# Patient Record
Sex: Female | Born: 1940 | Race: White | Hispanic: No | Marital: Married | Smoking: Former smoker
Health system: Southern US, Community
[De-identification: ages and names within clinical notes are randomized; demographics above are authoritative.]

## PROBLEM LIST (undated history)

## (undated) DIAGNOSIS — R600 Localized edema: Secondary | ICD-10-CM

## (undated) DIAGNOSIS — N3281 Overactive bladder: Secondary | ICD-10-CM

## (undated) DIAGNOSIS — Z859 Personal history of malignant neoplasm, unspecified: Secondary | ICD-10-CM

## (undated) DIAGNOSIS — R0681 Apnea, not elsewhere classified: Secondary | ICD-10-CM

## (undated) DIAGNOSIS — G2581 Restless legs syndrome: Secondary | ICD-10-CM

## (undated) DIAGNOSIS — N393 Stress incontinence (female) (male): Secondary | ICD-10-CM

## (undated) DIAGNOSIS — R011 Cardiac murmur, unspecified: Secondary | ICD-10-CM

## (undated) DIAGNOSIS — I739 Peripheral vascular disease, unspecified: Secondary | ICD-10-CM

## (undated) DIAGNOSIS — I872 Venous insufficiency (chronic) (peripheral): Secondary | ICD-10-CM

## (undated) DIAGNOSIS — N840 Polyp of corpus uteri: Secondary | ICD-10-CM

## (undated) DIAGNOSIS — M549 Dorsalgia, unspecified: Secondary | ICD-10-CM

## (undated) DIAGNOSIS — J309 Allergic rhinitis, unspecified: Secondary | ICD-10-CM

## (undated) DIAGNOSIS — I452 Bifascicular block: Secondary | ICD-10-CM

## (undated) DIAGNOSIS — T4145XA Adverse effect of unspecified anesthetic, initial encounter: Secondary | ICD-10-CM

## (undated) DIAGNOSIS — Z9889 Other specified postprocedural states: Secondary | ICD-10-CM

## (undated) DIAGNOSIS — I10 Essential (primary) hypertension: Secondary | ICD-10-CM

## (undated) DIAGNOSIS — M1990 Unspecified osteoarthritis, unspecified site: Secondary | ICD-10-CM

## (undated) DIAGNOSIS — Z860101 Personal history of adenomatous and serrated colon polyps: Secondary | ICD-10-CM

## (undated) DIAGNOSIS — Z86010 Personal history of colonic polyps: Secondary | ICD-10-CM

## (undated) DIAGNOSIS — M79606 Pain in leg, unspecified: Secondary | ICD-10-CM

## (undated) DIAGNOSIS — I779 Disorder of arteries and arterioles, unspecified: Secondary | ICD-10-CM

## (undated) DIAGNOSIS — E785 Hyperlipidemia, unspecified: Secondary | ICD-10-CM

## (undated) DIAGNOSIS — E559 Vitamin D deficiency, unspecified: Secondary | ICD-10-CM

## (undated) DIAGNOSIS — M069 Rheumatoid arthritis, unspecified: Secondary | ICD-10-CM

## (undated) DIAGNOSIS — R0602 Shortness of breath: Secondary | ICD-10-CM

## (undated) DIAGNOSIS — T8859XA Other complications of anesthesia, initial encounter: Secondary | ICD-10-CM

## (undated) DIAGNOSIS — K219 Gastro-esophageal reflux disease without esophagitis: Secondary | ICD-10-CM

## (undated) HISTORY — PX: POSTERIOR LUMBAR FUSION: SHX6036

## (undated) HISTORY — PX: APPENDECTOMY: SHX54

## (undated) HISTORY — DX: Venous insufficiency (chronic) (peripheral): I87.2

## (undated) HISTORY — DX: Pain in leg, unspecified: M79.606

## (undated) HISTORY — DX: Rheumatoid arthritis, unspecified: M06.9

## (undated) HISTORY — DX: Disorder of arteries and arterioles, unspecified: I77.9

## (undated) HISTORY — DX: Vitamin D deficiency, unspecified: E55.9

## (undated) HISTORY — DX: Restless legs syndrome: G25.81

## (undated) HISTORY — PX: OTHER SURGICAL HISTORY: SHX169

## (undated) HISTORY — DX: Dorsalgia, unspecified: M54.9

## (undated) HISTORY — DX: Essential (primary) hypertension: I10

## (undated) HISTORY — PX: ULNAR NERVE TRANSPOSITION: SHX2595

## (undated) HISTORY — PX: TONSILLECTOMY: SUR1361

## (undated) HISTORY — DX: Cardiac murmur, unspecified: R01.1

## (undated) HISTORY — DX: Shortness of breath: R06.02

## (undated) HISTORY — DX: Hyperlipidemia, unspecified: E78.5

## (undated) HISTORY — PX: SKIN CANCER EXCISION: SHX779

## (undated) HISTORY — DX: Apnea, not elsewhere classified: R06.81

## (undated) HISTORY — PX: COLONOSCOPY: SHX174

## (undated) HISTORY — PX: CARDIAC CATHETERIZATION: SHX172

## (undated) HISTORY — DX: Peripheral vascular disease, unspecified: I73.9

---

## 1998-04-22 ENCOUNTER — Other Ambulatory Visit: Admission: RE | Admit: 1998-04-22 | Discharge: 1998-04-22 | Payer: Self-pay | Admitting: *Deleted

## 1998-06-07 ENCOUNTER — Ambulatory Visit (HOSPITAL_COMMUNITY): Admission: RE | Admit: 1998-06-07 | Discharge: 1998-06-07 | Payer: Self-pay | Admitting: Obstetrics and Gynecology

## 1999-06-01 ENCOUNTER — Encounter: Payer: Self-pay | Admitting: Emergency Medicine

## 1999-06-01 ENCOUNTER — Emergency Department (HOSPITAL_COMMUNITY): Admission: EM | Admit: 1999-06-01 | Discharge: 1999-06-01 | Payer: Self-pay | Admitting: Emergency Medicine

## 1999-06-03 ENCOUNTER — Encounter: Admission: RE | Admit: 1999-06-03 | Discharge: 1999-06-03 | Payer: Self-pay | Admitting: Internal Medicine

## 1999-06-03 ENCOUNTER — Encounter: Payer: Self-pay | Admitting: Internal Medicine

## 1999-07-28 ENCOUNTER — Other Ambulatory Visit: Admission: RE | Admit: 1999-07-28 | Discharge: 1999-07-28 | Payer: Self-pay | Admitting: Obstetrics and Gynecology

## 2002-01-31 ENCOUNTER — Encounter: Payer: Self-pay | Admitting: General Surgery

## 2002-01-31 ENCOUNTER — Encounter: Admission: RE | Admit: 2002-01-31 | Discharge: 2002-01-31 | Payer: Self-pay | Admitting: General Surgery

## 2002-02-02 ENCOUNTER — Encounter (INDEPENDENT_AMBULATORY_CARE_PROVIDER_SITE_OTHER): Payer: Self-pay | Admitting: *Deleted

## 2002-02-02 ENCOUNTER — Ambulatory Visit (HOSPITAL_BASED_OUTPATIENT_CLINIC_OR_DEPARTMENT_OTHER): Admission: RE | Admit: 2002-02-02 | Discharge: 2002-02-02 | Payer: Self-pay | Admitting: General Surgery

## 2004-11-24 ENCOUNTER — Ambulatory Visit: Payer: Self-pay | Admitting: Internal Medicine

## 2004-11-28 ENCOUNTER — Ambulatory Visit: Payer: Self-pay | Admitting: Internal Medicine

## 2005-01-22 ENCOUNTER — Ambulatory Visit: Payer: Self-pay | Admitting: Internal Medicine

## 2005-01-23 ENCOUNTER — Ambulatory Visit: Payer: Self-pay | Admitting: Internal Medicine

## 2005-07-02 ENCOUNTER — Encounter (INDEPENDENT_AMBULATORY_CARE_PROVIDER_SITE_OTHER): Payer: Self-pay | Admitting: Specialist

## 2005-07-03 ENCOUNTER — Inpatient Hospital Stay (HOSPITAL_COMMUNITY): Admission: EM | Admit: 2005-07-03 | Discharge: 2005-07-04 | Payer: Self-pay | Admitting: Emergency Medicine

## 2005-10-28 ENCOUNTER — Ambulatory Visit: Payer: Self-pay | Admitting: Internal Medicine

## 2005-12-23 ENCOUNTER — Ambulatory Visit: Payer: Self-pay | Admitting: Internal Medicine

## 2006-05-19 ENCOUNTER — Ambulatory Visit: Payer: Self-pay | Admitting: Internal Medicine

## 2006-05-19 LAB — CONVERTED CEMR LAB
ALT: 34 units/L (ref 0–40)
AST: 24 units/L (ref 0–37)
Creatinine, Ser: 0.6 mg/dL (ref 0.4–1.2)
Potassium: 4 meq/L (ref 3.5–5.1)

## 2006-06-03 ENCOUNTER — Ambulatory Visit: Payer: Self-pay | Admitting: Internal Medicine

## 2006-08-02 ENCOUNTER — Other Ambulatory Visit: Admission: RE | Admit: 2006-08-02 | Discharge: 2006-08-02 | Payer: Self-pay | Admitting: Obstetrics and Gynecology

## 2006-11-22 ENCOUNTER — Ambulatory Visit: Payer: Self-pay | Admitting: Internal Medicine

## 2006-11-22 DIAGNOSIS — R0989 Other specified symptoms and signs involving the circulatory and respiratory systems: Secondary | ICD-10-CM

## 2006-11-22 DIAGNOSIS — I872 Venous insufficiency (chronic) (peripheral): Secondary | ICD-10-CM | POA: Insufficient documentation

## 2006-11-22 DIAGNOSIS — R0609 Other forms of dyspnea: Secondary | ICD-10-CM | POA: Insufficient documentation

## 2006-11-22 DIAGNOSIS — K219 Gastro-esophageal reflux disease without esophagitis: Secondary | ICD-10-CM | POA: Insufficient documentation

## 2006-12-13 ENCOUNTER — Ambulatory Visit: Payer: Self-pay

## 2006-12-31 ENCOUNTER — Telehealth (INDEPENDENT_AMBULATORY_CARE_PROVIDER_SITE_OTHER): Payer: Self-pay | Admitting: *Deleted

## 2007-01-01 ENCOUNTER — Ambulatory Visit: Payer: Self-pay | Admitting: Internal Medicine

## 2007-01-01 ENCOUNTER — Encounter: Payer: Self-pay | Admitting: Internal Medicine

## 2007-01-27 ENCOUNTER — Ambulatory Visit: Payer: Self-pay | Admitting: Internal Medicine

## 2007-01-31 ENCOUNTER — Encounter (INDEPENDENT_AMBULATORY_CARE_PROVIDER_SITE_OTHER): Payer: Self-pay | Admitting: *Deleted

## 2007-01-31 LAB — CONVERTED CEMR LAB
ALT: 28 units/L (ref 0–35)
AST: 25 units/L (ref 0–37)
Total CHOL/HDL Ratio: 3.7

## 2007-05-09 ENCOUNTER — Telehealth (INDEPENDENT_AMBULATORY_CARE_PROVIDER_SITE_OTHER): Payer: Self-pay | Admitting: *Deleted

## 2007-05-10 ENCOUNTER — Ambulatory Visit: Payer: Self-pay | Admitting: Internal Medicine

## 2007-05-13 ENCOUNTER — Encounter (INDEPENDENT_AMBULATORY_CARE_PROVIDER_SITE_OTHER): Payer: Self-pay | Admitting: *Deleted

## 2007-05-13 LAB — CONVERTED CEMR LAB: Hemoglobin: 13.7 g/dL (ref 12.0–15.0)

## 2007-06-14 ENCOUNTER — Encounter: Payer: Self-pay | Admitting: Internal Medicine

## 2007-06-21 ENCOUNTER — Telehealth: Payer: Self-pay | Admitting: Internal Medicine

## 2007-06-22 ENCOUNTER — Telehealth (INDEPENDENT_AMBULATORY_CARE_PROVIDER_SITE_OTHER): Payer: Self-pay | Admitting: *Deleted

## 2007-07-20 ENCOUNTER — Telehealth (INDEPENDENT_AMBULATORY_CARE_PROVIDER_SITE_OTHER): Payer: Self-pay | Admitting: *Deleted

## 2007-07-20 ENCOUNTER — Emergency Department (HOSPITAL_COMMUNITY): Admission: EM | Admit: 2007-07-20 | Discharge: 2007-07-20 | Payer: Self-pay | Admitting: Family Medicine

## 2007-08-03 ENCOUNTER — Encounter: Payer: Self-pay | Admitting: Internal Medicine

## 2007-09-05 ENCOUNTER — Encounter: Payer: Self-pay | Admitting: Internal Medicine

## 2007-11-21 ENCOUNTER — Telehealth: Payer: Self-pay | Admitting: Gastroenterology

## 2007-11-21 ENCOUNTER — Ambulatory Visit: Payer: Self-pay | Admitting: Internal Medicine

## 2007-11-21 ENCOUNTER — Encounter (INDEPENDENT_AMBULATORY_CARE_PROVIDER_SITE_OTHER): Payer: Self-pay | Admitting: *Deleted

## 2007-11-21 DIAGNOSIS — J45909 Unspecified asthma, uncomplicated: Secondary | ICD-10-CM | POA: Insufficient documentation

## 2007-11-22 ENCOUNTER — Encounter (INDEPENDENT_AMBULATORY_CARE_PROVIDER_SITE_OTHER): Payer: Self-pay | Admitting: *Deleted

## 2007-11-24 ENCOUNTER — Encounter: Payer: Self-pay | Admitting: Internal Medicine

## 2007-12-02 ENCOUNTER — Encounter: Payer: Self-pay | Admitting: Internal Medicine

## 2007-12-20 ENCOUNTER — Telehealth: Payer: Self-pay | Admitting: Internal Medicine

## 2008-01-11 ENCOUNTER — Telehealth (INDEPENDENT_AMBULATORY_CARE_PROVIDER_SITE_OTHER): Payer: Self-pay | Admitting: *Deleted

## 2008-03-13 ENCOUNTER — Telehealth (INDEPENDENT_AMBULATORY_CARE_PROVIDER_SITE_OTHER): Payer: Self-pay | Admitting: *Deleted

## 2008-05-10 ENCOUNTER — Encounter: Payer: Self-pay | Admitting: Internal Medicine

## 2008-05-17 ENCOUNTER — Encounter: Payer: Self-pay | Admitting: Internal Medicine

## 2008-05-22 ENCOUNTER — Telehealth (INDEPENDENT_AMBULATORY_CARE_PROVIDER_SITE_OTHER): Payer: Self-pay | Admitting: *Deleted

## 2008-06-14 ENCOUNTER — Encounter: Payer: Self-pay | Admitting: Internal Medicine

## 2008-06-29 ENCOUNTER — Telehealth (INDEPENDENT_AMBULATORY_CARE_PROVIDER_SITE_OTHER): Payer: Self-pay | Admitting: *Deleted

## 2008-07-04 ENCOUNTER — Telehealth (INDEPENDENT_AMBULATORY_CARE_PROVIDER_SITE_OTHER): Payer: Self-pay | Admitting: *Deleted

## 2008-07-05 ENCOUNTER — Ambulatory Visit: Payer: Self-pay | Admitting: Internal Medicine

## 2008-07-06 ENCOUNTER — Encounter (INDEPENDENT_AMBULATORY_CARE_PROVIDER_SITE_OTHER): Payer: Self-pay | Admitting: *Deleted

## 2008-07-31 ENCOUNTER — Encounter: Admission: RE | Admit: 2008-07-31 | Discharge: 2008-07-31 | Payer: Self-pay | Admitting: Obstetrics and Gynecology

## 2008-08-28 ENCOUNTER — Telehealth (INDEPENDENT_AMBULATORY_CARE_PROVIDER_SITE_OTHER): Payer: Self-pay | Admitting: *Deleted

## 2008-09-28 ENCOUNTER — Telehealth (INDEPENDENT_AMBULATORY_CARE_PROVIDER_SITE_OTHER): Payer: Self-pay | Admitting: *Deleted

## 2008-10-25 ENCOUNTER — Encounter: Payer: Self-pay | Admitting: Internal Medicine

## 2008-10-30 ENCOUNTER — Ambulatory Visit: Payer: Self-pay | Admitting: Internal Medicine

## 2008-10-30 DIAGNOSIS — E1169 Type 2 diabetes mellitus with other specified complication: Secondary | ICD-10-CM | POA: Insufficient documentation

## 2008-10-30 DIAGNOSIS — C4490 Unspecified malignant neoplasm of skin, unspecified: Secondary | ICD-10-CM | POA: Insufficient documentation

## 2008-10-30 DIAGNOSIS — E785 Hyperlipidemia, unspecified: Secondary | ICD-10-CM | POA: Insufficient documentation

## 2008-10-30 DIAGNOSIS — R32 Unspecified urinary incontinence: Secondary | ICD-10-CM | POA: Insufficient documentation

## 2008-10-30 DIAGNOSIS — Z85828 Personal history of other malignant neoplasm of skin: Secondary | ICD-10-CM | POA: Insufficient documentation

## 2008-11-01 ENCOUNTER — Encounter: Payer: Self-pay | Admitting: Internal Medicine

## 2008-11-02 ENCOUNTER — Ambulatory Visit: Payer: Self-pay | Admitting: Internal Medicine

## 2008-11-12 LAB — CONVERTED CEMR LAB
AST: 25 units/L (ref 0–37)
Alkaline Phosphatase: 73 units/L (ref 39–117)
BUN: 13 mg/dL (ref 6–23)
Basophils Relative: 0.2 % (ref 0.0–3.0)
Bilirubin, Direct: 0 mg/dL (ref 0.0–0.3)
CO2: 28 meq/L (ref 19–32)
Calcium: 8.8 mg/dL (ref 8.4–10.5)
Cholesterol: 201 mg/dL — ABNORMAL HIGH (ref 0–200)
Creatinine, Ser: 0.6 mg/dL (ref 0.4–1.2)
Direct LDL: 130.5 mg/dL
Eosinophils Relative: 4.5 % (ref 0.0–5.0)
Glucose, Bld: 103 mg/dL — ABNORMAL HIGH (ref 70–99)
HCT: 38.4 % (ref 36.0–46.0)
Lymphs Abs: 1.3 10*3/uL (ref 0.7–4.0)
Monocytes Relative: 6.3 % (ref 3.0–12.0)
Neutro Abs: 4.2 10*3/uL (ref 1.4–7.7)
Platelets: 223 10*3/uL (ref 150.0–400.0)
Total Bilirubin: 0.9 mg/dL (ref 0.3–1.2)
Total CHOL/HDL Ratio: 3

## 2008-11-16 ENCOUNTER — Encounter (INDEPENDENT_AMBULATORY_CARE_PROVIDER_SITE_OTHER): Payer: Self-pay | Admitting: *Deleted

## 2008-11-27 ENCOUNTER — Ambulatory Visit: Payer: Self-pay | Admitting: Vascular Surgery

## 2008-12-17 ENCOUNTER — Encounter: Payer: Self-pay | Admitting: Internal Medicine

## 2008-12-20 ENCOUNTER — Ambulatory Visit (HOSPITAL_BASED_OUTPATIENT_CLINIC_OR_DEPARTMENT_OTHER): Admission: RE | Admit: 2008-12-20 | Discharge: 2008-12-20 | Payer: Self-pay | Admitting: Orthopedic Surgery

## 2008-12-27 ENCOUNTER — Telehealth: Payer: Self-pay | Admitting: Internal Medicine

## 2008-12-28 ENCOUNTER — Encounter: Payer: Self-pay | Admitting: Internal Medicine

## 2009-01-04 ENCOUNTER — Encounter: Payer: Self-pay | Admitting: Internal Medicine

## 2009-01-21 ENCOUNTER — Encounter: Payer: Self-pay | Admitting: Cardiovascular Disease

## 2009-01-21 ENCOUNTER — Telehealth (INDEPENDENT_AMBULATORY_CARE_PROVIDER_SITE_OTHER): Payer: Self-pay | Admitting: *Deleted

## 2009-01-21 ENCOUNTER — Ambulatory Visit: Payer: Self-pay | Admitting: Cardiovascular Disease

## 2009-01-21 ENCOUNTER — Observation Stay (HOSPITAL_COMMUNITY): Admission: EM | Admit: 2009-01-21 | Discharge: 2009-01-23 | Payer: Self-pay | Admitting: Emergency Medicine

## 2009-01-21 HISTORY — PX: TRANSTHORACIC ECHOCARDIOGRAM: SHX275

## 2009-02-11 DIAGNOSIS — R609 Edema, unspecified: Secondary | ICD-10-CM | POA: Insufficient documentation

## 2009-02-11 DIAGNOSIS — G2581 Restless legs syndrome: Secondary | ICD-10-CM | POA: Insufficient documentation

## 2009-02-19 ENCOUNTER — Ambulatory Visit: Payer: Self-pay | Admitting: Cardiovascular Disease

## 2009-02-19 DIAGNOSIS — R072 Precordial pain: Secondary | ICD-10-CM | POA: Insufficient documentation

## 2009-03-06 ENCOUNTER — Encounter: Payer: Self-pay | Admitting: Internal Medicine

## 2009-06-07 ENCOUNTER — Ambulatory Visit: Payer: Self-pay | Admitting: Internal Medicine

## 2009-06-10 LAB — CONVERTED CEMR LAB
Cholesterol: 183 mg/dL (ref 0–200)
HDL: 64.2 mg/dL (ref >39.00)
LDL Cholesterol: 95 mg/dL (ref 0–99)
Total CHOL/HDL Ratio: 3
Triglycerides: 119 mg/dL (ref 0.0–149.0)
VLDL: 23.8 mg/dL (ref 0.0–40.0)

## 2009-10-10 ENCOUNTER — Encounter: Payer: Self-pay | Admitting: Internal Medicine

## 2009-10-21 ENCOUNTER — Telehealth: Payer: Self-pay | Admitting: Cardiovascular Disease

## 2009-10-25 ENCOUNTER — Telehealth (INDEPENDENT_AMBULATORY_CARE_PROVIDER_SITE_OTHER): Payer: Self-pay | Admitting: *Deleted

## 2009-10-30 ENCOUNTER — Ambulatory Visit: Payer: Self-pay | Admitting: Internal Medicine

## 2009-10-30 DIAGNOSIS — R5381 Other malaise: Secondary | ICD-10-CM | POA: Insufficient documentation

## 2009-10-30 DIAGNOSIS — R5383 Other fatigue: Secondary | ICD-10-CM | POA: Insufficient documentation

## 2009-11-05 ENCOUNTER — Telehealth (INDEPENDENT_AMBULATORY_CARE_PROVIDER_SITE_OTHER): Payer: Self-pay | Admitting: *Deleted

## 2009-11-05 LAB — CONVERTED CEMR LAB
AST: 68 units/L — ABNORMAL HIGH (ref 0–37)
Alkaline Phosphatase: 69 units/L (ref 39–117)
Basophils Absolute: 0 10*3/uL (ref 0.0–0.1)
Calcium: 8.6 mg/dL (ref 8.4–10.5)
GFR calc non Af Amer: 84.57 mL/min (ref >60)
Glucose, Bld: 132 mg/dL — ABNORMAL HIGH (ref 70–99)
Hemoglobin: 12.3 g/dL (ref 12.0–15.0)
Lymphocytes Relative: 62.8 % — ABNORMAL HIGH (ref 12.0–46.0)
Monocytes Relative: 11.3 % (ref 3.0–12.0)
Neutro Abs: 0.7 10*3/uL — ABNORMAL LOW (ref 1.4–7.7)
Platelets: 202 10*3/uL (ref 150.0–400.0)
RDW: 12.6 % (ref 11.5–14.6)
Sodium: 141 meq/L (ref 135–145)
Total Bilirubin: 0.9 mg/dL (ref 0.3–1.2)
WBC: 3.6 10*3/uL — ABNORMAL LOW (ref 4.5–10.5)

## 2009-11-06 ENCOUNTER — Telehealth (INDEPENDENT_AMBULATORY_CARE_PROVIDER_SITE_OTHER): Payer: Self-pay | Admitting: *Deleted

## 2009-11-06 ENCOUNTER — Ambulatory Visit: Payer: Self-pay | Admitting: Internal Medicine

## 2009-11-07 LAB — CONVERTED CEMR LAB
ALT: 27 units/L (ref 0–35)
AST: 14 units/L (ref 0–37)
Alkaline Phosphatase: 67 units/L (ref 39–117)
Basophils Absolute: 0.1 10*3/uL (ref 0.0–0.1)
Bilirubin, Direct: 0.1 mg/dL (ref 0.0–0.3)
Eosinophils Relative: 2.7 % (ref 0.0–5.0)
HCT: 36.6 % (ref 36.0–46.0)
Iron: 35 ug/dL — ABNORMAL LOW (ref 42–145)
Lymphs Abs: 2.2 10*3/uL (ref 0.7–4.0)
MCV: 94.1 fL (ref 78.0–100.0)
Monocytes Absolute: 0.9 10*3/uL (ref 0.1–1.0)
Platelets: 271 10*3/uL (ref 150.0–400.0)
RDW: 13 % (ref 11.5–14.6)
Total Protein: 6.5 g/dL (ref 6.0–8.3)

## 2009-11-08 LAB — CONVERTED CEMR LAB: Hep B C IgM: NEGATIVE

## 2009-11-18 ENCOUNTER — Ambulatory Visit: Payer: Self-pay | Admitting: Internal Medicine

## 2009-11-18 DIAGNOSIS — R251 Tremor, unspecified: Secondary | ICD-10-CM | POA: Insufficient documentation

## 2009-11-18 DIAGNOSIS — G252 Other specified forms of tremor: Secondary | ICD-10-CM

## 2009-11-18 DIAGNOSIS — G250 Essential tremor: Secondary | ICD-10-CM | POA: Insufficient documentation

## 2010-01-15 ENCOUNTER — Ambulatory Visit: Payer: Self-pay | Admitting: Internal Medicine

## 2010-01-15 DIAGNOSIS — R030 Elevated blood-pressure reading, without diagnosis of hypertension: Secondary | ICD-10-CM | POA: Insufficient documentation

## 2010-02-25 ENCOUNTER — Ambulatory Visit: Payer: Self-pay | Admitting: Internal Medicine

## 2010-03-03 LAB — CONVERTED CEMR LAB
AST: 21 units/L (ref 0–37)
Albumin: 3.6 g/dL (ref 3.5–5.2)
Cholesterol: 329 mg/dL — ABNORMAL HIGH (ref 0–200)
Direct LDL: 240.9 mg/dL
Total CHOL/HDL Ratio: 6
Total Protein: 6.2 g/dL (ref 6.0–8.3)
Triglycerides: 180 mg/dL — ABNORMAL HIGH (ref 0.0–149.0)

## 2010-03-10 ENCOUNTER — Telehealth: Payer: Self-pay | Admitting: Internal Medicine

## 2010-03-13 ENCOUNTER — Ambulatory Visit: Payer: Self-pay | Admitting: Internal Medicine

## 2010-04-11 ENCOUNTER — Telehealth: Payer: Self-pay | Admitting: Internal Medicine

## 2010-06-03 ENCOUNTER — Encounter: Payer: Self-pay | Admitting: Internal Medicine

## 2010-06-08 LAB — CONVERTED CEMR LAB
Basophils Absolute: 0.1 10*3/uL (ref 0.0–0.1)
CK-MB: 3.5 ng/mL (ref 0.3–4.0)
Eosinophils Absolute: 0.3 10*3/uL (ref 0.0–0.7)
HCT: 41.3 % (ref 36.0–46.0)
Monocytes Absolute: 0.6 10*3/uL (ref 0.1–1.0)
Monocytes Relative: 5.9 % (ref 3.0–12.0)
Platelets: 248 10*3/uL (ref 150–400)
RDW: 12.2 % (ref 11.5–14.6)
Total CK: 43 units/L (ref 7–177)
Troponin I: 0.02 ng/mL (ref <0.06)

## 2010-06-12 ENCOUNTER — Telehealth: Payer: Self-pay | Admitting: Internal Medicine

## 2010-06-12 NOTE — Progress Notes (Signed)
Summary: Questions on Medication  Phone Note Call from Patient Message from:  Pharmacy on April 11, 2010 11:31 AM  Caller: Patient Summary of Call: Patient called and LM on triage VM stating that yall had made some changes to her cholesterol medication recently and she noticed that she is no longer on a diuretic. She was previously on Lasix. She would like to know what she needs to be doing and and if she needs to back on the duretic. Her pharmacy is [facility] on [address]. Please advise.  Initial call taken by: Harold Barban,  April 11, 2010 11:35 AM  Follow-up for Phone Call        Spironolactone is a diuretic Follow-up by: Marga Melnick MD,  April 11, 2010 1:22 PM  Additional Follow-up for Phone Call Additional follow up Details #1::        Patient notified. Additional Follow-up by: Lucious Groves CMA,  April 11, 2010 1:42 PM

## 2010-06-12 NOTE — Assessment & Plan Note (Signed)
Summary: BP SUDDENLY RUNNING HIGH/RH......   Vital Signs:  Patient profile:   70 year old female Weight:      187.4 pounds Temp:     98.7 degrees F oral Pulse rate:   84 / minute Resp:     15 per minute BP sitting:   138 / 88  (left arm) Cuff size:   large  Vitals Entered By: Shonna Chock CMA (January 15, 2010 1:37 PM) CC: B/P concerns (elevated)   Primary Care Provider:  Marga Melnick MD  CC:  B/P concerns (elevated).  History of Present Illness:      This is a 70 year old woman who presents for possible  Hypertension . The patient denies lightheadedness, urinary frequency, headaches, edema, and fatigue.  The patient denies the following associated symptoms: chest pain, chest pressure, exercise intolerance, dyspnea, palpitations, and syncope.  The patient reports that dietary compliance has been fair.  The patient reports exercising occasionally.  Adjunctive measures currently used by the patient do not  include salt restriction.  Systolic BP was 156-158 @ beach.   Current Medications (verified): 1)  Vesicare 10 Mg  Tabs (Solifenacin Succinate) .[name].. 1 Once Daily 2)  Symbicort 160-4.5 Mcg/act  Aero (Budesonide-Formoterol Fumarate) 3)  Furosemide 40 Mg  Tabs (Furosemide) .[name].. 1 By Mouth Bid 4)  Allergy Shots 5)  Requip 2 Mg  Tabs (Ropinirole Hcl) .... 4 Daily (1-Am, 1,lunch,2-Evening) 6)  Nexium 40 Mg  Cpdr (Esomeprazole Magnesium) .[name].. 1 Two Times A Day 7)  Aspirin Ec 325 Mg Tbec (Aspirin) .... Take One Tablet By Mouth Daily 8)  Magnesium 250 Mg Tabs (Magnesium) .[name].. 1 By Mouth Once Daily 9)  Calcium Plus Vit D .... 1 By Mouth Once Daily  Allergies (verified): No Known Drug Allergies  Physical Exam  General:  well-nourished;alert,appropriate and cooperative throughout examination Lungs:  Normal respiratory effort, chest expands symmetrically. Lungs are clear to auscultation, no crackles or wheezes. Heart:  Normal rate and regular rhythm. S1 and S2 normal without gallop,  murmur, click, rub.S4 Pulses:  R and L carotid,radial,dorsalis pedis and posterior tibial pulses are full and equal bilaterally Extremities:  1/2 + left pedal edema and 1/2 + right pedal edema.     Impression & Recommendations:  Problem # 1:  ELEVATED BLOOD PRESSURE WITHOUT DIAGNOSIS OF HYPERTENSION (ICD-796.2)  The following medications were removed from the medication list:    Furosemide 40 Mg Tabs (Furosemide) .[name]... 1 by mouth bid Her updated medication list for this problem includes:    Spironolactone 25 Mg Tabs (Spironolactone) .[name]... 1 two times a day  Problem # 2:  LEG EDEMA, BILATERAL (ICD-782.3)  The following medications were removed from the medication list:    Furosemide 40 Mg Tabs (Furosemide) .[name]... 1 by mouth bid Her updated medication list for this problem includes:    Spironolactone 25 Mg Tabs (Spironolactone) .[name]... 1 two times a day  Complete Medication List: 1)  Vesicare 10 Mg Tabs (Solifenacin succinate) .[name].. 1 once daily 2)  Symbicort 160-4.5 Mcg/act Aero (Budesonide-formoterol fumarate) 3)  Allergy Shots  4)  Requip 2 Mg Tabs (Ropinirole hcl) .... 4 daily (1-am, 1,lunch,2-evening) 5)  Nexium 40 Mg Cpdr (Esomeprazole magnesium) .[name].. 1 two times a day 6)  Aspirin Ec 325 Mg Tbec (Aspirin) .... Take one tablet by mouth daily 7)  Magnesium 250 Mg Tabs (Magnesium) .[name].. 1 by mouth once daily 8)  Calcium Plus Vit D  .... 1 by mouth once daily 9)  Spironolactone 25  Mg Tabs (Spironolactone) .[name].. 1 two times a day  Patient Instructions: 1)  Limit your Sodium (Salt) to less than 4 grams a day (slightly less than 1 teaspoon) to prevent fluid retention, swelling, or worsening or symptoms. 2)  Check your Blood Pressure regularly.Your goal= AVERAGE < 135/85. Prescriptions: SPIRONOLACTONE 25 MG TABS (SPIRONOLACTONE) 1 two times a day  #60 x 5   Entered and Authorized by:   Marga Melnick MD   Signed by:   Marga Melnick MD on 01/15/2010   Method used:   Print then Give to  Patient   RxID:   [id number]

## 2010-06-12 NOTE — Progress Notes (Signed)
Summary: REFILL REQUEST  Phone Note Refill Request Call back at [phone] Message from:  Pharmacy on October 25, 2009 1:42 PM  Refills Requested: Medication #1:  VESICARE 10 MG  TABS 1 once daily   Dosage confirmed as above?Dosage Confirmed   Supply Requested: 1 month [address]  Next Appointment Scheduled: NONE Initial call taken by: Lavell Islam,  October 25, 2009 1:43 PM    New/Updated Medications: VESICARE 10 MG  TABS (SOLIFENACIN SUCCINATE) 1 once daily **APPOINTMENT DUE FOR ADDITIONAL REFILLS** Prescriptions: VESICARE 10 MG  TABS (SOLIFENACIN SUCCINATE) 1 once daily **APPOINTMENT DUE FOR ADDITIONAL REFILLS**  #30 x 0   Entered by:   Shonna Chock   Authorized by:   Marga Melnick MD   Signed by:   Shonna Chock on 10/25/2009   Method used:   Electronically to        [facility]* (retail)       [address]       [place], [place]  [address]       Ph: [phone]       Fax: [phone]   RxID:   [id number]

## 2010-06-12 NOTE — Progress Notes (Signed)
Summary: pt has a letter from [employer] Note Call from Patient Call back at Home Phone [phone]   Caller: Patient Reason for Call: Talk to Nurse, Talk to Doctor Summary of Call: pt got a letter from [facility] and they are saying the need Dr. Clifton James  about nexeum and the number he needs to call is [phone] Initial call taken by: Omer Jack,  October 21, 2009 4:34 PM  Follow-up for Phone Call        Prior authorization form completed and faxed to [facility]. Pt made aware. Follow-up by: Dossie Arbour, RN, BSN,  October 21, 2009 4:57 PM

## 2010-06-12 NOTE — Assessment & Plan Note (Signed)
Summary: FEELING TIRED//KN   Vital Signs:  Patient profile:   70 year old female Weight:      186 pounds Pulse rate:   72 / minute Pulse rhythm:   regular Resp:     17 per minute BP sitting:   118 / 80  (left arm) Cuff size:   large  Vitals Entered By: Army Fossa CMA (October 30, 2009 1:32 PM) CC: Pt here, exhausted all the time. Having nauseas spells thinks its coming from Requip., Fatigue   Primary Care Provider:  Marga Melnick MD  CC:  Pt here, exhausted all the time. Having nauseas spells thinks its coming from Requip., and Fatigue.  History of Present Illness:  Fatigue      This is a 70 year old woman who presents with Fatigue for 6 weeks.  The patient reports persistent fatigue, fatigue without physical actvity & weight loss  of 3-5 #, dyspnea( DOE with  asthma exacerbation with heat), and  NP cough.  The patient denies fever, exertional chest pain, hemoptysis, and new medications.  Other symptoms include leg swelling which is chronic , some snoring, and daytime sleepiness.  The patient denies the following symptoms: orthopnea, PND, melena, adenopathy, and skin changes.  Requip drug for RLS has caused nausea &  altered appetite .She describes  poor sleep due to RLS & husband's  post op ( surgery for quadriceps tear 6 weeks ago )orthopedic  issues.  The patient denies feeling depressed.    Allergies (verified): No Known Drug Allergies  Past History:  Past Medical History: VENOUS INSUFFICIENCY, LEGS (ICD-459.81) LEG EDEMA, BILATERAL (ICD-782.3), venous insufficiency RESTLESS LEG SYNDROME (ICD-333.94) HYPERLIPIDEMIA (ICD-272.4) REFLUX, ESOPHAGEAL (ICD-530.81) URINARY INCONTINENCE (ICD-788.30) SKIN CANCER, HX OF (ICD-V10.83)hx of,squamous cell buttocks area, Bernerd Pho MD,Gen Surgery; Amy Swaziland MD, Derm ASTHMA (ICD-493.90) SNORING [phone])  Past Surgical History: G2 P2 Appendectomy Tonsillectomy Colonoscopy negative  2009 Skin cancer removal Ulnar nerve  surgery.         Review of Systems Eyes:  Denies blurring, discharge, double vision, eye pain, and vision loss-both eyes. ENT:  Denies difficulty swallowing and hoarseness. CV:  Occasional palpitations. Resp:  Complains of morning headaches; No apnea described by husband. GI:  Complains of constipation and diarrhea; denies abdominal pain and bloody stools; Inetermittent alternating stool changes . No dysphagia. MS:  Denies joint pain, joint redness, joint swelling, low back pain, mid back pain, and thoracic pain. Derm:  Denies changes in nail beds and hair loss. Neuro:  Denies numbness and tingling. Endo:  Complains of heat intolerance; denies cold intolerance. Allergy:  Denies itching eyes and sneezing; Dr Corinda Gubler Rxs allergy shots with benefit.  Physical Exam  General:  well-nourished,in no acute distress; alert,appropriate and cooperative throughout examination Eyes:  Minor OS scleral  inflammation noted. No icterus. Perrla.No lid lag  Mouth:  Oral mucosa and oropharynx without lesions or exudates.  Teeth in good repair. No pharyngeal erythema.   Neck:  No deformities, masses, or tenderness noted. Lungs:  Normal respiratory effort, chest expands symmetrically. Lungs are clear to auscultation, no crackles or wheezes. Dry cough when supine Heart:  normal rate, regular rhythm, no gallop, no rub, no JVD, no HJR, and grade 1 /6 systolic murmur R base.   Abdomen:  Bowel sounds positive,abdomen soft and non-tender without masses, organomegaly or hernias noted. Pulses:  R and L carotid,radial,dorsalis pedis and posterior tibial pulses are full and equal bilaterally Extremities:  No clubbing, cyanosis. trace left pedal edema and trace right pedal edema.  Neurologic:  alert & oriented X3, strength normal in all extremities, gait normal, and DTRs symmetrical and normal.   Skin:  Intact without suspicious lesions or rashes. No jaundice Cervical Nodes:  No lymphadenopathy noted Axillary Nodes:   No palpable lymphadenopathy Psych:  memory intact for recent and remote, normally interactive, good eye contact, not anxious appearing, and not depressed appearing.     Impression & Recommendations:  Problem # 1:  FATIGUE (ICD-780.79)  Orders: Venipuncture (44010) TLB-BMP (Basic Metabolic Panel-BMET) (80048-METABOL) TLB-CBC Platelet - w/Differential (85025-CBCD) TLB-Hepatic/Liver Function Pnl (80076-HEPATIC) TLB-TSH (Thyroid Stimulating Hormone) (84443-TSH)  Problem # 2:  SNORING (ICD-786.09) R/O Sleep Apnea Her updated medication list for this problem includes:    Singulair 10 Mg Tabs (Montelukast sodium)    Symbicort 160-4.5 Mcg/act Aero (Budesonide-formoterol fumarate)    Furosemide 40 Mg Tabs (Furosemide) .[name]... 1 by mouth bid  Problem # 3:  RESTLESS LEG SYNDROME (ICD-333.94)  Problem # 4:  ASTHMA (ICD-493.90)  Her updated medication list for this problem includes:    Singulair 10 Mg Tabs (Montelukast sodium)    Symbicort 160-4.5 Mcg/act Aero (Budesonide-formoterol fumarate)  Problem # 5:  REFLUX, ESOPHAGEAL (ICD-530.81)  Her updated medication list for this problem includes:    Nexium 40 Mg Cpdr (Esomeprazole magnesium) .[name]... 1 two times a day    Dicyclomine Hcl 20 Mg Tabs (Dicyclomine hcl) .[name]... 1 q 6 hrs as needed for pain  Orders: TLB-CBC Platelet - w/Differential (85025-CBCD)  Complete Medication List: 1)  Simvastatin 20 Mg Tabs (Simvastatin) .[name].. 1 tab at bedtime 2)  Vesicare 10 Mg Tabs (Solifenacin succinate) .[name].. 1 once daily **appointment due for additional refills** 3)  Singulair 10 Mg Tabs (Montelukast sodium) 4)  Symbicort 160-4.5 Mcg/act Aero (Budesonide-formoterol fumarate) 5)  Furosemide 40 Mg Tabs (Furosemide) .[name].. 1 by mouth bid 6)  Allergy Shots  7)  Requip 2 Mg Tabs (Ropinirole hcl) .... 4 daily (1-am, 1,lunch,2-evening) 8)  Nexium 40 Mg Cpdr (Esomeprazole magnesium) .[name].. 1 two times a day 9)  Dicyclomine Hcl 20 Mg Tabs (Dicyclomine hcl) .[name].. 1 q  6 hrs as needed for pain 10)  Aspirin Ec 325 Mg Tbec (Aspirin) .... Take one tablet by mouth daily  Patient Instructions: 1)  Complete stool cards.Avoid foods high in acid (tomatoes, citrus juices, spicy foods). Avoid eating within two hours of lying down or before exercising. Do not over eat; try smaller more frequent meals. Elevate head of bed twelve inches when sleeping.Sleep Apnea evaluation if labs are negative.

## 2010-06-12 NOTE — Assessment & Plan Note (Signed)
Summary: F/U on Labs/scm   Vital Signs:  Patient profile:   70 year old female Weight:      183.2 pounds BMI:     29.23 Pulse rate:   80 / minute Resp:     14 per minute BP sitting:   122 / 80  (left arm) Cuff size:   large  Vitals Entered By: Shonna Chock (November 18, 2009 3:09 PM) CC: Follow-up visit: Discuss Labs, refill Vesicare, Discuss chlosterol med  Comments REVIEWED MED LIST, PATIENT AGREED DOSE AND INSTRUCTION CORRECT    Primary Care Provider:  Marga Melnick MD  CC:  Follow-up visit: Discuss Labs, refill Vesicare, and Discuss chlosterol med .  History of Present Illness: She has not restarted Simvastatin yet; elevation of LFTs have resolved. Labs reviewed & risks discussed. Tremor holding items tightly; her mother & MGGF had tremors. No FH Parkinson's.  Allergies (verified): No Known Drug Allergies  Review of Systems General:  Denies chills and fever; Fatigue improved. Neuro:  Complains of tremors; denies disturbances in coordination, numbness, poor balance, and tingling.  Physical Exam  General:  well-nourished; alert,appropriate and cooperative throughout examination Eyes:  No lid lag Neck:  No deformities, masses, or tenderness noted. Lungs:  Normal respiratory effort, chest expands symmetrically. Lungs are clear to auscultation, no crackles or wheezes. Heart:  Normal rate and regular rhythm. S1 and S2 normal without gallop, murmur, click, rub .S4 Neurologic:  alert & oriented X3 and DTRs symmetrical and normal.  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 1 tab at bedtime  Complete Medication List: 1)  Simvastatin 20 Mg Tabs (Simvastatin) .[name].. 1 tab at bedtime 2)  Vesicare  10 Mg Tabs (Solifenacin succinate) .[name].. 1 once daily **appointment due for additional refills** 3)  Symbicort 160-4.5 Mcg/act Aero (Budesonide-formoterol fumarate) 4)  Furosemide 40 Mg Tabs (Furosemide) .[name].. 1 by mouth bid 5)  Allergy Shots  6)  Requip 2 Mg Tabs (Ropinirole hcl) .... 4 daily (1-am, 1,lunch,2-evening) 7)  Nexium 40 Mg Cpdr (Esomeprazole magnesium) .[name].. 1 two times a day 8)  Aspirin Ec 325 Mg Tbec (Aspirin) .... Take one tablet by mouth daily 9)  Magnesium 250 Mg Tabs (Magnesium) .[name].. 1 by mouth once daily 10)  Calcium Plus Vit D  .... 1 by mouth once daily  Patient Instructions: 1)  Hold Statin; consume < 30 grams of High Fructose Corn Syrup sugar/ day.Avoid stimulants to excess due to tremor. 2)  Please schedule a follow-up  fasting Lab appointment in 3 months. 3)  Hepatic Panel prior to visit, ICD-9:790.4 4)  Lipid Panel prior to visit, ICD-9:272.4.

## 2010-06-12 NOTE — Progress Notes (Signed)
Summary: cholesterol med  Phone Note Call from Patient Call back at Home Phone [phone]   Summary of Call: Patient received lab results in the mail and notes that she was taken off all of her cholesterol meds. She want to know if you would like to put her on something for cholesterol again due to the high numbers. Please advise. Initial call taken by: Lucious Groves CMA,  March 10, 2010 3:52 PM  Follow-up for Phone Call        Pravastatin 40 mg at bedtime #90. Consume < 30 grams of High Fructose Corn Syrup sugar / day. Recheck fasting labs in 10 weeks & see me 2-3 days later(lipids, hepatic panel; 272.4, 995.20) Follow-up by: Marga Melnick MD,  March 11, 2010 6:35 AM  Additional Follow-up for Phone Call Additional follow up Details #1::        Patient notified and will call back for appt. Additional Follow-up by: Lucious Groves CMA,  March 11, 2010 8:40 AM    New/Updated Medications: PRAVACHOL 40 MG TABS (PRAVASTATIN SODIUM) 1 by mouth qhs Prescriptions: PRAVACHOL 40 MG TABS (PRAVASTATIN SODIUM) 1 by mouth qhs  #90 x 0   Entered by:   Lucious Groves CMA   Authorized by:   Marga Melnick MD   Signed by:   Lucious Groves CMA on 03/11/2010   Method used:   Electronically to        [facility] (retail)       [address]       [place], [place]  [address]       Ph: [phone]       Fax: [phone]   RxID:   [id number]

## 2010-06-12 NOTE — Progress Notes (Signed)
Summary: Lab results  Phone Note Outgoing Call   Summary of Call: Spoke with patient about labs below:  Glucose significantly elevated ; Diabetes screening test will be added.Low White Count & increased Lymphocyte count suggest recent viral infection . Mild anemia present.Two liver enzymes elevated. Please complete stool cards & have repeat fasting labs week of 06/27 & see me the week of 07/04.Stop cholesterol med & avoid excess Tylenol, vitamin A & alcohol until seen. Hopp (Fasting hepatic panel, B12/folate , CBC & dif , iron panel, acute Hepatitis Panel; 790.4,780.79, 285.9)  **Patient will be out of town the week of the 4th, scheduled f/u the week of the 11th. Patient will have additional labs drawn tomorrow, copy of labs placed at the front for pick-up** Erin Roach  November 05, 2009 8:30 AM

## 2010-06-12 NOTE — Progress Notes (Signed)
Summary: Triage: Cold SX  Phone Note Call from Patient Call back at Home Phone [phone]   Caller: Patient Summary of Call: Patient with slight fever, chest tightness due to cough and eye crusty. Patient would like to know if a rx can be called in for her since her labs showed a recent viral infection. Patient informed she needs appointment and stated if I could run this by the Doctor first cause she was just here  Please advise    [facility]  November 06, 2009 8:45 AM   Follow-up for Phone Call        would need appt to be seen b/c i wouldn't know what abx to call in w/out seeing her to know what needs to be treated.  she did not have these sxs 1 week ago and if she truly had/has a viral illness an abx will not be effective anyway Follow-up by: Neena Rhymes MD,  November 06, 2009 8:57 AM  Additional Follow-up for Phone Call Additional follow up Details #1::        Called patient on home number-line was busy, called patient on work number-patient no longer working at that number, will try again later Additional Follow-up by: Shonna Chock,  November 06, 2009 11:35 AM    Additional Follow-up for Phone Call Additional follow up Details #2::    I spoke with patient and she ok'd information and said she already has an appointment with someone else Follow-up by: Shonna Chock,  November 06, 2009 1:26 PM

## 2010-06-12 NOTE — Assessment & Plan Note (Signed)
Summary: FLU SHOT/CBS   Nurse Visit  CC: Flu shot./kb   Allergies: No Known Drug Allergies  Orders Added: 1)  Flu Vaccine 49yrs + MEDICARE PATIENTS [Q2039] 2)  Administration Flu vaccine - MCR [G0008]         Flu Vaccine Consent Questions     Do you have a history of severe allergic reactions to this vaccine? no    Any prior history of allergic reactions to egg and/or gelatin? no    Do you have a sensitivity to the preservative Thimersol? no    Do you have a past history of Guillan-Barre Syndrome? no    Do you currently have an acute febrile illness? no    Have you ever had a severe reaction to latex? no    Vaccine information given and explained to patient? yes    Are you currently pregnant? no    Lot Number:[id number]   Exp Date:11/08/2010   Site Given  Left Deltoid IMlu

## 2010-06-26 NOTE — Progress Notes (Signed)
Summary: Triage: Returning a Call  Phone Note Call from Patient   Caller: Patient Summary of Call: Message left on Triage voicemail: Patient returning a call to someone   Shonna Chock CMA  June 12, 2010 5:18 PM   Follow-up for Phone Call        It appears that this patient needs labs and office visit with MD to follow.    Copied from phone note in October/2011 Recheck fasting labs in 10 weeks & see me 2-3 days later(lipids, hepatic panel; 272.4, 995.20) Follow-up by: Lucious Groves CMA,  June 13, 2010 9:21 AM  Additional Follow-up for Phone Call Additional follow up Details #1::        Patient notified, and scheduled appt. Refills sent b/c the patient will run out prior to appt. Additional Follow-up by: Lucious Groves CMA,  June 16, 2010 4:28 PM    Prescriptions: PRAVACHOL 40 MG TABS (PRAVASTATIN SODIUM) 1 by mouth at bedtime**LABS DUE**  #30 x 2   Entered by:   Lucious Groves CMA   Authorized by:   Nolon Rod. Paz MD   Signed by:   Lucious Groves CMA on 06/16/2010   Method used:   Electronically to        [facility] (retail)       [address]       [place], [place]  [address]       Ph: [phone]       Fax: [phone]   RxID:   [id number]

## 2010-06-26 NOTE — Letter (Signed)
Summary: [facility]  [facility]   Imported By: Sherian Rein 06/17/2010 10:15:57  _____________________________________________________________________  External Attachment:    Type:   Image     Comment:   External Document

## 2010-07-14 ENCOUNTER — Other Ambulatory Visit: Payer: Self-pay | Admitting: Internal Medicine

## 2010-07-14 ENCOUNTER — Encounter (INDEPENDENT_AMBULATORY_CARE_PROVIDER_SITE_OTHER): Payer: Self-pay | Admitting: *Deleted

## 2010-07-14 ENCOUNTER — Other Ambulatory Visit (INDEPENDENT_AMBULATORY_CARE_PROVIDER_SITE_OTHER): Payer: Medicare Other

## 2010-07-14 DIAGNOSIS — T887XXA Unspecified adverse effect of drug or medicament, initial encounter: Secondary | ICD-10-CM

## 2010-07-14 DIAGNOSIS — E785 Hyperlipidemia, unspecified: Secondary | ICD-10-CM

## 2010-07-14 LAB — LIPID PANEL: HDL: 52.7 mg/dL (ref >39.00)

## 2010-07-14 LAB — HEPATIC FUNCTION PANEL
ALT: 30 U/L (ref 0–35)
AST: 23 U/L (ref 0–37)
Alkaline Phosphatase: 63 U/L (ref 39–117)
Bilirubin, Direct: 0.1 mg/dL (ref 0.0–0.3)
Total Protein: 6.3 g/dL (ref 6.0–8.3)

## 2010-07-17 ENCOUNTER — Ambulatory Visit (INDEPENDENT_AMBULATORY_CARE_PROVIDER_SITE_OTHER): Payer: Medicare Other | Admitting: Internal Medicine

## 2010-07-17 ENCOUNTER — Encounter: Payer: Self-pay | Admitting: Internal Medicine

## 2010-07-17 ENCOUNTER — Ambulatory Visit: Payer: Self-pay | Admitting: Internal Medicine

## 2010-07-17 DIAGNOSIS — M79609 Pain in unspecified limb: Secondary | ICD-10-CM

## 2010-07-17 DIAGNOSIS — G2581 Restless legs syndrome: Secondary | ICD-10-CM

## 2010-07-17 DIAGNOSIS — E785 Hyperlipidemia, unspecified: Secondary | ICD-10-CM

## 2010-07-17 DIAGNOSIS — R609 Edema, unspecified: Secondary | ICD-10-CM

## 2010-07-17 LAB — CONVERTED CEMR LAB: Cholesterol, target level: 200 mg/dL

## 2010-07-22 NOTE — Assessment & Plan Note (Signed)
Summary: Erin Roach f/u and discuss labs/kb   Vital Signs:  Patient profile:   70 year old female Weight:      186.4 pounds BMI:     29.74 Pulse rate:   80 / minute Resp:     14 per minute BP sitting:   118 / 82  (left arm) Cuff size:   large  Vitals Entered By: Shonna Chock CMA (July 17, 2010 9:25 AM) CC: Follow-up visit: discuss labs (copy given) and discuss ASA dose, Lipid Management, Lower Extremity Joint pain   Primary Care Provider:  Marga Melnick MD  CC:  Follow-up visit: discuss labs (copy given) and discuss ASA dose, Lipid Management, and Lower Extremity Joint pain.  History of Present Illness:       Labs reviewed & risks discussed; LDL long term risk has dropped 90%. She  denies muscle aches, GI upset, abdominal pain, flushing, itching, constipation, diarrhea, and fatigue.  Other symptoms include pedal edema ( improved on Spironolactone).  The patient denies the following symptoms: chest pain/pressure, exercise intolerance, dypsnea, and palpitations.  Compliance with medications (by patient report) has been near 100%.  Dietary compliance has been fair.  The patient reports exercising 3 X per week.  Adjunctive measures currently used by the patient include fiber, ASA, and fish oil supplements.        The patient also presents with  bilateral foot pain for > 12 months, worse @ night.  The patient denies redness, giving away, locking, and decreased ROM.  The pain is located in the sole  , especially  medial arch  of R &  left foot. The heels are spared. The pain began gradually.  The pain is described as burning.  Associated symptoms include intermittent , mild  diarrhea.  The patient denies the following symptoms: fever, rash, photosensitivity, eye symptoms, and dysuria.  PMH of RLS  Lipid Management History:      Positive NCEP/ATP III risk factors include female age 44 years old or older.  Negative NCEP/ATP III risk factors include non-diabetic, no family history for ischemic heart  disease, non-tobacco-user status, non-hypertensive, no ASHD (atherosclerotic heart disease), no prior stroke/TIA, no peripheral vascular disease, and no history of aortic aneurysm.     Current Medications (verified): 1)  Vesicare 10 Mg  Tabs (Solifenacin Succinate) .[name].. 1 Once Daily 2)  Symbicort 160-4.5 Mcg/act  Aero (Budesonide-Formoterol Fumarate) 3)  Allergy Shots 4)  Requip 2 Mg  Tabs (Ropinirole Hcl) .... 4 Daily (1-Am, 1,lunch,2-Evening) 5)  Nexium 40 Mg  Cpdr (Esomeprazole Magnesium) .[name].. 1 Two Times A Day 6)  Aspirin 81 Mg Tabs (Aspirin) .[name].. 1 By Mouth Once Daily 7)  Magnesium 250 Mg Tabs (Magnesium) .[name].. 1 By Mouth Once Daily 8)  Calcium Plus Vit D .... 1 By Mouth Once Daily 9)  Spironolactone 25 Mg Tabs (Spironolactone) .[name].. 1 Two Times A Day 10)  Pravachol 40 Mg Tabs (Pravastatin Sodium) .[name].. 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 LDL goal = < 130. [place] Study LDL goal = < 160. 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 GU:  Denies discharge, dysuria, and hematuria.  Physical Exam  General:  well-nourished,in no acute distress; alert,appropriate and cooperative throughout examination Lungs:  Normal respiratory effort, chest expands symmetrically. Lungs are clear to auscultation, no crackles or wheezes. Heart:  Normal rate and regular rhythm. S1 and S2 normal without gallop,  click, rub .G 1/2 murmur Pulses:  R and L  carotid,radial,dorsalis pedis and posterior tibial pulses are full and equal bilaterally Extremities:  trace left pedal edema and trace right pedal edema.  Low  arches. Mild venous insufficiency Neurologic:  alert & oriented X3, strength normal in all extremities, and DTRs symmetrical and normal.   Skin:  Intact without suspicious lesions or rashes; skin changes associated with V V over legs, feet Psych:  memory intact for recent and remote, normally interactive, and good eye contact.     Impression & Recommendations:  Problem # 1:  HYPERLIPIDEMIA (ICD-272.4)  The following medications were removed from the medication list:    Pravachol 40 Mg Tabs (Pravastatin sodium) .[name]... 1 by mouth at bedtime Her updated medication list for this problem includes:    Lipitor 20 Mg Tabs (Atorvastatin calcium) .[name]... 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 1 once daily 2)  Symbicort 160-4.5 Mcg/act Aero (Budesonide-formoterol fumarate) 3)  Allergy Shots  4)  Requip 2 Mg Tabs (Ropinirole hcl) .... 4 daily (1-am, 1,lunch,2-evening) 5)  Nexium 40 Mg Cpdr (Esomeprazole magnesium) .[name].. 1 two times a day 6)  Aspirin 81 Mg Tabs (Aspirin) .[name].. 1 by mouth once daily 7)  Magnesium 250 Mg Tabs (Magnesium) .[name].. 1 by mouth once daily 8)  Calcium Plus Vit D  .... 1 by mouth once daily 9)  Spironolactone 25 Mg Tabs (Spironolactone) .[name].. 1 two times a day 10)  Lipitor 20 Mg Tabs (Atorvastatin calcium) .[name].. 1 pill m, w , f & sun  Lipid Assessment/Plan:      Based on NCEP/ATP III, the patient's risk factor category is "2 or more risk factors and a calculated 10 year CAD risk of < 20%".  The patient's lipid goals are as follows: Total cholesterol goal is 200;  LDL cholesterol goal is 160; HDL cholesterol goal is 40; Triglyceride goal is 150.    Patient Instructions: 1)  Please schedule a follow-up appointment in 4 months. 2)  Hepatic Panel prior to visit, ICD-9:995.20 3)   [facility] Lipid Panel (1304X) prior to visit, ICD-9:272.4 Prescriptions: LIPITOR 20 MG TABS (ATORVASTATIN CALCIUM) 1 pill M, W , F & Sun  #30 x 2   Entered and Authorized by:   Marga Melnick MD   Signed by:   Marga Melnick MD on 07/17/2010   Method used:   Print then Give to Patient   RxID:   [id number]    Orders Added: 1)  Erin. Patient Level IV [14782] 2)  Sports Medicine [Sports Med]

## 2010-08-07 ENCOUNTER — Encounter: Payer: Self-pay | Admitting: Sports Medicine

## 2010-08-07 ENCOUNTER — Ambulatory Visit (INDEPENDENT_AMBULATORY_CARE_PROVIDER_SITE_OTHER): Payer: Medicare Other | Admitting: Sports Medicine

## 2010-08-07 VITALS — BP 120/75 | HR 92 | Ht 66.0 in | Wt 180.0 lb

## 2010-08-07 DIAGNOSIS — M79609 Pain in unspecified limb: Secondary | ICD-10-CM

## 2010-08-07 NOTE — Progress Notes (Signed)
  Subjective:    Patient ID: Erin Roach, female    DOB: 03-18-1941, 70 y.o.   MRN: [id number]  HPI 70 year old female referred to [name] by Dr. Alwyn Ren for evaluation of bilateral foot pain. Pain has been ongoing for several years, denies any injury or trauma. Pain is described as a burning sensation all along the bottom of her feet, not necessarily worse at the heels. She's noted some increased swelling of her toes. She has tried taking Aleve with only slight improvement. She has not worn any foot inserts in her shoes. She does have history of plantar fasciitis, but current pain feels different than that. As she has been evaluated for diabetes in the past and labs have been normal. Patient also complaining of right great toe pain, thinks she has arthritis in this joint. Of note has fractured her right fifth toe twice. Pain is typically worse at the end of the day. Does not wake her up at night, although she does suffer from restless leg syndrome.   Review of Systems Per history of present illness, otherwise negative BP 120/75  Pulse 92  Ht 5\' 6"  (1.676 m)  Wt 180 lb (81.647 kg)  BMI 29.05 kg/m2     Objective:   Physical Exam GENERAL: Alert and oriented x3, no acute distress, pleasant SKIN: No rashes or lesions EXTREMITIES: Multiple varicosities of lower extremities bilaterally, trace edema noted MSK:   - Feet: Collapse of the longitudinal arches bilaterally, left greater than right. Stands with increased pronation and external rotation of both feet, left greater than right. Collapse of the transverse arch and splaying of the toes bilaterally, with some associated toe swelling. Starting to develop hammering of toes on the right foot, most prominent over right second toe. Decreased great toe motion on the right consistent with hallux rigidus, mildly tender palpation at the MTP joint. No associated erythema or swelling. She has mild tenderness palpation along the length of the plantar fascia, mild  tenderness at insertion of plantar fascia bilaterally. - Gait: Walks with feet externally rotated with increased pronation bilaterally left greater than right. No limp. No leg length difference.  MSK ultrasound: Right foot with plantar fascia measuring 0.48 cm with hypoechoic area within the plantar fascia consistent with some tearing, no increased Doppler flow is noted. Noted to have hyperechoic line both on dorsal and plantar surfaces of the plantar fascia consistent with fibrosis, this extends entire length of the plantar fascia on the right. Left plantar fascia measuring 0.46 cm, no visible gaps appreciated. Has a slight fibrosis along the long arch, not as prominent as on the right.  Right great toe shows hypoechoic fluid at the MTP joint with spurring findings consistent with osteoarthritis. Images saved.    Assessment & Plan:

## 2010-08-07 NOTE — Assessment & Plan Note (Addendum)
-   Bilateral foot pain with significant transverse and longitudinal arch collapse. Suspect pain mechanical in nature, although plantar fascial fibrosis noted on ultrasound may indicate possible underlying etiologies such as diabetes or even thyroid disorder. - Fitted with Sports Insoles in office today with small metatarsal and scaphoid pads, small medial heel lift was placed on the left insole. Additional scaphoid pads and metatarsal pads placed on insoles of her current shoes. These were comfortable in the office and gait was more neutral. - Discussed restless leg syndrome, may consider trial of clonazepam if Requip is not helping. She may note some improvement in symptoms with orthotics as may lessen a leg fatigue. - Followup with [name] in 4-6 weeks for reevaluation, may be candidate for custom orthotics in the future.

## 2010-08-15 LAB — CBC
HCT: 36.8 % (ref 36.0–46.0)
HCT: 37.2 % (ref 36.0–46.0)
Hemoglobin: 12.8 g/dL (ref 12.0–15.0)
Hemoglobin: 13 g/dL (ref 12.0–15.0)
MCHC: 34.3 g/dL (ref 30.0–36.0)
MCV: 88.2 fL (ref 78.0–100.0)
MCV: 89 fL (ref 78.0–100.0)
RBC: 4.14 MIL/uL (ref 3.87–5.11)
RBC: 4.22 MIL/uL (ref 3.87–5.11)
RBC: 4.32 MIL/uL (ref 3.87–5.11)
RDW: 13.6 % (ref 11.5–15.5)
WBC: 7.1 10*3/uL (ref 4.0–10.5)

## 2010-08-15 LAB — D-DIMER, QUANTITATIVE: D-Dimer, Quant: 0.72 ug/mL-FEU — ABNORMAL HIGH (ref 0.00–0.48)

## 2010-08-15 LAB — COMPREHENSIVE METABOLIC PANEL
ALT: 34 U/L (ref 0–35)
BUN: 10 mg/dL (ref 6–23)
CO2: 25 mEq/L (ref 19–32)
Calcium: 8.4 mg/dL (ref 8.4–10.5)
Creatinine, Ser: 0.62 mg/dL (ref 0.4–1.2)
GFR calc non Af Amer: >60 mL/min (ref >60)
Glucose, Bld: 121 mg/dL — ABNORMAL HIGH (ref 70–99)
Sodium: 139 mEq/L (ref 135–145)
Total Protein: 5.9 g/dL — ABNORMAL LOW (ref 6.0–8.3)

## 2010-08-15 LAB — DIFFERENTIAL
Basophils Absolute: 0 10*3/uL (ref 0.0–0.1)
Basophils Relative: 0 % (ref 0–1)
Eosinophils Absolute: 0.5 10*3/uL (ref 0.0–0.7)
Eosinophils Relative: 6 % — ABNORMAL HIGH (ref 0–5)
Lymphs Abs: 1.9 10*3/uL (ref 0.7–4.0)

## 2010-08-15 LAB — CK TOTAL AND CKMB (NOT AT ARMC)
CK, MB: 1.3 ng/mL (ref 0.3–4.0)
Total CK: 25 U/L (ref 7–177)

## 2010-08-15 LAB — CARDIAC PANEL(CRET KIN+CKTOT+MB+TROPI)
CK, MB: 0.9 ng/mL (ref 0.3–4.0)
Total CK: 23 U/L (ref 7–177)
Troponin I: 0.01 ng/mL (ref 0.00–0.06)

## 2010-08-15 LAB — LIPID PANEL
Cholesterol: 213 mg/dL — ABNORMAL HIGH (ref 0–200)
HDL: 60 mg/dL (ref >39)
Total CHOL/HDL Ratio: 3.6 RATIO

## 2010-08-15 LAB — POCT CARDIAC MARKERS: CKMB, poc: <1 ng/mL — ABNORMAL LOW (ref 1.0–8.0)

## 2010-08-15 LAB — HEMOGLOBIN A1C: Hgb A1c MFr Bld: 6 % (ref 4.6–6.1)

## 2010-08-15 LAB — PROTIME-INR: Prothrombin Time: 12.9 seconds (ref 11.6–15.2)

## 2010-08-16 LAB — BASIC METABOLIC PANEL
CO2: 26 mEq/L (ref 19–32)
Chloride: 103 mEq/L (ref 96–112)
GFR calc Af Amer: >60 mL/min (ref >60)
Sodium: 138 mEq/L (ref 135–145)

## 2010-08-22 ENCOUNTER — Encounter: Payer: Self-pay | Admitting: Internal Medicine

## 2010-08-22 ENCOUNTER — Ambulatory Visit (INDEPENDENT_AMBULATORY_CARE_PROVIDER_SITE_OTHER): Payer: Medicare Other | Admitting: Internal Medicine

## 2010-08-22 DIAGNOSIS — E162 Hypoglycemia, unspecified: Secondary | ICD-10-CM

## 2010-08-22 DIAGNOSIS — I872 Venous insufficiency (chronic) (peripheral): Secondary | ICD-10-CM

## 2010-08-22 DIAGNOSIS — R609 Edema, unspecified: Secondary | ICD-10-CM

## 2010-08-22 DIAGNOSIS — E161 Other hypoglycemia: Secondary | ICD-10-CM

## 2010-08-22 DIAGNOSIS — G2581 Restless legs syndrome: Secondary | ICD-10-CM

## 2010-08-22 DIAGNOSIS — J45991 Cough variant asthma: Secondary | ICD-10-CM

## 2010-08-22 LAB — HEMOGLOBIN A1C: Hgb A1c MFr Bld: 5.5 % (ref 4.6–6.5)

## 2010-08-22 MED ORDER — SOLIFENACIN SUCCINATE 10 MG PO TABS: 10.0000 mg | ORAL_TABLET | Freq: Every day | ORAL | Status: DC

## 2010-08-22 NOTE — Progress Notes (Signed)
Addended by: Stephan Minister on: 08/22/2010 12:03 PM   Modules accepted: Orders

## 2010-08-22 NOTE — Progress Notes (Signed)
Subjective:    Patient ID: Erin Roach, female    DOB: 12-11-1940, 70 y.o.   MRN: [id number]  HPI See multiple concerns. It was felt that the Nexium, PPI, was not controlling reflux and this was causing cough.  She saw the allergist who recommended changing from Nexium to Dexilant to optimize GERD control . Expense precluded her filling this.the cough has improved; she questions whether it was simply related to seasonal allergies.  She is concerned she has diabetes based on multiple symptoms. Specifically she has polyuria, polyphasia, polydipsia. She has nocturnal weakness which has responded to glucose tablets she bought over-the-counter. She's had borderline hyperglycemia in the past. Dr.Fields question diabetic changes in her feet. There is a strong family history diabetes in both her maternal grandmother and great-grandmother.  She feels she's getting a suboptimal response of her restless leg syndrome to  Requip. Dr. Darrick Penna had assessed this. He questions that inserts might make a difference in her symptoms. She does not believe that has occurred to date .     Review of Systems she denies symptoms of claudication. She's had no skin lesions or ulcers which are nonhealing.  The numbness and burning in her lower  extremities has improved with interventions by  Dr. Darrick Penna.  She also has been having some insomnia; she is requesting a sleeping pill. No apnea reported ; no stimulants taken @ night. She reads & watches tv occasionally in bed.     Objective:   Physical Exam on exam sheexhibits no distress. Skin is warm and dry. She does have varicosities of the lower extremities. No lesions or ulcers were otherwise present.  Fundal exam reveals no significant vascular lesions  Chest is clear to auscultation without rales, rhonchi, or wheezing.  She has a very faint grade 1/2 systolic murmur of the left base with suggestion of left carotid radiation.  She has no organomegaly or masses of  the abdomen. ,  Pulses are intact. Trace edema  Is noted , L >R foot.        Assessment & Plan:

## 2010-08-22 NOTE — Patient Instructions (Addendum)
The most common cause of elevated triglycerides &  pre Diabetes is the ingestion of sugar from high fructose corn syrup sources. You should consume less than 40 grams  of sugar per day from foods and drinks with high fructose corn syrup as number 2, 3, or #4 on the label.  Please check with your insurance company as to alternatives for the Requip. Informed him that the medicine is causing nausea.  The book  The New Sugar Busters provides the best information on hypoglycemic carbs which would induce reactive hypoglycemia.  Take zolpidem 5 mg every third night as needed. If this not does not provide adequate rest then I would recommend referral to a sleep center. Sleep hygiene as discussed.

## 2010-09-08 ENCOUNTER — Encounter: Payer: Self-pay | Admitting: Sports Medicine

## 2010-09-08 ENCOUNTER — Ambulatory Visit (INDEPENDENT_AMBULATORY_CARE_PROVIDER_SITE_OTHER): Payer: Medicare Other | Admitting: Sports Medicine

## 2010-09-08 VITALS — BP 136/77

## 2010-09-08 DIAGNOSIS — M79609 Pain in unspecified limb: Secondary | ICD-10-CM

## 2010-09-08 DIAGNOSIS — G2581 Restless legs syndrome: Secondary | ICD-10-CM

## 2010-09-08 MED ORDER — CLONAZEPAM 0.5 MG PO TABS: 0.5000 mg | ORAL_TABLET | Freq: Two times a day (BID) | ORAL | Status: DC

## 2010-09-08 NOTE — Progress Notes (Signed)
  Subjective:    Patient ID: Erin Roach, female    DOB: 04-Jun-1940, 70 y.o.   MRN: [id number]  HPI  Pt presents to clinic for f/u of bilat PF which she reports is about 70% improved.  Using green sports insoles with metatarsal pads, scaphoid pad and heel wedge on lt- states these are very helpful.  Walking 20 min 3 times per week.  Having significant pain 3rd great toe- toenail starting to ingrow.  Has had ingrown toenails removed in the past.   Has pain at 2nd MTP on lt when barefoot.  No more night time pain.  She is doing the standard exercises and stretches. She also feels best when walking shoes that have a sports insoles in place.  She still having a fair amount of symptoms with restless leg. Requip helped somewhat but has not illuminated most of her symptoms. She would like to try another type of medication.  Review of Systems     Objective:   Physical Exam     Corn at lt 2nd MTP Redness and tenderness of rt 3rd toe distally Decreased motion of rt great toe  Hammering of the toes noted 34 and 5 on the right  Flattening of the transverse arch bilaterally  No tenderness on palpation of the plantar fascia today.   Assessment & Plan:

## 2010-09-08 NOTE — Assessment & Plan Note (Signed)
Since she has had an incomplete response to Requip I suggested that we put her on a trial of Klonopin. This may be useful as well if she is having spasm in the plantar fascia at night although that seems to have lessened considerably.  She were removed and the Requip over 2 weeks' time while adding in Klonopin. If this works well she will try this for the next 6 weeks and then check with me at that time.  I will send a note to Dr. Derek Jack so that he knows that we are making this change.

## 2010-09-08 NOTE — Patient Instructions (Addendum)
Use eucerin cream on toenails to soften 1% hydrocortisone ointment to help with irritation Slide wax dental floss under painful toenail to relieve pressure Do easy movements of your feet and toes when they are in warm water

## 2010-09-08 NOTE — Assessment & Plan Note (Signed)
This is much improved. I don't think she had typical plantar fasciitis but rather had more generalized foot breakdown. With this in mind I would like to keep her in sports insoles that include metatarsal pads. We also added scaphoid pads and on the left insert added a heel wedge to block her calcaneal valgus. We measure a second pair of these today. If these are wearing out she can come by for replacement without a scheduled appointment. The supply cost of this should be low. However if her pain recurs would still consider her a candidate for custom orthotics.

## 2010-09-09 ENCOUNTER — Ambulatory Visit: Payer: Medicare Other | Admitting: Sports Medicine

## 2010-09-23 NOTE — Procedures (Signed)
LOWER EXTREMITY VENOUS REFLUX EXAM   INDICATION:  Bilateral leg heaviness, swelling and pain.   EXAM:  Using color-flow imaging and pulse Doppler spectral analysis, the  right and left common femoral, superficial femoral, popliteal, posterior  tibial, greater and lesser saphenous veins are evaluated.  There is no  evidence suggesting deep venous insufficiency in the right and left  lower extremity.   The right saphenofemoral junction appears to be incompetent.  The right  and left GSV appears to be ablated.   The left proximal short saphenous vein demonstrates incompetency.   GSV Diameter (used if found to be incompetent only)                                            Right    Left  Proximal Greater Saphenous Vein           cm       cm  Proximal-to-mid-thigh                     cm       cm  Mid thigh                                 cm       cm  Mid-distal thigh                          cm       cm  Distal thigh                              cm       cm  Knee                                      cm       cm   IMPRESSION:  1. The right and left greater saphenous vein appears to be ablated      with minimal flow at the right saphenofemoral junction to proximal      greater saphenous vein.  2. The right and left greater saphenous vein is not aneurysmal.  3. The right and left greater saphenous vein is not tortuous.  4. The deep venous system is competent.  5. The left lesser saphenous vein is not competent.  6. No evidence of DVT noted in bilateral legs.       ___________________________________________  Quita Skye Hart [place], M.D.   MG/MEDQ  D:  11/27/2008  T:  11/28/2008  Job:  [id number]

## 2010-09-23 NOTE — Assessment & Plan Note (Signed)
[facility]                                 ON-CALL NOTE   Erin Roach, Erin Roach                        MRN:          [id number]  DATE:01/01/2007                            DOB:          1941-04-03    PRIMARY CARE PHYSICIAN:  Titus Dubin. Alwyn Ren, MD   PHONE NUMBER:  [phone]   TIME OF CALL:  11:20 a.m. on August 23   She feels she has a urinary tract infection so she was given an  appointment at the clinic this morning.     Karie Schwalbe, MD  Electronically Signed    RIL/MedQ  DD: 01/01/2007  DT: 01/02/2007  Job #: [id number]   cc:   Titus Dubin. Alwyn Ren, MD,FACP,FCCP

## 2010-09-23 NOTE — Consult Note (Signed)
NEW PATIENT CONSULTATION   KHRISTI, SCHILLER  DOB:  09-05-1940                                       11/27/2008  ZOXWR#:[id number]   The patient is a healthy 70 year old female referred by Dr. Alwyn Ren for  bilateral lower extremity edema and discomfort in the left leg.  She  states she has a diagnosis of restless leg syndrome which causes her  discomfort at night when her legs burn and sting, particularly in the  lateral thigh and calf area.  She also has edema in both lower  extremities during the day which progress the more she stands.  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  She has no history of deep venous thrombosis, thrombophlebitis, stasis  ulcers or bleeding.  She did have some type of treatment at [facility] many years ago but she does not think it was a laser  closure she is uncertain exactly what treatment was performed.  She  elevates her legs.  The swelling is improved but it does not help the  pain which generally occurs at night.  She does occasionally wear calf  compression stockings.   PAST MEDICAL HISTORY:  1. Hyperlipidemia.  2. Restless leg syndrome.  3. Gastroesophageal reflux disease.  4. Asthma.  5. History of squamous cell skin cancer.  6. Bladder incontinence.   PAST SURGICAL HISTORY:  1. Appendectomy.  2. Tonsillectomy.  3. 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   FAMILY HISTORY:  Is negative for coronary artery disease, diabetes or  stroke.  Father had esophageal cancer.   SOCIAL HISTORY:  The patient is married and has two children and is a  retired Runner, broadcasting/film/video.  Does not smoke since 1973.  Drinks occasional alcohol.   REVIEW OF SYSTEMS:  Essentially unremarkable.   ALLERGIES:  None.   MEDICATIONS:  Please see health history form.   PHYSICAL EXAMINATION:  Blood pressure is 140/70, heart rate 70,  respirations 14.   Generally she is a healthy-appearing middle-aged  female in no apparent distress, alert and oriented x3.  Neck is supple.  3+ carotid pulses palpable.  No bruits are audible.  Neurologic exam is  normal.  No palpable adenopathy in the neck.  Chest clear to  auscultation.  Cardiovascular exam is regular rhythm.  No murmurs.  Abdomen is soft, nontender with no masses.  She has 3+ femoral,  popliteal and dorsalis pedis pulses bilaterally.  There is no  hyperpigmentation or ulceration or severe venous insufficiency noted.  She does have 1+ edema bilaterally.  No bulging varicosities are noted.  She has some spider veins in the thighs medially and laterally in the  lateral calf areas.   Venous duplex exam revealed deep system to be widely patent bilaterally  with no evidence of deep venous obstruction.  Right great saphenous vein  appears to have been ablated up to the proximal third where it is patent  and has reflux up to the junction.  Left leg has some reflux in the  small saphenous vein.  The left great saphenous vein appears to be  ablated.   She does not appear to have arterial or venous anatomy to account for  her symptoms.  If  she should develop increasing pain and swelling in the  right leg we could consider ablation of the remaining right great  saphenous vein at the saphenofemoral junction but with her symptoms  being bilateral it does not appear to me that any procedure is indicated  at this time unless her symptoms progress.   Quita Skye Hart [place], M.D.  Electronically Signed   JDL/MEDQ  D:  11/27/2008  T:  11/28/2008  Job:  [id number]   cc:   Titus Dubin. Alwyn Ren, MD,FACP,FCCP

## 2010-09-23 NOTE — Op Note (Signed)
NAME:  Erin Roach, Erin Roach               ACCOUNT NO.:  [id number]   MEDICAL RECORD NO.:  [id number]          PATIENT TYPE:  AMB   LOCATION:  DSC                          FACILITY:  [facility]   PHYSICIAN:  Katy Fitch. Sypher, M.D. DATE OF BIRTH:  1940-11-06   DATE OF PROCEDURE:  12/20/2008  DATE OF DISCHARGE:                               OPERATIVE REPORT   PREOPERATIVE DIAGNOSES:  McGowan grade 3 ulnar neuropathy, left upper  extremity with positive Froment sign, intrinsic atrophy, weakness of  pinch and grasp as well as a positive Wartenberg sign.   POSTOPERATIVE DIAGNOSES:  McGowan grade 3 ulnar neuropathy, left upper  extremity with positive Froment sign, intrinsic atrophy, weakness of  pinch and grasp as well as a positive Wartenberg sign.   OPERATION:  In situ decompression, left ulnar nerve at cubital tunnel  with partial resection of medial triceps fascia.   OPERATING SURGEON:  Katy Fitch. Sypher, MD   ASSISTANT:  Marveen Reeks Dasnoit, PA   ANESTHESIA:  General by LMA.   SUPERVISING ANESTHESIOLOGIST:  Janetta Hora. Gelene Mink, MD   INDICATIONS:  Erin Roach is a 70 year old woman self-referred for  evaluation and management of a numb and weak left hand.  Her primary  care physician is Dr. Alwyn Ren.   She had developed numbness in her ring and small fingers of the left  hand followed by muscle atrophy of the intrinsic muscles, weakness of  pinch and grasp.  She was seen for evaluation in the office where she  was noted to have signs of McGowan 3 ulnar neuropathy at the cubital  tunnel.   Dr. Johna Roles completed electrodiagnostic studies confirming significant  swelling of the ulnar nerve across the left elbow.   We advised either in situ decompression or if an unstable nerve was  identified, anterior transposition of the nerve.   She is brought to the operating room at this time anticipating  exploration of the ulnar nerve and appropriate intervention.   Preoperatively, she was  cautioned to not expect immediate recovery.  She  will need to wait anywhere between 12 and 15 months to see the full  benefit of decompression and/or transposition.   Preoperative questions were invited and answered in detail.   PROCEDURE:  [name] is brought to the operating room and placed  in supine position upon the operating table.   Following the induction of general anesthesia by LMA technique under Dr.  Thornton Dales strict supervision, the left arm was prepped with Betadine  soap solution, sterilely draped.  A pneumatic tourniquet was applied on  the proximal left brachium.   Following exsanguination of the left arm with Esmarch bandage, arterial  tourniquet was inflated to 250 mmHg due to mild systolic hypertension at  anesthesia induction.   The procedure commenced with resection of an area of skin abrasion  directly over the anticipated wound.  Subcutaneous tissues were  carefully divided taking care to gently dissect, identify, and protect  the medial antebrachial cutaneous nerve branches.   The ulnar nerve was identified by palpation posterior to the epicondyle.  The fascia at the head  of the flexor carpi ulnaris was excised including  arcuate ligament and multiple fascial bands overlying the nerve.  The  flexor carpi ulnaris muscle was split over distance of 4 cm with its  fascia being released, muscle fibers teased apart, and release of all  offending bands of fascia.  Proximally, there was significant entrapment  the nerve at the level of the arcade of Struthers.  This was  electrocauterized and released with scissors.  The fascia overlying the  nerve was gently dissected off the nerve.   The medial head of the triceps was covering the nerve and caused some  compression with elbow flexion beyond 90 degrees.  This was resected  over distance of approximately 2 cm with a width of fascial resection  between 5 and 4 mm.   At the conclusion of the  decompression, the nerve was stable through a  range of 0-130 degrees elbow flexion.  There were no masses or  predicaments noted.   Bleeding points were electrocauterized with bipolar forceps.  The  tourniquet was released and the wound irrigated with saline.   The wound was repaired with subcutaneous suture of 4-0 Vicryl followed  by intradermal 2-0 Prolene, Steri-Strip, and sterile gauze dressing.   The wound margins were anesthetized with 2% lidocaine for postoperative  analgesia.   There were no apparent complications.   Ms. Barnhart was placed in a sling, transferred to recovery room with  stable vital signs.  Preoperatively, she had been counseled to begin  immediate range of motion exercises to the elbow, hand, wrist, and  shoulder in the postoperative period.   She will return for followup in 1 week.      Katy Fitch Sypher, M.D.  Electronically Signed     RVS/MEDQ  D:  12/20/2008  T:  12/20/2008  Job:  [id number]   cc:   Titus Dubin. Alwyn Ren, MD,FACP,FCCP

## 2010-09-26 ENCOUNTER — Telehealth: Payer: Self-pay | Admitting: *Deleted

## 2010-09-26 NOTE — Op Note (Signed)
NAME:  Erin Roach, Erin Roach                         ACCOUNT NO.:  [id number]   MEDICAL RECORD NO.:  [id number]                   PATIENT TYPE:  AMB   LOCATION:  DSC                                  FACILITY:  [facility]   PHYSICIAN:  Anselm Pancoast. Zachery Dakins, M.D.          DATE OF BIRTH:  [birth year]   DATE OF PROCEDURE:  02/02/2002  DATE OF DISCHARGE:                                 OPERATIVE REPORT   PREOPERATIVE DIAGNOSIS:  Recurrent Bowen's disease, pilonidal area.   PROCEDURE:  Wide excision with primary closure, recurrent squamous carcinoma  in situ.   ANESTHESIA:  General anesthesia, prone position.   SURGEON:  Anselm Pancoast. Zachery Dakins, M.D.   HISTORY:  The patient is a 70 year old Caucasian female who was referred to  me by Dr. Pilar Grammes of dermatology at [facility], who was  seeing one of Dr. Homero Fellers [place]'s patients, who had had a previous excision  of a lesion of Bowen's disease in the pilonidal area.  This appeared to have  been completely excised but then approximately a year and a half later,  after careful observation during the interim, seemed to promptly recur with  kind of a blistered, raised lesion that was tangentially biopsied and was  read as full-thickness atypia with probable carcinoma in situ.  She was  advised to see a general surgeon or a Engineer, petroleum and saw me shortly  afterwards.  On exam the little area was a little area about 1 cm by  slightly over a centimeter in the pilonidal area that was not an obvious  infection but kind of erythematous area, and I recommended that this be  completely excised with clean margins.  The patient is in agreement and was  scheduled for today.   DESCRIPTION OF PROCEDURE:  Preoperatively she was given 1 g of Kefzol, taken  to the operative suite, induction of general anesthesia, endotracheal tube,  and then turned into a prone position and the buttocks taped apart.  The  area was scrubbed with Betadine surgical scrub  and solution and then draped  in a sterile manner.  I was able to ellipse out the area of about 3/8 to 1/2  inch margin circumferentially, and I was using low-power magnification to  make sure that I was in normal-appearing skin, and then the margins  superiorly were marked with a long suture, the right side was marked with a  short suture for orientation, and then the skin and the underlying fatty  tissue was extended down deep sort of like a pilonidal incision, and then  this sent over for examination by Dr. Clelia Croft.  She did a frozen on it and  thinks the margins are clear and will wait on giving a final diagnosis of  the area.  I then basically while the pathology specimen was being examined  went ahead and first anesthetized the area with Marcaine and then closed it  in two  layers with 4-0 Vicryl in the deeper sutures, about four sutures to  bring it together edges, the lesion is about an inch and a half or a little  longer in length, and then closed the skin alternating simple 4-0 nylon  sutures with mattress 4-0 sutures.  The patient tolerated the procedure well  and after we had gotten confirmation that this had been completely excised,  we then rotated her, extubated her, and she went to the recovery room in a  stable postoperative condition.  The patient will be released after a short  stay in the recovery room with  instructions to be very inactive this weekend but hope to return to work on  Monday, and I will see her in the office for a wound check on Monday.  I am  going to keep her on Keflex 500 mg q.i.d. for about three days, and she will  have pain medications.                                                 Anselm Pancoast. Zachery Dakins, M.D.    WJW/MEDQ  D:  02/02/2002  T:  02/02/2002  Job:  [id number]   cc:   Pilar Grammes, M.D.

## 2010-09-26 NOTE — Telephone Encounter (Signed)
Pt called stating that her legs are hurting more now since starting the klonopin.  States she is tapering requip- and is currently on 1 requip every other day and 1 klonopin on these days, and 2 klonopin every other day.  States she has been unable to get to sleep until 4 am due to the pain, and has been having pain in the mornings which she did not have before.    Per Dr. Darrick Penna pt should resume previous dose of requip as switching to klonopin is not a good option for her.  He advised she may add a klonopin with original dose of requip to see if this is helpful, if it is not- she should stop the klonopin completely.  Pt expressed understanding.

## 2010-09-26 NOTE — Op Note (Signed)
NAME:  Erin Roach, Erin Roach               ACCOUNT NO.:  [id number]   MEDICAL RECORD NO.:  [id number]          PATIENT TYPE:  EMS   LOCATION:  ED                           FACILITY:  [facility]   PHYSICIAN:  Anselm Pancoast. Weatherly, M.D.DATE OF BIRTH:  11-Aug-1940   DATE OF PROCEDURE:  07/02/2005  DATE OF DISCHARGE:                                 OPERATIVE REPORT   PREOPERATIVE DIAGNOSIS:  Acute appendicitis, retrocecal.   POSTOPERATIVE DIAGNOSIS:  Acute retrocecal appendicitis.   PROCEDURE:  Open appendectomy.   ANESTHESIA:  General anesthesia.   SURGEON:  Anselm Pancoast. Zachery Dakins, M.D.   ASSISTANT:  Nurse.   HISTORY:  Erin Roach is a 70 year old female who presented to the  emergency room today with about a 24-hour history of progressive pain in the  right lower quadrant. She was seen by Dr. Read Drivers when she came in and he  questioned whether this was appendicitis. I thought that since she is 4 a  CT would be beneficial and this did show acutely inflamed appendix in a very  retrocecal location. The patient was given 3 grams of Unasyn and I discussed  with her the options of laparoscopic versus open and I would favor an open  appendectomy because of the appendix very retrocecal position.   The patient was taken over to the operative suite, induction of general  anesthesia. The abdomen was prepped with Betadine solution and draped in a  sterile manner. A small right lower quadrant incision was made, sharp  dissection down through the adipose tissue, Scarpa's fascia and then the  external oblique and internal oblique. The peritoneum and transversalis was  carefully picked up and opened into the peritoneal cavity. The cecum and  small bowel was immediately under the incision and this was kind of packed  off with a small lap and then you could feel the markedly inflamed appendix  seeing just the base of the appendix as it goes retrocecal. I opened up the  peritoneum so that I could try to  manipulate the appendix up into the wound.  First I could free the appendix up but did not actually take the mesentery  and later I went back and actually divided the appendiceal mesentery between  [facility]. I freed the appendix right on up to its junction with the cecum and  because of its location down deep and kind of a broad-based thought that a  TA-30 across the base of the appendix was better than trying to do a  pursestring and ligation. I did invaginate the suture line with interrupted  Lembert sutures of 3-0 silk. It was after this that I could free up the  appendiceal mesentery that was also inflamed. These were carefully divided  between hemostats and ligated with 2-0 Vicryl. We thoroughly irrigated and  aspirated. It appeared that hemostasis is good and then had a correct sponge  count and then closed the incision with a running 2-0 Vicryl on the  peritoneum and transversalis. The little internal oblique was kind of closed  with interrupted sutures of 2-0 Vicryl  and then the external oblique was  closed with interrupted sutures of 2-0 Vicryl also. Scarpa's fascia was  closed with 4-0 Vicryl and a couple of 4-0 Vicryl sutures and Benzoin and  Steri-Strips on the skin. The patient tolerated the procedure nicely and was  sent to the recovery room breathing spontaneously. I do want her to have  about 2 or 3 doses of IV antibiotics. It is now approximately  1:00 a.m. and we will probably keep her about 24 hours tomorrow and see how  she is feeling, whether she is feeling well enough to go home Friday  afternoon or possibly Saturday morning. Sponge and needle counts were  correct. Estimated blood loss was minimal.           ______________________________  Anselm Pancoast. Zachery Dakins, M.D.     WJW/MEDQ  D:  07/03/2005  T:  07/03/2005  Job:  [id number]

## 2010-09-26 NOTE — H&P (Signed)
NAME:  Erin Roach, Erin Roach               ACCOUNT NO.:  [id number]   MEDICAL RECORD NO.:  [id number]          PATIENT TYPE:  INP   LOCATION:  0098                         FACILITY:  [facility]   PHYSICIAN:  Anselm Pancoast. Zachery Dakins, M.D.DATE OF BIRTH:  1940-10-26   DATE OF ADMISSION:  07/02/2005  DATE OF DISCHARGE:                                HISTORY & PHYSICAL   CHIEF COMPLAINT:  Abdominal pain.   HISTORY:  Erin Roach is a 70 year old female who presented to the  emergency room today with about a 24-hour history of abdominal pain,  definitely more localized in the right lower quadrant today. She stated  yesterday she felt sort of queasy but this morning started definitely having  more pain and presented to the ER probably about 3 p.m. or 4 p.m. and the ER  physician on examination found that she was definitely tender in the right  lower quadrant and asked me if I thought we needed to do a CT or would we go  on clinical findings. I think since she is 70 that a CT would be quite  helpful, and this was performed and showed a retrocecal acutely-inflamed  appendix that did not appear to be actually ruptured. She was given 3 g of  Unasyn and permission obtained for open appendectomy since it is in a very  marked retrocecal location. The patient is on Vicodin. She fell and had a  humerus fracture, left, probably about 6 weeks ago and has Vicodin for that.  When she started having the pain yesterday she took some of the Vicodin.   FAMILY AND SOCIAL HISTORY:  The patient teaches school. No history of  chronic pulmonary problems. I do not think she smokes.   PAST MEDICAL HISTORY:  She said I have removed a small, I think, skin cancer  years earlier.   PHYSICAL EXAMINATION:  GENERAL:  She is a slight overweight Caucasian female  who appears her stated age and complaining of some tenderness in the right  lower quadrant. She has received Dilaudid intravenously prior to me seeing  her.  VITAL SIGNS:   Her temperature is 100.9, pulse was 101 when she first  presented, her blood pressure was 138/69. She appears adequately hydrated.  LUNGS:  Clear.  BREASTS:  I did not examine.  HEART:  Some mild sinus tachycardia.  ABDOMEN:  She is mildly tender real low in the right lower quadrant. Did not  find any tenderness on the upper or left abdomen side.  SKIN:  Unremarkable.  EXTREMITIES:  There is no pedal edema.   CT was viewed and it showed an acutely-inflamed retrocecal appendix with a  little bit of fluid around the appendix.   The patient was given 3 g of Unasyn and permission obtained for an open  appendectomy.           ______________________________  Anselm Pancoast. Zachery Dakins, M.D.     WJW/MEDQ  D:  07/03/2005  T:  07/03/2005  Job:  [id number]

## 2010-11-06 ENCOUNTER — Other Ambulatory Visit: Payer: Medicare Other

## 2010-11-14 ENCOUNTER — Other Ambulatory Visit: Payer: Self-pay | Admitting: Family Medicine

## 2010-11-14 DIAGNOSIS — T887XXA Unspecified adverse effect of drug or medicament, initial encounter: Secondary | ICD-10-CM

## 2010-11-14 DIAGNOSIS — E785 Hyperlipidemia, unspecified: Secondary | ICD-10-CM

## 2010-11-17 ENCOUNTER — Other Ambulatory Visit: Payer: Self-pay | Admitting: Internal Medicine

## 2010-11-17 ENCOUNTER — Other Ambulatory Visit (INDEPENDENT_AMBULATORY_CARE_PROVIDER_SITE_OTHER): Payer: Medicare Other

## 2010-11-17 DIAGNOSIS — T887XXA Unspecified adverse effect of drug or medicament, initial encounter: Secondary | ICD-10-CM

## 2010-11-17 DIAGNOSIS — E785 Hyperlipidemia, unspecified: Secondary | ICD-10-CM

## 2010-11-17 LAB — HEPATIC FUNCTION PANEL
AST: 17 U/L (ref 0–37)
Alkaline Phosphatase: 59 U/L (ref 39–117)
Total Bilirubin: 0.3 mg/dL (ref 0.3–1.2)

## 2010-11-17 LAB — LIPID PANEL
Cholesterol: 282 mg/dL — ABNORMAL HIGH (ref 0–200)
Total CHOL/HDL Ratio: 5
VLDL: 28.2 mg/dL (ref 0.0–40.0)

## 2010-11-17 NOTE — Progress Notes (Signed)
Labs only

## 2010-12-02 ENCOUNTER — Encounter: Payer: Self-pay | Admitting: Internal Medicine

## 2010-12-02 ENCOUNTER — Ambulatory Visit: Payer: Medicare Other | Admitting: Internal Medicine

## 2010-12-02 DIAGNOSIS — Z0289 Encounter for other administrative examinations: Secondary | ICD-10-CM

## 2010-12-11 ENCOUNTER — Encounter: Payer: Self-pay | Admitting: Internal Medicine

## 2010-12-11 ENCOUNTER — Ambulatory Visit (INDEPENDENT_AMBULATORY_CARE_PROVIDER_SITE_OTHER): Payer: Medicare Other | Admitting: Internal Medicine

## 2010-12-11 VITALS — BP 138/80 | HR 89 | Wt 186.4 lb

## 2010-12-11 DIAGNOSIS — E785 Hyperlipidemia, unspecified: Secondary | ICD-10-CM

## 2010-12-11 MED ORDER — NIACIN-SIMVASTATIN ER 500-20 MG PO TB24: 1.0000 | ORAL_TABLET | Freq: Every day | ORAL | Status: DC

## 2010-12-11 NOTE — Patient Instructions (Signed)
Eat a low-fat diet with lots of fruits and vegetables, up to 7-9 servings per day. Avoid obesity; your goal is waist measurement < 35 inches.Consume less than 30 grams of sugar per day from foods & drinks with High Fructose Corn Sugar as #1,2,3 or # 4 on label. Follow the low carb nutrition program in [employer] as closely as possible to prevent Diabetes progression & complications. White carbohydrates (potatoes, rice, bread, and pasta) have a high spike of sugar and a high load of sugar. For example a  baked potato has a cup of sugar and a  french fry  2 teaspoons of sugar. Yams, wild  rice, whole grained bread &  wheat pasta have been much lower spike and load of  sugar. Portions should be the size of a deck of cards or your palm.  Please  schedule fasting Labs : CK,Lipids, hepatic panel(272.4, 995.20)

## 2010-12-11 NOTE — Progress Notes (Signed)
  Subjective:    Patient ID: Erin Roach, female    DOB: [birth year], 70 y.o.   MRN: [id number]  HPI Dyslipidemia assessment: Prior Advanced Lipid Testing: NMR 2006: LDL goal = < 120.   Family history of premature CAD/ MI: no .  Nutrition: no plan .  Exercise: 2X- 3x/ week but variable Diabetes : A1c 5.8% . HTN: not checked . Smoking history  : quit @ 31 .   Weight :  Stable. Medication : not on statin now; unsure why D/Ced  Lab results reviewed :BHL : LDL  206, Lp(a) 96 (<50),CRP 5.6 but LpPLA2 is norma, insulin 14, T/T genotype. Not hyperabsorber    Review of Systems ROS: fatigue: no ; chest pain : no ;claudication: no; palpitations: no; abd pain/bowel changes: intermittent loose stool ; myalgias:some nocturnal leg cramps ( CK normal);  syncope : no ; memory loss: no;skin changes: no.     Objective:   Physical Exam Gen.: Healthy and well-nourished in appearance. Alert, appropriate and cooperative throughout exam. Eyes: No corneal or conjunctival inflammation noted. Extraocular motion intact. No lid lag Neck: No deformities, masses, or tenderness noted.  Thyroid normal. Lungs: Normal respiratory effort; chest expands symmetrically. Lungs are clear to auscultation without rales, wheezes, or increased work of breathing. Heart: Normal rate and rhythm. Normal S1 and S2. No gallop, click, or rub. No  murmur.                                                Musculoskeletal/extremities:  No clubbing, cyanosis,  or deformity noted.  Nail health  Good. Trace- 1/2 + edema of ankles Vascular: Carotid, radial artery, dorsalis pedis and  posterior tibial pulses are full and equal. ? Faint L carotid bruit present. Neurologic: Alert and oriented x3. Deep tendon reflexes symmetrical and normal.          Skin: Intact without suspicious lesions or rashes. Psych: Mood and affect are normal. Normally interactive                                                                                         Assessment & Plan:  #1 dyslipidemia; risk factors as listed above. No family history of premature coronary disease  Plan: Combination statin plus niacin with repeat labs in 10 weeks. Nutrition and exercise interventions.

## 2010-12-16 ENCOUNTER — Encounter: Payer: Self-pay | Admitting: Internal Medicine

## 2010-12-25 ENCOUNTER — Other Ambulatory Visit: Payer: Self-pay | Admitting: *Deleted

## 2010-12-25 MED ORDER — CLONAZEPAM 0.5 MG PO TABS: 0.5000 mg | ORAL_TABLET | Freq: Two times a day (BID) | ORAL | Status: DC

## 2011-01-07 ENCOUNTER — Ambulatory Visit (INDEPENDENT_AMBULATORY_CARE_PROVIDER_SITE_OTHER): Payer: Medicare Other | Admitting: Sports Medicine

## 2011-01-07 ENCOUNTER — Encounter: Payer: Self-pay | Admitting: Sports Medicine

## 2011-01-07 VITALS — BP 111/73 | HR 90

## 2011-01-07 DIAGNOSIS — M79609 Pain in unspecified limb: Secondary | ICD-10-CM

## 2011-01-07 DIAGNOSIS — G2581 Restless legs syndrome: Secondary | ICD-10-CM

## 2011-01-07 DIAGNOSIS — R609 Edema, unspecified: Secondary | ICD-10-CM

## 2011-01-07 DIAGNOSIS — G5700 Lesion of sciatic nerve, unspecified lower limb: Secondary | ICD-10-CM | POA: Insufficient documentation

## 2011-01-07 DIAGNOSIS — M79673 Pain in unspecified foot: Secondary | ICD-10-CM

## 2011-01-07 MED ORDER — JOBST 20-30MMHG COMPRESSION SM MISC: 1.0000 "application " | Freq: Every day | Status: DC

## 2011-01-07 NOTE — Progress Notes (Signed)
  Subjective:    Patient ID: Erin Roach, female    DOB: February 02, 1941, 70 y.o.   MRN: [id number]  HPI   Pt presents to clinic for f/uof bilateral foot pain.  Past and patient was here she was put in sports orthotics for scaphoid pad and metatarsal pad.  Patient states that this is relieved 95% of her pain patient is able to do all activities of daily living without much trouble. Patient would like a couple more pairs of orthotics at this visit. She is doing the standard exercises and stretches. She also feels best when walking shoes that have a sports insoles in place.  Patient was seen for restless leg syndrome as well as visit. She was put on Klonopin and noticed that she had improvement of her symptoms allowing her to sleep patient is very happy with where she is the management of this disease. Patient is also taking Requip and has been on it for a long time.  Patient is also complaining of intermittent left buttock pain. She states this coming does and does not notice any association with anything except for possibly longer duration of walking patient says she's had this problem off and on and she has friends who have the same problem and her seems minute compared to others. The patient would like to be checked out any. Patient states the pain usually stays in the buttocks with maybe a little bit of radiation down to the mid thigh posteriorly. Denies any numbness or weakness of the leg denies any back pain denies any type of bowel or bladder problems the   Review of Systems  All negative except as noted in the history of present illness     Objective:   Physical Exam  Gen: NAD    Foot exam. Callus at lt 2nd MTP no interval change Decreased motion of rt great toe  Hammering of the toes noted 3, 4 and 5 on the right Flattening of the transverse arch bilaterally No tenderness on palpation of the plantar fascia today.  Hip: ROM IR/ER: 80 Deg, IR/ER: 80 Deg, Flexion: 120 Deg,  Extension: 100 Deg, Abduction: 45 Deg, Adduction: 45 Deg Strength IR: 5/5, ER: 5/5, Flexion: 5/5, Extension: 5/5, Abduction: 5/5, Adduction: 5/5 Pelvic alignment unremarkable to inspection and palpation. Standing hip rotation and gait without trendelenburg / unsteadiness. Greater trochanter without tenderness to palpation. tenderness over piriformis and pain with cross over stretch  SI joint tenderness right side with PSIS deep on right. SLT and FABER negative.   Assessment & Plan:

## 2011-01-07 NOTE — Assessment & Plan Note (Signed)
Foot pain improved still consistent with breakdown a longitudinal arch. No signs of plantar fasciitis. Patient was given 2 new sets of sports insoles at this time but to correct changes made with scaphoid and metatarsal pad. Patient can return for this problem as needed.

## 2011-01-07 NOTE — Assessment & Plan Note (Signed)
When reviewing the chart note this patient is iron deficient. Iron deficiency does have a strong association with restless leg syndrome. At this time patient start iron 325 mg twice a day patient can continue her Klonopin and record as well

## 2011-01-07 NOTE — Patient Instructions (Signed)
I want you to try taking iron 325mg  twice a day to help with your restless leg syndrome.  If it makes you constipated go down to 1 time a day.  I am giving you some exercises for your piriformis syndrome to try to keep loose. You can also sit on a tennis ball in the car if it helps to massage it.  If the manipulation help you can always get a hold of me by calling here or [phone]. Come back in 4- 6 weeks.

## 2011-01-07 NOTE — Assessment & Plan Note (Addendum)
Patient's exam is consistent with piriformis syndrome. Patient has very good strength of her abductor muscles Patient after verbal consent did have some HVLA on side Patient given stretches to do at home. Told she can use a tennis ball as well for massage. Patient to return in 4 weeks if not better.

## 2011-05-21 ENCOUNTER — Other Ambulatory Visit: Payer: Self-pay | Admitting: Internal Medicine

## 2011-05-21 DIAGNOSIS — E785 Hyperlipidemia, unspecified: Secondary | ICD-10-CM

## 2011-05-21 MED ORDER — NIACIN-SIMVASTATIN ER 500-20 MG PO TB24: 1.0000 | ORAL_TABLET | Freq: Every day | ORAL | Status: DC

## 2011-05-21 NOTE — Telephone Encounter (Signed)
Patient will need to schedule: CK,Lipids, hepatic panel(272.4, 995.20)-Copied from 12/19/10 patient instructions

## 2011-05-27 ENCOUNTER — Other Ambulatory Visit: Payer: Self-pay

## 2011-05-27 DIAGNOSIS — E785 Hyperlipidemia, unspecified: Secondary | ICD-10-CM

## 2011-05-27 MED ORDER — NIACIN-SIMVASTATIN ER 500-20 MG PO TB24: 1.0000 | ORAL_TABLET | Freq: Every day | ORAL | Status: DC

## 2011-05-27 NOTE — Telephone Encounter (Signed)
CK,Lipids, hepatic panel(272.4, 995.20 Patient aware she is over-due for labs, patient states she is off med x 6 days because she ran out of med, ? If Dr.Hopper would like for her to have labs now although she was off med x 6 days.  Dr.Hopper please advise

## 2011-05-27 NOTE — Telephone Encounter (Signed)
Refill 90 pills; schedule for fasting labs in 10 weeks;Please  schedule fasting Labs : Lipids, hepatic panel, CK. PLEASE BRING THESE INSTRUCTIONS TO FOLLOW UP  LAB APPOINTMENT.This will guarantee correct labs are drawn, eliminating need for repeat blood sampling ( needle sticks ! ). Diagnoses /Codes: 161.0,960.45

## 2011-05-28 NOTE — Telephone Encounter (Signed)
LM for patient to call and schedule labs.

## 2011-06-11 ENCOUNTER — Other Ambulatory Visit: Payer: Medicare Other

## 2011-06-11 ENCOUNTER — Other Ambulatory Visit: Payer: Self-pay | Admitting: Internal Medicine

## 2011-06-11 DIAGNOSIS — T887XXA Unspecified adverse effect of drug or medicament, initial encounter: Secondary | ICD-10-CM

## 2011-06-11 DIAGNOSIS — E785 Hyperlipidemia, unspecified: Secondary | ICD-10-CM

## 2011-06-12 ENCOUNTER — Other Ambulatory Visit (INDEPENDENT_AMBULATORY_CARE_PROVIDER_SITE_OTHER): Payer: Medicare Other

## 2011-06-12 DIAGNOSIS — T887XXA Unspecified adverse effect of drug or medicament, initial encounter: Secondary | ICD-10-CM

## 2011-06-12 DIAGNOSIS — E785 Hyperlipidemia, unspecified: Secondary | ICD-10-CM

## 2011-06-12 LAB — HEPATIC FUNCTION PANEL
ALT: 18 U/L (ref 0–35)
Total Bilirubin: 0.5 mg/dL (ref 0.3–1.2)

## 2011-06-12 LAB — LIPID PANEL
HDL: 52.7 mg/dL (ref >39.00)
LDL Cholesterol: 118 mg/dL — ABNORMAL HIGH (ref 0–99)
Total CHOL/HDL Ratio: 3
VLDL: 13 mg/dL (ref 0.0–40.0)

## 2011-06-12 LAB — CK: Total CK: 53 U/L (ref 7–177)

## 2011-06-30 ENCOUNTER — Ambulatory Visit (INDEPENDENT_AMBULATORY_CARE_PROVIDER_SITE_OTHER): Payer: Medicare Other | Admitting: Sports Medicine

## 2011-06-30 ENCOUNTER — Other Ambulatory Visit: Payer: Self-pay | Admitting: Nurse Practitioner

## 2011-06-30 ENCOUNTER — Other Ambulatory Visit (HOSPITAL_COMMUNITY)
Admission: RE | Admit: 2011-06-30 | Discharge: 2011-06-30 | Disposition: A | Discharge status: Home / Self Care | Payer: Medicare Other | Source: Ambulatory Visit | Attending: Obstetrics and Gynecology | Admitting: Obstetrics and Gynecology

## 2011-06-30 VITALS — BP 128/70

## 2011-06-30 DIAGNOSIS — M79672 Pain in left foot: Secondary | ICD-10-CM

## 2011-06-30 DIAGNOSIS — M79671 Pain in right foot: Secondary | ICD-10-CM

## 2011-06-30 DIAGNOSIS — Z124 Encounter for screening for malignant neoplasm of cervix: Secondary | ICD-10-CM | POA: Insufficient documentation

## 2011-06-30 DIAGNOSIS — M79609 Pain in unspecified limb: Secondary | ICD-10-CM

## 2011-06-30 DIAGNOSIS — Z1159 Encounter for screening for other viral diseases: Secondary | ICD-10-CM | POA: Insufficient documentation

## 2011-06-30 MED ORDER — CLONAZEPAM 0.5 MG PO TABS: 0.5000 mg | ORAL_TABLET | Freq: Two times a day (BID) | ORAL | Status: DC

## 2011-06-30 NOTE — Progress Notes (Signed)
  Subjective:    Patient ID: Erin Roach, female    DOB: 1940/05/18, 71 y.o.   MRN: [id number]  HPI  Pt presents to clinic for new insoles. States she has been having no foot pain since using sports insoles with correction. Would like replacement pairs.    Review of Systems     Objective:   Physical Exam  Midfoot rotation and collapse Splaying 3-4 on rt, 2-3 on lt Hammering of 4-5 bilat Mild calcaneal valgus bilat      Assessment & Plan:

## 2011-07-02 ENCOUNTER — Other Ambulatory Visit: Payer: Self-pay | Admitting: Internal Medicine

## 2011-07-16 ENCOUNTER — Other Ambulatory Visit: Payer: Self-pay | Admitting: Cardiovascular Disease

## 2011-07-21 ENCOUNTER — Other Ambulatory Visit: Payer: Self-pay | Admitting: Cardiovascular Disease

## 2011-07-21 NOTE — Telephone Encounter (Signed)
FU Call: Pt calling regarding pt nexium refill being denied.  Please return pt call to discuss further.

## 2011-07-23 ENCOUNTER — Telehealth: Payer: Self-pay | Admitting: Cardiovascular Disease

## 2011-07-23 NOTE — Telephone Encounter (Signed)
90

## 2011-07-23 NOTE — Telephone Encounter (Signed)
New msg Pt said she called  over a week ago to get refill of nexium and it was denied and she wants to know what to do since Dr Clifton James prescribed this med. Please call

## 2011-07-23 NOTE — Telephone Encounter (Signed)
Spoke with pt and told her she only sees Dr. Clifton James as needed and GERD issues are addressed by Dr. Alwyn Ren and he should refill this med.  She is agreeable with this plan but is concerned because she is out of med and needs refills and is not sure how to proceed.  I called [facility] on [address] and asked them to contact Dr. Alwyn Ren for refill. Will route this note to Dr.Hopper's nurse to make her aware. Pt aware of this information.

## 2011-07-23 NOTE — Telephone Encounter (Signed)
Dr.Hopper please advise if you will agree to refill med (Nexium 40 mg bid) for in the past the most I have seen you fill nexium for is 40 mg once daily

## 2011-07-23 NOTE — Telephone Encounter (Signed)
Left message to call office. Per Dr hopper if med is needed bid Pt will need to f/u with GI regularly due to increase risk of osteopenia on once a day dosing which is increase with bid dosing. Pt can have some samples of med until she is able to get in with GI.

## 2011-07-24 ENCOUNTER — Telehealth: Payer: Self-pay | Admitting: *Deleted

## 2011-07-24 MED ORDER — ESOMEPRAZOLE MAGNESIUM 40 MG PO CPDR: 40.0000 mg | DELAYED_RELEASE_CAPSULE | Freq: Every day | ORAL | Status: DC

## 2011-07-24 NOTE — Telephone Encounter (Signed)
[facility] Triage Call Report Triage Record Num: [id number] Operator: Chevis Pretty Patient Name: Erin Roach Call Date & Time: 07/24/2011 8:57:14AM Patient Phone: [phone] PCP: Patient Gender: Female PCP Fax : Patient DOB: 04-Apr-1941 Practice Name: [facility] Reason for Call: Caller: [name]; PCP: Marga Melnick; CB#: [phone]; ; ; Call regarding Returning Call To Dr. Alwyn Ren s Nurse; Per Epic, info transferred between cardiology, PCP, and need for GI appt. Caller connected to office staff per office request. Protocol(s) Used: Office Note Recommended Outcome per Protocol: Information Noted and Sent to Office Reason for Outcome: Caller information to office Care Advice:

## 2011-07-24 NOTE — Telephone Encounter (Signed)
Spoke with patient, patient states she has always took med once daily although it stated BID. Patient is ok with med being rx'ed for once daily.

## 2011-07-24 NOTE — Telephone Encounter (Signed)
Use of proton pump inhibitor such as Nexium are associated with some possible risk of osteopenia or thinning of bones. Nexium twice a day  is sometimes used short term for significant reflux.If it is needed twice a long-term, GI followup  is necessary to rule out  significant processes such as esophageal stricture or severe gastritis.

## 2011-07-24 NOTE — Telephone Encounter (Signed)
This concern was addressed.

## 2011-08-05 ENCOUNTER — Other Ambulatory Visit: Payer: Self-pay | Admitting: Internal Medicine

## 2011-08-05 ENCOUNTER — Other Ambulatory Visit: Payer: Self-pay | Admitting: Sports Medicine

## 2011-08-21 ENCOUNTER — Ambulatory Visit
Admission: RE | Admit: 2011-08-21 | Discharge: 2011-08-21 | Disposition: A | Discharge status: Home / Self Care | Payer: Medicare Other | Source: Ambulatory Visit | Attending: Allergy and Immunology | Admitting: Allergy and Immunology

## 2011-08-21 ENCOUNTER — Other Ambulatory Visit: Payer: Self-pay | Admitting: Allergy and Immunology

## 2011-08-21 ENCOUNTER — Other Ambulatory Visit: Payer: Self-pay | Admitting: Internal Medicine

## 2011-08-21 DIAGNOSIS — R059 Cough, unspecified: Secondary | ICD-10-CM

## 2011-08-21 DIAGNOSIS — R05 Cough: Secondary | ICD-10-CM

## 2011-12-20 ENCOUNTER — Other Ambulatory Visit: Payer: Self-pay | Admitting: Internal Medicine

## 2011-12-21 NOTE — Telephone Encounter (Signed)
Patient needs to schedule a CPX  

## 2011-12-29 ENCOUNTER — Other Ambulatory Visit: Payer: Self-pay | Admitting: Sports Medicine

## 2012-02-06 ENCOUNTER — Encounter (HOSPITAL_COMMUNITY): Payer: Self-pay | Admitting: Adult Health

## 2012-02-06 ENCOUNTER — Observation Stay (HOSPITAL_COMMUNITY)
Admission: EM | Admit: 2012-02-06 | Discharge: 2012-02-08 | Disposition: A | Discharge status: Home / Self Care | Payer: Medicare Other | Attending: Internal Medicine | Admitting: Internal Medicine

## 2012-02-06 DIAGNOSIS — R079 Chest pain, unspecified: Principal | ICD-10-CM | POA: Diagnosis present

## 2012-02-06 DIAGNOSIS — G5700 Lesion of sciatic nerve, unspecified lower limb: Secondary | ICD-10-CM

## 2012-02-06 DIAGNOSIS — R0609 Other forms of dyspnea: Secondary | ICD-10-CM | POA: Insufficient documentation

## 2012-02-06 DIAGNOSIS — R32 Unspecified urinary incontinence: Secondary | ICD-10-CM

## 2012-02-06 DIAGNOSIS — G2581 Restless legs syndrome: Secondary | ICD-10-CM

## 2012-02-06 DIAGNOSIS — E039 Hypothyroidism, unspecified: Secondary | ICD-10-CM | POA: Insufficient documentation

## 2012-02-06 DIAGNOSIS — R0989 Other specified symptoms and signs involving the circulatory and respiratory systems: Secondary | ICD-10-CM | POA: Insufficient documentation

## 2012-02-06 DIAGNOSIS — R609 Edema, unspecified: Secondary | ICD-10-CM

## 2012-02-06 DIAGNOSIS — R5381 Other malaise: Secondary | ICD-10-CM | POA: Insufficient documentation

## 2012-02-06 DIAGNOSIS — E785 Hyperlipidemia, unspecified: Secondary | ICD-10-CM | POA: Insufficient documentation

## 2012-02-06 DIAGNOSIS — I872 Venous insufficiency (chronic) (peripheral): Secondary | ICD-10-CM

## 2012-02-06 DIAGNOSIS — R030 Elevated blood-pressure reading, without diagnosis of hypertension: Secondary | ICD-10-CM

## 2012-02-06 DIAGNOSIS — R5383 Other fatigue: Secondary | ICD-10-CM | POA: Diagnosis present

## 2012-02-06 DIAGNOSIS — Z85828 Personal history of other malignant neoplasm of skin: Secondary | ICD-10-CM

## 2012-02-06 DIAGNOSIS — K219 Gastro-esophageal reflux disease without esophagitis: Secondary | ICD-10-CM | POA: Diagnosis present

## 2012-02-06 DIAGNOSIS — G250 Essential tremor: Secondary | ICD-10-CM

## 2012-02-06 DIAGNOSIS — E1169 Type 2 diabetes mellitus with other specified complication: Secondary | ICD-10-CM | POA: Diagnosis present

## 2012-02-06 DIAGNOSIS — R072 Precordial pain: Secondary | ICD-10-CM

## 2012-02-06 DIAGNOSIS — J45909 Unspecified asthma, uncomplicated: Secondary | ICD-10-CM | POA: Diagnosis present

## 2012-02-06 DIAGNOSIS — M79609 Pain in unspecified limb: Secondary | ICD-10-CM

## 2012-02-06 LAB — CBC
Hemoglobin: 14 g/dL (ref 12.0–15.0)
MCV: 85.7 fL (ref 78.0–100.0)
Platelets: 265 10*3/uL (ref 150–400)
RBC: 4.61 MIL/uL (ref 3.87–5.11)
WBC: 8.3 10*3/uL (ref 4.0–10.5)

## 2012-02-06 LAB — BASIC METABOLIC PANEL
CO2: 24 mEq/L (ref 19–32)
Chloride: 103 mEq/L (ref 96–112)
Glucose, Bld: 110 mg/dL — ABNORMAL HIGH (ref 70–99)
Sodium: 139 mEq/L (ref 135–145)

## 2012-02-06 LAB — POCT I-STAT TROPONIN I: Troponin i, poc: 0 ng/mL (ref 0.00–0.08)

## 2012-02-06 MED ORDER — NITROGLYCERIN 0.4 MG SL SUBL
0.4000 mg | SUBLINGUAL_TABLET | Freq: Once | SUBLINGUAL | Status: AC
  Administered 2012-02-06: 0.4 mg via SUBLINGUAL
  Filled 2012-02-06: qty 25

## 2012-02-06 MED ORDER — ASPIRIN 325 MG PO TABS
325.0000 mg | ORAL_TABLET | ORAL | Status: AC
  Administered 2012-02-06: 325 mg via ORAL
  Filled 2012-02-06: qty 1

## 2012-02-06 NOTE — ED Provider Notes (Signed)
History     CSN: [id number]  Arrival date & time 02/06/12  2231   First MD Initiated Contact with Patient 02/06/12 2301      Chief Complaint  Patient presents with  . Chest Pain    (Consider location/radiation/quality/duration/timing/severity/associated sxs/prior treatment) Patient is a 71 y.o. female presenting with chest pain. The history is provided by the patient.  Chest Pain Primary symptoms include fatigue and shortness of breath. Pertinent negatives for primary symptoms include no abdominal pain, no nausea and no vomiting.  Pertinent negatives for associated symptoms include no numbness and no weakness.     patient presents with right-sided chest pain today. It is dull and constant. He went away after a nap, but otherwise has been there all day. She states she's rather severe. She's also had some pain to left side of her chest. She's had episodes of pain for last few months also. She's had more fatigue has not been able to do the same thing that she has been otherwise. Husband states she's had some diaphoresis. No cough. She cannot make the pain come on, however she's not been able to do the same physical activity she was previously.  Past Medical History  Diagnosis Date  . Venous insufficiency     legs  . Leg edema     bilat  . RLS (restless legs syndrome)   . Esophageal reflux   . Urinary incontinence   . Skin cancer     hx of squamous cell buttocks area, Bernerd Pho MD,Gen Surgery: Amy Swaziland MD, derm  . Asthma   . Snoring     Past Surgical History  Procedure Date  . Appendectomy   . Tonsillectomy   . Skin cancer removal   . Ulnar nerve surgery     Family History  Problem Relation Age of Onset  . Esophageal cancer Father   . Hyperlipidemia Mother   . Stroke Maternal Grandmother   . Heart attack Maternal Grandfather 93  . Heart attack Paternal Grandmother     58s    History  Substance Use Topics  . Smoking status: Former Games developer  . Smokeless tobacco:  Never Used   Comment: Quit at age 25  . Alcohol Use: Yes     socially    OB History    Grav Para Term Preterm Abortions TAB SAB Ect Mult Living                  Review of Systems  Constitutional: Positive for fatigue. Negative for activity change, appetite change and unexpected weight change.  HENT: Negative for neck stiffness.   Eyes: Negative for pain.  Respiratory: Positive for shortness of breath. Negative for chest tightness.   Cardiovascular: Positive for chest pain. Negative for leg swelling.  Gastrointestinal: Negative for nausea, vomiting, abdominal pain and diarrhea.  Genitourinary: Negative for flank pain.  Musculoskeletal: Negative for back pain.  Skin: Negative for rash.  Neurological: Negative for weakness, numbness and headaches.  Psychiatric/Behavioral: Negative for behavioral problems.    Allergies  Review of patient's allergies indicates no known allergies.  Home Medications   No current outpatient prescriptions on file.  BP 131/68  Pulse 80  Temp 97.9 F (36.6 C) (Oral)  Resp 20  Ht 5\' 6"  (1.676 m)  Wt 186 lb 8.2 oz (84.6 kg)  BMI 30.10 kg/m2  SpO2 93%  Physical Exam  Nursing note and vitals reviewed. Constitutional: She is oriented to person, place, and time. She appears well-developed and well-nourished.  HENT:  Head: Normocephalic and atraumatic.  Eyes: EOM are normal. Pupils are equal, round, and reactive to light.  Neck: Normal range of motion. Neck supple.  Cardiovascular: Normal rate, regular rhythm and normal heart sounds.   No murmur heard. Pulmonary/Chest: Effort normal and breath sounds normal. No respiratory distress. She has no wheezes. She has no rales. She exhibits tenderness.       Mild tenderness to left anterior chest. No rash.  Abdominal: Soft. Bowel sounds are normal. She exhibits no distension. There is no tenderness. There is no rebound and no guarding.  Musculoskeletal: Normal range of motion.  Neurological: She is  alert and oriented to person, place, and time. No cranial nerve deficit.  Skin: Skin is warm and dry.  Psychiatric: She has a normal mood and affect. Her speech is normal.    ED Course  Procedures (including critical care time)  Labs Reviewed  BASIC METABOLIC PANEL - Abnormal; Notable for the following:    Glucose, Bld 110 (*)     GFR calc non Af Amer 83 (*)     All other components within normal limits  D-DIMER, QUANTITATIVE - Abnormal; Notable for the following:    D-Dimer, Quant 0.82 (*)     All other components within normal limits  CBC  POCT I-STAT TROPONIN I  TROPONIN I  TROPONIN I  TROPONIN I  TSH   Dg Chest 2 View  02/07/2012  *RADIOLOGY REPORT*  Clinical Data: Right-sided chest pain  CHEST - 2 VIEW  Comparison: Chest radiograph 08/21/2011  Findings: Normal mediastinum and cardiac silhouette.  Normal pulmonary  vasculature.  No evidence of effusion, infiltrate, or pneumothorax.  No acute bony abnormality.  IMPRESSION: No acute cardiopulmonary process.   Original Report Authenticated By: Genevive Bi, M.D.    Ct Angio Chest W/cm &/or Wo Cm  02/07/2012  *RADIOLOGY REPORT*  Clinical Data: Chest pain and positive D-dimer.  CT ANGIOGRAPHY CHEST  Technique:  Multidetector CT imaging of the chest using the standard protocol during bolus administration of intravenous contrast. Multiplanar reconstructed images including MIPs were obtained and reviewed to evaluate the vascular anatomy.  Contrast: 80mL OMNIPAQUE IOHEXOL 350 MG/ML SOLN  Comparison: 01/21/2009  Findings: No evidence for pulmonary embolism.  Stable 1.3 cm low density structure in the lateral left hepatic lobe is likely an incidental finding.  There is a small hiatal hernia. Calcifications in the subcarinal region suggest old granulomatous disease.  There is no significant chest lymphadenopathy.  The trachea and mainstem bronchi are patent.  Mild scarring at the lung apices.  There is focal irregular linear density in the  anterior left upper lobe on series #5, image 16.  This has minimally changed since 2010 and probably represents an area of scarring.  There are a few linear densities in the lower lobes bilaterally that are suggestive for atelectasis or scarring. Multilevel degenerative changes in the thoracic spine.  No acute bony abnormality.  IMPRESSION: Negative for pulmonary embolism.  Stable density in the left upper lung has minimally changed since 2010 and suspect this represents a scar.  No acute chest findings.   Original Report Authenticated By: Richarda Overlie, M.D.      1. Chest pain      Date: 02/06/2012    Rate: 97  Rhythm: normal sinus rhythm  QRS Axis: normal  Intervals: normal  ST/T Wave abnormalities: normal  Conduction Disutrbances:none  Narrative Interpretation: Small Q waves inferiorly. Unchanged.  Old EKG Reviewed: unchanged    MDM  Patient with chest pain. Episode beginning today and was more severe but has been having for a while. She's also had fatigue and cannot do the same exercises she used to. Her pain is more in the right side but does go to the left side. EKG is reassuring. Lab work is reassuring except for a mildly elevated d-dimer. CT angio done was negative. Patient be admitted to medicine for chest pain rule out.        Juliet Rude. Rubin Payor, MD 02/07/12 0730

## 2012-02-06 NOTE — ED Notes (Signed)
Pt of Erin Roach.

## 2012-02-06 NOTE — ED Notes (Addendum)
Reports right sided chest pain that began a while ago,today noticed more fatigue and constant pressure and pain in right chest that radiates to left chest and to right jaw and  right arm associated with SOB and fatigue.   Nothing makes pain better, nothing makes pain worse.

## 2012-02-07 ENCOUNTER — Emergency Department (HOSPITAL_COMMUNITY): Payer: Medicare Other

## 2012-02-07 DIAGNOSIS — R5381 Other malaise: Secondary | ICD-10-CM

## 2012-02-07 DIAGNOSIS — R030 Elevated blood-pressure reading, without diagnosis of hypertension: Secondary | ICD-10-CM

## 2012-02-07 DIAGNOSIS — K219 Gastro-esophageal reflux disease without esophagitis: Secondary | ICD-10-CM

## 2012-02-07 DIAGNOSIS — R609 Edema, unspecified: Secondary | ICD-10-CM

## 2012-02-07 DIAGNOSIS — E785 Hyperlipidemia, unspecified: Secondary | ICD-10-CM

## 2012-02-07 DIAGNOSIS — R079 Chest pain, unspecified: Secondary | ICD-10-CM

## 2012-02-07 DIAGNOSIS — R5383 Other fatigue: Secondary | ICD-10-CM | POA: Diagnosis present

## 2012-02-07 DIAGNOSIS — R072 Precordial pain: Secondary | ICD-10-CM

## 2012-02-07 LAB — TROPONIN I
Troponin I: <0.3 ng/mL (ref <0.30)
Troponin I: <0.3 ng/mL (ref <0.30)

## 2012-02-07 LAB — TSH: TSH: 6.704 u[IU]/mL — ABNORMAL HIGH (ref 0.350–4.500)

## 2012-02-07 MED ORDER — PANTOPRAZOLE SODIUM 40 MG PO TBEC
80.0000 mg | DELAYED_RELEASE_TABLET | Freq: Every day | ORAL | Status: DC
  Administered 2012-02-07 – 2012-02-08 (×2): 80 mg via ORAL
  Filled 2012-02-07 (×2): qty 1

## 2012-02-07 MED ORDER — DARIFENACIN HYDROBROMIDE ER 15 MG PO TB24
15.0000 mg | ORAL_TABLET | Freq: Every day | ORAL | Status: DC
  Administered 2012-02-07 – 2012-02-08 (×2): 15 mg via ORAL
  Filled 2012-02-07 (×2): qty 1

## 2012-02-07 MED ORDER — SODIUM CHLORIDE 0.9 % IV SOLN
INTRAVENOUS | Status: DC
  Administered 2012-02-07: 75 mL/h via INTRAVENOUS
  Administered 2012-02-07: 500 mL via INTRAVENOUS
  Administered 2012-02-07: 15:00:00 via INTRAVENOUS
  Administered 2012-02-08: 1000 mL via INTRAVENOUS

## 2012-02-07 MED ORDER — ROPINIROLE HCL 1 MG PO TABS
1.0000 mg | ORAL_TABLET | Freq: Every day | ORAL | Status: DC
  Administered 2012-02-07: 1 mg via ORAL
  Filled 2012-02-07: qty 1

## 2012-02-07 MED ORDER — ZOLPIDEM TARTRATE 5 MG PO TABS: 5.0000 mg | ORAL_TABLET | Freq: Every evening | ORAL | Status: DC | PRN

## 2012-02-07 MED ORDER — MAGNESIUM OXIDE 400 (241.3 MG) MG PO TABS: 400.0000 mg | ORAL_TABLET | Freq: Every day | ORAL | Status: DC

## 2012-02-07 MED ORDER — CLONAZEPAM 0.5 MG PO TABS
0.5000 mg | ORAL_TABLET | Freq: Every day | ORAL | Status: DC
  Administered 2012-02-07: 0.5 mg via ORAL

## 2012-02-07 MED ORDER — ENOXAPARIN SODIUM 40 MG/0.4ML ~~LOC~~ SOLN
40.0000 mg | SUBCUTANEOUS | Status: DC
  Administered 2012-02-07 – 2012-02-08 (×2): 40 mg via SUBCUTANEOUS
  Filled 2012-02-07 (×3): qty 0.4

## 2012-02-07 MED ORDER — ASPIRIN 81 MG PO TABS: 81.0000 mg | ORAL_TABLET | Freq: Every day | ORAL | Status: DC

## 2012-02-07 MED ORDER — ROPINIROLE HCL 1 MG PO TABS: 2.0000 mg | ORAL_TABLET | ORAL | Status: DC

## 2012-02-07 MED ORDER — NIACIN-SIMVASTATIN ER 500-20 MG PO TB24: 1.0000 | ORAL_TABLET | Freq: Every day | ORAL | Status: DC

## 2012-02-07 MED ORDER — ASPIRIN EC 81 MG PO TBEC
81.0000 mg | DELAYED_RELEASE_TABLET | Freq: Every day | ORAL | Status: DC
  Administered 2012-02-07 – 2012-02-08 (×2): 81 mg via ORAL
  Filled 2012-02-07 (×2): qty 1

## 2012-02-07 MED ORDER — CLONAZEPAM 0.5 MG PO TABS
0.5000 mg | ORAL_TABLET | Freq: Every day | ORAL | Status: DC
  Filled 2012-02-07: qty 1

## 2012-02-07 MED ORDER — ROPINIROLE HCL 1 MG PO TABS
4.0000 mg | ORAL_TABLET | Freq: Every day | ORAL | Status: DC
  Administered 2012-02-07: 4 mg via ORAL
  Filled 2012-02-07 (×2): qty 4

## 2012-02-07 MED ORDER — MAGNESIUM 250 MG PO TABS: 1.0000 | ORAL_TABLET | Freq: Every day | ORAL | Status: DC

## 2012-02-07 MED ORDER — OXYCODONE HCL 5 MG PO TABS
5.0000 mg | ORAL_TABLET | ORAL | Status: DC | PRN
  Administered 2012-02-07 – 2012-02-08 (×3): 5 mg via ORAL
  Filled 2012-02-07 (×2): qty 1

## 2012-02-07 MED ORDER — IOHEXOL 350 MG/ML SOLN
80.0000 mL | Freq: Once | INTRAVENOUS | Status: AC | PRN
  Administered 2012-02-07: 80 mL via INTRAVENOUS

## 2012-02-07 MED ORDER — SODIUM CHLORIDE 0.9 % IJ SOLN
3.0000 mL | Freq: Two times a day (BID) | INTRAMUSCULAR | Status: DC
  Administered 2012-02-08: 3 mL via INTRAVENOUS

## 2012-02-07 MED ORDER — BUDESONIDE-FORMOTEROL FUMARATE 160-4.5 MCG/ACT IN AERO
2.0000 | INHALATION_SPRAY | Freq: Two times a day (BID) | RESPIRATORY_TRACT | Status: DC
  Administered 2012-02-07 (×2): 2 via RESPIRATORY_TRACT
  Filled 2012-02-07: qty 6

## 2012-02-07 MED ORDER — NITROGLYCERIN 2 % TD OINT
0.5000 [in_us] | TOPICAL_OINTMENT | Freq: Four times a day (QID) | TRANSDERMAL | Status: DC
  Administered 2012-02-07 – 2012-02-08 (×5): 0.5 [in_us] via TOPICAL
  Filled 2012-02-07: qty 1

## 2012-02-07 MED ORDER — MAGNESIUM OXIDE 400 (241.3 MG) MG PO TABS
400.0000 mg | ORAL_TABLET | Freq: Every day | ORAL | Status: DC
  Filled 2012-02-07: qty 1

## 2012-02-07 MED ORDER — NIACIN ER 500 MG PO CPCR
500.0000 mg | ORAL_CAPSULE | Freq: Every day | ORAL | Status: DC
  Administered 2012-02-07: 500 mg via ORAL
  Filled 2012-02-07 (×2): qty 1

## 2012-02-07 MED ORDER — ONDANSETRON HCL 4 MG PO TABS: 4.0000 mg | ORAL_TABLET | Freq: Four times a day (QID) | ORAL | Status: DC | PRN

## 2012-02-07 MED ORDER — CLONAZEPAM 0.5 MG PO TABS: 0.5000 mg | ORAL_TABLET | Freq: Every day | ORAL | Status: DC

## 2012-02-07 MED ORDER — ACETAMINOPHEN 650 MG RE SUPP: 650.0000 mg | Freq: Four times a day (QID) | RECTAL | Status: DC | PRN

## 2012-02-07 MED ORDER — ONDANSETRON HCL 4 MG/2ML IJ SOLN
4.0000 mg | Freq: Four times a day (QID) | INTRAMUSCULAR | Status: DC | PRN
  Administered 2012-02-07: 4 mg via INTRAVENOUS
  Filled 2012-02-07: qty 2

## 2012-02-07 MED ORDER — ASPIRIN 325 MG PO TABS
325.0000 mg | ORAL_TABLET | Freq: Every day | ORAL | Status: DC
  Administered 2012-02-07: 325 mg via ORAL
  Filled 2012-02-07: qty 1

## 2012-02-07 MED ORDER — SIMVASTATIN 20 MG PO TABS
20.0000 mg | ORAL_TABLET | Freq: Every day | ORAL | Status: DC
  Administered 2012-02-07: 20 mg via ORAL
  Filled 2012-02-07 (×2): qty 1

## 2012-02-07 MED ORDER — ROPINIROLE HCL 1 MG PO TABS
2.0000 mg | ORAL_TABLET | Freq: Every day | ORAL | Status: DC
  Filled 2012-02-07: qty 2

## 2012-02-07 MED ORDER — MAGNESIUM OXIDE 400 (241.3 MG) MG PO TABS
400.0000 mg | ORAL_TABLET | Freq: Every day | ORAL | Status: DC
  Administered 2012-02-07: 400 mg via ORAL
  Filled 2012-02-07 (×2): qty 1

## 2012-02-07 MED ORDER — ROPINIROLE HCL 1 MG PO TABS
2.0000 mg | ORAL_TABLET | Freq: Every day | ORAL | Status: DC | PRN
  Administered 2012-02-08: 2 mg via ORAL
  Filled 2012-02-07: qty 2

## 2012-02-07 MED ORDER — ROPINIROLE HCL 1 MG PO TABS
1.0000 mg | ORAL_TABLET | Freq: Every day | ORAL | Status: DC
  Filled 2012-02-07: qty 1

## 2012-02-07 MED ORDER — ACETAMINOPHEN 325 MG PO TABS
650.0000 mg | ORAL_TABLET | Freq: Four times a day (QID) | ORAL | Status: DC | PRN
  Administered 2012-02-07: 650 mg via ORAL
  Filled 2012-02-07: qty 2

## 2012-02-07 MED ORDER — SUCRALFATE 1 GM/10ML PO SUSP
1.0000 g | Freq: Three times a day (TID) | ORAL | Status: DC
  Administered 2012-02-07 – 2012-02-08 (×5): 1 g via ORAL
  Filled 2012-02-07 (×11): qty 10

## 2012-02-07 MED ORDER — ALUM & MAG HYDROXIDE-SIMETH 200-200-20 MG/5ML PO SUSP: 30.0000 mL | Freq: Four times a day (QID) | ORAL | Status: DC | PRN

## 2012-02-07 MED ORDER — HYDROMORPHONE HCL PF 1 MG/ML IJ SOLN
0.5000 mg | INTRAMUSCULAR | Status: DC | PRN
  Administered 2012-02-07: 1 mg via INTRAVENOUS
  Filled 2012-02-07: qty 1

## 2012-02-07 MED ORDER — SPIRONOLACTONE 25 MG PO TABS
25.0000 mg | ORAL_TABLET | Freq: Two times a day (BID) | ORAL | Status: DC
  Administered 2012-02-07 – 2012-02-08 (×3): 25 mg via ORAL
  Filled 2012-02-07 (×4): qty 1

## 2012-02-07 NOTE — Progress Notes (Signed)
Patient wants to know if there is any way that her stress test could be performed earlier, because if the test is scheduled later in the day of the 30th she is concerned that her blood glucose could drop, as a result of not having anything to eat. Harmon Pier

## 2012-02-07 NOTE — H&P (Addendum)
[facility] History and Physical  Erin Roach JYN:[id number] DOB: 09-Mar-1941 DOA: 02/06/2012  Referring physician: EDP PCP: Marga Melnick, MD   Chief Complaint: Chest Pain  HPI: Erin Roach is a 71 y.o. female who presents with complaints of right sided Chest pain worse today.  She has been having chest pain intermittently for a few weeks.  The pain is sharp and fleeting, but today the pain lasted longer and felt icy and burning.  The pain was in the right side of the chest but radiated into her jaw.  She denied having SOB or nausea or vomiting or diaphoresis associated with the pain.  She has also been having DOE, and Fatigue.    Review of Systems: The patient denies anorexia, fever, weight loss,, vision loss, decreased hearing, hoarseness, chest pain, syncope, dyspnea on exertion, peripheral edema, balance deficits, hemoptysis, abdominal pain, melena, hematochezia, severe indigestion/heartburn, hematuria, incontinence, genital sores, muscle weakness, suspicious skin lesions, transient blindness, difficulty walking, depression, unusual weight change, abnormal bleeding, enlarged lymph nodes, angioedema, and breast masses.    Past Medical History  Diagnosis Date  . Venous insufficiency     legs  . Leg edema     bilat  . RLS (restless legs syndrome)   . Esophageal reflux   . Urinary incontinence   . Skin cancer     hx of squamous cell buttocks area, Bernerd Pho MD,Gen Surgery: Amy Swaziland MD, derm  . Asthma   . Snoring    Past Surgical History  Procedure Date  . Appendectomy   . Tonsillectomy   . Skin cancer removal   . Ulnar nerve surgery     Medications:  HOME MEDS: Prior to Admission medications   Medication Sig Start Date End Date Taking? Authorizing Provider  aspirin 81 MG tablet Take 81 mg by mouth daily.    Yes Historical Provider, MD  budesonide-formoterol (SYMBICORT) 160-4.5 MCG/ACT inhaler Inhale 2 puffs into the lungs 2 (two) times daily.    Yes  Historical Provider, MD  Calcium Carbonate-Vitamin D (CALCIUM PLUS VITAMIN D PO) Take 1 tablet by mouth daily.    Yes Historical Provider, MD  clonazePAM (KLONOPIN) 0.5 MG tablet Take 0.5 mg by mouth daily.   Yes Historical Provider, MD  esomeprazole (NEXIUM) 40 MG capsule Take 40 mg by mouth daily. 07/24/11  Yes Pecola Lawless, MD  Magnesium 250 MG TABS Take 1 tablet by mouth daily.    Yes Historical Provider, MD  niacin-simvastatin (SIMCOR) 500-20 MG 24 hr tablet Take 1 tablet by mouth at bedtime.   Yes Historical Provider, MD  rOPINIRole (REQUIP) 2 MG tablet Take 2 mg by mouth See admin instructions. Takes 1 tablet with lunch and 1-2 tablets at bedtime for restless legs   Yes Historical Provider, MD  solifenacin (VESICARE) 10 MG tablet Take 10 mg by mouth daily. 08/22/10  Yes Pecola Lawless, MD  spironolactone (ALDACTONE) 25 MG tablet Take 25 mg by mouth 2 (two) times daily.   Yes Historical Provider, MD    No Known Allergies    Social History:  reports that she has quit smoking. She has never used smokeless tobacco. She reports that she drinks alcohol. She reports that she does not use illicit drugs.    Family History  Problem Relation Age of Onset  . Esophageal cancer Father   . Hyperlipidemia Mother   . Stroke Maternal Grandmother   . Heart attack Maternal Grandfather 93  . Heart attack Paternal Grandmother  60s     Physical Exam: Filed Vitals:   02/06/12 2246  BP: 132/68  Pulse: 90  Temp: 98.5 F (36.9 C)  TempSrc: Oral  Resp: 18  SpO2: 97%    Physical Exam:  GEN:  Pleasant  72 year old Obese well developed Caucasian Female examined  and in no acute distress; cooperative with exam Filed Vitals:   02/06/12 2246  BP: 132/68  Pulse: 90  Temp: 98.5 F (36.9 C)  TempSrc: Oral  Resp: 18  SpO2: 97%   Blood pressure 132/68, pulse 90, temperature 98.5 F (36.9 C), temperature source Oral, resp. rate 18, SpO2 97.00%. PSYCH: She is alert and oriented x4; does  not appear anxious does not appear depressed; affect is normal HEENT: Normocephalic and Atraumatic, Mucous membranes pink; PERRLA; EOM intact; Fundi:  Benign;  No scleral icterus, Nares: Patent, Oropharynx: Clear, Fair Dentition, Neck:  FROM, no cervical lymphadenopathy nor thyromegaly or carotid bruit; no JVD; Breasts:: Not examined CHEST WALL: No tenderness CHEST: Normal respiration, clear to auscultation bilaterally HEART: Regular rate and rhythm; no murmurs rubs or gallops BACK: No kyphosis or scoliosis; no CVA tenderness ABDOMEN: Positive Bowel Sounds, Obese, soft non-tender; no masses, no organomegaly, no pannus; no intertriginous candida. Rectal Exam: Not done EXTREMITIES: No bone or joint deformity; age-appropriate arthropathy of the hands and knees; no cyanosis, clubbing or edema; no ulcerations. Genitalia: not examined PULSES: 2+ and symmetric SKIN: Normal hydration no rash or ulceration CNS: Cranial nerves 2-12 grossly intact no focal neurologic deficit    Labs on Admission:  Basic Metabolic Panel:  Lab 02/06/12 4098  NA 139  K 4.0  CL 103  CO2 24  GLUCOSE 110*  BUN 16  CREATININE 0.79  CALCIUM 9.5  MG --  PHOS --   Liver Function Tests: No results found for this basename: AST:5,ALT:5,ALKPHOS:5,BILITOT:5,PROT:5,ALBUMIN:5 in the last 168 hours No results found for this basename: LIPASE:5,AMYLASE:5 in the last 168 hours No results found for this basename: AMMONIA:5 in the last 168 hours CBC:  Lab 02/06/12 2250  WBC 8.3  NEUTROABS --  HGB 14.0  HCT 39.5  MCV 85.7  PLT 265   Cardiac Enzymes: No results found for this basename: CKTOTAL:5,CKMB:5,CKMBINDEX:5,TROPONINI:5 in the last 168 hours  BNP (last 3 results) No results found for this basename: PROBNP:3 in the last 8760 hours CBG: No results found for this basename: GLUCAP:5 in the last 168 hours  Radiological Exams on Admission: Dg Chest 2 View  02/07/2012  *RADIOLOGY REPORT*  Clinical Data: Right-sided  chest pain  CHEST - 2 VIEW  Comparison: Chest radiograph 08/21/2011  Findings: Normal mediastinum and cardiac silhouette.  Normal pulmonary  vasculature.  No evidence of effusion, infiltrate, or pneumothorax.  No acute bony abnormality.  IMPRESSION: No acute cardiopulmonary process.   Original Report Authenticated By: Genevive Bi, M.D.    Ct Angio Chest W/cm &/or Wo Cm  02/07/2012  *RADIOLOGY REPORT*  Clinical Data: Chest pain and positive D-dimer.  CT ANGIOGRAPHY CHEST  Technique:  Multidetector CT imaging of the chest using the standard protocol during bolus administration of intravenous contrast. Multiplanar reconstructed images including MIPs were obtained and reviewed to evaluate the vascular anatomy.  Contrast: 80mL OMNIPAQUE IOHEXOL 350 MG/ML SOLN  Comparison: 01/21/2009  Findings: No evidence for pulmonary embolism.  Stable 1.3 cm low density structure in the lateral left hepatic lobe is likely an incidental finding.  There is a small hiatal hernia. Calcifications in the subcarinal region suggest old granulomatous disease.  There is  no significant chest lymphadenopathy.  The trachea and mainstem bronchi are patent.  Mild scarring at the lung apices.  There is focal irregular linear density in the anterior left upper lobe on series #5, image 16.  This has minimally changed since 2010 and probably represents an area of scarring.  There are a few linear densities in the lower lobes bilaterally that are suggestive for atelectasis or scarring. Multilevel degenerative changes in the thoracic spine.  No acute bony abnormality.  IMPRESSION: Negative for pulmonary embolism.  Stable density in the left upper lung has minimally changed since 2010 and suspect this represents a scar.  No acute chest findings.   Original Report Authenticated By: Richarda Overlie, M.D.     EKG: Independently reviewed. Normal sinus Rhythm, No Acute ST changes.      Assessment: Present on Admission:  .Chest  pain .Fatigue .HYPERLIPIDEMIA .ASTHMA .GERD (gastroesophageal reflux disease)    Plan:    Admit to Telemetry Observation Bed Check Troponins Nitropaste, O2, and ASA Reconcile Home Medications Liquid Carafate for GERD Check TSH level DVT prophylaxis    Code Status:  FULL CODE Family Communication:  Husband at Bedside Disposition Plan: Return to Home  Time spent: 60 minutes  Ron Parker [facility] Pager [phone]   If 7PM-7AM, please contact night-coverage [email or web address] Password [email or web address] 02/07/2012, 5:59 AM

## 2012-02-07 NOTE — Progress Notes (Signed)
Patient ID: Erin Roach  female  WUJ:[id number]    DOB: [birth year]    DOA: 02/06/2012  PCP: Marga Melnick, MD  Subjective: Patient seen and examined, chest pain improved, no nausea, vomiting, shortness of breath  Objective: Weight change:  No intake or output data in the 24 hours ending 02/07/12 1316 Blood pressure 131/68, pulse 80, temperature 97.9 F (36.6 C), temperature source Oral, resp. rate 20, height 5\' 6"  (1.676 m), weight 84.6 kg (186 lb 8.2 oz), SpO2 97.00%.  Physical Exam: General: Alert and awake, oriented x3, not in any acute distress. HEENT: anicteric sclera, pupils reactive to light and accommodation, EOMI CVS: S1-S2 clear, no murmur rubs or gallops Chest: clear to auscultation bilaterally, no wheezing, rales or rhonchi Abdomen: soft nontender, nondistended, normal bowel sounds, no organomegaly Extremities: no cyanosis, clubbing or edema noted bilaterally Neuro: Cranial nerves II-XII intact, no focal neurological deficits  Lab Results: Basic Metabolic Panel:  Lab 02/06/12 9562  NA 139  K 4.0  CL 103  CO2 24  GLUCOSE 110*  BUN 16  CREATININE 0.79  CALCIUM 9.5  MG --  PHOS --   Liver Function Tests: No results found for this basename: AST:2,ALT:2,ALKPHOS:2,BILITOT:2,PROT:2,ALBUMIN:2 in the last 168 hours No results found for this basename: LIPASE:2,AMYLASE:2 in the last 168 hours No results found for this basename: AMMONIA:2 in the last 168 hours CBC:  Lab 02/06/12 2250  WBC 8.3  NEUTROABS --  HGB 14.0  HCT 39.5  MCV 85.7  PLT 265   Cardiac Enzymes:  Lab 02/07/12 1140 02/07/12 0621  CKTOTAL -- --  CKMB -- --  CKMBINDEX -- --  TROPONINI <0.30 <0.30   BNP: No components found with this basename: POCBNP:2 CBG: No results found for this basename: GLUCAP:5 in the last 168 hours   Micro Results: No results found for this or any previous visit (from the past 240 hour(s)).  Studies/Results: Dg Chest 2 View  02/07/2012  *RADIOLOGY REPORT*   Clinical Data: Right-sided chest pain  CHEST - 2 VIEW  Comparison: Chest radiograph 08/21/2011  Findings: Normal mediastinum and cardiac silhouette.  Normal pulmonary  vasculature.  No evidence of effusion, infiltrate, or pneumothorax.  No acute bony abnormality.  IMPRESSION: No acute cardiopulmonary process.   Original Report Authenticated By: Genevive Bi, M.D.    Ct Angio Chest W/cm &/or Wo Cm  02/07/2012  *RADIOLOGY REPORT*  Clinical Data: Chest pain and positive D-dimer.  CT ANGIOGRAPHY CHEST  Technique:  Multidetector CT imaging of the chest using the standard protocol during bolus administration of intravenous contrast. Multiplanar reconstructed images including MIPs were obtained and reviewed to evaluate the vascular anatomy.  Contrast: 80mL OMNIPAQUE IOHEXOL 350 MG/ML SOLN  Comparison: 01/21/2009  Findings: No evidence for pulmonary embolism.  Stable 1.3 cm low density structure in the lateral left hepatic lobe is likely an incidental finding.  There is a small hiatal hernia. Calcifications in the subcarinal region suggest old granulomatous disease.  There is no significant chest lymphadenopathy.  The trachea and mainstem bronchi are patent.  Mild scarring at the lung apices.  There is focal irregular linear density in the anterior left upper lobe on series #5, image 16.  This has minimally changed since 2010 and probably represents an area of scarring.  There are a few linear densities in the lower lobes bilaterally that are suggestive for atelectasis or scarring. Multilevel degenerative changes in the thoracic spine.  No acute bony abnormality.  IMPRESSION: Negative for pulmonary embolism.  Stable density in  the left upper lung has minimally changed since 2010 and suspect this represents a scar.  No acute chest findings.   Original Report Authenticated By: Richarda Overlie, M.D.     Medications: Scheduled Meds:   . aspirin EC  81 mg Oral Daily  . aspirin  325 mg Oral STAT  . budesonide-formoterol   2 puff Inhalation BID  . clonazePAM  0.5 mg Oral Q supper  . darifenacin  15 mg Oral Daily  . enoxaparin (LOVENOX) injection  40 mg Subcutaneous Q24H  . magnesium oxide  400 mg Oral Q supper  . niacin  500 mg Oral QHS   And  . simvastatin  20 mg Oral QHS  . nitroGLYCERIN  0.5 inch Topical Q6H  . nitroGLYCERIN  0.4 mg Sublingual Once  . pantoprazole  80 mg Oral Q1200  . rOPINIRole  1 mg Oral Q lunch  . rOPINIRole  1 mg Oral QHS   Or  . rOPINIRole  2 mg Oral QHS  . sodium chloride  3 mL Intravenous Q12H  . spironolactone  25 mg Oral BID  . sucralfate  1 g Oral TID WC & HS  . DISCONTD: aspirin  325 mg Oral Daily  . DISCONTD: aspirin  81 mg Oral Daily  . DISCONTD: clonazePAM  0.5 mg Oral Daily  . DISCONTD: magnesium oxide  400 mg Oral Daily  . DISCONTD: Magnesium  1 tablet Oral Daily  . DISCONTD: niacin-simvastatin  1 tablet Oral QHS  . DISCONTD: rOPINIRole  2 mg Oral See admin instructions   Continuous Infusions:   . sodium chloride 75 mL/hr (02/07/12 4782)     Assessment/Plan: Principal Problem:  *Chest pain: midsternal - CT angiogram of the chest negative for PE - Cardiac enzymes negative so far for acute ACS - Nuclear medicine stress test tomorrow, discussed with [facility], N.p.o. After midnight  Active Problems:  HYPERLIPIDEMIA: obtain lipid panel  ASTHMA: stable  Fatigue  GERD (gastroesophageal reflux disease): continue PPI  DVT Prophylaxis:Lovenox  Code Status:full code status  Disposition:hopefully tomorrow if stress test negative   LOS: 1 day   RAI,RIPUDEEP M.D. [facility] 02/07/2012, 1:16 PM Pager: [phone]  If 7PM-7AM, please contact night-coverage [email or web address] Password [email or web address]

## 2012-02-08 ENCOUNTER — Observation Stay (HOSPITAL_COMMUNITY): Payer: Medicare Other

## 2012-02-08 DIAGNOSIS — R079 Chest pain, unspecified: Secondary | ICD-10-CM

## 2012-02-08 DIAGNOSIS — J45909 Unspecified asthma, uncomplicated: Secondary | ICD-10-CM

## 2012-02-08 LAB — BASIC METABOLIC PANEL
BUN: 17 mg/dL (ref 6–23)
CO2: 27 mEq/L (ref 19–32)
Chloride: 107 mEq/L (ref 96–112)
GFR calc Af Amer: >90 mL/min (ref >90)
Potassium: 4.6 mEq/L (ref 3.5–5.1)

## 2012-02-08 LAB — CBC
MCH: 29.5 pg (ref 26.0–34.0)
MCHC: 33.7 g/dL (ref 30.0–36.0)
Platelets: 203 10*3/uL (ref 150–400)
RBC: 4.37 MIL/uL (ref 3.87–5.11)

## 2012-02-08 MED ORDER — LEVOTHYROXINE SODIUM 25 MCG PO TABS: 25.0000 ug | ORAL_TABLET | Freq: Every day | ORAL | Status: DC

## 2012-02-08 MED ORDER — TECHNETIUM TC 99M SESTAMIBI - CARDIOLITE
30.0000 | Freq: Once | INTRAVENOUS | Status: AC | PRN
  Administered 2012-02-08: 30 via INTRAVENOUS

## 2012-02-08 MED ORDER — TECHNETIUM TC 99M SESTAMIBI GENERIC - CARDIOLITE
10.0000 | Freq: Once | INTRAVENOUS | Status: AC | PRN
  Administered 2012-02-08: 10 via INTRAVENOUS

## 2012-02-08 MED ORDER — LEVOTHYROXINE SODIUM 25 MCG PO TABS
25.0000 ug | ORAL_TABLET | Freq: Every day | ORAL | Status: DC
  Filled 2012-02-08 (×2): qty 1

## 2012-02-08 MED ORDER — OXYCODONE HCL 5 MG PO TABS
ORAL_TABLET | ORAL | Status: AC
  Filled 2012-02-08: qty 1

## 2012-02-08 MED ORDER — SUCRALFATE 1 GM/10ML PO SUSP: 1.0000 g | Freq: Three times a day (TID) | ORAL | Status: DC

## 2012-02-08 MED ORDER — NITROGLYCERIN 0.4 MG SL SUBL: 0.4000 mg | SUBLINGUAL_TABLET | SUBLINGUAL | Status: DC | PRN

## 2012-02-08 MED ORDER — PANTOPRAZOLE SODIUM 40 MG PO TBEC: 80.0000 mg | DELAYED_RELEASE_TABLET | Freq: Every day | ORAL | Status: DC

## 2012-02-08 NOTE — Progress Notes (Signed)
Stress myoview ordered by IM team. This AM pt feels well without complaints. EKG NSR nonspecific changes. CE's neg x 3. Exercised on treadmill and reached target HR with slight chest pressure; BP limited our ability to keep pushing pt. Pt reports hx of murmur but brief physical exam does not reveal any significant murmurs or AS. BP was elevated at baseline this AM. With her history of heart murmur and elevated BP pt is interested in 2D echo as originally ordered. This may be something to consider (can be done as OP if felt appropriate) with her concern. Note normal heart size on CXR. Results of nuc should be available by this afternoon. Chevelle Durr PA-C

## 2012-02-08 NOTE — Discharge Summary (Addendum)
Physician Discharge Summary  Patient ID: Erin Roach MRN: [id number] DOB/AGE: 1940/09/05 71 y.o.  Admit date: 02/06/2012 Discharge date: 02/08/2012  Primary Care Physician:  Marga Melnick, MD  Discharge Diagnoses:    .Chest pain resolved, stress test negative for any reversible ischemia .Fatigue .HYPERLIPIDEMIA .ASTHMA .GERD (gastroesophageal reflux disease) - chronic diarrhea resolved in patient Mild hypothyroidism  Consults:  [facility] cardiology for stress test  Discharge Medications:   Medication List     As of 02/08/2012  3:37 PM    STOP taking these medications         esomeprazole 40 MG capsule   Commonly known as: NEXIUM   Replaced by: pantoprazole 40 MG tablet      TAKE these medications         aspirin 81 MG tablet   Take 81 mg by mouth daily.      budesonide-formoterol 160-4.5 MCG/ACT inhaler   Commonly known as: SYMBICORT   Inhale 2 puffs into the lungs 2 (two) times daily.      CALCIUM PLUS VITAMIN D PO   Take 1 tablet by mouth daily.      clonazePAM 0.5 MG tablet   Commonly known as: KLONOPIN   Take 0.5 mg by mouth daily.      levothyroxine 25 MCG tablet   Commonly known as: SYNTHROID, LEVOTHROID   Take 1 tablet (25 mcg total) by mouth daily before breakfast.      Magnesium 250 MG Tabs   Take 1 tablet by mouth daily.      niacin-simvastatin 500-20 MG 24 hr tablet   Commonly known as: SIMCOR   Take 1 tablet by mouth at bedtime.      nitroGLYCERIN 0.4 MG SL tablet   Commonly known as: NITROSTAT   Place 1 tablet (0.4 mg total) under the tongue every 5 (five) minutes as needed for chest pain.      pantoprazole 40 MG tablet   Commonly known as: PROTONIX   Take 2 tablets (80 mg total) by mouth daily.      rOPINIRole 2 MG tablet   Commonly known as: REQUIP   Take 2-4 mg by mouth See admin instructions. Takes 4mg  daily with dinner. Takes 2mg  with breakfast as needed for restless for restless legs      solifenacin 10 MG tablet   Commonly  known as: VESICARE   Take 10 mg by mouth daily.      spironolactone 25 MG tablet   Commonly known as: ALDACTONE   Take 25 mg by mouth 2 (two) times daily.      sucralfate 1 GM/10ML suspension   Commonly known as: CARAFATE   Take 10 mLs (1 g total) by mouth 4 (four) times daily -  with meals and at bedtime.         Brief H and P: For complete details please refer to admission H and P, but in brief patient is a 71 year old female who presented with complaints of right-sided chest pain worse on the day of admission. Patient had been having chest pains intermittently for a few weeks, felt icy and burning but radiating to the jaw. She was admitted for further evaluation   Hospital Course:    Chest pain: midsternal, resolved. Patient was admitted to telemetry floor, serial cardiac enzymes remained negative for acute ACS. CT angiogram of the chest was done which was negative for acute PE. Nuclear medicine stress test was done and showed no segmental wall motion abnormalities, EF of 73%.  Patient was also placed on PPI and I recommended her to follow up with her gastroenterologist, Dr. Loreta Ave if she still continues to have intermittent chest pains to undergo endoscopy.   Fatigue: due to patient's complaint of always feeling exhausted and fatigued I ordered TSH which was mildly elevated at 6.7.  I ordered to have T4 and T3 labs to be followed by PCP, and requested to be started on low-dose Synthroid and she will follow up with repeat TSH and labs in 4 weeks.  GERD (gastroesophageal reflux disease): continue PPI   Chronic diarrhea: during admission patient reported chronic diarrhea about 3 times in a day which was completely resolved while inpatient, I suspect likely food allergy, lactose intolerance or a side effect to some medication. I sent Celiac panel and results are pending at DC. Per her request I placed her on Protonix instead of Nexium.   Day of Discharge BP 132/83  Pulse 89  Temp 98.8  F (37.1 C) (Oral)  Resp 20  Ht 5\' 6"  (1.676 m)  Wt 84.6 kg (186 lb 8.2 oz)  BMI 30.10 kg/m2  SpO2 94%  Physical Exam: General: Alert and awake oriented x3 not in any acute distress. HEENT: anicteric sclera, pupils reactive to light and accommodation CVS: S1-S2 clear no murmur rubs or gallops Chest: clear to auscultation bilaterally, no wheezing rales or rhonchi Abdomen: soft nontender, nondistended, normal bowel sounds, no organomegaly Extremities: no cyanosis, clubbing or edema noted bilaterally Neuro: Cranial nerves II-XII intact, no focal neurological deficits   The results of significant diagnostics from this hospitalization (including imaging, microbiology, ancillary and laboratory) are listed below for reference.    LAB RESULTS: Basic Metabolic Panel:  Lab 02/08/12 1610 02/06/12 2250  NA 139 139  K 4.6 4.0  CL 107 103  CO2 27 24  GLUCOSE 100* 110*  BUN 17 16  CREATININE 0.74 0.79  CALCIUM 8.9 9.5  MG -- --  PHOS -- --   CBC:  Lab 02/08/12 0450 02/06/12 2250  WBC 6.2 8.3  NEUTROABS -- --  HGB 12.9 14.0  HCT 38.3 39.5  MCV 87.6 --  PLT 203 265   Cardiac Enzymes:  Lab 02/07/12 1804 02/07/12 1140  CKTOTAL -- --  CKMB -- --  CKMBINDEX -- --  TROPONINI <0.30 <0.30    Significant Diagnostic Studies:  Dg Chest 2 View  02/07/2012  *RADIOLOGY REPORT*  Clinical Data: Right-sided chest pain  CHEST - 2 VIEW  Comparison: Chest radiograph 08/21/2011  Findings: Normal mediastinum and cardiac silhouette.  Normal pulmonary  vasculature.  No evidence of effusion, infiltrate, or pneumothorax.  No acute bony abnormality.  IMPRESSION: No acute cardiopulmonary process.   Original Report Authenticated By: Genevive Bi, M.D.    Ct Angio Chest W/cm &/or Wo Cm  02/07/2012  *RADIOLOGY REPORT*  Clinical Data: Chest pain and positive D-dimer.  CT ANGIOGRAPHY CHEST  Technique:  Multidetector CT imaging of the chest using the standard protocol during bolus administration of  intravenous contrast. Multiplanar reconstructed images including MIPs were obtained and reviewed to evaluate the vascular anatomy.  Contrast: 80mL OMNIPAQUE IOHEXOL 350 MG/ML SOLN  Comparison: 01/21/2009  Findings: No evidence for pulmonary embolism.  Stable 1.3 cm low density structure in the lateral left hepatic lobe is likely an incidental finding.  There is a small hiatal hernia. Calcifications in the subcarinal region suggest old granulomatous disease.  There is no significant chest lymphadenopathy.  The trachea and mainstem bronchi are patent.  Mild scarring at the lung  apices.  There is focal irregular linear density in the anterior left upper lobe on series #5, image 16.  This has minimally changed since 2010 and probably represents an area of scarring.  There are a few linear densities in the lower lobes bilaterally that are suggestive for atelectasis or scarring. Multilevel degenerative changes in the thoracic spine.  No acute bony abnormality.  IMPRESSION: Negative for pulmonary embolism.  Stable density in the left upper lung has minimally changed since 2010 and suspect this represents a scar.  No acute chest findings.   Original Report Authenticated By: Richarda Overlie, M.D.      Disposition and Follow-up:     Discharge Orders    Future Orders Please Complete By Expires   Diet - low sodium heart healthy      Increase activity slowly      Discharge instructions      Comments:   Please f/u with Dr Charna Elizabeth if you have any intermittent chest pains for possible endoscopy. Get your thyroid labs rechecked in 4 weeks.       DISPOSITION: Home DIET:heart healthy  ACTIVITY: as tolerated TESTS THAT NEED FOLLOW-UP Thyroid panel in 4 weeks  DISCHARGE FOLLOW-UP Follow-up Information    Follow up with Marga Melnick, MD. Schedule an appointment as soon as possible for a visit in 2 weeks. (for hospital follow-up)    Contact information:   [address] [facility] [address] [place]  [place] [address] [phone]       Follow up with Charna Elizabeth, MD. Schedule an appointment as soon as possible for a visit in 1 month. (if still having chest pains on protonix )    Contact information:   [address], [place] [address] [phone]          Time spent on Discharge: 35 mins  Signed:   RAI,RIPUDEEP M.D. [facility] 02/08/2012, 3:37 PM Pager: [phone]

## 2012-02-08 NOTE — Care Management Note (Unsigned)
    Page 1 of 1   02/08/2012     9:13:08 AM   CARE MANAGEMENT NOTE 02/08/2012  Patient:  Erin Roach, Erin Roach   Account Number:  [id number]  Date Initiated:  02/08/2012  Documentation initiated by:  SIMMONS,Zali Kamaka  Subjective/Objective Assessment:   ADMITTED WITH CP; LIVES AT HOME WITH HER HUSBAND- TED; WAS IPTA, NO DME.     Action/Plan:   DISCHARGE PLANNING DISCUSSED AT BEDSIDE.   Anticipated DC Date:  02/08/2012   Anticipated DC Plan:  HOME/SELF CARE      DC Planning Services  CM consult      Choice offered to / List presented to:             Status of service:  In process, will continue to follow Medicare Important Message given?   (If response is "NO", the following Medicare IM given date fields will be blank) Date Medicare IM given:   Date Additional Medicare IM given:    Discharge Disposition:    Per UR Regulation:  Reviewed for med. necessity/level of care/duration of stay  If discussed at Long Length of Stay Meetings, dates discussed:    Comments:  02/08/12  0856  Jayd Cadieux SIMMONS RN, BSN [phone] NCM WILL FOLLOW.

## 2012-02-09 LAB — T4, FREE: Free T4: 0.86 ng/dL (ref 0.80–1.80)

## 2012-02-09 LAB — T3: T3, Total: 94.2 ng/dl (ref 80.0–204.0)

## 2012-02-09 LAB — RETICULIN ANTIBODIES, IGA W TITER: Reticulin Ab, IgA: NEGATIVE

## 2012-02-16 ENCOUNTER — Other Ambulatory Visit: Payer: Self-pay | Admitting: Internal Medicine

## 2012-02-17 NOTE — Telephone Encounter (Signed)
Patient needs to schedule a CPX  

## 2012-03-11 ENCOUNTER — Other Ambulatory Visit: Payer: Self-pay | Admitting: *Deleted

## 2012-03-11 MED ORDER — CLONAZEPAM 0.5 MG PO TABS: 0.5000 mg | ORAL_TABLET | Freq: Two times a day (BID) | ORAL | Status: DC

## 2012-03-11 NOTE — Progress Notes (Signed)
Called and scheduled pt for f/u 04/05/12.

## 2012-03-13 ENCOUNTER — Other Ambulatory Visit: Payer: Self-pay | Admitting: Internal Medicine

## 2012-03-25 ENCOUNTER — Telehealth: Payer: Self-pay | Admitting: Internal Medicine

## 2012-03-25 ENCOUNTER — Other Ambulatory Visit: Payer: Self-pay | Admitting: Internal Medicine

## 2012-03-25 DIAGNOSIS — R0683 Snoring: Secondary | ICD-10-CM

## 2012-03-25 DIAGNOSIS — G2581 Restless legs syndrome: Secondary | ICD-10-CM

## 2012-03-25 NOTE — Telephone Encounter (Signed)
Hopp please advise  

## 2012-03-25 NOTE — Telephone Encounter (Signed)
Pt called his morning and would like a referral for a sleep study. Pt stated she has spoken to [place] about this  CB# [phone]

## 2012-04-04 ENCOUNTER — Other Ambulatory Visit: Payer: Self-pay | Admitting: Internal Medicine

## 2012-04-05 ENCOUNTER — Other Ambulatory Visit: Payer: Self-pay | Admitting: *Deleted

## 2012-04-05 ENCOUNTER — Ambulatory Visit: Payer: Medicare Other | Admitting: Sports Medicine

## 2012-04-05 MED ORDER — CLONAZEPAM 0.5 MG PO TABS: 0.5000 mg | ORAL_TABLET | Freq: Two times a day (BID) | ORAL | Status: DC

## 2012-04-05 NOTE — Telephone Encounter (Signed)
Patient needs to schedule a CPX  

## 2012-04-12 ENCOUNTER — Other Ambulatory Visit (INDEPENDENT_AMBULATORY_CARE_PROVIDER_SITE_OTHER): Payer: Medicare Other

## 2012-04-12 ENCOUNTER — Ambulatory Visit (INDEPENDENT_AMBULATORY_CARE_PROVIDER_SITE_OTHER): Payer: Medicare Other | Admitting: Pulmonary Disease

## 2012-04-12 ENCOUNTER — Encounter: Payer: Self-pay | Admitting: Pulmonary Disease

## 2012-04-12 VITALS — BP 136/78 | HR 108 | Temp 99.0°F | Ht 66.0 in | Wt 186.8 lb

## 2012-04-12 DIAGNOSIS — G2581 Restless legs syndrome: Secondary | ICD-10-CM

## 2012-04-12 DIAGNOSIS — R0989 Other specified symptoms and signs involving the circulatory and respiratory systems: Secondary | ICD-10-CM

## 2012-04-12 DIAGNOSIS — R0609 Other forms of dyspnea: Secondary | ICD-10-CM

## 2012-04-12 DIAGNOSIS — D509 Iron deficiency anemia, unspecified: Secondary | ICD-10-CM

## 2012-04-12 DIAGNOSIS — E611 Iron deficiency: Secondary | ICD-10-CM | POA: Insufficient documentation

## 2012-04-12 DIAGNOSIS — Z23 Encounter for immunization: Secondary | ICD-10-CM

## 2012-04-12 MED ORDER — PRAMIPEXOLE DIHYDROCHLORIDE 0.25 MG PO TABS: ORAL_TABLET | ORAL | Status: DC

## 2012-04-12 NOTE — Progress Notes (Signed)
Subjective:    Patient ID: Erin Roach, female    DOB: [birth year], 71 y.o.   MRN: [id number]  HPI The pt is a 71 y/o female who I have been asked to see for management of RLS and possible OSA.  The pt has been diagnosed with RLS since age 63 after a pregnancy, but also has a history of iron deficiency in the past.  She typically has issues in late afternoon, and also during the middle of the night.  She describes a burning sensation behind her legs, and movement clearly makes it better.  She has also been told that she kicks a lot during the night. Surprisingly, exercise does not make her overall symptoms better.  She has been on requip for years, and although it has helped, she feels her symptoms are worsening.  She has also been taking low dose klonopine at bedtime, but isn't sure it is doing anything for her.  She will on rare occasions take 1/2 of a "pain pill" if the pain is severe, and this helps quite a bit.  She may have been on neurontin in the past.  She also has been on iron supplements in the past for iron deficiency??.  Complicating this, the pt has been told that she has loud snoring, but no one has mentioned an abnormal breathing pattern during sleep.  She does have frequent awakenings at night, and is not rested in the am's upon arising.  She does have signficant sleep pressure during the day, and can fall asleep with inactivity.  Her weight is up 10 pounds over the last one year, and her epworth score today is 12.  Sleep Questionnaire: What time do you typically go to bed?( Between what hours) 10pm How long does it take you to fall asleep? 5 minutes How many times during the night do you wake up? 6 What time do you get out of bed to start your day? 0500 Do you drive or operate heavy machinery in your occupation? No How much has your weight changed (up or down) over the past two years? (In pounds) 10 lb (4.536 kg) Have you ever had a sleep study before? No Do you currently use CPAP? No Do  you wear oxygen at any time? No    Review of Systems  Constitutional: Negative for fever and unexpected weight change.  HENT: Negative for ear pain, nosebleeds, congestion, sore throat, rhinorrhea, sneezing, trouble swallowing, dental problem, postnasal drip and sinus pressure.   Eyes: Negative for redness and itching.  Respiratory: Positive for cough ( non productive). Negative for chest tightness, shortness of breath and wheezing.   Cardiovascular: Positive for leg swelling ( feet swelling/hand). Negative for palpitations.       Heart murmur  Gastrointestinal: Negative for nausea and vomiting.  Genitourinary: Negative for dysuria.  Musculoskeletal: Negative for joint swelling.  Skin: Negative for rash.  Neurological: Negative for headaches.  Hematological: Does not bruise/bleed easily.  Psychiatric/Behavioral: Negative for dysphoric mood. The patient is not nervous/anxious.        Objective:   Physical Exam Constitutional:  Overweight female, no acute distress  HENT:  Nares patent without discharge  Oropharynx without exudate, palate and uvula are normal  Eyes:  Perrla, eomi, no scleral icterus  Neck:  No JVD, no TMG  Cardiovascular:  Normal rate, regular rhythm, no rubs or gallops.  No murmurs        Intact distal pulses  Pulmonary :  Normal breath sounds, no stridor  or respiratory distress   No rales, rhonchi, or wheezing  Abdominal:  Soft, nondistended, bowel sounds present.  No tenderness noted.   Musculoskeletal:  mild lower extremity edema noted.  Lymph Nodes:  No cervical lymphadenopathy noted  Skin:  No cyanosis noted  Neurologic:  Alert, appropriate, moves all 4 extremities without obvious deficit.         Assessment & Plan:

## 2012-04-12 NOTE — Assessment & Plan Note (Signed)
The patient has a long history of RLS that in the past has responded adequately to Requip.  She is now having significant breakthrough symptoms but is greatly affecting her sleep and quality of life.  She is on a fairly high dose of Requip as well as taking Klonopin at bedtime, and continues to have breakthrough.  I suspect this is not a rebound phenomenon, and would like to try her on a different dopamine agonist to see if this will help.  She also has a history of iron deficiency, and has not been tested in quite some time.  This is a very common cause of restless legs, and can also worsen her symptoms.  Finally, if she continues to have issues despite trying different medications, I would give some thought as to whether this may represent some other process than restless legs.  This can include neuropathy and possibly neuropathic pain associated with spine disease.

## 2012-04-12 NOTE — Patient Instructions (Addendum)
Will stop requip and start in its place mirapex.  Take 0.25mg  one in afternoon, and one sometime after dinner.  Let me know if symptoms worsen or if you have side effects.  Ok to take pain med if you are having severe symptoms. Stay on klonopine for now, but will likely try to get you off this. Will give you the pneumovax today. Will check iron panel, and call you with results.  Give me some feedback in 3 weeks by telephone if you think this is helpful.

## 2012-04-12 NOTE — Assessment & Plan Note (Signed)
The patient has been noted to have loud snoring, but no one has commented on an abnormal breathing pattern during sleep.  She has frequent awakenings at night, and is on rested in the mornings, however she clearly has significant issues with probable restless leg syndrome.  I would like to try and improve her RLS symptoms, and didn't see if her overall sleep is improved.  She may ultimately need a sleep study for evaluation.

## 2012-04-13 ENCOUNTER — Telehealth: Payer: Self-pay | Admitting: *Deleted

## 2012-04-13 NOTE — Telephone Encounter (Signed)
It is possible.  What she is describing sounds viral.  Would drink plenty fluids and give this 24hrs.  Can take tylenol for fever.  Let us know if significant reaction at injection site.

## 2012-04-13 NOTE — Telephone Encounter (Signed)
Spoke with pt's spouse and notified of recs per [facility] He verbalized understanding and states nothing further needed

## 2012-04-13 NOTE — Telephone Encounter (Signed)
Called the pt to give her iron panel results and she stated that around 7 pm last night she began having diarrhea and chills. Fever has been as high as 103.7 today. She says she felt fine when here for OV and would like to know if this could have been caused from the Pneumovax she received while here. KC, pls advise of any recs.

## 2012-04-21 ENCOUNTER — Ambulatory Visit: Payer: Medicare Other | Admitting: Sports Medicine

## 2012-04-21 ENCOUNTER — Telehealth: Payer: Self-pay | Admitting: Pulmonary Disease

## 2012-04-21 NOTE — Telephone Encounter (Signed)
(  continued)  Pt stated that previously she was taking the generic of requip and has some of this med left.  Should pt take this while waiting on a refill for the pramipexole?  Pt knows she isn't due for a refill for a few more weeks.  Pt states she may not be available this afternoon & asked if we could please leave a message if she does not answer.  Erin Roach

## 2012-04-21 NOTE — Telephone Encounter (Signed)
LMTCB

## 2012-04-22 NOTE — Telephone Encounter (Signed)
lmomtcb x 2  

## 2012-04-25 NOTE — Telephone Encounter (Signed)
See if she thinks mirapex works better than requip, or about the same or less? If she thinks is better, would like her to try one in afternoon and 2 after dinner to see if this will get her thru the night. Can call in script for 3 a day if ok with her (30 days, no fills so she can see if this works better.  Then we can call in prolonged script if doing well).   Let me know what she thinks.

## 2012-04-25 NOTE — Telephone Encounter (Signed)
Spoke with pt. She was seen by [facility] last on 04/12/12 and below are recs from that visit   Patient Instructions     Will stop requip and start in its place mirapex. Take 0.25mg  one in afternoon, and one sometime after dinner. Let me know if symptoms worsen or if you have side effects. Ok to take pain med if you are having severe symptoms.  Stay on klonopine for now, but will likely try to get you off this.  Will give you the pneumovax today.  Will check iron panel, and call you with results.  Give me some feedback in 3 weeks by telephone if you think this is helpful.        Pt states that she was doing well on the mirapex, although she did tend to wake up around 3 am with leg pain and had to take a "pain pill" to get back to sleep. She states that she lost her bottle of mirapex at a hotel and so she is out of med. She still has plenty of the requip 2 mg and wants to know if she should just take this until it is time to get her mirapex refilled again. Please advise, thanks

## 2012-04-25 NOTE — Telephone Encounter (Signed)
LMTCB

## 2012-04-25 NOTE — Telephone Encounter (Signed)
LMOMTCB x 1 

## 2012-04-25 NOTE — Telephone Encounter (Signed)
Returning call.Erin Roach ° °

## 2012-04-26 MED ORDER — PRAMIPEXOLE DIHYDROCHLORIDE 0.25 MG PO TABS: ORAL_TABLET | ORAL | Status: DC

## 2012-04-26 NOTE — Telephone Encounter (Signed)
I would try to back off on this and see if she can get by on mirapex alone.  She could take every other night for about 10 days, then stop.

## 2012-04-26 NOTE — Telephone Encounter (Signed)
Pt aware of KC recs and verbalized understanding.

## 2012-04-26 NOTE — Telephone Encounter (Signed)
Pt says she thinks the Mirapex works better and would like to continue on this at 3 pills per day. She will call in 1 month with an update on how this is working for her or call before then if having problems. New RX sent to [facility] on [address]. Pt would like to know if she should still continue taking the Clonazepam at night since Mirapex is going to be 2 at night. KC, pls advise.

## 2012-05-27 ENCOUNTER — Other Ambulatory Visit: Payer: Self-pay | Admitting: Internal Medicine

## 2012-05-31 ENCOUNTER — Telehealth: Payer: Self-pay | Admitting: Pulmonary Disease

## 2012-05-31 NOTE — Telephone Encounter (Signed)
Ok to fill #90 with 6 fills.  Let her know that if she is not satisfied with her control, can consider some other alternatives.

## 2012-05-31 NOTE — Telephone Encounter (Signed)
Spoke with patient, patient states she feels the Mirapex 0.25mg  seems to be working "most of the time" and would like a rx sent to [facility].  Medication last filled 04/26/13 #90 with 0 refills-- 1 tablet in afternoon 2 tablets at dinner Dr. Shelle Iron is this Rx ok to fill? Thank you  Last OV:12/313

## 2012-06-01 MED ORDER — PRAMIPEXOLE DIHYDROCHLORIDE 0.25 MG PO TABS: ORAL_TABLET | ORAL | Status: DC

## 2012-06-01 NOTE — Telephone Encounter (Signed)
Pt aware rx has been sent. Nothing further was needed 

## 2012-06-09 ENCOUNTER — Encounter: Payer: Self-pay | Admitting: Pulmonary Disease

## 2012-06-09 ENCOUNTER — Ambulatory Visit (INDEPENDENT_AMBULATORY_CARE_PROVIDER_SITE_OTHER): Payer: Medicare Other | Admitting: Pulmonary Disease

## 2012-06-09 VITALS — BP 120/84 | HR 82 | Temp 98.4°F | Ht 66.5 in | Wt 192.0 lb

## 2012-06-09 DIAGNOSIS — G2581 Restless legs syndrome: Secondary | ICD-10-CM

## 2012-06-09 MED ORDER — HYDROCODONE-ACETAMINOPHEN 5-325 MG PO TABS: 1.0000 | ORAL_TABLET | Freq: Every evening | ORAL | Status: DC | PRN

## 2012-06-09 NOTE — Addendum Note (Signed)
Addended by: Nita Sells on: 06/09/2012 11:55 AM   Modules accepted: Orders

## 2012-06-09 NOTE — Patient Instructions (Addendum)
Try increasing mirapex 0.25mg  to 2 in the afternoon and 2 after dinner.  If you continue to have breakthru symptoms you could try adding back 0.5mg  of klonopine, but the other alternative would be to discontinue afternoon dose of mirapex and start horizant 600mg  at 5pm each day.  Stay on evening dose of mirapex for now. Will give you a prescription for vicodan 5mg  to take with you on your trip.  Use at night if you are having a difficult time with your legs. Do not take klonopine if you take vicodin at night Please call me if any questions or problems while on vacation.  Leave a cell number where I can reach you. Call and give me some feedback with how things are going when you get back in town.

## 2012-06-09 NOTE — Assessment & Plan Note (Signed)
The patient is having breakthrough symptoms despite being changed to Mirapex.  I would like to try and increase her dose a bit to see if this helps, but if it does not, we will give her a trial of extended release Neurontin.  She is getting ready to go on vacation, and I will give her a prescription for hydrocodone which helped significantly when she has breakthrough.  She is to only use this if she is having great difficulty while out of town.  He will call and kidneys the back when she gets back to [place].

## 2012-06-09 NOTE — Progress Notes (Signed)
  Subjective:    Patient ID: Erin Roach, female    DOB: June 14, 1940, 72 y.o.   MRN: [id number]  HPI Patient comes in today for an acute sick visit.  She has severe restless leg syndrome, and at the last visit she was changed from Requip to Mirapex.  She initially had a good response to this, but the last week or so she has noticed worsening symptoms, especially at night.  She has gotten to the point now that she has poor control of her symptoms and it is greatly disrupting her sleep.   Review of Systems  Constitutional: Negative for fever and unexpected weight change.  HENT: Positive for sneezing. Negative for ear pain, nosebleeds, congestion, sore throat, rhinorrhea, trouble swallowing, dental problem, postnasal drip and sinus pressure.   Eyes: Negative for redness and itching.  Respiratory: Positive for cough. Negative for chest tightness, shortness of breath and wheezing.   Cardiovascular: Positive for leg swelling. Negative for palpitations.  Gastrointestinal: Positive for nausea ( d/t Mirapex). Negative for vomiting.  Genitourinary: Negative for dysuria.  Musculoskeletal: Negative for joint swelling.  Skin: Negative for rash.  Neurological: Negative for headaches.  Hematological: Does not bruise/bleed easily.  Psychiatric/Behavioral: Positive for sleep disturbance ( decrease in sleep d/t RLS). Negative for dysphoric mood. The patient is not nervous/anxious.        Objective:   Physical Exam Well-developed female in no acute distress Nose without purulence or discharge noted Neck without lymphadenopathy or thyromegaly Lower extremities without edema, no cyanosis Alert and oriented, moves all 4 extremities.       Assessment & Plan:

## 2012-06-27 ENCOUNTER — Telehealth: Payer: Self-pay | Admitting: Internal Medicine

## 2012-06-27 NOTE — Telephone Encounter (Signed)
Last OV 12-11-10

## 2012-06-27 NOTE — Telephone Encounter (Signed)
OK X1 but additional refills require office visit to update medical history.Last OV 8/12

## 2012-06-27 NOTE — Telephone Encounter (Signed)
refill  Spironolactone (Tab) 25 MG Take 25 mg by mouth 2 (two) times daily #180, last fill 11.4.13

## 2012-06-28 MED ORDER — SPIRONOLACTONE 25 MG PO TABS: ORAL_TABLET | ORAL | Status: DC

## 2012-07-05 ENCOUNTER — Other Ambulatory Visit: Payer: Self-pay | Admitting: Internal Medicine

## 2012-07-05 NOTE — Telephone Encounter (Signed)
Patient needs to schedule medication management appointment

## 2012-08-24 ENCOUNTER — Telehealth: Payer: Self-pay | Admitting: Pulmonary Disease

## 2012-08-24 NOTE — Telephone Encounter (Signed)
I spoke with pt. She has been taking mirapex 0.25 mg 2 in the afternoon and 2 after dinner. For the past 2 night she has been having problems with her restless legs. She is wanting to know if she needs to increase her dose. Please advise KC thanks

## 2012-08-25 MED ORDER — ROTIGOTINE 1 MG/24HR TD PT24: 1.0000 | MEDICATED_PATCH | Freq: Every day | TRANSDERMAL | Status: DC

## 2012-08-25 NOTE — Telephone Encounter (Signed)
Pt returned call Advised of KC's recs as stated below Pt verbalized her understanding and denied any questions KC had dropped off 2 samples in triage Samples documented per protocol Nothing further needed; will sign off

## 2012-08-25 NOTE — Telephone Encounter (Signed)
Would like her to start as a trial neupro patches, one locally each day.  Start now, and change same time each day. Pt information sheet will be provided. If she has breakthru, would like her to use either her klonopine or vicodin until her level builds up.  If it helps but not completely we can increase dose after one week ( we have samples for this as well).   Want her to stop mirapex FOR NOW until we can see how she does.  Ok to take tonights dose with patch to let it start working.

## 2012-08-25 NOTE — Telephone Encounter (Signed)
Called and spoke with pts husband and he will have the pt call us back.

## 2012-08-30 ENCOUNTER — Telehealth: Payer: Self-pay | Admitting: Pulmonary Disease

## 2012-08-30 NOTE — Telephone Encounter (Signed)
Patient aware that samples for Horizant 600mg  are up front to pick up. Instructions are to be given to patient upon arrival.

## 2012-08-30 NOTE — Telephone Encounter (Signed)
Pt states that she was recently switched to Neupro patch and has experienced hives with the patch. Pt wanted to let [facility] know that she has been unable to sleep the last two d/t her RLS. Pt has d/c the patch--still taking Mirapex 1mg  daily (.50mg  at lunch and .50mg  at dinner) > pt is requesting that this be increased if able.   Please advise Dr Shelle Iron. Thanks.

## 2012-08-30 NOTE — Telephone Encounter (Signed)
Have her stop patch Start horizant 600mg  one at 5pm each day Decrease her mirapex by 0.25mg  (one pill) every 3 days, as long as her symptoms are doing ok. May have to use a vicodan at bedtime for severe symptoms.  Give me a call in 2 weeks with response.

## 2012-08-31 ENCOUNTER — Telehealth: Payer: Self-pay | Admitting: Pulmonary Disease

## 2012-08-31 NOTE — Telephone Encounter (Signed)
Erin Roach, please let pt know that I read her letter, and I am ok with being patient with this.  I agree with letting severity of symptoms drive the level of aggressiveness of the treatment, but I also do not want her to have decreased quality of life that is significant.  I would like her to keep a diary for the next few weeks of when her legs bother her during the day and night, and record the time and level of pain (0 to 10, with 0 being no discomfort and 10 being the worst it has ever been).  If the symptoms are bothering her a lot during the day, sometimes a delayed release formulation may work better for her.  Have her drop the diary off once done for a few weeks.

## 2012-09-01 NOTE — Telephone Encounter (Signed)
LMOM x 1 

## 2012-09-06 ENCOUNTER — Ambulatory Visit (INDEPENDENT_AMBULATORY_CARE_PROVIDER_SITE_OTHER): Payer: Medicare Other | Admitting: Internal Medicine

## 2012-09-06 ENCOUNTER — Encounter: Payer: Self-pay | Admitting: Internal Medicine

## 2012-09-06 VITALS — BP 134/86 | HR 97 | Resp 12 | Wt 183.0 lb

## 2012-09-06 DIAGNOSIS — E785 Hyperlipidemia, unspecified: Secondary | ICD-10-CM

## 2012-09-06 DIAGNOSIS — R609 Edema, unspecified: Secondary | ICD-10-CM

## 2012-09-06 DIAGNOSIS — R030 Elevated blood-pressure reading, without diagnosis of hypertension: Secondary | ICD-10-CM

## 2012-09-06 DIAGNOSIS — R32 Unspecified urinary incontinence: Secondary | ICD-10-CM

## 2012-09-06 DIAGNOSIS — I872 Venous insufficiency (chronic) (peripheral): Secondary | ICD-10-CM

## 2012-09-06 LAB — LIPID PANEL
Cholesterol: 211 mg/dL — ABNORMAL HIGH (ref 0–200)
Total CHOL/HDL Ratio: 3
VLDL: 17.6 mg/dL (ref 0.0–40.0)

## 2012-09-06 LAB — BASIC METABOLIC PANEL
Chloride: 103 mEq/L (ref 96–112)
Creatinine, Ser: 0.7 mg/dL (ref 0.4–1.2)
Potassium: 4 mEq/L (ref 3.5–5.1)
Sodium: 137 mEq/L (ref 135–145)

## 2012-09-06 LAB — TSH: TSH: 2.88 u[IU]/mL (ref 0.35–5.50)

## 2012-09-06 LAB — HEPATIC FUNCTION PANEL
AST: 22 U/L (ref 0–37)
Bilirubin, Direct: 0.1 mg/dL (ref 0.0–0.3)
Total Bilirubin: 0.8 mg/dL (ref 0.3–1.2)

## 2012-09-06 LAB — LDL CHOLESTEROL, DIRECT: Direct LDL: 141.3 mg/dL

## 2012-09-06 MED ORDER — SPIRONOLACTONE 25 MG PO TABS: ORAL_TABLET | ORAL | Status: DC

## 2012-09-06 MED ORDER — MIRABEGRON ER 25 MG PO TB24: 25.0000 mg | ORAL_TABLET | Freq: Every day | ORAL | Status: DC

## 2012-09-06 NOTE — Assessment & Plan Note (Signed)
Trial of Myrbetriq 25 mg in place of Vesicare

## 2012-09-06 NOTE — Assessment & Plan Note (Signed)
Blood pressure is high normal; blood pressure goals delineated.

## 2012-09-06 NOTE — Patient Instructions (Addendum)
Minimal Blood Pressure Goal= AVERAGE < 140/90;  Ideal is an AVERAGE < 135/85. This AVERAGE should be calculated from @ least 5-7 BP readings taken @ different times of day on different days of week. You should not respond to isolated BP readings , but rather the AVERAGE for that week .Please bring your  blood pressure cuff to office visits to verify that it is reliable.It  can also be checked against the blood pressure device at the pharmacy. Finger or wrist cuffs are not dependable; an arm cuff is.  If you activate the  My Chart system; lab & Xray results will be released directly  to you as soon as I review & address these through the computer. If you choose not to sign up for My Chart within 36 hours of labs being drawn; results will be reviewed & interpretation added before being copied & mailed, causing a delay in getting the results to you.If you do not receive that report within 7-10 days ,please call. Additionally you can use this system to gain direct  access to your records  if  out of town or @ an office of a  physician who is not in  the My Chart network.  This improves continuity of care & places you in control of your medical record.  Please review the record and make any corrections; share this with all medical staff seen. If you note change or progression in symptoms please contact us through My Chart ASAP. This will allow [name] to respond as quickly as possible and schedule appropriate studies (blood test or imaging).

## 2012-09-06 NOTE — Assessment & Plan Note (Addendum)
  Consider changing Simcor

## 2012-09-06 NOTE — Progress Notes (Signed)
  Subjective:    Patient ID: Erin Roach, female    DOB: January 11, 1941, 72 y.o.   MRN: [id number]  HPI The patient is here for followup of  hyperlipidemia , elevated BP, edema, & incontinence reasssessment.  Diet is heart healthy, the "[employer]". Exercise >5 X/ week as walking for 30  minutes.  The most recent lipids  Were in 06/2011  ; NMR revealed LDL goal = < 110. There is medical compliance with the statin.Rare itching & some flushing.  Blood pressure not monitored  . There is medical  with Spironolactone        Review of Systems Constitutional: Significant weight change as 10# loss;  some fatigue Eyes: No  blurred vision;  double vision ; loss of vision Cardiovascular: no chest pain ;palpitations; racing; irregular rhythm ; claudication ; some edema  Respiratory: No exertional dyspnea;  paroxysmal nocturnal dyspnea Genitourinary: No dysuria; hematuria ; pyuria; frequency during day. Incontinence persistent;  Nocturia X4 Musculoskeletal: No myalgias or muscle cramping  Dermatologic: No non healing  Lesions; change in color or temperature of skin Neurologic: No  limb weakness;  numbness or tingling;  burning Endocrine: No change in hair, skin, nails. No excessive thirst; excessive hunger;  excessive urination      Objective:   Physical Exam Gen.: Healthy and well-nourished in appearance. Alert, appropriate and cooperative throughout exam.Appears younger than stated age  Eyes: No corneal or conjunctival inflammation noted.  Extraocular motion intact. Vision grossly normal with lenses Nose: External nasal exam reveals no deformity or inflammation. Nasal mucosa are pink and moist. No lesions or exudates noted.  Mouth: Oral mucosa and oropharynx reveal no lesions or exudates. Teeth in good repair. Neck: No deformities, masses, or tenderness noted. Range of motion & Thyroid normal. Lungs: Normal respiratory effort; chest expands symmetrically. Lungs are clear to auscultation  without rales, wheezes, or increased work of breathing. Heart: Normal rate and rhythm. Normal S1 and S2. No gallop, click, or rub. Grade 1/2 over 6 systolic murmur . Abdomen: Bowel sounds normal; abdomen soft and nontender. No masses, organomegaly or hernias noted. Genitalia: As per Gyn                                  Musculoskeletal/extremities: Slight accentuated curvature of upper thoracic  Spine. No clubbing, cyanosis, edema, or significant extremity  deformity noted. Range of motion normal .Tone & strength  Normal. Joints normal. Nail health good. Able to lie down & sit up w/o help. Negative SLR bilaterally Vascular: Carotid, radial artery, dorsalis pedis and  posterior tibial pulses are full and equal. Very faint L carotid present. Neurologic: Alert and oriented x3. Deep tendon reflexes symmetrical & 1/2+.        Skin: Intact without suspicious lesions or rashes. Lymph: No cervical, axillary lymphadenopathy present. Psych: Mood and affect are normal. Normally interactive                                                                                        Assessment & Plan:

## 2012-09-06 NOTE — Assessment & Plan Note (Signed)
Support nose does control the edema; spironolactone is ancillary.

## 2012-09-07 ENCOUNTER — Encounter: Payer: Self-pay | Admitting: Internal Medicine

## 2012-09-08 NOTE — Telephone Encounter (Signed)
Patient aware of recs per [facility].  Pt to work on diary and call if any questions.  Pt to drop off diary in next 2-3 weeks. Nothing further needed.

## 2012-09-10 ENCOUNTER — Encounter: Payer: Self-pay | Admitting: Internal Medicine

## 2012-09-10 ENCOUNTER — Other Ambulatory Visit: Payer: Self-pay | Admitting: Internal Medicine

## 2012-09-10 DIAGNOSIS — R7309 Other abnormal glucose: Secondary | ICD-10-CM

## 2012-09-10 DIAGNOSIS — E785 Hyperlipidemia, unspecified: Secondary | ICD-10-CM

## 2012-09-10 DIAGNOSIS — R0989 Other specified symptoms and signs involving the circulatory and respiratory systems: Secondary | ICD-10-CM

## 2012-09-12 ENCOUNTER — Telehealth: Payer: Self-pay | Admitting: General Practice

## 2012-09-12 ENCOUNTER — Other Ambulatory Visit: Payer: Self-pay | Admitting: Internal Medicine

## 2012-09-12 DIAGNOSIS — E785 Hyperlipidemia, unspecified: Secondary | ICD-10-CM

## 2012-09-12 DIAGNOSIS — R7309 Other abnormal glucose: Secondary | ICD-10-CM

## 2012-09-12 DIAGNOSIS — R0989 Other specified symptoms and signs involving the circulatory and respiratory systems: Secondary | ICD-10-CM

## 2012-09-12 DIAGNOSIS — T887XXA Unspecified adverse effect of drug or medicament, initial encounter: Secondary | ICD-10-CM

## 2012-09-12 MED ORDER — ATORVASTATIN CALCIUM 20 MG PO TABS: 20.0000 mg | ORAL_TABLET | Freq: Every day | ORAL | Status: DC

## 2012-09-12 NOTE — Telephone Encounter (Signed)
   I recommend discontinuing Simcor and substituting atorvastatin 20 mg daily. Dispense 90. After 10 weeks please have fasting labs done: Lipids, hepatic panel, CK,. Codes: 272.4, 995.20.

## 2012-09-12 NOTE — Addendum Note (Signed)
Addended by: Jackson Latino on: 09/12/2012 02:19 PM   Modules accepted: Orders

## 2012-09-12 NOTE — Telephone Encounter (Signed)
Orders placed.

## 2012-09-12 NOTE — Telephone Encounter (Signed)
Called pt, no answer. Orders for future labs placed and med updated in pt chart.

## 2012-09-12 NOTE — Telephone Encounter (Signed)
Pt states that she is due for a refill of her Simcor. At OV on 09/06/12 you had advised pt to consider changing this medication. Pt willing to try something else. Please advise.

## 2012-09-13 NOTE — Telephone Encounter (Signed)
Pt notified of the medication switch and the need to contact the office to schedule labs after 10 weeks on new med.

## 2012-09-14 ENCOUNTER — Encounter (INDEPENDENT_AMBULATORY_CARE_PROVIDER_SITE_OTHER): Payer: Medicare Other

## 2012-09-14 DIAGNOSIS — R7309 Other abnormal glucose: Secondary | ICD-10-CM

## 2012-09-14 DIAGNOSIS — E785 Hyperlipidemia, unspecified: Secondary | ICD-10-CM

## 2012-09-14 DIAGNOSIS — I6529 Occlusion and stenosis of unspecified carotid artery: Secondary | ICD-10-CM

## 2012-09-14 DIAGNOSIS — R0989 Other specified symptoms and signs involving the circulatory and respiratory systems: Secondary | ICD-10-CM

## 2012-09-15 ENCOUNTER — Other Ambulatory Visit: Payer: Self-pay | Admitting: Internal Medicine

## 2012-09-15 NOTE — Telephone Encounter (Signed)
Med filled.  

## 2012-09-27 ENCOUNTER — Ambulatory Visit (INDEPENDENT_AMBULATORY_CARE_PROVIDER_SITE_OTHER): Payer: Medicare Other | Admitting: Internal Medicine

## 2012-09-27 ENCOUNTER — Encounter: Payer: Self-pay | Admitting: Internal Medicine

## 2012-09-27 VITALS — BP 124/80 | HR 88 | Temp 97.9°F | Wt 187.0 lb

## 2012-09-27 DIAGNOSIS — N390 Urinary tract infection, site not specified: Secondary | ICD-10-CM

## 2012-09-27 DIAGNOSIS — R350 Frequency of micturition: Secondary | ICD-10-CM

## 2012-09-27 DIAGNOSIS — R300 Dysuria: Secondary | ICD-10-CM

## 2012-09-27 LAB — POCT URINALYSIS DIPSTICK
Ketones, UA: NEGATIVE
Protein, UA: NEGATIVE
Spec Grav, UA: 1.01
Urobilinogen, UA: 0.2
pH, UA: 6

## 2012-09-27 MED ORDER — NITROFURANTOIN MONOHYD MACRO 100 MG PO CAPS: 100.0000 mg | ORAL_CAPSULE | Freq: Two times a day (BID) | ORAL | Status: DC

## 2012-09-27 NOTE — Progress Notes (Signed)
  Subjective:    Patient ID: Erin Roach, female    DOB: May 29, 1940, 72 y.o.   MRN: [id number]  HPI  Symptoms began 09/24/12 his frequency followed shortly by dysuria. She had a significant improvement with Azo Gantrisin symptoms  She does have a past history of urinary tract infections. Several of the urinary tract infections have been treated while she is out of town. She has no genitourinary predisposition to such. 10 years ago she did have a cystoscopy which was apparently negative. She stated that the urologist treated her apparently resistant urinary tract infections with Levaquin.     Review of Systems  She denies fever, chills, or sweats. She has not had flank pain, hematuria, or pyuria.     Objective:   Physical Exam General appearance is one of good health and nourishment w/o distress.  Eyes: No conjunctival inflammation or scleral icterus is present.  Oral exam: Tongue moist   Heart:  Normal rate and regular rhythm. S1 and S2 normal without gallop, murmur, click, rub or other extra sounds     Lungs:Chest clear to auscultation; no wheezes, rhonchi,rales ,or rubs present.No increased work of breathing.   Abdomen: bowel sounds normal, soft and non-tender without masses, organomegaly or hernias noted.  No guarding or rebound   Skin:Warm & dry.  Intact without suspicious lesions or rashes  Lymphatic: No lymphadenopathy is noted about the head, neck, axilla   No flank tenderness to percussion  Negative straight leg raising            Assessment & Plan:  #1 frequency and dysuria, probable urinary tract infection  Plan: See orders  & recommendations

## 2012-09-27 NOTE — Patient Instructions (Addendum)
Drink as much nondairy fluids as possible. Avoid spicy foods or alcohol as  these may aggravate the bladder. Do not take decongestants. Avoid narcotics if possible. 

## 2012-09-28 ENCOUNTER — Encounter: Payer: Self-pay | Admitting: Pulmonary Disease

## 2012-09-28 ENCOUNTER — Ambulatory Visit (INDEPENDENT_AMBULATORY_CARE_PROVIDER_SITE_OTHER): Payer: Medicare Other | Admitting: Pulmonary Disease

## 2012-09-28 VITALS — BP 118/64 | HR 81 | Temp 98.3°F | Ht 65.25 in | Wt 186.8 lb

## 2012-09-28 DIAGNOSIS — G2581 Restless legs syndrome: Secondary | ICD-10-CM

## 2012-09-28 MED ORDER — HYDROCODONE-ACETAMINOPHEN 5-325 MG PO TABS: 1.0000 | ORAL_TABLET | Freq: Every day | ORAL | Status: DC

## 2012-09-28 NOTE — Progress Notes (Signed)
  Subjective:    Patient ID: Erin Roach, female    DOB: [birth year], 72 y.o.   MRN: [id number]  HPI The patient comes in today for followup of her restless leg syndrome.  She has been having a lot of breakthrough events, and the question has been raised about augmentation.  She has been tried on neupro patch, as well as gabapentin.  She does not think these work as well as Mirapex or Requip.  She currently is doing very poorly on gabapentin alone, and would like to look at other therapies.   Review of Systems  Constitutional: Positive for fatigue. Negative for fever and unexpected weight change.  HENT: Negative for ear pain, nosebleeds, congestion, sore throat, rhinorrhea, sneezing, trouble swallowing, dental problem, postnasal drip and sinus pressure.   Eyes: Negative for redness and itching.  Respiratory: Negative for cough, chest tightness, shortness of breath and wheezing.   Cardiovascular: Negative for palpitations and leg swelling.  Gastrointestinal: Negative for nausea and vomiting.  Genitourinary: Negative for dysuria.  Musculoskeletal: Negative for joint swelling.  Skin: Negative for rash.  Neurological: Negative for headaches.  Hematological: Does not bruise/bleed easily.  Psychiatric/Behavioral: Positive for sleep disturbance ( RLS--worsening). Negative for dysphoric mood. The patient is not nervous/anxious.        Objective:   Physical Exam Overweight female in no acute distress Nose without purulence or discharge noted Neck without lymphadenopathy or thyromegaly Lower extremities without edema, cyanosis Awake, but does appear somewhat sleepy, moves all 4 extremities.       Assessment & Plan:

## 2012-09-28 NOTE — Patient Instructions (Addendum)
Stop horizant Take mirapex 0.25mg , 2 either late afternoon or after dinner each day. Take hydrocodone 5mg  each night at bedtime. Please keep diary about discomfort, and give me an update in 2-3 weeks.

## 2012-09-28 NOTE — Assessment & Plan Note (Signed)
The patient has continued to have significant restless leg symptoms that has not responded to various medications that we have tried.  Most of her symptoms do sound like classic RLS, but some of her symptoms are a little atypical.  We have excluded other causes of RLS, and I have explained to her that we typically consider combination therapy for refractory cases.  Taking everything into account, it appears that she did the best with Mirapex prior to escalation of her symptoms.  I will therefore put her back on this medication and at a narcotic just at bedtime.  The patient is concerned about the medication being habit forming, but I explained that she is on a low dose and only taking the medication at night.  If she continues to have issues despite this, I will consider sending her to neurology to make sure that we are not missing an atypical presentation of another process.

## 2012-09-29 LAB — URINE CULTURE

## 2012-10-17 ENCOUNTER — Other Ambulatory Visit: Payer: Self-pay | Admitting: *Deleted

## 2012-10-17 NOTE — Telephone Encounter (Signed)
Refill request from Erin Roach for Simcor, done for #30 with 6 refills.

## 2012-10-24 ENCOUNTER — Encounter: Payer: Self-pay | Admitting: Internal Medicine

## 2012-10-25 ENCOUNTER — Encounter: Payer: Self-pay | Admitting: Internal Medicine

## 2012-10-25 MED ORDER — MIRABEGRON ER 25 MG PO TB24: 50.0000 mg | ORAL_TABLET | Freq: Every day | ORAL | Status: DC

## 2012-10-25 NOTE — Telephone Encounter (Signed)
New RX sent

## 2012-10-26 ENCOUNTER — Telehealth: Payer: Self-pay | Admitting: Pulmonary Disease

## 2012-10-26 ENCOUNTER — Other Ambulatory Visit: Payer: Self-pay

## 2012-10-26 MED ORDER — HYDROCODONE-ACETAMINOPHEN 5-325 MG PO TABS: 1.0000 | ORAL_TABLET | Freq: Every day | ORAL | Status: DC

## 2012-10-26 MED ORDER — NIACIN-SIMVASTATIN ER 500-20 MG PO TB24: 1.0000 | ORAL_TABLET | Freq: Every day | ORAL | Status: DC

## 2012-10-26 NOTE — Telephone Encounter (Signed)
Spoke with patient, request received for Simcor 500-20 1 by mouth daily, rx is not currently on medication list. RX is noted as being rx'ed in the past. Patient states she is taking Sim-cor and has for years, medication should be on list. RX to be added and sent back to [facility] @ [place]

## 2012-10-26 NOTE — Telephone Encounter (Signed)
Pt aware that rx being phoned-in to pharm--[name]  Vicodin 5-325  Take 1 tablet by mouth at bedtime   #30  x zero refills  Pt states that regimen KC has her on at night works very well!

## 2012-10-26 NOTE — Telephone Encounter (Signed)
Ok x one only  

## 2012-10-26 NOTE — Telephone Encounter (Signed)
I spoke with pt. She is requesting refill on vicodin 5-325. Last refilled 09/28/12 #30 x 0 refills. Pt stated she uses this at night for the discomfort for her RL. Since [facility] is off she asked that I ask the doc on call bc she can't go without. Please advise Dr. Sherene Sires  thanks  Last OV 09/28/12 No pending appt

## 2012-11-23 ENCOUNTER — Encounter: Payer: Self-pay | Admitting: Pulmonary Disease

## 2012-11-23 ENCOUNTER — Encounter: Payer: Self-pay | Admitting: Internal Medicine

## 2012-11-23 ENCOUNTER — Other Ambulatory Visit: Payer: Self-pay | Admitting: Internal Medicine

## 2012-11-23 ENCOUNTER — Telehealth: Payer: Self-pay | Admitting: Pulmonary Disease

## 2012-11-23 MED ORDER — HYDROCODONE-ACETAMINOPHEN 5-325 MG PO TABS: 1.0000 | ORAL_TABLET | Freq: Every day | ORAL | Status: DC

## 2012-11-23 NOTE — Telephone Encounter (Signed)
Pt aware RX called in. Only allowed to call in 5 refills d/t controlled substance. Nothing further was needed

## 2012-11-23 NOTE — Telephone Encounter (Signed)
Last OV 09/28/12 with instruction to take hydrocodone 5mg  at bedtime. Pt last filled on 10/26/12 for #30. Please advise if ok to refill. Carron Curie, CMA

## 2012-11-23 NOTE — Telephone Encounter (Signed)
Ok to give one month with 6 fills.  Let us know if she is not satisfied with her leg symptoms.

## 2012-11-23 NOTE — Telephone Encounter (Signed)
Norco was last refilled 10/26/12 #30 x 0 refills. Please advise KC if okay to refill thanks

## 2012-12-04 ENCOUNTER — Encounter: Payer: Self-pay | Admitting: Internal Medicine

## 2012-12-04 DIAGNOSIS — M8580 Other specified disorders of bone density and structure, unspecified site: Secondary | ICD-10-CM | POA: Insufficient documentation

## 2012-12-15 ENCOUNTER — Other Ambulatory Visit: Payer: Self-pay | Admitting: Gastroenterology

## 2012-12-15 DIAGNOSIS — R1013 Epigastric pain: Secondary | ICD-10-CM

## 2012-12-16 ENCOUNTER — Encounter: Payer: Self-pay | Admitting: Internal Medicine

## 2012-12-16 ENCOUNTER — Other Ambulatory Visit: Payer: Self-pay | Admitting: Internal Medicine

## 2012-12-16 ENCOUNTER — Other Ambulatory Visit: Payer: Medicare Other

## 2012-12-16 DIAGNOSIS — N390 Urinary tract infection, site not specified: Secondary | ICD-10-CM

## 2012-12-16 MED ORDER — NITROFURANTOIN MONOHYD MACRO 100 MG PO CAPS: 100.0000 mg | ORAL_CAPSULE | Freq: Two times a day (BID) | ORAL | Status: DC

## 2012-12-18 LAB — URINE CULTURE

## 2012-12-21 ENCOUNTER — Encounter: Payer: Self-pay | Admitting: Internal Medicine

## 2012-12-26 ENCOUNTER — Other Ambulatory Visit (HOSPITAL_COMMUNITY): Payer: Medicare Other

## 2012-12-28 ENCOUNTER — Ambulatory Visit (HOSPITAL_COMMUNITY): Payer: Medicare Other

## 2012-12-28 ENCOUNTER — Encounter (HOSPITAL_COMMUNITY): Payer: Medicare Other

## 2013-01-23 ENCOUNTER — Encounter: Payer: Self-pay | Admitting: Internal Medicine

## 2013-01-23 ENCOUNTER — Telehealth: Payer: Self-pay | Admitting: *Deleted

## 2013-01-23 MED ORDER — MIRABEGRON ER 25 MG PO TB24: 25.0000 mg | ORAL_TABLET | Freq: Every day | ORAL | Status: DC

## 2013-01-23 NOTE — Telephone Encounter (Signed)
Rx has been refill per Dr. Alfonse Flavors.    Ag cma

## 2013-01-23 NOTE — Telephone Encounter (Signed)
Rx has been requested for myrbetriq 25 mg.  As per office protocol medication can be refilled however in patients there is a note stating that medication needs authorization before refill can be done.    Please Advise.  AG cma

## 2013-01-23 NOTE — Telephone Encounter (Signed)
Which resulted in dramatic improvement in her urinary incontinence. 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 He

## 2013-01-24 ENCOUNTER — Telehealth: Payer: Self-pay | Admitting: Pulmonary Disease

## 2013-01-24 NOTE — Telephone Encounter (Signed)
TP wont be able to help her with this problem.  Will send to [facility] to see if we can work her in somewhere.  ?430 slot somewhere?

## 2013-01-24 NOTE — Telephone Encounter (Signed)
KC - you do not have any appointments until 02/13/13. Would you be okay with scheduling her with TP? Please advise.

## 2013-01-26 NOTE — Telephone Encounter (Signed)
Pt scheduled for TUES 9/23 430

## 2013-01-31 ENCOUNTER — Encounter: Payer: Self-pay | Admitting: Pulmonary Disease

## 2013-01-31 ENCOUNTER — Ambulatory Visit (INDEPENDENT_AMBULATORY_CARE_PROVIDER_SITE_OTHER): Payer: Medicare Other | Admitting: Pulmonary Disease

## 2013-01-31 VITALS — BP 138/82 | HR 94 | Temp 97.9°F | Ht 67.0 in | Wt 189.9 lb

## 2013-01-31 DIAGNOSIS — G2581 Restless legs syndrome: Secondary | ICD-10-CM

## 2013-01-31 NOTE — Progress Notes (Signed)
  Subjective:    Patient ID: Erin Roach, female    DOB: February 21, 1941, 72 y.o.   MRN: [id number]  HPI Patient comes in today for followup of her restless leg syndrome.  She has been very difficult to manage, even on combination therapy, but currently seems to be doing fairly well on Mirapex and Vicodin at bedtime.  She still has some breakthrough symptoms at times, but it is not often.  She has a lot of other issues which contribute to her sleep disruption as well.  She is concerned about upcoming travel, in various ways that she can keep her leg movements controlled while sitting for long periods of time.   Review of Systems  Constitutional: Negative for fever and unexpected weight change.  HENT: Negative for ear pain, nosebleeds, congestion, sore throat, rhinorrhea, sneezing, trouble swallowing, dental problem, postnasal drip and sinus pressure.   Eyes: Negative for redness and itching.  Respiratory: Negative for cough, chest tightness, shortness of breath and wheezing.   Cardiovascular: Negative for palpitations and leg swelling.  Gastrointestinal: Negative for nausea and vomiting.  Genitourinary: Negative for dysuria.  Musculoskeletal: Negative for joint swelling.  Skin: Negative for rash.  Neurological: Negative for headaches.  Hematological: Does not bruise/bleed easily.  Psychiatric/Behavioral: Negative for dysphoric mood. The patient is not nervous/anxious.        Objective:   Physical Exam Well-developed female in no acute distress Nose without purulence or discharge noted Neck without lymphadenopathy or thyromegaly Lower extremities without edema, no cyanosis Alert and oriented, moves all 4 extremities.       Assessment & Plan:

## 2013-01-31 NOTE — Assessment & Plan Note (Signed)
The patient has reasonable control of her restless leg symptoms on her current regimen.  However, she does have breakthrough at times.  This is unlikely secondary to augmentation, and her iron panel in the past has been normal.  I do think that it may be worthwhile for her to see neurology for a second opinion, and will refer her to Dr. Arbutus Leas.

## 2013-01-31 NOTE — Patient Instructions (Addendum)
No change in medications Will refer you to neurology for a second opinion for your leg movements.

## 2013-03-01 ENCOUNTER — Telehealth: Payer: Self-pay | Admitting: Pulmonary Disease

## 2013-03-01 MED ORDER — HYDROCODONE-ACETAMINOPHEN 5-325 MG PO TABS: 1.0000 | ORAL_TABLET | Freq: Every day | ORAL | Status: DC

## 2013-03-01 NOTE — Telephone Encounter (Signed)
done

## 2013-03-01 NOTE — Telephone Encounter (Signed)
Per KC ok to print Rx. Pt picked up Rx.

## 2013-03-01 NOTE — Telephone Encounter (Signed)
Last OV 01/31/13 No pending OV Last fill 11/23/12 #30   KC  - please advise on refill. Thanks.

## 2013-03-10 ENCOUNTER — Ambulatory Visit: Payer: Medicare Other | Admitting: Neurology

## 2013-03-10 ENCOUNTER — Ambulatory Visit: Payer: Self-pay | Admitting: Podiatrist

## 2013-03-16 ENCOUNTER — Other Ambulatory Visit: Payer: Self-pay

## 2013-03-25 ENCOUNTER — Other Ambulatory Visit: Payer: Self-pay | Admitting: Internal Medicine

## 2013-03-27 NOTE — Telephone Encounter (Signed)
mirabegron ER and Niacin-simvastatin refilled per protocol

## 2013-03-28 ENCOUNTER — Ambulatory Visit (INDEPENDENT_AMBULATORY_CARE_PROVIDER_SITE_OTHER): Payer: Medicare Other

## 2013-03-28 ENCOUNTER — Other Ambulatory Visit: Payer: Self-pay | Admitting: Internal Medicine

## 2013-03-28 ENCOUNTER — Telehealth: Payer: Self-pay | Admitting: Pulmonary Disease

## 2013-03-28 VITALS — BP 131/70 | HR 85 | Resp 12 | Ht 66.0 in | Wt 180.0 lb

## 2013-03-28 DIAGNOSIS — L600 Ingrowing nail: Secondary | ICD-10-CM

## 2013-03-28 DIAGNOSIS — M79609 Pain in unspecified limb: Secondary | ICD-10-CM

## 2013-03-28 DIAGNOSIS — B351 Tinea unguium: Secondary | ICD-10-CM

## 2013-03-28 MED ORDER — TAVABOROLE 5 % EX SOLN: 1.0000 [drp] | Freq: Every day | CUTANEOUS | Status: DC

## 2013-03-28 NOTE — Telephone Encounter (Signed)
Pt called back.She reports she will be out of norco on 03/30/13. KC please advise if okay to give pt RX? thanks

## 2013-03-28 NOTE — Telephone Encounter (Signed)
Du;licate message. See earlier phone note

## 2013-03-28 NOTE — Progress Notes (Signed)
  Subjective:    Patient ID: Erin Roach, female    DOB: Jan 06, 1941, 72 y.o.   MRN: [id number]  HPI Comments: '' RT FOOT BIG TOENAIL IS A LITTLE SORE''. ALSO IF POSSIBLE TO TRIM MY NAILS''. THE BIG TOENAIL IS HURTING FOR 2 MONTHS. TRIED LAMISIL BUT DID NOT WORK.''     Review of Systems  All other systems reviewed and are negative.       Objective:   Physical Exam Neurovascular status is intact with pedal pulses palpable epicritic and proprioceptive sensations intact and symmetric bilateral. Nails 2 through 5 are thickened and brittle and incurvated with dark and discoloration consistent with onychomycosis. They get painful tender symptomatically may also be getting on the end of summer shoes. Did make recommendations for proper shoe size and fitting in the future. At this time thick brittle dystrophic mycotic nails will be debrided and discussed topical antifungal therapies.       Assessment & Plan:  Assessment this time onychomycosis with keratoses and incurvation of nails 2 through 5 bilateral. Painful tender and symptomatic with ambulation and attempted debridement. Plan at this time the nails are debrided also dispensed a prescription for Kerydin topical antifungal with a coupon for discount. Patient will apply topical agent daily to the affected nails for 812 months duration as instructed suggested followup in 3-4 months for continued palliative care in the future as needed mycotic nails debrided x6   Alvan Dame DPM

## 2013-03-28 NOTE — Patient Instructions (Signed)
Onychomycosis/Fungal Toenails  WHAT IS IT? An infection that lies within the keratin of your nail plate that is caused by a fungus.  WHY ME? Fungal infections affect all ages, sexes, races, and creeds.  There may be many factors that predispose you to a fungal infection such as age, coexisting medical conditions such as diabetes, or an autoimmune disease; stress, medications, fatigue, genetics, etc.  Bottom line: fungus thrives in a warm, moist environment and your shoes offer such a location.  IS IT CONTAGIOUS? Theoretically, yes.  You do not want to share shoes, nail clippers or files with someone who has fungal toenails.  Walking around barefoot in the same room or sleeping in the same bed is unlikely to transfer the organism.  It is important to realize, however, that fungus can spread easily from one nail to the next on the same foot.  HOW DO WE TREAT THIS?  There are several ways to treat this condition.  Treatment may depend on many factors such as age, medications, pregnancy, liver and kidney conditions, etc.  It is best to ask your doctor which options are available to you.  1. No treatment.   Unlike many other medical concerns, you can live with this condition.  However for many people this can be a painful condition and may lead to ingrown toenails or a bacterial infection.  It is recommended that you keep the nails cut short to help reduce the amount of fungal nail. 2. Topical treatment.  These range from herbal remedies to prescription strength nail lacquers.  About 40-50% effective, topicals require twice daily application for approximately 9 to 12 months or until an entirely new nail has grown out.  The most effective topicals are medical grade medications available through physicians offices. 3. Oral antifungal medications.  With an 80-90% cure rate, the most common oral medication requires 3 to 4 months of therapy and stays in your system for a year as the new nail grows out.  Oral  antifungal medications do require blood work to make sure it is a safe drug for you.  A liver function panel will be performed prior to starting the medication and after the first month of treatment.  It is important to have the blood work performed to avoid any harmful side effects.  In general, this medication safe but blood work is required. 4. Laser Therapy.  This treatment is performed by applying a specialized laser to the affected nail plate.  This therapy is noninvasive, fast, and non-painful.  It is not covered by insurance and is therefore, out of pocket.  The results have been very good with a 80-95% cure rate.  [facility] is the only practice in the area to offer this therapy. 5. Permanent Nail Avulsion.  Removing the entire nail so that a new nail will not grow back. 6. Jodi Geralds; apply topical kerydin as instructed daily to the affected nails for a 12 months duration. Once cleared continue to apply at least once a month as a preventative.

## 2013-03-28 NOTE — Telephone Encounter (Signed)
Ok to send in 30d supply of norco, with 6 fills.

## 2013-03-28 NOTE — Telephone Encounter (Signed)
lmtcb x1 w/ spouse. Pt was last giving RX 03/01/13. Pt is not due until 04/01/13 and the pharmacy does not allow early refills.

## 2013-03-29 MED ORDER — HYDROCODONE-ACETAMINOPHEN 5-325 MG PO TABS: 1.0000 | ORAL_TABLET | Freq: Every day | ORAL | Status: DC

## 2013-03-29 NOTE — Telephone Encounter (Signed)
Rx is ready for pick up. Pt is aware  

## 2013-03-30 NOTE — Telephone Encounter (Signed)
Simcor refilled per protocol

## 2013-04-20 ENCOUNTER — Encounter: Payer: Self-pay | Admitting: Neurology

## 2013-04-20 ENCOUNTER — Ambulatory Visit (INDEPENDENT_AMBULATORY_CARE_PROVIDER_SITE_OTHER): Payer: Medicare Other | Admitting: Neurology

## 2013-04-20 VITALS — BP 120/70 | HR 68 | Temp 98.2°F | Ht 66.0 in | Wt 192.0 lb

## 2013-04-20 DIAGNOSIS — G609 Hereditary and idiopathic neuropathy, unspecified: Secondary | ICD-10-CM

## 2013-04-20 DIAGNOSIS — G2581 Restless legs syndrome: Secondary | ICD-10-CM

## 2013-04-20 LAB — COMPREHENSIVE METABOLIC PANEL
AST: 21 U/L (ref 0–37)
Albumin: 4.3 g/dL (ref 3.5–5.2)
Alkaline Phosphatase: 65 U/L (ref 39–117)
BUN: 12 mg/dL (ref 6–23)
Calcium: 9.2 mg/dL (ref 8.4–10.5)
Chloride: 104 mEq/L (ref 96–112)
Creatinine, Ser: 0.7 mg/dL (ref 0.4–1.2)
Potassium: 4.2 mEq/L (ref 3.5–5.1)
Total Bilirubin: 0.5 mg/dL (ref 0.3–1.2)

## 2013-04-20 LAB — FOLATE: Folate: >24.8 ng/mL (ref >5.9)

## 2013-04-20 LAB — VITAMIN B12: Vitamin B-12: 420 pg/mL (ref 211–911)

## 2013-04-20 MED ORDER — PRAMIPEXOLE DIHYDROCHLORIDE 0.25 MG PO TABS: ORAL_TABLET | ORAL | Status: DC

## 2013-04-20 NOTE — Progress Notes (Signed)
Erin Roach was seen today in neurologic consultation at the request of Marga Melnick, MD.  The consultation is for the evaluation of RLS.  This is a second opinion.  The patient has seen Dr. Shelle Iron and I reviewed his records.  The patient has had restless leg syndrome about 40 years ago, when she was pregnant. Over the years, it came and went but it has been "bad" for the last 20 + years.  She has been on multiple medications.  She has been on Requip, clonazepam, Neupro (site reaction) and Horizant.  She is now on pramipexole, 0.25 mg, 2 tablets at bedtime.   She tried quinine a long time ago and it worked great but it is no longer available for RLS.  She also uses Vicodin for restless leg syndrome.  She takes one at night.  It helps.  Her grandfather and father had similar.    She reports that she has most of the symptoms at night but sometimes during the day.  She has a burning pain in the legs that can get severe.  She always has to move them.  She gets up and walks during the middle of the night.  Her medications seem to be inconsistent.     PREVIOUS MEDICATIONS: n/a  ALLERGIES:  No Known Allergies  CURRENT MEDICATIONS:  Current Outpatient Prescriptions on File Prior to Visit  Medication Sig Dispense Refill  . aspirin 81 MG tablet Take 81 mg by mouth daily.       . budesonide-formoterol (SYMBICORT) 160-4.5 MCG/ACT inhaler Inhale 2 puffs into the lungs daily.       [name] HYDROcodone-acetaminophen (NORCO/VICODIN) 5-325 MG per tablet Take 1 tablet by mouth at bedtime.  30 tablet  0  . pramipexole (MIRAPEX) 0.25 MG tablet Take 2 tablet in the afternoon and 2 tablets after dinner.      [name] SIMCOR 500-20 MG 24 hr tablet TAKE 1 TABLET BY MOUTH AT BEDTIME.  30 tablet  4  . SIMCOR 500-20 MG 24 hr tablet TAKE 1 TABLET BY MOUTH AT BEDTIME.  30 tablet  4  . spironolactone (ALDACTONE) 25 MG tablet TAKE 1 TABLET BY MOUTH TWICE DAILY  180 tablet  3  . Tavaborole (KERYDIN) 5 % SOLN Apply 1 drop topically  daily.  10 mL  10  . [DISCONTINUED] VESICARE 10 MG tablet TAKE 1 TABLET EVERY DAY  30 tablet  0   No current facility-administered medications on file prior to visit.    PAST MEDICAL HISTORY:   Past Medical History  Diagnosis Date  . Venous insufficiency     legs  . Leg edema     bilateral  . RLS (restless legs syndrome)   . Esophageal reflux   . Urinary incontinence   . Skin cancer     hx of squamous cell buttocks area, Bernerd Pho MD,Gen Surgery: Amy Swaziland MD, derm  . Asthma   . Snoring     no sleep study  . Heart murmur     PAST SURGICAL HISTORY:   Past Surgical History  Procedure Laterality Date  . Appendectomy    . Tonsillectomy    . Skin cancer removal      squamous cell interglureal space  . Ulnar nerve surgery    . Colonoscopy      neg; Dr Loreta Ave  . Cystoscopy  2004    ? negative, Dr Annabell Howells    SOCIAL HISTORY:   History   Social History  . Marital  Status: Married    Spouse Name: N/A    Number of Children: N/A  . Years of Education: N/A   Occupational History  . retired Runner, broadcasting/film/video    Social History Main Topics  . Smoking status: Former Smoker -- 0.50 packs/day for 15 years    Types: Cigarettes  . Smokeless tobacco: Never Used     Comment: smoked age [age], up to 1 ppd  . Alcohol Use: 1.8 oz/week    3 Glasses of wine per week     Comment: socially  . Drug Use: No  . Sexual Activity: Not on file   Other Topics Concern  . Not on file   Social History Narrative   2 children ages 86,35   Regular exercise no    FAMILY HISTORY:   Family Status  Relation Status Death Age  . Father Deceased   . Mother Alive     ROS:  A complete 10 system review of systems was obtained and was unremarkable apart from what is mentioned above.  PHYSICAL EXAMINATION:    VITALS:   Filed Vitals:   04/20/13 1038  BP: 120/70  Pulse: 68  Temp: 98.2 F (36.8 C)  TempSrc: Oral  Height: 5\' 6"  (1.676 m)  Weight: 192 lb (87.091 kg)    GEN:  Normal appears female  in no acute distress.  Appears younger than stated age HEENT:  Normocephalic, atraumatic. The mucous membranes are moist. The superficial temporal arteries are without ropiness or tenderness. Cardiovascular: Regular rate and rhythm. Lungs: Clear to auscultation bilaterally. Neck/Heme: There are no carotid bruits noted bilaterally.  NEUROLOGICAL: Orientation:  The patient is alert and oriented x 3.  Fund of knowledge is appropriate.  Recent and remote memory intact.  Attention span and concentration normal.  Repeats and names without difficulty. Cranial nerves: There is good facial symmetry. The pupils are equal round and reactive to light bilaterally. Fundoscopic exam reveals clear disc margins bilaterally. Extraocular muscles are intact and visual fields are full to confrontational testing. Speech is fluent and clear. Soft palate rises symmetrically and there is no tongue deviation. Hearing is intact to conversational tone. Tone: Tone is good throughout. Sensation: Sensation is intact to light touch and pinprick throughout (facial, trunk, extremities). Vibration is decreased distally. There is no extinction with double simultaneous stimulation. There is no sensory dermatomal level identified. Coordination:  The patient has no difficulty with RAM's or FNF bilaterally. Motor: Strength is 5/5 in the bilateral upper and lower extremities.  Shoulder shrug is equal and symmetric. There is no pronator drift.  There are no fasciculations noted. DTR's: Deep tendon reflexes are 2/4 at the bilateral biceps, triceps, brachioradialis, patella and absent at the bilateral achilles.  Plantar responses are downgoing bilaterally. Gait and Station: The patient is able to ambulate without difficulty. She does have trouble with heel/toe walking.  She is able to stand in the Romberg position.  Lab Results  Component Value Date   VITAMINB12 395 11/06/2009   Lab Results  Component Value Date   HGBA1C 5.5 08/22/2010      Chemistry      Component Value Date/Time   NA 137 09/06/2012 0907   K 4.0 09/06/2012 0907   CL 103 09/06/2012 0907   CO2 26 09/06/2012 0907   BUN 18 09/06/2012 0907   CREATININE 0.7 09/06/2012 0907      Component Value Date/Time   CALCIUM 9.2 09/06/2012 0907   ALKPHOS 61 09/06/2012 0907   AST 22 09/06/2012 0907  ALT 29 09/06/2012 0907   BILITOT 0.8 09/06/2012 0907     Lab Results  Component Value Date   WBC 6.2 02/08/2012   HGB 12.9 02/08/2012   HCT 38.3 02/08/2012   MCV 87.6 02/08/2012   PLT 203 02/08/2012   Lab Results  Component Value Date   TSH 2.88 09/06/2012   Lab Results  Component Value Date   IRON 70 04/12/2012   FERRITIN 37.9 04/12/2012      IMPRESSION/PLAN  1. RLS  -The pt has a long hx and a strong fam hx of RLS  -She will remain on the mirapex but will have her go back up to the dose that Dr. Shelle Iron previously had her on of 0.25 mg - 2 after dinner and 2 at bedtime  -In the future, we may add klonopin back  -In the future, levodopa may be an option.  Topamax may be an option as well.  -I think that she may have a component of peripheral neuropathy confusing the picture.  I will check labs and do an EMG.  -She will continue with the one vicodin she takes at night.

## 2013-04-20 NOTE — Patient Instructions (Addendum)
1.  We will get EMG testing scheduled with Dr. Allena Katz 2.  Increase your mirapex back to 0.25 mg - 2 tablets after dinner and 2 at night 3. Continue your vicodin at bedtime 4.  We will get labs today Follow up in early February

## 2013-04-21 LAB — RPR

## 2013-04-24 ENCOUNTER — Ambulatory Visit: Payer: Medicare Other | Admitting: Neurology

## 2013-04-24 LAB — UIFE/LIGHT CHAINS/TP QN, 24-HR UR
Albumin, U: DETECTED
Alpha 1, Urine: DETECTED — AB
Alpha 2, Urine: DETECTED — AB
Free Kappa Lt Chains,Ur: 1.12 mg/dL (ref 0.14–2.42)
Free Kappa/Lambda Ratio: 6.22 ratio (ref 2.04–10.37)
Free Lambda Lt Chains,Ur: 0.18 mg/dL (ref 0.02–0.67)
Gamma Globulin, Urine: DETECTED — AB
Total Protein, Urine: 1.8 mg/dL

## 2013-04-24 LAB — PROTEIN ELECTROPHORESIS, SERUM
Albumin ELP: 60.7 % (ref 55.8–66.1)
Alpha-1-Globulin: 5.1 % — ABNORMAL HIGH (ref 2.9–4.9)
Alpha-2-Globulin: 11.7 % (ref 7.1–11.8)
Gamma Globulin: 10.4 % — ABNORMAL LOW (ref 11.1–18.8)
Total Protein, Serum Electrophoresis: 7.1 g/dL (ref 6.0–8.3)

## 2013-04-28 ENCOUNTER — Telehealth: Payer: Self-pay | Admitting: Pulmonary Disease

## 2013-04-28 MED ORDER — HYDROCODONE-ACETAMINOPHEN 5-325 MG PO TABS: 1.0000 | ORAL_TABLET | Freq: Every day | ORAL | Status: DC

## 2013-04-28 NOTE — Telephone Encounter (Signed)
Patient calling back about hydrocodone rx.

## 2013-04-28 NOTE — Telephone Encounter (Signed)
I have placed Rx at front for pick up; pt aware.

## 2013-04-28 NOTE — Telephone Encounter (Signed)
Rx was given to patient 03-28-13 with 6 refills; due to new laws we can not give refills so patient needs Rx for this month. I have printed Rx for Victorino Dike to have [facility] sign and place up front for pick up.

## 2013-05-22 ENCOUNTER — Encounter: Payer: Self-pay | Admitting: Neurology

## 2013-05-22 ENCOUNTER — Ambulatory Visit (INDEPENDENT_AMBULATORY_CARE_PROVIDER_SITE_OTHER): Payer: Medicare Other | Admitting: Neurology

## 2013-05-22 DIAGNOSIS — R209 Unspecified disturbances of skin sensation: Secondary | ICD-10-CM

## 2013-05-22 NOTE — Procedures (Signed)
[facility]  [address] [place]  [place], [place] [address] Tel: [phone] Fax:  [phone] Test Date:  05/22/2013  Patient: Erin Roach DOB: 12-20-1940 Physician: Narda Amber, DO  Sex: Female Height: 5\' 6"  Ref Phys: Alonza Bogus  ID#: [id number] Temp: 32.6C Technician:    Patient Complaints: This is a 73 year-old female presenting for evaluation of sensory changes of the legs and balance problems.  NCV & EMG Findings: Extensive evaluation of the left lower extremity and additional studies of the right shows: 1. Normal sural and superficial sensory responses bilaterally. 2. Normal peroneal and tibial motor responses. 3. Bilateral H-reflexes are very mildly prolonged. 4. Needle electrode examination does not show any abnormalities.  There is no evidence of active or chronic motor axon loss in the tested muscles.  Impression: This is a normal study. In particular, there is no evidence of a generalized large fiber peripheral neuropathy affecting the lower extremities or a lumbosacral radiculopathy affecting the left leg.   ___________________________ Narda Amber, DO    Nerve Conduction Studies Anti Sensory Summary Table   Site NR Peak (ms) Norm Peak (ms) P-T Amp (V) Norm P-T Amp  Left Sup Peroneal Anti Sensory (Ant Lat Mall)  12 cm    2.5 <4.6 7.1 >3  Right Sup Peroneal Anti Sensory (Ant Lat Mall)  12 cm    3.1 <4.6 6.9 >3  Left Sural Anti Sensory (Lat Mall)  Calf    3.8 <4.6 5.9 >3  Right Sural Anti Sensory (Lat Mall)  Calf    2.9 <4.6 3.0 >3   Motor Summary Table   Site NR Onset (ms) Norm Onset (ms) O-P Amp (mV) Norm O-P Amp Site1 Site2 Delta-0 (ms) Dist (cm) Vel (m/s) Norm Vel (m/s)  Left Peroneal Motor (Ext Dig Brev)  Ankle    4.5 <6.0 3.9 >2.5 B Fib Ankle 7.2 37.0 51 >40  B Fib    11.7  4.0  Poplt B Fib 1.3 8.0 62 >40  Poplt    13.0  3.8         Right Peroneal Motor (Ext Dig Brev)  Ankle    3.6 <6.0 3.5 >2.5 B Fib Ankle 8.0 36.0 45 >40    B Fib    11.6  3.1  Poplt B Fib 1.4 8.0 57 >40  Poplt    13.0  2.9         Left Tibial Motor (Abd Hall Brev)  Ankle    3.4 <6.0 9.2 >4 Knee Ankle 9.8 40.0 41 >40  Knee    13.2  4.6         Right Tibial Motor (Abd Hall Brev)  Ankle    3.9 <6.0 4.9 >4 Knee Ankle 9.9 40.0 40 >40  Knee    13.8  3.0          H Reflex Studies   NR H-Lat (ms) Lat Norm (ms) L-R H-Lat (ms)  Left Tibial (Gastroc)     36.00 <35 0.89  Right Tibial (Gastroc)     36.89 <35 0.89   EMG   Side Muscle Ins Act Fibs Psw Fasc Number Recrt Dur Dur. Amp Amp. Poly Poly. Comment  Left AntTibialis Nml Nml Nml Nml Nml Nml Nml Nml Nml Nml Nml Nml N/A  Left Gastroc Nml Nml Nml Nml Nml Nml Nml Nml Nml Nml Nml Nml N/A  Left Flex Dig Long Nml Nml Nml Nml Nml Nml Nml Nml Nml Nml Nml Nml N/A  Left  RectFemoris Nml Nml Nml Nml Nml Nml Nml Nml Nml Nml Nml Nml N/A  Left BicepsFemS Nml Nml Nml Nml Nml Nml Nml Nml Nml Nml Nml Nml N/A  Left GluteusMed Nml Nml Nml Nml Nml Nml Nml Nml Nml Nml Nml Nml N/A      Waveforms:

## 2013-05-22 NOTE — Progress Notes (Signed)
See procedure note for EMG results.  Donika K. Patel, DO  

## 2013-05-26 ENCOUNTER — Telehealth: Payer: Self-pay | Admitting: Pulmonary Disease

## 2013-05-26 ENCOUNTER — Other Ambulatory Visit: Payer: Self-pay | Admitting: Internal Medicine

## 2013-05-26 MED ORDER — HYDROCODONE-ACETAMINOPHEN 5-325 MG PO TABS: 1.0000 | ORAL_TABLET | Freq: Every day | ORAL | Status: DC

## 2013-05-26 NOTE — Telephone Encounter (Signed)
According to phone note in November [facility] okay'd for pt to have this RX x 6 refills. Due to new law we can no longer put refills on RX. I have printed this off for [place] to sign. Pt will come by later t pick this up. Nothing further needed

## 2013-05-29 NOTE — Telephone Encounter (Signed)
Myrbetriq refilled per protocol. JG//CMA

## 2013-06-14 ENCOUNTER — Encounter: Payer: Self-pay | Admitting: Neurology

## 2013-06-14 MED ORDER — PREGABALIN 75 MG PO CAPS: 75.0000 mg | ORAL_CAPSULE | Freq: Two times a day (BID) | ORAL | Status: DC

## 2013-06-14 NOTE — Telephone Encounter (Signed)
Patient would like to come by in about an hour to pick up Lyrica 75mg  samples. Please get samples ready for pick up and call patient cell# [phone] / Sherri S.

## 2013-06-14 NOTE — Telephone Encounter (Signed)
Lyrica 75 mg samples documented under meds and orders and at front for patient pick up.

## 2013-06-21 ENCOUNTER — Encounter: Payer: Self-pay | Admitting: Neurology

## 2013-06-21 ENCOUNTER — Ambulatory Visit (INDEPENDENT_AMBULATORY_CARE_PROVIDER_SITE_OTHER): Payer: Medicare Other | Admitting: Neurology

## 2013-06-21 VITALS — BP 130/70 | HR 74 | Ht 64.0 in | Wt 194.0 lb

## 2013-06-21 DIAGNOSIS — G2581 Restless legs syndrome: Secondary | ICD-10-CM

## 2013-06-21 MED ORDER — PREGABALIN 75 MG PO CAPS: 75.0000 mg | ORAL_CAPSULE | Freq: Two times a day (BID) | ORAL | Status: DC

## 2013-06-21 NOTE — Patient Instructions (Signed)
1.  Call me if you need me.  Otherwise I will see you in 6 months!

## 2013-06-21 NOTE — Progress Notes (Signed)
Erin Roach was seen today in neurologic consultation at the request of Unice Cobble, MD.  The consultation is for the evaluation of RLS.  This is a second opinion.  The patient has seen Dr. Gwenette Greet and I reviewed his records.  The patient has had restless leg syndrome about 40 years ago, when she was pregnant. Over the years, it came and went but it has been "bad" for the last 20 + years.  She has been on multiple medications.  She has been on Requip, clonazepam, Neupro (site reaction) and Horizant.  She is now on pramipexole, 0.25 mg, 2 tablets at bedtime.   She tried quinine a long time ago and it worked great but it is no longer available for RLS.  She also uses Vicodin for restless leg syndrome.  She takes one at night.  It helps.  Her grandfather and father had similar.    She reports that she has most of the symptoms at night but sometimes during the day.  She has a burning pain in the legs that can get severe.  She always has to move them.  She gets up and walks during the middle of the night.  Her medications seem to be inconsistent.    06/21/13 update:  The patient presents today for restless leg followup.  She did have an EMG since last visit that did not demonstrate any evidence of a large fiber peripheral neuropathy.  She is on pramipexole 0.25 mg, 2 tablets after dinner and 2 tablets at bedtime.  She called me a few days ago because the symptoms were increasing, and I gave her samples of Lyrica.  She tried Lyrica 75 mg twice a day and got just a little bit groggy, so decided to just take it at night, and thus far it is working well.  She does remain on the Vicodin, one tablet at night.  PREVIOUS MEDICATIONS: n/a  ALLERGIES:  No Known Allergies  CURRENT MEDICATIONS:  Current Outpatient Prescriptions on File Prior to Visit  Medication Sig Dispense Refill  . aspirin 81 MG tablet Take 81 mg by mouth daily.       . budesonide-formoterol (SYMBICORT) 160-4.5 MCG/ACT inhaler Inhale 2  puffs into the lungs daily.       [name] HYDROcodone-acetaminophen (NORCO/VICODIN) 5-325 MG per tablet Take 1 tablet by mouth at bedtime.  30 tablet  0  . MYRBETRIQ 25 MG TB24 tablet TAKE 2 TABLETS (50MG  TOTAL) BY MOUTH DAILY  60 tablet  0  . pramipexole (MIRAPEX) 0.25 MG tablet Take 2 tablet in the afternoon and 2 tablets after dinner.  360 tablet  3  . pregabalin (LYRICA) 75 MG capsule Take 1 capsule (75 mg total) by mouth 2 (two) times daily.  70 capsule  0  . SIMCOR 500-20 MG 24 hr tablet TAKE 1 TABLET BY MOUTH AT BEDTIME.  30 tablet  4  . SIMCOR 500-20 MG 24 hr tablet TAKE 1 TABLET BY MOUTH AT BEDTIME.  30 tablet  4  . spironolactone (ALDACTONE) 25 MG tablet TAKE 1 TABLET BY MOUTH TWICE DAILY  180 tablet  3  . Tavaborole (KERYDIN) 5 % SOLN Apply 1 drop topically daily.  10 mL  10  . [DISCONTINUED] VESICARE 10 MG tablet TAKE 1 TABLET EVERY DAY  30 tablet  0   No current facility-administered medications on file prior to visit.    PAST MEDICAL HISTORY:   Past Medical History  Diagnosis Date  . Venous insufficiency  legs  . Leg edema     bilateral  . RLS (restless legs syndrome)   . Esophageal reflux   . Urinary incontinence   . Skin cancer     hx of squamous cell buttocks area, Yevette Edwards MD,Gen Surgery: Amy Martinique MD, derm  . Asthma   . Snoring     no sleep study  . Heart murmur     PAST SURGICAL HISTORY:   Past Surgical History  Procedure Laterality Date  . Appendectomy    . Tonsillectomy    . Skin cancer removal      squamous cell interglureal space  . Ulnar nerve surgery      04/20/13:  pt denies  . Colonoscopy      neg; Dr Collene Mares  . Cystoscopy  2004    ? negative, Dr Jeffie Pollock    SOCIAL HISTORY:   History   Social History  . Marital Status: Married    Spouse Name: N/A    Number of Children: N/A  . Years of Education: N/A   Occupational History  . retired Pharmacist, hospital    Social History Main Topics  . Smoking status: Former Smoker -- 0.50 packs/day for 15 years      Types: Cigarettes  . Smokeless tobacco: Never Used     Comment: smoked age [age], up to 1 ppd  . Alcohol Use: 1.8 oz/week    3 Glasses of wine per week     Comment: socially  . Drug Use: No  . Sexual Activity: Not on file   Other Topics Concern  . Not on file   Social History Narrative   2 children ages 46,35   Regular exercise no    FAMILY HISTORY:   Family Status  Relation Status Death Age  . Father Deceased 16    rls, esophageal CA  . Mother Alive     healthy  . Brother Alive     healthy  . Sister Alive     healthy  . Child Alive     2, alive and well    ROS:  A complete 10 system review of systems was obtained and was unremarkable apart from what is mentioned above.  PHYSICAL EXAMINATION:    VITALS:   Filed Vitals:   06/21/13 1014  BP: 130/70  Pulse: 74  Height: 5\' 4"  (1.626 m)  Weight: 194 lb (87.998 kg)    GEN:  Normal appears female in no acute distress.  Appears younger than stated age 73:  Normocephalic, atraumatic. The mucous membranes are moist. The superficial temporal arteries are without ropiness or tenderness. Cardiovascular: Regular rate and rhythm. Lungs: Clear to auscultation bilaterally. Neck/Heme: There are no carotid bruits noted bilaterally.  NEUROLOGICAL: Orientation:  The patient is alert and oriented x 3.  Fund of knowledge is appropriate.  Recent and remote memory intact.  Attention span and concentration normal.  Repeats and names without difficulty. Cranial nerves: There is good facial symmetry. The pupils are equal round and reactive to light bilaterally. Fundoscopic exam reveals clear disc margins bilaterally. Extraocular muscles are intact and visual fields are full to confrontational testing. Speech is fluent and clear. Soft palate rises symmetrically and there is no tongue deviation. Hearing is intact to conversational tone. Tone: Tone is good throughout. Sensation: Sensation is intact to light touch and pinprick  throughout (facial, trunk, extremities). Vibration is decreased distally. There is no extinction with double simultaneous stimulation. There is no sensory dermatomal level identified. Coordination:  The patient  has no difficulty with RAM's or FNF bilaterally. Motor: Strength is 5/5 in the bilateral upper and lower extremities.  Shoulder shrug is equal and symmetric. There is no pronator drift.  There are no fasciculations noted.   Lab Results  Component Value Date   PXTGGYIR48 546 04/20/2013   Lab Results  Component Value Date   HGBA1C 5.5 08/22/2010     Chemistry      Component Value Date/Time   NA 138 04/20/2013 1142   K 4.2 04/20/2013 1142   CL 104 04/20/2013 1142   CO2 26 04/20/2013 1142   BUN 12 04/20/2013 1142   CREATININE 0.7 04/20/2013 1142      Component Value Date/Time   CALCIUM 9.2 04/20/2013 1142   ALKPHOS 65 04/20/2013 1142   AST 21 04/20/2013 1142   ALT 26 04/20/2013 1142   BILITOT 0.5 04/20/2013 1142     Lab Results  Component Value Date   WBC 6.2 02/08/2012   HGB 12.9 02/08/2012   HCT 38.3 02/08/2012   MCV 87.6 02/08/2012   PLT 203 02/08/2012   Lab Results  Component Value Date   TSH 2.88 09/06/2012   Lab Results  Component Value Date   IRON 70 04/12/2012   FERRITIN 33.5 04/20/2013      IMPRESSION/PLAN  1. RLS  -The pt has a long hx and a strong fam hx of RLS  -She will remain on the mirapex 0.25 mg - 2 after dinner and 2 at bedtime.  We did talk about the phenomenon of augmentation, specifically associated with these dopamine agonists, but decided to stay on the Mirapex for now.  We may need to try and decrease in the future.  -She seems to be doing well with the Lyrica 75 mg, one or 2 tablets per night and can continue on that.  A prescription was written for that.  -In the future, if we continue to struggle, then we will consider adding Klonopin back again, trying Topamax or even levodopa.  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  She and I discussed this today.  -Followup with me in 6 months, sooner should new neurologic issues arise.

## 2013-06-23 ENCOUNTER — Encounter: Payer: Self-pay | Admitting: Podiatrist

## 2013-06-23 ENCOUNTER — Ambulatory Visit (INDEPENDENT_AMBULATORY_CARE_PROVIDER_SITE_OTHER): Payer: Medicare Other | Admitting: Podiatrist

## 2013-06-23 VITALS — BP 114/74 | HR 89 | Resp 12

## 2013-06-23 DIAGNOSIS — B351 Tinea unguium: Secondary | ICD-10-CM

## 2013-06-23 DIAGNOSIS — M79609 Pain in unspecified limb: Secondary | ICD-10-CM

## 2013-06-23 DIAGNOSIS — L600 Ingrowing nail: Secondary | ICD-10-CM

## 2013-06-23 NOTE — Progress Notes (Signed)
''   RT FOOT TOES ARE STILL HURTING AND THICK. THE ANTIFUNGAL IS NOT WORKING.''   Subjective:  Erin Roach presents for follow up on fungal, incurvated toenails 2 and 4 of the right foot.  She has been usin the [place] as prescribed by Dr. Blenda Mounts for 2 months and has noticed no improvement.  She took Lamisil long ago but states no improvement in the appearance of the toenails.  The nails are painful and she would like to see improvement.  Objective:  Neurovascular status intact and unchanged.  Toenails 2,4 right are incurvated, thickened, mycotic and painful.  Remainder of the toenails are asymptomatic.  Assessment:  Mycotic toenails x 2  Plan:  Recommended continued use of Kerydin as 2 months is not long enough to decide how well it will work for her nails.  Discussed we could consider Onmel if the [place] is not beneficial as she has failed Lamisil in the past.  Would require blood work of hepatic panel and cbc with diff prior to onmel rx.  We debrided the thickened toenails today.

## 2013-06-23 NOTE — Patient Instructions (Signed)
Call if you are interesed in trying a different pill therapy-- we would need to get blood work done prior to starting the medication as well

## 2013-06-28 ENCOUNTER — Telehealth: Payer: Self-pay | Admitting: Pulmonary Disease

## 2013-06-28 MED ORDER — HYDROCODONE-ACETAMINOPHEN 5-325 MG PO TABS: 1.0000 | ORAL_TABLET | Freq: Every day | ORAL | Status: DC

## 2013-06-28 NOTE — Telephone Encounter (Signed)
Signed and placed up front for pick up Pt aware.

## 2013-06-28 NOTE — Telephone Encounter (Signed)
RX printed and giving to asthyn for kc TO SIGN. Will forward to Ashtyn to call once done

## 2013-07-31 ENCOUNTER — Telehealth: Payer: Self-pay | Admitting: Pulmonary Disease

## 2013-07-31 MED ORDER — HYDROCODONE-ACETAMINOPHEN 5-325 MG PO TABS: 1.0000 | ORAL_TABLET | Freq: Every day | ORAL | Status: DC

## 2013-07-31 NOTE — Telephone Encounter (Signed)
Pt has called to check on RX.  She states she HAS to have it today.

## 2013-07-31 NOTE — Telephone Encounter (Signed)
This has been given to [facility] to sign.  [place] aware that patient picking up today.

## 2013-07-31 NOTE — Telephone Encounter (Signed)
Spoke with pt. She is aware RX is awaiting signature.

## 2013-07-31 NOTE — Telephone Encounter (Signed)
RX has been printed for [facility] to sign. Please advise once ready for pick up. thanks

## 2013-07-31 NOTE — Telephone Encounter (Signed)
Spoke with patient in regards to picking up Rx Patient aware that office closes at 530. Pt states she needs this today and will pick up today.  Rx placed up front.

## 2013-08-03 ENCOUNTER — Other Ambulatory Visit: Payer: Self-pay | Admitting: *Deleted

## 2013-08-03 DIAGNOSIS — R609 Edema, unspecified: Secondary | ICD-10-CM

## 2013-08-03 DIAGNOSIS — R030 Elevated blood-pressure reading, without diagnosis of hypertension: Secondary | ICD-10-CM

## 2013-08-03 MED ORDER — SPIRONOLACTONE 25 MG PO TABS: ORAL_TABLET | ORAL | Status: DC

## 2013-08-03 NOTE — Telephone Encounter (Signed)
Rx sent to the pharmacy by e-script.//AB/CMA 

## 2013-08-31 ENCOUNTER — Telehealth: Payer: Self-pay | Admitting: Pulmonary Disease

## 2013-08-31 MED ORDER — HYDROCODONE-ACETAMINOPHEN 5-325 MG PO TABS: 1.0000 | ORAL_TABLET | Freq: Every day | ORAL | Status: DC

## 2013-08-31 NOTE — Telephone Encounter (Signed)
Per phone note 03/18/14: Kathee Delton, MD at 03/28/2013 5:40 PM     Status: Signed        Ok to send in 30d supply of norco, with 6 fills.  --  Pt had refills on norco 04/28/13,05/26/13,06/25/13,07/31/13. I have printed off RX for [place] to sign since he okay'd for pt to have 6 refills in November. Will call pt once ready for pick up. Please advise Ashtyn thanks

## 2013-08-31 NOTE — Telephone Encounter (Signed)
Pt aware that Rx ready for pick up. Placed in folder up front. Nothing further needed.

## 2013-09-18 ENCOUNTER — Encounter: Payer: Self-pay | Admitting: Neurology

## 2013-09-19 ENCOUNTER — Other Ambulatory Visit: Payer: Self-pay | Admitting: Internal Medicine

## 2013-09-28 ENCOUNTER — Other Ambulatory Visit: Payer: Self-pay | Admitting: Pulmonary Disease

## 2013-09-29 ENCOUNTER — Telehealth: Payer: Self-pay | Admitting: Pulmonary Disease

## 2013-09-29 NOTE — Telephone Encounter (Signed)
Brand Males, MD at 09/29/2013 1:44 PM     Status: Signed        Ok to refill only once for 30 days based on my chart review.  Dr. Brand Males, M.D., [facility].C.P  Pulmonary and Critical Care Medicine  Staff Physician  [place] [facility]  Pager: [phone], If no answer or between 15:00h - 7:00h: call [phone]  09/29/2013  1:44 PM   Pt's husband is aware that they can come by the office now to pick up Rx. MR signed the Rx and I have placed at front with Erin Roach as the patient is on her way now. Nothing more needed at this time.

## 2013-09-29 NOTE — Telephone Encounter (Signed)
Pt is requesting a refill on hydrocodone 5-325 1 at bedtime. Last refill on 08-31-13. Since Dr. [facility] is out of office I will ask Doc- of the day if he is ok to sing rx for refill. Please advise.Jupiter Bing, CMA No Known Allergies

## 2013-09-29 NOTE — Telephone Encounter (Signed)
Ok to refill only once for 30 days based on my chart review.   Dr. Brand Males, M.D., [facility].C.P Pulmonary and Critical Care Medicine Staff Physician [facility] Pager: [phone], If no answer or between  15:00h - 7:00h: call [phone]  09/29/2013 1:44 PM

## 2013-10-03 ENCOUNTER — Encounter: Payer: Self-pay | Admitting: Neurology

## 2013-10-04 ENCOUNTER — Telehealth: Payer: Self-pay | Admitting: *Deleted

## 2013-10-04 MED ORDER — PREGABALIN 75 MG PO CAPS: 75.0000 mg | ORAL_CAPSULE | Freq: Three times a day (TID) | ORAL | Status: DC

## 2013-10-04 NOTE — Telephone Encounter (Signed)
Spoke with Nira Conn at pharmacy to let her know that the Pramipexole was not replacing the Lyrica .

## 2013-10-04 NOTE — Telephone Encounter (Signed)
Lyrica refill called to patient's pharmacy.

## 2013-10-29 ENCOUNTER — Other Ambulatory Visit: Payer: Self-pay | Admitting: Internal Medicine

## 2013-10-30 ENCOUNTER — Telehealth: Payer: Self-pay | Admitting: Pulmonary Disease

## 2013-10-30 MED ORDER — HYDROCODONE-ACETAMINOPHEN 5-325 MG PO TABS: ORAL_TABLET | ORAL | Status: DC

## 2013-10-30 NOTE — Telephone Encounter (Signed)
Last refill was by MR 09/29/13 #30 x 0 refills in place of [place] absence.  Pt last OV 01/31/13 Pt has no pending appt. Please advise [place] thanks

## 2013-10-30 NOTE — Telephone Encounter (Signed)
Spoke with the pt and notified rx is ready for pick up  She verbalized understanding and nothing further needed

## 2013-10-30 NOTE — Telephone Encounter (Signed)
RX printed, signed, placed for pick up upfront LMTCB x1 for pt

## 2013-10-30 NOTE — Telephone Encounter (Signed)
Ok to fill with refills if able.

## 2013-10-31 ENCOUNTER — Other Ambulatory Visit: Payer: Self-pay | Admitting: Internal Medicine

## 2013-11-29 ENCOUNTER — Telehealth: Payer: Self-pay | Admitting: Pulmonary Disease

## 2013-11-29 MED ORDER — HYDROCODONE-ACETAMINOPHEN 5-325 MG PO TABS
ORAL_TABLET | ORAL | Status: DC
Start: 2013-11-29 — End: 2013-12-28

## 2013-11-29 NOTE — Telephone Encounter (Signed)
Pt requesting refill of Hydrocodone 5-325 tabs #30 x 0RF Pt takes this 1 tablet nightly at bedtime for RLS Patient Instructions 09/28/12     Stop horizant  Take mirapex 0.25mg , 2 either late afternoon or after dinner each day.  Take hydrocodone 5mg  each night at bedtime.  Please keep diary about discomfort, and give me an update in 2-3 weeks.   Pt last seen 01/31/13 and was continued on this medication at this OV Pt was since referred to Neurology--seen by Dr Tat 06/21/2013 No upcoming OV with [facility] listed.  Last filled 10/30/13 # 30 x 0RF  Please advise Dr Joya Gaskins if okay to refill as Dr Gwenette Greet is not in office. Thanks.

## 2013-11-29 NOTE — Telephone Encounter (Signed)
Ok to refill x 1 then needs OV with Dr Gwenette Greet later

## 2013-11-29 NOTE — Telephone Encounter (Signed)
Rx printed and given to Dr Joya Gaskins to sign Placed in envelope and put up front to be picked up Pt aware. ---------  Dr Gwenette Greet,  Pt states that she has seen Dr Tat as referred  06/21/13 Upcoming visit - 74month f/u with Dr Tat is 12/19/2013 Pt states that her medication regimen has been changed-- new addition Pt states that she is now taking Lyrica, Mirapex and Hydrocodone at night. The only problem is that Dr Tat cannot prescribe the Hyrdocodone (per the patient) Pt is requesting that until Dr Tat takes her off the Hydrocodone, can Dr Gwenette Greet continue to refill this for her?  Please advise Dr Gwenette Greet. Thanks. Pt aware that [place] not back in office until 12/11/13

## 2013-12-01 ENCOUNTER — Other Ambulatory Visit: Payer: Self-pay | Admitting: Internal Medicine

## 2013-12-01 NOTE — Telephone Encounter (Signed)
Pt aware. Erin Roach, CMA  

## 2013-12-01 NOTE — Telephone Encounter (Signed)
I am more than happy to keep her vicodin filled.

## 2013-12-04 ENCOUNTER — Other Ambulatory Visit (INDEPENDENT_AMBULATORY_CARE_PROVIDER_SITE_OTHER): Payer: Medicare Other

## 2013-12-04 ENCOUNTER — Encounter: Payer: Self-pay | Admitting: Internal Medicine

## 2013-12-04 ENCOUNTER — Ambulatory Visit (INDEPENDENT_AMBULATORY_CARE_PROVIDER_SITE_OTHER): Payer: Medicare Other | Admitting: Internal Medicine

## 2013-12-04 VITALS — BP 110/70 | HR 89 | Temp 98.4°F | Wt 202.2 lb

## 2013-12-04 DIAGNOSIS — R609 Edema, unspecified: Secondary | ICD-10-CM

## 2013-12-04 DIAGNOSIS — I10 Essential (primary) hypertension: Secondary | ICD-10-CM

## 2013-12-04 NOTE — Progress Notes (Signed)
   Subjective:    Patient ID: Erin Roach, female    DOB: 1940-09-03, 73 y.o.   MRN: [id number]  HPI  She was flushed over the weekend which prompted her check her blood pressure. She's been monitoring it and it has ranged from 135/84-166/94. She has continued her spironolactone twice a day.  She also began to restrict sodium. She had been ingesting salt liberally prior to this  She has profound edema bilaterally. She has had traumatic injury to the right knee and the swelling was attributed to this by her orthopedist. She describes "skin hurting" with the edema.   Review of Systems  Significant headaches, epistaxis, chest pain, palpitations, exertional dyspnea, claudication, or paroxysmal nocturnal dyspnea absent.     Objective:   Physical Exam   Positive  or significant findings: As per CDC guidelines; EMR documents obesity. She has an S4 with a grade 1/2 systolic murmur She has 1+ pitting edema to mid shin bilaterally Pedal pulses are decreased by the edema.  Appears healthy and well-nourished & in no acute distress No carotid bruits are present.No neck pain distention present at 10 - 15 degrees. Thyroid normal to palpation Heart rhythm and rate are normal with no gallop Chest is clear with no increased work of breathing There is no evidence of aortic aneurysm or renal artery bruits Abdomen soft with no organomegaly or masses. No HJR No clubbing or cyanosis  present. No ischemic skin changes are present . Fingernails/ toenails healthy  Alert and oriented. Strength, tone, DTRs reflexes normal            Assessment & Plan:  #1 hypertension, essential. Today's blood pressure is excellent. She's been asked to bring her blood pressure cuff to all visits.  #2 Edema, dramatic  Plan: See orders recommendations

## 2013-12-04 NOTE — Patient Instructions (Addendum)
Your ideal BP goal = AVERAGE < 135/85.Minimal goal is average < 140/90. This AVERAGE should be calculated from @ least 5-7 BP readings taken @ different times of day on different days of week. You should not respond to isolated BP readings , but rather the AVERAGE for that week .Please bring your  blood pressure cuff to office visits to verify that it is reliable.It  can also be checked against the blood pressure device at the pharmacy. Finger or wrist cuffs are not dependable; an arm cuff is. Avoid ingestion of  excess salt/sodium.Cook with pepper & other spices . Use the salt substitute "No Salt" OR the Mrs Deliah Boston products to season food @ the table. Avoid foods which taste salty or "vinegary" as their sodium content will be high.

## 2013-12-04 NOTE — Progress Notes (Signed)
Pre visit review using our clinic review tool, if applicable. No additional management support is needed unless otherwise documented below in the visit note. 

## 2013-12-05 LAB — TSH: TSH: 5.16 u[IU]/mL — ABNORMAL HIGH (ref 0.35–4.50)

## 2013-12-05 LAB — BASIC METABOLIC PANEL
BUN: 15 mg/dL (ref 6–23)
CHLORIDE: 108 meq/L (ref 96–112)
CO2: 25 mEq/L (ref 19–32)
Calcium: 9.4 mg/dL (ref 8.4–10.5)
Creatinine, Ser: 0.8 mg/dL (ref 0.4–1.2)
GFR: 80.98 mL/min (ref >60.00)
Glucose, Bld: 106 mg/dL — ABNORMAL HIGH (ref 70–99)
Potassium: 4.3 mEq/L (ref 3.5–5.1)
SODIUM: 139 meq/L (ref 135–145)

## 2013-12-05 LAB — HEPATIC FUNCTION PANEL
ALBUMIN: 4.3 g/dL (ref 3.5–5.2)
ALK PHOS: 66 U/L (ref 39–117)
ALT: 35 U/L (ref 0–35)
AST: 33 U/L (ref 0–37)
BILIRUBIN DIRECT: 0.1 mg/dL (ref 0.0–0.3)
BILIRUBIN TOTAL: 0.4 mg/dL (ref 0.2–1.2)
Total Protein: 7.2 g/dL (ref 6.0–8.3)

## 2013-12-07 ENCOUNTER — Telehealth: Payer: Self-pay

## 2013-12-07 ENCOUNTER — Other Ambulatory Visit: Payer: Self-pay | Admitting: Internal Medicine

## 2013-12-07 DIAGNOSIS — R946 Abnormal results of thyroid function studies: Secondary | ICD-10-CM

## 2013-12-07 DIAGNOSIS — R7309 Other abnormal glucose: Secondary | ICD-10-CM

## 2013-12-07 MED ORDER — LEVOTHYROXINE SODIUM 50 MCG PO TABS
ORAL_TABLET | ORAL | Status: DC
Start: 2013-12-07 — End: 2014-03-02

## 2013-12-07 NOTE — Telephone Encounter (Signed)
Message copied by Shelly Coss on Thu Dec 07, 2013  8:36 AM ------      Message from: Hendricks Limes      Created: Thu Dec 07, 2013  7:22 AM       Please send new  Rx for  L thyroxine 50 mcg #90 to pharmacy.      1/2 pill X 10 days then 1 qd      F/U labs 10 weeks ------

## 2013-12-14 ENCOUNTER — Other Ambulatory Visit: Payer: Self-pay

## 2013-12-14 MED ORDER — NIACIN-SIMVASTATIN ER 500-20 MG PO TB24: ORAL_TABLET | ORAL | Status: DC

## 2013-12-19 ENCOUNTER — Ambulatory Visit (INDEPENDENT_AMBULATORY_CARE_PROVIDER_SITE_OTHER): Payer: Medicare Other | Admitting: Neurology

## 2013-12-19 ENCOUNTER — Encounter: Payer: Self-pay | Admitting: Neurology

## 2013-12-19 VITALS — BP 142/78 | HR 92 | Resp 16 | Ht 66.5 in | Wt 204.0 lb

## 2013-12-19 DIAGNOSIS — G2581 Restless legs syndrome: Secondary | ICD-10-CM

## 2013-12-19 DIAGNOSIS — G4700 Insomnia, unspecified: Secondary | ICD-10-CM

## 2013-12-19 DIAGNOSIS — R609 Edema, unspecified: Secondary | ICD-10-CM

## 2013-12-19 NOTE — Progress Notes (Signed)
Erin Roach was seen today in neurologic consultation at the request of Unice Cobble, MD.  The consultation is for the evaluation of RLS.  This is a second opinion.  The patient has seen Dr. Gwenette Greet and I reviewed his records.  The patient has had restless leg syndrome about 40 years ago, when she was pregnant. Over the years, it came and went but it has been "bad" for the last 20 + years.  She has been on multiple medications.  She has been on Requip, clonazepam, Neupro (site reaction) and Horizant.  She is now on pramipexole, 0.25 mg, 2 tablets at bedtime.   She tried quinine a long time ago and it worked great but it is no longer available for RLS.  She also uses Vicodin for restless leg syndrome.  She takes one at night.  It helps.  Her grandfather and father had similar.    She reports that she has most of the symptoms at night but sometimes during the day.  She has a burning pain in the legs that can get severe.  She always has to move them.  She gets up and walks during the middle of the night.  Her medications seem to be inconsistent.    06/21/13 update:  The patient presents today for restless leg followup.  She did have an EMG since last visit that did not demonstrate any evidence of a large fiber peripheral neuropathy.  She is on pramipexole 0.25 mg, 2 tablets after dinner and 2 tablets at bedtime.  She called me a few days ago because the symptoms were increasing, and I gave her samples of Lyrica.  She tried Lyrica 75 mg twice a day and got just a little bit groggy, so decided to just take it at night, and thus far it is working well.  She does remain on the Vicodin, one tablet at night.  12/19/13 update:  The patient is seen today in followup.  She has a history of restless leg syndrome.  She is on a number of medications for this.  She is on Mirapex 0.25 mg and takes 2 after dinner and 2 at night.  She is also on Lyrica 75 mg 1 after dinner and 2 tablets at night in addition to hydrocodone  at bedtime.  She knows if she does not take the Lyrica after dinner, then she has significant symptoms.  She has never tried not taking the hydrocodone.  She does state that she is having some difficulty sleeping at night, but it is not necessarily related to the restless leg.  She did have a fall and fractured her patella and has been having some difficulty getting back since then.  She had to cancel a cruise because of it.  She is just now able to walk.  She had swelling in the legs.  She states that she has always had some degree of swelling, but not like currently.  She asks me if I would sign a form so that she can do the bicycling program at the [facility].  This is a program that has traditionally been for Parkinson's patients but she talked to the organizer and she was told that she could join if she had a medical release.  PREVIOUS MEDICATIONS: n/a  ALLERGIES:  No Known Allergies  CURRENT MEDICATIONS:  Current Outpatient Prescriptions on File Prior to Visit  Medication Sig Dispense Refill  . aspirin 81 MG tablet Take 81 mg by mouth daily.       [name]  HYDROcodone-acetaminophen (NORCO/VICODIN) 5-325 MG per tablet TAKE 1 TABLET BY MOUTH AT BEDTIME  30 tablet  0  . levothyroxine (SYNTHROID, LEVOTHROID) 50 MCG tablet Take 1/2 pill x 10 days then 1 pill daily FOLLOW UP LABS IN 10 WEEKS  90 tablet  0  . MYRBETRIQ 25 MG TB24 tablet TAKE 2 TABLETS BY MOUTH DAILY  60 tablet  1  . niacin-simvastatin (SIMCOR) 500-20 MG 24 hr tablet TAKE 1 TABLET BY MOUTH AT BEDTIME.  30 tablet  5  . pramipexole (MIRAPEX) 0.25 MG tablet Take 2 tablet in the afternoon and 2 tablets after dinner.  360 tablet  3  . spironolactone (ALDACTONE) 25 MG tablet TAKE 1 TABLET BY MOUTH TWICE DAILY , *PATIENT NEEDS COMPLETE PHYSICAL*  60 tablet  0  . [DISCONTINUED] VESICARE 10 MG tablet TAKE 1 TABLET EVERY DAY  30 tablet  0   No current facility-administered medications on file prior to visit.    PAST MEDICAL HISTORY:   Past Medical  History  Diagnosis Date  . Venous insufficiency     legs  . Leg edema     bilateral  . RLS (restless legs syndrome)   . Esophageal reflux   . Urinary incontinence   . Skin cancer     hx of squamous cell buttocks area, Yevette Edwards MD,Gen Surgery: Amy Martinique MD, derm  . Asthma   . Snoring     no sleep study  . Heart murmur     PAST SURGICAL HISTORY:   Past Surgical History  Procedure Laterality Date  . Appendectomy    . Tonsillectomy    . Skin cancer removal      squamous cell interglureal space  . Ulnar nerve surgery      04/20/13:  pt denies  . Colonoscopy      neg; Dr Collene Mares  . Cystoscopy  2004    ? negative, Dr Jeffie Pollock    SOCIAL HISTORY:   History   Social History  . Marital Status: Married    Spouse Name: N/A    Number of Children: N/A  . Years of Education: N/A   Occupational History  . retired Pharmacist, hospital    Social History Main Topics  . Smoking status: Former Smoker -- 0.50 packs/day for 15 years    Types: Cigarettes  . Smokeless tobacco: Never Used     Comment: smoked age [age], up to 1 ppd  . Alcohol Use: 1.8 oz/week    3 Glasses of wine per week     Comment: socially  . Drug Use: No  . Sexual Activity: Not on file   Other Topics Concern  . Not on file   Social History Narrative   2 children ages 51,35   Regular exercise no    FAMILY HISTORY:   Family Status  Relation Status Death Age  . Father Deceased 89    rls, esophageal CA  . Mother Alive     healthy  . Brother Alive     healthy  . Sister Alive     healthy  . Child Alive     2, alive and well    ROS:  A complete 10 system review of systems was obtained and was unremarkable apart from what is mentioned above.  PHYSICAL EXAMINATION:    VITALS:   Filed Vitals:   12/19/13 1443  BP: 142/78  Pulse: 92  Resp: 16  Height: 5' 6.5" (1.689 m)  Weight: 204 lb (92.534 kg)   Wt Readings  from Last 3 Encounters:  12/19/13 204 lb (92.534 kg)  12/04/13 202 lb 3.2 oz (91.717 kg)    06/21/13 194 lb (87.998 kg)     GEN:  Normal appears female in no acute distress.  Appears younger than stated age 62:  Normocephalic, atraumatic. The mucous membranes are moist. The superficial temporal arteries are without ropiness or tenderness. Cardiovascular: Regular rate and rhythm. Lungs: Clear to auscultation bilaterally. Neck/Heme: There are no carotid bruits noted bilaterally. MS:  Is signif LE pitting edema bilaterally  NEUROLOGICAL: Orientation:  The patient is alert and oriented x 3.  Fund of knowledge is appropriate.  Recent and remote memory intact.  Attention span and concentration normal.  Repeats and names without difficulty. Cranial nerves: There is good facial symmetry. The pupils are equal round and reactive to light bilaterally. Fundoscopic exam reveals clear disc margins bilaterally. Extraocular muscles are intact and visual fields are full to confrontational testing. Speech is fluent and clear. Soft palate rises symmetrically and there is no tongue deviation. Hearing is intact to conversational tone. Tone: Tone is good throughout. Sensation: Sensation is intact to light touch and pinprick throughout (facial, trunk, extremities). Vibration is decreased distally. There is no extinction with double simultaneous stimulation. There is no sensory dermatomal level identified. Coordination:  The patient has no difficulty with RAM's or FNF bilaterally. Motor: Strength is 5/5 in the bilateral upper and lower extremities.  Shoulder shrug is equal and symmetric. There is no pronator drift.  There are no fasciculations noted.   Lab Results  Component Value Date   EXHBZJIR67 893 04/20/2013   Lab Results  Component Value Date   HGBA1C 5.5 08/22/2010     Chemistry      Component Value Date/Time   NA 139 12/04/2013 1724   K 4.3 12/04/2013 1724   CL 108 12/04/2013 1724   CO2 25 12/04/2013 1724   BUN 15 12/04/2013 1724   CREATININE 0.8 12/04/2013 1724      Component Value  Date/Time   CALCIUM 9.4 12/04/2013 1724   ALKPHOS 66 12/04/2013 1724   AST 33 12/04/2013 1724   ALT 35 12/04/2013 1724   BILITOT 0.4 12/04/2013 1724     Lab Results  Component Value Date   WBC 6.2 02/08/2012   HGB 12.9 02/08/2012   HCT 38.3 02/08/2012   MCV 87.6 02/08/2012   PLT 203 02/08/2012   Lab Results  Component Value Date   TSH 5.16* 12/04/2013   Lab Results  Component Value Date   IRON 70 04/12/2012   FERRITIN 33.5 04/20/2013      IMPRESSION/PLAN  1. RLS  -The pt has a long hx and a strong fam hx of RLS  -She will remain on the mirapex 0.25 mg - 2 after dinner and 2 at bedtime.  We did talk about the phenomenon of augmentation, specifically associated with these dopamine agonists, but decided to stay on the Mirapex for now.    -She seems to be doing well with the Lyrica 75 mg, one after dinner and 2 tablets per night and can continue on that.  A prescription was written for that.  -I asked her to try and cut out the hydrocodone and see if sx's change.  If not, she can stay off of it.  If so, she can just go back on it.  -In the future, if we continue to struggle, then we will consider adding Klonopin back again, trying Topamax or even levodopa.  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  She and I discussed this today. 2.  Insomnia  -Is independent of RLS  -Talked about the importance of exercise.  She did ask me to sign a form so that she can join the cycling program at the [facility].  I did this, but I did specify that she did not have Parkinson's, as one of the criteria to join the program was that the patient have Parkinson's disease.  She stated that she had very talked to the organizer about this, and they did not feel that this was a big issue.  -I. did tell her that she could try melatonin, 2 mg or 3 mg to see if that would be helpful.  I really would like to avoid adding more medication. 3.   lower extremity edema.  -I. did tell her that the Mirapex and/or the Lyrica could be contributing.  She told me that she understood, but did not want to get off of these medications as they have been dramatically helpful. 4.  followup in 6 months.

## 2013-12-28 ENCOUNTER — Telehealth: Payer: Self-pay | Admitting: Pulmonary Disease

## 2013-12-28 MED ORDER — HYDROCODONE-ACETAMINOPHEN 5-325 MG PO TABS: ORAL_TABLET | ORAL | Status: DC

## 2013-12-28 NOTE — Telephone Encounter (Signed)
Absolutely ok to fill

## 2013-12-28 NOTE — Telephone Encounter (Signed)
Dr Gwenette Greet  - Please advise if ok to refill Hydrocodone.  Pt seen Dr Tat 2 days ago and she is unable to fill this.  Please advise

## 2013-12-28 NOTE — Telephone Encounter (Signed)
[place] has left for the day. CY please advise if you are ok signing this order.

## 2013-12-28 NOTE — Telephone Encounter (Signed)
Per CY- ok to sign off on refill. Rx signed by CY and placed up front for pick up. Pt aware. Nothing further needed.

## 2013-12-29 ENCOUNTER — Ambulatory Visit: Payer: Medicare Other | Admitting: Podiatrist

## 2014-01-23 ENCOUNTER — Encounter: Payer: Medicare Other | Admitting: Internal Medicine

## 2014-01-24 ENCOUNTER — Ambulatory Visit: Payer: Medicare Other | Admitting: Internal Medicine

## 2014-01-24 ENCOUNTER — Other Ambulatory Visit: Payer: Self-pay | Admitting: Internal Medicine

## 2014-01-30 ENCOUNTER — Telehealth: Payer: Self-pay | Admitting: Pulmonary Disease

## 2014-01-30 ENCOUNTER — Telehealth: Payer: Self-pay | Admitting: Neurology

## 2014-01-30 MED ORDER — HYDROCODONE-ACETAMINOPHEN 5-325 MG PO TABS: ORAL_TABLET | ORAL | Status: DC

## 2014-01-30 NOTE — Telephone Encounter (Signed)
Pt last had refill on norco 12/28/13 1 tablet at bedtime #30. Pt is given this for her RLS. [place] is on vacation Last OV 01/31/13 No pending appt. Please advise SN thanks

## 2014-01-30 NOTE — Telephone Encounter (Signed)
Needs to talk to someone about medication [phone]

## 2014-01-30 NOTE — Telephone Encounter (Signed)
Spouse called back asking for rx to be signed ASAP I advised that Dr Lenna Gilford is seeing the pt's at this time  I again advised that we will call the pt once the rx is signed  He verbalized understanding

## 2014-01-30 NOTE — Telephone Encounter (Signed)
Per SN she has not seen [place] in almost 1 year. She needs OV. RX has been signed by 2 different doc the past 2 times she has needed this refilled. Dr. Hall Busing can't fill RX's.  Called pt. She reports her and [place] had an understanding that she would not have to f/u with him since she was seeing Dr. Hall Busing but he would keep writing for her norco. She reports she is out of the norco and needs this tonight! RX has been printed and placed on SN cart for signature. Pt will be called once ready.  Please advise [place] on appt for pt? thanks

## 2014-01-30 NOTE — Telephone Encounter (Signed)
Spoke with patient's husband. He wanted Dr Tat to call Dr Janifer Adie office to have them sign a prescription for Vicodin. I made the husband aware that we do not prescribe that medication and we can not make another doctor write the prescription for her either. He will contact Dr Janifer Adie office to discuss with them.

## 2014-01-30 NOTE — Telephone Encounter (Signed)
Pt aware RX placed for pick up.  Please advise [place] thanks

## 2014-02-08 NOTE — Telephone Encounter (Signed)
Spoke with pt and advised that ov is needed.  Pt is driving now and will call back in a few hours to schedule f/u appt.

## 2014-02-08 NOTE — Telephone Encounter (Signed)
Please advise [place] when pt will need OV? thanks

## 2014-02-08 NOTE — Telephone Encounter (Signed)
Probably need to see her in next 2-3 mos. Sometime this year.

## 2014-02-08 NOTE — Telephone Encounter (Signed)
Please advise [place] if this message can be closed. Anything further needed?

## 2014-03-02 ENCOUNTER — Telehealth: Payer: Self-pay | Admitting: Pulmonary Disease

## 2014-03-02 ENCOUNTER — Other Ambulatory Visit: Payer: Self-pay | Admitting: Internal Medicine

## 2014-03-02 ENCOUNTER — Other Ambulatory Visit: Payer: Self-pay | Admitting: *Deleted

## 2014-03-02 MED ORDER — HYDROCODONE-ACETAMINOPHEN 5-325 MG PO TABS: ORAL_TABLET | ORAL | Status: DC

## 2014-03-02 NOTE — Telephone Encounter (Signed)
rx printed and placed at front. Pt is aware. [place] Bing, CMA

## 2014-03-02 NOTE — Telephone Encounter (Signed)
Pt calling again a/b prescript and was going to have husband to stop by and pick up since he is already in town, please advise.Erin Roach

## 2014-03-02 NOTE — Telephone Encounter (Signed)
KC, pt has ov pending for 12/2 Please advise if okay to refill hydrocodone  She wants to pick up rx today  Please advise, thanks!

## 2014-03-02 NOTE — Telephone Encounter (Signed)
Dr. Gwenette Greet please advise if it is ok to refill hydrocodone as stated below. Message closed by mistake. [place] Bing, CMA

## 2014-03-02 NOTE — Telephone Encounter (Signed)
Ok with me, and ok to fill monthly

## 2014-03-02 NOTE — Addendum Note (Signed)
Addended by: Lilli Few on: 03/02/2014 03:46 PM   Modules accepted: Orders

## 2014-03-17 ENCOUNTER — Encounter: Payer: Self-pay | Admitting: Neurology

## 2014-03-19 ENCOUNTER — Encounter: Payer: Self-pay | Admitting: Neurology

## 2014-03-24 ENCOUNTER — Telehealth: Payer: Self-pay

## 2014-03-24 NOTE — Telephone Encounter (Signed)
Called patient//LMWO advising pt to call back. Need to know if Dr. Linna Darner is still her PCP and need to get scheduled for her annual medicare physical for 2015.

## 2014-03-26 ENCOUNTER — Other Ambulatory Visit: Payer: Self-pay | Admitting: Neurology

## 2014-03-26 MED ORDER — PREGABALIN 75 MG PO CAPS: ORAL_CAPSULE | ORAL | Status: DC

## 2014-03-26 NOTE — Telephone Encounter (Signed)
Lyrica refill requested. Per last office note- patient to remain on medication. Refill approved and called to patient's pharmacy.

## 2014-03-29 ENCOUNTER — Telehealth: Payer: Self-pay | Admitting: Pulmonary Disease

## 2014-03-29 NOTE — Telephone Encounter (Signed)
Pt is calling in for refill of her hydrocodone.  This was last filled by [facility] on 03/02/14.  Last ov with [facility]  01/31/14 Next ov with [facility]  04/11/14  [place] please advise. thanks

## 2014-03-30 MED ORDER — HYDROCODONE-ACETAMINOPHEN 5-325 MG PO TABS: ORAL_TABLET | ORAL | Status: DC

## 2014-03-30 NOTE — Telephone Encounter (Signed)
Called and spoke to pt. Informed pt of the refill. Pt verbalized understanding and stated she would pick up the script today. Rx printed and placed on KC's desk to be signed.   Will forward to [place].

## 2014-03-30 NOTE — Telephone Encounter (Signed)
Ok to fill.  Is there a way to have a list or mark the chart of those that I see regularly and prescribe controlled substances monthly so we don't have to keep sending messages back and forth?

## 2014-03-30 NOTE — Telephone Encounter (Signed)
RX has been placed upfront for pick up. Called pt and LMTCB x1

## 2014-04-11 ENCOUNTER — Ambulatory Visit (INDEPENDENT_AMBULATORY_CARE_PROVIDER_SITE_OTHER): Payer: Medicare Other | Admitting: Pulmonary Disease

## 2014-04-11 ENCOUNTER — Encounter: Payer: Self-pay | Admitting: Pulmonary Disease

## 2014-04-11 VITALS — BP 122/64 | HR 86 | Temp 98.9°F | Ht 66.5 in | Wt 205.0 lb

## 2014-04-11 DIAGNOSIS — G2581 Restless legs syndrome: Secondary | ICD-10-CM

## 2014-04-11 NOTE — Assessment & Plan Note (Signed)
The patient overall is doing fairly well on her current regimen of Mirapex, Lyrica, and hydrocodone at bedtime. She is really unsure if the Lyrica has made a difference or not, and I have asked her to experiment with the dosing. I have also stressed to her the importance of daily exercise if possible, and pelvis typically helps the restless leg syndrome.

## 2014-04-11 NOTE — Progress Notes (Signed)
   Subjective:    Patient ID: Erin Roach, female    DOB: 02-05-1941, 73 y.o.   MRN: [id number]  HPI The patient comes in today for follow-up of her restless leg syndrome which is quite severe. She has been also seeing neurology, and I greatly appreciate their input. She is having to take Mirapex, Lyrica, and hydrocodone at bedtime in order to control her restless leg symptoms. She is trying to increase her exercise at this point, and I stressed to her the importance of doing so.   Review of Systems  Constitutional: Negative for fever and unexpected weight change.  HENT: Negative for congestion, dental problem, ear pain, nosebleeds, postnasal drip, rhinorrhea, sinus pressure, sneezing, sore throat and trouble swallowing.   Eyes: Negative for redness and itching.  Respiratory: Negative for cough, chest tightness, shortness of breath and wheezing.   Cardiovascular: Negative for palpitations and leg swelling.  Gastrointestinal: Negative for nausea and vomiting.  Genitourinary: Negative for dysuria.  Musculoskeletal: Negative for joint swelling.  Skin: Negative for rash.  Neurological: Negative for headaches.  Hematological: Does not bruise/bleed easily.  Psychiatric/Behavioral: Negative for dysphoric mood. The patient is not nervous/anxious.        Objective:   Physical Exam Overweight female in no acute distress Nose without purulence or discharge noted Neck without lymphadenopathy or thyromegaly Lower extremities with no significant edema, no cyanosis Alert and oriented, moves all 4 extremities.       Assessment & Plan:

## 2014-04-11 NOTE — Patient Instructions (Signed)
Continue on your medications for restless legs, but can experiment with the dosing of lyrica to see if it really helps. Try to exercise everyday if possible.   followup with me again in one year, and we will continue to fill your hydrocodone.

## 2014-04-17 ENCOUNTER — Other Ambulatory Visit: Payer: Self-pay | Admitting: Neurology

## 2014-04-17 MED ORDER — PRAMIPEXOLE DIHYDROCHLORIDE 0.25 MG PO TABS: ORAL_TABLET | ORAL | Status: DC

## 2014-04-17 NOTE — Telephone Encounter (Signed)
Mirapex refill requested. Per last office note- patient to remain on medication. Refill approved and sent to patient's pharmacy.   

## 2014-04-20 ENCOUNTER — Ambulatory Visit (INDEPENDENT_AMBULATORY_CARE_PROVIDER_SITE_OTHER): Payer: Medicare Other | Admitting: Podiatry

## 2014-04-20 ENCOUNTER — Encounter: Payer: Self-pay | Admitting: Podiatry

## 2014-04-20 VITALS — BP 132/86 | HR 72 | Resp 12

## 2014-04-20 DIAGNOSIS — M79676 Pain in unspecified toe(s): Secondary | ICD-10-CM

## 2014-04-20 DIAGNOSIS — B351 Tinea unguium: Secondary | ICD-10-CM

## 2014-04-24 NOTE — Progress Notes (Signed)
Patient ID: MALANA EBERWEIN, female   DOB: 1940-09-29, 73 y.o.   MRN: [id number]  Subjective: 73 year old female returns office today for elongated, thick toenails. She presents and put on Kerydin for onychomycosis however she has not completed the course of she states that she did not feel that is working. She denies any redness or drainage from around the nail sites and the nails are painful particularly with shoe gear when they are elongated. No other complaints at this time.  Objective: AAO 3, NAD DP/PT pulses palpable bilaterally, CRT less than 3 seconds Protective sensation intact with Simms Weinstein monofilament, vibratory sensation intact, Achilles tendon reflex intact. Nails hypertrophic, dystrophic, elongated, discolored 10. No surrounding erythema or drainage from around the nail sites. No open lesions or pre-ulcerative lesions. No areas of pinpoint bony tenderness or pain with vibratory sensation. MMT 5/5, ROM WNL No pain with calf compression, swelling, warmth, erythema  Assessment: 73 year old female with symptomatic onychomycosis  Plan: -Treatment options were discussed including alternatives, risks, complications. -Recently patient has been on Lamisil without any resolution of symptoms and she is also tried [place]. I recommended nail biopsy and discuss further treatments for onychomycosis. However, at this time the patient elects not to proceed with further treatment. -Nail sharply debrided 10 without complications. -Follow-up as needed. In the meantime, call the office with any questions, concerns, change in symptoms.

## 2014-04-30 ENCOUNTER — Telehealth: Payer: Self-pay | Admitting: Pulmonary Disease

## 2014-04-30 MED ORDER — HYDROCODONE-ACETAMINOPHEN 5-325 MG PO TABS: ORAL_TABLET | ORAL | Status: DC

## 2014-04-30 NOTE — Telephone Encounter (Signed)
Spoke with pt, is requesting hydrocodone refill.  Last filled 03/30/14 #30.    Patient Instructions     Continue on your medications for restless legs, but can experiment with the dosing of lyrica to see if it really helps. Try to exercise everyday if possible.  followup with me again in one year, and we will continue to fill your hydrocodone.    Pt aware that this will be up front for pick up.  Nothing further needed.

## 2014-05-05 ENCOUNTER — Other Ambulatory Visit: Payer: Self-pay | Admitting: Internal Medicine

## 2014-05-12 ENCOUNTER — Other Ambulatory Visit: Payer: Self-pay | Admitting: Internal Medicine

## 2014-05-31 ENCOUNTER — Other Ambulatory Visit (HOSPITAL_COMMUNITY)
Admission: RE | Admit: 2014-05-31 | Discharge: 2014-05-31 | Disposition: A | Discharge status: Home / Self Care | Payer: Medicare Other | Source: Ambulatory Visit | Attending: Obstetrics and Gynecology | Admitting: Obstetrics and Gynecology

## 2014-05-31 ENCOUNTER — Other Ambulatory Visit: Payer: Self-pay | Admitting: Nurse Practitioner

## 2014-05-31 DIAGNOSIS — Z124 Encounter for screening for malignant neoplasm of cervix: Secondary | ICD-10-CM | POA: Insufficient documentation

## 2014-06-01 ENCOUNTER — Telehealth: Payer: Self-pay | Admitting: Internal Medicine

## 2014-06-01 NOTE — Telephone Encounter (Signed)
Erin Roach 06/01/2014 - called . She is out of hydrocodone for her RLS. Suppsosed to have come to office to pick up Rx script but snowed in and office closed. She is asking for 3d worth hydrocodone. Advised her to to go to an urgent care or ER and have the service there callPCCM pager if needed through [place] and the PCCM MD and can review chart exercise independent judgement and agree on 3d hydrocodone  She verbalized understanding  Dr. Brand Males, M.D., [facility].C.P Pulmonary and Critical Care Medicine Staff Physician [facility] Pager: [phone], If no answer or between  15:00h - 7:00h: call [phone]  06/01/2014 11:38 AM

## 2014-06-04 MED ORDER — HYDROCODONE-ACETAMINOPHEN 5-325 MG PO TABS: ORAL_TABLET | ORAL | Status: DC

## 2014-06-04 NOTE — Telephone Encounter (Signed)
Yes, I am ok with this 

## 2014-06-04 NOTE — Telephone Encounter (Signed)
Hydrocodone rx printed but could not be backdated for 05/31/14.  Therefore, it was voided and placed in shred bin.  Written [place] script rx completed and signed by Dr. Gwenette Greet.  Original placed at front for pick up and copy placed in scan folder.  Pt aware, was very appreciative, and voiced no further questions or concerns at this time.

## 2014-06-04 NOTE — Telephone Encounter (Signed)
Pt is needing a refill on her hydracodone needs this will pick it up  She borrowed 3 pills from someone else and needs rx written for the 01/21 so she can give them back   [phone] is the pt number

## 2014-06-04 NOTE — Telephone Encounter (Signed)
Spoke with pt, states that her hydrocodone rx was due to be picked up on Friday, but she was unable to get it as the office was closed.  Pt is requesting her hydrocodone refill, but wants it backdated to 1/21 when it was supposed to be picked up so she can stay on track.  Pt also states that she had to borrow some hydrocodone tablets from a friend to get through the weekend and needs her whole backdated rx to return the borrowed pills.  MR are you ok with backdating the hydrocodone rx to 1/21 to keep pt on track?  Thanks!

## 2014-06-05 LAB — CYTOLOGY - PAP

## 2014-06-14 ENCOUNTER — Telehealth: Payer: Self-pay | Admitting: *Deleted

## 2014-06-14 NOTE — Telephone Encounter (Signed)
Patient canceled follow up appointment will call to reschedule at a later time

## 2014-06-21 ENCOUNTER — Ambulatory Visit: Payer: Medicare Other | Admitting: Neurology

## 2014-06-29 ENCOUNTER — Telehealth: Payer: Self-pay | Admitting: Pulmonary Disease

## 2014-06-29 MED ORDER — HYDROCODONE-ACETAMINOPHEN 5-325 MG PO TABS: ORAL_TABLET | ORAL | Status: DC

## 2014-06-29 NOTE — Telephone Encounter (Signed)
Called made pt aware RX placed for pick up

## 2014-06-29 NOTE — Telephone Encounter (Signed)
Pt calling back [phone]

## 2014-06-29 NOTE — Telephone Encounter (Signed)
lmomtcb x1 

## 2014-06-29 NOTE — Telephone Encounter (Signed)
RX has been printed and will have [place] sign.

## 2014-06-29 NOTE — Telephone Encounter (Signed)
Called and spoke with pt and she stated that she will be out of the hydrocodone tomorrow and wanted to come by and pick up rx for this today. Last filled on 05/31/2014.   [place] please advise. Thanks  No Known Allergies  Current Outpatient Prescriptions on File Prior to Visit  Medication Sig Dispense Refill  . aspirin 81 MG tablet Take 81 mg by mouth daily.     [name] HYDROcodone-acetaminophen (NORCO/VICODIN) 5-325 MG per tablet TAKE 1 TABLET BY MOUTH AT BEDTIME. OK per [place] to refill every 30 days on schedule- if pt is compliant with OV. 30 tablet 0  . levothyroxine (SYNTHROID, LEVOTHROID) 50 MCG tablet TAKE 1/2 TABLET BY MOUTH DAILY FOR 10 DAYS, THEN 1 TABLET DAILY *FOLLOW-UP LABS IN 10 WEEKS* 90 tablet 0  . MYRBETRIQ 25 MG TB24 tablet TAKE 2 TABLETS BY MOUTH DAILY 60 tablet 5  . pramipexole (MIRAPEX) 0.25 MG tablet Take 2 tablet in the afternoon and 2 tablets after dinner. 360 tablet 1  . pregabalin (LYRICA) 75 MG capsule 1 afternoon, 2 evening 270 capsule 1  . SIMCOR 500-20 MG 24 hr tablet TAKE 1 TABLET BY MOUTH AT BEDTIME. 30 tablet 4  . spironolactone (ALDACTONE) 25 MG tablet TAKE 1 TABLET BY MOUTH TWICE DAILY , *PATIENT NEEDS COMPLETE PHYSICAL* 60 tablet 5  . [DISCONTINUED] VESICARE 10 MG tablet TAKE 1 TABLET EVERY DAY 30 tablet 0   No current facility-administered medications on file prior to visit.

## 2014-08-01 ENCOUNTER — Telehealth: Payer: Self-pay | Admitting: Pulmonary Disease

## 2014-08-01 MED ORDER — HYDROCODONE-ACETAMINOPHEN 5-325 MG PO TABS: ORAL_TABLET | ORAL | Status: DC

## 2014-08-01 NOTE — Telephone Encounter (Signed)
Message not needed/spm °

## 2014-08-01 NOTE — Telephone Encounter (Signed)
Rx has been printed and signed by [facility]. Pt is aware that it's ready for her to pick up. Nothing further was needed.

## 2014-08-31 ENCOUNTER — Telehealth: Payer: Self-pay | Admitting: Pulmonary Disease

## 2014-08-31 MED ORDER — HYDROCODONE-ACETAMINOPHEN 5-325 MG PO TABS: ORAL_TABLET | ORAL | Status: DC

## 2014-08-31 NOTE — Telephone Encounter (Signed)
Pt cb, [phone]

## 2014-08-31 NOTE — Telephone Encounter (Signed)
Pt calling requesting refill of Hydrocodone 5/325 Per [place] okay to refill monthly if patient is up to date on OV and not having acute symptoms.  Last filled 08/01/14 Last OV 04/2014, advised to follow up x 1 year.  Rx printed with notes to not fill until 09/01/14 Nothing further needed.

## 2014-08-31 NOTE — Telephone Encounter (Signed)
lmtcb for pt.  

## 2014-09-14 ENCOUNTER — Other Ambulatory Visit: Payer: Self-pay

## 2014-09-14 MED ORDER — MIRABEGRON ER 25 MG PO TB24: 50.0000 mg | ORAL_TABLET | Freq: Every day | ORAL | Status: DC

## 2014-09-15 ENCOUNTER — Other Ambulatory Visit: Payer: Self-pay | Admitting: Internal Medicine

## 2014-09-20 ENCOUNTER — Other Ambulatory Visit: Payer: Self-pay | Admitting: Neurology

## 2014-09-20 NOTE — Telephone Encounter (Signed)
Refill for Lyrica denied. Patient needs follow up appt. Note faxed to [place].

## 2014-09-26 ENCOUNTER — Ambulatory Visit: Payer: Medicare Other | Admitting: Neurology

## 2014-09-28 ENCOUNTER — Ambulatory Visit (INDEPENDENT_AMBULATORY_CARE_PROVIDER_SITE_OTHER): Payer: Medicare Other | Admitting: Neurology

## 2014-09-28 ENCOUNTER — Encounter: Payer: Self-pay | Admitting: Neurology

## 2014-09-28 VITALS — BP 138/84 | HR 136 | Ht 67.0 in | Wt 203.0 lb

## 2014-09-28 DIAGNOSIS — E039 Hypothyroidism, unspecified: Secondary | ICD-10-CM | POA: Diagnosis not present

## 2014-09-28 DIAGNOSIS — Z5181 Encounter for therapeutic drug level monitoring: Secondary | ICD-10-CM

## 2014-09-28 DIAGNOSIS — G2581 Restless legs syndrome: Secondary | ICD-10-CM

## 2014-09-28 MED ORDER — PRAMIPEXOLE DIHYDROCHLORIDE 0.25 MG PO TABS: ORAL_TABLET | ORAL | Status: DC

## 2014-09-28 MED ORDER — PREGABALIN 75 MG PO CAPS: ORAL_CAPSULE | ORAL | Status: DC

## 2014-09-28 NOTE — Progress Notes (Signed)
Erin Roach was seen today in neurologic consultation at the request of Unice Cobble, MD.  The consultation is for the evaluation of RLS.  This is a second opinion.  The patient has seen Dr. Gwenette Greet and I reviewed his records.  The patient has had restless leg syndrome about 40 years ago, when she was pregnant. Over the years, it came and went but it has been "bad" for the last 20 + years.  She has been on multiple medications.  She has been on Requip, clonazepam, Neupro (site reaction) and Horizant.  She is now on pramipexole, 0.25 mg, 2 tablets at bedtime.   She tried quinine a long time ago and it worked great but it is no longer available for RLS.  She also uses Vicodin for restless leg syndrome.  She takes one at night.  It helps.  Her grandfather and father had similar.    She reports that she has most of the symptoms at night but sometimes during the day.  She has a burning pain in the legs that can get severe.  She always has to move them.  She gets up and walks during the middle of the night.  Her medications seem to be inconsistent.    06/21/13 update:  The patient presents today for restless leg followup.  She did have an EMG since last visit that did not demonstrate any evidence of a large fiber peripheral neuropathy.  She is on pramipexole 0.25 mg, 2 tablets after dinner and 2 tablets at bedtime.  She called me a few days ago because the symptoms were increasing, and I gave her samples of Lyrica.  She tried Lyrica 75 mg twice a day and got just a little bit groggy, so decided to just take it at night, and thus far it is working well.  She does remain on the Vicodin, one tablet at night.  12/19/13 update:  The patient is seen today in followup.  She has a history of restless leg syndrome.  She is on a number of medications for this.  She is on Mirapex 0.25 mg and takes 2 after dinner and 2 at night.  She is also on Lyrica 75 mg 1 after dinner and 2 tablets at night in addition to hydrocodone  at bedtime.  She knows if she does not take the Lyrica after dinner, then she has significant symptoms.  She has never tried not taking the hydrocodone.  She does state that she is having some difficulty sleeping at night, but it is not necessarily related to the restless leg.  She did have a fall and fractured her patella and has been having some difficulty getting back since then.  She had to cancel a cruise because of it.  She is just now able to walk.  She had swelling in the legs.  She states that she has always had some degree of swelling, but not like currently.  She asks me if I would sign a form so that she can do the bicycling program at the [facility].  This is a program that has traditionally been for Parkinson's patients but she talked to the organizer and she was told that she could join if she had a medical release.  09/28/14 update:  The patient returns today for follow-up.  I have not seen her since August of last year.  She is on pramipexole 0.25 mg, 2 tablets after dinner and 2 at bedtime.  She is on Lyrica 20  mg, one tablet after dinner and 2 tablets at bedtime.  She generally takes one tablet of hydrocodone at night.  States that her RLS is under great control and is pleased.  Is having significant issues with her allergies.  Took albuterol right before coming in and now having palpitations.  Has appt with the allergist right after this appt.  She is SOB but does not feel faint.    PREVIOUS MEDICATIONS: n/a  ALLERGIES:  No Known Allergies  CURRENT MEDICATIONS:  Current Outpatient Prescriptions on File Prior to Visit  Medication Sig Dispense Refill  . aspirin 81 MG tablet Take 81 mg by mouth daily.     [name] HYDROcodone-acetaminophen (NORCO/VICODIN) 5-325 MG per tablet TAKE 1 TABLET BY MOUTH AT BEDTIME. OK per [place] to refill every 30 days on schedule- if pt is compliant with OV. 30 tablet 0  . levothyroxine (SYNTHROID, LEVOTHROID) 50 MCG tablet TAKE 1/2 TABLET BY MOUTH DAILY FOR 10 DAYS, THEN 1  TABLET DAILY *FOLLOW-UP LABS IN 10 WEEKS* 90 tablet 0  . mirabegron ER (MYRBETRIQ) 25 MG TB24 tablet Take 2 tablets (50 mg total) by mouth daily. 60 tablet 5  . pramipexole (MIRAPEX) 0.25 MG tablet Take 2 tablet in the afternoon and 2 tablets after dinner. 360 tablet 1  . pregabalin (LYRICA) 75 MG capsule 1 afternoon, 2 evening 270 capsule 1  . SIMCOR 500-20 MG 24 hr tablet TAKE 1 TABLET BY MOUTH AT BEDTIME. 30 tablet 4  . spironolactone (ALDACTONE) 25 MG tablet TAKE 1 TABLET BY MOUTH TWICE DAILY 180 tablet 1  . [DISCONTINUED] VESICARE 10 MG tablet TAKE 1 TABLET EVERY DAY 30 tablet 0   No current facility-administered medications on file prior to visit.    PAST MEDICAL HISTORY:   Past Medical History  Diagnosis Date  . Venous insufficiency     legs  . Leg edema     bilateral  . RLS (restless legs syndrome)   . Esophageal reflux   . Urinary incontinence   . Skin cancer     hx of squamous cell buttocks area, Yevette Edwards MD,Gen Surgery: Amy Martinique MD, derm  . Asthma   . Snoring     no sleep study  . Heart murmur     PAST SURGICAL HISTORY:   Past Surgical History  Procedure Laterality Date  . Appendectomy    . Tonsillectomy    . Skin cancer removal      squamous cell interglureal space  . Ulnar nerve surgery      04/20/13:  pt denies  . Colonoscopy      neg; Dr Collene Mares  . Cystoscopy  2004    ? negative, Dr Jeffie Pollock    SOCIAL HISTORY:   History   Social History  . Marital Status: Married    Spouse Name: N/A  . Number of Children: N/A  . Years of Education: N/A   Occupational History  . retired Pharmacist, hospital    Social History Main Topics  . Smoking status: Former Smoker -- 0.50 packs/day for 15 years    Types: Cigarettes  . Smokeless tobacco: Never Used     Comment: smoked age [age], up to 1 ppd  . Alcohol Use: 1.8 oz/week    3 Glasses of wine per week     Comment: socially  . Drug Use: No  . Sexual Activity: Not on file   Other Topics Concern  . Not on file    Social History Narrative   2 children ages  36,35   Regular exercise no    FAMILY HISTORY:   Family Status  Relation Status Death Age  . Father Deceased 4    rls, esophageal CA  . Mother Alive     healthy  . Brother Alive     healthy  . Sister Alive     healthy  . Child Alive     2, alive and well    ROS:  A complete 10 system review of systems was obtained and was unremarkable apart from what is mentioned above.  PHYSICAL EXAMINATION:    VITALS:   There were no vitals filed for this visit. Wt Readings from Last 3 Encounters:  04/11/14 205 lb (92.987 kg)  12/19/13 204 lb (92.534 kg)  12/04/13 202 lb 3.2 oz (91.717 kg)     GEN:  Normal appears female in no acute distress.  Appears younger than stated age 74:  Normocephalic, atraumatic. The mucous membranes are moist. The superficial temporal arteries are without ropiness or tenderness. Cardiovascular: Tachycardic.  Regular rhythm.  3/6 SEM.  No conversational dyspnea Lungs: Clear to auscultation bilaterally. Neck/Heme: There are no carotid bruits noted bilaterally.   NEUROLOGICAL: Orientation:  The patient is alert and oriented x 3.  Fund of knowledge is appropriate.  Recent and remote memory intact.  Attention span and concentration normal.  Repeats and names without difficulty. Cranial nerves: There is good facial symmetry.  There is mild L ptosis vs pseudoptosis.  The pupils are equal round and reactive to light bilaterally. Fundoscopic exam reveals clear disc margins bilaterally. Extraocular muscles are intact and visual fields are full to confrontational testing. Speech is fluent and clear. Soft palate rises symmetrically and there is no tongue deviation. Hearing is intact to conversational tone. Tone: Tone is good throughout. Sensation: Sensation is intact to light touch throughout Coordination:  The patient has no difficulty with RAM's or FNF bilaterally. Motor: Strength is 5/5 in the bilateral upper and  lower extremities.  Shoulder shrug is equal and symmetric. There is no pronator drift.  There are no fasciculations noted.   Lab Results  Component Value Date   [id number] 009 04/20/2013   Lab Results  Component Value Date   HGBA1C 5.5 08/22/2010     Chemistry      Component Value Date/Time   NA 139 12/04/2013 1724   K 4.3 12/04/2013 1724   CL 108 12/04/2013 1724   CO2 25 12/04/2013 1724   BUN 15 12/04/2013 1724   CREATININE 0.8 12/04/2013 1724      Component Value Date/Time   CALCIUM 9.4 12/04/2013 1724   ALKPHOS 66 12/04/2013 1724   AST 33 12/04/2013 1724   ALT 35 12/04/2013 1724   BILITOT 0.4 12/04/2013 1724     Lab Results  Component Value Date   WBC 6.2 02/08/2012   HGB 12.9 02/08/2012   HCT 38.3 02/08/2012   MCV 87.6 02/08/2012   PLT 203 02/08/2012   Lab Results  Component Value Date   TSH 5.16* 12/04/2013   Lab Results  Component Value Date   IRON 70 04/12/2012   FERRITIN 33.5 04/20/2013      IMPRESSION/PLAN  1. RLS  -The pt has a long hx and a strong fam hx of RLS  -She will remain on the mirapex 0.25 mg - 2 after dinner and 2 at bedtime.  We did talk about the phenomenon of augmentation, specifically associated with these dopamine agonists, but decided to stay on the Mirapex for now.  RX refilled.  -She seems to be doing well with the Lyrica 75 mg, one after dinner and 2 tablets per night and can continue on that.  A prescription was written for that.  -In the future, if we continue to struggle, then we will consider adding Klonopin back again, trying Topamax or even levodopa.  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   2. followup in 8 months.

## 2014-10-01 ENCOUNTER — Telehealth: Payer: Self-pay | Admitting: Pulmonary Disease

## 2014-10-01 MED ORDER — HYDROCODONE-ACETAMINOPHEN 5-325 MG PO TABS: ORAL_TABLET | ORAL | Status: DC

## 2014-10-01 NOTE — Telephone Encounter (Signed)
Called spoke with pt. Aware RX ready for pick up. Per kc can set her yearly appt with VS Pt aware but reports she thinks she wants to see RA. She is aware she needs yearly f/u. Nothing further needed

## 2014-10-16 ENCOUNTER — Telehealth: Payer: Self-pay | Admitting: Neurology

## 2014-10-16 NOTE — Telephone Encounter (Signed)
LMOM for patient to call back to reminder her she needs to have labs drawn. Will have to let her know we are now utilizing [facility] and she will need to come to our office to schedule the lab appt prior to going to the lab. Awaiting call back.

## 2014-10-16 NOTE — Addendum Note (Signed)
Addended byAnnamaria Helling on: 10/16/2014 03:30 PM   Modules accepted: Orders

## 2014-10-16 NOTE — Telephone Encounter (Signed)
Pt returning call to [place] / Sherri S.

## 2014-10-16 NOTE — Telephone Encounter (Signed)
-----   Message from [place], DO sent at 10/16/2014 12:56 PM EDT ----- Please remind pt to get labs done (do we need to put in new order now that [facility] no longer here)  ----- Message -----    From: SYSTEM    Sent: 10/03/2014  12:04 AM      To: Eustace Quail Tat, DO

## 2014-10-16 NOTE — Telephone Encounter (Signed)
Spoke with patient and reminder her about labs. She states she will go tomorrow. Labs re-ordered for [facility]. She knows to come by our office to make an appt first.

## 2014-10-18 ENCOUNTER — Other Ambulatory Visit (INDEPENDENT_AMBULATORY_CARE_PROVIDER_SITE_OTHER): Payer: Medicare Other

## 2014-10-18 DIAGNOSIS — G2581 Restless legs syndrome: Secondary | ICD-10-CM | POA: Diagnosis not present

## 2014-10-18 DIAGNOSIS — Z5181 Encounter for therapeutic drug level monitoring: Secondary | ICD-10-CM

## 2014-10-18 DIAGNOSIS — E039 Hypothyroidism, unspecified: Secondary | ICD-10-CM

## 2014-10-18 LAB — COMPREHENSIVE METABOLIC PANEL
ALT: 20 U/L (ref 0–35)
AST: 15 U/L (ref 0–37)
Albumin: 4 g/dL (ref 3.5–5.2)
Alkaline Phosphatase: 67 U/L (ref 39–117)
BUN: 10 mg/dL (ref 6–23)
CO2: 26 mEq/L (ref 19–32)
CREATININE: 0.68 mg/dL (ref 0.40–1.20)
Calcium: 9 mg/dL (ref 8.4–10.5)
Chloride: 106 mEq/L (ref 96–112)
GFR: 90.46 mL/min (ref >60.00)
Glucose, Bld: 103 mg/dL — ABNORMAL HIGH (ref 70–99)
Potassium: 4.5 mEq/L (ref 3.5–5.1)
Sodium: 138 mEq/L (ref 135–145)
Total Bilirubin: 0.4 mg/dL (ref 0.2–1.2)
Total Protein: 6.8 g/dL (ref 6.0–8.3)

## 2014-10-18 LAB — CBC WITH DIFFERENTIAL/PLATELET
BASOS ABS: 0 10*3/uL (ref 0.0–0.1)
BASOS PCT: 0.6 % (ref 0.0–3.0)
EOS ABS: 0.2 10*3/uL (ref 0.0–0.7)
Eosinophils Relative: 3.1 % (ref 0.0–5.0)
HCT: 41.8 % (ref 36.0–46.0)
HEMOGLOBIN: 14 g/dL (ref 12.0–15.0)
Lymphocytes Relative: 26.5 % (ref 12.0–46.0)
Lymphs Abs: 2.1 10*3/uL (ref 0.7–4.0)
MCHC: 33.6 g/dL (ref 30.0–36.0)
MCV: 88.7 fl (ref 78.0–100.0)
MONOS PCT: 8.2 % (ref 3.0–12.0)
Monocytes Absolute: 0.6 10*3/uL (ref 0.1–1.0)
NEUTROS ABS: 4.8 10*3/uL (ref 1.4–7.7)
NEUTROS PCT: 61.6 % (ref 43.0–77.0)
PLATELETS: 250 10*3/uL (ref 150.0–400.0)
RBC: 4.71 Mil/uL (ref 3.87–5.11)
RDW: 13.9 % (ref 11.5–15.5)
WBC: 7.8 10*3/uL (ref 4.0–10.5)

## 2014-10-18 LAB — TSH: TSH: 3.23 u[IU]/mL (ref 0.35–4.50)

## 2014-10-31 ENCOUNTER — Telehealth: Payer: Self-pay | Admitting: Pulmonary Disease

## 2014-10-31 MED ORDER — HYDROCODONE-ACETAMINOPHEN 5-325 MG PO TABS: ORAL_TABLET | ORAL | Status: DC

## 2014-10-31 NOTE — Telephone Encounter (Signed)
Rx has been printed and signed by PW. It has been placed up front for pick up. Nothing further was needed.

## 2014-10-31 NOTE — Telephone Encounter (Signed)
Spoke with pt, requesting refill on norco 5/325.  Per [place] she is to take 1 qhs to help with Restless Leg Syndrome.   Last ov: 04/11/1014-told to follow up in 1 year.   Last refill 10/01/14 #30 with 0 refills.   Pt requests to pick this up in office.    Sending to doc of day in [facility] absence.  Dr. Joya Gaskins please advise if you're ok with signing this for pt.  Thanks!

## 2014-10-31 NOTE — Telephone Encounter (Signed)
Ok to refill 

## 2014-11-28 ENCOUNTER — Telehealth: Payer: Self-pay | Admitting: Internal Medicine

## 2014-11-28 MED ORDER — HYDROCODONE-ACETAMINOPHEN 5-325 MG PO TABS
ORAL_TABLET | ORAL | Status: DC
Start: 2014-11-28 — End: 2015-01-09

## 2014-11-28 NOTE — Telephone Encounter (Signed)
Ok to refill as Dr Gwenette Greet had done

## 2014-11-28 NOTE — Telephone Encounter (Signed)
Pt aware of refill.  Will pick this up today from the office.  Nothing further needed.

## 2014-11-28 NOTE — Telephone Encounter (Signed)
Former [place] pt last seen 04/11/14 Has pending appt with Dr. Annamaria Boots on 04/16/15 Pt received norco 5-325mg  for her RLS Last refilled 10/31/14 #30 x 0 refills  TAKE 1 TABLET BY MOUTH AT BEDTIME. OK per [place] to refill every 30 days on schedule- if pt is compliant with OV  Please advise Dr. Annamaria Boots on refill? thanks

## 2014-12-14 ENCOUNTER — Other Ambulatory Visit: Payer: Self-pay | Admitting: Internal Medicine

## 2014-12-31 ENCOUNTER — Telehealth: Payer: Self-pay | Admitting: Internal Medicine

## 2014-12-31 ENCOUNTER — Other Ambulatory Visit: Payer: Self-pay | Admitting: Internal Medicine

## 2014-12-31 NOTE — Telephone Encounter (Signed)
Pt is the wife of Carlis Burnsworth, a pt of yours.  He states he spoke w/ you about taking his wife as a pt.(as you did him from Dr Linna Darner) But you all agreed to wait until Dr Linna Darner was completely retiring.  He hopes you can recall the conversation and see this pt. (his wife)

## 2015-01-01 NOTE — Telephone Encounter (Signed)
I would love to see her but with my HUGE panel (and transitions in nursing for me in next two months) I feel it would be best to try to get her in to see one of the providers still building their panels.  I do my best to take other family members but with a panel of almost 3,000 literally every day someone asks me to take on another patient.

## 2015-01-04 ENCOUNTER — Telehealth: Payer: Self-pay | Admitting: Emergency Medicine

## 2015-01-04 NOTE — Telephone Encounter (Signed)
Request from pharm. Simcor 500/20 mg has been discontinued, can they fill niacin ER 500mg  and Simvastatin 20mg  or is there an alternative. Please advise?

## 2015-01-04 NOTE — Telephone Encounter (Signed)
Needs OV with her formulary

## 2015-01-06 ENCOUNTER — Other Ambulatory Visit: Payer: Self-pay | Admitting: Internal Medicine

## 2015-01-07 NOTE — Telephone Encounter (Signed)
Spoke with Pt's husband. He stated that she would not be home until this afternoon. He will let her know to call back in to schedule an office visit for Medication change.

## 2015-01-08 ENCOUNTER — Ambulatory Visit (INDEPENDENT_AMBULATORY_CARE_PROVIDER_SITE_OTHER): Payer: Medicare Other | Admitting: Internal Medicine

## 2015-01-08 ENCOUNTER — Encounter: Payer: Self-pay | Admitting: Internal Medicine

## 2015-01-08 ENCOUNTER — Other Ambulatory Visit (INDEPENDENT_AMBULATORY_CARE_PROVIDER_SITE_OTHER): Payer: Medicare Other

## 2015-01-08 VITALS — BP 138/84 | HR 94 | Temp 98.9°F | Resp 18 | Ht 66.5 in | Wt 194.0 lb

## 2015-01-08 DIAGNOSIS — R739 Hyperglycemia, unspecified: Secondary | ICD-10-CM

## 2015-01-08 DIAGNOSIS — G2581 Restless legs syndrome: Secondary | ICD-10-CM | POA: Diagnosis not present

## 2015-01-08 DIAGNOSIS — E785 Hyperlipidemia, unspecified: Secondary | ICD-10-CM

## 2015-01-08 LAB — HEMOGLOBIN A1C: Hgb A1c MFr Bld: 5.2 % (ref 4.6–6.5)

## 2015-01-08 NOTE — Progress Notes (Signed)
Pre visit review using our clinic review tool, if applicable. No additional management support is needed unless otherwise documented below in the visit note. 

## 2015-01-08 NOTE — Patient Instructions (Signed)
Please follow a Mediaterranean type diet  (many good cook books readily available) or review Dr Nunzio Cory book Eat, [place] for best  dietary cholesterol information & options.   Cardiovascular exercise, this can be as simple a program as walking, is recommended 30-45 minutes 3-4 times per week. If you're not exercising you should take 6-8 weeks to build up to this level.   Fasting  cholesterol panel after 10 weeks of new medication,exercise changes,and nutrition interventions.

## 2015-01-08 NOTE — Progress Notes (Signed)
   Subjective:    Patient ID: Erin Roach, female    DOB: Sep 27, 1940, 74 y.o.   MRN: [id number]  HPI The patient is here to assess status of active health conditions.  PMH, FH, & Social History reviewed & updated.No change in [place] as recorded.  She is on a heart healthy diet. She walks 2 times per week for 20-30 minutes without cardio pulmonary symptoms.  She's been compliant with her medicines without adverse effects. Her insurance no longer pays for [employer]. BP @ home 125/?.  She is on a narcotic pain medicine for restless leg syndromes. She typically goes to sleep @ 10-11 PM and awakens 2-3 AM. She will get a total 5-5.5 hours per night.  She has chronic edema of the lower legs. She has a nonproductive cough.  Colonoscopy was completed in January 2015 by Dr Collene Mares and was negative. She has no active GI symptoms. Her fasting blood glucose was 103 on 10/18/14.       Review of Systems  Chest pain, palpitations, tachycardia, exertional dyspnea, paroxysmal nocturnal dyspnea, claudication or edema are absent. No unexplained weight loss; she is losing weight purposefully.Weight down 12#.  No abdominal pain, significant dyspepsia, dysphagia, melena, rectal bleeding, or persistently small caliber stools. Dysuria, pyuria, hematuria, frequency, nocturia or polyuria are denied. Change in hair, skin, nails denied. No bowel changes of constipation or diarrhea. No intolerance to heat or cold.      Objective:   Physical Exam Pertinent or positive findings include: She has slight ptosis bilaterally. There is a faint grade 1/2 systolic murmur at the base. There is also a faint left carotid bruit. Degenerative reflexes are 0+ at the knees. She has 1+ edema of the lower extremity as. Varicosities and spiders are noted over the lower extremities.   General appearance :adequately nourished; in no distress.  Eyes: No conjunctival inflammation or scleral icterus is present.  Oral exam:  Lips and  gums are healthy appearing.There is no oropharyngeal erythema or exudate noted. Dental hygiene is good.  Heart:  Normal rate and regular rhythm. S1 and S2 normal without gallop,  click, rub or other extra sounds    Lungs:Chest clear to auscultation; no wheezes, rhonchi,rales ,or rubs present.No increased work of breathing.   Abdomen: bowel sounds normal, soft and non-tender without masses, organomegaly or hernias noted.  No guarding or rebound. No flank tenderness to percussion.  Vascular : all pulses equal .  Skin:Warm & dry.  Intact without suspicious lesions or rashes ; no tenting or jaundice   Lymphatic: No lymphadenopathy is noted about the head, neck, axilla.   Neuro: Strength, tone  normal.        Assessment & Plan:  See Current Assessment & Plan in Problem List under specific Diagnosis

## 2015-01-09 ENCOUNTER — Telehealth: Payer: Self-pay | Admitting: Internal Medicine

## 2015-01-09 DIAGNOSIS — R739 Hyperglycemia, unspecified: Secondary | ICD-10-CM | POA: Insufficient documentation

## 2015-01-09 MED ORDER — HYDROCODONE-ACETAMINOPHEN 5-325 MG PO TABS: ORAL_TABLET | ORAL | Status: DC

## 2015-01-09 MED ORDER — SIMVASTATIN 20 MG PO TABS: 20.0000 mg | ORAL_TABLET | Freq: Every day | ORAL | Status: DC

## 2015-01-09 NOTE — Telephone Encounter (Signed)
Called pt and informed that rx had been signed and ready for pickup Pt stated that she would come by today to pick up  Nothing further is needed

## 2015-01-09 NOTE — Assessment & Plan Note (Signed)
LFTs WNL in 6/16 D/C Simcor; start Simvastatin 20 mg Fasting lipids, CK after 10 weeks

## 2015-01-09 NOTE — Assessment & Plan Note (Signed)
A1c

## 2015-01-09 NOTE — Telephone Encounter (Signed)
RX placed on CY cart for signature Will route message to Joellen [place] to follow up on Patient does want call back once rx is signed

## 2015-01-09 NOTE — Telephone Encounter (Signed)
Ok to refill as requested 

## 2015-01-09 NOTE — Assessment & Plan Note (Signed)
Check CK in 10 weeks

## 2015-01-09 NOTE — Telephone Encounter (Signed)
Pt called and informed of refill and that she would need to come by the office to pick up rx Informed pt that CY was not in the office right now to sign RX but as soon as he did pt would be contacted Will leave message opened to make sure follow up on rx is done per pt request

## 2015-01-09 NOTE — Telephone Encounter (Signed)
Pt last seen by [facility] on 04/11/14 and given the following instructions:  Patient Instructions     Continue on your medications for restless legs, but can experiment with the dosing of lyrica to see if it really helps. Try to exercise everyday if possible.  followup with me again in one year, and we will continue to fill your hydrocodone.    She has pending ov to est care with [facility] on 04/16/15   Last hydrocodone rx given 11/28/14 5-325 mg # 30 1 qhs with no refills  Please advise if okay to refill, thanks!

## 2015-01-12 ENCOUNTER — Other Ambulatory Visit: Payer: Self-pay | Admitting: Internal Medicine

## 2015-01-15 ENCOUNTER — Other Ambulatory Visit: Payer: Self-pay | Admitting: Emergency Medicine

## 2015-01-16 ENCOUNTER — Other Ambulatory Visit: Payer: Self-pay | Admitting: Emergency Medicine

## 2015-01-16 MED ORDER — SPIRONOLACTONE 25 MG PO TABS: 25.0000 mg | ORAL_TABLET | Freq: Two times a day (BID) | ORAL | Status: DC

## 2015-02-01 ENCOUNTER — Telehealth: Payer: Self-pay | Admitting: *Deleted

## 2015-02-01 MED ORDER — ZOLPIDEM TARTRATE ER 6.25 MG PO TBCR: 6.2500 mg | EXTENDED_RELEASE_TABLET | Freq: Every evening | ORAL | Status: DC | PRN

## 2015-02-01 NOTE — Telephone Encounter (Signed)
Left msg on triage stating md rx zolpidem 6.25 mg back in 2013. She is wanting to know if md can give another prescription she is having trouble sleeping at night. Pls advise...Johny Chess

## 2015-02-01 NOTE — Telephone Encounter (Signed)
Notified pt md ok zolpidem verified pharmacy faxing script to [facility]...Erin Roach

## 2015-02-01 NOTE — Telephone Encounter (Signed)
#  10 1 every 3rd night prn only To prevent sleep dysfunction follow these instructions for sleep hygiene. Do not read, watch TV, or eat in bed. Do not get into bed until you are ready to turn off the light &  to go to sleep. Do not ingest stimulants ( decongestants, diet pills, nicotine, caffeine) after the evening meal.Do not take daytime naps.Cardiovascular exercise, this can be as simple a program as walking, is recommended 30-45 minutes 3-4 times per week. If you're not exercising you should take 6-8 weeks to build up to this level. If sleep issues progress; Sleep Specialty evaluation recommended to define exact cause

## 2015-02-06 ENCOUNTER — Other Ambulatory Visit: Payer: Self-pay | Admitting: Internal Medicine

## 2015-02-07 ENCOUNTER — Other Ambulatory Visit: Payer: Self-pay | Admitting: Emergency Medicine

## 2015-02-07 ENCOUNTER — Telehealth: Payer: Self-pay | Admitting: Pulmonary Disease

## 2015-02-07 MED ORDER — MIRABEGRON ER 25 MG PO TB24: 50.0000 mg | ORAL_TABLET | Freq: Every day | ORAL | Status: DC

## 2015-02-07 MED ORDER — HYDROCODONE-ACETAMINOPHEN 5-325 MG PO TABS: ORAL_TABLET | ORAL | Status: DC

## 2015-02-07 NOTE — Telephone Encounter (Signed)
Script printed and signed by Dr Solon August[place] pt to inform of rx and needing appt for additional refills Left message for pt to call back   Script placed up front with pt's name for pick up   Will keep message in triage to ensure that pt makes appt with sleep dr

## 2015-02-07 NOTE — Telephone Encounter (Signed)
Pt returned call and let her know that rx was ready and that she would need appoint for any further refills.Erin Roach

## 2015-02-07 NOTE — Telephone Encounter (Signed)
Pt is requesting refill on norco 5-325 mg tab given to her by [facility] for RLS. Last refilled by Dr. Annamaria Boots on 01/09/15 #30 x 0 refills TAKE 1 TABLET BY MOUTH AT BEDTIME  Pt last seen 04/11/14 by [facility] No pending appt.  Please advise thanks

## 2015-02-07 NOTE — Telephone Encounter (Signed)
Ok to refill. She needs to make a definite appointment with any one of our sleep docs to continue getting this drug from [name], or she needs to get it from her PCP.

## 2015-02-07 NOTE — Telephone Encounter (Signed)
Spoke with the pt and scheduled f/u with TP for 04/29/15 at 2 pm Nothing further needed

## 2015-02-27 ENCOUNTER — Ambulatory Visit (INDEPENDENT_AMBULATORY_CARE_PROVIDER_SITE_OTHER): Payer: Medicare Other | Admitting: Pulmonary Disease

## 2015-02-27 ENCOUNTER — Encounter: Payer: Self-pay | Admitting: Pulmonary Disease

## 2015-02-27 VITALS — BP 142/84 | HR 89 | Ht 66.0 in | Wt 201.6 lb

## 2015-02-27 DIAGNOSIS — G2581 Restless legs syndrome: Secondary | ICD-10-CM

## 2015-02-27 DIAGNOSIS — R030 Elevated blood-pressure reading, without diagnosis of hypertension: Secondary | ICD-10-CM | POA: Diagnosis not present

## 2015-02-27 MED ORDER — CLONAZEPAM 0.5 MG PO TABS: 0.5000 mg | ORAL_TABLET | Freq: Every day | ORAL | Status: DC

## 2015-02-27 MED ORDER — HYDROCODONE-ACETAMINOPHEN 5-325 MG PO TABS: 1.0000 | ORAL_TABLET | Freq: Four times a day (QID) | ORAL | Status: DC | PRN

## 2015-02-27 NOTE — Assessment & Plan Note (Addendum)
Trial of klonopin 0.5 mg at bedtime thrice/week #30 -use instead of ambien. We discussed long-term dependency issues. Refill on vicodin ok x 2 Would check iron saturation levels with her next blood draw  There does not appear to be any evidence for augmentation. These prescriptions can be written by her PCP or by neurology or she can continue seeing [name] twice a year for these prescriptions to be renewed.

## 2015-02-27 NOTE — Patient Instructions (Signed)
Trial of klonopin 0.5 mg at bedtime thrice/week #30 Refill on vicodin ok x 2

## 2015-02-27 NOTE — Progress Notes (Signed)
   Subjective:    Patient ID: Erin Roach, female    DOB: 1940-08-07, 74 y.o.   MRN: [id number]  HPI   Chief Complaint  Patient presents with  . Follow-up    Former [place] pt; seen for RLS; taking lyrica, hydrocodone, Mirapex.  Still having a lot of problems with a burning sensation in back of knees.     Former [place] pt, for annual  follow-up of severe restless leg syndrome. Onset and are 30s after pregnancy She also sees neurology. She takes Mirapex, Lyrica, and hydrocodone at bedtime in order to control her symptoms  She has been on Requip, clonazepam, Neupro (site reaction) and Horizant in the past  EMG neg  C/o burning behind knee Warm bath, magnesium etc provide limited help Started on ambien by PCP -used 4/last month-seems to sleep deeper with this  Past Medical History  Diagnosis Date  . Venous insufficiency     legs  . Leg edema     bilateral  . RLS (restless legs syndrome)   . Esophageal reflux   . Urinary incontinence   . Skin cancer     hx of squamous cell buttocks area, Yevette Edwards MD,Gen Surgery: Amy Martinique MD, derm  . Asthma   . Snoring     no sleep study  . Heart murmur      Review of Systems neg for any significant sore throat, dysphagia, itching, sneezing, nasal congestion or excess/ purulent secretions, fever, chills, sweats, unintended wt loss, pleuritic or exertional cp, hempoptysis, orthopnea pnd or change in chronic leg swelling. Also denies presyncope, palpitations, heartburn, abdominal pain, nausea, vomiting, diarrhea or change in bowel or urinary habits, dysuria,hematuria, rash, arthralgias, visual complaints, headache, numbness weakness or ataxia.     Objective:   Physical Exam  Gen. Pleasant, well-nourished, in no distress, normal affect ENT - no lesions, no post nasal drip Neck: No JVD, no thyromegaly, no carotid bruits Lungs: no use of accessory muscles, no dullness to percussion, clear without rales or rhonchi  Cardiovascular: Rhythm  regular, heart sounds  normal, no murmurs or gallops, no peripheral edema Abdomen: soft and non-tender, no hepatosplenomegaly, BS normal. Musculoskeletal: No deformities, no cyanosis or clubbing Neuro:  alert, non focal       Assessment & Plan:

## 2015-02-27 NOTE — Assessment & Plan Note (Signed)
Systolic slightly high today She will follow up with PCP

## 2015-02-27 NOTE — Addendum Note (Signed)
Addended by: Mathis Dad on: 02/27/2015 04:54 PM   Modules accepted: Orders

## 2015-03-19 ENCOUNTER — Other Ambulatory Visit (INDEPENDENT_AMBULATORY_CARE_PROVIDER_SITE_OTHER): Payer: Medicare Other

## 2015-03-19 DIAGNOSIS — G2581 Restless legs syndrome: Secondary | ICD-10-CM

## 2015-03-19 DIAGNOSIS — R030 Elevated blood-pressure reading, without diagnosis of hypertension: Secondary | ICD-10-CM

## 2015-03-19 DIAGNOSIS — E785 Hyperlipidemia, unspecified: Secondary | ICD-10-CM

## 2015-03-19 LAB — LIPID PANEL
CHOLESTEROL: 212 mg/dL — AB (ref 0–200)
HDL: 58.8 mg/dL (ref >39.00)
LDL Cholesterol: 126 mg/dL — ABNORMAL HIGH (ref 0–99)
NonHDL: 153.38
TRIGLYCERIDES: 136 mg/dL (ref 0.0–149.0)
Total CHOL/HDL Ratio: 4
VLDL: 27.2 mg/dL (ref 0.0–40.0)

## 2015-03-19 LAB — CK: Total CK: 26 U/L (ref 7–177)

## 2015-03-19 LAB — IRON: Iron: 46 ug/dL (ref 42–145)

## 2015-03-19 LAB — FERRITIN: FERRITIN: 47.1 ng/mL (ref 10.0–291.0)

## 2015-03-20 LAB — IRON AND TIBC
%SAT: 15 % (ref 11–50)
IRON: 49 ug/dL (ref 45–160)
TIBC: 333 ug/dL (ref 250–450)
UIBC: 284 ug/dL (ref 125–400)

## 2015-03-21 ENCOUNTER — Encounter: Payer: Self-pay | Admitting: Internal Medicine

## 2015-03-21 ENCOUNTER — Telehealth: Payer: Self-pay | Admitting: Emergency Medicine

## 2015-03-21 ENCOUNTER — Ambulatory Visit (INDEPENDENT_AMBULATORY_CARE_PROVIDER_SITE_OTHER): Payer: Medicare Other | Admitting: Internal Medicine

## 2015-03-21 VITALS — BP 128/78 | HR 83 | Temp 98.7°F | Resp 20 | Ht 66.0 in | Wt 201.0 lb

## 2015-03-21 DIAGNOSIS — M25562 Pain in left knee: Secondary | ICD-10-CM

## 2015-03-21 DIAGNOSIS — E785 Hyperlipidemia, unspecified: Secondary | ICD-10-CM | POA: Diagnosis not present

## 2015-03-21 DIAGNOSIS — Z23 Encounter for immunization: Secondary | ICD-10-CM | POA: Diagnosis not present

## 2015-03-21 DIAGNOSIS — R739 Hyperglycemia, unspecified: Secondary | ICD-10-CM | POA: Diagnosis not present

## 2015-03-21 DIAGNOSIS — J4520 Mild intermittent asthma, uncomplicated: Secondary | ICD-10-CM

## 2015-03-21 NOTE — Progress Notes (Signed)
   Subjective:    Patient ID: Erin Roach, female    DOB: [birth year], 74 y.o.   MRN: [id number]  HPI The patient is here to assess status of active health conditions in the context of preoperative clearance evaluation for total knee-medial and lateral with/without patella resurfacing to be done 04/09/15 by Dr. Alvan Dame.  Prior to an acute episode 2 weeks ago she had only mild  knee issues. The knee gave way acutely associated with significant pain. She saw Dr. Alvan Dame who gave her an intra-articular steroid shot with good response.  Prior to that episode she had been excising 3 times a week for 30 minutes as walking or biking without significant musculo skeletal issues and no cardiovascular symptoms. At that time pain would be up to 2-3.  Acutely after the episode she was walking with a cane which she's continued to do.  Dr. Alvan Dame gave her a choice of arthroplasty to fix the meniscus vs total knee replacement. She's opted for the latter.  She had been on narcotic pain medication and had been drinking increased amounts of fluids to prevent constipation. She was drinking up to 8-10 glasses. She denied associated polyuria or polyphagia. In June of this year glucose is 103. A1c was 5.2% on 12/22/14.  PMH, FH, & Social History reviewed & updated.No change in [place] as recorded. There is no family history of premature coronary disease or heart attack. Her labs dating back to June were reviewed.  HDL was 58.8 and LDL 126. LDL goal is less than 115, ideally less than 85.  She has no past history of anesthetic or perioperative complications. There is no history of clotting disorders. Her reactive airways disease is stable.  Review of Systems  Chest pain, palpitations, tachycardia, exertional dyspnea, paroxysmal nocturnal dyspnea, claudication or edema are absent. No unexplained weight loss, abdominal pain, significant dyspepsia, dysphagia, melena, rectal bleeding, or persistently small caliber stools. Dysuria,  pyuria, hematuria, frequency, nocturia or polyuria are denied. Change in hair, skin, nails denied. No bowel changes of constipation or diarrhea. No intolerance to heat or cold.     Objective:   Physical Exam  Pertinent or positive findings include: She has bilateral ptosis. Pedal pulses are slightly decreased. 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 There is plethora of the feet with dependency. She has 1+ pitting edema. There is fusiform change of the left knee with crepitus with range of motion.  General appearance :adequately nourished; in no distress.  Eyes: No conjunctival inflammation or scleral icterus is present.  Oral exam:  Lips and gums are healthy appearing.There is no oropharyngeal erythema or exudate noted. Dental hygiene is good.  Heart:  Normal rate and regular rhythm. S1 and S2 normal without gallop, murmur, click, rub or other extra sounds    Lungs:Chest clear to auscultation; no wheezes, rhonchi,rales ,or rubs present.No increased work of breathing.   Abdomen: bowel sounds normal, soft and non-tender without masses, organomegaly or hernias noted.  No guarding or rebound. No flank tenderness to percussion.  Vascular : all pulses equal ; no bruits present.  Skin:Warm & dry.  Intact without suspicious lesions or rashes ; no tenting or jaundice   Lymphatic: No lymphadenopathy is noted about the head, neck, axilla.   Neuro: Strength, tone  normal.      Assessment & Plan:  #1 acute left knee pain; no contraindication to orthopedic surgery. #2 see extended diagnoses in the problem list  See orders

## 2015-03-21 NOTE — Telephone Encounter (Signed)
Pre op paper work faxed to [employer].

## 2015-03-21 NOTE — Progress Notes (Signed)
Pre visit review using our clinic review tool, if applicable. No additional management support is needed unless otherwise documented below in the visit note. 

## 2015-03-26 NOTE — Patient Instructions (Addendum)
Erin Roach  03/26/2015   Your procedure is scheduled on: 04/09/2015    Report to [facility] Main  Entrance take [facility]  elevators to 3rd floor to  [place] at    210pm  Call this number if you have problems the morning of surgery [phone]   Remember: ONLY 1 PERSON MAY GO WITH YOU TO SHORT STAY TO GET  READY MORNING OF YOUR SURGERY.  Do not eat food after midnite.  May have clear liquids from 12 midnite until 1000am morning of surgery then nothing by mouth.      Take these medicines the morning of surgery with A SIP OF WATER:  , Advair Inhaler if needed and bring, Oxycodone or hydrocodone  if needed, Lyrica                                 You may not have any metal on your body including hair pins and              piercings  Do not wear jewelry, make-up, lotions, powders or perfumes, deodorant             Do not wear nail polish.  Do not shave  48 hours prior to surgery.                Do not bring valuables to the hospital. [place].  Contacts, dentures or bridgework may not be worn into surgery.  Leave suitcase in the car. After surgery it may be brought to your room.       Special Instructions: coughing and deep breathing exercises, leg exercises    CLEAR LIQUID DIET   Foods Allowed                                                                     Foods Excluded  Coffee and tea, regular and decaf                             liquids that you cannot  Plain Jell-O in any flavor                                             see through such as: Fruit ices (not with fruit pulp)                                     milk, soups, orange juice  Iced Popsicles                                    All solid food Carbonated beverages, regular and diet  Cranberry, grape and apple juices Sports drinks like Gatorade Lightly seasoned clear broth or consume(fat  free) Sugar, honey syrup  Sample Menu Breakfast                                Lunch                                     Supper Cranberry juice                    Beef broth                            Chicken broth Jell-O                                     Grape juice                           Apple juice Coffee or tea                        Jell-O                                      Popsicle                                                Coffee or tea                        Coffee or tea  _____________________________________________________________________                Please read over the following fact sheets you were given: _____________________________________________________________________             [facility] - Preparing for Surgery Before surgery, you can play an important role.  Because skin is not sterile, your skin needs to be as free of germs as possible.  You can reduce the number of germs on your skin by washing with CHG (chlorahexidine gluconate) soap before surgery.  CHG is an antiseptic cleaner which kills germs and bonds with the skin to continue killing germs even after washing. Please DO NOT use if you have an allergy to CHG or antibacterial soaps.  If your skin becomes reddened/irritated stop using the CHG and inform your nurse when you arrive at Short Stay. Do not shave (including legs and underarms) for at least 48 hours prior to the first CHG shower.  You may shave your face/neck. Please follow these instructions carefully:  1.  Shower with CHG Soap the night before surgery and the  morning of Surgery.  2.  If you choose to wash your hair, wash your hair first as usual with your  normal  shampoo.  3.  After you shampoo, rinse your hair and body thoroughly to remove the  shampoo.  4.  Use CHG as you would any other liquid soap.  You can apply chg directly  to the skin and wash                       Gently with a scrungie or clean  washcloth.  5.  Apply the CHG Soap to your body ONLY FROM THE NECK DOWN.   Do not use on face/ open                           Wound or open sores. Avoid contact with eyes, ears mouth and genitals (private parts).                       Wash face,  Genitals (private parts) with your normal soap.             6.  Wash thoroughly, paying special attention to the area where your surgery  will be performed.  7.  Thoroughly rinse your body with warm water from the neck down.  8.  DO NOT shower/wash with your normal soap after using and rinsing off  the CHG Soap.                9.  Pat yourself dry with a clean towel.            10.  Wear clean pajamas.            11.  Place clean sheets on your bed the night of your first shower and do not  sleep with pets. Day of Surgery : Do not apply any lotions/deodorants the morning of surgery.  Please wear clean clothes to the hospital/surgery center.  FAILURE TO FOLLOW THESE INSTRUCTIONS MAY RESULT IN THE CANCELLATION OF YOUR SURGERY PATIENT SIGNATURE_________________________________  NURSE SIGNATURE__________________________________  ________________________________________________________________________  WHAT IS A BLOOD TRANSFUSION? Blood Transfusion Information  A transfusion is the replacement of blood or some of its parts. Blood is made up of multiple cells which provide different functions.  Red blood cells carry oxygen and are used for blood loss replacement.  White blood cells fight against infection.  Platelets control bleeding.  Plasma helps clot blood.  Other blood products are available for specialized needs, such as hemophilia or other clotting disorders. BEFORE THE TRANSFUSION  Who gives blood for transfusions?   Healthy volunteers who are fully evaluated to make sure their blood is safe. This is blood bank blood. Transfusion therapy is the safest it has ever been in the practice of medicine. Before blood is taken from a donor, a  complete history is taken to make sure that person has no history of diseases nor engages in risky social behavior (examples are intravenous drug use or sexual activity with multiple partners). The donor's travel history is screened to minimize risk of transmitting infections, such as malaria. The donated blood is tested for signs of infectious diseases, such as HIV and hepatitis. The blood is then tested to be sure it is compatible with you in order to minimize the chance of a transfusion reaction. If you or a relative donates blood, this is often done in anticipation of surgery and is not appropriate for emergency situations. It takes many days to process the donated blood. RISKS AND COMPLICATIONS Although transfusion therapy is very safe and saves many lives, the main dangers of transfusion include:  1. Getting an infectious disease. 2. Developing a transfusion reaction.  This is an allergic reaction to something in the blood you were given. Every precaution is taken to prevent this. The decision to have a blood transfusion has been considered carefully by your caregiver before blood is given. Blood is not given unless the benefits outweigh the risks. AFTER THE TRANSFUSION  Right after receiving a blood transfusion, you will usually feel much better and more energetic. This is especially true if your red blood cells have gotten low (anemic). The transfusion raises the level of the red blood cells which carry oxygen, and this usually causes an energy increase.  The nurse administering the transfusion will monitor you carefully for complications. HOME CARE INSTRUCTIONS  No special instructions are needed after a transfusion. You may find your energy is better. Speak with your caregiver about any limitations on activity for underlying diseases you may have. SEEK MEDICAL CARE IF:   Your condition is not improving after your transfusion.  You develop redness or irritation at the intravenous (IV)  site. SEEK IMMEDIATE MEDICAL CARE IF:  Any of the following symptoms occur over the next 12 hours:  Shaking chills.  You have a temperature by mouth above 102 F (38.9 C), not controlled by medicine.  Chest, back, or muscle pain.  People around you feel you are not acting correctly or are confused.  Shortness of breath or difficulty breathing.  Dizziness and fainting.  You get a rash or develop hives.  You have a decrease in urine output.  Your urine turns a dark color or changes to pink, red, or brown. Any of the following symptoms occur over the next 10 days:  You have a temperature by mouth above 102 F (38.9 C), not controlled by medicine.  Shortness of breath.  Weakness after normal activity.  The white part of the eye turns yellow (jaundice).  You have a decrease in the amount of urine or are urinating less often.  Your urine turns a dark color or changes to pink, red, or brown. Document Released: 04/24/2000 Document Revised: 07/20/2011 Document Reviewed: 12/12/2007 ExitCare Patient Information 2014 [place].  _______________________________________________________________________  Incentive Spirometer  An incentive spirometer is a tool that can help keep your lungs clear and active. This tool measures how well you are filling your lungs with each breath. Taking long deep breaths may help reverse or decrease the chance of developing breathing (pulmonary) problems (especially infection) following:  A long period of time when you are unable to move or be active. BEFORE THE PROCEDURE   If the spirometer includes an indicator to show your best effort, your nurse or respiratory therapist will set it to a desired goal.  If possible, sit up straight or lean slightly forward. Try not to slouch.  Hold the incentive spirometer in an upright position. INSTRUCTIONS FOR USE  3. Sit on the edge of your bed if possible, or sit up as far as you can in bed or on a  chair. 4. Hold the incentive spirometer in an upright position. 5. Breathe out normally. 6. Place the mouthpiece in your mouth and seal your lips tightly around it. 7. Breathe in slowly and as deeply as possible, raising the piston or the ball toward the top of the column. 8. Hold your breath for 3-5 seconds or for as long as possible. Allow the piston or ball to fall to the bottom of the column. 9. Remove the mouthpiece from your mouth and breathe out normally. 10. Rest for a few seconds and repeat Steps  1 through 7 at least 10 times every 1-2 hours when you are awake. Take your time and take a few normal breaths between deep breaths. 11. The spirometer may include an indicator to show your best effort. Use the indicator as a goal to work toward during each repetition. 12. After each set of 10 deep breaths, practice coughing to be sure your lungs are clear. If you have an incision (the cut made at the time of surgery), support your incision when coughing by placing a pillow or rolled up towels firmly against it. Once you are able to get out of bed, walk around indoors and cough well. You may stop using the incentive spirometer when instructed by your caregiver.  RISKS AND COMPLICATIONS  Take your time so you do not get dizzy or light-headed.  If you are in pain, you may need to take or ask for pain medication before doing incentive spirometry. It is harder to take a deep breath if you are having pain. AFTER USE  Rest and breathe slowly and easily.  It can be helpful to keep track of a log of your progress. Your caregiver can provide you with a simple table to help with this. If you are using the spirometer at home, follow these instructions: [place] IF:   You are having difficultly using the spirometer.  You have trouble using the spirometer as often as instructed.  Your pain medication is not giving enough relief while using the spirometer.  You develop fever of 100.5 F  (38.1 C) or higher. SEEK IMMEDIATE MEDICAL CARE IF:   You cough up bloody sputum that had not been present before.  You develop fever of 102 F (38.9 C) or greater.  You develop worsening pain at or near the incision site. MAKE SURE YOU:   Understand these instructions.  Will watch your condition.  Will get help right away if you are not doing well or get worse. Document Released: 09/07/2006 Document Revised: 07/20/2011 Document Reviewed: 11/08/2006 [facility] Patient Information 2014 [place], [place].   ________________________________________________________________________

## 2015-03-27 ENCOUNTER — Encounter (HOSPITAL_COMMUNITY)
Admission: RE | Admit: 2015-03-27 | Discharge: 2015-03-27 | Disposition: A | Discharge status: Home / Self Care | Payer: Medicare Other | Source: Ambulatory Visit | Attending: Orthopedic Surgery | Admitting: Orthopedic Surgery

## 2015-03-27 ENCOUNTER — Encounter (HOSPITAL_COMMUNITY): Payer: Self-pay

## 2015-03-27 DIAGNOSIS — Z01810 Encounter for preprocedural cardiovascular examination: Secondary | ICD-10-CM | POA: Diagnosis present

## 2015-03-27 DIAGNOSIS — Z01812 Encounter for preprocedural laboratory examination: Secondary | ICD-10-CM | POA: Insufficient documentation

## 2015-03-27 HISTORY — DX: Unspecified osteoarthritis, unspecified site: M19.90

## 2015-03-27 LAB — PROTIME-INR
INR: 1.03 (ref 0.00–1.49)
Prothrombin Time: 13.7 seconds (ref 11.6–15.2)

## 2015-03-27 LAB — TYPE AND SCREEN
ABO/RH(D): A NEG
Antibody Screen: NEGATIVE

## 2015-03-27 LAB — APTT: aPTT: 29 seconds (ref 24–37)

## 2015-03-27 LAB — CBC
HEMATOCRIT: 40.8 % (ref 36.0–46.0)
Hemoglobin: 13.9 g/dL (ref 12.0–15.0)
MCH: 29.9 pg (ref 26.0–34.0)
MCHC: 34.1 g/dL (ref 30.0–36.0)
MCV: 87.7 fL (ref 78.0–100.0)
Platelets: 243 10*3/uL (ref 150–400)
RBC: 4.65 MIL/uL (ref 3.87–5.11)
RDW: 12.5 % (ref 11.5–15.5)
WBC: 8.1 10*3/uL (ref 4.0–10.5)

## 2015-03-27 LAB — URINE MICROSCOPIC-ADD ON: RBC / HPF: NONE SEEN RBC/hpf (ref 0–5)

## 2015-03-27 LAB — URINALYSIS, ROUTINE W REFLEX MICROSCOPIC
Bilirubin Urine: NEGATIVE
Glucose, UA: NEGATIVE mg/dL
HGB URINE DIPSTICK: NEGATIVE
Ketones, ur: NEGATIVE mg/dL
Nitrite: NEGATIVE
PROTEIN: NEGATIVE mg/dL
Specific Gravity, Urine: 1.014 (ref 1.005–1.030)
pH: 6 (ref 5.0–8.0)

## 2015-03-27 LAB — BASIC METABOLIC PANEL
Anion gap: 9 (ref 5–15)
BUN: 19 mg/dL (ref 6–20)
CHLORIDE: 105 mmol/L (ref 101–111)
CO2: 28 mmol/L (ref 22–32)
Calcium: 9.6 mg/dL (ref 8.9–10.3)
Creatinine, Ser: 0.68 mg/dL (ref 0.44–1.00)
GFR calc Af Amer: >60 mL/min (ref >60)
GFR calc non Af Amer: >60 mL/min (ref >60)
GLUCOSE: 120 mg/dL — AB (ref 65–99)
POTASSIUM: 4.4 mmol/L (ref 3.5–5.1)
Sodium: 142 mmol/L (ref 135–145)

## 2015-03-27 LAB — SURGICAL PCR SCREEN
MRSA, PCR: NEGATIVE
Staphylococcus aureus: POSITIVE — AB

## 2015-03-27 LAB — ABO/RH: ABO/RH(D): A NEG

## 2015-03-27 NOTE — Progress Notes (Signed)
U/A and micro results done 03/27/15 faxed via EP[facility] to Dr Alvan Dame.

## 2015-03-28 NOTE — Progress Notes (Signed)
Clearance- DR hopper on chart 03/21/2015  02/27/15 LOV with Pulmonary - EPIC

## 2015-03-28 NOTE — Progress Notes (Signed)
Final EKG done 03/27/15 in EP[facility] .

## 2015-04-01 ENCOUNTER — Other Ambulatory Visit: Payer: Self-pay | Admitting: Internal Medicine

## 2015-04-02 ENCOUNTER — Telehealth: Payer: Self-pay | Admitting: Pulmonary Disease

## 2015-04-02 ENCOUNTER — Other Ambulatory Visit: Payer: Self-pay | Admitting: Emergency Medicine

## 2015-04-02 DIAGNOSIS — E785 Hyperlipidemia, unspecified: Secondary | ICD-10-CM

## 2015-04-02 MED ORDER — SIMVASTATIN 20 MG PO TABS: 20.0000 mg | ORAL_TABLET | Freq: Every day | ORAL | Status: DC

## 2015-04-02 NOTE — Telephone Encounter (Signed)
Pt is requesting a refill on Hydrocodone. Last OV was on 02/27/15 with RA. Has a pending OV with TP in 08/2015. Last refill on was on 02/27/15.  RA - please advise on refill. Thanks.

## 2015-04-03 NOTE — Telephone Encounter (Signed)
OK to refill x1 Iron levels ok Pl let her know there are mechanical devices for restless legs available if she would like to try - RELAXIS or foot wrap called restiffic - she can research this online & call us back May help her reduce dependency on some of these meds

## 2015-04-03 NOTE — Telephone Encounter (Signed)
lmtcb x1 

## 2015-04-05 NOTE — Telephone Encounter (Signed)
lmtcb

## 2015-04-07 NOTE — H&P (Signed)
TOTAL KNEE ADMISSION H&P  Patient is being admitted for left total knee arthroplasty.  Subjective:  Chief Complaint:   Left knee primary OA / pain  HPI: Erin Roach, 74 y.o. female, has a history of pain and functional disability in the left knee due to arthritis and has failed non-surgical conservative treatments for greater than 12 weeks to include NSAID's and/or analgesics, corticosteriod injections, use of assistive devices and activity modification.  Onset of symptoms was gradual, starting 2+ months ago  with rapidlly worsening course since that time. The patient noted no past surgery on the left knee(s).  Patient currently rates pain in the left knee(s) at 9 out of 10 with activity. Patient has night pain, worsening of pain with activity and weight bearing, pain that interferes with activities of daily living, pain with passive range of motion, crepitus and joint swelling.  Patient has evidence of periarticular osteophytes and joint space narrowing by imaging studies.   There is no active infection.  Risks, benefits and expectations were discussed with the patient.  Risks including but not limited to the risk of anesthesia, blood clots, nerve damage, blood vessel damage, failure of the prosthesis, infection and up to and including death.  Patient understand the risks, benefits and expectations and wishes to proceed with surgery.   PCP: Unice Cobble, MD  D/C Plans:      Home with HHPT  Post-op Meds:       No Rx given  Tranexamic Acid:      To be given - IV  Decadron:      Is to be given  FYI:     ASA  Norco    Patient Active Problem List   Diagnosis Date Noted  . Hyperglycemia 01/09/2015  . Restless leg 12/19/2013  . Osteopenia 12/04/2012  . GERD (gastroesophageal reflux disease) 02/07/2012  . Piriformis syndrome 01/07/2011  . FOOT PAIN, BILATERAL 07/17/2010  . ELEVATED BLOOD PRESSURE WITHOUT DIAGNOSIS OF HYPERTENSION 01/15/2010  . 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 RESTLESS  LEG SYNDROME 02/11/2009  . LEG EDEMA, BILATERAL 02/11/2009  . Hyperlipidemia 10/30/2008  . URINARY INCONTINENCE 10/30/2008  . SKIN CANCER, HX OF 10/30/2008  . Asthma 11/21/2007  . VENOUS INSUFFICIENCY, LEGS 11/22/2006  . SNORING 11/22/2006   Past Medical History  Diagnosis Date  . Venous insufficiency     legs  . Leg edema     bilateral  . RLS (restless legs syndrome)   . Esophageal reflux   . Urinary incontinence   . Skin cancer     hx of squamous cell buttocks area, Yevette Edwards MD,Gen Surgery: Amy Martinique MD, derm  . Asthma   . Snoring     no sleep study  . Heart murmur   . Hypertension   . Arthritis     Past Surgical History  Procedure Laterality Date  . Appendectomy    . Tonsillectomy    . Skin cancer removal      squamous cell interglureal space  . Ulnar nerve surgery      04/20/13:  pt denies  . Colonoscopy      neg; Dr Collene Mares  . Cystoscopy  2004    ? negative, Dr Jeffie Pollock    No prescriptions prior to admission   No Known Allergies   Social History  Substance Use Topics  . Smoking status: Former Smoker -- 0.50 packs/day for 15 years    Types: Cigarettes  . Smokeless tobacco: Never Used     Comment: smoked age  [age], up to 1 ppd  . Alcohol Use: 1.8 oz/week    3 Glasses of wine per week     Comment: socially    Family History  Problem Relation Age of Onset  . Esophageal cancer Father   . Hyperlipidemia Mother   . Stroke Maternal Grandmother 66  . Diabetes Maternal Grandmother   . Heart attack Maternal Grandfather 93  . COPD Maternal Grandfather   . Heart attack Paternal Grandmother     23s  . Diabetes      MGGM     Review of Systems  Constitutional: Negative.   HENT: Negative.   Eyes: Negative.   Respiratory: Negative.   Cardiovascular: Negative.   Gastrointestinal: Positive for heartburn.  Genitourinary: Negative.   Musculoskeletal: Positive for joint pain.  Skin: Negative.   Neurological: Negative.   Endo/Heme/Allergies: Negative.    Psychiatric/Behavioral: Negative.     Objective:  Physical Exam  Constitutional: She is oriented to person, place, and time. She appears well-developed and well-nourished.  HENT:  Head: Normocephalic and atraumatic.  Eyes: Pupils are equal, round, and reactive to light.  Neck: Neck supple. No JVD present. No tracheal deviation present. No thyromegaly present.  Cardiovascular: Normal rate, regular rhythm and intact distal pulses.   Murmur heard. Respiratory: Effort normal and breath sounds normal. No stridor. No respiratory distress. She has no wheezes.  GI: Soft. There is no tenderness. There is no guarding.  Musculoskeletal:       Left knee: She exhibits decreased range of motion, swelling and bony tenderness. She exhibits no ecchymosis, no deformity, no laceration and no erythema. Tenderness found.  Lymphadenopathy:    She has no cervical adenopathy.  Neurological: She is alert and oriented to person, place, and time.  Skin: Skin is warm and dry.  Psychiatric: She has a normal mood and affect.      Labs:  Estimated body mass index is 32.55 kg/(m^2) as calculated from the following:   Height as of 02/27/15: 5\' 6"  (1.676 m).   Weight as of 02/27/15: 91.445 kg (201 lb 9.6 oz).   Imaging Review Plain radiographs demonstrate severe degenerative joint disease of the left knee(s).  The bone quality appears to be good for age and reported activity level.  Assessment/Plan:  End stage arthritis, left knee   The patient history, physical examination, clinical judgment of the provider and imaging studies are consistent with end stage degenerative joint disease of the left knee(s) and total knee arthroplasty is deemed medically necessary. The treatment options including medical management, injection therapy arthroscopy and arthroplasty were discussed at length. The risks and benefits of total knee arthroplasty were presented and reviewed. The risks due to aseptic loosening, infection,  stiffness, patella tracking problems, thromboembolic complications and other imponderables were discussed. The patient acknowledged the explanation, agreed to proceed with the plan and consent was signed. Patient is being admitted for inpatient treatment for surgery, pain control, PT, OT, prophylactic antibiotics, VTE prophylaxis, progressive ambulation and ADL's and discharge planning. The patient is planning to be discharged home with home health services.     [place] Pugh Candace Ramus   PA-C  04/07/2015, 10:09 PM

## 2015-04-08 MED ORDER — HYDROCODONE-ACETAMINOPHEN 5-325 MG PO TABS: 1.0000 | ORAL_TABLET | Freq: Four times a day (QID) | ORAL | Status: DC | PRN

## 2015-04-08 NOTE — Telephone Encounter (Signed)
Rx printed and signed, left at front to be picked up. Patient notified of Dr. Bari Mantis recommendations and that Rx is at front. Patient states she will send husband to pick up rx today. She says that she will research the restless legs information and will let us know if she wants to try it. Nothing further needed. Closing encounter

## 2015-04-09 ENCOUNTER — Inpatient Hospital Stay (HOSPITAL_COMMUNITY): Payer: Medicare Other | Admitting: Certified Registered Nurse Anesthetist

## 2015-04-09 ENCOUNTER — Inpatient Hospital Stay (HOSPITAL_COMMUNITY)
Admission: RE | Admit: 2015-04-09 | Discharge: 2015-04-10 | DRG: 470 | Disposition: A | Discharge status: Home Health | Payer: Medicare Other | Source: Ambulatory Visit | Attending: Orthopedic Surgery | Admitting: Orthopedic Surgery

## 2015-04-09 ENCOUNTER — Encounter (HOSPITAL_COMMUNITY)
Admission: RE | Disposition: A | Discharge status: Home Health | Payer: Self-pay | Source: Ambulatory Visit | Attending: Orthopedic Surgery

## 2015-04-09 ENCOUNTER — Encounter (HOSPITAL_COMMUNITY): Payer: Self-pay | Admitting: *Deleted

## 2015-04-09 DIAGNOSIS — I10 Essential (primary) hypertension: Secondary | ICD-10-CM | POA: Diagnosis present

## 2015-04-09 DIAGNOSIS — I739 Peripheral vascular disease, unspecified: Secondary | ICD-10-CM | POA: Diagnosis present

## 2015-04-09 DIAGNOSIS — E785 Hyperlipidemia, unspecified: Secondary | ICD-10-CM | POA: Diagnosis present

## 2015-04-09 DIAGNOSIS — Z87891 Personal history of nicotine dependence: Secondary | ICD-10-CM

## 2015-04-09 DIAGNOSIS — M659 Synovitis and tenosynovitis, unspecified: Secondary | ICD-10-CM | POA: Diagnosis present

## 2015-04-09 DIAGNOSIS — Z96659 Presence of unspecified artificial knee joint: Secondary | ICD-10-CM

## 2015-04-09 DIAGNOSIS — G250 Essential tremor: Secondary | ICD-10-CM | POA: Diagnosis present

## 2015-04-09 DIAGNOSIS — J45909 Unspecified asthma, uncomplicated: Secondary | ICD-10-CM | POA: Diagnosis present

## 2015-04-09 DIAGNOSIS — E669 Obesity, unspecified: Secondary | ICD-10-CM | POA: Diagnosis present

## 2015-04-09 DIAGNOSIS — K219 Gastro-esophageal reflux disease without esophagitis: Secondary | ICD-10-CM | POA: Diagnosis present

## 2015-04-09 DIAGNOSIS — G2581 Restless legs syndrome: Secondary | ICD-10-CM | POA: Diagnosis present

## 2015-04-09 DIAGNOSIS — Z85828 Personal history of other malignant neoplasm of skin: Secondary | ICD-10-CM | POA: Diagnosis not present

## 2015-04-09 DIAGNOSIS — M1712 Unilateral primary osteoarthritis, left knee: Principal | ICD-10-CM | POA: Diagnosis present

## 2015-04-09 DIAGNOSIS — Z6832 Body mass index (BMI) 32.0-32.9, adult: Secondary | ICD-10-CM | POA: Diagnosis not present

## 2015-04-09 DIAGNOSIS — E66811 Obesity, class 1: Secondary | ICD-10-CM | POA: Diagnosis present

## 2015-04-09 HISTORY — PX: TOTAL KNEE ARTHROPLASTY: SHX125

## 2015-04-09 SURGERY — ARTHROPLASTY, KNEE, TOTAL
Anesthesia: Spinal | Site: Knee | Laterality: Left

## 2015-04-09 MED ORDER — PROPOFOL 10 MG/ML IV BOLUS
INTRAVENOUS | Status: AC
  Filled 2015-04-09: qty 20

## 2015-04-09 MED ORDER — FENTANYL CITRATE (PF) 100 MCG/2ML IJ SOLN
INTRAMUSCULAR | Status: AC
  Filled 2015-04-09: qty 2

## 2015-04-09 MED ORDER — MIRABEGRON ER 50 MG PO TB24
50.0000 mg | ORAL_TABLET | Freq: Every day | ORAL | Status: DC
  Administered 2015-04-09: 50 mg via ORAL
  Filled 2015-04-09 (×2): qty 1

## 2015-04-09 MED ORDER — MENTHOL 3 MG MT LOZG: 1.0000 | LOZENGE | OROMUCOSAL | Status: DC | PRN

## 2015-04-09 MED ORDER — HYDROMORPHONE HCL 1 MG/ML IJ SOLN: 0.2500 mg | INTRAMUSCULAR | Status: DC | PRN

## 2015-04-09 MED ORDER — CEFAZOLIN SODIUM-DEXTROSE 2-3 GM-% IV SOLR
2.0000 g | INTRAVENOUS | Status: AC
  Administered 2015-04-09: 2 g via INTRAVENOUS

## 2015-04-09 MED ORDER — CEFAZOLIN SODIUM-DEXTROSE 2-3 GM-% IV SOLR
2.0000 g | Freq: Four times a day (QID) | INTRAVENOUS | Status: AC
  Administered 2015-04-09 (×2): 2 g via INTRAVENOUS
  Filled 2015-04-09 (×2): qty 50

## 2015-04-09 MED ORDER — PREGABALIN 75 MG PO CAPS
75.0000 mg | ORAL_CAPSULE | Freq: Every day | ORAL | Status: DC
  Administered 2015-04-09: 75 mg via ORAL
  Filled 2015-04-09: qty 1

## 2015-04-09 MED ORDER — SPIRONOLACTONE 25 MG PO TABS
25.0000 mg | ORAL_TABLET | Freq: Two times a day (BID) | ORAL | Status: DC
  Administered 2015-04-09 – 2015-04-10 (×3): 25 mg via ORAL
  Filled 2015-04-09 (×4): qty 1

## 2015-04-09 MED ORDER — DEXAMETHASONE SODIUM PHOSPHATE 10 MG/ML IJ SOLN
INTRAMUSCULAR | Status: DC | PRN
  Administered 2015-04-09: 10 mg via INTRAVENOUS

## 2015-04-09 MED ORDER — FERROUS SULFATE 325 (65 FE) MG PO TABS
325.0000 mg | ORAL_TABLET | Freq: Three times a day (TID) | ORAL | Status: DC
  Administered 2015-04-09 – 2015-04-10 (×4): 325 mg via ORAL
  Filled 2015-04-09 (×6): qty 1

## 2015-04-09 MED ORDER — PREGABALIN 75 MG PO CAPS
150.0000 mg | ORAL_CAPSULE | Freq: Every day | ORAL | Status: DC
  Administered 2015-04-09: 150 mg via ORAL
  Filled 2015-04-09: qty 2

## 2015-04-09 MED ORDER — PREGABALIN 75 MG PO CAPS: 75.0000 mg | ORAL_CAPSULE | Freq: Three times a day (TID) | ORAL | Status: DC

## 2015-04-09 MED ORDER — CLONAZEPAM 0.5 MG PO TABS: 0.5000 mg | ORAL_TABLET | ORAL | Status: DC

## 2015-04-09 MED ORDER — BUPIVACAINE-EPINEPHRINE (PF) 0.25% -1:200000 IJ SOLN
INTRAMUSCULAR | Status: DC | PRN
  Administered 2015-04-09: 30 mL

## 2015-04-09 MED ORDER — ONDANSETRON HCL 4 MG/2ML IJ SOLN: 4.0000 mg | Freq: Four times a day (QID) | INTRAMUSCULAR | Status: DC | PRN

## 2015-04-09 MED ORDER — KETOROLAC TROMETHAMINE 30 MG/ML IJ SOLN
INTRAMUSCULAR | Status: AC
  Filled 2015-04-09: qty 1

## 2015-04-09 MED ORDER — MIDAZOLAM HCL 5 MG/5ML IJ SOLN
INTRAMUSCULAR | Status: DC | PRN
  Administered 2015-04-09: 2 mg via INTRAVENOUS

## 2015-04-09 MED ORDER — METHOCARBAMOL 500 MG PO TABS
500.0000 mg | ORAL_TABLET | Freq: Four times a day (QID) | ORAL | Status: DC | PRN
  Administered 2015-04-10 (×2): 500 mg via ORAL
  Filled 2015-04-09 (×2): qty 1

## 2015-04-09 MED ORDER — SODIUM CHLORIDE 0.9 % IV SOLN
INTRAVENOUS | Status: DC
  Administered 2015-04-09 – 2015-04-10 (×2): via INTRAVENOUS
  Filled 2015-04-09 (×5): qty 1000

## 2015-04-09 MED ORDER — BUPIVACAINE-EPINEPHRINE (PF) 0.25% -1:200000 IJ SOLN
INTRAMUSCULAR | Status: AC
  Filled 2015-04-09: qty 30

## 2015-04-09 MED ORDER — DOCUSATE SODIUM 100 MG PO CAPS
100.0000 mg | ORAL_CAPSULE | Freq: Two times a day (BID) | ORAL | Status: DC
Start: 2015-04-09 — End: 2015-04-10
  Administered 2015-04-09 – 2015-04-10 (×3): 100 mg via ORAL

## 2015-04-09 MED ORDER — SODIUM CHLORIDE 0.9 % IR SOLN
Status: DC | PRN
  Administered 2015-04-09: 1000 mL

## 2015-04-09 MED ORDER — BISACODYL 10 MG RE SUPP: 10.0000 mg | Freq: Every day | RECTAL | Status: DC | PRN

## 2015-04-09 MED ORDER — ONDANSETRON HCL 4 MG/2ML IJ SOLN
INTRAMUSCULAR | Status: AC
  Filled 2015-04-09: qty 4

## 2015-04-09 MED ORDER — METOCLOPRAMIDE HCL 5 MG/ML IJ SOLN: 5.0000 mg | Freq: Three times a day (TID) | INTRAMUSCULAR | Status: DC | PRN

## 2015-04-09 MED ORDER — CEFAZOLIN SODIUM-DEXTROSE 2-3 GM-% IV SOLR
INTRAVENOUS | Status: AC
  Filled 2015-04-09: qty 50

## 2015-04-09 MED ORDER — PREGABALIN 75 MG PO CAPS: 75.0000 mg | ORAL_CAPSULE | Freq: Every day | ORAL | Status: DC

## 2015-04-09 MED ORDER — SODIUM CHLORIDE 0.9 % IJ SOLN
INTRAMUSCULAR | Status: AC
  Filled 2015-04-09: qty 50

## 2015-04-09 MED ORDER — POLYETHYLENE GLYCOL 3350 17 G PO PACK
17.0000 g | PACK | Freq: Two times a day (BID) | ORAL | Status: DC
  Administered 2015-04-09 – 2015-04-10 (×3): 17 g via ORAL

## 2015-04-09 MED ORDER — DEXAMETHASONE SODIUM PHOSPHATE 10 MG/ML IJ SOLN: 10.0000 mg | Freq: Once | INTRAMUSCULAR | Status: DC

## 2015-04-09 MED ORDER — ONDANSETRON HCL 4 MG/2ML IJ SOLN
INTRAMUSCULAR | Status: DC | PRN
  Administered 2015-04-09: 4 mg via INTRAVENOUS

## 2015-04-09 MED ORDER — HYDROCODONE-ACETAMINOPHEN 7.5-325 MG PO TABS
1.0000 | ORAL_TABLET | ORAL | Status: DC
  Administered 2015-04-09 – 2015-04-10 (×7): 2 via ORAL
  Filled 2015-04-09 (×7): qty 2

## 2015-04-09 MED ORDER — ONDANSETRON HCL 4 MG PO TABS: 4.0000 mg | ORAL_TABLET | Freq: Four times a day (QID) | ORAL | Status: DC | PRN

## 2015-04-09 MED ORDER — METOCLOPRAMIDE HCL 10 MG PO TABS: 5.0000 mg | ORAL_TABLET | Freq: Three times a day (TID) | ORAL | Status: DC | PRN

## 2015-04-09 MED ORDER — CHLORHEXIDINE GLUCONATE 4 % EX LIQD: 60.0000 mL | Freq: Once | CUTANEOUS | Status: DC

## 2015-04-09 MED ORDER — PHENOL 1.4 % MT LIQD
1.0000 | OROMUCOSAL | Status: DC | PRN
  Filled 2015-04-09: qty 177

## 2015-04-09 MED ORDER — PROPOFOL 10 MG/ML IV BOLUS
INTRAVENOUS | Status: AC
  Filled 2015-04-09: qty 40

## 2015-04-09 MED ORDER — DIPHENHYDRAMINE HCL 25 MG PO CAPS
25.0000 mg | ORAL_CAPSULE | Freq: Four times a day (QID) | ORAL | Status: DC | PRN
  Administered 2015-04-10: 25 mg via ORAL
  Filled 2015-04-09: qty 1

## 2015-04-09 MED ORDER — DEXAMETHASONE SODIUM PHOSPHATE 10 MG/ML IJ SOLN
INTRAMUSCULAR | Status: AC
  Filled 2015-04-09: qty 1

## 2015-04-09 MED ORDER — MAGNESIUM CITRATE PO SOLN: 1.0000 | Freq: Once | ORAL | Status: DC | PRN

## 2015-04-09 MED ORDER — MIDAZOLAM HCL 2 MG/2ML IJ SOLN
INTRAMUSCULAR | Status: AC
  Filled 2015-04-09: qty 2

## 2015-04-09 MED ORDER — PRAMIPEXOLE DIHYDROCHLORIDE 0.25 MG PO TABS
0.5000 mg | ORAL_TABLET | Freq: Two times a day (BID) | ORAL | Status: DC
  Administered 2015-04-09 (×2): 0.5 mg via ORAL
  Filled 2015-04-09 (×4): qty 2

## 2015-04-09 MED ORDER — SIMVASTATIN 20 MG PO TABS
20.0000 mg | ORAL_TABLET | Freq: Every day | ORAL | Status: DC
  Administered 2015-04-09: 20 mg via ORAL
  Filled 2015-04-09 (×2): qty 1

## 2015-04-09 MED ORDER — LIDOCAINE HCL (CARDIAC) 20 MG/ML IV SOLN
INTRAVENOUS | Status: AC
  Filled 2015-04-09: qty 15

## 2015-04-09 MED ORDER — CELECOXIB 200 MG PO CAPS
200.0000 mg | ORAL_CAPSULE | Freq: Two times a day (BID) | ORAL | Status: DC
  Administered 2015-04-09 – 2015-04-10 (×3): 200 mg via ORAL
  Filled 2015-04-09 (×4): qty 1

## 2015-04-09 MED ORDER — PROPOFOL 500 MG/50ML IV EMUL
INTRAVENOUS | Status: DC | PRN
  Administered 2015-04-09: 50 ug/kg/min via INTRAVENOUS

## 2015-04-09 MED ORDER — 0.9 % SODIUM CHLORIDE (POUR BTL) OPTIME
TOPICAL | Status: DC | PRN
  Administered 2015-04-09: 1000 mL

## 2015-04-09 MED ORDER — PROMETHAZINE HCL 25 MG/ML IJ SOLN: 6.2500 mg | INTRAMUSCULAR | Status: DC | PRN

## 2015-04-09 MED ORDER — LIDOCAINE HCL (CARDIAC) 20 MG/ML IV SOLN
INTRAVENOUS | Status: DC | PRN
  Administered 2015-04-09: 50 mg via INTRAVENOUS

## 2015-04-09 MED ORDER — BUPIVACAINE IN DEXTROSE 0.75-8.25 % IT SOLN
INTRATHECAL | Status: DC | PRN
  Administered 2015-04-09: 1.6 mL via INTRATHECAL

## 2015-04-09 MED ORDER — DEXAMETHASONE SODIUM PHOSPHATE 10 MG/ML IJ SOLN
INTRAMUSCULAR | Status: AC
  Filled 2015-04-09: qty 3

## 2015-04-09 MED ORDER — MOMETASONE FURO-FORMOTEROL FUM 100-5 MCG/ACT IN AERO
2.0000 | INHALATION_SPRAY | Freq: Two times a day (BID) | RESPIRATORY_TRACT | Status: DC
  Filled 2015-04-09: qty 8.8

## 2015-04-09 MED ORDER — KETOROLAC TROMETHAMINE 30 MG/ML IJ SOLN
INTRAMUSCULAR | Status: DC | PRN
  Administered 2015-04-09: 30 mg via INTRAVENOUS

## 2015-04-09 MED ORDER — TRANEXAMIC ACID 1000 MG/10ML IV SOLN
1000.0000 mg | Freq: Once | INTRAVENOUS | Status: AC
  Administered 2015-04-09: 1000 mg via INTRAVENOUS
  Filled 2015-04-09: qty 10

## 2015-04-09 MED ORDER — HYDROMORPHONE HCL 1 MG/ML IJ SOLN
0.5000 mg | INTRAMUSCULAR | Status: DC | PRN
  Administered 2015-04-09 (×2): 0.5 mg via INTRAVENOUS
  Filled 2015-04-09 (×2): qty 1

## 2015-04-09 MED ORDER — DEXTROSE 5 % IV SOLN
500.0000 mg | Freq: Four times a day (QID) | INTRAVENOUS | Status: DC | PRN
  Administered 2015-04-09: 500 mg via INTRAVENOUS
  Filled 2015-04-09 (×2): qty 5

## 2015-04-09 MED ORDER — PHENYLEPHRINE 40 MCG/ML (10ML) SYRINGE FOR IV PUSH (FOR BLOOD PRESSURE SUPPORT)
PREFILLED_SYRINGE | INTRAVENOUS | Status: AC
  Filled 2015-04-09: qty 10

## 2015-04-09 MED ORDER — ASPIRIN EC 325 MG PO TBEC
325.0000 mg | DELAYED_RELEASE_TABLET | Freq: Two times a day (BID) | ORAL | Status: DC
  Administered 2015-04-10: 325 mg via ORAL
  Filled 2015-04-09 (×3): qty 1

## 2015-04-09 MED ORDER — ALUM & MAG HYDROXIDE-SIMETH 200-200-20 MG/5ML PO SUSP: 30.0000 mL | ORAL | Status: DC | PRN

## 2015-04-09 MED ORDER — LACTATED RINGERS IV SOLN
INTRAVENOUS | Status: DC
  Administered 2015-04-09: 1000 mL via INTRAVENOUS
  Administered 2015-04-09: 10:00:00 via INTRAVENOUS

## 2015-04-09 MED ORDER — SODIUM CHLORIDE 0.9 % IJ SOLN
INTRAMUSCULAR | Status: DC | PRN
  Administered 2015-04-09: 30 mL

## 2015-04-09 SURGICAL SUPPLY — 44 items
BAG DECANTER FOR FLEXI CONT (MISCELLANEOUS) IMPLANT
BAG SPEC THK2 15X12 ZIP CLS (MISCELLANEOUS)
BAG ZIPLOCK 12X15 (MISCELLANEOUS) IMPLANT
BANDAGE ELASTIC 6 VELCRO ST LF (GAUZE/BANDAGES/DRESSINGS) ×2 IMPLANT
BLADE SAW SGTL 13.0X1.19X90.0M (BLADE) ×2 IMPLANT
BOWL SMART MIX CTS (DISPOSABLE) ×2 IMPLANT
CAPT KNEE TOTAL 3 ATTUNE ×1 IMPLANT
CEMENT HV SMART SET (Cement) ×2 IMPLANT
CLOTH BEACON ORANGE TIMEOUT ST (SAFETY) ×2 IMPLANT
CUFF TOURN SGL QUICK 34 (TOURNIQUET CUFF) ×2
CUFF TRNQT CYL 34X4X40X1 (TOURNIQUET CUFF) ×1 IMPLANT
DECANTER SPIKE VIAL GLASS SM (MISCELLANEOUS) ×2 IMPLANT
DRAPE U-SHAPE 47X51 STRL (DRAPES) ×2 IMPLANT
DRSG AQUACEL AG ADV 3.5X10 (GAUZE/BANDAGES/DRESSINGS) ×2 IMPLANT
DURAPREP 26ML APPLICATOR (WOUND CARE) ×4 IMPLANT
ELECT REM PT RETURN 9FT ADLT (ELECTROSURGICAL) ×2
ELECTRODE REM PT RTRN 9FT ADLT (ELECTROSURGICAL) ×1 IMPLANT
GLOVE BIOGEL M 7.0 STRL (GLOVE) IMPLANT
GLOVE BIOGEL PI IND STRL 7.5 (GLOVE) ×1 IMPLANT
GLOVE BIOGEL PI IND STRL 8.5 (GLOVE) ×1 IMPLANT
GLOVE BIOGEL PI INDICATOR 7.5 (GLOVE) ×1
GLOVE BIOGEL PI INDICATOR 8.5 (GLOVE) ×1
GLOVE ECLIPSE 8.0 STRL XLNG CF (GLOVE) ×2 IMPLANT
GLOVE ORTHO TXT STRL SZ7.5 (GLOVE) ×4 IMPLANT
GOWN STRL REUS W/TWL LRG LVL3 (GOWN DISPOSABLE) ×2 IMPLANT
GOWN STRL REUS W/TWL XL LVL3 (GOWN DISPOSABLE) ×2 IMPLANT
HANDPIECE INTERPULSE COAX TIP (DISPOSABLE) ×2
LIQUID BAND (GAUZE/BANDAGES/DRESSINGS) ×2 IMPLANT
MANIFOLD NEPTUNE II (INSTRUMENTS) ×2 IMPLANT
PACK TOTAL KNEE CUSTOM (KITS) ×2 IMPLANT
POSITIONER SURGICAL ARM (MISCELLANEOUS) ×2 IMPLANT
SET HNDPC FAN SPRY TIP SCT (DISPOSABLE) ×1 IMPLANT
SET PAD KNEE POSITIONER (MISCELLANEOUS) ×2 IMPLANT
SUCTION FRAZIER 12FR DISP (SUCTIONS) ×2 IMPLANT
SUT MNCRL AB 4-0 PS2 18 (SUTURE) ×2 IMPLANT
SUT VIC AB 1 CT1 36 (SUTURE) ×2 IMPLANT
SUT VIC AB 2-0 CT1 27 (SUTURE) ×6
SUT VIC AB 2-0 CT1 TAPERPNT 27 (SUTURE) ×3 IMPLANT
SUT VLOC 180 0 24IN GS25 (SUTURE) ×2 IMPLANT
SYR 50ML LL SCALE MARK (SYRINGE) ×2 IMPLANT
TRAY FOLEY W/METER SILVER 14FR (SET/KITS/TRAYS/PACK) ×2 IMPLANT
WATER STERILE IRR 1500ML POUR (IV SOLUTION) ×2 IMPLANT
WRAP KNEE MAXI GEL POST OP (GAUZE/BANDAGES/DRESSINGS) ×1 IMPLANT
YANKAUER SUCT BULB TIP 10FT TU (MISCELLANEOUS) ×2 IMPLANT

## 2015-04-09 NOTE — Interval H&P Note (Signed)
History and Physical Interval Note:  04/09/2015 7:08 AM  Erin Roach  has presented today for surgery, with the diagnosis of LEFT KNEE OA  The various methods of treatment have been discussed with the patient and family. After consideration of risks, benefits and other options for treatment, the patient has consented to  Procedure(s): LEFT TOTAL KNEE ARTHROPLASTY (Left) as a surgical intervention .  The patient's history has been reviewed, patient examined, no change in status, stable for surgery.  I have reviewed the patient's chart and labs.  Questions were answered to the patient's satisfaction.     Mauri Pole

## 2015-04-09 NOTE — Anesthesia Postprocedure Evaluation (Signed)
Anesthesia Post Note  Patient: Erin Roach  Procedure(s) Performed: Procedure(s) (LRB): LEFT TOTAL KNEE ARTHROPLASTY (Left)  Patient location during evaluation: PACU Anesthesia Type: Spinal Level of consciousness: oriented and awake and alert Pain management: pain level controlled Vital Signs Assessment: post-procedure vital signs reviewed and stable Respiratory status: spontaneous breathing, respiratory function stable and patient connected to nasal cannula oxygen Cardiovascular status: blood pressure returned to baseline and stable Postop Assessment: no headache and no backache Anesthetic complications: no    Last Vitals:  Filed Vitals:   04/09/15 1145 04/09/15 1203  BP: 147/82 145/77  Pulse: 82 87  Temp:  36.6 C  Resp: 15 14    Last Pain: There were no vitals filed for this visit.  LLE Motor Response: No movement due to regional block LLE Sensation: No sensation (absent) RLE Motor Response: Purposeful movement RLE Sensation: No sensation (absent) L Sensory Level: S1-Sole of foot, small toes R Sensory Level: L4-Anterior knee, lower leg  Sigismund Cross J

## 2015-04-09 NOTE — Op Note (Signed)
NAME:  [place] RECORD NO.:  [id number]                             FACILITY:  [facility]      PHYSICIAN:  Pietro Cassis. Alvan Dame, M.D.  DATE OF BIRTH:  June 17, 1940      DATE OF PROCEDURE:  04/09/2015                                     OPERATIVE REPORT         PREOPERATIVE DIAGNOSIS:  Left knee osteoarthritis.      POSTOPERATIVE DIAGNOSIS:  Left knee osteoarthritis.      FINDINGS:  The patient was noted to have complete loss of cartilage and   bone-on-bone arthritis with associated osteophytes in the medial and patellofemoral compartments of   the knee with a significant synovitis and associated effusion.      PROCEDURE:  Left total knee replacement.      COMPONENTS USED:  DePuy Attune rotating platform posterior stabilized knee   system, a size 5N femur, 4 tibia, size 6 mm PS AOX insert, and 35 anatomic patellar   button.      SURGEON:  Pietro Cassis. Alvan Dame, M.D.      ASSISTANT:  Nehemiah Massed, PA-C.      ANESTHESIA:  Spinal.      SPECIMENS:  None.      COMPLICATION:  None.      DRAINS:  None.  EBL: <100cc      TOURNIQUET TIME:   Total Tourniquet Time Documented: Thigh (Left) - 30 minutes Total: Thigh (Left) - 30 minutes  .      The patient was stable to the recovery room.      INDICATION FOR PROCEDURE:  Erin Roach is a 74 y.o. female patient of   mine.  The patient had been seen, evaluated, and treated conservatively in the   office with medication, activity modification, and injections.  The patient had   radiographic changes of bone-on-bone arthritis with endplate sclerosis and osteophytes noted.      The patient failed conservative measures including medication, injections, and activity modification, and at this point was ready for more definitive measures.   Based on the radiographic changes and failed conservative measures, the patient   decided to proceed with total knee replacement.  Risks of infection,   DVT, component failure,  need for revision surgery, postop course, and   expectations were all   discussed and reviewed.  Consent was obtained for benefit of pain   relief.      PROCEDURE IN DETAIL:  The patient was brought to the operative theater.   Once adequate anesthesia, preoperative antibiotics, 2 gm of Ancef, 1 gm of Tranexamic Acid, and 10 mg of Decadron administered, the patient was positioned supine with the left thigh tourniquet placed.  The left lower extremity was prepped and draped in sterile fashion.  A time-   out was performed identifying the patient, planned procedure, and   extremity.      The left lower extremity was placed in the [facility] leg holder.  The leg was   exsanguinated, tourniquet elevated to 250 mmHg.  A midline incision was   made followed  by median parapatellar arthrotomy.  Following initial   exposure, attention was first directed to the patella.  Precut   measurement was noted to be 22 mm.  I resected down to 14 mm and used a   35 patellar button to restore patellar height as well as cover the cut   surface.      The lug holes were drilled and a metal shim was placed to protect the   patella from retractors and saw blades.      At this point, attention was now directed to the femur.  The femoral   canal was opened with a drill, irrigated to try to prevent fat emboli.  An   intramedullary rod was passed at 3 degrees valgus, 9 mm of bone was   resected off the distal femur.  Following this resection, the tibia was   subluxated anteriorly.  Using the extramedullary guide, 2 mm of bone was resected off   the proximal medial tibia.  We confirmed the gap would be   stable medially and laterally with a size 5 mm insert as well as confirmed   the cut was perpendicular in the coronal plane, checking with an alignment rod.      Once this was done, I sized the femur to be a size 5 in the anterior-   posterior dimension, chose a narrow component based on medial and   lateral dimension.   The size 5 rotation block was then pinned in   position anterior referenced using the C-clamp to set rotation.  The   anterior, posterior, and  chamfer cuts were made without difficulty nor   notching making certain that I was along the anterior cortex to help   with flexion gap stability.      The final box cut was made off the lateral aspect of distal femur.      At this point, the tibia was sized to be a size 4, the size 4 tray was   then pinned in position through the medial third of the tubercle,   drilled, and keel punched.  Trial reduction was now carried with a 5 femur,  4 tibia, a size 5 then 6 mm insert, and the 35 anatomic patella botton.  The knee was brought to   extension, full extension with good flexion stability with the patella   tracking through the trochlea without application of pressure.  The femoral lug holes were drilled.  Given   all these findings, the trial components removed.  Final components were   opened and cement was mixed.  The knee was irrigated with normal saline   solution and pulse lavage.  The synovial lining was   then injected with 30 cc of 0.25% Marcaine with epinephrine and 1 cc of Toradol plus 30 cc of NS for a   total of 61 cc.      The knee was irrigated.  Final implants were then cemented onto clean and   dried cut surfaces of bone with the knee brought to extension with a size 6 mm trial insert.      Once the cement had fully cured, the excess cement was removed   throughout the knee.  I confirmed I was satisfied with the range of   motion and stability, and the final size 6 mm PS AOX insert was chosen.  It was   placed into the knee.      The tourniquet had been let down at 30 minutes.  No significant   hemostasis required.  The   extensor mechanism was then reapproximated using #1 Vicryl  And #0 V-lock sutures with the knee   in flexion.  The   remaining wound was closed with 2-0 Vicryl and running 4-0 Monocryl.   The knee was  cleaned, dried, dressed sterilely using Dermabond and   Aquacel dressing.  The patient was then   brought to recovery room in stable condition, tolerating the procedure   well.   Please note that Physician Assistant, Nehemiah Massed, PA-C, was present for the entirety of the case, and was utilized for pre-operative positioning, peri-operative retractor management, general facilitation of the procedure.  He was also utilized for primary wound closure at the end of the case.              Pietro Cassis Alvan Dame, M.D.    04/09/2015 10:30 AM

## 2015-04-09 NOTE — Transfer of Care (Signed)
Immediate Anesthesia Transfer of Care Note  Patient: Erin Roach  Procedure(s) Performed: Procedure(s): LEFT TOTAL KNEE ARTHROPLASTY (Left)  Patient Location: PACU  Anesthesia Type:Regional  Level of Consciousness: awake, alert  and oriented  Airway & Oxygen Therapy: Patient Spontanous Breathing and Patient connected to face mask oxygen  Post-op Assessment: Report given to RN and Post -op Vital signs reviewed and stable  Post vital signs: Reviewed and stable  Last Vitals:  Filed Vitals:   04/09/15 0613  BP: 137/66  Pulse: 95  Temp: 36.9 C  Resp: 18    Complications: No apparent anesthesia complications

## 2015-04-09 NOTE — Discharge Instructions (Signed)

## 2015-04-09 NOTE — Evaluation (Signed)
Physical Therapy Evaluation Patient Details Name: ONDRA KALE MRN: [id number] DOB: Sep 20, 1940 Today's Date: 04/09/2015   History of Present Illness  74 yo female s/p L TKA 04/09/15.   Clinical Impression  On eval, pt required Min assist for mobility-walked ~45 feet with RW. Pain rated 7/10. Will follow and progress as tolerated.    Follow Up Recommendations Home health PT;Supervision/Assistance - 24 hour    Equipment Recommendations  None recommended by PT    Recommendations for Other Services       Precautions / Restrictions Precautions Precautions: Fall Restrictions Weight Bearing Restrictions: No LLE Weight Bearing: Weight bearing as tolerated      Mobility  Bed Mobility Overal bed mobility: Needs Assistance Bed Mobility: Supine to Sit     Supine to sit: Min assist     General bed mobility comments: assist for L LE. Pt used trapeze.  Transfers Overall transfer level: Needs assistance Equipment used: Rolling walker (2 wheeled) Transfers: Sit to/from Stand Sit to Stand: Min assist         General transfer comment: Assist to rise, stabilize, control descent. VCs safety, technqiue, hand/ L LE placement  Ambulation/Gait Ambulation/Gait assistance: Min guard Ambulation Distance (Feet): 45 Feet Assistive device: Rolling walker (2 wheeled) Gait Pattern/deviations: Step-to pattern;Antalgic     General Gait Details: close guard for safety. VCs safety, sequence. slow gait speed.  Stairs            Wheelchair Mobility    Modified Rankin (Stroke Patients Only)       Balance                                             Pertinent Vitals/Pain Pain Assessment: 0-10 Pain Score: 7  Pain Location: L knee Pain Descriptors / Indicators: Sore Pain Intervention(s): Monitored during session;Ice applied;Repositioned    Home Living Family/patient expects to be discharged to:: Private residence Living Arrangements: Spouse/significant  other   Type of Home: House Home Access: Stairs to enter Entrance Stairs-Rails: None Entrance Stairs-Number of Steps: 1+1 Home Layout: Two level;Able to live on main level with bedroom/bathroom Home Equipment: Walker - 2 wheels      Prior Function Level of Independence: Independent               Hand Dominance        Extremity/Trunk Assessment   Upper Extremity Assessment: Defer to OT evaluation           Lower Extremity Assessment: LLE deficits/detail   LLE Deficits / Details: at least: hip flex 2+/5, hip abd/add 2/5, moves ankle well  Cervical / Trunk Assessment: Normal  Communication   Communication: No difficulties  Cognition Arousal/Alertness: Awake/alert Behavior During Therapy: WFL for tasks assessed/performed Overall Cognitive Status: Within Functional Limits for tasks assessed                      General Comments      Exercises        Assessment/Plan    PT Assessment Patient needs continued PT services  PT Diagnosis Difficulty walking;Acute pain   PT Problem List Decreased strength;Decreased range of motion;Decreased activity tolerance;Decreased balance;Decreased mobility;Decreased knowledge of use of DME;Pain  PT Treatment Interventions DME instruction;Gait training;Stair training;Functional mobility training;Therapeutic activities;Patient/family education;Balance training;Therapeutic exercise   PT Goals (Current goals can be found in the Care Plan section) Acute Rehab  PT Goals Patient Stated Goal: to be as independent as possible by d/c PT Goal Formulation: With patient/family Time For Goal Achievement: 04/16/15 Potential to Achieve Goals: Good    Frequency 7X/week   Barriers to discharge        Co-evaluation               End of Session Equipment Utilized During Treatment: Gait belt Activity Tolerance: Patient tolerated treatment well Patient left: in chair;with call bell/phone within reach;with chair alarm  set;with family/visitor present           Time: [id number] PT Time Calculation (min) (ACUTE ONLY): 15 min   Charges:   PT Evaluation $Initial PT Evaluation Tier I: 1 Procedure     PT G Codes:        Weston Anna, MPT Pager: [phone]

## 2015-04-09 NOTE — Anesthesia Preprocedure Evaluation (Addendum)
Anesthesia Evaluation  Patient identified by MRN, date of birth, ID band Patient awake    Reviewed: Allergy & Precautions, NPO status , Patient's Chart, lab work & pertinent test results  Airway Mallampati: II  TM Distance: >3 FB Neck ROM: Full    Dental no notable dental hx.    Pulmonary shortness of breath, asthma , former smoker,    Pulmonary exam normal breath sounds clear to auscultation       Cardiovascular hypertension, + Peripheral Vascular Disease  Normal cardiovascular exam Rhythm:Regular Rate:Normal     Neuro/Psych  Neuromuscular disease negative psych ROS   GI/Hepatic Neg liver ROS, GERD  ,  Endo/Other  negative endocrine ROS  Renal/GU negative Renal ROS  negative genitourinary   Musculoskeletal  (+) Arthritis ,   Abdominal   Peds negative pediatric ROS (+)  Hematology negative hematology ROS (+)   Anesthesia Other Findings   Reproductive/Obstetrics negative OB ROS                             Anesthesia Physical Anesthesia Plan  ASA: II  Anesthesia Plan: Spinal   Post-op Pain Management:    Induction: Intravenous  Airway Management Planned: Natural Airway  Additional Equipment:   Intra-op Plan:   Post-operative Plan:   Informed Consent: I have reviewed the patients History and Physical, chart, labs and discussed the procedure including the risks, benefits and alternatives for the proposed anesthesia with the patient or authorized representative who has indicated his/her understanding and acceptance.   Dental advisory given  Plan Discussed with: CRNA  Anesthesia Plan Comments: (Discussed risks and benefits of and differences between spinal and general. Discussed risks of spinal including headache, backache, failure, bleeding and hematoma, infection, and nerve damage. Patient consents to spinal. Questions answered. Coagulation studies and platelet count  acceptable.)       Anesthesia Quick Evaluation

## 2015-04-09 NOTE — Anesthesia Procedure Notes (Signed)
Spinal  Start time: 04/09/2015 8:50 AM End time: 04/09/2015 8:52 AM Staffing Anesthesiologist: Franne Grip Resident/CRNA: Lupita Raider F Performed by: resident/CRNA  Preanesthetic Checklist Completed: patient identified, site marked, surgical consent, pre-op evaluation, timeout performed, IV checked, risks and benefits discussed and monitors and equipment checked Spinal Block Patient position: sitting Prep: Betadine Patient monitoring: heart rate, cardiac monitor, continuous pulse ox and blood pressure Approach: midline Location: L3-4 Injection technique: single-shot Needle Needle type: Quincke  Needle gauge: 22 G Needle length: 5 cm Assessment Sensory level: T6 Additional Notes Patient positioned sitting. Sterile prep and drape. SAB placed x1 attempt without complications.Patient tolerated well.  Bupivicaine 0.75% with dextrose 1.75ml injected.  Patient positioned supine. Surgical level obtained.

## 2015-04-09 NOTE — Progress Notes (Signed)
Patient finished taking her antibiotic (Cipro) for UTI

## 2015-04-10 ENCOUNTER — Encounter (HOSPITAL_COMMUNITY): Payer: Self-pay | Admitting: Orthopedic Surgery

## 2015-04-10 DIAGNOSIS — E669 Obesity, unspecified: Secondary | ICD-10-CM | POA: Diagnosis present

## 2015-04-10 DIAGNOSIS — E66811 Obesity, class 1: Secondary | ICD-10-CM | POA: Diagnosis present

## 2015-04-10 LAB — CBC
HCT: 33.2 % — ABNORMAL LOW (ref 36.0–46.0)
HEMOGLOBIN: 11.4 g/dL — AB (ref 12.0–15.0)
MCH: 29.6 pg (ref 26.0–34.0)
MCHC: 34.3 g/dL (ref 30.0–36.0)
MCV: 86.2 fL (ref 78.0–100.0)
Platelets: 223 10*3/uL (ref 150–400)
RBC: 3.85 MIL/uL — ABNORMAL LOW (ref 3.87–5.11)
RDW: 12.8 % (ref 11.5–15.5)
WBC: 13.3 10*3/uL — ABNORMAL HIGH (ref 4.0–10.5)

## 2015-04-10 LAB — BASIC METABOLIC PANEL
Anion gap: 8 (ref 5–15)
BUN: 12 mg/dL (ref 6–20)
CHLORIDE: 107 mmol/L (ref 101–111)
CO2: 23 mmol/L (ref 22–32)
CREATININE: 0.69 mg/dL (ref 0.44–1.00)
Calcium: 8.3 mg/dL — ABNORMAL LOW (ref 8.9–10.3)
GFR calc non Af Amer: >60 mL/min (ref >60)
Glucose, Bld: 169 mg/dL — ABNORMAL HIGH (ref 65–99)
POTASSIUM: 4.6 mmol/L (ref 3.5–5.1)
Sodium: 138 mmol/L (ref 135–145)

## 2015-04-10 MED ORDER — POLYETHYLENE GLYCOL 3350 17 G PO PACK: 17.0000 g | PACK | Freq: Two times a day (BID) | ORAL | Status: DC

## 2015-04-10 MED ORDER — ASPIRIN 325 MG PO TBEC: 325.0000 mg | DELAYED_RELEASE_TABLET | Freq: Two times a day (BID) | ORAL | Status: AC

## 2015-04-10 MED ORDER — FERROUS SULFATE 325 (65 FE) MG PO TABS: 325.0000 mg | ORAL_TABLET | Freq: Three times a day (TID) | ORAL | Status: DC

## 2015-04-10 MED ORDER — HYDROCODONE-ACETAMINOPHEN 7.5-325 MG PO TABS: 1.0000 | ORAL_TABLET | ORAL | Status: DC | PRN

## 2015-04-10 MED ORDER — TIZANIDINE HCL 4 MG PO TABS: 4.0000 mg | ORAL_TABLET | Freq: Four times a day (QID) | ORAL | Status: DC | PRN

## 2015-04-10 MED ORDER — DOCUSATE SODIUM 100 MG PO CAPS: 100.0000 mg | ORAL_CAPSULE | Freq: Two times a day (BID) | ORAL | Status: DC

## 2015-04-10 NOTE — Progress Notes (Signed)
Physical Therapy Treatment Patient Details Name: Erin Roach MRN: [id number] DOB: 1940/06/07 Today's Date: 04/10/2015    History of Present Illness 74 yo female s/p L TKA 04/09/15.     PT Comments    Progressing with mobility. Pain ~5/10 during session. Will have 2nd session prior to d/c today.   Follow Up Recommendations  Home health PT;Supervision - Intermittent     Equipment Recommendations  None recommended by PT    Recommendations for Other Services       Precautions / Restrictions Precautions Precautions: Fall Restrictions Weight Bearing Restrictions: No LLE Weight Bearing: Weight bearing as tolerated    Mobility  Bed Mobility   Bed Mobility: Supine to Sit;Sit to Supine     Supine to sit: Supervision Sit to supine: Supervision      Transfers Overall transfer level: Needs assistance Equipment used: Rolling walker (2 wheeled) Transfers: Sit to/from Stand Sit to Stand: Min guard         General transfer comment: close guard for safety. VCs hand placement.   Ambulation/Gait Ambulation/Gait assistance: Min guard Ambulation Distance (Feet): 100 Feet Assistive device: Rolling walker (2 wheeled) Gait Pattern/deviations: Step-to pattern;Antalgic     General Gait Details: close guard for safety. VCs safety, sequence-pt has some minor difficulty with remembering sequence   Stairs Stairs: Yes   Stair Management: Forwards;With walker;Step to pattern Number of Stairs: 1 General stair comments: close guard for safety. VCS safety, sequence.   Wheelchair Mobility    Modified Rankin (Stroke Patients Only)       Balance                                    Cognition Arousal/Alertness: Awake/alert Behavior During Therapy: WFL for tasks assessed/performed Overall Cognitive Status: Within Functional Limits for tasks assessed                      Exercises Total Joint Exercises Ankle Circles/Pumps: AROM;Both;10  reps;Supine Quad Sets: AROM;Both;10 reps;Supine Hip ABduction/ADduction: AROM;Left;10 reps;Supine Straight Leg Raises: AROM;Left;10 reps;Supine Knee Flexion: AAROM;Left;10 reps;Seated Goniometric ROM: ~10-80 degrees    General Comments        Pertinent Vitals/Pain Pain Assessment: 0-10 Pain Score: 5  Pain Location: L knee with activity. No pain at rest. Pain Descriptors / Indicators: Sore Pain Intervention(s): Monitored during session;Ice applied;Repositioned    Home Living                      Prior Function            PT Goals (current goals can now be found in the care plan section) Progress towards PT goals: Progressing toward goals    Frequency  7X/week    PT Plan Current plan remains appropriate    Co-evaluation             End of Session Equipment Utilized During Treatment: Gait belt Activity Tolerance: Patient tolerated treatment well Patient left: in bed;with call bell/phone within reach     Time: [id number] PT Time Calculation (min) (ACUTE ONLY): 31 min  Charges:  $Gait Training: 8-22 mins $Therapeutic Exercise: 8-22 mins                    G Codes:      Weston Anna, MPT Pager: [phone]

## 2015-04-10 NOTE — Progress Notes (Signed)
Physical Therapy Treatment Patient Details Name: Erin Roach MRN: [id number] DOB: 10-28-1940 Today's Date: 04/10/2015    History of Present Illness 74 yo female s/p L TKA 04/09/15.     PT Comments    2nd session for gait training. Reviewed exercises and stair negotiation. Instructed pt in proper fit of RW-she is possibly going to borrow one from a friend. All education completed.   Follow Up Recommendations  Home health PT;Supervision - Intermittent     Equipment Recommendations  None recommended by PT    Recommendations for Other Services       Precautions / Restrictions Precautions Precautions: Fall Restrictions Weight Bearing Restrictions: No LLE Weight Bearing: Weight bearing as tolerated    Mobility  Bed Mobility Overal bed mobility: Needs Assistance Bed Mobility: Supine to Sit;Sit to Supine     Supine to sit: Supervision Sit to supine: Supervision   General bed mobility comments: for safety  Transfers Overall transfer level: Needs assistance Equipment used: Rolling walker (2 wheeled) Transfers: Sit to/from Stand Sit to Stand: Supervision         General transfer comment: for safety  Ambulation/Gait Ambulation/Gait assistance: Min guard Ambulation Distance (Feet): 115 Feet Assistive device: Rolling walker (2 wheeled) Gait Pattern/deviations: Step-to pattern;Antalgic     General Gait Details: close guard for safety.VCs sequence   Stairs Stairs:  (Verbally reviewed sequence)   Stair Management: Forwards;With walker;Step to pattern Number of Stairs: 1 General stair comments: close guard for safety. VCS safety, sequence.   Wheelchair Mobility    Modified Rankin (Stroke Patients Only)       Balance                                    Cognition Arousal/Alertness: Awake/alert Behavior During Therapy: WFL for tasks assessed/performed Overall Cognitive Status: Within Functional Limits for tasks assessed                       Exercises Total Joint Exercises Ankle Circles/Pumps: AROM;Both;10 reps;Supine Quad Sets: AROM;Both;10 reps;Supine Hip ABduction/ADduction: AROM;Left;10 reps;Supine Straight Leg Raises: AROM;Left;10 reps;Supine Knee Flexion: AAROM;Left;10 reps;Seated Goniometric ROM: ~10-80 degrees    General Comments        Pertinent Vitals/Pain Pain Assessment: 0-10 Pain Score: 7  Pain Location: L knee Pain Descriptors / Indicators: Sore Pain Intervention(s): Premedicated before session;Repositioned    Home Living Family/patient expects to be discharged to:: Private residence Living Arrangements: Spouse/significant other             Additional Comments: will borrow 3:1    Prior Function Level of Independence: Independent          PT Goals (current goals can now be found in the care plan section) Acute Rehab PT Goals Patient Stated Goal: to be as independent as possible by d/c Progress towards PT goals: Progressing toward goals    Frequency  7X/week    PT Plan Current plan remains appropriate    Co-evaluation             End of Session Equipment Utilized During Treatment: Gait belt Activity Tolerance: Patient tolerated treatment well Patient left: in bed;with call bell/phone within reach     Time: 1419-1434 PT Time Calculation (min) (ACUTE ONLY): 15 min  Charges:  $Gait Training: 8-22 mins $Therapeutic Exercise: 8-22 mins  G Codes:      Weston Anna, MPT Pager: [phone]

## 2015-04-10 NOTE — Evaluation (Signed)
Occupational Therapy Evaluation Patient Details Name: Erin Roach MRN: [id number] DOB: 1940/10/26 Today's Date: 04/10/2015    History of Present Illness 74 yo female s/p L TKA 04/09/15.    Clinical Impression   Pt was admitted for the above surgery. Will return one more time to further educate on bathroom transfers.  Pt completed ADL with min A.  Goal for toilet transfer is supervision and shower transfer is min guard A.    Follow Up Recommendations  No OT follow up;Supervision/Assistance - 24 hour    Equipment Recommendations  None recommended by OT    Recommendations for Other Services       Precautions / Restrictions Precautions Precautions: Fall Restrictions Weight Bearing Restrictions: No LLE Weight Bearing: Weight bearing as tolerated      Mobility Bed Mobility   Bed Mobility: Supine to Sit;Sit to Supine     Supine to sit: Supervision Sit to supine: Supervision      Transfers Overall transfer level: Needs assistance Equipment used: Rolling walker (2 wheeled) Transfers: Sit to/from Stand Sit to Stand: Min guard         General transfer comment: close guard for safety. VCs hand placement.     Balance                                            ADL Overall ADL's : Needs assistance/impaired     Grooming: Set up;Wash/dry hands;Wash/dry face;Sitting   Upper Body Bathing: Set up;Sitting   Lower Body Bathing: Minimal assistance;Sit to/from stand   Upper Body Dressing : Set up;Sitting   Lower Body Dressing: Minimal assistance;Sit to/from stand                 General ADL Comments: completed ADL from EOB, sit to stand. Will return to practice shower transfer when pain decreases.  Pt may benefit from long netted sponge.  Husband will assist as needed     Vision     Perception     Praxis      Pertinent Vitals/Pain Pain Assessment: 0-10 Pain Score: 5  Pain Location: L knee Pain Descriptors / Indicators:  Sore Pain Intervention(s): Monitored during session;Limited activity within patient's tolerance;RN gave pain meds during session;Repositioned     Hand Dominance     Extremity/Trunk Assessment Upper Extremity Assessment Upper Extremity Assessment: Overall WFL for tasks assessed           Communication Communication Communication: No difficulties   Cognition Arousal/Alertness: Awake/alert Behavior During Therapy: WFL for tasks assessed/performed Overall Cognitive Status: Within Functional Limits for tasks assessed                     General Comments       Exercises       Shoulder Instructions      Home Living Family/patient expects to be discharged to:: Private residence Living Arrangements: Spouse/significant other                 Bathroom Shower/Tub: Occupational psychologist: Standard         Additional Comments: will borrow 3:1      Prior Functioning/Environment Level of Independence: Independent             OT Diagnosis: Acute pain   OT Problem List: Decreased strength;Decreased activity tolerance;Decreased knowledge of use of DME or AE;Pain  OT Treatment/Interventions: Self-care/ADL training;DME and/or AE instruction;Patient/family education    OT Goals(Current goals can be found in the care plan section) Acute Rehab OT Goals Patient Stated Goal: to be as independent as possible by d/c OT Goal Formulation: With patient Time For Goal Achievement: 04/17/15 Potential to Achieve Goals: Good ADL Goals Pt Will Transfer to Toilet: with supervision;ambulating;bedside commode Pt Will Perform Tub/Shower Transfer: Shower transfer;3 in 1;with min guard assist;ambulating  OT Frequency: Min 2X/week   Barriers to D/C:            Co-evaluation              End of Session    Activity Tolerance: Patient tolerated treatment well Patient left: with call bell/phone within reach;in bed   Time: 1030-1052 OT Time Calculation  (min): 22 min Charges:  OT General Charges $OT Visit: 1 Procedure OT Evaluation $Initial OT Evaluation Tier I: 1 Procedure G-Codes:    Mike Hamre 2015-04-19, 11:59 AM  Lesle Chris, OTR/L [phone] April 19, 2015

## 2015-04-10 NOTE — Progress Notes (Signed)
   04/10/15 1400  OT Visit Information  Last OT Received On 04/10/15  Assistance Needed +1  History of Present Illness 74 yo female s/p L TKA 04/09/15.   OT Time Calculation  OT Start Time (ACUTE ONLY) 1340  OT Stop Time (ACUTE ONLY) 1353  OT Time Calculation (min) 13 min  Precautions  Precautions Fall  Pain Assessment  Pain Score 3  Pain Location L knee  Pain Descriptors / Indicators Sore  Pain Intervention(s) Limited activity within patient's tolerance;Monitored during session  Cognition  Arousal/Alertness Awake/alert  Behavior During Therapy WFL for tasks assessed/performed  Overall Cognitive Status Within Functional Limits for tasks assessed  ADL  Lower Body Dressing Supervision/safety;Sit to/from stand (pants and underwear)  Toilet Transfer Supervision/safety;Ambulation;BSC  Tub/ Engineer, maintenance;Ambulation;Walk-in shower  General ADL Comments cues for UE placement for safety  Restrictions  LLE Weight Bearing WBAT  Transfers  Transfers Sit to/from Stand  Sit to Stand Supervision  General transfer comment cues for UE placement  OT - End of Session  Activity Tolerance Patient tolerated treatment well  Patient left with call bell/phone within reach;in bed  OT Assessment/Plan  Follow Up Recommendations No OT follow up;Supervision/Assistance - 24 hour  OT Equipment None recommended by OT  OT Goal Progression  Progress towards OT goals Goals met/education completed, patient discharged from [place] Charges  $OT Visit 1 Procedure  OT Treatments  $Self Care/Home Management  8-22 mins  Lesle Chris, OTR/L [phone] 04/10/2015

## 2015-04-10 NOTE — Care Management Note (Signed)
Case Management Note  Patient Details  Name: Erin Roach MRN: [id number] Date of Birth: 1940/05/21  Subjective/Objective:     S/p left total knee arthroplasty               Action/Plan: Discharge planning, has chosen [place] for [facility] services, no DME needs. Contacted [facility] Go for referral.  Expected Discharge Date:                  Expected Discharge Plan:  [place]  In-House Referral:  NA  Discharge planning Services  CM Consult  Post Acute Care Choice:  Home Health Choice offered to:  Patient  DME Arranged:  N/A DME Agency:  NA  HH Arranged:  PT HH Agency:  [place]  Status of Service:  Completed, signed off  Medicare Important Message Given:    Date Medicare IM Given:    Medicare IM give by:    Date Additional Medicare IM Given:    Additional Medicare Important Message give by:     If discussed at [place] of Stay Meetings, dates discussed:    Additional Comments:  Guadalupe Maple, RN 04/10/2015, 10:28 AM

## 2015-04-10 NOTE — Progress Notes (Signed)
     Subjective: 1 Day Post-Op Procedure(s) (LRB): LEFT TOTAL KNEE ARTHROPLASTY (Left)   Patient reports pain as mild, pain controlled. No events throughout the night. Ready to be discharged home if she does well with PT and pain stays controlled.   Objective:   VITALS:   Filed Vitals:   04/10/15 0110 04/10/15 0503  BP: 133/57 130/74  Pulse: 89 91  Temp: 97.9 F (36.6 C) 97.8 F (36.6 C)  Resp: 16 16    Dorsiflexion/Plantar flexion intact Incision: dressing C/D/I No cellulitis present Compartment soft  LABS  Recent Labs  04/10/15 0450  HGB 11.4*  HCT 33.2*  WBC 13.3*  PLT 223     Recent Labs  04/10/15 0450  NA 138  K 4.6  BUN 12  CREATININE 0.69  GLUCOSE 169*     Assessment/Plan: 1 Day Post-Op Procedure(s) (LRB): LEFT TOTAL KNEE ARTHROPLASTY (Left) Foley cath d/c'ed Advance diet Up with therapy D/C IV fluids Discharge home with home health  Follow up in 2 weeks at [facility]. Follow up with OLIN,Melvenia Favela D in 2 weeks.  Contact information:  [facility] [address] [place] [id number]    Obese (BMI 30-39.9) Estimated body mass index is 32.28 kg/(m^2) as calculated from the following:   Height as of this encounter: 5' 6.5" (1.689 m).   Weight as of this encounter: 92.08 kg (203 lb). Patient also counseled that weight may inhibit the healing process Patient counseled that losing weight will help with future health issues         West Pugh. Javonn Gauger   PAC  04/10/2015, 8:43 AM

## 2015-04-10 NOTE — Progress Notes (Signed)
Discharge instructions given to patient.

## 2015-04-12 ENCOUNTER — Telehealth: Payer: Self-pay | Admitting: *Deleted

## 2015-04-12 NOTE — Discharge Summary (Signed)
Physician Discharge Summary  Patient ID: Erin Roach MRN: [id number] DOB/AGE: 72-May-1942 72 y.o.  Admit date: 04/09/2015 Discharge date: 04/10/2015   Procedures:  Procedure(s) (LRB): LEFT TOTAL KNEE ARTHROPLASTY (Left)  Attending Physician:  Dr. Paralee Cancel   Admission Diagnoses:   Left knee primary OA / pain  Discharge Diagnoses:  Active Problems:   S/P left TKA   Obese  Past Medical History  Diagnosis Date  . Venous insufficiency     legs  . Leg edema     bilateral  . RLS (restless legs syndrome)   . Esophageal reflux   . Urinary incontinence   . Skin cancer     hx of squamous cell buttocks area, Yevette Edwards MD,Gen Surgery: Amy Martinique MD, derm  . Asthma   . Snoring     no sleep study  . Heart murmur   . Hypertension   . Arthritis     HPI:    Erin Roach, 74 y.o. female, has a history of pain and functional disability in the left knee due to arthritis and has failed non-surgical conservative treatments for greater than 12 weeks to include NSAID's and/or analgesics, corticosteriod injections, use of assistive devices and activity modification. Onset of symptoms was gradual, starting 2+ months ago with rapidlly worsening course since that time. The patient noted no past surgery on the left knee(s). Patient currently rates pain in the left knee(s) at 9 out of 10 with activity. Patient has night pain, worsening of pain with activity and weight bearing, pain that interferes with activities of daily living, pain with passive range of motion, crepitus and joint swelling. Patient has evidence of periarticular osteophytes and joint space narrowing by imaging studies. There is no active infection. Risks, benefits and expectations were discussed with the patient. Risks including but not limited to the risk of anesthesia, blood clots, nerve damage, blood vessel damage, failure of the prosthesis, infection and up to and including death. Patient understand the risks,  benefits and expectations and wishes to proceed with surgery.   PCP: Unice Cobble, MD   Discharged Condition: good  Hospital Course:  Patient underwent the above stated procedure on 04/09/2015. Patient tolerated the procedure well and brought to the recovery room in good condition and subsequently to the floor.  POD #1 BP: 130/74 ; Pulse: 91 ; Temp: 97.8 F (36.6 C) ; Resp: 16 Patient reports pain as mild, pain controlled. No events throughout the night. Ready to be discharged home. Dorsiflexion/plantar flexion intact, incision: dressing C/D/I, no cellulitis present and compartment soft.   LABS  Basename    HGB     11.4  HCT     33.2    Discharge Exam: General appearance: alert, cooperative and no distress Extremities: Homans sign is negative, no sign of DVT, no edema, redness or tenderness in the calves or thighs and no ulcers, gangrene or trophic changes  Disposition: Home with follow up in 2 weeks   Follow-up Information    Follow up with Mauri Pole, MD. Schedule an appointment as soon as possible for a visit in 2 weeks.   Specialty:  Orthopedic Surgery   Contact information:   [address] [place] [address] [phone]       Follow up with [facility].   Why:  physical therapy   Contact information:   [place]  [address] [phone]       Discharge Instructions    Call MD /  Call 911    Complete by:  As directed   If you experience chest pain or shortness of breath, CALL 911 and be transported to the hospital emergency room.  If you develope a fever above 101 F, pus (white drainage) or increased drainage or redness at the wound, or calf pain, call your surgeon's office.     Change dressing    Complete by:  As directed   Maintain surgical dressing until follow up in the clinic. If the edges start to pull up, may reinforce with tape. If the dressing is no longer working, may remove and cover with  gauze and tape, but must keep the area dry and clean.  Call with any questions or concerns.     Constipation Prevention    Complete by:  As directed   Drink plenty of fluids.  Prune juice may be helpful.  You may use a stool softener, such as Colace (over the counter) 100 mg twice a day.  Use MiraLax (over the counter) for constipation as needed.     Diet - low sodium heart healthy    Complete by:  As directed      Discharge instructions    Complete by:  As directed   Maintain surgical dressing until follow up in the clinic. If the edges start to pull up, may reinforce with tape. If the dressing is no longer working, may remove and cover with gauze and tape, but must keep the area dry and clean.  Follow up in 2 weeks at [facility]. Call with any questions or concerns.     Increase activity slowly as tolerated    Complete by:  As directed   Weight bearing as tolerated with assist device (walker, cane, etc) as directed, use it as long as suggested by your surgeon or therapist, typically at least 4-6 weeks.     TED hose    Complete by:  As directed   Use stockings (TED hose) for 2 weeks on both leg(s).  You may remove them at night for sleeping.             Medication List    STOP taking these medications        HYDROcodone-acetaminophen 5-325 MG tablet  Commonly known as:  NORCO/VICODIN  Replaced by:  HYDROcodone-acetaminophen 7.5-325 MG tablet     oxyCODONE-acetaminophen 5-325 MG tablet  Commonly known as:  PERCOCET/ROXICET      TAKE these medications        aspirin 325 MG EC tablet  Take 1 tablet (325 mg total) by mouth 2 (two) times daily.     clonazePAM 0.5 MG tablet  Commonly known as:  KLONOPIN  Take 1 tablet (0.5 mg total) by mouth at bedtime. Three times a week.     docusate sodium 100 MG capsule  Commonly known as:  COLACE  Take 1 capsule (100 mg total) by mouth 2 (two) times daily.     ferrous sulfate 325 (65 FE) MG tablet  Take 1 tablet (325 mg  total) by mouth 3 (three) times daily after meals.     fluticasone-salmeterol 115-21 MCG/ACT inhaler  Commonly known as:  ADVAIR HFA  Inhale 2 puffs into the lungs 2 (two) times daily as needed (for asthma symptoms).     HYDROcodone-acetaminophen 7.5-325 MG tablet  Commonly known as:  NORCO  Take 1-2 tablets by mouth every 4 (four) hours as needed for moderate pain.     mirabegron ER 25 MG Tb24  tablet  Commonly known as:  MYRBETRIQ  Take 2 tablets (50 mg total) by mouth daily.     multivitamin with minerals Tabs tablet  Take 1 tablet by mouth daily.     polyethylene glycol packet  Commonly known as:  MIRALAX / GLYCOLAX  Take 17 g by mouth 2 (two) times daily.     pramipexole 0.25 MG tablet  Commonly known as:  MIRAPEX  Take 2 tablet in the afternoon and 2 tablets after dinner.     pregabalin 75 MG capsule  Commonly known as:  LYRICA  1 afternoon, 2 evening     simvastatin 20 MG tablet  Commonly known as:  ZOCOR  Take 1 tablet (20 mg total) by mouth at bedtime.     spironolactone 25 MG tablet  Commonly known as:  ALDACTONE  Take 1 tablet (25 mg total) by mouth 2 (two) times daily.     tiZANidine 4 MG tablet  Commonly known as:  ZANAFLEX  Take 1 tablet (4 mg total) by mouth every 6 (six) hours as needed for muscle spasms.         Signed: West Pugh. Milanie Rosenfield   PA-C  04/12/2015, 7:57 AM

## 2015-04-12 NOTE — Telephone Encounter (Signed)
Pt was on TCM lis had (L) knee replacement. Pt will be f/u with surgeon Dr. Honor Loh in 2 weeks...Johny Chess

## 2015-04-16 ENCOUNTER — Ambulatory Visit: Payer: Medicare Other | Admitting: Internal Medicine

## 2015-04-18 ENCOUNTER — Ambulatory Visit: Payer: Medicare Other | Admitting: Pulmonary Disease

## 2015-04-19 ENCOUNTER — Telehealth: Payer: Self-pay | Admitting: Internal Medicine

## 2015-04-19 NOTE — Telephone Encounter (Signed)
Called pt and gave MD response below.  Pt stated understanding.

## 2015-04-19 NOTE — Telephone Encounter (Signed)
Minimal Blood Pressure Goal= AVERAGE < 140/90; your BP is just lower than this. Ideal is an AVERAGE < 135/85. This AVERAGE should be calculated from @ least 5-7 BP readings taken @ different times of day on different days of week. You should not respond to isolated BP readings , but rather the AVERAGE for that week .  Fill the  prescription for Metoprolol 25 mg bid #60 if BP NOT @ goal based on  7 to 14 day average.

## 2015-04-19 NOTE — Telephone Encounter (Signed)
Patient states that she has recently had knee surgery and she has had a PT come to home.  States that her blood pressure has been high every time they come in to check during PT visit.  Last readings are as follow:  12/9 - 138/87, 12/7 - 135/88, 12/5- 138/89, 12/2- 138/89.  Patient would like to know what she needs to do in regards?  States she is having a hard time getting around because of her knee surgery so it would be hard to come into the office.

## 2015-04-24 ENCOUNTER — Telehealth: Payer: Self-pay | Admitting: Pulmonary Disease

## 2015-04-24 NOTE — Telephone Encounter (Signed)
That's fine. No refills. Warn her again not to use with Ambien or any other sedating medications.

## 2015-04-24 NOTE — Telephone Encounter (Signed)
Spoke with pt. She needs a refill on Clonazepam. RA gave this for RLS. States that she would just like a 30 day supply. This has last filled by RA on 02/27/15 #30.  JN - would you be willing to refill this in RA's absence. Thanks.

## 2015-04-25 MED ORDER — CLONAZEPAM 0.5 MG PO TABS: ORAL_TABLET | ORAL | Status: DC

## 2015-04-25 NOTE — Telephone Encounter (Signed)
Pt is aware of JN's recommendation. Rx has been called in. Nothing further was needed.

## 2015-04-29 ENCOUNTER — Ambulatory Visit: Payer: Medicare Other | Admitting: Adult Health

## 2015-05-08 ENCOUNTER — Telehealth: Payer: Self-pay | Admitting: Pulmonary Disease

## 2015-05-08 MED ORDER — HYDROCODONE-ACETAMINOPHEN 5-325 MG PO TABS: 1.0000 | ORAL_TABLET | Freq: Every day | ORAL | Status: DC

## 2015-05-08 NOTE — Telephone Encounter (Signed)
Pt requesting refill on hydrocodone.  Last refill 7.5/325mg  #100 with 0 refills-take 1-2 tabs q4h prn pain.  Filled on 04/10/15 with 0 refills.  Pt last seen 02/27/2015 by RA-former [facility] pt.  Pt will pick up this rx in office.   Sending refill request to DOD as RA is unavailable to authorize or sign for refill.  MW please advise if you're ok with this refill.  Thanks!

## 2015-05-08 NOTE — Telephone Encounter (Signed)
Ok x one but send this to Dr Elsworth Soho next to determine who is responsible for refills going forward

## 2015-05-08 NOTE — Telephone Encounter (Signed)
Erin Roach was prescribing norco 5-325mg  1 po qhs for RLS. Called spoke with pt. Confirmed this is the dosage needed. RX printed off for MW to sign.  Pt will pick up later this afternoon.

## 2015-05-23 ENCOUNTER — Encounter: Payer: Self-pay | Admitting: Internal Medicine

## 2015-05-23 ENCOUNTER — Ambulatory Visit (INDEPENDENT_AMBULATORY_CARE_PROVIDER_SITE_OTHER): Payer: Medicare Other | Admitting: Internal Medicine

## 2015-05-23 VITALS — BP 132/82 | HR 93 | Temp 98.7°F | Resp 16 | Wt 199.0 lb

## 2015-05-23 DIAGNOSIS — R600 Localized edema: Secondary | ICD-10-CM

## 2015-05-23 DIAGNOSIS — E785 Hyperlipidemia, unspecified: Secondary | ICD-10-CM | POA: Diagnosis not present

## 2015-05-23 DIAGNOSIS — G2581 Restless legs syndrome: Secondary | ICD-10-CM | POA: Diagnosis not present

## 2015-05-23 MED ORDER — FUROSEMIDE 20 MG PO TABS: 20.0000 mg | ORAL_TABLET | Freq: Every day | ORAL | Status: DC

## 2015-05-23 NOTE — Progress Notes (Signed)
Subjective:    Patient ID: Erin Roach, female    DOB: 30-Oct-1940, 75 y.o.   MRN: [id number]  HPI   She is here to establish with a new pcp and for follow up.  She has no concerns.    She had a left total knee replacement 6 weeks ago. She was given tizanidine take as needed, but has not been taking it regularly. She is currently doing physical therapy feels she is recovering well.   Severe restless leg syndrome: She sees Dr Tat and pulmonary.  She takes mirapex, lyrica, low dose hydrocodone and clonazepam as needed. Feels this cocktail has worked fairly well.      Hyperlipidemia: She is taking her medication daily. She is compliant with a low fat/cholesterol diet. She is not exercising regularly since she is recovering from her knee replacement. She denies myalgias.   Asthma: She has a prescription for Advair, but does not use it regularly. She has been receiving allergy injections for a long time, but recently stopped because of her knee surgery. She is currently asymptomatic. She does follow with pulmonary.  Venous insufficiency, bilateral leg edema: For years she has been experiencing bilateral leg edema secondary to severe venous insufficiency. She has had these removed in the past and has seen vascular. She is currently taking spironolactone daily and has been on medication for years. She does not feel that it works well, but it does help. She denies being on any other medication in the past. The degree of swelling is related to her activity level. It is worse when she is on her feet for long periods of time. She is compliant with a low sodium diet. She has not been able to exercise regularly for a while because of her knee.   Medications and allergies reviewed with patient and updated if appropriate.  Patient Active Problem List   Diagnosis Date Noted  . Obese 04/10/2015  . S/P left TKA 04/09/2015  . Hyperglycemia 01/09/2015  . Restless leg 12/19/2013  . Osteopenia 12/04/2012   . GERD (gastroesophageal reflux disease) 02/07/2012  . Piriformis syndrome 01/07/2011  . FOOT PAIN, BILATERAL 07/17/2010  . ELEVATED BLOOD PRESSURE WITHOUT DIAGNOSIS OF HYPERTENSION 01/15/2010  . 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 RESTLESS LEG SYNDROME 02/11/2009  . LEG EDEMA, BILATERAL 02/11/2009  . Hyperlipidemia 10/30/2008  . URINARY INCONTINENCE 10/30/2008  . SKIN CANCER, HX OF 10/30/2008  . Asthma 11/21/2007  . VENOUS INSUFFICIENCY, LEGS 11/22/2006  . SNORING 11/22/2006    Current Outpatient Prescriptions on File Prior to Visit  Medication Sig Dispense Refill  . clonazePAM (KLONOPIN) 0.5 MG tablet Three times a week as needed for restless leg 30 tablet 0  . docusate sodium (COLACE) 100 MG capsule Take 1 capsule (100 mg total) by mouth 2 (two) times daily. 10 capsule 0  . ferrous sulfate 325 (65 FE) MG tablet Take 1 tablet (325 mg total) by mouth 3 (three) times daily after meals.  3  . fluticasone-salmeterol (ADVAIR HFA) 115-21 MCG/ACT inhaler Inhale 2 puffs into the lungs 2 (two) times daily as needed (for asthma symptoms).    [name] HYDROcodone-acetaminophen (NORCO) 5-325 MG tablet Take 1 tablet by mouth at bedtime. 30 tablet 0  . mirabegron ER (MYRBETRIQ) 25 MG TB24 tablet Take 2 tablets (50 mg total) by mouth daily. (Patient taking differently: Take 50 mg by mouth at bedtime. ) 60 tablet 5  . Multiple Vitamin (MULTIVITAMIN WITH MINERALS) TABS tablet Take 1 tablet by mouth daily.    [name]  polyethylene glycol (MIRALAX / GLYCOLAX) packet Take 17 g by mouth 2 (two) times daily. 14 each 0  . pramipexole (MIRAPEX) 0.25 MG tablet Take 2 tablet in the afternoon and 2 tablets after dinner. (Patient taking differently: Take 0.5 mg by mouth 2 (two) times daily. Take 2 tablet with dinner and 2 tablets at bedtime) 360 tablet 3  . pregabalin (LYRICA) 75 MG capsule 1 afternoon, 2 evening (Patient taking differently: Take 75-150 mg by mouth 2 (two) times daily. 75 mg with dinner and 150 mg at bedtime) 270  capsule 3  . simvastatin (ZOCOR) 20 MG tablet Take 1 tablet (20 mg total) by mouth at bedtime. 90 tablet 1  . spironolactone (ALDACTONE) 25 MG tablet Take 1 tablet (25 mg total) by mouth 2 (two) times daily. (Patient taking differently: Take 25 mg by mouth daily. ) 180 tablet 3  . tiZANidine (ZANAFLEX) 4 MG tablet Take 1 tablet (4 mg total) by mouth every 6 (six) hours as needed for muscle spasms. 40 tablet 0  . [DISCONTINUED] VESICARE 10 MG tablet TAKE 1 TABLET EVERY DAY 30 tablet 0   No current facility-administered medications on file prior to visit.    Past Medical History  Diagnosis Date  . Venous insufficiency     legs  . Leg edema     bilateral  . RLS (restless legs syndrome)   . Esophageal reflux   . Urinary incontinence   . Skin cancer     hx of squamous cell buttocks area, Yevette Edwards MD,Gen Surgery: Amy Martinique MD, derm  . Asthma   . Snoring     no sleep study  . Heart murmur   . Hypertension   . Arthritis     Past Surgical History  Procedure Laterality Date  . Appendectomy    . Tonsillectomy    . Skin cancer removal      squamous cell interglureal space  . Ulnar nerve surgery      04/20/13:  pt denies  . Colonoscopy      neg; Dr Collene Mares  . Cystoscopy  2004    ? negative, Dr Jeffie Pollock  . Total knee arthroplasty Left 04/09/2015    Procedure: LEFT TOTAL KNEE ARTHROPLASTY;  Surgeon: Paralee Cancel, MD;  Location: [facility];  Service: Orthopedics;  Laterality: Left;    Social History   Social History  . Marital Status: Married    Spouse Name: N/A  . Number of Children: N/A  . Years of Education: N/A   Occupational History  . retired Pharmacist, hospital    Social History Main Topics  . Smoking status: Former Smoker -- 0.50 packs/day for 15 years    Types: Cigarettes  . Smokeless tobacco: Never Used     Comment: smoked age [age], up to 1 ppd  . Alcohol Use: 1.8 oz/week    3 Glasses of wine per week     Comment: socially  . Drug Use: No  . Sexual Activity: Not Asked    Other Topics Concern  . None   Social History Narrative   2 children ages 45,35   Regular exercise no    Family History  Problem Relation Age of Onset  . Esophageal cancer Father   . Hyperlipidemia Mother   . Stroke Maternal Grandmother 66  . Diabetes Maternal Grandmother   . Heart attack Maternal Grandfather 93  . COPD Maternal Grandfather   . Heart attack Paternal Grandmother     34s  . Diabetes  MGGM    Review of Systems  Constitutional: Negative for fever and chills.  Respiratory: Negative for shortness of breath.   Cardiovascular: Positive for leg swelling. Negative for chest pain and palpitations.  Neurological: Positive for headaches (Mild, occasional). Negative for dizziness and light-headedness.       Objective:   Filed Vitals:   05/23/15 1104  BP: 132/82  Pulse: 93  Temp: 98.7 F (37.1 C)  Resp: 16   Filed Weights   05/23/15 1104  Weight: 199 lb (90.266 kg)   Body mass index is 31.64 kg/(m^2).   Physical Exam Constitutional: Appears well-developed and well-nourished. No distress.  Neck: Neck supple. No tracheal deviation present. No thyromegaly present.  No cervical adenopathy.   Cardiovascular: Normal rate, regular rhythm and normal heart sounds.   No murmur heard.  1+ non-pitting edema (mild for her) Pulmonary/Chest: Effort normal and breath sounds normal. No respiratory distress. No wheezes.       Assessment & Plan:   See Problem List for Assessment and Plan of chronic medical problems.   F/u annually, sooner if needed

## 2015-05-23 NOTE — Assessment & Plan Note (Signed)
Fairly controlled Following with pulmonary and Dr.Tat Currently taking low-dose hydrocodone, Mirapex, clonazepam and Lyrica

## 2015-05-23 NOTE — Progress Notes (Signed)
Pre visit review using our clinic review tool, if applicable. No additional management support is needed unless otherwise documented below in the visit note. 

## 2015-05-23 NOTE — Assessment & Plan Note (Signed)
Taking simvastatin daily Lipid panel up-to-date Follow-up annually

## 2015-05-23 NOTE — Patient Instructions (Signed)
Medications reviewed and updated. Changes include discontinuing the spironolactone and trying lasix for your leg edema.  Your prescription(s) have been submitted to your pharmacy. Please take as directed and contact our office if you believe you are having problem(s) with the medication(s)  Test(s) ordered today. Your results will be released to [place] (or called to you) after review, usually within 72hours after test completion. If any changes need to be made, you will be notified at that same time.

## 2015-05-23 NOTE — Assessment & Plan Note (Signed)
Wears compression stockings daily Compliant with low-sodium diet Will start regular exercise after recovering from knee replacement Has been on spironolactone for years and it helps, but she would ideally like something more effective We will try Lasix 20 mg daily-check CMP 1 and 2 weeks after starting medication

## 2015-05-30 ENCOUNTER — Ambulatory Visit: Payer: Medicare Other | Admitting: Neurology

## 2015-05-31 ENCOUNTER — Ambulatory Visit: Payer: Medicare Other | Admitting: Neurology

## 2015-06-05 ENCOUNTER — Other Ambulatory Visit: Payer: Self-pay | Admitting: Neurology

## 2015-06-05 MED ORDER — PRAMIPEXOLE DIHYDROCHLORIDE 0.25 MG PO TABS: ORAL_TABLET | ORAL | Status: DC

## 2015-06-05 NOTE — Telephone Encounter (Signed)
Mirapex refill requested. Per last office note- patient to remain on medication. Refill approved and sent to patient's pharmacy.   

## 2015-06-10 ENCOUNTER — Telehealth: Payer: Self-pay | Admitting: Pulmonary Disease

## 2015-06-10 MED ORDER — HYDROCODONE-ACETAMINOPHEN 5-325 MG PO TABS: 1.0000 | ORAL_TABLET | Freq: Every day | ORAL | Status: DC

## 2015-06-10 NOTE — Telephone Encounter (Signed)
Pt requesting a refill of her Hydrocodone(Norco) 5-325mg  - 1po qhs for RLS, #30 x 0 refills. Last filled 05/08/15 by Dr Melvyn Novas in Dr Bari Mantis absence.  Please advise Dr Melvyn Novas if you are okay with refilling this as Dr Elsworth Soho is not in the office to sign. Thanks.

## 2015-06-10 NOTE — Telephone Encounter (Signed)
She needs a fu OV withi the next month to discuss

## 2015-06-10 NOTE — Telephone Encounter (Signed)
Will send to Dr Elsworth Soho to advise if he will continue to prescribe Hydrocodone for the patients RLS. This is just an FYI for upcoming refills as medication was already refilled for this month. No need to contact the patient back.

## 2015-06-10 NOTE — Telephone Encounter (Addendum)
Pt aware that Rx ready to be picked up-placed up front. Aware that ALL future refills must come from Primary Pulmonary Physician (Dr Elsworth Soho). Expressed understanding. Nothing further needed.

## 2015-06-10 NOTE — Telephone Encounter (Signed)
I requested Dr Elsworth Soho approve the next one as I'm not familiar with this indication.  Give her one more but be sure Dr Elsworth Soho approves going forward

## 2015-06-17 ENCOUNTER — Other Ambulatory Visit: Payer: Self-pay | Admitting: Neurology

## 2015-06-17 MED ORDER — PREGABALIN 75 MG PO CAPS: ORAL_CAPSULE | ORAL | Status: DC

## 2015-06-17 NOTE — Telephone Encounter (Signed)
Lyrica refill requested. Per last office note- patient to remain on medication. Refill approved and sent to patient's pharmacy.

## 2015-06-19 LAB — HM MAMMOGRAPHY: HM Mammogram: NEGATIVE

## 2015-06-21 ENCOUNTER — Other Ambulatory Visit (INDEPENDENT_AMBULATORY_CARE_PROVIDER_SITE_OTHER): Payer: Medicare Other

## 2015-06-21 DIAGNOSIS — R600 Localized edema: Secondary | ICD-10-CM

## 2015-06-21 LAB — COMPREHENSIVE METABOLIC PANEL
ALBUMIN: 4.2 g/dL (ref 3.5–5.2)
ALT: 25 U/L (ref 0–35)
AST: 21 U/L (ref 0–37)
Alkaline Phosphatase: 75 U/L (ref 39–117)
BUN: 15 mg/dL (ref 6–23)
CHLORIDE: 105 meq/L (ref 96–112)
CO2: 28 meq/L (ref 19–32)
CREATININE: 0.62 mg/dL (ref 0.40–1.20)
Calcium: 9.4 mg/dL (ref 8.4–10.5)
GFR: 100.44 mL/min (ref >60.00)
GLUCOSE: 86 mg/dL (ref 70–99)
Potassium: 3.6 mEq/L (ref 3.5–5.1)
SODIUM: 142 meq/L (ref 135–145)
Total Bilirubin: 0.3 mg/dL (ref 0.2–1.2)
Total Protein: 7.3 g/dL (ref 6.0–8.3)

## 2015-06-27 ENCOUNTER — Encounter: Payer: Self-pay | Admitting: Internal Medicine

## 2015-06-28 ENCOUNTER — Other Ambulatory Visit: Payer: Self-pay | Admitting: Internal Medicine

## 2015-07-04 ENCOUNTER — Encounter: Payer: Self-pay | Admitting: Neurology

## 2015-07-04 ENCOUNTER — Telehealth: Payer: Self-pay | Admitting: Neurology

## 2015-07-04 ENCOUNTER — Ambulatory Visit (INDEPENDENT_AMBULATORY_CARE_PROVIDER_SITE_OTHER): Payer: Medicare Other | Admitting: Neurology

## 2015-07-04 VITALS — BP 122/74 | HR 98 | Ht 66.5 in | Wt 199.0 lb

## 2015-07-04 DIAGNOSIS — G2581 Restless legs syndrome: Secondary | ICD-10-CM

## 2015-07-04 NOTE — Progress Notes (Signed)
Erin Roach was seen today in neurologic consultation at the request of Binnie Rail, MD.  The consultation is for the evaluation of RLS.  This is a second opinion.  The patient has seen Dr. Gwenette Greet and I reviewed his records.  The patient has had restless leg syndrome about 40 years ago, when she was pregnant. Over the years, it came and went but it has been "bad" for the last 20 + years.  She has been on multiple medications.  She has been on Requip, clonazepam, Neupro (site reaction) and Horizant.  She is now on pramipexole, 0.25 mg, 2 tablets at bedtime.   She tried quinine a long time ago and it worked great but it is no longer available for RLS.  She also uses Vicodin for restless leg syndrome.  She takes one at night.  It helps.  Her grandfather and father had similar.    She reports that she has most of the symptoms at night but sometimes during the day.  She has a burning pain in the legs that can get severe.  She always has to move them.  She gets up and walks during the middle of the night.  Her medications seem to be inconsistent.    06/21/13 update:  The patient presents today for restless leg followup.  She did have an EMG since last visit that did not demonstrate any evidence of a large fiber peripheral neuropathy.  She is on pramipexole 0.25 mg, 2 tablets after dinner and 2 tablets at bedtime.  She called me a few days ago because the symptoms were increasing, and I gave her samples of Lyrica.  She tried Lyrica 75 mg twice a day and got just a little bit groggy, so decided to just take it at night, and thus far it is working well.  She does remain on the Vicodin, one tablet at night.  12/19/13 update:  The patient is seen today in followup.  She has a history of restless leg syndrome.  She is on a number of medications for this.  She is on Mirapex 0.25 mg and takes 2 after dinner and 2 at night.  She is also on Lyrica 75 mg 1 after dinner and 2 tablets at night in addition to hydrocodone  at bedtime.  She knows if she does not take the Lyrica after dinner, then she has significant symptoms.  She has never tried not taking the hydrocodone.  She does state that she is having some difficulty sleeping at night, but it is not necessarily related to the restless leg.  She did have a fall and fractured her patella and has been having some difficulty getting back since then.  She had to cancel a cruise because of it.  She is just now able to walk.  She had swelling in the legs.  She states that she has always had some degree of swelling, but not like currently.  She asks me if I would sign a form so that she can do the bicycling program at the [facility].  This is a program that has traditionally been for Parkinson's patients but she talked to the organizer and she was told that she could join if she had a medical release.  09/28/14 update:  The patient returns today for follow-up.  I have not seen her since August of last year.  She is on pramipexole 0.25 mg, 2 tablets after dinner and 2 at bedtime.  She is on Lyrica  75 mg, one tablet after dinner and 2 tablets at bedtime.  She generally takes one tablet of hydrocodone at night.  States that her RLS is under great control and is pleased.  Is having significant issues with her allergies.  Took albuterol right before coming in and now having palpitations.  Has appt with the allergist right after this appt.  She is SOB but does not feel faint.    07/04/15 update:  The patient is following up today.  I have not seen her since the end of May.  However, I did review prior records made available to me.  She remains on Mirapex 0.25 mg, 2 tablets after dinner and 2 tablets at bedtime.  She is also on Lyrica 75 mg after dinner and 150 mg at bedtime.  If she misses dosages after dinner, she will definitely note that.  I did note that Dr. Elsworth Soho has added clonazepam at bedtime, but that was no necessarily for restless leg.  It was because she was on Ambien for insomnia and  they thought that clonazepam was a better choice. She states that she has that at home but she doesn't usually take that or the [place].   She remains on one tablet of hydrocodone at bedtime for restless leg, but her new pulmonary sleep doctor has been somewhat reluctant to continue to prescribe this medication.  The patient did undergo a left total knee arthroplasty on 04/09/2015.  She did well with the surgery.  PREVIOUS MEDICATIONS: n/a  ALLERGIES:  No Known Allergies  CURRENT MEDICATIONS:  Current Outpatient Prescriptions on File Prior to Visit  Medication Sig Dispense Refill  . furosemide (LASIX) 20 MG tablet Take 1 tablet (20 mg total) by mouth daily. 30 tablet 3  . HYDROcodone-acetaminophen (NORCO) 5-325 MG tablet Take 1 tablet by mouth at bedtime. 30 tablet 0  . mirabegron ER (MYRBETRIQ) 25 MG TB24 tablet Take 2 tablets (50 mg total) by mouth daily. (Patient taking differently: Take 50 mg by mouth at bedtime. ) 60 tablet 5  . Multiple Vitamin (MULTIVITAMIN WITH MINERALS) TABS tablet Take 1 tablet by mouth daily.    . pramipexole (MIRAPEX) 0.25 MG tablet Take 2 tablet in the afternoon and 2 tablets after dinner. 120 tablet 0  . pregabalin (LYRICA) 75 MG capsule 75 mg with dinner and 150 mg at bedtime 270 capsule 1  . simvastatin (ZOCOR) 20 MG tablet TAKE 1 TABLET (20 MG TOTAL) BY MOUTH AT BEDTIME. 90 tablet 1  . tiZANidine (ZANAFLEX) 4 MG tablet Take 1 tablet (4 mg total) by mouth every 6 (six) hours as needed for muscle spasms. (Patient not taking: Reported on 07/04/2015) 40 tablet 0  . [DISCONTINUED] VESICARE 10 MG tablet TAKE 1 TABLET EVERY DAY 30 tablet 0   No current facility-administered medications on file prior to visit.    PAST MEDICAL HISTORY:   Past Medical History  Diagnosis Date  . Venous insufficiency     legs  . Leg edema     bilateral  . RLS (restless legs syndrome)   . Esophageal reflux   . Urinary incontinence   . Skin cancer     hx of squamous cell buttocks  area, Yevette Edwards MD,Gen Surgery: Amy Martinique MD, derm  . Asthma   . Snoring     no sleep study  . Heart murmur   . Hypertension   . Arthritis     PAST SURGICAL HISTORY:   Past Surgical History  Procedure Laterality Date  .  Appendectomy    . Tonsillectomy    . Skin cancer removal      squamous cell interglureal space  . Ulnar nerve surgery      04/20/13:  pt denies  . Colonoscopy      neg; Dr Collene Mares  . Cystoscopy  2004    ? negative, Dr Jeffie Pollock  . Total knee arthroplasty Left 04/09/2015    Procedure: LEFT TOTAL KNEE ARTHROPLASTY;  Surgeon: Paralee Cancel, MD;  Location: [facility];  Service: Orthopedics;  Laterality: Left;    SOCIAL HISTORY:   Social History   Social History  . Marital Status: Married    Spouse Name: N/A  . Number of Children: N/A  . Years of Education: N/A   Occupational History  . retired Pharmacist, hospital    Social History Main Topics  . Smoking status: Former Smoker -- 0.50 packs/day for 15 years    Types: Cigarettes  . Smokeless tobacco: Never Used     Comment: smoked age [age], up to 1 ppd  . Alcohol Use: 1.8 oz/week    3 Glasses of wine per week     Comment: socially  . Drug Use: No  . Sexual Activity: Not on file   Other Topics Concern  . Not on file   Social History Narrative   2 children ages 9,35   Regular exercise no    FAMILY HISTORY:   Family Status  Relation Status Death Age  . Father Deceased 23    rls, esophageal CA  . Mother Alive     healthy  . Brother Alive     healthy  . Sister Alive     healthy  . Child Alive     2, alive and well    ROS:  A complete 10 system review of systems was obtained and was unremarkable apart from what is mentioned above.  PHYSICAL EXAMINATION:    VITALS:   Filed Vitals:   07/04/15 1333  BP: 122/74  Pulse: 98  Height: 5' 6.5" (1.689 m)  Weight: 199 lb (90.266 kg)   Wt Readings from Last 3 Encounters:  07/04/15 199 lb (90.266 kg)  05/23/15 199 lb (90.266 kg)  04/09/15 203 lb (92.08 kg)       GEN:  Normal appears female in no acute distress.  Appears younger than stated age 38:  Normocephalic, atraumatic. The mucous membranes are moist. The superficial temporal arteries are without ropiness or tenderness. Cardiovascular: Tachycardic.  Regular rhythm.  3/6 SEM.  No conversational dyspnea Lungs: Clear to auscultation bilaterally. Neck/Heme: There are no carotid bruits noted bilaterally.   NEUROLOGICAL: Orientation:  The patient is alert and oriented x 3.  Fund of knowledge is appropriate.  Recent and remote memory intact.  Attention span and concentration normal.  Repeats and names without difficulty. Cranial nerves: There is good facial symmetry.  There is mild L ptosis vs pseudoptosis.  The pupils are equal round and reactive to light bilaterally. Fundoscopic exam reveals clear disc margins bilaterally. Extraocular muscles are intact and visual fields are full to confrontational testing. Speech is fluent and clear. Soft palate rises symmetrically and there is no tongue deviation. Hearing is intact to conversational tone. Tone: Tone is good throughout. Sensation: Sensation is intact to light touch throughout Coordination:  The patient has no difficulty with RAM's or FNF bilaterally. Motor: Strength is 5/5 in the bilateral upper and lower extremities.  Shoulder shrug is equal and symmetric. There is no pronator drift.  There are no fasciculations  noted.   Lab Results  Component Value Date   [id number] 04/20/2013   Lab Results  Component Value Date   HGBA1C 5.2 01/08/2015     Chemistry      Component Value Date/Time   NA 142 06/21/2015 1543   K 3.6 06/21/2015 1543   CL 105 06/21/2015 1543   CO2 28 06/21/2015 1543   BUN 15 06/21/2015 1543   CREATININE 0.62 06/21/2015 1543      Component Value Date/Time   CALCIUM 9.4 06/21/2015 1543   ALKPHOS 75 06/21/2015 1543   AST 21 06/21/2015 1543   ALT 25 06/21/2015 1543   BILITOT 0.3 06/21/2015 1543     Lab  Results  Component Value Date   WBC 13.3* 04/10/2015   HGB 11.4* 04/10/2015   HCT 33.2* 04/10/2015   MCV 86.2 04/10/2015   PLT 223 04/10/2015   Lab Results  Component Value Date   TSH 3.23 10/18/2014   Lab Results  Component Value Date   IRON 46 03/19/2015   IRON 49 03/19/2015   TIBC 333 03/19/2015   FERRITIN 47.1 03/19/2015      IMPRESSION/PLAN  1. RLS  -The pt has a long hx and a strong fam hx of RLS  -She will remain on the mirapex 0.25 mg - 2 after dinner and 2 at bedtime.  We did talk about the phenomenon of augmentation, specifically associated with these dopamine agonists, but decided to stay on the Mirapex for now.  She really has been stable for a long time and I am encouraged by this.  -She seems to be doing well with the Lyrica 75 mg, one after dinner and 2 tablets per night and can continue on that.  A prescription was written for that.  -In the future, if we continue to struggle, then we will consider adding Klonopin back again (has it at home but not using), trying Topamax or even levodopa.  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   2. followup in 6 months.

## 2015-07-04 NOTE — Telephone Encounter (Signed)
error 

## 2015-07-08 ENCOUNTER — Telehealth: Payer: Self-pay | Admitting: Pulmonary Disease

## 2015-07-08 ENCOUNTER — Telehealth: Payer: Self-pay | Admitting: Neurology

## 2015-07-08 NOTE — Telephone Encounter (Signed)
lmtcb for pt.  

## 2015-07-08 NOTE — Telephone Encounter (Signed)
Left message on machine for patient to call back.

## 2015-07-09 MED ORDER — HYDROCODONE-ACETAMINOPHEN 5-325 MG PO TABS: 1.0000 | ORAL_TABLET | Freq: Every day | ORAL | Status: DC

## 2015-07-09 NOTE — Telephone Encounter (Signed)
Pat returned you call.  You were in a room with a patient.  Please Call her back.

## 2015-07-09 NOTE — Telephone Encounter (Signed)
Pt retuning call.Erin Roach ° °

## 2015-07-09 NOTE — Telephone Encounter (Signed)
PL provide a history of last few refills - that I can discuss on FU OV

## 2015-07-09 NOTE — Telephone Encounter (Signed)
Last refill of hydrocodone 5/325mg  given 06/10/2015 #30 with 0 refills, to be taken 1 tab po qhs for rls- this was approved by RA but signed by MW as RA was not in clinic to sign rx. Refill before that given 05/08/2015- same sig and strength. This was signed by MW as RA was unavailable to address message at this time.  This rx had been originally started by [facility] who was treating pt for rls.    Thanks!

## 2015-07-09 NOTE — Telephone Encounter (Signed)
Spoke with patient about medication. She will call back with any questions.

## 2015-07-09 NOTE — Telephone Encounter (Signed)
OK to refill x 1 Will discuss on FU OV

## 2015-07-09 NOTE — Telephone Encounter (Signed)
PT is requesting refill for Hydrocodone for RLS. OV scheduled with Dr Elsworth Soho on 07/30/15.  Please advised if ok to refill

## 2015-07-09 NOTE — Telephone Encounter (Signed)
lmtcb X2 for pt.  

## 2015-07-09 NOTE — Telephone Encounter (Signed)
[phone], pt cb

## 2015-07-09 NOTE — Telephone Encounter (Signed)
Left message for patient to call back  

## 2015-07-09 NOTE — Telephone Encounter (Signed)
Hydrocodone 5 325 rx signed by Dr. Elsworth Soho and placed at front for pick up. lmomtcb for pt to inform her of above.

## 2015-07-10 NOTE — Telephone Encounter (Signed)
LMOM letting her know that her rx is ready to be picked up and to keep appt on 07/30/15

## 2015-07-14 ENCOUNTER — Other Ambulatory Visit: Payer: Self-pay | Admitting: Internal Medicine

## 2015-07-17 ENCOUNTER — Ambulatory Visit (INDEPENDENT_AMBULATORY_CARE_PROVIDER_SITE_OTHER): Payer: Medicare Other | Admitting: Internal Medicine

## 2015-07-17 ENCOUNTER — Encounter: Payer: Self-pay | Admitting: Internal Medicine

## 2015-07-17 VITALS — BP 148/90 | HR 98 | Temp 99.1°F | Resp 16 | Wt 198.0 lb

## 2015-07-17 DIAGNOSIS — J302 Other seasonal allergic rhinitis: Secondary | ICD-10-CM

## 2015-07-17 DIAGNOSIS — G4700 Insomnia, unspecified: Secondary | ICD-10-CM | POA: Diagnosis not present

## 2015-07-17 DIAGNOSIS — G2581 Restless legs syndrome: Secondary | ICD-10-CM

## 2015-07-17 DIAGNOSIS — R600 Localized edema: Secondary | ICD-10-CM | POA: Diagnosis not present

## 2015-07-17 DIAGNOSIS — J309 Allergic rhinitis, unspecified: Secondary | ICD-10-CM | POA: Insufficient documentation

## 2015-07-17 MED ORDER — TRAZODONE HCL 50 MG PO TABS: 50.0000 mg | ORAL_TABLET | Freq: Every evening | ORAL | Status: DC | PRN

## 2015-07-17 MED ORDER — HYDROCODONE-ACETAMINOPHEN 5-325 MG PO TABS: 1.0000 | ORAL_TABLET | Freq: Every day | ORAL | Status: DC

## 2015-07-17 MED ORDER — LEVOCETIRIZINE DIHYDROCHLORIDE 5 MG PO TABS: 5.0000 mg | ORAL_TABLET | Freq: Every evening | ORAL | Status: DC

## 2015-07-17 NOTE — Assessment & Plan Note (Signed)
Follow with neuro - she prescribes the mirapex I will prescribe the hydrocodone - she takes it at night only She has tried several other medications and this regimen is effective Refill given today

## 2015-07-17 NOTE — Assessment & Plan Note (Signed)
Slight improvement with lasix Continue lasix

## 2015-07-17 NOTE — Assessment & Plan Note (Signed)
Chronic Has been on ambien in the past and it is effective Trial of trazodone  Which is safer longterm - if not effective can try hydrozyzine or belsorma Can use ambien if needed - discussed concerns with long term use - she has tolerated well in the past

## 2015-07-17 NOTE — Patient Instructions (Addendum)
A prescription for the hydrocodone was given - have it filled in the next month.  We will try trazodone for your sleep - try one pill for two nights and if this does not work take two pills.  We will try xyzal for your allergies.    Call with any questions or concerns.   Update on how you are doing in a week or two so we can make changes if needed.   Follow up in 6 months.

## 2015-07-17 NOTE — Assessment & Plan Note (Signed)
Has done allergy injections in past Has tried several medications Continue eye allergy drops Trial of xyzal Can rotate with claritin, allegra and zyrtec

## 2015-07-17 NOTE — Progress Notes (Signed)
Subjective:    Patient ID: Erin Roach, female    DOB: 10/12/1940, 75 y.o.   MRN: [id number]  HPI  She is here for follow up.   Leg edema:  She changed her from spironolactone to lasix in mid January. The lasix is a little better, but she still has swelling.    RLS:  It is severe.  She is following with neurology and she prescribes the mirapex.   She was getting hydrocodone by another doctor, but they can no longer prescribe it.  She was hoping I would prescribe it.  She takes one tablet at bedtime. This regimen has been effective.  She has tried several other things in the past.    Allergies:  She has not had any allergy injections since her knee replacement.  She does not think she needs them.  She has been taking an oral anti-histamine chlorpherniramine.  It is not helping.  Her eyes are very itchy and running.  She does use an allergy eye drop.  She has tried zyrtec, claritin and allegra in the past.    Insomnia:  She has had this for a long time.  When she had the knee replacement it was not an issue.  She is still recovering from the knee surgery.  Years ago she used [place] and it worked.  She has tried zzzz and she sleeps for three hours.  She has tried nyquil.  She wonders what she can use - maybe the [place] again.       Medications and allergies reviewed with patient and updated if appropriate.  Patient Active Problem List   Diagnosis Date Noted  . Obese 04/10/2015  . S/P left TKA 04/09/2015  . Hyperglycemia 01/09/2015  . Restless leg 12/19/2013  . Osteopenia 12/04/2012  . GERD (gastroesophageal reflux disease) 02/07/2012  . Piriformis syndrome 01/07/2011  . FOOT PAIN, BILATERAL 07/17/2010  . ELEVATED BLOOD PRESSURE WITHOUT DIAGNOSIS OF HYPERTENSION 01/15/2010  . 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 RESTLESS LEG SYNDROME 02/11/2009  . LEG EDEMA, BILATERAL 02/11/2009  . Hyperlipidemia 10/30/2008  . URINARY INCONTINENCE 10/30/2008  . SKIN CANCER, HX OF 10/30/2008  . Asthma  11/21/2007  . VENOUS INSUFFICIENCY, LEGS 11/22/2006  . SNORING 11/22/2006    Current Outpatient Prescriptions on File Prior to Visit  Medication Sig Dispense Refill  . furosemide (LASIX) 20 MG tablet Take 1 tablet (20 mg total) by mouth daily. 30 tablet 3  . HYDROcodone-acetaminophen (NORCO) 5-325 MG tablet Take 1 tablet by mouth at bedtime. 30 tablet 0  . Multiple Vitamin (MULTIVITAMIN WITH MINERALS) TABS tablet Take 1 tablet by mouth daily.    [name] MYRBETRIQ 25 MG TB24 tablet TAKE 2 TABLETS (50 MG TOTAL) BY MOUTH DAILY. 60 tablet 5  . pramipexole (MIRAPEX) 0.25 MG tablet Take 2 tablet in the afternoon and 2 tablets after dinner. 120 tablet 0  . pregabalin (LYRICA) 75 MG capsule 75 mg with dinner and 150 mg at bedtime 270 capsule 1  . simvastatin (ZOCOR) 20 MG tablet TAKE 1 TABLET (20 MG TOTAL) BY MOUTH AT BEDTIME. 90 tablet 1  . tiZANidine (ZANAFLEX) 4 MG tablet Take 1 tablet (4 mg total) by mouth every 6 (six) hours as needed for muscle spasms. (Patient not taking: Reported on 07/17/2015) 40 tablet 0  . [DISCONTINUED] VESICARE 10 MG tablet TAKE 1 TABLET EVERY DAY 30 tablet 0   No current facility-administered medications on file prior to visit.    Past Medical History  Diagnosis Date  .  Venous insufficiency     legs  . Leg edema     bilateral  . RLS (restless legs syndrome)   . Esophageal reflux   . Urinary incontinence   . Skin cancer     hx of squamous cell buttocks area, Yevette Edwards MD,Gen Surgery: Amy Martinique MD, derm  . Asthma   . Snoring     no sleep study  . Heart murmur   . Hypertension   . Arthritis     Past Surgical History  Procedure Laterality Date  . Appendectomy    . Tonsillectomy    . Skin cancer removal      squamous cell interglureal space  . Ulnar nerve surgery      04/20/13:  pt denies  . Colonoscopy      neg; Dr Collene Mares  . Cystoscopy  2004    ? negative, Dr Jeffie Pollock  . Total knee arthroplasty Left 04/09/2015    Procedure: LEFT TOTAL KNEE ARTHROPLASTY;   Surgeon: Paralee Cancel, MD;  Location: [facility];  Service: Orthopedics;  Laterality: Left;    Social History   Social History  . Marital Status: Married    Spouse Name: N/A  . Number of Children: N/A  . Years of Education: N/A   Occupational History  . retired Pharmacist, hospital    Social History Main Topics  . Smoking status: Former Smoker -- 0.50 packs/day for 15 years    Types: Cigarettes  . Smokeless tobacco: Never Used     Comment: smoked age [age], up to 1 ppd  . Alcohol Use: 1.8 oz/week    3 Glasses of wine per week     Comment: socially  . Drug Use: No  . Sexual Activity: Not on file   Other Topics Concern  . Not on file   Social History Narrative   2 children ages 61,35   Regular exercise no    Family History  Problem Relation Age of Onset  . Esophageal cancer Father   . Hyperlipidemia Mother   . Stroke Maternal Grandmother 66  . Diabetes Maternal Grandmother   . Heart attack Maternal Grandfather 93  . COPD Maternal Grandfather   . Heart attack Paternal Grandmother     39s  . Diabetes      MGGM    Review of Systems  Constitutional: Positive for fatigue. Negative for fever.  HENT: Positive for congestion, postnasal drip and rhinorrhea.   Eyes: Positive for redness and itching.  Respiratory: Negative for shortness of breath.   Cardiovascular: Positive for leg swelling. Negative for chest pain and palpitations.  Neurological: Positive for headaches.  Psychiatric/Behavioral: Positive for sleep disturbance.       Objective:   Filed Vitals:   07/17/15 1616  BP: 148/90  Pulse: 98  Temp: 99.1 F (37.3 C)  Resp: 16   Filed Weights   07/17/15 1616  Weight: 198 lb (89.812 kg)   Body mass index is 31.48 kg/(m^2).   Physical Exam Constitutional: Appears well-developed and well-nourished. No distress.  Neck: Neck supple. No tracheal deviation present. No thyromegaly present.  No carotid bruit. No cervical adenopathy.   Cardiovascular: Normal rate, regular  rhythm and normal heart sounds.   2/6 sys murmur.  trace edema Pulmonary/Chest: Effort normal and breath sounds normal. No respiratory distress. No wheezes.        Assessment & Plan:   See Problem List for Assessment and Plan of chronic medical problems.  Follow up in 6 months

## 2015-07-17 NOTE — Progress Notes (Signed)
Pre visit review using our clinic review tool, if applicable. No additional management support is needed unless otherwise documented below in the visit note. 

## 2015-07-30 ENCOUNTER — Ambulatory Visit (INDEPENDENT_AMBULATORY_CARE_PROVIDER_SITE_OTHER): Payer: Medicare Other | Admitting: Pulmonary Disease

## 2015-07-30 ENCOUNTER — Encounter: Payer: Self-pay | Admitting: Pulmonary Disease

## 2015-07-30 ENCOUNTER — Telehealth: Payer: Self-pay | Admitting: Pulmonary Disease

## 2015-07-30 VITALS — BP 150/84 | HR 98 | Ht 66.5 in | Wt 200.6 lb

## 2015-07-30 DIAGNOSIS — G4700 Insomnia, unspecified: Secondary | ICD-10-CM

## 2015-07-30 DIAGNOSIS — G2581 Restless legs syndrome: Secondary | ICD-10-CM | POA: Diagnosis not present

## 2015-07-30 MED ORDER — GABAPENTIN 300 MG PO CAPS: 300.0000 mg | ORAL_CAPSULE | Freq: Every day | ORAL | Status: DC

## 2015-07-30 NOTE — Assessment & Plan Note (Signed)
We discussed her current regimen-we discussed addiction potential with long-term hydrocodone She really does have quite severe restless legs and I am now concerned about some degree of augmentation with early onset of symptoms in the afternoon. If this is indeed the case then we may have to decrease the dose of Mirapex in the future and consider alternatives. She is very hesitant to try the neupro patch  Trial of neurontin 1 tab instead of hydrocodone at bedtime #30 Call back in 1 week to report We discussed alternatives such as acupuncture & RELAXIS

## 2015-07-30 NOTE — Telephone Encounter (Signed)
I do not know if her DME company can provide this Please check with advance If not she may have to order it on line-can provide prescription if needed

## 2015-07-30 NOTE — Progress Notes (Signed)
   Subjective:    Patient ID: Erin Roach, female    DOB: Oct 17, 1940, 75 y.o.   MRN: [id number]  HPI  Former [place] pt, follow-up of severe restless leg syndrome. Onset around age 37s after pregnancy She has been seen by neurology. She takes Mirapex, Lyrica, and hydrocodone at bedtime in order to control her symptoms  She has been on Requip, clonazepam, Neupro (site reaction) and Horizant in the past  EMG neg   07/30/2015  Chief Complaint  Patient presents with  . Follow-up    patient doing well.  discuss issues early in the day with restless legs.  Usually it is at night, but now having problems during the day.     Started on trazodone For insomnia by PCP -100 mg works , has tried Ambien in the past  Lt TKR - 2 m ago and was given additional hydrocodone  Feels that onset of restless legs is now in the afternoon She was only taking 1 tablet of Mirapex in the afternoon with Lyrica and now has increased to 2 tablets in the afternoon and bedtime  Review of Systems Patient denies significant dyspnea,cough, hemoptysis,  chest pain, palpitations, pedal edema, orthopnea, paroxysmal nocturnal dyspnea, lightheadedness, nausea, vomiting, abdominal or  leg pains       Objective:   Physical Exam  Gen. Pleasant, well-nourished, in no distress ENT - no lesions, no post nasal drip Neck: No JVD, no thyromegaly, no carotid bruits Lungs: no use of accessory muscles, no dullness to percussion, clear without rales or rhonchi  Cardiovascular: Rhythm regular, heart sounds  normal, no murmurs or gallops, no peripheral edema Musculoskeletal: No deformities, no cyanosis or clubbing        Assessment & Plan:

## 2015-07-30 NOTE — Telephone Encounter (Signed)
Spoke with pt, states she would like to have a rx for this-would like to pick up rx in office.  RA please advise how to prescribe this for pt.  Thanks!

## 2015-07-30 NOTE — Assessment & Plan Note (Signed)
Trial of neurontin 1 tab instead of hydrocodone at bedtime #30 Call back in 1 week to report We discussed alternatives such as acupuncture & RELAXIS

## 2015-07-30 NOTE — Assessment & Plan Note (Signed)
Trazodone 100 mg ok

## 2015-07-30 NOTE — Patient Instructions (Signed)
Trial of neurontin 1 tab instead of hydrocodone at bedtime #30 Call back in 1 week to report We discussed alternatives such as acupuncture & RELAXIS

## 2015-07-30 NOTE — Telephone Encounter (Signed)
Called and spoke to pt. Pt is requesting to try Relaxis that was suggested at 07/30/15 OV by RA.   Dr. Elsworth Soho please advise if ok to order and does this need to go through a DME company. Thanks.

## 2015-07-31 NOTE — Telephone Encounter (Signed)
Per Dr. Elsworth Soho,  Hand written Rx for Relaxis left at front for patient to pick up.  Called and left detailed message on patient's voicemail advising her to come pick up rx. Nothing further needed.

## 2015-08-12 ENCOUNTER — Other Ambulatory Visit: Payer: Self-pay | Admitting: Internal Medicine

## 2015-08-15 ENCOUNTER — Telehealth: Payer: Self-pay | Admitting: Internal Medicine

## 2015-08-15 MED ORDER — HYDROCODONE-ACETAMINOPHEN 5-325 MG PO TABS: 1.0000 | ORAL_TABLET | Freq: Every day | ORAL | Status: DC

## 2015-08-15 NOTE — Telephone Encounter (Signed)
Please advise, Script was written on 07/17/15

## 2015-08-15 NOTE — Telephone Encounter (Signed)
Pt called in and said that she has lost her Hydrocodone script in the move and can not find it., can this be rewritten?   She also wants Dr burns to know that the sleep meds are helping to her

## 2015-08-15 NOTE — Telephone Encounter (Signed)
rx printed - it has been one month so she is due.

## 2015-08-16 NOTE — Telephone Encounter (Signed)
Spoke with pt to inform RX is ready for pick up  

## 2015-08-26 ENCOUNTER — Other Ambulatory Visit: Payer: Self-pay | Admitting: Pulmonary Disease

## 2015-08-28 ENCOUNTER — Ambulatory Visit: Payer: Medicare Other | Admitting: Adult Health

## 2015-08-30 ENCOUNTER — Other Ambulatory Visit: Payer: Self-pay | Admitting: Neurology

## 2015-08-30 MED ORDER — PRAMIPEXOLE DIHYDROCHLORIDE 0.25 MG PO TABS: ORAL_TABLET | ORAL | Status: DC

## 2015-08-30 NOTE — Telephone Encounter (Signed)
Mirapex refill requested. Per last office note- patient to remain on medication. Refill approved and sent to patient's pharmacy.   

## 2015-09-11 ENCOUNTER — Encounter: Payer: Self-pay | Admitting: Internal Medicine

## 2015-09-11 ENCOUNTER — Ambulatory Visit (INDEPENDENT_AMBULATORY_CARE_PROVIDER_SITE_OTHER): Payer: Medicare Other | Admitting: Internal Medicine

## 2015-09-11 VITALS — BP 148/88 | HR 95 | Temp 98.6°F | Resp 20 | Wt 199.0 lb

## 2015-09-11 DIAGNOSIS — R600 Localized edema: Secondary | ICD-10-CM | POA: Diagnosis not present

## 2015-09-11 DIAGNOSIS — R739 Hyperglycemia, unspecified: Secondary | ICD-10-CM

## 2015-09-11 DIAGNOSIS — I10 Essential (primary) hypertension: Secondary | ICD-10-CM | POA: Diagnosis not present

## 2015-09-11 DIAGNOSIS — L259 Unspecified contact dermatitis, unspecified cause: Secondary | ICD-10-CM | POA: Diagnosis not present

## 2015-09-11 MED ORDER — LOSARTAN POTASSIUM 50 MG PO TABS: 50.0000 mg | ORAL_TABLET | Freq: Every day | ORAL | Status: DC

## 2015-09-11 NOTE — Patient Instructions (Signed)
Please take all new medication as prescribed - the losartan 50 mg per day  Please continue all other medications as before, and refills have been done if requested.  Please have the pharmacy call with any other refills you may need.  Please continue your efforts at being more active, low cholesterol diet, and weight control.  Please keep your appointments with your specialists as you may have planned  Please see Dr Quay Burow when you return from your trip for re-check and probable blood testing for kidney function

## 2015-09-11 NOTE — Progress Notes (Signed)
Pre visit review using our clinic review tool, if applicable. No additional management support is needed unless otherwise documented below in the visit note. 

## 2015-09-12 ENCOUNTER — Encounter: Payer: Self-pay | Admitting: Internal Medicine

## 2015-09-12 ENCOUNTER — Telehealth: Payer: Self-pay

## 2015-09-12 DIAGNOSIS — I152 Hypertension secondary to endocrine disorders: Secondary | ICD-10-CM | POA: Insufficient documentation

## 2015-09-12 DIAGNOSIS — I10 Essential (primary) hypertension: Secondary | ICD-10-CM

## 2015-09-12 DIAGNOSIS — L259 Unspecified contact dermatitis, unspecified cause: Secondary | ICD-10-CM | POA: Insufficient documentation

## 2015-09-12 HISTORY — DX: Essential (primary) hypertension: I10

## 2015-09-12 NOTE — Telephone Encounter (Signed)
Dr. Jenny Reichmann put patient on BP medications. She wants to know if she can take two of them a day she states he told her they want to move up the dose. She has had a bad a headache the last two days. PLease follow up

## 2015-09-12 NOTE — Assessment & Plan Note (Signed)
Mild to mod, for depomedrol IM, triam cr prn,,  to f/u any worsening symptoms or concerns

## 2015-09-12 NOTE — Progress Notes (Signed)
Subjective:    Patient ID: Erin Roach, female    DOB: 06-15-1940, 75 y.o.   MRN: [id number]  HPI  Here to f/u; overall doing ok,  Pt denies chest pain, increasing sob or doe, wheezing, orthopnea, PND, increased LE swelling, palpitations, dizziness or syncope.  Pt denies new neurological symptoms such as new headache, or facial or extremity weakness or numbness.  Pt denies polydipsia, polyuria, or low sugar episode.   Pt denies new neurological symptoms such as new headache, or facial or extremity weakness or numbness.   Pt states overall good compliance with meds, mostly trying to follow appropriate diet, with wt overall stable,  but little exercise however.  BP has been elevated recently similar to today over the past 2 mo.  No prior hx of this.  Have overall gained several lbs in the past recent years. Also with recent onset rash to arms after working in the yard, with multiple itchy red areas that seem to be spreading.  Incidentally seen per optho earlier today with dx of viral conjunctivitis per pt.  Past Medical History  Diagnosis Date  . Venous insufficiency     legs  . Leg edema     bilateral  . RLS (restless legs syndrome)   . Esophageal reflux   . Urinary incontinence   . Skin cancer     hx of squamous cell buttocks area, Yevette Edwards MD,Gen Surgery: Amy Martinique MD, derm  . Asthma   . Snoring     no sleep study  . Heart murmur   . Hypertension   . Arthritis    Past Surgical History  Procedure Laterality Date  . Appendectomy    . Tonsillectomy    . Skin cancer removal      squamous cell interglureal space  . Ulnar nerve surgery      04/20/13:  pt denies  . Colonoscopy      neg; Dr Collene Mares  . Cystoscopy  2004    ? negative, Dr Jeffie Pollock  . Total knee arthroplasty Left 04/09/2015    Procedure: LEFT TOTAL KNEE ARTHROPLASTY;  Surgeon: Paralee Cancel, MD;  Location: [facility];  Service: Orthopedics;  Laterality: Left;    reports that she has quit smoking. Her smoking use included  Cigarettes. She has a 7.5 pack-year smoking history. She has never used smokeless tobacco. She reports that she drinks about 1.8 oz of alcohol per week. She reports that she does not use illicit drugs. family history includes COPD in her maternal grandfather; Diabetes in her maternal grandmother; Esophageal cancer in her father; Heart attack in her paternal grandmother; Heart attack (age of onset: 68) in her maternal grandfather; Hyperlipidemia in her mother; Stroke (age of onset: 48) in her maternal grandmother. No Known Allergies Current Outpatient Prescriptions on File Prior to Visit  Medication Sig Dispense Refill  . furosemide (LASIX) 20 MG tablet TAKE 1 TABLET BY MOUTH DAILY 30 tablet 2  . levocetirizine (XYZAL) 5 MG tablet Take 1 tablet (5 mg total) by mouth every evening. 30 tablet 3  . Multiple Vitamin (MULTIVITAMIN WITH MINERALS) TABS tablet Take 1 tablet by mouth daily.    [name] MYRBETRIQ 25 MG TB24 tablet TAKE 2 TABLETS (50 MG TOTAL) BY MOUTH DAILY. 60 tablet 5  . pramipexole (MIRAPEX) 0.25 MG tablet Take 2 tablet in the afternoon and 2 tablets after dinner. 120 tablet 5  . pregabalin (LYRICA) 75 MG capsule 75 mg with dinner and 150 mg at bedtime 270 capsule 1  .  simvastatin (ZOCOR) 20 MG tablet TAKE 1 TABLET (20 MG TOTAL) BY MOUTH AT BEDTIME. 90 tablet 1  . traZODone (DESYREL) 50 MG tablet Take 1-2 tablets (50-100 mg total) by mouth at bedtime as needed for sleep. 60 tablet 5  . [DISCONTINUED] VESICARE 10 MG tablet TAKE 1 TABLET EVERY DAY 30 tablet 0   No current facility-administered medications on file prior to visit.   Review of Systems  Constitutional: Negative for unusual diaphoresis or night sweats HENT: Negative for ear swelling or discharge Eyes: Negative for worsening visual haziness  Respiratory: Negative for choking and stridor.   Gastrointestinal: Negative for distension or worsening eructation Genitourinary: Negative for retention or change in urine volume.    Musculoskeletal: Negative for other MSK pain or swelling Skin: Negative for color change and worsening wound Neurological: Negative for tremors and numbness other than noted  Psychiatric/Behavioral: Negative for decreased concentration or agitation other than above       Objective:   Physical Exam BP 148/88 mmHg  Pulse 95  Temp(Src) 98.6 F (37 C) (Oral)  Resp 20  Wt 199 lb (90.266 kg)  SpO2 99%  VS noted,  Constitutional: Pt appears in no apparent distress HENT: Head: NCAT.  Right Ear: External ear normal.  Left Ear: External ear normal.  Eyes: . Pupils are equal, round, and reactive to light. Conjunctivae and EOM are normal Neck: Normal range of motion. Neck supple.  Cardiovascular: Normal rate and regular rhythm.   Pulmonary/Chest: Effort normal and breath sounds without rales or wheezing.  Neurological: Pt is alert. Not confused , motor grossly intact Skin: Skin is warm. + rash with numerous small itchy erythema nontender nonraised non draining to distal arms, trace bilat LE edema Psychiatric: Pt behavior is normal. No agitation.     Assessment & Plan:

## 2015-09-12 NOTE — Assessment & Plan Note (Signed)
C/w venous insufficiency, to cont lasix as rx,  to f/u any worsening symptoms or concerns

## 2015-09-12 NOTE — Assessment & Plan Note (Signed)
stable overall by history and exam, recent data reviewed with pt, and pt to continue medical treatment as before,  to f/u any worsening symptoms or concerns Lab Results  Component Value Date   HGBA1C 5.2 01/08/2015

## 2015-09-12 NOTE — Assessment & Plan Note (Signed)
Recent onset now mild persistent, no hx of cv end organ damage or DM or COPD; does have mild hyperglycemia in the past with several normal a1c but is obese and at risk of worsening; will start losartan 50 qd;  Pt and husband will be out of town for 2 wks, will make f/u appt with PCP after this, with plan for BMP f/u

## 2015-09-13 NOTE — Telephone Encounter (Signed)
Has she been checking her BP?  Needs to come in for follow up - if she can not be scheduled with me quickly - at least needs a nurse visit soon for to check BP and f/u with me soon

## 2015-09-13 NOTE — Telephone Encounter (Signed)
Should she come in for an appt?

## 2015-09-13 NOTE — Telephone Encounter (Signed)
LVM for pt to call back.

## 2015-09-16 ENCOUNTER — Telehealth: Payer: Self-pay

## 2015-09-16 NOTE — Telephone Encounter (Signed)
Patient called and said she had a VM from [place]. She is leaving for vaction for 2 weeks. Said her headaches are better. Has not had one all weekend. She did make a app to talk about the bp and medication on May 30.

## 2015-10-03 ENCOUNTER — Other Ambulatory Visit: Payer: Self-pay | Admitting: Pulmonary Disease

## 2015-10-08 ENCOUNTER — Ambulatory Visit: Payer: Medicare Other | Admitting: Internal Medicine

## 2015-10-11 ENCOUNTER — Ambulatory Visit: Payer: Medicare Other | Admitting: Internal Medicine

## 2015-10-16 ENCOUNTER — Encounter: Payer: Self-pay | Admitting: Pulmonary Disease

## 2015-10-16 ENCOUNTER — Ambulatory Visit (INDEPENDENT_AMBULATORY_CARE_PROVIDER_SITE_OTHER): Payer: Medicare Other | Admitting: Pulmonary Disease

## 2015-10-16 VITALS — BP 164/92 | HR 86 | Ht 66.5 in | Wt 196.2 lb

## 2015-10-16 DIAGNOSIS — G2581 Restless legs syndrome: Secondary | ICD-10-CM

## 2015-10-16 DIAGNOSIS — I10 Essential (primary) hypertension: Secondary | ICD-10-CM | POA: Diagnosis not present

## 2015-10-16 NOTE — Assessment & Plan Note (Signed)
Blood pressure high today Follow up with PCP

## 2015-10-16 NOTE — Patient Instructions (Addendum)
Decrease Lyrica to 150 mg at bedtime-cut out evening dose In July, decrease evening dose of Mirapex to 1 tablet In August, cut out evening dose of Mirapex and stay on bedtime dose of 2 tablets until next visit

## 2015-10-16 NOTE — Assessment & Plan Note (Signed)
Decrease Lyrica to 150 mg at bedtime-cut out evening dose In July, decrease evening dose of Mirapex to 1 tablet In August, cut out evening dose of Mirapex and stay on bedtime dose of 2 tablets until next visit

## 2015-10-16 NOTE — Progress Notes (Signed)
   Subjective:    Patient ID: Erin Roach, female    DOB: 1940-08-20, 75 y.o.   MRN: [id number]  HPI  Former [facility] pt, follow-up of severe restless leg syndrome. Onset around age 20s after pregnancy She has been seen by neurology. She takes Mirapex, Lyrica, and hydrocodone at bedtime in order to control her symptoms  She has been on Requip, clonazepam, Neupro (site reaction) and Horizant in the past  EMG neg   10/16/2015  Chief Complaint  Patient presents with  . Follow-up    patient states that her restless legs has been great.     25m FU On her last visit we suggested- Trial of neurontin 1 tab instead of hydrocodone at bedtime  And trial of relaxis -mechanical therapy She was able to get this for 6 $50 and this has worked wonders for her She has come off Klonopin and not use hydrocodone She did not need to fill in the prescription for Neurontin  She continues to stay on Mirapex, Lyrica and trazodone at bedtime  Her symptoms of augmentation of decreased somewhat she does not complain of early evening or afternoon symptoms   She was able to travel with her device  Review of Systems Patient denies significant dyspnea,cough, hemoptysis,  chest pain, palpitations, pedal edema, orthopnea, paroxysmal nocturnal dyspnea, lightheadedness, nausea, vomiting, abdominal or  leg pains      Objective:   Physical Exam  Gen. Pleasant, well-nourished, in no distress ENT - no lesions, no post nasal drip Neck: No JVD, no thyromegaly, no carotid bruits Lungs: no use of accessory muscles, no dullness to percussion, clear without rales or rhonchi  Cardiovascular: Rhythm regular, heart sounds  normal, no murmurs or gallops, no peripheral edema Musculoskeletal: No deformities, no cyanosis or clubbing        Assessment & Plan:

## 2015-10-18 ENCOUNTER — Telehealth: Payer: Self-pay

## 2015-10-18 NOTE — Telephone Encounter (Signed)
Patient called and thinks she has a UTI and wants to come into lab to have it tested. Please follow up. Thank you.

## 2015-10-18 NOTE — Telephone Encounter (Signed)
Pt aware.

## 2015-10-18 NOTE — Telephone Encounter (Signed)
Please advise 

## 2015-10-18 NOTE — Telephone Encounter (Signed)
Ok but will not get results right away -- may need to consider urgent care for quicker results

## 2015-10-25 ENCOUNTER — Ambulatory Visit (INDEPENDENT_AMBULATORY_CARE_PROVIDER_SITE_OTHER): Payer: Medicare Other | Admitting: Internal Medicine

## 2015-10-25 ENCOUNTER — Encounter: Payer: Self-pay | Admitting: Internal Medicine

## 2015-10-25 VITALS — BP 164/90 | HR 94 | Temp 98.0°F | Resp 16 | Wt 199.0 lb

## 2015-10-25 DIAGNOSIS — I10 Essential (primary) hypertension: Secondary | ICD-10-CM | POA: Diagnosis not present

## 2015-10-25 MED ORDER — LOSARTAN POTASSIUM 100 MG PO TABS: 100.0000 mg | ORAL_TABLET | Freq: Every day | ORAL | Status: DC

## 2015-10-25 NOTE — Patient Instructions (Addendum)
   Medications reviewed and updated.  Changes include increasing losartan to 100 mg daily.  Your prescription(s) have been submitted to your pharmacy. Please take as directed and contact our office if you believe you are having problem(s) with the medication(s).   Please followup in 4 weeks

## 2015-10-25 NOTE — Progress Notes (Signed)
Pre visit review using our clinic review tool, if applicable. No additional management support is needed unless otherwise documented below in the visit note. 

## 2015-10-25 NOTE — Assessment & Plan Note (Signed)
BP not controlled Discussed options Increase losartan to 100 mg daily Monitor BP at home Will check cmp at next visit Work on weight loss Low sodium diet, exercise

## 2015-10-25 NOTE — Progress Notes (Signed)
Subjective:    Patient ID: Erin Roach, female    DOB: [birth year], 75 y.o.   MRN: [id number]  HPI She is here for follow up.  Hypertension: she was seen a couple of weeks ago for elevated blood pressure.  She finally agrees she likely needs medication.  She was started on losartan 50 mg daily and is taking it daily. She is compliant with a low sodium diet.  She denies chest pain, palpitations, edema, shortness of breath and regular headaches. She is not exercising regularly.  She does not monitor her blood pressure at home, but  - two days ago it was 110/70 by a nurse from her [place] but a couple of other times she had it checked it was elevated.  She denies side effects from the medication.    She knows she needs to lose weight.   Medications and allergies reviewed with patient and updated if appropriate.  Patient Active Problem List   Diagnosis Date Noted  . Essential hypertension 09/12/2015  . Contact dermatitis 09/12/2015  . Allergic rhinitis 07/17/2015  . Insomnia 07/17/2015  . Obese 04/10/2015  . S/P left TKA 04/09/2015  . Hyperglycemia 01/09/2015  . Restless leg 12/19/2013  . Osteopenia 12/04/2012  . GERD (gastroesophageal reflux disease) 02/07/2012  . Piriformis syndrome 01/07/2011  . FOOT PAIN, BILATERAL 07/17/2010  . 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 RESTLESS LEG SYNDROME 02/11/2009  . LEG EDEMA, BILATERAL 02/11/2009  . Hyperlipidemia 10/30/2008  . URINARY INCONTINENCE 10/30/2008  . SKIN CANCER, HX OF 10/30/2008  . Asthma 11/21/2007  . VENOUS INSUFFICIENCY, LEGS 11/22/2006  . SNORING 11/22/2006    Current Outpatient Prescriptions on File Prior to Visit  Medication Sig Dispense Refill  . furosemide (LASIX) 20 MG tablet TAKE 1 TABLET BY MOUTH DAILY 30 tablet 2  . levocetirizine (XYZAL) 5 MG tablet Take 1 tablet (5 mg total) by mouth every evening. 30 tablet 3  . losartan (COZAAR) 50 MG tablet Take 1 tablet (50 mg total) by mouth daily. 90 tablet 3  .  Multiple Vitamin (MULTIVITAMIN WITH MINERALS) TABS tablet Take 1 tablet by mouth daily.    [name] MYRBETRIQ 25 MG TB24 tablet TAKE 2 TABLETS (50 MG TOTAL) BY MOUTH DAILY. 60 tablet 5  . pramipexole (MIRAPEX) 0.25 MG tablet Take 2 tablet in the afternoon and 2 tablets after dinner. 120 tablet 5  . pregabalin (LYRICA) 75 MG capsule 75 mg with dinner and 150 mg at bedtime 270 capsule 1  . simvastatin (ZOCOR) 20 MG tablet TAKE 1 TABLET (20 MG TOTAL) BY MOUTH AT BEDTIME. 90 tablet 1  . traZODone (DESYREL) 50 MG tablet Take 1-2 tablets (50-100 mg total) by mouth at bedtime as needed for sleep. 60 tablet 5  . [DISCONTINUED] VESICARE 10 MG tablet TAKE 1 TABLET EVERY DAY 30 tablet 0   No current facility-administered medications on file prior to visit.    Past Medical History  Diagnosis Date  . Venous insufficiency     legs  . Leg edema     bilateral  . RLS (restless legs syndrome)   . Esophageal reflux   . Urinary incontinence   . Skin cancer     hx of squamous cell buttocks area, Yevette Edwards MD,Gen Surgery: Amy Martinique MD, derm  . Asthma   . Snoring     no sleep study  . Heart murmur   . Hypertension   . Arthritis   . Essential hypertension 09/12/2015    Past Surgical  History  Procedure Laterality Date  . Appendectomy    . Tonsillectomy    . Skin cancer removal      squamous cell interglureal space  . Ulnar nerve surgery      04/20/13:  pt denies  . Colonoscopy      neg; Dr Collene Mares  . Cystoscopy  2004    ? negative, Dr Jeffie Pollock  . Total knee arthroplasty Left 04/09/2015    Procedure: LEFT TOTAL KNEE ARTHROPLASTY;  Surgeon: Paralee Cancel, MD;  Location: [facility];  Service: Orthopedics;  Laterality: Left;    Social History   Social History  . Marital Status: Married    Spouse Name: N/A  . Number of Children: N/A  . Years of Education: N/A   Occupational History  . retired Pharmacist, hospital    Social History Main Topics  . Smoking status: Former Smoker -- 0.50 packs/day for 15 years    Types:  Cigarettes  . Smokeless tobacco: Never Used     Comment: smoked age [age], up to 1 ppd  . Alcohol Use: 1.8 oz/week    3 Glasses of wine per week     Comment: socially  . Drug Use: No  . Sexual Activity: Not Asked   Other Topics Concern  . None   Social History Narrative   2 children ages 49,35   Regular exercise no    Family History  Problem Relation Age of Onset  . Esophageal cancer Father   . Hyperlipidemia Mother   . Stroke Maternal Grandmother 66  . Diabetes Maternal Grandmother   . Heart attack Maternal Grandfather 93  . COPD Maternal Grandfather   . Heart attack Paternal Grandmother     8s  . Diabetes      MGGM    Review of Systems  Constitutional: Negative for fever.  Respiratory: Negative for cough, shortness of breath and wheezing.   Cardiovascular: Negative for chest pain, palpitations and leg swelling.  Neurological: Negative for light-headedness and headaches.       Objective:   Filed Vitals:   10/25/15 1453  BP: 164/90  Pulse: 94  Temp: 98 F (36.7 C)  Resp: 16   Filed Weights   10/25/15 1453  Weight: 199 lb (90.266 kg)   Body mass index is 31.64 kg/(m^2).   Physical Exam Constitutional: Appears well-developed and well-nourished. No distress.  Neck: Neck supple. No tracheal deviation present. No thyromegaly present.  No carotid bruit. No cervical adenopathy.   Cardiovascular: Normal rate, regular rhythm and normal heart sounds.   No murmur heard.  No edema Pulmonary/Chest: Effort normal and breath sounds normal. No respiratory distress. No wheezes.         Assessment & Plan:   See Problem List for Assessment and Plan of chronic medical problems.

## 2015-11-10 ENCOUNTER — Other Ambulatory Visit: Payer: Self-pay | Admitting: Internal Medicine

## 2015-11-22 ENCOUNTER — Ambulatory Visit (INDEPENDENT_AMBULATORY_CARE_PROVIDER_SITE_OTHER): Payer: Medicare Other | Admitting: Internal Medicine

## 2015-11-22 ENCOUNTER — Encounter: Payer: Self-pay | Admitting: Internal Medicine

## 2015-11-22 VITALS — BP 124/76 | HR 83 | Temp 98.7°F | Resp 16 | Ht 67.0 in | Wt 198.0 lb

## 2015-11-22 DIAGNOSIS — J302 Other seasonal allergic rhinitis: Secondary | ICD-10-CM | POA: Diagnosis not present

## 2015-11-22 DIAGNOSIS — G4700 Insomnia, unspecified: Secondary | ICD-10-CM

## 2015-11-22 DIAGNOSIS — J4520 Mild intermittent asthma, uncomplicated: Secondary | ICD-10-CM

## 2015-11-22 DIAGNOSIS — I10 Essential (primary) hypertension: Secondary | ICD-10-CM

## 2015-11-22 MED ORDER — ALBUTEROL SULFATE HFA 108 (90 BASE) MCG/ACT IN AERS
2.0000 | INHALATION_SPRAY | Freq: Four times a day (QID) | RESPIRATORY_TRACT | Status: DC | PRN
Start: 2015-11-22 — End: 2016-04-28

## 2015-11-22 MED ORDER — FLUTICASONE-SALMETEROL 115-21 MCG/ACT IN AERO: 2.0000 | INHALATION_SPRAY | Freq: Two times a day (BID) | RESPIRATORY_TRACT | Status: DC

## 2015-11-22 NOTE — Assessment & Plan Note (Signed)
BP well controlled Current regimen effective and well tolerated Continue current medications at current doses  

## 2015-11-22 NOTE — Assessment & Plan Note (Addendum)
She stopped allergy injections She uses the rescue inhaler only as needed - max of 2/week when working outside The advair she uses as needed - twice daily She takes xyzal daily - continue

## 2015-11-22 NOTE — Patient Instructions (Signed)
  Medications reviewed and updated.  No changes recommended at this time.  Your prescription(s) have been submitted to your pharmacy. Please take as directed and contact our office if you believe you are having problem(s) with the medication(s).    Please followup in 6 months   

## 2015-11-22 NOTE — Assessment & Plan Note (Addendum)
She uses the rescue inhaler only as needed - max of 2/week when working outside The advair she uses as needed - twice daily Overall controlled - ok to vary use of inhalers, just do not want to wait too long to start taking them regularly Continue current inhalers

## 2015-11-22 NOTE — Progress Notes (Signed)
Pre visit review using our clinic review tool, if applicable. No additional management support is needed unless otherwise documented below in the visit note. 

## 2015-11-22 NOTE — Progress Notes (Signed)
Subjective:    Patient ID: Erin Roach, female    DOB: February 20, 1941, 75 y.o.   MRN: [id number]  HPI She is here for follow up.  Hypertension: She is taking her medication daily. We increased her medication dose one month ago.  She is compliant with a low sodium diet.  She denies chest pain, palpitations, edema, shortness of breath and regular headaches. She does monitor her blood pressure at home on occasion and it has been controlled.    Insomnia:  She tried the trazodone and it did not work, so she stopped taking it.    Asthma, allergic rhinitis: She had allergy shots for 15 years and recently stopped them.  She is taking her allergy medication daily.  She uses her advair twice a day some days and often not at all. She uses the albuterol only as needed and it is never more than twice a week, which is usually when she is working outside.  She feels her allergies and asthma are well controlled.    RLS:  No longer as controlled.  She is following with Dr Elsworth Soho and will follow up with him.   Medications and allergies reviewed with patient and updated if appropriate.  Patient Active Problem List   Diagnosis Date Noted  . Essential hypertension 09/12/2015  . Contact dermatitis 09/12/2015  . Allergic rhinitis 07/17/2015  . Insomnia 07/17/2015  . Obese 04/10/2015  . S/P left TKA 04/09/2015  . Hyperglycemia 01/09/2015  . Restless leg 12/19/2013  . Osteopenia 12/04/2012  . GERD (gastroesophageal reflux disease) 02/07/2012  . Piriformis syndrome 01/07/2011  . FOOT PAIN, BILATERAL 07/17/2010  . 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 RESTLESS LEG SYNDROME 02/11/2009  . LEG EDEMA, BILATERAL 02/11/2009  . Hyperlipidemia 10/30/2008  . URINARY INCONTINENCE 10/30/2008  . SKIN CANCER, HX OF 10/30/2008  . Asthma 11/21/2007  . VENOUS INSUFFICIENCY, LEGS 11/22/2006  . SNORING 11/22/2006    Current Outpatient Prescriptions on File Prior to Visit  Medication Sig Dispense Refill  . furosemide  (LASIX) 20 MG tablet TAKE 1 TABLET BY MOUTH DAILY 30 tablet 2  . levocetirizine (XYZAL) 5 MG tablet TAKE 1 TABLET BY MOUTH EVERY EVENING. 30 tablet 2  . losartan (COZAAR) 100 MG tablet Take 1 tablet (100 mg total) by mouth daily. 30 tablet 3  . Multiple Vitamin (MULTIVITAMIN WITH MINERALS) TABS tablet Take 1 tablet by mouth daily.    [name] MYRBETRIQ 25 MG TB24 tablet TAKE 2 TABLETS (50 MG TOTAL) BY MOUTH DAILY. 60 tablet 5  . pramipexole (MIRAPEX) 0.25 MG tablet Take 2 tablet in the afternoon and 2 tablets after dinner. 120 tablet 5  . pregabalin (LYRICA) 75 MG capsule 75 mg with dinner and 150 mg at bedtime 270 capsule 1  . simvastatin (ZOCOR) 20 MG tablet TAKE 1 TABLET (20 MG TOTAL) BY MOUTH AT BEDTIME. 90 tablet 1  . traZODone (DESYREL) 50 MG tablet Take 1-2 tablets (50-100 mg total) by mouth at bedtime as needed for sleep. 60 tablet 5  . [DISCONTINUED] VESICARE 10 MG tablet TAKE 1 TABLET EVERY DAY 30 tablet 0   No current facility-administered medications on file prior to visit.    Past Medical History  Diagnosis Date  . Venous insufficiency     legs  . Leg edema     bilateral  . RLS (restless legs syndrome)   . Esophageal reflux   . Urinary incontinence   . Skin cancer     hx of squamous cell buttocks  area, Yevette Edwards MD,Gen Surgery: Amy Martinique MD, derm  . Asthma   . Snoring     no sleep study  . Heart murmur   . Hypertension   . Arthritis   . Essential hypertension 09/12/2015    Past Surgical History  Procedure Laterality Date  . Appendectomy    . Tonsillectomy    . Skin cancer removal      squamous cell interglureal space  . Ulnar nerve surgery      04/20/13:  pt denies  . Colonoscopy      neg; Dr Collene Mares  . Cystoscopy  2004    ? negative, Dr Jeffie Pollock  . Total knee arthroplasty Left 04/09/2015    Procedure: LEFT TOTAL KNEE ARTHROPLASTY;  Surgeon: Paralee Cancel, MD;  Location: [facility];  Service: Orthopedics;  Laterality: Left;    Social History   Social History  .  Marital Status: Married    Spouse Name: N/A  . Number of Children: N/A  . Years of Education: N/A   Occupational History  . retired Pharmacist, hospital    Social History Main Topics  . Smoking status: Former Smoker -- 0.50 packs/day for 15 years    Types: Cigarettes  . Smokeless tobacco: Never Used     Comment: smoked age [age], up to 1 ppd  . Alcohol Use: 1.8 oz/week    3 Glasses of wine per week     Comment: socially  . Drug Use: No  . Sexual Activity: Not on file   Other Topics Concern  . Not on file   Social History Narrative   2 children ages 14,35   Regular exercise no    Family History  Problem Relation Age of Onset  . Esophageal cancer Father   . Hyperlipidemia Mother   . Stroke Maternal Grandmother 66  . Diabetes Maternal Grandmother   . Heart attack Maternal Grandfather 93  . COPD Maternal Grandfather   . Heart attack Paternal Grandmother     49s  . Diabetes      MGGM    Review of Systems  Constitutional: Negative for fever.  Respiratory: Negative for shortness of breath.   Cardiovascular: Negative for chest pain, palpitations and leg swelling.  Neurological: Negative for dizziness, light-headedness and headaches.       Objective:   Filed Vitals:   11/22/15 1353  BP: 124/76  Pulse: 83  Temp: 98.7 F (37.1 C)  Resp: 16   Filed Weights   11/22/15 1353  Weight: 198 lb (89.812 kg)   Body mass index is 31 kg/(m^2).   Physical Exam Constitutional: Appears well-developed and well-nourished. No distress.  Neck: Neck supple. No tracheal deviation present. No thyromegaly present.  No carotid bruit. No cervical adenopathy.   Cardiovascular: Normal rate, regular rhythm and normal heart sounds.   No murmur heard.  trace edema Pulmonary/Chest: Effort normal and breath sounds normal. No respiratory distress. No wheezes.         Assessment & Plan:  '  See Problem List for Assessment and Plan of chronic medical problems.

## 2015-11-22 NOTE — Assessment & Plan Note (Signed)
Trazodone is not effective - she has stopped the medication

## 2015-12-01 ENCOUNTER — Other Ambulatory Visit: Payer: Self-pay | Admitting: Internal Medicine

## 2015-12-30 ENCOUNTER — Other Ambulatory Visit: Payer: Self-pay | Admitting: Neurology

## 2015-12-30 MED ORDER — PREGABALIN 75 MG PO CAPS: ORAL_CAPSULE | ORAL | 0 refills | Status: DC

## 2015-12-31 NOTE — Progress Notes (Deleted)
Erin Roach was seen today in neurologic consultation at the request of Binnie Rail, MD.  The consultation is for the evaluation of RLS.  This is a second opinion.  The patient has seen Dr. Gwenette Greet and I reviewed his records.  The patient has had restless leg syndrome about 40 years ago, when she was pregnant. Over the years, it came and went but it has been "bad" for the last 20 + years.  She has been on multiple medications.  She has been on Requip, clonazepam, Neupro (site reaction) and Horizant.  She is now on pramipexole, 0.25 mg, 2 tablets at bedtime.   She tried quinine a long time ago and it worked great but it is no longer available for RLS.  She also uses Vicodin for restless leg syndrome.  She takes one at night.  It helps.  Her grandfather and father had similar.    She reports that she has most of the symptoms at night but sometimes during the day.  She has a burning pain in the legs that can get severe.  She always has to move them.  She gets up and walks during the middle of the night.  Her medications seem to be inconsistent.    06/21/13 update:  The patient presents today for restless leg followup.  She did have an EMG since last visit that did not demonstrate any evidence of a large fiber peripheral neuropathy.  She is on pramipexole 0.25 mg, 2 tablets after dinner and 2 tablets at bedtime.  She called me a few days ago because the symptoms were increasing, and I gave her samples of Lyrica.  She tried Lyrica 75 mg twice a day and got just a little bit groggy, so decided to just take it at night, and thus far it is working well.  She does remain on the Vicodin, one tablet at night.  12/19/13 update:  The patient is seen today in followup.  She has a history of restless leg syndrome.  She is on a number of medications for this.  She is on Mirapex 0.25 mg and takes 2 after dinner and 2 at night.  She is also on Lyrica 75 mg 1 after dinner and 2 tablets at night in addition to hydrocodone  at bedtime.  She knows if she does not take the Lyrica after dinner, then she has significant symptoms.  She has never tried not taking the hydrocodone.  She does state that she is having some difficulty sleeping at night, but it is not necessarily related to the restless leg.  She did have a fall and fractured her patella and has been having some difficulty getting back since then.  She had to cancel a cruise because of it.  She is just now able to walk.  She had swelling in the legs.  She states that she has always had some degree of swelling, but not like currently.  She asks me if I would sign a form so that she can do the bicycling program at the [facility].  This is a program that has traditionally been for Parkinson's patients but she talked to the organizer and she was told that she could join if she had a medical release.  09/28/14 update:  The patient returns today for follow-up.  I have not seen her since August of last year.  She is on pramipexole 0.25 mg, 2 tablets after dinner and 2 at bedtime.  She is on Lyrica  75 mg, one tablet after dinner and 2 tablets at bedtime.  She generally takes one tablet of hydrocodone at night.  States that her RLS is under great control and is pleased.  Is having significant issues with her allergies.  Took albuterol right before coming in and now having palpitations.  Has appt with the allergist right after this appt.  She is SOB but does not feel faint.    07/04/15 update:  The patient is following up today.  I have not seen her since the end of May.  However, I did review prior records made available to me.  She remains on Mirapex 0.25 mg, 2 tablets after dinner and 2 tablets at bedtime.  She is also on Lyrica 75 mg after dinner and 150 mg at bedtime.  If she misses dosages after dinner, she will definitely note that.  I did note that Dr. Elsworth Soho has added clonazepam at bedtime, but that was no necessarily for restless leg.  It was because she was on Ambien for insomnia and  they thought that clonazepam was a better choice. She states that she has that at home but she doesn't usually take that or the [place].   She remains on one tablet of hydrocodone at bedtime for restless leg, but her new pulmonary sleep doctor has been somewhat reluctant to continue to prescribe this medication.  The patient did undergo a left total knee arthroplasty on 04/09/2015.  She did well with the surgery.  01/02/16 update:  The patient returns today for follow-up.  I have reviewed records made available to me.  She remains on pramipexole, 0.25 mg, 2 tablets after dinner and 2 at bedtime.  She is on Lyrica 75 mg, one after dinner and 2 at bedtime.  Lately, however, she thinks that restless leg is not as well controlled.  ***  PREVIOUS MEDICATIONS: n/a  ALLERGIES:  No Known Allergies  CURRENT MEDICATIONS:  Current Outpatient Prescriptions on File Prior to Visit  Medication Sig Dispense Refill  . albuterol (PROAIR HFA) 108 (90 Base) MCG/ACT inhaler Inhale 2 puffs into the lungs every 6 (six) hours as needed for wheezing or shortness of breath. 1 Inhaler 11  . fluticasone-salmeterol (ADVAIR HFA) 115-21 MCG/ACT inhaler Inhale 2 puffs into the lungs 2 (two) times daily. 1 Inhaler 11  . furosemide (LASIX) 20 MG tablet Take 1 tablet (20 mg total) by mouth daily. 30 tablet 1  . levocetirizine (XYZAL) 5 MG tablet TAKE 1 TABLET BY MOUTH EVERY EVENING. 30 tablet 2  . losartan (COZAAR) 100 MG tablet Take 1 tablet (100 mg total) by mouth daily. 30 tablet 3  . Multiple Vitamin (MULTIVITAMIN WITH MINERALS) TABS tablet Take 1 tablet by mouth daily.    [name] MYRBETRIQ 25 MG TB24 tablet TAKE 2 TABLETS (50 MG TOTAL) BY MOUTH DAILY. 60 tablet 5  . pramipexole (MIRAPEX) 0.25 MG tablet Take 2 tablet in the afternoon and 2 tablets after dinner. 120 tablet 5  . pregabalin (LYRICA) 75 MG capsule 75 mg with dinner and 150 mg at bedtime 90 capsule 0  . simvastatin (ZOCOR) 20 MG tablet TAKE 1 TABLET (20 MG TOTAL) BY MOUTH  AT BEDTIME. 90 tablet 1  . [DISCONTINUED] VESICARE 10 MG tablet TAKE 1 TABLET EVERY DAY 30 tablet 0   No current facility-administered medications on file prior to visit.     PAST MEDICAL HISTORY:   Past Medical History:  Diagnosis Date  . Arthritis   . Asthma   . Esophageal  reflux   . Essential hypertension 09/12/2015  . Heart murmur   . Hypertension   . Leg edema    bilateral  . RLS (restless legs syndrome)   . Skin cancer    hx of squamous cell buttocks area, Yevette Edwards MD,Gen Surgery: Amy Martinique MD, derm  . Snoring    no sleep study  . Urinary incontinence   . Venous insufficiency    legs    PAST SURGICAL HISTORY:   Past Surgical History:  Procedure Laterality Date  . APPENDECTOMY    . COLONOSCOPY     neg; Dr Collene Mares  . CYSTOSCOPY  2004   ? negative, Dr Jeffie Pollock  . skin cancer removal     squamous cell interglureal space  . TONSILLECTOMY    . 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 Ulnar Nerve Surgery     04/20/13:  pt denies    SOCIAL HISTORY:   Social History   Social History  . Marital status: Married    Spouse name: N/A  . Number of children: N/A  . Years of education: N/A   Occupational History  . retired Pharmacist, hospital    Social History Main Topics  . Smoking status: Former Smoker    Packs/day: 0.50    Years: 15.00    Types: Cigarettes  . Smokeless tobacco: Never Used     Comment: smoked age [age], up to 1 ppd  . Alcohol use 1.8 oz/week    3 Glasses of wine per week     Comment: socially  . Drug use: No  . Sexual activity: Not on file   Other Topics Concern  . Not on file   Social History Narrative   2 children ages 3,35   Regular exercise no    FAMILY HISTORY:   Family Status  Relation Status  . Father Deceased at age 70   rls, esophageal CA  . Mother Alive   healthy  . Brother Alive   healthy  .  Sister Alive   healthy  . Child Alive   2, alive and well    ROS:  A complete 10 system review of systems was obtained and was unremarkable apart from what is mentioned above.  PHYSICAL EXAMINATION:    VITALS:   There were no vitals filed for this visit. Wt Readings from Last 3 Encounters:  11/22/15 198 lb (89.8 kg)  10/25/15 199 lb (90.3 kg)  10/16/15 196 lb 3.2 oz (89 kg)     GEN:  Normal appears female in no acute distress.  Appears younger than stated age 27:  Normocephalic, atraumatic. The mucous membranes are moist. The superficial temporal arteries are without ropiness or tenderness. Cardiovascular: Tachycardic.  Regular rhythm.  3/6 SEM.  No conversational dyspnea Lungs: Clear to auscultation bilaterally. Neck/Heme: There are no carotid bruits noted bilaterally.   NEUROLOGICAL: Orientation:  The patient is alert and oriented x 3.  Fund of knowledge is appropriate.  Recent and remote memory intact.  Attention span and concentration normal.  Repeats and names without difficulty. Cranial nerves: There is good facial symmetry.  There is mild L ptosis vs pseudoptosis.  The pupils are equal round and reactive to light bilaterally. Fundoscopic exam reveals clear disc margins bilaterally. Extraocular muscles are intact and visual fields are full to confrontational testing. Speech is fluent and clear. Soft palate rises symmetrically and  there is no tongue deviation. Hearing is intact to conversational tone. Tone: Tone is good throughout. Sensation: Sensation is intact to light touch throughout Coordination:  The patient has no difficulty with RAM's or FNF bilaterally. Motor: Strength is 5/5 in the bilateral upper and lower extremities.  Shoulder shrug is equal and symmetric. There is no pronator drift.  There are no fasciculations noted.   Lab Results  Component Value Date   [id number] 04/20/2013   Lab Results  Component Value Date   HGBA1C 5.2 01/08/2015      Chemistry      Component Value Date/Time   NA 142 06/21/2015 1543   K 3.6 06/21/2015 1543   CL 105 06/21/2015 1543   CO2 28 06/21/2015 1543   BUN 15 06/21/2015 1543   CREATININE 0.62 06/21/2015 1543      Component Value Date/Time   CALCIUM 9.4 06/21/2015 1543   ALKPHOS 75 06/21/2015 1543   AST 21 06/21/2015 1543   ALT 25 06/21/2015 1543   BILITOT 0.3 06/21/2015 1543     Lab Results  Component Value Date   WBC 13.3 (H) 04/10/2015   HGB 11.4 (L) 04/10/2015   HCT 33.2 (L) 04/10/2015   MCV 86.2 04/10/2015   PLT 223 04/10/2015   Lab Results  Component Value Date   TSH 3.23 10/18/2014   Lab Results  Component Value Date   IRON 46 03/19/2015   IRON 49 03/19/2015   TIBC 333 03/19/2015   FERRITIN 47.1 03/19/2015      IMPRESSION/PLAN  1. RLS  -The pt has a long hx and a strong fam hx of RLS  -She will remain on the mirapex 0.25 mg - 2 after dinner and 2 at bedtime.  We did talk about the phenomenon of augmentation, specifically associated with these dopamine agonists, but decided to stay on the Mirapex for now.  She really has been stable for a long time and I am encouraged by this.  -She seems to be doing well with the Lyrica 75 mg, one after dinner and 2 tablets per night and can continue on that.  A prescription was written for that.  -***since RLS not as well controlled, should check CBC, and iron stores.  Was mildly anemic in nov when last checked  -In the future, if we continue to struggle, then we will consider adding Klonopin back again (has it at home but not using), trying Topamax or even levodopa.  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   2. followup in 6 months.

## 2016-01-02 ENCOUNTER — Ambulatory Visit: Payer: Medicare Other | Admitting: Neurology

## 2016-01-22 ENCOUNTER — Ambulatory Visit: Payer: Medicare Other | Admitting: Internal Medicine

## 2016-01-27 ENCOUNTER — Other Ambulatory Visit: Payer: Self-pay | Admitting: Internal Medicine

## 2016-02-03 ENCOUNTER — Telehealth: Payer: Self-pay | Admitting: Neurology

## 2016-02-03 MED ORDER — PREGABALIN 75 MG PO CAPS: ORAL_CAPSULE | ORAL | 0 refills | Status: DC

## 2016-02-03 NOTE — Telephone Encounter (Signed)
RX sent. Patient made aware 30 day supply sent to pharmacy.

## 2016-02-03 NOTE — Telephone Encounter (Signed)
Patient last seen in February. She was given a 30 day supply of Lyrica in August and told she needed to keep appt for any further refills. She cancelled 01/02/16 appt. She is now scheduled in October. Please advise.

## 2016-02-03 NOTE — Telephone Encounter (Signed)
Erin Roach 12/23/2040. She needs a 90 Day supply of Lyrica. Her # is  [phone]. She uses [facility]  on [address]  [id number]. She said she has contacted the Pharmacy as well. Thank you

## 2016-02-03 NOTE — Telephone Encounter (Signed)
Just until appt.

## 2016-02-11 ENCOUNTER — Other Ambulatory Visit: Payer: Self-pay | Admitting: Internal Medicine

## 2016-02-13 ENCOUNTER — Other Ambulatory Visit: Payer: Self-pay | Admitting: Internal Medicine

## 2016-02-18 NOTE — Progress Notes (Signed)
Erin Roach was seen today in neurologic consultation at the request of Binnie Rail, MD.  The consultation is for the evaluation of RLS.  This is a second opinion.  The patient has seen Dr. Gwenette Greet and I reviewed his records.  The patient has had restless leg syndrome about 40 years ago, when she was pregnant. Over the years, it came and went but it has been "bad" for the last 20 + years.  She has been on multiple medications.  She has been on Requip, clonazepam, Neupro (site reaction) and Horizant.  She is now on pramipexole, 0.25 mg, 2 tablets at bedtime.   She tried quinine a long time ago and it worked great but it is no longer available for RLS.  She also uses Vicodin for restless leg syndrome.  She takes one at night.  It helps.  Her grandfather and father had similar.    She reports that she has most of the symptoms at night but sometimes during the day.  She has a burning pain in the legs that can get severe.  She always has to move them.  She gets up and walks during the middle of the night.  Her medications seem to be inconsistent.    06/21/13 update:  The patient presents today for restless leg followup.  She did have an EMG since last visit that did not demonstrate any evidence of a large fiber peripheral neuropathy.  She is on pramipexole 0.25 mg, 2 tablets after dinner and 2 tablets at bedtime.  She called me a few days ago because the symptoms were increasing, and I gave her samples of Lyrica.  She tried Lyrica 75 mg twice a day and got just a little bit groggy, so decided to just take it at night, and thus far it is working well.  She does remain on the Vicodin, one tablet at night.  12/19/13 update:  The patient is seen today in followup.  She has a history of restless leg syndrome.  She is on a number of medications for this.  She is on Mirapex 0.25 mg and takes 2 after dinner and 2 at night.  She is also on Lyrica 75 mg 1 after dinner and 2 tablets at night in addition to hydrocodone  at bedtime.  She knows if she does not take the Lyrica after dinner, then she has significant symptoms.  She has never tried not taking the hydrocodone.  She does state that she is having some difficulty sleeping at night, but it is not necessarily related to the restless leg.  She did have a fall and fractured her patella and has been having some difficulty getting back since then.  She had to cancel a cruise because of it.  She is just now able to walk.  She had swelling in the legs.  She states that she has always had some degree of swelling, but not like currently.  She asks me if I would sign a form so that she can do the bicycling program at the [facility].  This is a program that has traditionally been for Parkinson's patients but she talked to the organizer and she was told that she could join if she had a medical release.  09/28/14 update:  The patient returns today for follow-up.  I have not seen her since August of last year.  She is on pramipexole 0.25 mg, 2 tablets after dinner and 2 at bedtime.  She is on Lyrica  75 mg, one tablet after dinner and 2 tablets at bedtime.  She generally takes one tablet of hydrocodone at night.  States that her RLS is under great control and is pleased.  Is having significant issues with her allergies.  Took albuterol right before coming in and now having palpitations.  Has appt with the allergist right after this appt.  She is SOB but does not feel faint.    07/04/15 update:  The patient is following up today.  I have not seen her since the end of May.  However, I did review prior records made available to me.  She remains on Mirapex 0.25 mg, 2 tablets after dinner and 2 tablets at bedtime.  She is also on Lyrica 75 mg after dinner and 150 mg at bedtime.  If she misses dosages after dinner, she will definitely note that.  I did note that Dr. Elsworth Soho has added clonazepam at bedtime, but that was no necessarily for restless leg.  It was because she was on Ambien for insomnia and  they thought that clonazepam was a better choice. She states that she has that at home but she doesn't usually take that or the [place].   She remains on one tablet of hydrocodone at bedtime for restless leg, but her new pulmonary sleep doctor has been somewhat reluctant to continue to prescribe this medication.  The patient did undergo a left total knee arthroplasty on 04/09/2015.  She did well with the surgery.  02/20/16 update:  The patient follows up today.  It has been about 8 months since I have seen her.  She has a history of restless leg and is on pramipexole 0.25 mg, 2 tablets after dinner and 2 tablets at bedtime.  She is on Lyrica, 75 mg at dinner and 150 mg at bedtime. She is off of hydrocodone.  States that she is using relaxis and states that it has been very helpful.  She even tried to cut back on the lyrica but her sx's seemed to come back.  PREVIOUS MEDICATIONS: n/a  ALLERGIES:  No Known Allergies  CURRENT MEDICATIONS:  Current Outpatient Prescriptions on File Prior to Visit  Medication Sig Dispense Refill  . albuterol (PROAIR HFA) 108 (90 Base) MCG/ACT inhaler Inhale 2 puffs into the lungs every 6 (six) hours as needed for wheezing or shortness of breath. 1 Inhaler 11  . fluticasone-salmeterol (ADVAIR HFA) 115-21 MCG/ACT inhaler Inhale 2 puffs into the lungs 2 (two) times daily. 1 Inhaler 11  . furosemide (LASIX) 20 MG tablet TAKE ONE TABLET BY MOUTH DAILY 30 tablet 5  . levocetirizine (XYZAL) 5 MG tablet TAKE 1 TABLET BY MOUTH EVERY EVENING. 30 tablet 5  . losartan (COZAAR) 100 MG tablet Take 1 tablet (100 mg total) by mouth daily. 30 tablet 3  . Multiple Vitamin (MULTIVITAMIN WITH MINERALS) TABS tablet Take 1 tablet by mouth daily.    [name] MYRBETRIQ 25 MG TB24 tablet TAKE 2 TABLETS (50 MG TOTAL) BY MOUTH DAILY. 60 tablet 4  . pramipexole (MIRAPEX) 0.25 MG tablet Take 2 tablet in the afternoon and 2 tablets after dinner. 120 tablet 5  . pregabalin (LYRICA) 75 MG capsule 75 mg with  dinner and 150 mg at bedtime 90 capsule 0  . simvastatin (ZOCOR) 20 MG tablet TAKE 1 TABLET (20 MG TOTAL) BY MOUTH AT BEDTIME. 90 tablet 1  . [DISCONTINUED] VESICARE 10 MG tablet TAKE 1 TABLET EVERY DAY 30 tablet 0   No current facility-administered medications on file  prior to visit.     PAST MEDICAL HISTORY:   Past Medical History:  Diagnosis Date  . Arthritis   . Asthma   . Esophageal reflux   . Essential hypertension 09/12/2015  . Heart murmur   . Hypertension   . Leg edema    bilateral  . RLS (restless legs syndrome)   . Skin cancer    hx of squamous cell buttocks area, Yevette Edwards MD,Gen Surgery: Amy Martinique MD, derm  . Snoring    no sleep study  . Urinary incontinence   . Venous insufficiency    legs    PAST SURGICAL HISTORY:   Past Surgical History:  Procedure Laterality Date  . APPENDECTOMY    . COLONOSCOPY     neg; Dr Collene Mares  . CYSTOSCOPY  2004   ? negative, Dr Jeffie Pollock  . skin cancer removal     squamous cell interglureal space  . TONSILLECTOMY    . 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 Ulnar Nerve Surgery     04/20/13:  pt denies    SOCIAL HISTORY:   Social History   Social History  . Marital status: Married    Spouse name: N/A  . Number of children: N/A  . Years of education: N/A   Occupational History  . retired Pharmacist, hospital    Social History Main Topics  . Smoking status: Former Smoker    Packs/day: 0.50    Years: 15.00    Types: Cigarettes  . Smokeless tobacco: Never Used     Comment: smoked age [age], up to 1 ppd  . Alcohol use 1.8 oz/week    3 Glasses of wine per week     Comment: socially  . Drug use: No  . Sexual activity: Not on file   Other Topics Concern  . Not on file   Social History Narrative   2 children ages 41,35   Regular exercise no    FAMILY HISTORY:   Family Status  Relation Status    . Father Deceased at age 79   rls, esophageal CA  . Mother Alive   healthy  . Brother Alive   healthy  . Sister Alive   healthy  . Child Alive   2, alive and well    ROS:  A complete 10 system review of systems was obtained and was unremarkable apart from what is mentioned above.  PHYSICAL EXAMINATION:    VITALS:   There were no vitals filed for this visit. Wt Readings from Last 3 Encounters:  11/22/15 198 lb (89.8 kg)  10/25/15 199 lb (90.3 kg)  10/16/15 196 lb 3.2 oz (89 kg)     GEN:  Normal appears female in no acute distress.  Appears younger than stated age 62:  Normocephalic, atraumatic. The mucous membranes are moist. The superficial temporal arteries are without ropiness or tenderness. Cardiovascular: Tachycardic.  Regular rhythm.  3/6 SEM.  No conversational dyspnea Lungs: Clear to auscultation bilaterally. Neck/Heme: There are no carotid bruits noted bilaterally.   NEUROLOGICAL: Orientation:  The patient is alert and oriented x 3.  Fund of knowledge is appropriate.  Recent and remote memory intact.  Attention span and concentration normal.  Repeats and names without difficulty. Cranial nerves: There is good facial symmetry.  There is mild L ptosis vs pseudoptosis.  The pupils are equal round and reactive to light  bilaterally. Fundoscopic exam reveals clear disc margins bilaterally. Extraocular muscles are intact and visual fields are full to confrontational testing. Speech is fluent and clear. Soft palate rises symmetrically and there is no tongue deviation. Hearing is intact to conversational tone. Tone: Tone is good throughout. Sensation: Sensation is intact to light touch throughout Coordination:  The patient has no difficulty with RAM's or FNF bilaterally. Motor: Strength is 5/5 in the bilateral upper and lower extremities.  Shoulder shrug is equal and symmetric. There is no pronator drift.  There are no fasciculations noted.   Lab Results  Component Value  Date   [id number] 04/20/2013   Lab Results  Component Value Date   HGBA1C 5.2 01/08/2015     Chemistry      Component Value Date/Time   NA 142 06/21/2015 1543   K 3.6 06/21/2015 1543   CL 105 06/21/2015 1543   CO2 28 06/21/2015 1543   BUN 15 06/21/2015 1543   CREATININE 0.62 06/21/2015 1543      Component Value Date/Time   CALCIUM 9.4 06/21/2015 1543   ALKPHOS 75 06/21/2015 1543   AST 21 06/21/2015 1543   ALT 25 06/21/2015 1543   BILITOT 0.3 06/21/2015 1543     Lab Results  Component Value Date   WBC 13.3 (H) 04/10/2015   HGB 11.4 (L) 04/10/2015   HCT 33.2 (L) 04/10/2015   MCV 86.2 04/10/2015   PLT 223 04/10/2015   Lab Results  Component Value Date   TSH 3.23 10/18/2014   Lab Results  Component Value Date   IRON 46 03/19/2015   IRON 49 03/19/2015   TIBC 333 03/19/2015   FERRITIN 47.1 03/19/2015      IMPRESSION/PLAN  1. RLS  -The pt has a long hx and a strong fam hx of RLS  -She will remain on the mirapex 0.25 mg - 2 after dinner and 2 at bedtime.  We did talk about the phenomenon of augmentation, specifically associated with these dopamine agonists, but decided to stay on the Mirapex for now as she has several trips coming up.  She really has been stable for a long time and I am encouraged by this.  -She seems to be doing well with the Lyrica 75 mg, one after dinner and 2 tablets per night and can continue on that.  A prescription was written for that.  -she is using the relaxis system and thinks that has been a huge help.  Told her she may be able to cut back on mirapex but she wants to hold until vacations are over.  -In the future, if we continue to struggle, then we will consider adding Klonopin back again (has it at home but not using), trying Topamax or even levodopa.  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    2. followup in 6  months.

## 2016-02-19 ENCOUNTER — Other Ambulatory Visit: Payer: Self-pay | Admitting: Internal Medicine

## 2016-02-20 ENCOUNTER — Encounter: Payer: Self-pay | Admitting: Neurology

## 2016-02-20 ENCOUNTER — Ambulatory Visit (INDEPENDENT_AMBULATORY_CARE_PROVIDER_SITE_OTHER): Payer: Medicare Other | Admitting: Neurology

## 2016-02-20 VITALS — BP 130/80 | HR 105 | Ht 66.0 in | Wt 201.0 lb

## 2016-02-20 DIAGNOSIS — G2581 Restless legs syndrome: Secondary | ICD-10-CM | POA: Diagnosis not present

## 2016-02-20 MED ORDER — PREGABALIN 75 MG PO CAPS: ORAL_CAPSULE | ORAL | 1 refills | Status: DC

## 2016-02-20 MED ORDER — PRAMIPEXOLE DIHYDROCHLORIDE 0.25 MG PO TABS: ORAL_TABLET | ORAL | 1 refills | Status: DC

## 2016-02-25 ENCOUNTER — Other Ambulatory Visit: Payer: Self-pay | Admitting: Neurology

## 2016-03-19 NOTE — Progress Notes (Signed)
Cardiology Office Note    Date:  03/20/2016  ID:  ITZURI BOUDRIE, Alferd Apa 10/22/1940, MRN [id number] PCP:  Binnie Rail, MD  Cardiologist:  Remotely saw Dr. Angelena Form. Reviewed with Dr. Saunders Revel today.   Chief Complaint: chest pain  History of Present Illness:  QUINEISHA PHENICIE is a 75 y.o. female with history of HTN, obesity, GERD, hyperlipidemia, asthma, venous insufficiency, RLS, carotid disease who presents for evaluation of chest pain.  Saw Dr. Angelena Form in 2010 at which time [facility] 01/2009 had shown normal cors and normal EF. 2D echo 2010: EF 75-80%, hyperdynamic LV with near cavity oblieration, mild LVH, grade 1 DD. Nuc 01/2012: normal, no evidence of ischemia, EF 73% (I proctored this). Carotid duplex 09/2012: [id number] RICA, [id number] LICA. + Family history of CAD as noted below, former tobacco abuse for about 10 years.  She presents for evaluation of CP. She was visiting the beach a few days ago when she woke up at night with sensation of chest discomfort radiating down her arms and elbows. She thought it felt like reflux but it persisted so she eventually went to the ER. The discomfort was not worse with exertion, inspiration, or palpation. She was given 4 baby ASA which she feels like helped. The pain eventually resolved after 4 hours. By paperwork from the [facility] ED 03/17/16, she had negative troponins, Na 140, K 3.7, Cl 108, BUN 14, Cr 0.82, glucose 141, calcium 8.9, Mg 2.1, albumin 3.3, Tprot 6.8, AST 23, ALT 34, AP 77. WBC 8.1, Hgb 13.4. D-dimer was 1.45 prompting CTA which was negative for PE, no acute abnormality otherwise. CXR was normal with normal heart size and clear lungs without infiltrate. She has since noticed occasional episodes of the discomfort lasting a few minutes after eating dinner, but no other meals. She has had some lumbar spine issues so was instructed to avoid the treadmill but has been doing the exercise bike 3x a week without any angina or SOB. No palpitations, bleeding, syncope  or other unusual symptoms. With regard to her spine issues, she is going to have a trial of an epidural then proceed with some kind of nerve stimulator if the epidural is unsuccessful.  She reports in the past her GERD was well-controlled on Nexium but she has not been on this in quite some time.  Past Medical History:  Diagnosis Date  . Arthritis   . Asthma   . Carotid artery disease ([place])    a. Carotid duplex 09/2012: 123456 RICA, [id number] LICA..  . Chest pain    a. normal coronaries 2010.  [name] Esophageal reflux   . Essential hypertension 09/12/2015  . Heart murmur   . Hyperlipidemia   . Hypertension   . Leg edema    bilateral  . LVH (left ventricular hypertrophy)    a.  2D echo 2010: EF 75-80%, hyperdynamic LV with near cavity oblieration, mild LVH, grade 1 DD.  [name] Obesity   . RLS (restless legs syndrome)   . Skin cancer    hx of squamous cell buttocks area, Yevette Edwards MD,Gen Surgery: Amy Martinique MD, derm  . Snoring    no sleep study  . Urinary incontinence   . Venous insufficiency    legs    Past Surgical History:  Procedure Laterality Date  . APPENDECTOMY    . COLONOSCOPY     neg; Dr Collene Mares  . CYSTOSCOPY  2004   ? negative, Dr Jeffie Pollock  . skin cancer removal  squamous cell interglureal space  . TONSILLECTOMY    . 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 Ulnar Nerve Surgery     04/20/13:  pt denies    Current Medications: Current Outpatient Prescriptions  Medication Sig Dispense Refill  . albuterol (PROAIR HFA) 108 (90 Base) MCG/ACT inhaler Inhale 2 puffs into the lungs every 6 (six) hours as needed for wheezing or shortness of breath. 1 Inhaler 11  . furosemide (LASIX) 20 MG tablet TAKE ONE TABLET BY MOUTH DAILY 30 tablet 5  . levocetirizine (XYZAL) 5 MG tablet TAKE 1 TABLET BY MOUTH EVERY EVENING. 30 tablet 5  . losartan (COZAAR) 100  MG tablet TAKE 1 TABLET BY MOUTH DAILY. 30 tablet 5  . MYRBETRIQ 25 MG TB24 tablet TAKE 2 TABLETS (50 MG TOTAL) BY MOUTH DAILY. 60 tablet 4  . pramipexole (MIRAPEX) 0.25 MG tablet TAKE 2 TABLETS BY MOUTH IN THE AFTERNOON AND 2 TABLETS AFTER DINNER 120 tablet 5  . pregabalin (LYRICA) 75 MG capsule 75 mg with dinner and 150 mg at bedtime 270 capsule 1  . simvastatin (ZOCOR) 20 MG tablet TAKE 1 TABLET (20 MG TOTAL) BY MOUTH AT BEDTIME. 90 tablet 1  . esomeprazole (NEXIUM) 40 MG capsule Take 1 capsule (40 mg total) by mouth daily at 12 noon. 90 capsule 1   No current facility-administered medications for this visit.      Allergies:   Patient has no known allergies.   Social History   Social History  . Marital status: Married    Spouse name: N/A  . Number of children: N/A  . Years of education: N/A   Occupational History  . retired Pharmacist, hospital    Social History Main Topics  . Smoking status: Former Smoker    Packs/day: 0.50    Years: 15.00    Types: Cigarettes  . Smokeless tobacco: Never Used     Comment: smoked age [age], up to 1 ppd  . Alcohol use 1.8 oz/week    3 Glasses of wine per week     Comment: socially  . Drug use: No  . Sexual activity: Not Asked   Other Topics Concern  . None   Social History Narrative   2 children ages 62,35   Regular exercise no     Family History:  The patient's family history includes CAD in her sister; COPD in her maternal grandfather; Diabetes in her maternal grandmother; Esophageal cancer in her father; Heart attack in her paternal grandmother; Heart attack (age of onset: 8) in her maternal grandfather; Heart disease in her father; Hyperlipidemia in her mother; Stroke (age of onset: 69) in her maternal grandmother.   ROS:   Please see the history of present illness. Chronic back pain All other systems are reviewed and otherwise negative.    PHYSICAL EXAM:   VS:  BP 136/80 (BP Location: Left Arm, Patient Position: Sitting, Cuff Size:  Large)   Pulse 72   Ht 5\' 6"  (1.676 m)   Wt 206 lb 8 oz (93.7 kg)   BMI 33.33 kg/m   BMI: Body mass index is 33.33 kg/m. GEN: Well nourished, well developed WF in no acute distress  HEENT: normocephalic, atraumatic Neck: no JVD, carotid bruits, or masses Cardiac: RRR; no murmurs, rubs, or gallops, no edema  Respiratory:  clear to auscultation bilaterally, normal work of breathing GI: soft, nontender, nondistended, +  BS MS: no deformity or atrophy  Skin: warm and dry, no rash Neuro:  Alert and Oriented x 3, Strength and sensation are intact, follows commands Psych: euthymic mood, full affect  Wt Readings from Last 3 Encounters:  03/20/16 206 lb 8 oz (93.7 kg)  02/20/16 201 lb (91.2 kg)  11/22/15 198 lb (89.8 kg)      Studies/Labs Reviewed:   EKG:  EKG was ordered today and personally reviewed by me and demonstrates NSR 88bpm, left anterior fasicular block, cannot rule out prior anterior infarct  Recent Labs: 04/10/2015: Hemoglobin 11.4; Platelets 223 06/21/2015: ALT 25; BUN 15; Creatinine, Ser 0.62; Potassium 3.6; Sodium 142   Lipid Panel    Component Value Date/Time   CHOL 212 (H) 03/19/2015 1445   TRIG 136.0 03/19/2015 1445   TRIG 101 05/19/2006 0832   HDL 58.80 03/19/2015 1445   CHOLHDL 4 03/19/2015 1445   VLDL 27.2 03/19/2015 1445   LDLCALC 126 (H) 03/19/2015 1445   LDLDIRECT 141.3 09/06/2012 0907    Additional studies/ records that were reviewed today include: Summarized above.    ASSESSMENT & PLAN:   The patient's case was reviewed in detail with Dr. Saunders Revel since this was a new patient to re-establish care in our office. The plan was formulated per our discussion.  1. Chest pain - mixed typical/atypical features. No evidence for ischemia by bloodwork in ED. CTA was neg for PE or other acute abnormality. Would recommend to proceed with Lexiscan nuclear stress test. Pt has history of asthma but this has been well-controlled without any dyspnea or wheezing lately.  I asked her to bring her inhaler to the appointment as a precaution. Did not order treadmill due to her recent spine issues. Will also empirically resume Nexium 40mg  daily for possible contribution from known GERD. 2. Essential HTN - continue current regimen. 3. Hyperlipidemia - followed by primary care. Continue statin. If her eventual workup reveals any CAD, will need escalation of statin. 4. LVH - no exertional symptoms. Follow clinically and keep an eye on BP. 5. Carotid artery disease - overdue for repeat duplex. Will order.  Disposition: F/u with me in 4 weeks.   Medication Adjustments/Labs and Tests Ordered: Current medicines are reviewed at length with the patient today.  Concerns regarding medicines are outlined above. Medication changes, Labs and Tests ordered today are summarized above and listed in the Patient Instructions accessible in Encounters.   Raechel Ache PA-C  03/20/2016 11:24 AM    [address], [place], [place]  [address] Phone: [phone]; Fax: [phone]

## 2016-03-20 ENCOUNTER — Ambulatory Visit (INDEPENDENT_AMBULATORY_CARE_PROVIDER_SITE_OTHER): Payer: Medicare Other | Admitting: Physician Assistant

## 2016-03-20 ENCOUNTER — Encounter: Payer: Self-pay | Admitting: Physician Assistant

## 2016-03-20 VITALS — BP 136/80 | HR 72 | Ht 66.0 in | Wt 206.5 lb

## 2016-03-20 DIAGNOSIS — I517 Cardiomegaly: Secondary | ICD-10-CM

## 2016-03-20 DIAGNOSIS — I739 Peripheral vascular disease, unspecified: Secondary | ICD-10-CM

## 2016-03-20 DIAGNOSIS — E785 Hyperlipidemia, unspecified: Secondary | ICD-10-CM | POA: Diagnosis not present

## 2016-03-20 DIAGNOSIS — R079 Chest pain, unspecified: Secondary | ICD-10-CM | POA: Diagnosis not present

## 2016-03-20 DIAGNOSIS — I10 Essential (primary) hypertension: Secondary | ICD-10-CM

## 2016-03-20 DIAGNOSIS — I779 Disorder of arteries and arterioles, unspecified: Secondary | ICD-10-CM

## 2016-03-20 MED ORDER — ESOMEPRAZOLE MAGNESIUM 40 MG PO CPDR: 40.0000 mg | DELAYED_RELEASE_CAPSULE | Freq: Every day | ORAL | 1 refills | Status: DC

## 2016-03-20 NOTE — Patient Instructions (Signed)
Medication Instructions:  Your physician has recommended you make the following change in your medication:  1.  START Nexium 40 mg taking 1 tablet daily   Labwork: None ordered  Testing/Procedures: Your physician has requested that you have a lexiscan myoview. For further information please visit [email or web address]. Please follow instruction sheet, as given.  Your physician has requested that you have a carotid duplex. This test is an ultrasound of the carotid arteries in your neck. It looks at blood flow through these arteries that supply the brain with blood. Allow one hour for this exam. There are no restrictions or special instructions.   Follow-Up: Your physician recommends that you schedule a follow-up appointment in: [place], PA-C   Any Other Special Instructions Will Be Listed Below (If Applicable).  Pharmacologic Stress Electrocardiogram A pharmacologic stress electrocardiogram is a heart (cardiac) test that uses nuclear imaging to evaluate the blood supply to your heart. This test may also be called a pharmacologic stress electrocardiography. Pharmacologic means that a medicine is used to increase your heart rate and blood pressure.  This stress test is done to find areas of poor blood flow to the heart by determining the extent of coronary artery disease (CAD). Some people exercise on a treadmill, which naturally increases the blood flow to the heart. For those people unable to exercise on a treadmill, a medicine is used. This medicine stimulates your heart and will cause your heart to beat harder and more quickly, as if you were exercising.  Pharmacologic stress tests can help determine:  The adequacy of blood flow to your heart during increased levels of activity in order to clear you for discharge home.  The extent of coronary artery blockage caused by CAD.  Your prognosis if you have suffered a heart attack.  The effectiveness of cardiac procedures done,  such as an angioplasty, which can increase the circulation in your coronary arteries.  Causes of chest pain or pressure. LET [facility] CARE PROVIDER KNOW ABOUT:  Any allergies you have.  All medicines you are taking, including vitamins, herbs, eye drops, creams, and over-the-counter medicines.  Previous problems you or members of your family have had with the use of anesthetics.  Any blood disorders you have.  Previous surgeries you have had.  Medical conditions you have.  Possibility of pregnancy, if this applies.  If you are currently breastfeeding. RISKS AND COMPLICATIONS Generally, this is a safe procedure. However, as with any procedure, complications can occur. Possible complications include:  You develop pain or pressure in the following areas:  Chest.  Jaw or neck.  Between your shoulder blades.  Radiating down your left arm.  Headache.  Dizziness or light-headedness.  Shortness of breath.  Increased or irregular heartbeat.  Low blood pressure.  Nausea or vomiting.  Flushing.  Redness going up the arm and slight pain during injection of medicine.  Heart attack (rare). BEFORE THE PROCEDURE   Avoid all forms of caffeine for 24 hours before your test or as directed by your health care provider. This includes coffee, tea (even decaffeinated tea), caffeinated sodas, chocolate, cocoa, and certain pain medicines.  Follow your health care provider's instructions regarding eating and drinking before the test.  Take your medicines as directed at regular times with water unless instructed otherwise. Exceptions may include:  If you have diabetes, ask how you are to take your insulin or pills. It is common to adjust insulin dosing the morning of the test.  If you are  taking beta-blocker medicines, it is important to talk to your health care provider about these medicines well before the date of your test. Taking beta-blocker medicines may interfere with the  test. In some cases, these medicines need to be changed or stopped 24 hours or more before the test.  If you wear a nitroglycerin patch, it may need to be removed prior to the test. Ask your health care provider if the patch should be removed before the test.  If you use an inhaler for any breathing condition, bring it with you to the test.  If you are an outpatient, bring a snack so you can eat right after the stress phase of the test.  Do not smoke for 4 hours prior to the test or as directed by your health care provider.  Do not apply lotions, powders, creams, or oils on your chest prior to the test.  Wear comfortable shoes and clothing. Let your health care provider know if you were unable to complete or follow the preparations for your test. PROCEDURE   Multiple patches (electrodes) will be put on your chest. If needed, small areas of your chest may be shaved to get better contact with the electrodes. Once the electrodes are attached to your body, multiple wires will be attached to the electrodes, and your heart rate will be monitored.  An IV access will be started. A nuclear trace (isotope) is given. The isotope may be given intravenously, or it may be swallowed. Nuclear refers to several types of radioactive isotopes, and the nuclear isotope lights up the arteries so that the nuclear images are clear. The isotope is absorbed by your body. This results in low radiation exposure.  A resting nuclear image is taken to show how your heart functions at rest.  A medicine is given through the IV access.  A second scan is done about 1 hour after the medicine injection and determines how your heart functions under stress.  During this stress phase, you will be connected to an electrocardiogram machine. Your blood pressure and oxygen levels will be monitored. AFTER THE PROCEDURE   Your heart rate and blood pressure will be monitored after the test.  You may return to your normal schedule,  including diet,activities, and medicines, unless your health care provider tells you otherwise.   This information is not intended to replace advice given to you by your health care provider. Make sure you discuss any questions you have with your health care provider.   Document Released: 09/13/2008 Document Revised: 05/02/2013 Document Reviewed: 01/02/2013 Elsevier Interactive Patient Education [employer].      If you need a refill on your cardiac medications before your next appointment, please call your pharmacy.

## 2016-03-26 ENCOUNTER — Telehealth (HOSPITAL_COMMUNITY): Payer: Self-pay | Admitting: *Deleted

## 2016-03-26 NOTE — Telephone Encounter (Signed)
Left message on voicemail per DPR in reference to upcoming appointment scheduled on 03/31/16 with detailed instructions given per Myocardial Perfusion Study Information Sheet for the test. LM to arrive 15 minutes early, and that it is imperative to arrive on time for appointment to keep from having the test rescheduled. If you need to cancel or reschedule your appointment, please call the office within 24 hours of your appointment. Failure to do so may result in a cancellation of your appointment, and a $50 no show fee. Phone number given for call back for any questions. Hubbard Robinson, RN

## 2016-03-27 ENCOUNTER — Other Ambulatory Visit: Payer: Self-pay | Admitting: Internal Medicine

## 2016-03-31 ENCOUNTER — Ambulatory Visit (HOSPITAL_COMMUNITY)
Admission: RE | Admit: 2016-03-31 | Discharge: 2016-03-31 | Disposition: A | Discharge status: Home / Self Care | Payer: Medicare Other | Source: Ambulatory Visit | Attending: Cardiovascular Disease | Admitting: Cardiovascular Disease

## 2016-03-31 ENCOUNTER — Ambulatory Visit (HOSPITAL_COMMUNITY): Payer: Medicare Other | Attending: Cardiovascular Disease

## 2016-03-31 DIAGNOSIS — Z87891 Personal history of nicotine dependence: Secondary | ICD-10-CM | POA: Diagnosis not present

## 2016-03-31 DIAGNOSIS — I10 Essential (primary) hypertension: Secondary | ICD-10-CM | POA: Insufficient documentation

## 2016-03-31 DIAGNOSIS — I6523 Occlusion and stenosis of bilateral carotid arteries: Secondary | ICD-10-CM | POA: Diagnosis not present

## 2016-03-31 DIAGNOSIS — R9439 Abnormal result of other cardiovascular function study: Secondary | ICD-10-CM | POA: Diagnosis not present

## 2016-03-31 DIAGNOSIS — R079 Chest pain, unspecified: Secondary | ICD-10-CM | POA: Insufficient documentation

## 2016-03-31 DIAGNOSIS — I779 Disorder of arteries and arterioles, unspecified: Secondary | ICD-10-CM | POA: Insufficient documentation

## 2016-03-31 DIAGNOSIS — E785 Hyperlipidemia, unspecified: Secondary | ICD-10-CM | POA: Diagnosis not present

## 2016-03-31 DIAGNOSIS — I739 Peripheral vascular disease, unspecified: Secondary | ICD-10-CM

## 2016-03-31 HISTORY — PX: CARDIOVASCULAR STRESS TEST: SHX262

## 2016-03-31 LAB — MYOCARDIAL PERFUSION IMAGING
CHL CUP RESTING HR STRESS: 76 {beats}/min
CSEPPHR: 96 {beats}/min
LV dias vol: 63 mL (ref 46–106)
LVSYSVOL: 18 mL
NUC STRESS TID: 1.1
RATE: 0.25
SDS: 4
SRS: 11
SSS: 15

## 2016-03-31 MED ORDER — TECHNETIUM TC 99M TETROFOSMIN IV KIT
10.6000 | PACK | Freq: Once | INTRAVENOUS | Status: AC | PRN
  Administered 2016-03-31: 10.6 via INTRAVENOUS
  Filled 2016-03-31: qty 11

## 2016-03-31 MED ORDER — TECHNETIUM TC 99M TETROFOSMIN IV KIT
31.7000 | PACK | Freq: Once | INTRAVENOUS | Status: AC | PRN
  Administered 2016-03-31: 31.7 via INTRAVENOUS
  Filled 2016-03-31: qty 32

## 2016-03-31 MED ORDER — REGADENOSON 0.4 MG/5ML IV SOLN
0.4000 mg | Freq: Once | INTRAVENOUS | Status: AC
  Administered 2016-03-31: 0.4 mg via INTRAVENOUS

## 2016-04-01 ENCOUNTER — Encounter: Payer: Self-pay | Admitting: Physician Assistant

## 2016-04-17 ENCOUNTER — Ambulatory Visit: Payer: Medicare Other | Admitting: Physician Assistant

## 2016-04-28 ENCOUNTER — Other Ambulatory Visit: Payer: Self-pay | Admitting: Emergency Medicine

## 2016-04-28 MED ORDER — ALBUTEROL SULFATE HFA 108 (90 BASE) MCG/ACT IN AERS: 2.0000 | INHALATION_SPRAY | Freq: Four times a day (QID) | RESPIRATORY_TRACT | 2 refills | Status: DC | PRN

## 2016-04-29 ENCOUNTER — Encounter: Payer: Self-pay | Admitting: Internal Medicine

## 2016-04-29 DIAGNOSIS — D126 Benign neoplasm of colon, unspecified: Secondary | ICD-10-CM | POA: Insufficient documentation

## 2016-05-11 ENCOUNTER — Other Ambulatory Visit: Payer: Self-pay | Admitting: Internal Medicine

## 2016-05-21 ENCOUNTER — Ambulatory Visit (INDEPENDENT_AMBULATORY_CARE_PROVIDER_SITE_OTHER): Payer: Medicare Other | Admitting: Pulmonary Disease

## 2016-05-21 ENCOUNTER — Encounter: Payer: Self-pay | Admitting: Pulmonary Disease

## 2016-05-21 DIAGNOSIS — G2581 Restless legs syndrome: Secondary | ICD-10-CM | POA: Diagnosis not present

## 2016-05-21 DIAGNOSIS — F5101 Primary insomnia: Secondary | ICD-10-CM

## 2016-05-21 NOTE — Progress Notes (Signed)
   Subjective:    Patient ID: Erin Roach, female    DOB: 09-Mar-1941, 76 y.o.   MRN: [id number]  HPI   Former [place] pt, follow-up of severe restless leg syndrome. Onset around age 29s after pregnancy She has been seen by neurology. She takes Mirapex, Lyrica, and hydrocodone at bedtime in order to control her symptoms  She has been on Requip, clonazepam, Neupro (site reaction) and Horizant in the past  EMG neg  05/21/2016  Chief Complaint  Patient presents with  . Follow-up    6 month follow up. Pt. states she has been feeling fine since the last visit.     70m FU  She is remarkable benefits with relaxis -mechanical therapy She was able to come off Klonopin, hydrocodone and Neurontin She continues to stay on Mirapex, Lyrica at at 4 PM and then again at bedtime  She complains of insomnia of sleep maintenance. Bedtime is 9 PM and she is able to swallow sleep right away, but she wakes up within 3-4 hours and then stays up and in her den until early morning when she gets out of bed by 5 AM  We discussed various options for insomnia    Review of Systems Patient denies significant dyspnea,cough, hemoptysis,  chest pain, palpitations, pedal edema, orthopnea, paroxysmal nocturnal dyspnea, lightheadedness, nausea, vomiting, abdominal or  leg pains      Objective:   Physical Exam  Gen. Pleasant, well-nourished, in no distress ENT - no lesions, no post nasal drip Neck: No JVD, no thyromegaly, no carotid bruits Lungs: no use of accessory muscles, no dullness to percussion, clear without rales or rhonchi  Cardiovascular: Rhythm regular, heart sounds  normal, no murmurs or gallops, no peripheral edema Musculoskeletal: No deformities, no cyanosis or clubbing        Assessment & Plan:

## 2016-05-21 NOTE — Progress Notes (Deleted)
Subjective:     Patient ID: Erin Roach, female   DOB: 02-24-1941, 76 y.o.   MRN: [id number]  HPI   Review of Systems     Objective:   Physical Exam     Assessment:     ***    Plan:     ***

## 2016-05-21 NOTE — Assessment & Plan Note (Signed)
Continue on Mirapex and Lyrica She had remarkable benefits with relaxis therapy

## 2016-05-21 NOTE — Assessment & Plan Note (Signed)
Rules of sleep hygiene were discussed  - light exercise -avoid caffeinated beverages - no more than 20 mins staying awake in bed, if not asleep, get out of bed & reading or light music - No TV or computer games at bedtime.   We discussed light exposure around 4 PM and early morning -to help. Delay bedtime until 10 PM  Referral to cognitive behavioral therapist for insomnia

## 2016-05-21 NOTE — Patient Instructions (Signed)
Rules of sleep hygiene were discussed  - light exercise -avoid caffeinated beverages - no more than 20 mins staying awake in bed, if not asleep, get out of bed & reading or light music - No TV or computer games at bedtime.   We discussed light exposure around 4 PM and early morning -to help. Delay bedtime until 10 PM  Referral to cognitive behavioral therapist for insomnia

## 2016-06-03 ENCOUNTER — Ambulatory Visit (INDEPENDENT_AMBULATORY_CARE_PROVIDER_SITE_OTHER): Payer: Medicare Other | Admitting: Internal Medicine

## 2016-06-03 ENCOUNTER — Encounter: Payer: Self-pay | Admitting: Internal Medicine

## 2016-06-03 VITALS — BP 120/82 | HR 80 | Temp 97.8°F | Resp 16 | Ht 66.0 in | Wt 204.0 lb

## 2016-06-03 DIAGNOSIS — Z23 Encounter for immunization: Secondary | ICD-10-CM | POA: Diagnosis not present

## 2016-06-03 DIAGNOSIS — E7800 Pure hypercholesterolemia, unspecified: Secondary | ICD-10-CM

## 2016-06-03 DIAGNOSIS — Z0000 Encounter for general adult medical examination without abnormal findings: Secondary | ICD-10-CM | POA: Diagnosis not present

## 2016-06-03 DIAGNOSIS — Z13820 Encounter for screening for osteoporosis: Secondary | ICD-10-CM

## 2016-06-03 DIAGNOSIS — I872 Venous insufficiency (chronic) (peripheral): Secondary | ICD-10-CM

## 2016-06-03 DIAGNOSIS — I10 Essential (primary) hypertension: Secondary | ICD-10-CM

## 2016-06-03 DIAGNOSIS — M85861 Other specified disorders of bone density and structure, right lower leg: Secondary | ICD-10-CM | POA: Diagnosis not present

## 2016-06-03 DIAGNOSIS — R739 Hyperglycemia, unspecified: Secondary | ICD-10-CM

## 2016-06-03 DIAGNOSIS — N3281 Overactive bladder: Secondary | ICD-10-CM

## 2016-06-03 DIAGNOSIS — R32 Unspecified urinary incontinence: Secondary | ICD-10-CM

## 2016-06-03 NOTE — Assessment & Plan Note (Signed)
Taking myrbetriq Will continue

## 2016-06-03 NOTE — Assessment & Plan Note (Signed)
BP well controlled Current regimen effective and well tolerated Continue current medications at current doses cmp  

## 2016-06-03 NOTE — Patient Instructions (Addendum)
Erin Roach , Thank you for taking time to come for your Medicare Wellness Visit. I appreciate your ongoing commitment to your health goals. Please review the following plan we discussed and let me know if I can assist you in the future.   These are the goals we discussed: Goals    None      This is a list of the screening recommended for you and due dates:  Health Maintenance  Topic Date Due  . Tetanus Vaccine  01/07/1962  . DEXA scan (bone density measurement)  11/24/2015  . Mammogram  06/18/2017  . Colon Cancer Screening  04/24/2026  . Flu Shot  Completed  . Shingles Vaccine  Completed  . Pneumonia vaccines  Completed    Test(s) ordered today. Your results will be released to [place] (or called to you) after review, usually within 72hours after test completion. If any changes need to be made, you will be notified at that same time.  All other Health Maintenance issues reviewed.   All recommended immunizations and age-appropriate screenings are up-to-date or discussed.  Tetanus immunization administered today.   Medications reviewed and updated.  No changes recommended at this time.  Your prescription(s) have been submitted to your pharmacy. Please take as directed and contact our office if you believe you are having problem(s) with the medication(s).   Please followup in 6 months   Health Maintenance, Female Introduction Adopting a healthy lifestyle and getting preventive care can go a long way to promote health and wellness. Talk with your health care provider about what schedule of regular examinations is right for you. This is a good chance for you to check in with your provider about disease prevention and staying healthy. In between checkups, there are plenty of things you can do on your own. Experts have done a lot of research about which lifestyle changes and preventive measures are most likely to keep you healthy. Ask your health care provider for more  information. Weight and diet Eat a healthy diet  Be sure to include plenty of vegetables, fruits, low-fat dairy products, and lean protein.  Do not eat a lot of foods high in solid fats, added sugars, or salt.  Get regular exercise. This is one of the most important things you can do for your health.  Most adults should exercise for at least 150 minutes each week. The exercise should increase your heart rate and make you sweat (moderate-intensity exercise).  Most adults should also do strengthening exercises at least twice a week. This is in addition to the moderate-intensity exercise. Maintain a healthy weight  Body mass index (BMI) is a measurement that can be used to identify possible weight problems. It estimates body fat based on height and weight. Your health care provider can help determine your BMI and help you achieve or maintain a healthy weight.  For females 65 years of age and older:  A BMI below 18.5 is considered underweight.  A BMI of 18.5 to 24.9 is normal.  A BMI of 25 to 29.9 is considered overweight.  A BMI of 30 and above is considered obese. Watch levels of cholesterol and blood lipids  You should start having your blood tested for lipids and cholesterol at 74 years of age, then have this test every 5 years.  You may need to have your cholesterol levels checked more often if:  Your lipid or cholesterol levels are high.  You are older than 74 years of age.  You are  at high risk for heart disease. Cancer screening Lung Cancer  Lung cancer screening is recommended for adults 15-46 years old who are at high risk for lung cancer because of a history of smoking.  A yearly low-dose CT scan of the lungs is recommended for people who:  Currently smoke.  Have quit within the past 15 years.  Have at least a 30-pack-year history of smoking. A pack year is smoking an average of one pack of cigarettes a day for 1 year.  Yearly screening should continue until  it has been 15 years since you quit.  Yearly screening should stop if you develop a health problem that would prevent you from having lung cancer treatment. Breast Cancer  Practice breast self-awareness. This means understanding how your breasts normally appear and feel.  It also means doing regular breast self-exams. Let your health care provider know about any changes, no matter how small.  If you are in your 20s or 30s, you should have a clinical breast exam (CBE) by a health care provider every 1-3 years as part of a regular health exam.  If you are 57 or older, have a CBE every year. Also consider having a breast X-ray (mammogram) every year.  If you have a family history of breast cancer, talk to your health care provider about genetic screening.  If you are at high risk for breast cancer, talk to your health care provider about having an MRI and a mammogram every year.  Breast cancer gene (BRCA) assessment is recommended for women who have family members with BRCA-related cancers. BRCA-related cancers include:  Breast.  Ovarian.  Tubal.  Peritoneal cancers.  Results of the assessment will determine the need for genetic counseling and BRCA1 and BRCA2 testing. Cervical Cancer  Your health care provider may recommend that you be screened regularly for cancer of the pelvic organs (ovaries, uterus, and vagina). This screening involves a pelvic examination, including checking for microscopic changes to the surface of your cervix (Pap test). You may be encouraged to have this screening done every 3 years, beginning at age 51.  For women ages 81-65, health care providers may recommend pelvic exams and Pap testing every 3 years, or they may recommend the Pap and pelvic exam, combined with testing for human papilloma virus (HPV), every 5 years. Some types of HPV increase your risk of cervical cancer. Testing for HPV may also be done on women of any age with unclear Pap test  results.  Other health care providers may not recommend any screening for nonpregnant women who are considered low risk for pelvic cancer and who do not have symptoms. Ask your health care provider if a screening pelvic exam is right for you.  If you have had past treatment for cervical cancer or a condition that could lead to cancer, you need Pap tests and screening for cancer for at least 20 years after your treatment. If Pap tests have been discontinued, your risk factors (such as having a new sexual partner) need to be reassessed to determine if screening should resume. Some women have medical problems that increase the chance of getting cervical cancer. In these cases, your health care provider may recommend more frequent screening and Pap tests. Colorectal Cancer  This type of cancer can be detected and often prevented.  Routine colorectal cancer screening usually begins at 74 years of age and continues through 74 years of age.  Your health care provider may recommend screening at an earlier age if  you have risk factors for colon cancer.  Your health care provider may also recommend using home test kits to check for hidden blood in the stool.  A small camera at the end of a tube can be used to examine your colon directly (sigmoidoscopy or colonoscopy). This is done to check for the earliest forms of colorectal cancer.  Routine screening usually begins at age 74.  Direct examination of the colon should be repeated every 5-10 years through 74 years of age. However, you may need to be screened more often if early forms of precancerous polyps or small growths are found. Skin Cancer  Check your skin from head to toe regularly.  Tell your health care provider about any new moles or changes in moles, especially if there is a change in a mole's shape or color.  Also tell your health care provider if you have a mole that is larger than the size of a pencil eraser.  Always use sunscreen.  Apply sunscreen liberally and repeatedly throughout the day.  Protect yourself by wearing long sleeves, pants, a wide-brimmed hat, and sunglasses whenever you are outside. Heart disease, diabetes, and high blood pressure  High blood pressure causes heart disease and increases the risk of stroke. High blood pressure is more likely to develop in:  People who have blood pressure in the high end of the normal range (130-139/85-89 mm Hg).  People who are overweight or obese.  People who are African American.  If you are 21-69 years of age, have your blood pressure checked every 3-5 years. If you are 45 years of age or older, have your blood pressure checked every year. You should have your blood pressure measured twice-once when you are at a hospital or clinic, and once when you are not at a hospital or clinic. Record the average of the two measurements. To check your blood pressure when you are not at a hospital or clinic, you can use:  An automated blood pressure machine at a pharmacy.  A home blood pressure monitor.  If you are between 58 years and 48 years old, ask your health care provider if you should take aspirin to prevent strokes.  Have regular diabetes screenings. This involves taking a blood sample to check your fasting blood sugar level.  If you are at a normal weight and have a low risk for diabetes, have this test once every three years after 74 years of age.  If you are overweight and have a high risk for diabetes, consider being tested at a younger age or more often. Preventing infection Hepatitis B  If you have a higher risk for hepatitis B, you should be screened for this virus. You are considered at high risk for hepatitis B if:  You were born in a country where hepatitis B is common. Ask your health care provider which countries are considered high risk.  Your parents were born in a high-risk country, and you have not been immunized against hepatitis B (hepatitis B  vaccine).  You have HIV or AIDS.  You use needles to inject street drugs.  You live with someone who has hepatitis B.  You have had sex with someone who has hepatitis B.  You get hemodialysis treatment.  You take certain medicines for conditions, including cancer, organ transplantation, and autoimmune conditions. Hepatitis C  Blood testing is recommended for:  Everyone born from [birth year] through 1963.  Anyone with known risk factors for hepatitis C. Sexually transmitted infections (STIs)  You should be screened for sexually transmitted infections (STIs) including gonorrhea and chlamydia if:  You are sexually active and are younger than 74 years of age.  You are older than 74 years of age and your health care provider tells you that you are at risk for this type of infection.  Your sexual activity has changed since you were last screened and you are at an increased risk for chlamydia or gonorrhea. Ask your health care provider if you are at risk.  If you do not have HIV, but are at risk, it may be recommended that you take a prescription medicine daily to prevent HIV infection. This is called pre-exposure prophylaxis (PrEP). You are considered at risk if:  You are sexually active and do not regularly use condoms or know the HIV status of your partner(s).  You take drugs by injection.  You are sexually active with a partner who has HIV. Talk with your health care provider about whether you are at high risk of being infected with HIV. If you choose to begin PrEP, you should first be tested for HIV. You should then be tested every 3 months for as long as you are taking PrEP. Pregnancy  If you are premenopausal and you may become pregnant, ask your health care provider about preconception counseling.  If you may become pregnant, take 400 to 800 micrograms (mcg) of folic acid every day.  If you want to prevent pregnancy, talk to your health care provider about birth control  (contraception). Osteoporosis and menopause  Osteoporosis is a disease in which the bones lose minerals and strength with aging. This can result in serious bone fractures. Your risk for osteoporosis can be identified using a bone density scan.  If you are 44 years of age or older, or if you are at risk for osteoporosis and fractures, ask your health care provider if you should be screened.  Ask your health care provider whether you should take a calcium or vitamin D supplement to lower your risk for osteoporosis.  Menopause may have certain physical symptoms and risks.  Hormone replacement therapy may reduce some of these symptoms and risks. Talk to your health care provider about whether hormone replacement therapy is right for you. Follow these instructions at home:  Schedule regular health, dental, and eye exams.  Stay current with your immunizations.  Do not use any tobacco products including cigarettes, chewing tobacco, or electronic cigarettes.  If you are pregnant, do not drink alcohol.  If you are breastfeeding, limit how much and how often you drink alcohol.  Limit alcohol intake to no more than 1 drink per day for nonpregnant women. One drink equals 12 ounces of beer, 5 ounces of wine, or 1 ounces of hard liquor.  Do not use street drugs.  Do not share needles.  Ask your health care provider for help if you need support or information about quitting drugs.  Tell your health care provider if you often feel depressed.  Tell your health care provider if you have ever been abused or do not feel safe at home. This information is not intended to replace advice given to you by your health care provider. Make sure you discuss any questions you have with your health care provider. Document Released: 11/10/2010 Document Revised: 10/03/2015 Document Reviewed: 01/29/2015  2017 [employer]vier

## 2016-06-03 NOTE — Progress Notes (Signed)
Pre visit review using our clinic review tool, if applicable. No additional management support is needed unless otherwise documented below in the visit note. 

## 2016-06-03 NOTE — Progress Notes (Signed)
Subjective:    Patient ID: Erin Roach, female    DOB: 04-08-1941, 76 y.o.   MRN: [id number]  HPI Here for medicare wellness exam and an annual physical exam.   I have personally reviewed and have noted 1.The patient's medical and social history 2.Their use of alcohol, tobacco or illicit drugs 3.Their current medications and supplements 4.The patient's functional ability including ADL's, fall risks, home safety risks and hearing or visual impairment. 5.Diet and physical activities 6.Evidence for depression or mood disorders 7.Care team reviewed  - Ortho - Dr Cathi Roan - Baldo Ash, Gyn - at [facility]   She is getting ready to have back surgery.  Nothing has worked for her pain and she is having trouble standing and walking for long.  She has pain in her upper legs and some back pain.  She is having surgery 06/17/16.  Dr Cathi Roan in [place] will be doing the surgery.     Are there smokers in your home (other than you)? No  Risk Factors Exercise: exercise bike three times a week, leg exercises, walking minimal due to back/leg pain Dietary issues discussed: well balanced, has decreased carbs, Limits sugars  Cardiac risk factors: advanced age, hypertension, hyperlipidemia, and obesity (BMI >= 30 kg/m2).  Depression Screen  Have you felt down, depressed or hopeless? No  Have you felt little interest or pleasure in doing things?  No  Activities of Daily Living In your present state of health, do you have any difficulty performing the following activities?:  Driving? No Managing money?  No Feeding yourself? No Getting from bed to chair? No Climbing a flight of stairs? No Preparing food and eating?: No Bathing or showering? No Getting dressed: No Getting to/using the toilet? No Moving around from place to place: No In the past year have you fallen or had a near fall?: No   Are you sexually active?  Yes   Do you have  more than one partner?  No   Hearing Difficulties: yes - mild Do you often ask people to speak up or repeat themselves? No Do you experience ringing or noises in your ears? No Do you have difficulty understanding soft or whispered voices? Yes  Vision:              Any change in vision: will have cataract surgery, change in vision             Up to date with eye exam: yes  Memory:  Do you feel that you have a problem with memory? No  Do you often misplace items? No  Do you feel safe at home?  Yes  Cognitive Testing  Alert, Orientated? Yes  Normal Appearance? Yes  Recall of three objects?  Yes  Can perform simple calculations? Yes  Displays appropriate judgment? Yes  Can read the correct time from a watch face? Yes   Advanced Directives have been discussed with the patient? Yes - in place   Medications and allergies reviewed with patient and updated if appropriate.  Patient Active Problem List   Diagnosis Date Noted  . Overactive bladder 06/03/2016  . Tubular adenoma of colon 04/29/2016  . Essential hypertension 09/12/2015  . Allergic rhinitis 07/17/2015  . Insomnia 07/17/2015  . Obese 04/10/2015  . S/P left TKA 04/09/2015  . Hyperglycemia 01/09/2015  . Osteopenia 12/04/2012  . 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 RESTLESS LEG SYNDROME 02/11/2009  . LEG EDEMA, BILATERAL 02/11/2009  . Hyperlipidemia 10/30/2008  . URINARY INCONTINENCE 10/30/2008  .  Skin cancer 10/30/2008  . Asthma 11/21/2007  . VENOUS INSUFFICIENCY, LEGS 11/22/2006  . SNORING 11/22/2006    Current Outpatient Prescriptions on File Prior to Visit  Medication Sig Dispense Refill  . furosemide (LASIX) 20 MG tablet TAKE ONE TABLET BY MOUTH DAILY 30 tablet 5  . levocetirizine (XYZAL) 5 MG tablet TAKE 1 TABLET BY MOUTH EVERY EVENING. 30 tablet 5  . losartan (COZAAR) 100 MG tablet TAKE 1 TABLET BY MOUTH DAILY. 30 tablet 5  . MYRBETRIQ 25 MG TB24 tablet TAKE 2 TABLETS (50 MG TOTAL) BY MOUTH DAILY. 180 tablet 0  .  pramipexole (MIRAPEX) 0.25 MG tablet TAKE 2 TABLETS BY MOUTH IN THE AFTERNOON AND 2 TABLETS AFTER DINNER 120 tablet 5  . pregabalin (LYRICA) 75 MG capsule 75 mg with dinner and 150 mg at bedtime 270 capsule 1  . simvastatin (ZOCOR) 20 MG tablet TAKE 1 TABLET (20 MG TOTAL) BY MOUTH AT BEDTIME. 90 tablet 1  . [DISCONTINUED] VESICARE 10 MG tablet TAKE 1 TABLET EVERY DAY 30 tablet 0   No current facility-administered medications on file prior to visit.     Past Medical History:  Diagnosis Date  . Arthritis   . Asthma   . Carotid artery disease ([place])    a. Carotid duplex 03/2016 - duplex was stable, 123456 RICA, 123456 LICA  . Chest pain    a. normal coronaries 2010.  [name] Esophageal reflux   . Essential hypertension 09/12/2015  . Heart murmur   . Hyperlipidemia   . Hypertension   . Leg edema    bilateral  . LVH (left ventricular hypertrophy)    a.  2D echo 2010: EF 75-80%, hyperdynamic LV with near cavity oblieration, mild LVH, grade 1 DD.  [name] Obesity   . RLS (restless legs syndrome)   . Skin cancer    hx of squamous cell buttocks area, Yevette Edwards MD,Gen Surgery: Amy Martinique MD, derm  . Snoring    no sleep study  . Urinary incontinence   . Venous insufficiency    legs    Past Surgical History:  Procedure Laterality Date  . APPENDECTOMY    . COLONOSCOPY     neg; Dr Collene Mares  . CYSTOSCOPY  2004   ? negative, Dr Jeffie Pollock  . skin cancer removal     squamous cell interglureal space  . TONSILLECTOMY    . 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 Ulnar Nerve Surgery     04/20/13:  pt denies    Social History   Social History  . Marital status: Married    Spouse name: N/A  . Number of children: N/A  . Years of education: N/A   Occupational History  . retired Pharmacist, hospital    Social History Main Topics  . Smoking status: Former Smoker    Packs/day: 0.50     Years: 15.00    Types: Cigarettes  . Smokeless tobacco: Never Used     Comment: smoked age [age], up to 1 ppd  . Alcohol use 1.8 oz/week    3 Glasses of wine per week     Comment: socially  . Drug use: No  . Sexual activity: Not on file   Other Topics Concern  . Not on file   Social History Narrative   2 children ages 69,35   Regular exercise no    Family History  Problem Relation Age of Onset  . Esophageal cancer Father   . Heart disease Father     Unknown what kind  . Hyperlipidemia Mother   . CAD Sister     64 years younger than patient - had stent. Was told that her anorexia/bulimia may have played a role  . Stroke Maternal Grandmother 66  . Diabetes Maternal Grandmother   . Heart attack Maternal Grandfather 93  . COPD Maternal Grandfather   . Heart attack Paternal Grandmother     35s  . Diabetes      MGGM    Review of Systems  Constitutional: Negative for appetite change, fatigue and fever.  HENT: Positive for hearing loss (mild). Negative for tinnitus.   Eyes: Positive for visual disturbance (related to cataracts).  Respiratory: Positive for cough (intermittent - related to asthma). Negative for shortness of breath and wheezing.   Cardiovascular: Positive for leg swelling (chronic, unchanged). Negative for chest pain and palpitations.  Gastrointestinal: Negative for abdominal pain, blood in stool, constipation, diarrhea and nausea.       Gerd occ  Genitourinary: Negative for difficulty urinating, dysuria and hematuria.  Musculoskeletal: Positive for arthralgias and back pain.  Skin: Negative for color change and rash.  Neurological: Negative for dizziness, light-headedness and headaches.  Psychiatric/Behavioral: Negative for dysphoric mood. The patient is not nervous/anxious.        Objective:   Vitals:   06/03/16 0822  BP: 120/82  Pulse: 80  Resp: 16  Temp: 97.8 F (36.6 C)   Filed Weights   06/03/16 0822  Weight: 204 lb (92.5 kg)   Body  mass index is 32.93 kg/m.  Wt Readings from Last 3 Encounters:  06/03/16 204 lb (92.5 kg)  05/21/16 204 lb 9.6 oz (92.8 kg)  03/31/16 206 lb (93.4 kg)     Physical Exam Constitutional: She appears well-developed and well-nourished. No distress.  HENT:  Head: Normocephalic and atraumatic.  Right Ear: External ear normal. Normal ear canal and TM Left Ear: External ear normal.  Normal ear canal and TM Mouth/Throat: Oropharynx is clear and moist.  Eyes: Conjunctivae and EOM are normal.  Neck: Neck supple. No tracheal deviation present. No thyromegaly present.  No carotid bruit  Cardiovascular: Normal rate, regular rhythm and normal heart sounds.   No murmur heard.  B/L LE 1+ non pitting edema. Superficial varicosities in ankles, feet Pulmonary/Chest: Effort normal and breath sounds normal. No respiratory distress. She has no wheezes. She has no rales.  Breast: deferred to Gyn Abdominal: Soft. She exhibits no distension. There is no tenderness.  Lymphadenopathy: She has no cervical adenopathy.  Skin: Skin is warm and dry. She is not diaphoretic.  normal-appearing mole on back  Psychiatric: She has a normal mood and affect. Her behavior is normal.       Assessment & Plan:   Wellness Exam: Immunizations td due, others are up to date - will check w/ insurance regarding coverage for td Colonoscopy  Up to date  Mammogram  Up to date  Dexa  - due - last done ? 2014, has osteopenia She believes she has had a bone density since 2014 -  possibly ordered through [facility] to date  Eye exam  Up to date  Hearing loss  Yes, mild - not ready for hearing aids Memory concerns/difficulties Independent of ADLs -  fully Stressed the importance of regular exercise   Patient received copy of preventative screening tests/immunizations recommended for the next 5-10  years.   Physical exam: Screening blood work ordered Immunizations td due, others are up to date - will check w/ insurance  regarding coverage for td Colonoscopy  Up to date  Mammogram  Up to date  Dexa  - due - last done ? 2014, has osteopenia She believes she has had a bone density since 2014 -  possibly ordered through [facility] exams EKG - last done 2017 Exercise - using the bike, walking limited due to back pain Weight - working on weight loss Skin - 1 normal-appearing mole on back, no other skin concerns Substance abuse - none  See Problem List for Assessment and Plan of chronic medical problems.  Follow-up in 6 months

## 2016-06-03 NOTE — Assessment & Plan Note (Signed)
Chronic lower extremity edema-stable Taking furosemide 20 mg daily CMP

## 2016-06-03 NOTE — Assessment & Plan Note (Signed)
Taking her myrbetriq 25 mg daily-? Helping Can consider increasing dose to see if this is more effective

## 2016-06-03 NOTE — Assessment & Plan Note (Signed)
?   Last DEXA-she will be she has had a bone density since 2014-possibly ordered through GYN She will check and make sure she is up to date Will check vitamin D level She is exercising regularly-as much as tolerated with her chronic back pain

## 2016-06-03 NOTE — Assessment & Plan Note (Signed)
Check a1c 

## 2016-06-03 NOTE — Assessment & Plan Note (Signed)
Check lipid panel  Continue daily statin Regular exercise and healthy diet encouraged  

## 2016-06-04 ENCOUNTER — Other Ambulatory Visit (INDEPENDENT_AMBULATORY_CARE_PROVIDER_SITE_OTHER): Payer: Medicare Other

## 2016-06-04 DIAGNOSIS — R739 Hyperglycemia, unspecified: Secondary | ICD-10-CM | POA: Diagnosis not present

## 2016-06-04 DIAGNOSIS — Z0000 Encounter for general adult medical examination without abnormal findings: Secondary | ICD-10-CM | POA: Diagnosis not present

## 2016-06-04 DIAGNOSIS — M85861 Other specified disorders of bone density and structure, right lower leg: Secondary | ICD-10-CM

## 2016-06-04 LAB — COMPREHENSIVE METABOLIC PANEL
ALBUMIN: 4.2 g/dL (ref 3.5–5.2)
ALK PHOS: 63 U/L (ref 39–117)
ALT: 30 U/L (ref 0–35)
AST: 18 U/L (ref 0–37)
BILIRUBIN TOTAL: 0.5 mg/dL (ref 0.2–1.2)
BUN: 18 mg/dL (ref 6–23)
CHLORIDE: 108 meq/L (ref 96–112)
CO2: 27 mEq/L (ref 19–32)
CREATININE: 0.71 mg/dL (ref 0.40–1.20)
Calcium: 9 mg/dL (ref 8.4–10.5)
GFR: 85.67 mL/min (ref >60.00)
Glucose, Bld: 121 mg/dL — ABNORMAL HIGH (ref 70–99)
Potassium: 4.6 mEq/L (ref 3.5–5.1)
SODIUM: 141 meq/L (ref 135–145)
Total Protein: 7 g/dL (ref 6.0–8.3)

## 2016-06-04 LAB — CBC WITH DIFFERENTIAL/PLATELET
BASOS PCT: 0.9 % (ref 0.0–3.0)
Basophils Absolute: 0.1 10*3/uL (ref 0.0–0.1)
EOS ABS: 0.3 10*3/uL (ref 0.0–0.7)
EOS PCT: 4.1 % (ref 0.0–5.0)
HEMATOCRIT: 37 % (ref 36.0–46.0)
HEMOGLOBIN: 12.7 g/dL (ref 12.0–15.0)
LYMPHS PCT: 25.9 % (ref 12.0–46.0)
Lymphs Abs: 1.6 10*3/uL (ref 0.7–4.0)
MCHC: 34.3 g/dL (ref 30.0–36.0)
MCV: 88.8 fl (ref 78.0–100.0)
Monocytes Absolute: 0.5 10*3/uL (ref 0.1–1.0)
Monocytes Relative: 7.2 % (ref 3.0–12.0)
Neutro Abs: 3.9 10*3/uL (ref 1.4–7.7)
Neutrophils Relative %: 61.9 % (ref 43.0–77.0)
Platelets: 237 10*3/uL (ref 150.0–400.0)
RBC: 4.17 Mil/uL (ref 3.87–5.11)
RDW: 14.1 % (ref 11.5–15.5)
WBC: 6.4 10*3/uL (ref 4.0–10.5)

## 2016-06-04 LAB — LIPID PANEL
CHOLESTEROL: 202 mg/dL — AB (ref 0–200)
HDL: 55.8 mg/dL (ref >39.00)
LDL Cholesterol: 115 mg/dL — ABNORMAL HIGH (ref 0–99)
NONHDL: 145.94
Total CHOL/HDL Ratio: 4
Triglycerides: 157 mg/dL — ABNORMAL HIGH (ref 0.0–149.0)
VLDL: 31.4 mg/dL (ref 0.0–40.0)

## 2016-06-04 LAB — TSH: TSH: 4.22 u[IU]/mL (ref 0.35–4.50)

## 2016-06-04 LAB — VITAMIN D 25 HYDROXY (VIT D DEFICIENCY, FRACTURES): VITD: 25.19 ng/mL — AB (ref 30.00–100.00)

## 2016-06-04 LAB — HEMOGLOBIN A1C: HEMOGLOBIN A1C: 5.3 % (ref 4.6–6.5)

## 2016-06-05 ENCOUNTER — Other Ambulatory Visit: Payer: Self-pay | Admitting: Neurology

## 2016-06-05 ENCOUNTER — Encounter: Payer: Self-pay | Admitting: Internal Medicine

## 2016-06-05 MED ORDER — PREGABALIN 75 MG PO CAPS: ORAL_CAPSULE | ORAL | 1 refills | Status: DC

## 2016-06-05 MED ORDER — SIMVASTATIN 40 MG PO TABS: 40.0000 mg | ORAL_TABLET | Freq: Every day | ORAL | 3 refills | Status: DC

## 2016-06-14 ENCOUNTER — Other Ambulatory Visit: Payer: Self-pay | Admitting: Internal Medicine

## 2016-07-14 LAB — HM DEXA SCAN

## 2016-07-14 LAB — HM MAMMOGRAPHY

## 2016-07-16 ENCOUNTER — Encounter: Payer: Self-pay | Admitting: Internal Medicine

## 2016-07-21 ENCOUNTER — Other Ambulatory Visit: Payer: Self-pay | Admitting: Internal Medicine

## 2016-07-26 ENCOUNTER — Encounter: Payer: Self-pay | Admitting: Internal Medicine

## 2016-07-28 ENCOUNTER — Ambulatory Visit: Payer: Medicare Other | Attending: Orthopaedic Surgery | Admitting: Physical Therapy

## 2016-07-28 ENCOUNTER — Encounter: Payer: Self-pay | Admitting: Physical Therapy

## 2016-07-28 DIAGNOSIS — M62838 Other muscle spasm: Secondary | ICD-10-CM | POA: Diagnosis present

## 2016-07-28 DIAGNOSIS — M6281 Muscle weakness (generalized): Secondary | ICD-10-CM

## 2016-07-28 DIAGNOSIS — M545 Low back pain: Secondary | ICD-10-CM

## 2016-07-28 NOTE — Patient Instructions (Signed)
Piriformis Stretch, Sitting    Sit, one ankle on opposite knee, same-side hand on crossed knee. Push down on knee, keeping spine straight. Lean torso forward, with flat back, until tension is felt in hamstrings and gluteals of crossed-leg side. Hold _30__ seconds.  Repeat _3__ times per session. Do __1_ sessions per day.  Copyright  VHI. All rights reserved.   

## 2016-07-28 NOTE — Telephone Encounter (Signed)
Patient had mammogram and Dexa done at [facility] on 3/6 at 10am.  Would like to know if [place] could get this information b/c it should have already been sent.

## 2016-07-29 ENCOUNTER — Encounter: Payer: Self-pay | Admitting: Internal Medicine

## 2016-07-29 NOTE — Progress Notes (Signed)
Patient needs the results to be interpreted. Mychart message sent Wednesday requesting the results as she did not see it in her my chart portal. Cecille Rubin called [facility] and had them fax the results.

## 2016-07-29 NOTE — Therapy (Signed)
[facility] [address] [place], [place], [address] Phone: [phone]   Fax:  [phone]  Physical Therapy Evaluation  Patient Details  Name: Erin Roach MRN: [id number] Date of Birth: 1940-07-30 Referring Provider: Dewaine Oats, MD  Encounter Date: 07/28/2016      PT End of Session - 07/28/16 1058    Visit Number 1   Number of Visits 10   Date for PT Re-Evaluation 09/22/16   Authorization Type gcodes at 10th visit; KX at 89   PT Start Time 1031   PT Stop Time 1058   PT Time Calculation (min) 27 min   Activity Tolerance Patient tolerated treatment well   Behavior During Therapy [facility] for tasks assessed/performed      Past Medical History:  Diagnosis Date  . Arthritis   . Asthma   . Carotid artery disease ([place])    a. Carotid duplex 03/2016 - duplex was stable, 19-41% RICA, 7-40% LICA  . Chest pain    a. normal coronaries 2010.  [name] Esophageal reflux   . Essential hypertension 09/12/2015  . Heart murmur   . Hyperlipidemia   . Hypertension   . Leg edema    bilateral  . LVH (left ventricular hypertrophy)    a.  2D echo 2010: EF 75-80%, hyperdynamic LV with near cavity oblieration, mild LVH, grade 1 DD.  [name] Obesity   . RLS (restless legs syndrome)   . Skin cancer    hx of squamous cell buttocks area, Yevette Edwards MD,Gen Surgery: Amy Martinique MD, derm  . Snoring    no sleep study  . Urinary incontinence   . Venous insufficiency    legs    Past Surgical History:  Procedure Laterality Date  . APPENDECTOMY    . COLONOSCOPY     neg; Dr Collene Mares  . CYSTOSCOPY  2004   ? negative, Dr Jeffie Pollock  . skin cancer removal     squamous cell interglureal space  . TONSILLECTOMY    . 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 Ulnar Nerve Surgery     04/20/13:  pt denies    There  were no vitals filed for this visit.       Subjective Assessment - 07/28/16 1035    Subjective Has been having less pain down the legs.  Feeling sharp pain in both hips when walking.  Surgery 06/17/16 has reports she has spacer in L4-5.  States pain has been better since surgery but not having hip pain when walking.  She would like to be able to get back to taking long walks   Pertinent History back surgery    Limitations Walking   How long can you walk comfortably? 20-30 minutes with increased pain   Patient Stated Goals be able to take hour walks and be more active   Currently in Pain? Yes   Pain Score 6   only when walking   Pain Location Hip   Pain Orientation Right;Left   Pain Descriptors / Indicators Sharp   Pain Type Acute pain   Pain Onset 1 to 4 weeks ago   Pain Frequency Intermittent   Aggravating Factors  walking   Pain Relieving Factors sitting   Effect of Pain on Daily Activities can't walk as much as she would like  [facility] PT Assessment - 07/29/16 0001      Assessment   Medical Diagnosis M54.5 low back pain   Referring Provider Dewaine Oats, MD   Onset Date/Surgical Date 06/17/16   Prior Therapy no     Precautions   Precautions Back     Restrictions   Weight Bearing Restrictions No     [place] residence     Prior Function   Level of Independence Independent     Cognition   Overall Cognitive Status Within Functional Limits for tasks assessed     Observation/Other Assessments   Focus on Therapeutic Outcomes (FOTO)  48% limited  goal 42% limited     Posture/Postural Control   Posture/Postural Control Postural limitations   Postural Limitations Anterior pelvic tilt;Decreased lumbar lordosis     Strength   Overall Strength Comments core strength 3/5; right LE strength hip flexion and external rotation 4-/5     Flexibility   Soft Tissue Assessment /Muscle Length --  right hamstring and  piriformis tight     Palpation   Palpation comment glutes, piriformis, hip flexor tight bilaterally (left>right)     Ambulation/Gait   Gait Pattern --  decreased hip extension                           PT Education - 07/28/16 1057    Education provided Yes   Education Details piriformis stretch   Person(s) Educated Patient   Methods Explanation;Demonstration;Tactile cues;Verbal cues;Handout   Comprehension Verbalized understanding;Returned demonstration          PT Short Term Goals - 07/29/16 0801      PT SHORT TERM GOAL #1   Title independent with initial HEP   Time 4   Period Weeks   Status New     PT SHORT TERM GOAL #2   Title pt will report pain 4/10 at most due to improved soft tissue length   Baseline 6/10   Time 4   Period Weeks   Status New     PT SHORT TERM GOAL #3   Title pt will be able to tolerate 10 minute walks 3x/day for reintroduction to walking routine   Time 4   Period Weeks   Status New     PT SHORT TERM GOAL #4   Title MMT 4+/5 for bilateral hip flexion and external rotation for improved functional movements   Time 4   Period Weeks   Status New           PT Long Term Goals - 07/29/16 0805      PT LONG TERM GOAL #1   Title independent with advanced HEP   Time 8   Period Weeks   Status New     PT LONG TERM GOAL #2   Title able to take 30 min walk up to 2x/day for return to normal activity   Time 8   Period Weeks   Status New     PT LONG TERM GOAL #3   Title pain reduced by 50% during the most aggravating activity for improved function   Time 8   Period Weeks   Status New     PT LONG TERM GOAL #4   Title FOTO < or = to 42%    Time 8   Period Weeks   Status New     PT LONG TERM GOAL #5   Title core strength improved  in order to demonstrate improved posture during functional sit<>stand and walking with good alignment   Time 8   Period Weeks   Status New               Plan - 07/29/16 0747     Clinical Impression Statement Pt seen for low complexity evaluation today due to stable condition.  Pt has been experiencing 6/10 pain in bilateral hips that is brought on by walking.  Pt typically walks for exercise and she is unable to return to her normal walking program.  Pt demonstrates core weakness with functional movements 3/5 and hip flexion and external rotation weakness of 4-/5.  Pt demonstrates hip tightness right > left piriformis, hamstring and hip flexors.  Patient demonstrates increased flexed posture in standing and walking. She has reduced endurance with activities.  Skilled PT is needed to address these impairments and return to activities and exercise routine as part of healthy lifestyle.   Rehab Potential Excellent   Clinical Impairments Affecting Rehab Potential history of back surgery and chronic pain   PT Frequency 2x / week   PT Duration 8 weeks   PT Treatment/Interventions ADLs/Self Care Home Management;Biofeedback;Cryotherapy;Moist Heat;Gait training;Stair training;Functional mobility training;Electrical Stimulation;Therapeutic activities;Therapeutic exercise;Balance training;Neuromuscular re-education;Patient/family education;Manual techniques;Dry needling;Taping   PT Next Visit Plan manual techniques, piriformis stretch, hip flexor, nustep, core and LE strengthening   Recommended Other Services none   Consulted and Agree with Plan of Care Patient      Patient will benefit from skilled therapeutic intervention in order to improve the following deficits and impairments:  Abnormal gait, Decreased coordination, Decreased endurance, Decreased range of motion, Difficulty walking, Decreased strength, Pain, Impaired flexibility, Postural dysfunction  Visit Diagnosis: Acute bilateral low back pain, with sciatica presence unspecified  Other muscle spasm  Muscle weakness (generalized)      G-Codes - 2016-08-14 1354    Functional Assessment Tool Used (Outpatient Only) FOTO  and clinical reasoning   Functional Limitation Mobility: Walking and moving around   Mobility: Walking and Moving Around Current Status [phone]) At least 40 percent but less than 60 percent impaired, limited or restricted   Mobility: Walking and Moving Around Goal Status [phone]) At least 40 percent but less than 60 percent impaired, limited or restricted       Problem List Patient Active Problem List   Diagnosis Date Noted  . Tubular adenoma of colon 04/29/2016  . Essential hypertension 09/12/2015  . Allergic rhinitis 07/17/2015  . Insomnia 07/17/2015  . Obese 04/10/2015  . S/P left TKA 04/09/2015  . Hyperglycemia 01/09/2015  . Osteopenia 12/04/2012  . 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 RESTLESS LEG SYNDROME 02/11/2009  . LEG EDEMA, BILATERAL 02/11/2009  . Hyperlipidemia 10/30/2008  . Urinary incontinence 10/30/2008  . Skin cancer 10/30/2008  . Asthma 11/21/2007  . Venous (peripheral) insufficiency 11/22/2006  . SNORING 11/22/2006    Zannie Cove , PT 07/29/2016, 10:40 AM  [facility] [address], [place], [place], [address] Phone: [phone]   Fax:  [phone]  Name: EVANGELINE UTLEY MRN: [id number] Date of Birth: Jul 14, 1940

## 2016-07-30 ENCOUNTER — Ambulatory Visit: Payer: Medicare Other | Admitting: Physical Therapy

## 2016-07-30 ENCOUNTER — Encounter: Payer: Self-pay | Admitting: Physical Therapy

## 2016-07-30 DIAGNOSIS — M545 Low back pain: Secondary | ICD-10-CM

## 2016-07-30 DIAGNOSIS — M62838 Other muscle spasm: Secondary | ICD-10-CM

## 2016-07-30 DIAGNOSIS — M6281 Muscle weakness (generalized): Secondary | ICD-10-CM

## 2016-07-30 NOTE — Therapy (Signed)
[facility] [address], [place], [place], [address] Phone: [phone]   Fax:  [phone]  Physical Therapy Treatment  Patient Details  Name: Erin Roach MRN: [id number] Date of Birth: 1940-07-27 Referring Provider: Dewaine Oats, MD  Encounter Date: 07/30/2016      PT End of Session - 07/30/16 0849    Visit Number 2   Number of Visits 10   Date for PT Re-Evaluation 09/22/16   Authorization Type gcodes at 10th visit; KX at 75   PT Start Time 0848   PT Stop Time 0926   PT Time Calculation (min) 38 min   Activity Tolerance Patient tolerated treatment well   Behavior During Therapy [facility] for tasks assessed/performed      Past Medical History:  Diagnosis Date  . Arthritis   . Asthma   . Carotid artery disease ([place])    a. Carotid duplex 03/2016 - duplex was stable, 63-33% RICA, 5-45% LICA  . Chest pain    a. normal coronaries 2010.  [name] Esophageal reflux   . Essential hypertension 09/12/2015  . Heart murmur   . Hyperlipidemia   . Hypertension   . Leg edema    bilateral  . LVH (left ventricular hypertrophy)    a.  2D echo 2010: EF 75-80%, hyperdynamic LV with near cavity oblieration, mild LVH, grade 1 DD.  [name] Obesity   . RLS (restless legs syndrome)   . Skin cancer    hx of squamous cell buttocks area, Yevette Edwards MD,Gen Surgery: Amy Martinique MD, derm  . Snoring    no sleep study  . Urinary incontinence   . Venous insufficiency    legs    Past Surgical History:  Procedure Laterality Date  . APPENDECTOMY    . COLONOSCOPY     neg; Dr Collene Mares  . CYSTOSCOPY  2004   ? negative, Dr Jeffie Pollock  . skin cancer removal     squamous cell interglureal space  . TONSILLECTOMY    . 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 Ulnar Nerve Surgery     04/20/13:  pt denies    There  were no vitals filed for this visit.      Subjective Assessment - 07/30/16 0850    Subjective Left knee that had been replaced is hurting.  Feels like the right leg is shorter when walking and gait doesn't feel smooth.   Pertinent History back surgery    Limitations Walking   How long can you walk comfortably? 20-30 minutes with increased pain (7 minutes is start of pain)   Patient Stated Goals be able to take hour walks and be more active   Currently in Pain? Yes   Pain Score 4    Pain Location Hip   Pain Orientation Right;Left   Pain Descriptors / Indicators Sharp   Pain Type Acute pain   Pain Frequency Intermittent   Aggravating Factors  walking   Pain Relieving Factors sitting            [facility] PT Assessment - 07/30/16 0001      Observation/Other Assessments   Observations leg length right LE 84.5cm; left LE 82cm                     [facility] Adult PT Treatment/Exercise - 07/30/16 0001  Lumbar Exercises: Standing   Row Strengthening;Power tower;Both;20 reps  15#   Shoulder Extension Strengthening;Power Tower;Both;20 reps  15#     Lumbar Exercises: Supine   Ab Set 20 reps  with UE extension red band   Clam 20 reps   Large Ball Abdominal Isometric 20 reps;3 seconds     Lumbar Exercises: Sidelying   Clam 20 reps     Knee/Hip Exercises: Aerobic   Nustep L1 x 8 min      Knee/Hip Exercises: Standing   Hip Flexion 10 reps   Hip Abduction 10 reps   Hip Extension 10 reps                  PT Short Term Goals - 07/30/16 0913      PT SHORT TERM GOAL #1   Title independent with initial HEP   Time 4   Period Weeks   Status On-going     PT SHORT TERM GOAL #2   Title pt will report pain 4/10 at most due to improved soft tissue length   Time 4   Period Weeks   Status On-going     PT SHORT TERM GOAL #3   Title pt will be able to tolerate 10 minute walks 3x/day for reintroduction to walking routine   Time 4   Period Weeks   Status  On-going     PT SHORT TERM GOAL #4   Title MMT 4+/5 for bilateral hip flexion and external rotation for improved functional movements   Time 4   Period Weeks   Status On-going           PT Long Term Goals - 07/29/16 0805      PT LONG TERM GOAL #1   Title independent with advanced HEP   Time 8   Period Weeks   Status New     PT LONG TERM GOAL #2   Title able to take 30 min walk up to 2x/day for return to normal activity   Time 8   Period Weeks   Status New     PT LONG TERM GOAL #3   Title pain reduced by 50% during the most aggravating activity for improved function   Time 8   Period Weeks   Status New     PT LONG TERM GOAL #4   Title FOTO < or = to 42%    Time 8   Period Weeks   Status New     PT LONG TERM GOAL #5   Title core strength improved in order to demonstrate improved posture during functional sit<>stand and walking with good alignment   Time 8   Period Weeks   Status New               Plan - 07/30/16 0909    Clinical Impression Statement Patient has 2.5 cm leg length difference in right leg > left leg.  Pt was educated in getting a heel lift to correct.  Pt had difficulty engaging abdominals with standing exercises and was able to perform more successfully in supine.  Pt educated in doing exercises in supine and incorporating exhale for improved muscle coordination with breathing.  Continues to need skilled PT for improved muscle coordination and body mechanics so she can return to full function and exercise routine.   Rehab Potential Excellent   Clinical Impairments Affecting Rehab Potential history of back surgery and chronic pain   PT Treatment/Interventions ADLs/Self Care Home Management;Biofeedback;Cryotherapy;Moist Heat;Gait training;Stair training;Functional  mobility training;Electrical Stimulation;Therapeutic activities;Therapeutic exercise;Balance training;Neuromuscular re-education;Patient/family education;Manual techniques;Dry  needling;Taping   PT Next Visit Plan manual techniques, piriformis stretch, hip flexor, nustep, core and LE strengthening   Consulted and Agree with Plan of Care Patient      Patient will benefit from skilled therapeutic intervention in order to improve the following deficits and impairments:  Abnormal gait, Decreased coordination, Decreased endurance, Decreased range of motion, Difficulty walking, Decreased strength, Pain, Impaired flexibility, Postural dysfunction  Visit Diagnosis: Acute bilateral low back pain, with sciatica presence unspecified  Other muscle spasm  Muscle weakness (generalized)     Problem List Patient Active Problem List   Diagnosis Date Noted  . Tubular adenoma of colon 04/29/2016  . Essential hypertension 09/12/2015  . Allergic rhinitis 07/17/2015  . Insomnia 07/17/2015  . Obese 04/10/2015  . S/P left TKA 04/09/2015  . Hyperglycemia 01/09/2015  . Osteopenia 12/04/2012  . 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 RESTLESS LEG SYNDROME 02/11/2009  . LEG EDEMA, BILATERAL 02/11/2009  . Hyperlipidemia 10/30/2008  . Urinary incontinence 10/30/2008  . Skin cancer 10/30/2008  . Asthma 11/21/2007  . Venous (peripheral) insufficiency 11/22/2006  . SNORING 11/22/2006    Zannie Cove, PT 07/30/2016, 9:43 AM  [facility] [address] [place], [place], [address] Phone: [phone]   Fax:  [phone]  Name: JALEEAH SLIGHT MRN: [id number] Date of Birth: [birth year]

## 2016-07-31 ENCOUNTER — Encounter: Payer: Self-pay | Admitting: Internal Medicine

## 2016-08-05 ENCOUNTER — Ambulatory Visit: Payer: Medicare Other | Admitting: Physical Therapy

## 2016-08-05 DIAGNOSIS — M545 Low back pain: Secondary | ICD-10-CM

## 2016-08-05 DIAGNOSIS — M6281 Muscle weakness (generalized): Secondary | ICD-10-CM

## 2016-08-05 DIAGNOSIS — M62838 Other muscle spasm: Secondary | ICD-10-CM

## 2016-08-05 NOTE — Therapy (Signed)
[facility] [address] [place], [place], [address] Phone: [phone]   Fax:  [phone]  Physical Therapy Treatment  Patient Details  Name: Erin Roach MRN: [id number] Date of Birth: [birth year] Referring Provider: Dewaine Oats, MD  Encounter Date: 08/05/2016      PT End of Session - 08/05/16 1211    Visit Number 3   Number of Visits 10   Date for PT Re-Evaluation 09/22/16   Authorization Type gcodes at 10th visit; KX at 56   PT Start Time 1152   PT Stop Time 1231   PT Time Calculation (min) 39 min   Activity Tolerance Patient tolerated treatment well   Behavior During Therapy [facility] for tasks assessed/performed      Past Medical History:  Diagnosis Date  . Arthritis   . Asthma   . Carotid artery disease ([place])    a. Carotid duplex 03/2016 - duplex was stable, 69-62% RICA, 9-52% LICA  . Chest pain    a. normal coronaries 2010.  [name] Esophageal reflux   . Essential hypertension 09/12/2015  . Heart murmur   . Hyperlipidemia   . Hypertension   . Leg edema    bilateral  . LVH (left ventricular hypertrophy)    a.  2D echo 2010: EF 75-80%, hyperdynamic LV with near cavity oblieration, mild LVH, grade 1 DD.  [name] Obesity   . RLS (restless legs syndrome)   . Skin cancer    hx of squamous cell buttocks area, Yevette Edwards MD,Gen Surgery: Amy Martinique MD, derm  . Snoring    no sleep study  . Urinary incontinence   . Venous insufficiency    legs    Past Surgical History:  Procedure Laterality Date  . APPENDECTOMY    . COLONOSCOPY     neg; Dr Collene Mares  . CYSTOSCOPY  2004   ? negative, Dr Jeffie Pollock  . skin cancer removal     squamous cell interglureal space  . TONSILLECTOMY    . 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 Ulnar Nerve Surgery     04/20/13:  pt denies    There  were no vitals filed for this visit.      Subjective Assessment - 08/05/16 1156    Subjective Heel lift seems to be helping.   Currently in Pain? Yes   Pain Score 2    Pain Location Hip   Pain Orientation Right;Left   Pain Descriptors / Indicators Aching   Pain Type Acute pain   Pain Onset 1 to 4 weeks ago   Pain Frequency Intermittent   Aggravating Factors  walking and standing   Pain Relieving Factors sitting   Effect of Pain on Daily Activities can't walk and increased pain   Multiple Pain Sites No                         [facility] Adult PT Treatment/Exercise - 08/05/16 0001      Neuro Re-ed    Neuro Re-ed Details  abdomial bracing cues throughout treatment     Lumbar Exercises: Standing   Row Strengthening;Both;20 reps;Theraband  red   Shoulder Extension Strengthening;Both;20 reps;Theraband   Other Standing Lumbar Exercises isometric trunk rotation red band - side stepping 15x each side   Other Standing Lumbar Exercises hip flexor stretch 3x20sec each  side     Lumbar Exercises: Supine   Clam 15 reps  red band 15x each side   Large Ball Abdominal Isometric 20 reps;3 seconds  press down, and ball overhead     Manual Therapy   Manual Therapy Soft tissue mobilization   Manual therapy comments prone with pillow under hips   Soft tissue mobilization bilateral glutes                  PT Short Term Goals - 08/05/16 1200      PT SHORT TERM GOAL #1   Title independent with initial HEP   Time 4   Period Weeks   Status Achieved     PT SHORT TERM GOAL #2   Title pt will report pain 4/10 at most due to improved soft tissue length   Baseline flare up when she was standing all day at a funeral   Time 4   Period Weeks   Status On-going     PT SHORT TERM GOAL #3   Title pt will be able to tolerate 10 minute walks 3x/day for reintroduction to walking routine   Baseline walks 30-45 minutes   Time 4   Period Weeks   Status Achieved            PT Long Term Goals - 08/05/16 1210      PT LONG TERM GOAL #1   Title independent with advanced HEP   Time 8   Period Weeks   Status On-going     PT LONG TERM GOAL #2   Title able to take 30 min walk up to 2x/day for return to normal activity   Time 8   Period Weeks   Status On-going     PT LONG TERM GOAL #3   Title pain reduced by 50% during the most aggravating activity for improved function   Time 8   Period Weeks   Status On-going     PT LONG TERM GOAL #4   Title FOTO < or = to 42%    Time 8   Period Weeks   Status On-going             Patient will benefit from skilled therapeutic intervention in order to improve the following deficits and impairments:  Abnormal gait, Decreased coordination, Decreased endurance, Decreased range of motion, Difficulty walking, Decreased strength, Pain, Impaired flexibility, Postural dysfunction  Visit Diagnosis: Acute bilateral low back pain, with sciatica presence unspecified  Other muscle spasm  Muscle weakness (generalized)     Problem List Patient Active Problem List   Diagnosis Date Noted  . Tubular adenoma of colon 04/29/2016  . Essential hypertension 09/12/2015  . Allergic rhinitis 07/17/2015  . Insomnia 07/17/2015  . Obese 04/10/2015  . S/P left TKA 04/09/2015  . Hyperglycemia 01/09/2015  . Osteopenia 12/04/2012  . 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 RESTLESS LEG SYNDROME 02/11/2009  . LEG EDEMA, BILATERAL 02/11/2009  . Hyperlipidemia 10/30/2008  . Urinary incontinence 10/30/2008  . Skin cancer 10/30/2008  . Asthma 11/21/2007  . Venous (peripheral) insufficiency 11/22/2006  . SNORING 11/22/2006    Zannie Cove, PT 08/05/2016, 2:04 PM  [facility] [address] [place], [place], [address] Phone: [phone]   Fax:  [phone]  Name: Erin Roach MRN: [id number] Date of Birth: 06/20/1940

## 2016-08-09 ENCOUNTER — Other Ambulatory Visit: Payer: Self-pay | Admitting: Internal Medicine

## 2016-08-19 ENCOUNTER — Ambulatory Visit: Payer: Medicare Other | Attending: Orthopaedic Surgery | Admitting: Physical Therapy

## 2016-08-19 ENCOUNTER — Telehealth: Payer: Self-pay | Admitting: Physical Therapy

## 2016-08-19 DIAGNOSIS — M6281 Muscle weakness (generalized): Secondary | ICD-10-CM | POA: Insufficient documentation

## 2016-08-19 DIAGNOSIS — M545 Low back pain: Secondary | ICD-10-CM | POA: Insufficient documentation

## 2016-08-19 DIAGNOSIS — M62838 Other muscle spasm: Secondary | ICD-10-CM | POA: Insufficient documentation

## 2016-08-19 NOTE — Telephone Encounter (Signed)
Called patient due to no show.  Left message  Zannie Cove, PT 08/19/16 12:12 PM

## 2016-08-21 ENCOUNTER — Ambulatory Visit: Payer: Medicare Other | Admitting: Physical Therapy

## 2016-08-21 DIAGNOSIS — M62838 Other muscle spasm: Secondary | ICD-10-CM | POA: Diagnosis present

## 2016-08-21 DIAGNOSIS — M545 Low back pain: Secondary | ICD-10-CM | POA: Diagnosis not present

## 2016-08-21 DIAGNOSIS — M6281 Muscle weakness (generalized): Secondary | ICD-10-CM | POA: Diagnosis present

## 2016-08-21 NOTE — Therapy (Signed)
[facility] [address] [place], [place], [address] Phone: [phone]   Fax:  [phone]  Physical Therapy Treatment  Patient Details  Name: Erin Roach MRN: [id number] Date of Birth: 1941-04-29 Referring Provider: Dewaine Oats, MD  Encounter Date: 08/21/2016      PT End of Session - 08/21/16 1106    Visit Number 4   Date for PT Re-Evaluation 09/22/16   Authorization Type gcodes at 10th visit; KX at 15   PT Start Time 1100   PT Stop Time 1141   PT Time Calculation (min) 41 min   Activity Tolerance Patient tolerated treatment well   Behavior During Therapy [facility] for tasks assessed/performed      Past Medical History:  Diagnosis Date  . Arthritis   . Asthma   . Carotid artery disease ([place])    a. Carotid duplex 03/2016 - duplex was stable, 17-51% RICA, 0-25% LICA  . Chest pain    a. normal coronaries 2010.  [name] Esophageal reflux   . Essential hypertension 09/12/2015  . Heart murmur   . Hyperlipidemia   . Hypertension   . Leg edema    bilateral  . LVH (left ventricular hypertrophy)    a.  2D echo 2010: EF 75-80%, hyperdynamic LV with near cavity oblieration, mild LVH, grade 1 DD.  [name] Obesity   . RLS (restless legs syndrome)   . Skin cancer    hx of squamous cell buttocks area, Yevette Edwards MD,Gen Surgery: Amy Martinique MD, derm  . Snoring    no sleep study  . Urinary incontinence   . Venous insufficiency    legs    Past Surgical History:  Procedure Laterality Date  . APPENDECTOMY    . COLONOSCOPY     neg; Dr Collene Mares  . CYSTOSCOPY  2004   ? negative, Dr Jeffie Pollock  . skin cancer removal     squamous cell interglureal space  . TONSILLECTOMY    . 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 Ulnar Nerve Surgery     04/20/13:  pt denies    There were no vitals filed for  this visit.      Subjective Assessment - 08/21/16 1103    Subjective Pt was [place] vacation at the beach.  Did some walking and still not able to walk and stand as much as she would like to.  Did a lot of hedging in the yard the other day.  I noticed my left knee has been a little more sore lately also.  I had surgery on that knee.   Pertinent History back surgery    Limitations Walking   How long can you walk comfortably? 20-30 minutes with increased pain (7 minutes is start of pain)   Patient Stated Goals be able to take hour walks and be more active   Currently in Pain? Yes   Pain Score 4    Pain Location Back   Pain Orientation Right;Left   Pain Descriptors / Indicators Aching;Sore   Pain Type Chronic pain   Pain Radiating Towards into the legs when walking   Pain Onset More than a month ago   Pain Frequency Intermittent   Multiple Pain Sites No  [place] Adult PT Treatment/Exercise - 08/21/16 0001      Lumbar Exercises: Stretches   Double Knee to Chest Stretch 3 reps;20 seconds   Piriformis Stretch 3 reps;20 seconds     Lumbar Exercises: Supine   Other Supine Lumbar Exercises dying bug and lying on foam roll UE and LE alternating 20x each   Other Supine Lumbar Exercises abdominal bracing with hip abduction in hook lying -20x     Knee/Hip Exercises: Aerobic   Nustep L2 x 10 min      Knee/Hip Exercises: Standing   Hip ADduction 20 reps;Strengthening;Both  red band     Knee/Hip Exercises: Seated   Other Seated Knee/Hip Exercises internal and external hip rotation with red band - 20x each      Knee/Hip Exercises: Supine   Other Supine Knee/Hip Exercises hip 4 ways - 15 x each way                PT Education - 08/21/16 1141    Education provided Yes   Education Details hip 4 ways   Person(s) Educated Patient   Methods Explanation;Demonstration;Tactile cues;Verbal cues;Handout   Comprehension Verbalized understanding;Returned  demonstration          PT Short Term Goals - 08/21/16 1151      PT SHORT TERM GOAL #2   Title pt will report pain 4/10 at most due to improved soft tissue length   Time 4   Period Weeks   Status On-going           PT Long Term Goals - 08/21/16 1151      PT LONG TERM GOAL #1   Title independent with advanced HEP   Time 8   Period Weeks   Status On-going     PT LONG TERM GOAL #2   Title able to take 30 min walk up to 2x/day for return to normal activity   Time 8   Period Weeks   Status On-going     PT LONG TERM GOAL #4   Title FOTO < or = to 42%    Time 8   Period Weeks   Status On-going     PT LONG TERM GOAL #5   Title core strength improved in order to demonstrate improved posture during functional sit<>stand and walking with good alignment   Time 8   Period Weeks   Status On-going               Plan - 08/21/16 1107    Clinical Impression Statement Patient demonstrates good ability to brace abdominals, but needs cues to activate glutes for stability.  Pt has weakness and fatigues quickly with hip adduction and rotation exericses.  Pt continues to need skilled PT for increased strength and stabilitiy for activation of muscle during walking and funcitonal acitviies.   Rehab Potential Excellent   Clinical Impairments Affecting Rehab Potential history of back surgery and chronic pain   PT Treatment/Interventions ADLs/Self Care Home Management;Biofeedback;Cryotherapy;Moist Heat;Gait training;Stair training;Functional mobility training;Electrical Stimulation;Therapeutic activities;Therapeutic exercise;Balance training;Neuromuscular re-education;Patient/family education;Manual techniques;Dry needling;Taping   PT Next Visit Plan core and hip strengthening, review hip series on mat and standing; add gentle rotation to lumbar stretches; manual as needed   Consulted and Agree with Plan of Care Patient      Patient will benefit from skilled therapeutic intervention  in order to improve the following deficits and impairments:  Abnormal gait, Decreased coordination, Decreased endurance, Decreased range of motion, Difficulty walking, Decreased strength, Pain, Impaired flexibility,  Postural dysfunction  Visit Diagnosis: Acute bilateral low back pain, with sciatica presence unspecified  Other muscle spasm  Muscle weakness (generalized)     Problem List Patient Active Problem List   Diagnosis Date Noted  . Tubular adenoma of colon 04/29/2016  . Essential hypertension 09/12/2015  . Allergic rhinitis 07/17/2015  . Insomnia 07/17/2015  . Obese 04/10/2015  . S/P left TKA 04/09/2015  . Hyperglycemia 01/09/2015  . Osteopenia 12/04/2012  . 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 RESTLESS LEG SYNDROME 02/11/2009  . LEG EDEMA, BILATERAL 02/11/2009  . Hyperlipidemia 10/30/2008  . Urinary incontinence 10/30/2008  . Skin cancer 10/30/2008  . Asthma 11/21/2007  . Venous (peripheral) insufficiency 11/22/2006  . SNORING 11/22/2006    Zannie Cove, PT 08/21/2016, 11:57 AM  [facility] [address] [place], [place], [address] Phone: [phone]   Fax:  [phone]  Name: Erin Roach MRN: [id number] Date of Birth: 08-02-1940

## 2016-08-21 NOTE — Patient Instructions (Signed)
        Do all of the above exercises 15x and building up to 20x on each leg   [facility] [address] [place], [place] [address] Phone # [phone] Fax [phone]

## 2016-08-25 ENCOUNTER — Encounter: Payer: Self-pay | Admitting: Physical Therapy

## 2016-08-25 ENCOUNTER — Ambulatory Visit: Payer: Medicare Other | Admitting: Physical Therapy

## 2016-08-25 DIAGNOSIS — M545 Low back pain: Secondary | ICD-10-CM

## 2016-08-25 DIAGNOSIS — M62838 Other muscle spasm: Secondary | ICD-10-CM

## 2016-08-25 DIAGNOSIS — M6281 Muscle weakness (generalized): Secondary | ICD-10-CM

## 2016-08-25 NOTE — Therapy (Signed)
[facility] [address], [place], [place], [address] Phone: [phone]   Fax:  [phone]  Physical Therapy Treatment  Patient Details  Name: Erin Roach MRN: [id number] Date of Birth: Mar 08, 1941 Referring Provider: Dewaine Oats, MD  Encounter Date: 08/25/2016      PT End of Session - 08/25/16 1236    Visit Number 5   Number of Visits 10   Date for PT Re-Evaluation 09/22/16   Authorization Type gcodes at 10th visit; KX at 34   PT Start Time 1230   PT Stop Time 1309   PT Time Calculation (min) 39 min   Activity Tolerance Patient tolerated treatment well   Behavior During Therapy [facility] for tasks assessed/performed      Past Medical History:  Diagnosis Date  . Arthritis   . Asthma   . Carotid artery disease ([place])    a. Carotid duplex 03/2016 - duplex was stable, 46-56% RICA, 8-12% LICA  . Chest pain    a. normal coronaries 2010.  [name] Esophageal reflux   . Essential hypertension 09/12/2015  . Heart murmur   . Hyperlipidemia   . Hypertension   . Leg edema    bilateral  . LVH (left ventricular hypertrophy)    a.  2D echo 2010: EF 75-80%, hyperdynamic LV with near cavity oblieration, mild LVH, grade 1 DD.  [name] Obesity   . RLS (restless legs syndrome)   . Skin cancer    hx of squamous cell buttocks area, Yevette Edwards MD,Gen Surgery: Amy Martinique MD, derm  . Snoring    no sleep study  . Urinary incontinence   . Venous insufficiency    legs    Past Surgical History:  Procedure Laterality Date  . APPENDECTOMY    . COLONOSCOPY     neg; Dr Collene Mares  . CYSTOSCOPY  2004   ? negative, Dr Jeffie Pollock  . skin cancer removal     squamous cell interglureal space  . TONSILLECTOMY    . 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 Ulnar Nerve Surgery     04/20/13:  pt denies    There  were no vitals filed for this visit.      Subjective Assessment - 08/25/16 1233    Subjective Pt reports knee hurting badly for several days after lest session limping on Lt knee. Pt had previous knee surgery so exercises could have irritated it. Pt reports back is doing pretty good today.    Pertinent History back surgery    Limitations Walking   How long can you walk comfortably? 20-30 minutes with increased pain (7 minutes is start of pain)   Patient Stated Goals be able to take hour walks and be more active   Currently in Pain? Yes   Pain Score 4    Pain Location Back   Pain Orientation Right;Left   Pain Descriptors / Indicators Aching;Sore   Pain Type Chronic pain   Pain Radiating Towards into the legs when walking   Pain Onset More than a month ago   Pain Frequency Intermittent   Aggravating Factors  walking and standing   Pain Relieving Factors sitting   Effect of Pain on Daily Activities can't walk and increase pain   Multiple Pain Sites No  [facility] Adult PT Treatment/Exercise - 08/25/16 0001      Neuro Re-ed    Neuro Re-ed Details  Activation of abdominals with activities; activation of glute and hip internal rotators     Lumbar Exercises: Stretches   ITB Stretch 2 reps;10 seconds   Piriformis Stretch 3 reps;20 seconds     Lumbar Exercises: Supine   Glut Set 20 reps  With ball squeeze   Clam 20 reps  red band   Bridge 20 reps  With ball between knees   Large Ball Abdominal Isometric 20 reps;3 seconds  press down, and ball overhead     Lumbar Exercises: Sidelying   Clam 20 reps     Knee/Hip Exercises: Stretches   Active Hamstring Stretch Both;2 reps;10 seconds   Gastroc Stretch Both;2 reps;10 seconds     Knee/Hip Exercises: Aerobic   Nustep L2 x 10 min      Knee/Hip Exercises: Standing   Hip Flexion Stengthening;Both;2 sets;10 reps   Hip Abduction Stengthening;Both;20 reps   Hip Extension Stengthening;Both;2  sets;10 reps     Knee/Hip Exercises: Supine   Straight Leg Raises Strengthening;Both;2 sets;10 reps                PT Education - 08/25/16 1254    Education provided Yes   Education Details modified hip strengthening in supine   Person(s) Educated Patient   Methods Explanation;Demonstration;Handout   Comprehension Verbalized understanding          PT Short Term Goals - 08/25/16 1237      PT SHORT TERM GOAL #2   Title pt will report pain 4/10 at most due to improved soft tissue length   Time 4   Period Weeks   Status On-going     PT SHORT TERM GOAL #3   Title pt will be able to tolerate 10 minute walks 3x/day for reintroduction to walking routine   Time 4   Period Weeks   Status Achieved     PT SHORT TERM GOAL #4   Title MMT 4+/5 for bilateral hip flexion and external rotation for improved functional movements   Time 4   Period Weeks   Status On-going           PT Long Term Goals - 08/25/16 1237      PT LONG TERM GOAL #1   Title independent with advanced HEP   Time 8   Period Weeks   Status On-going               Plan - 08/25/16 1314    Clinical Impression Statement Pt reports back doing well but had some trouble in knee and hip after last session. Reviewed HEP and discussed modified strenghtening exercises to decrease pain in hip and knee. Pt able to toelrate all exercises well. Had some increase in hip tightness after exercises but responded well to hip stretching. Pt will continue to benefit from skilled therapy for core strength and stability.    Rehab Potential Excellent   Clinical Impairments Affecting Rehab Potential history of back surgery and chronic pain   PT Frequency 2x / week   PT Duration 8 weeks   PT Treatment/Interventions ADLs/Self Care Home Management;Biofeedback;Cryotherapy;Moist Heat;Gait training;Stair training;Functional mobility training;Electrical Stimulation;Therapeutic activities;Therapeutic exercise;Balance  training;Neuromuscular re-education;Patient/family education;Manual techniques;Dry needling;Taping   PT Next Visit Plan supine core and hip strenghtening, standing exercises as tolerated, flexibility to decrease tightness   Consulted and Agree with Plan of Care Patient      Patient will  benefit from skilled therapeutic intervention in order to improve the following deficits and impairments:  Abnormal gait, Decreased coordination, Decreased endurance, Decreased range of motion, Difficulty walking, Decreased strength, Pain, Impaired flexibility, Postural dysfunction  Visit Diagnosis: Acute bilateral low back pain, with sciatica presence unspecified  Other muscle spasm  Muscle weakness (generalized)     Problem List Patient Active Problem List   Diagnosis Date Noted  . Tubular adenoma of colon 04/29/2016  . Essential hypertension 09/12/2015  . Allergic rhinitis 07/17/2015  . Insomnia 07/17/2015  . Obese 04/10/2015  . S/P left TKA 04/09/2015  . Hyperglycemia 01/09/2015  . Osteopenia 12/04/2012  . 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 RESTLESS LEG SYNDROME 02/11/2009  . LEG EDEMA, BILATERAL 02/11/2009  . Hyperlipidemia 10/30/2008  . Urinary incontinence 10/30/2008  . Skin cancer 10/30/2008  . Asthma 11/21/2007  . Venous (peripheral) insufficiency 11/22/2006  . SNORING 11/22/2006    Mikle Bosworth PTA 08/25/2016, 1:18 PM  [facility] [address], [place], [place], [address] Phone: [phone]   Fax:  [phone]  Name: Erin Roach MRN: [id number] Date of Birth: 04-24-1941

## 2016-08-25 NOTE — Patient Instructions (Signed)
Adduction: Hip - Knees Together (Hook-Lying)    Lie with hips and knees bent, towel roll between knees. Push knees together. Hold for __3-5_ seconds. Rest for __3_ seconds. Repeat _20__ times. Do _1__ times a day.  * Do instead of standing band pull*  Copyright  VHI. All rights reserved.   Clam Shell 45 Degrees    Lying with hips and knees bent 45, one pillow between knees and ankles. Lift knee. Be sure pelvis does not roll backward. Do not arch back. Do _20__ times, each leg, _1__ times per day.  http://ss.exer.us/75   Copyright  VHI. All rights reserved.  Bridge    Lie back, legs bent. Inhale, pressing hips up. Keeping ribs in, lengthen lower back. Exhale, rolling down along spine from top. Repeat __10__ times. Do __1__ sessions per day.  http://pm.exer.us/55   Copyright  VHI. All rights reserved.   Erin Roach, PTA 08/25/16 12:54 PM  [facility] [address] [place], [place] [address] Phone # [phone] Fax [phone]

## 2016-08-26 ENCOUNTER — Encounter: Payer: Self-pay | Admitting: Internal Medicine

## 2016-08-27 ENCOUNTER — Telehealth: Payer: Self-pay

## 2016-08-27 ENCOUNTER — Encounter: Payer: Self-pay | Admitting: Internal Medicine

## 2016-08-27 NOTE — Telephone Encounter (Signed)
Pt called to report that she had pain and swelling after the last session.  Pt will see MD on Monday and PT advised that she rest and cancel for Friday.  Pt will call to let us know after her MD appointment.

## 2016-08-28 ENCOUNTER — Encounter: Payer: Medicare Other | Admitting: Physical Therapy

## 2016-09-01 ENCOUNTER — Encounter: Payer: Self-pay | Admitting: Physical Therapy

## 2016-09-01 ENCOUNTER — Ambulatory Visit: Payer: Medicare Other | Admitting: Physical Therapy

## 2016-09-01 DIAGNOSIS — M545 Low back pain: Secondary | ICD-10-CM

## 2016-09-01 DIAGNOSIS — M62838 Other muscle spasm: Secondary | ICD-10-CM

## 2016-09-01 DIAGNOSIS — M6281 Muscle weakness (generalized): Secondary | ICD-10-CM

## 2016-09-01 NOTE — Therapy (Signed)
[facility] [address], [place], [place], [address] Phone: [phone]   Fax:  [phone]  Physical Therapy Treatment  Patient Details  Name: Erin Roach MRN: [id number] Date of Birth: 1940/12/20 Referring Provider: Dewaine Oats, MD  Encounter Date: 09/01/2016      PT End of Session - 09/01/16 1303    Visit Number 6   Number of Visits 10   Date for PT Re-Evaluation 09/22/16   Authorization Type gcodes at 10th visit; KX at 53   PT Start Time 1231   PT Stop Time 1314   PT Time Calculation (min) 43 min   Activity Tolerance Patient tolerated treatment well   Behavior During Therapy [facility] for tasks assessed/performed      Past Medical History:  Diagnosis Date  . Arthritis   . Asthma   . Carotid artery disease ([place])    a. Carotid duplex 03/2016 - duplex was stable, 37-10% RICA, 6-26% LICA  . Chest pain    a. normal coronaries 2010.  [name] Esophageal reflux   . Essential hypertension 09/12/2015  . Heart murmur   . Hyperlipidemia   . Hypertension   . Leg edema    bilateral  . LVH (left ventricular hypertrophy)    a.  2D echo 2010: EF 75-80%, hyperdynamic LV with near cavity oblieration, mild LVH, grade 1 DD.  [name] Obesity   . RLS (restless legs syndrome)   . Skin cancer    hx of squamous cell buttocks area, Yevette Edwards MD,Gen Surgery: Amy Martinique MD, derm  . Snoring    no sleep study  . Urinary incontinence   . Venous insufficiency    legs    Past Surgical History:  Procedure Laterality Date  . APPENDECTOMY    . COLONOSCOPY     neg; Dr Collene Mares  . CYSTOSCOPY  2004   ? negative, Dr Jeffie Pollock  . skin cancer removal     squamous cell interglureal space  . TONSILLECTOMY    . 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 Ulnar Nerve Surgery     04/20/13:  pt denies    There  were no vitals filed for this visit.      Subjective Assessment - 09/01/16 1232    Subjective Pt reports going to MD regarding knee pain and having some increased swelling in knee after last session. Today no pain in back. Had some back and knee pain after working in the yard but that's better now.    Pertinent History back surgery    Limitations Walking   How long can you walk comfortably? 20-30 minutes with increased pain (7 minutes is start of pain)   Patient Stated Goals be able to take hour walks and be more active   Currently in Pain? No/denies   Pain Score 0-No pain                         [facility] Adult PT Treatment/Exercise - 09/01/16 0001      Exercises   Exercises Other Exercises   Other Exercises  Seated ball raises for oblique core stability     Lumbar Exercises: Stretches   ITB Stretch 2 reps;10 seconds   Piriformis Stretch 3 reps;20 seconds     Lumbar Exercises: Supine   Glut Set 20 reps  With  ball squeeze   Bridge 20 reps  With ball between knees   Large Ball Abdominal Isometric 20 reps;3 seconds  press down, and ball overhead   Other Supine Lumbar Exercises --     Lumbar Exercises: Sidelying   Clam 10 reps   Hip Abduction 10 reps     Knee/Hip Exercises: Stretches   Active Hamstring Stretch Both;2 reps;10 seconds   Gastroc Stretch Both;2 reps;10 seconds     Knee/Hip Exercises: Aerobic   Nustep L2 x 10 min      Knee/Hip Exercises: Standing   Hip Flexion Stengthening;Both;2 sets;10 reps   Hip Abduction Stengthening;Both;20 reps   Hip Extension Stengthening;Both;2 sets;10 reps                  PT Short Term Goals - 09/01/16 1239      PT SHORT TERM GOAL #2   Title pt will report pain 4/10 at most due to improved soft tissue length   Baseline at most it will get up to a 7/10   Time 4   Period Weeks   Status On-going     PT SHORT TERM GOAL #3   Title pt will be able to tolerate 10 minute walks 3x/day for reintroduction to  walking routine   Status Achieved     PT SHORT TERM GOAL #4   Title MMT 4+/5 for bilateral hip flexion and external rotation for improved functional movements   Time 4   Period Weeks   Status On-going           PT Long Term Goals - 09/01/16 1241      PT LONG TERM GOAL #1   Title independent with advanced HEP   Time 8   Period Weeks   Status On-going     PT LONG TERM GOAL #2   Title able to take 30 min walk up to 2x/day for return to normal activity   Baseline walking once a day   Time 8   Period Weeks   Status On-going               Plan - 09/01/16 1317    Clinical Impression Statement Pt having increased knee pain and was given a referral from orthopedic surgeon for therapy. Pt's current PT order is from spinal surgeon. Pt is planning to get a new referral from general practitioner for both back and knee to continue to progress strengthening for both. Pt continues to have weakness and core but reports less pain throughout day. Pt will continue to benefit from skilled thearpy for strength and stability.    Rehab Potential Excellent   Clinical Impairments Affecting Rehab Potential history of back surgery and chronic pain   PT Frequency 2x / week   PT Duration 8 weeks   PT Next Visit Plan supine core and hip strenghtening, standing exercises as tolerated, flexibility to decrease tightness   Consulted and Agree with Plan of Care Patient      Patient will benefit from skilled therapeutic intervention in order to improve the following deficits and impairments:  Abnormal gait, Decreased coordination, Decreased endurance, Decreased range of motion, Difficulty walking, Decreased strength, Pain, Impaired flexibility, Postural dysfunction  Visit Diagnosis: Acute bilateral low back pain, with sciatica presence unspecified  Other muscle spasm  Muscle weakness (generalized)     Problem List Patient Active Problem List   Diagnosis Date Noted  . Tubular adenoma of  colon 04/29/2016  . Essential hypertension 09/12/2015  . Allergic rhinitis  07/17/2015  . Insomnia 07/17/2015  . Obese 04/10/2015  . S/P left TKA 04/09/2015  . Hyperglycemia 01/09/2015  . Osteopenia 12/04/2012  . 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 RESTLESS LEG SYNDROME 02/11/2009  . LEG EDEMA, BILATERAL 02/11/2009  . Hyperlipidemia 10/30/2008  . Urinary incontinence 10/30/2008  . Skin cancer 10/30/2008  . Asthma 11/21/2007  . Venous (peripheral) insufficiency 11/22/2006  . SNORING 11/22/2006    Mikle Bosworth PTA 09/01/2016, 1:19 PM  [facility] [address], [place], [place], [address] Phone: [phone]   Fax:  [phone]  Name: Erin Roach MRN: [id number] Date of Birth: 02-May-1941

## 2016-09-02 ENCOUNTER — Ambulatory Visit: Payer: Medicare Other | Admitting: Neurology

## 2016-09-08 HISTORY — PX: CATARACT EXTRACTION W/ INTRAOCULAR LENS IMPLANT: SHX1309

## 2016-09-08 HISTORY — PX: IR US GUIDANCE: IMG2393

## 2016-09-09 ENCOUNTER — Other Ambulatory Visit: Payer: Self-pay | Admitting: Internal Medicine

## 2016-09-09 ENCOUNTER — Encounter: Payer: Medicare Other | Admitting: Physical Therapy

## 2016-09-15 ENCOUNTER — Encounter: Payer: Self-pay | Admitting: Internal Medicine

## 2016-09-15 ENCOUNTER — Encounter: Payer: Self-pay | Admitting: Emergency Medicine

## 2016-09-15 ENCOUNTER — Ambulatory Visit (INDEPENDENT_AMBULATORY_CARE_PROVIDER_SITE_OTHER): Payer: Medicare Other | Admitting: Internal Medicine

## 2016-09-15 VITALS — BP 168/68 | HR 94 | Temp 98.3°F | Resp 16 | Wt 218.0 lb

## 2016-09-15 DIAGNOSIS — G2581 Restless legs syndrome: Secondary | ICD-10-CM | POA: Diagnosis not present

## 2016-09-15 MED ORDER — PRAMIPEXOLE DIHYDROCHLORIDE 0.5 MG PO TABS: ORAL_TABLET | ORAL | 1 refills | Status: DC

## 2016-09-15 MED ORDER — HYDROCODONE-ACETAMINOPHEN 5-325 MG PO TABS: 1.0000 | ORAL_TABLET | Freq: Every day | ORAL | 0 refills | Status: DC

## 2016-09-15 NOTE — Patient Instructions (Addendum)
  Medications reviewed and updated.  Changes include resuming the hydrocodone for your RLS   Your prescription(s) have been given to you.   Please followup in 6 months

## 2016-09-15 NOTE — Progress Notes (Signed)
Pre visit review using our clinic review tool, if applicable. No additional management support is needed unless otherwise documented below in the visit note. 

## 2016-09-15 NOTE — Assessment & Plan Note (Signed)
relaxis has stopped being effective and she was advised to stop the medication by the company Continue mirapex and lyrica at current doses Restart hydrocodone - apap 5-325 mg at bedtime Pain contract signed.  She is low risk of addiction and abuse [place] controlled substance database checked Prescription given for one month Follow up in 6 months

## 2016-09-15 NOTE — Progress Notes (Signed)
Subjective:    Patient ID: Erin Roach, female    DOB: February 08, 1941, 76 y.o.   MRN: [id number]  HPI She is here for an acute visit.   Leg pain/ RLS:  She was using relaxis for a long time and it was effective, but stopped being effective.  The company told her to stop using it for a while and hopefully in the future it may work again.  She is and has been taking the mirapex and lyrica.  She was on hydrocodone-acet in the past, but has not used it recently.  She needs to get this renewed because it does help.  She has seen neuro and pulm in the past, but no longer needs to follow with them.  I have agreed to prescribe the hydrocodone, because everything else she has tried has not been effective.    She has increased stress which is why she feels her BP is elevated.  She is undergoing evaluation for a uterine tumor and it could possibly be cancer.  She will need surgery.   Medications and allergies reviewed with patient and updated if appropriate.  Patient Active Problem List   Diagnosis Date Noted  . Tubular adenoma of colon 04/29/2016  . Essential hypertension 09/12/2015  . Allergic rhinitis 07/17/2015  . Insomnia 07/17/2015  . Obese 04/10/2015  . S/P left TKA 04/09/2015  . Hyperglycemia 01/09/2015  . Osteopenia 12/04/2012  . 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 RESTLESS LEG SYNDROME 02/11/2009  . LEG EDEMA, BILATERAL 02/11/2009  . Hyperlipidemia 10/30/2008  . Urinary incontinence 10/30/2008  . Skin cancer 10/30/2008  . Asthma 11/21/2007  . Venous (peripheral) insufficiency 11/22/2006  . SNORING 11/22/2006    Current Outpatient Prescriptions on File Prior to Visit  Medication Sig Dispense Refill  . furosemide (LASIX) 20 MG tablet TAKE ONE TABLET BY MOUTH DAILY 90 tablet 3  . levocetirizine (XYZAL) 5 MG tablet TAKE 1 TABLET BY MOUTH EVERY EVENING. 90 tablet 3  . losartan (COZAAR) 100 MG tablet TAKE 1 TABLET BY MOUTH DAILY. 90 tablet 2  . MYRBETRIQ 25 MG TB24 tablet TAKE TWO  TABLETS BY MOUTH DAILY 180 tablet 1  . pregabalin (LYRICA) 75 MG capsule TAKE 1 CAPSULE BY MOUTH IN AFTERNOON, AND 2 CAPSULES AT BEDTIME 270 capsule 1  . simvastatin (ZOCOR) 40 MG tablet Take 1 tablet (40 mg total) by mouth at bedtime. 90 tablet 3  . [DISCONTINUED] VESICARE 10 MG tablet TAKE 1 TABLET EVERY DAY 30 tablet 0   No current facility-administered medications on file prior to visit.     Past Medical History:  Diagnosis Date  . Arthritis   . Asthma   . Carotid artery disease ([place])    a. Carotid duplex 03/2016 - duplex was stable, 93-26% RICA, 7-12% LICA  . Chest pain    a. normal coronaries 2010.  [name] Esophageal reflux   . Essential hypertension 09/12/2015  . Heart murmur   . Hyperlipidemia   . Hypertension   . Leg edema    bilateral  . LVH (left ventricular hypertrophy)    a.  2D echo 2010: EF 75-80%, hyperdynamic LV with near cavity oblieration, mild LVH, grade 1 DD.  [name] Obesity   . RLS (restless legs syndrome)   . Skin cancer    hx of squamous cell buttocks area, Yevette Edwards MD,Gen Surgery: Amy Martinique MD, derm  . Snoring    no sleep study  . Urinary incontinence   . Venous insufficiency  legs    Past Surgical History:  Procedure Laterality Date  . APPENDECTOMY    . COLONOSCOPY     neg; Dr Collene Mares  . CYSTOSCOPY  2004   ? negative, Dr Jeffie Pollock  . skin cancer removal     squamous cell interglureal space  . TONSILLECTOMY    . 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 Ulnar Nerve Surgery     04/20/13:  pt denies    Social History   Social History  . Marital status: Married    Spouse name: N/A  . Number of children: N/A  . Years of education: N/A   Occupational History  . retired Pharmacist, hospital    Social History Main Topics  . Smoking status: Former Smoker    Packs/day: 0.50    Years: 15.00    Types: Cigarettes  . Smokeless  tobacco: Never Used     Comment: smoked age [age], up to 1 ppd  . Alcohol use 1.8 oz/week    3 Glasses of wine per week     Comment: socially  . Drug use: No  . Sexual activity: Not on file   Other Topics Concern  . Not on file   Social History Narrative   2 children ages 20,35   Regular exercise no    Family History  Problem Relation Age of Onset  . Esophageal cancer Father   . Heart disease Father     Unknown what kind  . Hyperlipidemia Mother   . CAD Sister     59 years younger than patient - had stent. Was told that her anorexia/bulimia may have played a role  . Stroke Maternal Grandmother 66  . Diabetes Maternal Grandmother   . Heart attack Maternal Grandfather 93  . COPD Maternal Grandfather   . Heart attack Paternal Grandmother     2s  . Diabetes      MGGM    Review of Systems     Objective:   Vitals:   09/15/16 1622  BP: (!) 168/68  Pulse: 94  Resp: 16  Temp: 98.3 F (36.8 C)   Filed Weights   09/15/16 1622  Weight: 218 lb (98.9 kg)   Body mass index is 35.19 kg/m.  Wt Readings from Last 3 Encounters:  09/15/16 218 lb (98.9 kg)  06/03/16 204 lb (92.5 kg)  05/21/16 204 lb 9.6 oz (92.8 kg)     Physical Exam        Assessment & Plan:   See Problem List for Assessment and Plan of chronic medical problems.

## 2016-09-16 ENCOUNTER — Encounter: Payer: Self-pay | Admitting: Physical Therapy

## 2016-09-16 ENCOUNTER — Ambulatory Visit: Payer: Medicare Other | Attending: Orthopaedic Surgery | Admitting: Physical Therapy

## 2016-09-16 DIAGNOSIS — M6281 Muscle weakness (generalized): Secondary | ICD-10-CM | POA: Insufficient documentation

## 2016-09-16 DIAGNOSIS — M545 Low back pain: Secondary | ICD-10-CM

## 2016-09-16 DIAGNOSIS — M62838 Other muscle spasm: Secondary | ICD-10-CM | POA: Insufficient documentation

## 2016-09-16 NOTE — Therapy (Signed)
[facility] [address] [place], [place], [address] Phone: [phone]   Fax:  [phone]  Physical Therapy Treatment  Patient Details  Name: Erin Roach MRN: [id number] Date of Birth: [birth year] Referring Provider: Dewaine Oats, MD  Encounter Date: 09/16/2016      PT End of Session - 09/16/16 1408    Visit Number 7   Number of Visits 10   Date for PT Re-Evaluation 09/22/16   Authorization Type gcodes at 10th visit; KX at 48   PT Start Time 1403   PT Stop Time 1443   PT Time Calculation (min) 40 min   Activity Tolerance Patient tolerated treatment well   Behavior During Therapy [facility] for tasks assessed/performed      Past Medical History:  Diagnosis Date  . Arthritis   . Asthma   . Carotid artery disease ([place])    a. Carotid duplex 03/2016 - duplex was stable, 30-09% RICA, 2-33% LICA  . Chest pain    a. normal coronaries 2010.  [name] Esophageal reflux   . Essential hypertension 09/12/2015  . Heart murmur   . Hyperlipidemia   . Hypertension   . Leg edema    bilateral  . LVH (left ventricular hypertrophy)    a.  2D echo 2010: EF 75-80%, hyperdynamic LV with near cavity oblieration, mild LVH, grade 1 DD.  [name] Obesity   . RLS (restless legs syndrome)   . Skin cancer    hx of squamous cell buttocks area, Yevette Edwards MD,Gen Surgery: Amy Martinique MD, derm  . Snoring    no sleep study  . Urinary incontinence   . Venous insufficiency    legs    Past Surgical History:  Procedure Laterality Date  . APPENDECTOMY    . COLONOSCOPY     neg; Dr Collene Mares  . CYSTOSCOPY  2004   ? negative, Dr Jeffie Pollock  . skin cancer removal     squamous cell interglureal space  . TONSILLECTOMY    . 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 Ulnar Nerve Surgery     04/20/13:  pt denies    There  were no vitals filed for this visit.      Subjective Assessment - 09/16/16 1406    Subjective Patient reports having increased pain in Rt big toe due to arthritis. Knee pain is not as bad as foot pain. Denies pain in back.    Pertinent History back surgery    Limitations Walking   How long can you walk comfortably? 20-30 minutes with increased pain (7 minutes is start of pain)   Patient Stated Goals be able to take hour walks and be more active   Currently in Pain? No/denies   Pain Score 0-No pain                         [facility] Adult PT Treatment/Exercise - 09/16/16 0001      Neuro Re-ed    Neuro Re-ed Details  Activation of abdominals with activities; activation of glute and hip internal rotators     Lumbar Exercises: Stretches   ITB Stretch 2 reps;10 seconds   Piriformis Stretch 3 reps;20 seconds     Lumbar Exercises: Machines for Strengthening   Leg Press #70 Bil; #45 single 2x10  Seat 6  Lumbar Exercises: Supine   Glut Set 20 reps  With ball squeeze   Bridge 20 reps  With ball between knees   Straight Leg Raise 10 reps   Large Ball Abdominal Isometric 20 reps;3 seconds  press down, and ball overhead     Lumbar Exercises: Sidelying   Clam 10 reps   Hip Abduction 10 reps     Knee/Hip Exercises: Aerobic   Nustep L2 x 10 min   Therapist present to discuss treatment     Knee/Hip Exercises: Standing   SLS 2x30 seconds each   On blue pod                  PT Short Term Goals - 09/16/16 1408      PT SHORT TERM GOAL #2   Title pt will report pain 4/10 at most due to improved soft tissue length   Time 4   Period Weeks   Status Achieved     PT SHORT TERM GOAL #3   Title pt will be able to tolerate 10 minute walks 3x/day for reintroduction to walking routine   Status Achieved     PT SHORT TERM GOAL #4   Title MMT 4+/5 for bilateral hip flexion and external rotation for improved functional movements   Time 4   Period Weeks   Status  On-going           PT Long Term Goals - 09/16/16 1410      PT LONG TERM GOAL #1   Title independent with advanced HEP   Time 8   Period Weeks   Status On-going     PT LONG TERM GOAL #2   Title able to take 30 min walk up to 2x/day for return to normal activity   Baseline Unable due to foot pain   Time 8   Period Weeks   Status On-going               Plan - 09/16/16 1458    Clinical Impression Statement Patient has been having increased toe pain the past few days especially with walking. Patient has reported increased knee pain since start of therapy but this has gotten better. Patient did well with all core exercises including leg press and single leg stance with no increase in knee or toe pain. Patient reports relief from toes after gastroc stretch.  Patient will continue to benefit from skilled thearpy for core strength stability.    Rehab Potential Excellent   Clinical Impairments Affecting Rehab Potential history of back surgery and chronic pain   PT Frequency 2x / week   PT Duration 8 weeks   PT Treatment/Interventions ADLs/Self Care Home Management;Biofeedback;Cryotherapy;Moist Heat;Gait training;Stair training;Functional mobility training;Electrical Stimulation;Therapeutic activities;Therapeutic exercise;Balance training;Neuromuscular re-education;Patient/family education;Manual techniques;Dry needling;Taping   PT Next Visit Plan Progress standing exercises as tolerated by knee and toe   Consulted and Agree with Plan of Care Patient      Patient will benefit from skilled therapeutic intervention in order to improve the following deficits and impairments:  Abnormal gait, Decreased coordination, Decreased endurance, Decreased range of motion, Difficulty walking, Decreased strength, Pain, Impaired flexibility, Postural dysfunction  Visit Diagnosis: Acute bilateral low back pain, with sciatica presence unspecified  Other muscle spasm  Muscle weakness  (generalized)     Problem List Patient Active Problem List   Diagnosis Date Noted  . Tubular adenoma of colon 04/29/2016  . Essential hypertension 09/12/2015  . Allergic rhinitis 07/17/2015  . Insomnia 07/17/2015  .  Obese 04/10/2015  . S/P left TKA 04/09/2015  . Hyperglycemia 01/09/2015  . Osteopenia 12/04/2012  . 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 RESTLESS LEG SYNDROME 02/11/2009  . LEG EDEMA, BILATERAL 02/11/2009  . Hyperlipidemia 10/30/2008  . Urinary incontinence 10/30/2008  . Skin cancer 10/30/2008  . Asthma 11/21/2007  . Venous (peripheral) insufficiency 11/22/2006  . SNORING 11/22/2006    Mikle Bosworth PTA 09/16/2016, 3:20 PM   [facility] [address], [place], [place], [address] Phone: [phone]   Fax:  [phone]  Name: AVERI KILTY MRN: [id number] Date of Birth: 1940-11-25

## 2016-09-16 NOTE — Patient Instructions (Signed)
Foot Arch Stretch (Plantar Fascia)    Sitting on edge of chair, place foot on top of rolling pin and gently roll foot forward and backward over rolling pin. Feel stretch in arch of foot. Repeat with other foot. Do for 1 minute each foot.    Hip Flexion / Knee Extension: Straight-Leg Raise (Eccentric)    Lie on back. Lift leg with knee straight. Slowly lower leg for 3-5 seconds. _10__ reps per set, _2__ sets, _3__ days per week. Lower like elevator, stopping at each floor.   Copyright  VHI. All rights reserved.   Balance: One-Leg    Stand on left leg on Foot Triangle of Support. Use hand supports. Let go with left hand, then right hand. Support as little as possible but as much as needed. Stand _30__ seconds. Repeat, standing on right leg.  Copyright  VHI. All rights reserved.     Mikle Bosworth, PTA 09/16/16 2:20 PM  [facility] [address] [place], [place] [address] Phone # [phone] Fax [phone]

## 2016-09-17 ENCOUNTER — Other Ambulatory Visit: Payer: Self-pay | Admitting: Physician Assistant

## 2016-09-18 ENCOUNTER — Ambulatory Visit: Payer: Medicare Other | Admitting: Physical Therapy

## 2016-09-18 ENCOUNTER — Encounter: Payer: Self-pay | Admitting: Physical Therapy

## 2016-09-18 DIAGNOSIS — M6281 Muscle weakness (generalized): Secondary | ICD-10-CM

## 2016-09-18 DIAGNOSIS — M545 Low back pain: Secondary | ICD-10-CM | POA: Diagnosis not present

## 2016-09-18 DIAGNOSIS — M62838 Other muscle spasm: Secondary | ICD-10-CM

## 2016-09-18 NOTE — Therapy (Signed)
[facility] [address], [place], [place], [address] Phone: [phone]   Fax:  [phone]  Physical Therapy Treatment  Patient Details  Name: Erin Roach MRN: [id number] Date of Birth: 1941-03-30 Referring Provider: Dewaine Oats, MD  Encounter Date: 09/18/2016      PT End of Session - 09/18/16 1107    Visit Number 8   Number of Visits 10   Date for PT Re-Evaluation 09/22/16   Authorization Type gcodes at 10th visit; KX at 56   PT Start Time 1103   PT Stop Time 1145   PT Time Calculation (min) 42 min   Activity Tolerance Patient tolerated treatment well   Behavior During Therapy [facility] for tasks assessed/performed      Past Medical History:  Diagnosis Date  . Arthritis   . Asthma   . Carotid artery disease ([place])    a. Carotid duplex 03/2016 - duplex was stable, 62-94% RICA, 7-65% LICA  . Chest pain    a. normal coronaries 2010.  [name] Esophageal reflux   . Essential hypertension 09/12/2015  . Heart murmur   . Hyperlipidemia   . Hypertension   . Leg edema    bilateral  . LVH (left ventricular hypertrophy)    a.  2D echo 2010: EF 75-80%, hyperdynamic LV with near cavity oblieration, mild LVH, grade 1 DD.  [name] Obesity   . RLS (restless legs syndrome)   . Skin cancer    hx of squamous cell buttocks area, Yevette Edwards MD,Gen Surgery: Amy Martinique MD, derm  . Snoring    no sleep study  . Urinary incontinence   . Venous insufficiency    legs    Past Surgical History:  Procedure Laterality Date  . APPENDECTOMY    . COLONOSCOPY     neg; Dr Collene Mares  . CYSTOSCOPY  2004   ? negative, Dr Jeffie Pollock  . skin cancer removal     squamous cell interglureal space  . TONSILLECTOMY    . 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 Ulnar Nerve Surgery     04/20/13:  pt denies    There  were no vitals filed for this visit.      Subjective Assessment - 09/18/16 1105    Subjective Patient reports less pain in big toes, has been stretching foot at home. Back pain is there, no pain at the moment. Patient reports having done a lot of yard after last therapy session and was tired but not bad pain.    Pertinent History back surgery    Limitations Walking   How long can you walk comfortably? 20-30 minutes with increased pain (7 minutes is start of pain)   Patient Stated Goals be able to take hour walks and be more active   Currently in Pain? No/denies   Pain Score 0-No pain                         [facility] Adult PT Treatment/Exercise - 09/18/16 0001      Exercises   Exercises Other Exercises   Other Exercises  Seated ball raises for oblique core stability     Lumbar Exercises: Stretches   ITB Stretch 2 reps;10 seconds   Piriformis Stretch 3 reps;20 seconds     Lumbar Exercises: Machines for Strengthening   Leg  Press #70 Bil; #45 single 2x10  Seat 6     Lumbar Exercises: Seated   Sit to Stand 10 reps  with red ball; VC for abdominal brace     Lumbar Exercises: Supine   Glut Set --   Clam 20 reps  red band   Bridge 20 reps  With ball between knees   Straight Leg Raise 20 reps  #2   Large Ball Abdominal Isometric --     Lumbar Exercises: Sidelying   Clam --   Hip Abduction 20 reps  #2     Knee/Hip Exercises: Aerobic   Nustep L2 x 10 min   Therapist present to discuss treatment     Knee/Hip Exercises: Standing   SLS 2x30 seconds each   On blue pod                  PT Short Term Goals - 09/16/16 1408      PT SHORT TERM GOAL #2   Title pt will report pain 4/10 at most due to improved soft tissue length   Time 4   Period Weeks   Status Achieved     PT SHORT TERM GOAL #3   Title pt will be able to tolerate 10 minute walks 3x/day for reintroduction to walking routine   Status Achieved     PT SHORT TERM GOAL #4   Title MMT  4+/5 for bilateral hip flexion and external rotation for improved functional movements   Time 4   Period Weeks   Status On-going           PT Long Term Goals - 09/16/16 1410      PT LONG TERM GOAL #1   Title independent with advanced HEP   Time 8   Period Weeks   Status On-going     PT LONG TERM GOAL #2   Title able to take 30 min walk up to 2x/day for return to normal activity   Baseline Unable due to foot pain   Time 8   Period Weeks   Status On-going               Plan - 09/18/16 1150    Clinical Impression Statement Patient reports decreased toe pain today and no back pain. Patient did well with all strengthening exercises. Continues to have weakness in core and Bil LE, especially in Lt knee. Patient will continue to benefit from skilled thearpy for strength and stability.    Rehab Potential Excellent   Clinical Impairments Affecting Rehab Potential history of back surgery and chronic pain   PT Frequency 2x / week   PT Duration 8 weeks   PT Treatment/Interventions ADLs/Self Care Home Management;Biofeedback;Cryotherapy;Moist Heat;Gait training;Stair training;Functional mobility training;Electrical Stimulation;Therapeutic activities;Therapeutic exercise;Balance training;Neuromuscular re-education;Patient/family education;Manual techniques;Dry needling;Taping   PT Next Visit Plan Progress standing exercises as tolerated by knee and toe   Consulted and Agree with Plan of Care Patient      Patient will benefit from skilled therapeutic intervention in order to improve the following deficits and impairments:  Abnormal gait, Decreased coordination, Decreased endurance, Decreased range of motion, Difficulty walking, Decreased strength, Pain, Impaired flexibility, Postural dysfunction  Visit Diagnosis: Acute bilateral low back pain, with sciatica presence unspecified  Other muscle spasm  Muscle weakness (generalized)     Problem List Patient Active Problem List    Diagnosis Date Noted  . Tubular adenoma of colon 04/29/2016  . Essential hypertension 09/12/2015  . Allergic rhinitis 07/17/2015  . Insomnia  07/17/2015  . Obese 04/10/2015  . S/P left TKA 04/09/2015  . Hyperglycemia 01/09/2015  . Osteopenia 12/04/2012  . 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 RESTLESS LEG SYNDROME 02/11/2009  . LEG EDEMA, BILATERAL 02/11/2009  . Hyperlipidemia 10/30/2008  . Urinary incontinence 10/30/2008  . Skin cancer 10/30/2008  . Asthma 11/21/2007  . Venous (peripheral) insufficiency 11/22/2006  . SNORING 11/22/2006    Mikle Bosworth PTA 09/18/2016, 11:58 AM  [facility] [address] [place], [place], [address] Phone: [phone]   Fax:  [phone]  Name: Erin Roach MRN: [id number] Date of Birth: Aug 01, 1940

## 2016-09-22 ENCOUNTER — Ambulatory Visit: Payer: Medicare Other | Admitting: Physical Therapy

## 2016-09-23 ENCOUNTER — Ambulatory Visit: Payer: Medicare Other | Admitting: Physical Therapy

## 2016-09-23 DIAGNOSIS — M545 Low back pain: Secondary | ICD-10-CM | POA: Diagnosis not present

## 2016-09-23 DIAGNOSIS — M6281 Muscle weakness (generalized): Secondary | ICD-10-CM

## 2016-09-23 DIAGNOSIS — M62838 Other muscle spasm: Secondary | ICD-10-CM

## 2016-09-23 NOTE — Therapy (Signed)
[facility] [address] [place], [place], [address] Phone: [phone]   Fax:  [phone]  Physical Therapy Treatment  Patient Details  Name: Erin Roach MRN: [id number] Date of Birth: 08-10-1940 Referring Provider: Dewaine Oats, MD  Encounter Date: 09/23/2016      PT End of Session - 09/23/16 1531    Visit Number 9   Number of Visits 10   Date for PT Re-Evaluation 11/18/16   Authorization Type gcodes at 10th visit; KX at 2   PT Start Time 1533   PT Stop Time 1616   PT Time Calculation (min) 43 min   Activity Tolerance Patient tolerated treatment well   Behavior During Therapy [facility] for tasks assessed/performed      Past Medical History:  Diagnosis Date  . Arthritis   . Asthma   . Carotid artery disease ([place])    a. Carotid duplex 03/2016 - duplex was stable, 08-14% RICA, 4-81% LICA  . Chest pain    a. normal coronaries 2010.  [name] Esophageal reflux   . Essential hypertension 09/12/2015  . Heart murmur   . Hyperlipidemia   . Hypertension   . Leg edema    bilateral  . LVH (left ventricular hypertrophy)    a.  2D echo 2010: EF 75-80%, hyperdynamic LV with near cavity oblieration, mild LVH, grade 1 DD.  [name] Obesity   . RLS (restless legs syndrome)   . Skin cancer    hx of squamous cell buttocks area, Yevette Edwards MD,Gen Surgery: Amy Martinique MD, derm  . Snoring    no sleep study  . Urinary incontinence   . Venous insufficiency    legs    Past Surgical History:  Procedure Laterality Date  . APPENDECTOMY    . COLONOSCOPY     neg; Dr Collene Mares  . CYSTOSCOPY  2004   ? negative, Dr Jeffie Pollock  . skin cancer removal     squamous cell interglureal space  . TONSILLECTOMY    . 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 Ulnar Nerve Surgery     04/20/13:  pt denies    There  were no vitals filed for this visit.      Subjective Assessment - 09/23/16 1534    Subjective My back does not hurt generally but a lot of nerve issues in my legs.  I am doing exercises 6 days/week.  My left knee has been hurting me.  Walking is limited because of my knee and nerve pain in my legs.   Pertinent History back surgery    How long can you walk comfortably? 20-30 minutes with increased pain (7 minutes is start of pain)   Patient Stated Goals be able to take hour walks and be more active   Currently in Pain? No/denies            [facility] PT Assessment - 09/23/16 0001      Observation/Other Assessments   Focus on Therapeutic Outcomes (FOTO)  47% limited     Strength   Overall Strength Comments core strength 4/5; right LE strength hip flexion and external rotation 4+/5; Left hip flexion, extension external rotation 4-/5     Palpation   Palpation comment left adductors, left patellar tendons, left medial knee joint line, left pes ansurine tender to palpation  [facility] Adult PT Treatment/Exercise - 09/23/16 0001      Lumbar Exercises: Stretches   Piriformis Stretch 3 reps;20 seconds     Lumbar Exercises: Supine   Bridge 20 reps   Bridge Limitations right 10x single leg; left 5x single leg   Other Supine Lumbar Exercises hip internal rotation stretch and butterfly stretch - 2 min each     Knee/Hip Exercises: Aerobic   Nustep L3 x 10 min   Therapist present to discuss treatment     Knee/Hip Exercises: Standing   Walking with Sports Cord forward and back 5 x each way     Knee/Hip Exercises: Prone   Hip Extension Strengthening;Right;Left;2 sets;10 reps                PT Education - 09/23/16 1718    Education provided Yes   Education Details hip stretches and hip extension exercise   Person(s) Educated Patient   Methods Explanation;Demonstration;Tactile cues;Verbal cues;Handout   Comprehension Verbalized understanding;Returned  demonstration          PT Short Term Goals - 09/23/16 1736      PT SHORT TERM GOAL #4   Title MMT 4+/5 for bilateral hip flexion and external rotation for improved functional movements   Time 4   Period Weeks   Status On-going           PT Long Term Goals - 09/23/16 1545      PT LONG TERM GOAL #1   Title independent with advanced HEP   Time 8   Period Weeks   Status On-going     PT LONG TERM GOAL #2   Title able to take 30 min walk up to 2x/day for return to normal activity   Baseline can walk 10 minutes 2x/day   Time 8   Period Weeks   Status On-going     PT LONG TERM GOAL #3   Title pain reduced by 50% during the most aggravating activity for improved function   Baseline it has disappeared for a little while but it has come back, it is better though   Time 8   Period Weeks   Status On-going     PT LONG TERM GOAL #4   Title FOTO < or = to 42%    Baseline 47%   Time 8   Period Weeks   Status On-going     PT LONG TERM GOAL #5   Title core strength improved in order to demonstrate improved posture during functional sit<>stand and walking with good alignment   Time 8   Period Weeks   Status On-going               Plan - 09/23/16 1542    Clinical Impression Statement Patient is making slow progress due to inconsistancies in attending appointments, but patient continues to progress with increased strength and reduced tightness overall.  Patient continues to have core and LE weakness and has been noticing pain in left knee more recently.  This pain most likely due to adjustments in patient's gait with heel lift.  Patient has tenderness around left knee tendons and medial structures and pain with walking as well as continues to have pain in both legs with walking . She has strength asymmetries in LE and continues to have some weakness in core.  Patient will benefit from continued skilled PT in order to continue working on muslce imbalances in LE and continued  core strength so she can return to walking  as part of healthy lifestyle.     Rehab Potential Excellent   Clinical Impairments Affecting Rehab Potential history of back surgery and chronic pain   PT Frequency 2x / week   PT Duration 8 weeks   PT Treatment/Interventions ADLs/Self Care Home Management;Biofeedback;Cryotherapy;Moist Heat;Gait training;Stair training;Functional mobility training;Electrical Stimulation;Therapeutic activities;Therapeutic exercise;Balance training;Neuromuscular re-education;Patient/family education;Manual techniques;Dry needling;Taping   PT Next Visit Plan LE and core strengthening, stretching and pain manageement to LE as needed for improved gait and reduced pain with walking   Consulted and Agree with Plan of Care Patient      Patient will benefit from skilled therapeutic intervention in order to improve the following deficits and impairments:  Abnormal gait, Decreased coordination, Decreased endurance, Decreased range of motion, Difficulty walking, Decreased strength, Pain, Impaired flexibility, Postural dysfunction  Visit Diagnosis: Acute bilateral low back pain, with sciatica presence unspecified - Plan: PT plan of care cert/re-cert  Other muscle spasm - Plan: PT plan of care cert/re-cert  Muscle weakness (generalized) - Plan: PT plan of care cert/re-cert     Problem List Patient Active Problem List   Diagnosis Date Noted  . Tubular adenoma of colon 04/29/2016  . Essential hypertension 09/12/2015  . Allergic rhinitis 07/17/2015  . Insomnia 07/17/2015  . Obese 04/10/2015  . S/P left TKA 04/09/2015  . Hyperglycemia 01/09/2015  . Osteopenia 12/04/2012  . 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 RESTLESS LEG SYNDROME 02/11/2009  . LEG EDEMA, BILATERAL 02/11/2009  . Hyperlipidemia 10/30/2008  . Urinary incontinence 10/30/2008  . Skin cancer 10/30/2008  . Asthma 11/21/2007  . Venous (peripheral) insufficiency 11/22/2006  . SNORING 11/22/2006    Zannie Cove,  PT 09/23/2016, 5:40 PM  [facility] [address] [place], [place], [address] Phone: [phone]   Fax:  [phone]  Name: Erin Roach MRN: [id number] Date of Birth: [birth year]

## 2016-09-23 NOTE — Patient Instructions (Addendum)
  Rotate knee in bringing it down to the mat feeling a stretch in the hip Hold 30 sec repeat 3x   Lift leg straight back while keeping hips pressed against the mat Repeat 2 sets of 10 reps   Hold for 60 sec repeat 2x

## 2016-09-29 ENCOUNTER — Ambulatory Visit: Payer: Medicare Other | Admitting: Physical Therapy

## 2016-09-29 DIAGNOSIS — M6281 Muscle weakness (generalized): Secondary | ICD-10-CM

## 2016-09-29 DIAGNOSIS — M62838 Other muscle spasm: Secondary | ICD-10-CM

## 2016-09-29 DIAGNOSIS — M545 Low back pain: Secondary | ICD-10-CM

## 2016-09-29 NOTE — Therapy (Signed)
[facility] [address], [place], [place], [address] Phone: [phone]   Fax:  [phone]  Physical Therapy Treatment  Patient Details  Name: Erin Roach MRN: [id number] Date of Birth: [birth year] Referring Provider: Dewaine Oats, MD  Encounter Date: 09/29/2016    Past Medical History:  Diagnosis Date  . Arthritis   . Asthma   . Carotid artery disease ()    a. Carotid duplex 03/2016 - duplex was stable, 40-34% RICA, 7-42% LICA  . Chest pain    a. normal coronaries 2010.  [name] Esophageal reflux   . Essential hypertension 09/12/2015  . Heart murmur   . Hyperlipidemia   . Hypertension   . Leg edema    bilateral  . LVH (left ventricular hypertrophy)    a.  2D echo 2010: EF 75-80%, hyperdynamic LV with near cavity oblieration, mild LVH, grade 1 DD.  [name] Obesity   . RLS (restless legs syndrome)   . Skin cancer    hx of squamous cell buttocks area, Yevette Edwards MD,Gen Surgery: Amy Martinique MD, derm  . Snoring    no sleep study  . Urinary incontinence   . Venous insufficiency    legs    Past Surgical History:  Procedure Laterality Date  . APPENDECTOMY    . COLONOSCOPY     neg; Dr Collene Mares  . CYSTOSCOPY  2004   ? negative, Dr Jeffie Pollock  . skin cancer removal     squamous cell interglureal space  . TONSILLECTOMY    . 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 Ulnar Nerve Surgery     04/20/13:  pt denies    There were no vitals filed for this visit.      Subjective Assessment - 09/29/16 1407    Subjective I felt the butterfly stretch for a couple hours afterwards.  I did not try using pillows under my knees   Currently in Pain? No/denies                         [facility] Adult PT Treatment/Exercise - 09/29/16 0001      Lumbar Exercises: Stretches   Piriformis Stretch 3 reps;20 seconds     Lumbar Exercises: Machines for Strengthening   Leg Press #70 Bil; #50 single 2x10  Seat 6     Lumbar Exercises: Supine   Other Supine Lumbar Exercises hip internal rotation stretch and butterfly stretch - 2 min each     Knee/Hip Exercises: Stretches   Active Hamstring Stretch Both;2 reps;10 seconds   Gastroc Stretch Both;2 reps;10 seconds     Knee/Hip Exercises: Aerobic   Nustep L3 x 10 min   Therapist present to discuss treatment     Knee/Hip Exercises: Standing   SLS 2x30 seconds each   On blue pad   Walking with Sports Cord side, forward and back 5 x each way  20#                  PT Short Term Goals - 09/23/16 1736      PT SHORT TERM GOAL #4   Title MMT 4+/5 for bilateral hip flexion and external rotation for improved functional movements   Time 4   Period Weeks   Status On-going           PT Long  Term Goals - 09/29/16 1443      PT LONG TERM GOAL #1   Title independent with advanced HEP   Time 8   Period Weeks   Status On-going     PT LONG TERM GOAL #2   Title able to take 30 min walk up to 2x/day for return to normal activity   Time 8   Period Weeks   Status On-going     PT LONG TERM GOAL #4   Title FOTO < or = to 42%    Time 8   Period Weeks   Status On-going     PT LONG TERM GOAL #5   Title core strength improved in order to demonstrate improved posture during functional sit<>stand and walking with good alignment   Time 8   Period Weeks   Status On-going               Plan - 09/29/16 1446    Clinical Impression Statement Patient has not had changes since previous visit.  She forgot about the internal hip rotation stretch.  Patient increased difficulty with lying on foam roller and had some difficulty coordinating breathing while performing those exercises. Skilled PT needed for increased muscle coordination during activities such as gait for improved function.   PT  Treatment/Interventions ADLs/Self Care Home Management;Biofeedback;Cryotherapy;Moist Heat;Gait training;Stair training;Functional mobility training;Electrical Stimulation;Therapeutic activities;Therapeutic exercise;Balance training;Neuromuscular re-education;Patient/family education;Manual techniques;Dry needling;Taping   PT Next Visit Plan LE and core strengthening, stretching and pain manageement to LE as needed for improved gait and reduced pain with walking   Consulted and Agree with Plan of Care Patient      Patient will benefit from skilled therapeutic intervention in order to improve the following deficits and impairments:  Abnormal gait, Decreased coordination, Decreased endurance, Decreased range of motion, Difficulty walking, Decreased strength, Pain, Impaired flexibility, Postural dysfunction  Visit Diagnosis: Acute bilateral low back pain, with sciatica presence unspecified  Other muscle spasm  Muscle weakness (generalized)     Problem List Patient Active Problem List   Diagnosis Date Noted  . Tubular adenoma of colon 04/29/2016  . Essential hypertension 09/12/2015  . Allergic rhinitis 07/17/2015  . Insomnia 07/17/2015  . Obese 04/10/2015  . S/P left TKA 04/09/2015  . Hyperglycemia 01/09/2015  . Osteopenia 12/04/2012  . 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 RESTLESS LEG SYNDROME 02/11/2009  . LEG EDEMA, BILATERAL 02/11/2009  . Hyperlipidemia 10/30/2008  . Urinary incontinence 10/30/2008  . Skin cancer 10/30/2008  . Asthma 11/21/2007  . Venous (peripheral) insufficiency 11/22/2006  . SNORING 11/22/2006    Zannie Cove, PT 09/29/2016, 2:49 PM  [facility] [address], [place], [place], [address] Phone: [phone]   Fax:  [phone]  Name: Erin Roach MRN: [id number] Date of Birth: 1940/07/19

## 2016-09-30 ENCOUNTER — Other Ambulatory Visit: Payer: Self-pay | Admitting: Internal Medicine

## 2016-10-01 ENCOUNTER — Other Ambulatory Visit: Payer: Self-pay | Admitting: Nurse Practitioner

## 2016-10-02 ENCOUNTER — Encounter: Payer: Self-pay | Admitting: Physical Therapy

## 2016-10-02 ENCOUNTER — Ambulatory Visit: Payer: Medicare Other | Admitting: Physical Therapy

## 2016-10-02 DIAGNOSIS — M545 Low back pain: Secondary | ICD-10-CM

## 2016-10-02 DIAGNOSIS — M62838 Other muscle spasm: Secondary | ICD-10-CM

## 2016-10-02 DIAGNOSIS — M6281 Muscle weakness (generalized): Secondary | ICD-10-CM

## 2016-10-02 NOTE — Therapy (Addendum)
[facility] [address] [place], [place], [address] Phone: [phone]   Fax:  [phone]  Physical Therapy Treatment  Patient Details  Name: Erin Roach MRN: [id number] Date of Birth: 04-05-1941 Referring Provider: Dewaine Oats, MD  Encounter Date: 10/02/2016      PT End of Session - 10/02/16 0948    Visit Number 10   Number of Visits 10   Date for PT Re-Evaluation 11/18/16   Authorization Type gcodes at 10th visit; KX at 71   PT Start Time 0931   PT Stop Time 1010   PT Time Calculation (min) 39 min   Activity Tolerance Patient tolerated treatment well   Behavior During Therapy [facility] for tasks assessed/performed      Past Medical History:  Diagnosis Date  . Arthritis   . Asthma   . Carotid artery disease ([place])    a. Carotid duplex 03/2016 - duplex was stable, 69-62% RICA, 9-52% LICA  . Chest pain    a. normal coronaries 2010.  [name] Esophageal reflux   . Essential hypertension 09/12/2015  . Heart murmur   . Hyperlipidemia   . Hypertension   . Leg edema    bilateral  . LVH (left ventricular hypertrophy)    a.  2D echo 2010: EF 75-80%, hyperdynamic LV with near cavity oblieration, mild LVH, grade 1 DD.  [name] Obesity   . RLS (restless legs syndrome)   . Skin cancer    hx of squamous cell buttocks area, Yevette Edwards MD,Gen Surgery: Amy Martinique MD, derm  . Snoring    no sleep study  . Urinary incontinence   . Venous insufficiency    legs    Past Surgical History:  Procedure Laterality Date  . APPENDECTOMY    . COLONOSCOPY     neg; Dr Collene Mares  . CYSTOSCOPY  2004   ? negative, Dr Jeffie Pollock  . skin cancer removal     squamous cell interglureal space  . TONSILLECTOMY    . 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 Ulnar Nerve Surgery     04/20/13:  pt denies    There  were no vitals filed for this visit.      Subjective Assessment - 10/02/16 0935    Subjective Knee is still bothering me. But I'm doing what I can. The back is doing good, it only hurts me very rarley. I still have nereve issues in my legs and I'm still not able to stand because of my nerves.    Pertinent History back surgery    Limitations Walking   How long can you walk comfortably? 20-30 minutes with increased pain (7 minutes is start of pain)   Patient Stated Goals be able to take hour walks and be more active   Currently in Pain? No/denies   Pain Score 0-No pain            [facility] PT Assessment - 10/02/16 0001      Observation/Other Assessments   Focus on Therapeutic Outcomes (FOTO)  47% limited                     [facility] Adult PT Treatment/Exercise - 10/02/16 0001      Lumbar Exercises: Stretches   Piriformis Stretch 3 reps;20 seconds     Lumbar Exercises: Machines for Strengthening  Leg Press #75 Bil; #55 single 2x10  Seat 6     Lumbar Exercises: Supine   Other Supine Lumbar Exercises hip internal rotation stretch and butterfly stretch - 2 min each     Knee/Hip Exercises: Stretches   Active Hamstring Stretch Both;2 reps;10 seconds   Hip Flexor Stretch Both;2 reps;10 seconds  on step   Gastroc Stretch Both;2 reps;10 seconds     Knee/Hip Exercises: Aerobic   Nustep L3 x 10 min   Therapist present to discuss treatment     Knee/Hip Exercises: Standing   SLS 2x30 seconds each   On blue pad   Walking with Sports Cord side, forward and back 5 x each way  #25 front back; #15 side                  PT Short Term Goals - 09/23/16 1736      PT SHORT TERM GOAL #4   Title MMT 4+/5 for bilateral hip flexion and external rotation for improved functional movements   Time 4   Period Weeks   Status On-going           PT Long Term Goals - 10/02/16 3300      PT LONG TERM GOAL #1   Title independent with advanced HEP   Time 8   Period  Weeks   Status On-going     PT LONG TERM GOAL #2   Title able to take 30 min walk up to 2x/day for return to normal activity   Baseline getting easier, some days I can do 30 others I don't   Status Partially Met     PT LONG TERM GOAL #3   Title pain reduced by 50% during the most aggravating activity for improved function   Status Achieved     PT LONG TERM GOAL #4   Title FOTO < or = to 42%    Baseline 47%   Time 8   Period Weeks   Status On-going               Plan - 10/02/16 1008    Clinical Impression Statement Patient with continued LE nerve pains and Lt knee pains. Patient has met long term goal for pain to be reduced by 50% and has partially met goal for wlaking 30 minutes x2 a day.  Patient continues to have tightness and weakness in Bil hips. Doing well with clam shell exercise at home but difficulty with standing hip strengthening. Patient able to tolerate all exercises well today. Will continue to benefit from skilled therapy for core strengthening and LE stability.     Rehab Potential Excellent   Clinical Impairments Affecting Rehab Potential history of back surgery and chronic pain   PT Frequency 2x / week   PT Duration 8 weeks   PT Treatment/Interventions ADLs/Self Care Home Management;Biofeedback;Cryotherapy;Moist Heat;Gait training;Stair training;Functional mobility training;Electrical Stimulation;Therapeutic activities;Therapeutic exercise;Balance training;Neuromuscular re-education;Patient/family education;Manual techniques;Dry needling;Taping   PT Next Visit Plan Supine core and hip strengthening      Patient will benefit from skilled therapeutic intervention in order to improve the following deficits and impairments:  Abnormal gait, Decreased coordination, Decreased endurance, Decreased range of motion, Difficulty walking, Decreased strength, Pain, Impaired flexibility, Postural dysfunction  Visit Diagnosis: Acute bilateral low back pain, with sciatica  presence unspecified  Other muscle spasm  Muscle weakness (generalized)  Gcodes: Mobility and walking Current: CK Goal: CK Discharge: CK Based on FOTO and clinical reasoning Zannie Cove, PT 10/02/16 12:09 PM  Problem List Patient Active Problem List   Diagnosis Date Noted  . Tubular adenoma of colon 04/29/2016  . Essential hypertension 09/12/2015  . Allergic rhinitis 07/17/2015  . Insomnia 07/17/2015  . Obese 04/10/2015  . S/P left TKA 04/09/2015  . Hyperglycemia 01/09/2015  . Osteopenia 12/04/2012  . 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 RESTLESS LEG SYNDROME 02/11/2009  . LEG EDEMA, BILATERAL 02/11/2009  . Hyperlipidemia 10/30/2008  . Urinary incontinence 10/30/2008  . Skin cancer 10/30/2008  . Asthma 11/21/2007  . Venous (peripheral) insufficiency 11/22/2006  . SNORING 11/22/2006    Mikle Bosworth, PTA 10/02/16 10:30 AM  [facility] [address] [place], [place], [address] Phone: [phone]   Fax:  [phone]  Name: SHILPA BUSHEE MRN: [id number] Date of Birth: 1940/12/04  Zannie Cove, PT 10/02/16 12:09 PM  PHYSICAL THERAPY DISCHARGE SUMMARY  Visits from Start of Care: 10  Current functional level related to goals / functional outcomes: See above   Remaining deficits: See above   Education / Equipment: HEP  Plan: Patient agrees to discharge.  Patient goals were partially met. Patient is being discharged due to not returning since the last visit.  ?????        [employer], PT 11/02/16 11:47 AM

## 2016-10-14 ENCOUNTER — Other Ambulatory Visit: Payer: Self-pay | Admitting: Physician Assistant

## 2016-10-15 ENCOUNTER — Encounter: Payer: Self-pay | Admitting: Pulmonary Disease

## 2016-10-15 NOTE — Telephone Encounter (Signed)
RA, please advise on below patient email:  --------------------------------- You may remember that I had used that Relaxis pad successfully for my restless legs. It worked for about three years, and then several months ago just ceased to produce any positive results. Dr. Quay Burow agreed to prescribe that low dose of Hydrocodone along with the Lyrica and Mirapex. That worked until about 4-6 weeks ago. Now every night, I sleep about 2 hours before the pain awakens me. I get up, walk, etc. and then sleep for about 2 hours and am awakened with leg pain again. The pain is frequent during the day, as well. I am not functioning well because of the lack of sleep, and even think I am beginning to feel some depression because of my quality of life. (Not the way I want my 7th decade to be!) My sister suggested that I visit a pain management clinic, and when I called a Dr. Hardin Negus with [facility], I was told I needed a referral. I thought you knew more about my restless legs than anyone, so thought I'd contact you for a referral.

## 2016-10-16 NOTE — Telephone Encounter (Signed)
Ok to refer - chronic pain/ restlesslegs

## 2016-10-19 ENCOUNTER — Other Ambulatory Visit: Payer: Self-pay

## 2016-10-19 DIAGNOSIS — G2581 Restless legs syndrome: Secondary | ICD-10-CM

## 2016-10-23 ENCOUNTER — Ambulatory Visit: Payer: Medicare Other | Attending: Gynecology | Admitting: Gynecology

## 2016-10-23 ENCOUNTER — Encounter: Payer: Self-pay | Admitting: Gynecology

## 2016-10-23 ENCOUNTER — Other Ambulatory Visit (HOSPITAL_COMMUNITY)
Admission: RE | Admit: 2016-10-23 | Discharge: 2016-10-23 | Disposition: A | Discharge status: Home / Self Care | Payer: Medicare Other | Source: Ambulatory Visit | Attending: Gynecology | Admitting: Gynecology

## 2016-10-23 VITALS — BP 130/50 | HR 75 | Temp 98.2°F | Resp 20 | Ht 66.5 in | Wt 216.6 lb

## 2016-10-23 DIAGNOSIS — J45909 Unspecified asthma, uncomplicated: Secondary | ICD-10-CM | POA: Diagnosis not present

## 2016-10-23 DIAGNOSIS — I872 Venous insufficiency (chronic) (peripheral): Secondary | ICD-10-CM | POA: Diagnosis not present

## 2016-10-23 DIAGNOSIS — N950 Postmenopausal bleeding: Secondary | ICD-10-CM | POA: Insufficient documentation

## 2016-10-23 DIAGNOSIS — Z833 Family history of diabetes mellitus: Secondary | ICD-10-CM | POA: Insufficient documentation

## 2016-10-23 DIAGNOSIS — D269 Other benign neoplasm of uterus, unspecified: Secondary | ICD-10-CM | POA: Diagnosis not present

## 2016-10-23 DIAGNOSIS — E669 Obesity, unspecified: Secondary | ICD-10-CM | POA: Diagnosis not present

## 2016-10-23 DIAGNOSIS — N83202 Unspecified ovarian cyst, left side: Secondary | ICD-10-CM | POA: Diagnosis present

## 2016-10-23 DIAGNOSIS — G2581 Restless legs syndrome: Secondary | ICD-10-CM | POA: Diagnosis not present

## 2016-10-23 DIAGNOSIS — R938 Abnormal findings on diagnostic imaging of other specified body structures: Secondary | ICD-10-CM

## 2016-10-23 DIAGNOSIS — D271 Benign neoplasm of left ovary: Secondary | ICD-10-CM | POA: Diagnosis not present

## 2016-10-23 DIAGNOSIS — Z79899 Other long term (current) drug therapy: Secondary | ICD-10-CM | POA: Insufficient documentation

## 2016-10-23 DIAGNOSIS — I10 Essential (primary) hypertension: Secondary | ICD-10-CM | POA: Insufficient documentation

## 2016-10-23 DIAGNOSIS — Z8249 Family history of ischemic heart disease and other diseases of the circulatory system: Secondary | ICD-10-CM | POA: Diagnosis not present

## 2016-10-23 DIAGNOSIS — Z96652 Presence of left artificial knee joint: Secondary | ICD-10-CM | POA: Insufficient documentation

## 2016-10-23 DIAGNOSIS — Z87891 Personal history of nicotine dependence: Secondary | ICD-10-CM | POA: Insufficient documentation

## 2016-10-23 DIAGNOSIS — N939 Abnormal uterine and vaginal bleeding, unspecified: Secondary | ICD-10-CM | POA: Diagnosis not present

## 2016-10-23 DIAGNOSIS — Z9889 Other specified postprocedural states: Secondary | ICD-10-CM | POA: Diagnosis not present

## 2016-10-23 DIAGNOSIS — E785 Hyperlipidemia, unspecified: Secondary | ICD-10-CM | POA: Diagnosis not present

## 2016-10-23 DIAGNOSIS — N83209 Unspecified ovarian cyst, unspecified side: Secondary | ICD-10-CM

## 2016-10-23 DIAGNOSIS — Z823 Family history of stroke: Secondary | ICD-10-CM | POA: Diagnosis not present

## 2016-10-23 DIAGNOSIS — N840 Polyp of corpus uteri: Secondary | ICD-10-CM | POA: Diagnosis present

## 2016-10-23 DIAGNOSIS — Z800 Family history of malignant neoplasm of digestive organs: Secondary | ICD-10-CM | POA: Diagnosis not present

## 2016-10-23 DIAGNOSIS — Z825 Family history of asthma and other chronic lower respiratory diseases: Secondary | ICD-10-CM | POA: Diagnosis not present

## 2016-10-23 NOTE — Patient Instructions (Signed)
We will contact you with the endometrial biopsy report and make plans thereafter.

## 2016-10-23 NOTE — Progress Notes (Signed)
Consult Note: Gyn-Onc   Erin Roach 76 y.o. female  Chief Complaint  Patient presents with  . Ovarian Cyst  . Uterine Polyps    Assessment :Dermoid cyst of the left ovary (present on CT scan dating back to 2007). Asymptomatic and unchanged.  Postmenopausal bleeding with thickened endometrial stripe. Endometrial biopsy is pending  Plan: I reviewed with the patient and her husband imaging studies dating back to 2007 showing that she had a left ovarian cyst consistent with a dermoid at that time. This appears to have not changed in size and there is no indication of malignancy. CA-125 is 8.8 units per mL.  Postmenopausal bleeding with 7 mm endometrial stripe. Endometrial biopsy is obtained. The (uterus sounded 7 cm anteriorly). Pending the report of the biopsy, we will make further recommendations. If there is evidence of a polyp I would recommend that she undergo a formal D&C.   HPI: 76 year old white married female seen in consultation at the request of Delice Bison, NP regarding management of a left ovarian cyst and postmenopausal bleeding with a thickened endometrium.  With regard to the ovarian cyst, review the patient's records show that she had CT scans in 2007 and 2010 demonstrating the cyst being approximate 3 cm in diameter and radiographically consistent with a dermoid. She had a more recent ultrasound showing this cyst as well. The patient has no other past history of gynecologic problems. She denies any pelvic pain pressure or bloating any other GI or GU symptoms. CA-125 is 8.8 units per mL.  Patient also has had intermittent postmenopausal spotting since March 2018. Ultrasound shows the endometrial stripe to be 7 mm. Attempt at an endometrial biopsy was performed although only able to advance the Pipelle 3 cm. Pathology showed benign atrophic endometrium. The patient denies any other pelvic pain or pressure.  Review of Systems:10 point review of systems is negative except  as noted in interval history.   Vitals: Blood pressure (!) 130/50, pulse 75, temperature 98.2 F (36.8 C), resp. rate 20, height 5' 6.5" (1.689 m), weight 216 lb 9.6 oz (98.2 kg), SpO2 98 %.  Physical Exam: General : The patient is a healthy woman in no acute distress.  HEENT: normocephalic, extraoccular movements normal; neck is supple without thyromegally  Lynphnodes: Supraclavicular and inguinal nodes not enlarged  Abdomen: Soft, non-tender, no ascites, no organomegally, no masses, no hernias  Pelvic:  EGBUS: Normal female  Vagina: Normal, no lesions  Urethra and Bladder: Normal, non-tender  Cervix: Normal no lesions are noted Uterus: Difficult to outline due to the patient's body habitus. Uterus sounds 6-7 cm anteriorly. Bi-manual examination: Non-tender; no adenxal masses or nodularity  Rectal: normal sphincter tone, no masses, no blood  Lower extremities: No edema or varicosities. Normal range of motion     Procedure note: The initial attempts to pass Pipelle into the cervix only extended 3 cm. The cervix is then grasped with the tenaculum and I was able to pass a Pipelle 6-7 cm. Pipelle was aspirated and endometrial tissue removed and submitted to pathology. Patient tolerated the procedure well.     No Known Allergies  Past Medical History:  Diagnosis Date  . Arthritis   . Asthma   . Carotid artery disease ([place])    a. Carotid duplex 03/2016 - duplex was stable, 40-98% RICA, 1-19% LICA  . Chest pain    a. normal coronaries 2010.  [name] Esophageal reflux   . Essential hypertension 09/12/2015  . Heart murmur   . Hyperlipidemia   .  Hypertension   . Leg edema    bilateral  . LVH (left ventricular hypertrophy)    a.  2D echo 2010: EF 75-80%, hyperdynamic LV with near cavity oblieration, mild LVH, grade 1 DD.  [name] Obesity   . RLS (restless legs syndrome)   . Skin cancer    hx of squamous cell buttocks area, Yevette Edwards MD,Gen Surgery: Amy Martinique MD, derm  . Snoring    no  sleep study  . Urinary incontinence   . Venous insufficiency    legs    Past Surgical History:  Procedure Laterality Date  . APPENDECTOMY    . COLONOSCOPY     neg; Dr Collene Mares  . CYSTOSCOPY  2004   ? negative, Dr Jeffie Pollock  . skin cancer removal     squamous cell interglureal space  . TONSILLECTOMY    . 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 Ulnar Nerve Surgery     04/20/13:  pt denies    Current Outpatient Prescriptions  Medication Sig Dispense Refill  . esomeprazole (NEXIUM) 40 MG capsule TAKE ONE CAPSULE BY MOUTH DAILY AT 12 NOON.  PLEASE CALL AND SCHEDULE APPOINTMENT FOR FURTHER REFILLS 30 capsule 4  . furosemide (LASIX) 20 MG tablet TAKE ONE TABLET BY MOUTH DAILY 90 tablet 3  . HYDROcodone-acetaminophen (NORCO/VICODIN) 5-325 MG tablet Take 1 tablet by mouth at bedtime. 30 tablet 0  . levocetirizine (XYZAL) 5 MG tablet TAKE 1 TABLET BY MOUTH EVERY EVENING. 90 tablet 3  . losartan (COZAAR) 100 MG tablet TAKE 1 TABLET BY MOUTH DAILY. 90 tablet 2  . MYRBETRIQ 25 MG TB24 tablet TAKE TWO TABLETS BY MOUTH DAILY 180 tablet 1  . pramipexole (MIRAPEX) 0.5 MG tablet Take one tablet in afternoon and one in the evening 180 tablet 1  . pregabalin (LYRICA) 75 MG capsule TAKE 1 CAPSULE BY MOUTH IN AFTERNOON, AND 2 CAPSULES AT BEDTIME 270 capsule 1  . simvastatin (ZOCOR) 40 MG tablet Take 1 tablet (40 mg total) by mouth at bedtime. 90 tablet 3   No current facility-administered medications for this visit.     Social History   Social History  . Marital status: Married    Spouse name: N/A  . Number of children: N/A  . Years of education: N/A   Occupational History  . retired Pharmacist, hospital    Social History Main Topics  . Smoking status: Former Smoker    Packs/day: 0.50    Years: 15.00    Types: Cigarettes  . Smokeless tobacco: Never Used     Comment: smoked  age [age], up to 1 ppd  . Alcohol use 1.8 oz/week    3 Glasses of wine per week     Comment: socially  . Drug use: No  . Sexual activity: Not on file   Other Topics Concern  . Not on file   Social History Narrative   2 children ages 6,35   Regular exercise no    Family History  Problem Relation Age of Onset  . Esophageal cancer Father   . Heart disease Father        Unknown what kind  . Hyperlipidemia Mother   . CAD Sister        78 years younger than patient - had stent. Was told that her anorexia/bulimia may have played a role  . Stroke Maternal Grandmother 66  .  Diabetes Maternal Grandmother   . Heart attack Maternal Grandfather 93  . COPD Maternal Grandfather   . Heart attack Paternal Grandmother        59s  . Diabetes Unknown        MGGM      Marti Sleigh, MD 10/23/2016, 10:14 AM

## 2016-10-29 ENCOUNTER — Telehealth: Payer: Self-pay | Admitting: Gynecologic Oncology

## 2016-10-29 NOTE — Telephone Encounter (Signed)
Attempted to call patient with biopsy results.  Per Dr. Lunette Stands last note, plan would be for a D&C if a polyp was found on biopsy.  Left message asking her to please call the office.

## 2016-10-30 ENCOUNTER — Telehealth: Payer: Self-pay

## 2016-10-30 ENCOUNTER — Encounter: Payer: Self-pay | Admitting: Pulmonary Disease

## 2016-10-30 NOTE — Telephone Encounter (Signed)
Told Ms Daughety that the endometrial polyp was negative for atypia or malignancy.  Dr. Fermin Schwab recommends a formal D&C by her regular  GYN if she has one or be referred to one.   Ms Spiewak is sees Auma Thongtelum,NP with [facility].  Faxed office note and this letter to Nurse practitioner at patient's request. Patient to discuss gyn physician for [facility] with Nurse practitioner.

## 2016-10-30 NOTE — Telephone Encounter (Signed)
LM for Erin Roach to call back regarding the results of the endometrial biopsy from 10-23-16.

## 2016-10-30 NOTE — Telephone Encounter (Signed)
Pt is wanting to find out about the referral to the pain clinic.  The order was placed on 6/11 and the pt is sending in an email stating that she has not heard from them.  I looked on the referral but didn't see any type of notes stating that it has been done.  PCC's please advise. Thanks

## 2016-11-03 ENCOUNTER — Other Ambulatory Visit: Payer: Self-pay | Admitting: Internal Medicine

## 2016-11-03 MED ORDER — HYDROCODONE-ACETAMINOPHEN 5-325 MG PO TABS: 1.0000 | ORAL_TABLET | Freq: Every day | ORAL | 0 refills | Status: DC

## 2016-11-03 NOTE — Telephone Encounter (Signed)
Spoke with pt, informed RX is ready for pick-up.

## 2016-11-03 NOTE — Telephone Encounter (Signed)
Erin Roach is not comfortable with the OB/Gyn physicians in her current practice.  She only sees the Nurse Practitioner. Made referral to Dr. Helane Rima  For July  19-18 at 11 am to discuss D&C as recommended by Dr. Aldean Ast.

## 2016-11-03 NOTE — Telephone Encounter (Signed)
Pt would like a refill of HYDROcodone-acetaminophen (NORCO/VICODIN) 5-325 MG tablet  ° °

## 2016-11-03 NOTE — Telephone Encounter (Signed)
[place] Controlled Substance Database checked. Last filled on 10/04/16

## 2016-11-09 ENCOUNTER — Encounter: Payer: Self-pay | Admitting: Physical Medicine & Rehabilitation

## 2016-11-10 ENCOUNTER — Other Ambulatory Visit: Payer: Self-pay | Admitting: Internal Medicine

## 2016-11-28 NOTE — H&P (Signed)
76 year old female presents for removal of endometrial polyp. History of left ovarian dermoid which patient is asymptomatic.  Past Medical History:  Diagnosis Date  . Arthritis   . Asthma   . Carotid artery disease ([place])    a. Carotid duplex 03/2016 - duplex was stable, 37-85% RICA, 8-85% LICA  . Chest pain    a. normal coronaries 2010.  [name] Esophageal reflux   . Essential hypertension 09/12/2015  . Heart murmur   . Hyperlipidemia   . Hypertension   . Leg edema    bilateral  . LVH (left ventricular hypertrophy)    a.  2D echo 2010: EF 75-80%, hyperdynamic LV with near cavity oblieration, mild LVH, grade 1 DD.  [name] Obesity   . RLS (restless legs syndrome)   . Skin cancer    hx of squamous cell buttocks area, Yevette Edwards MD,Gen Surgery: Amy Martinique MD, derm  . Snoring    no sleep study  . Urinary incontinence   . Venous insufficiency    legs   Past Surgical History:  Procedure Laterality Date  . APPENDECTOMY    . COLONOSCOPY     neg; Dr Collene Mares  . CYSTOSCOPY  2004   ? negative, Dr Jeffie Pollock  . skin cancer removal     squamous cell interglureal space  . TONSILLECTOMY    . 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 Ulnar Nerve Surgery     04/20/13:  pt denies   Prior to Admission medications   Medication Sig Start Date End Date Taking? Authorizing Provider  furosemide (LASIX) 20 MG tablet TAKE ONE TABLET BY MOUTH DAILY 07/21/16   Binnie Rail, MD  HYDROcodone-acetaminophen (NORCO/VICODIN) 5-325 MG tablet Take 1 tablet by mouth at bedtime. 11/03/16   Binnie Rail, MD  levocetirizine (XYZAL) 5 MG tablet TAKE 1 TABLET BY MOUTH EVERY EVENING. 08/10/16   Burns, Claudina Lick, MD  losartan (COZAAR) 100 MG tablet TAKE 1 TABLET BY MOUTH DAILY. 09/09/16   Binnie Rail, MD  MYRBETRIQ 25 MG TB24 tablet TAKE TWO TABLETS BY MOUTH DAILY 08/10/16   Binnie Rail, MD   pramipexole (MIRAPEX) 0.5 MG tablet Take one tablet in afternoon and one in the evening 09/15/16   Burns, Claudina Lick, MD  pregabalin (LYRICA) 75 MG capsule TAKE 1 CAPSULE BY MOUTH IN AFTERNOON, AND 2 CAPSULES AT BEDTIME 06/05/16   Burns, Claudina Lick, MD  simvastatin (ZOCOR) 20 MG tablet TAKE 1 TABLET (20 MG TOTAL) BY MOUTH AT BEDTIME. 11/10/16   Burns, Claudina Lick, MD  simvastatin (ZOCOR) 40 MG tablet Take 1 tablet (40 mg total) by mouth at bedtime. 06/05/16   Binnie Rail, MD   Allergies None  Family History  Problem Relation Age of Onset  . Esophageal cancer Father   . Heart disease Father        Unknown what kind  . Hyperlipidemia Mother   . CAD Sister        6 years younger than patient - had stent. Was told that her anorexia/bulimia may have played a role  . Stroke Maternal Grandmother 66  . Diabetes Maternal Grandmother   . Heart attack Maternal Grandfather 93  . COPD Maternal Grandfather   . Heart attack Paternal Grandmother        39s  . Diabetes Unknown  [place]   Social History   Social History  . Marital status: Married    Spouse name: N/A  . Number of children: N/A  . Years of education: N/A   Occupational History  . retired Pharmacist, hospital    Social History Main Topics  . Smoking status: Former Smoker    Packs/day: 0.50    Years: 15.00    Types: Cigarettes  . Smokeless tobacco: Never Used     Comment: smoked age [age], up to 1 ppd  . Alcohol use 1.8 oz/week    3 Glasses of wine per week     Comment: socially  . Drug use: No  . Sexual activity: Not on file   Other Topics Concern  . Not on file   Social History Narrative   2 children ages 26,35   Regular exercise no   General alert and oriented Lung CTAB Car RRR Abdomen is soft and non tender Pelvic WNL  IMPRESSION: Endometrial Polyp  PLAN: Hysteroscopy D and C  Risks reviewed  Consent signed

## 2016-12-02 ENCOUNTER — Ambulatory Visit: Payer: Medicare Other | Admitting: Internal Medicine

## 2016-12-08 ENCOUNTER — Ambulatory Visit (INDEPENDENT_AMBULATORY_CARE_PROVIDER_SITE_OTHER): Payer: Medicare Other | Admitting: Internal Medicine

## 2016-12-08 ENCOUNTER — Encounter: Payer: Self-pay | Admitting: Internal Medicine

## 2016-12-08 ENCOUNTER — Other Ambulatory Visit (INDEPENDENT_AMBULATORY_CARE_PROVIDER_SITE_OTHER): Payer: Medicare Other

## 2016-12-08 VITALS — BP 138/70 | HR 95 | Temp 98.1°F | Resp 16 | Wt 218.0 lb

## 2016-12-08 DIAGNOSIS — G2581 Restless legs syndrome: Secondary | ICD-10-CM

## 2016-12-08 DIAGNOSIS — I872 Venous insufficiency (chronic) (peripheral): Secondary | ICD-10-CM

## 2016-12-08 DIAGNOSIS — E538 Deficiency of other specified B group vitamins: Secondary | ICD-10-CM | POA: Insufficient documentation

## 2016-12-08 DIAGNOSIS — E669 Obesity, unspecified: Secondary | ICD-10-CM

## 2016-12-08 DIAGNOSIS — I10 Essential (primary) hypertension: Secondary | ICD-10-CM

## 2016-12-08 DIAGNOSIS — E559 Vitamin D deficiency, unspecified: Secondary | ICD-10-CM | POA: Insufficient documentation

## 2016-12-08 DIAGNOSIS — R739 Hyperglycemia, unspecified: Secondary | ICD-10-CM

## 2016-12-08 LAB — HEMOGLOBIN A1C: Hgb A1c MFr Bld: 5.6 % (ref 4.6–6.5)

## 2016-12-08 LAB — COMPREHENSIVE METABOLIC PANEL
ALK PHOS: 78 U/L (ref 39–117)
ALT: 30 U/L (ref 0–35)
AST: 22 U/L (ref 0–37)
Albumin: 4 g/dL (ref 3.5–5.2)
BUN: 15 mg/dL (ref 6–23)
CHLORIDE: 105 meq/L (ref 96–112)
CO2: 26 meq/L (ref 19–32)
Calcium: 8.7 mg/dL (ref 8.4–10.5)
Creatinine, Ser: 0.77 mg/dL (ref 0.40–1.20)
GFR: 77.9 mL/min (ref >60.00)
GLUCOSE: 147 mg/dL — AB (ref 70–99)
POTASSIUM: 4 meq/L (ref 3.5–5.1)
SODIUM: 139 meq/L (ref 135–145)
TOTAL PROTEIN: 6.8 g/dL (ref 6.0–8.3)
Total Bilirubin: 0.4 mg/dL (ref 0.2–1.2)

## 2016-12-08 LAB — VITAMIN D 25 HYDROXY (VIT D DEFICIENCY, FRACTURES): VITD: 25.31 ng/mL — AB (ref 30.00–100.00)

## 2016-12-08 LAB — VITAMIN B12: Vitamin B-12: 360 pg/mL (ref 211–911)

## 2016-12-08 MED ORDER — HYDROCODONE-ACETAMINOPHEN 5-325 MG PO TABS: 1.0000 | ORAL_TABLET | Freq: Every day | ORAL | 0 refills | Status: DC

## 2016-12-08 NOTE — Patient Instructions (Addendum)
  Test(s) ordered today. Your results will be released to [facility] (or called to you) after review, usually within 72hours after test completion. If any changes need to be made, you will be notified at that same time.   Medications reviewed and updated.  Changes include decreasing lyrica to 75 mg twice daily.   Dr Dennard Nip - weight management clinic with [facility]    Please followup in 6 months

## 2016-12-08 NOTE — Progress Notes (Signed)
Subjective:    Patient ID: Erin Roach, female    DOB: 04/08/1941, 76 y.o.   MRN: [id number]  HPI The patient is here for follow up.  Leg edema, peripheral insufficiency:  Her leg edema has gotten worse.  Her gyn, dr Helane Rima increased her lasix to 40 mg daily.  It has helped a little.  She is trying to avoid all sodium.  She had a nuclear stress test in 03/2016 and was EF was normal.  She wears compression socks most of the time, but less in the summer.  Hypertension: She is taking her medication daily. She is compliant with a low sodium diet.  She denies chest pain, palpitations, shortness of breath and regular headaches. She is exercising regularly.  She does not monitor her blood pressure at home.    Hyperlipidemia: She is taking her medication daily. She is compliant with a low fat/cholesterol diet. She is exercising regularly. She denies myalgias.   RLS:  She has been on multiple medications in the past.  She has seen neurology and pulmonary.  Her symptoms vary day to day and her symptoms can include a restless feeling or achiness.  She is unsure how much her current medications are helping.    Medications and allergies reviewed with patient and updated if appropriate.  Patient Active Problem List   Diagnosis Date Noted  . Tubular adenoma of colon 04/29/2016  . Essential hypertension 09/12/2015  . Allergic rhinitis 07/17/2015  . Insomnia 07/17/2015  . Obese 04/10/2015  . S/P left TKA 04/09/2015  . Hyperglycemia 01/09/2015  . Osteopenia 12/04/2012  . 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 RESTLESS LEG SYNDROME 02/11/2009  . LEG EDEMA, BILATERAL 02/11/2009  . Hyperlipidemia 10/30/2008  . Urinary incontinence 10/30/2008  . Skin cancer 10/30/2008  . Asthma 11/21/2007  . Venous (peripheral) insufficiency 11/22/2006  . SNORING 11/22/2006    Current Outpatient Prescriptions on File Prior to Visit  Medication Sig Dispense Refill  . furosemide (LASIX) 20 MG tablet TAKE ONE TABLET BY  MOUTH DAILY (Patient taking differently: TAKE TWO TABLET BY MOUTH DAILY) 90 tablet 3  . HYDROcodone-acetaminophen (NORCO/VICODIN) 5-325 MG tablet Take 1 tablet by mouth at bedtime. 30 tablet 0  . levocetirizine (XYZAL) 5 MG tablet TAKE 1 TABLET BY MOUTH EVERY EVENING. 90 tablet 3  . losartan (COZAAR) 100 MG tablet TAKE 1 TABLET BY MOUTH DAILY. 90 tablet 2  . MYRBETRIQ 25 MG TB24 tablet TAKE TWO TABLETS BY MOUTH DAILY 180 tablet 1  . pramipexole (MIRAPEX) 0.5 MG tablet Take one tablet in afternoon and one in the evening 180 tablet 1  . pregabalin (LYRICA) 75 MG capsule TAKE 1 CAPSULE BY MOUTH IN AFTERNOON, AND 2 CAPSULES AT BEDTIME 270 capsule 1  . simvastatin (ZOCOR) 40 MG tablet Take 1 tablet (40 mg total) by mouth at bedtime. 90 tablet 3  . [DISCONTINUED] VESICARE 10 MG tablet TAKE 1 TABLET EVERY DAY 30 tablet 0   No current facility-administered medications on file prior to visit.     Past Medical History:  Diagnosis Date  . Arthritis   . Asthma   . Carotid artery disease ([place])    a. Carotid duplex 03/2016 - duplex was stable, 78-58% RICA, 8-50% LICA  . Chest pain    a. normal coronaries 2010.  [name] Esophageal reflux   . Essential hypertension 09/12/2015  . Heart murmur   . Hyperlipidemia   . Hypertension   . Leg edema    bilateral  . LVH (  left ventricular hypertrophy)    a.  2D echo 2010: EF 75-80%, hyperdynamic LV with near cavity oblieration, mild LVH, grade 1 DD.  [name] Obesity   . RLS (restless legs syndrome)   . Skin cancer    hx of squamous cell buttocks area, Yevette Edwards MD,Gen Surgery: Amy Martinique MD, derm  . Snoring    no sleep study  . Urinary incontinence   . Venous insufficiency    legs    Past Surgical History:  Procedure Laterality Date  . APPENDECTOMY    . COLONOSCOPY     neg; Dr Collene Mares  . CYSTOSCOPY  2004   ? negative, Dr Jeffie Pollock  . skin cancer removal     squamous cell interglureal space  . TONSILLECTOMY    . TOTAL KNEE ARTHROPLASTY Left 04/09/2015    Procedure: LEFT TOTAL KNEE ARTHROPLASTY;  Surgeon: Paralee Cancel, MD;  Location: [facility];  Service: Orthopedics;  Laterality: Left;  . Ulnar Nerve Surgery     04/20/13:  pt denies    Social History   Social History  . Marital status: Married    Spouse name: N/A  . Number of children: N/A  . Years of education: N/A   Occupational History  . retired Pharmacist, hospital    Social History Main Topics  . Smoking status: Former Smoker    Packs/day: 0.50    Years: 15.00    Types: Cigarettes  . Smokeless tobacco: Never Used     Comment: smoked age [age], up to 1 ppd  . Alcohol use 1.8 oz/week    3 Glasses of wine per week     Comment: socially  . Drug use: No  . Sexual activity: Not on file   Other Topics Concern  . Not on file   Social History Narrative   2 children ages 28,35   Regular exercise no    Family History  Problem Relation Age of Onset  . Esophageal cancer Father   . Heart disease Father        Unknown what kind  . Hyperlipidemia Mother   . CAD Sister        52 years younger than patient - had stent. Was told that her anorexia/bulimia may have played a role  . Stroke Maternal Grandmother 66  . Diabetes Maternal Grandmother   . Heart attack Maternal Grandfather 93  . COPD Maternal Grandfather   . Heart attack Paternal Grandmother        79s  . Diabetes Unknown        [facility]    Review of Systems  Constitutional: Negative for chills and fever.  Respiratory: Positive for cough (dry). Negative for shortness of breath and wheezing.   Cardiovascular: Positive for leg swelling. Negative for chest pain and palpitations.  Neurological: Negative for light-headedness, numbness and headaches.       Objective:   Vitals:   12/08/16 1359  BP: 138/70  Pulse: 95  Resp: 16  Temp: 98.1 F (36.7 C)   Wt Readings from Last 3 Encounters:  12/08/16 218 lb (98.9 kg)  10/23/16 216 lb 9.6 oz (98.2 kg)  09/15/16 218 lb (98.9 kg)   Body mass index is 34.66 kg/m.   Physical  Exam    Constitutional: Appears well-developed and well-nourished. No distress.  HENT:  Head: Normocephalic and atraumatic.  Neck: Neck supple. No tracheal deviation present. No thyromegaly present.  No cervical lymphadenopathy Cardiovascular: Normal rate, regular rhythm and normal heart sounds.   No murmur heard.  No carotid bruit .  1+ pitting edema b/l LE Pulmonary/Chest: Effort normal and breath sounds normal. No respiratory distress. No has no wheezes. No rales.  Skin: Skin is warm and dry. Not diaphoretic.  Psychiatric: Normal mood and affect. Behavior is normal.      Assessment & Plan:    See Problem List for Assessment and Plan of chronic medical problems.

## 2016-12-08 NOTE — Assessment & Plan Note (Signed)
Ck b12 level

## 2016-12-08 NOTE — Assessment & Plan Note (Signed)
Check vitamin d level

## 2016-12-08 NOTE — Assessment & Plan Note (Addendum)
Wears compression socks most of the time, but not as much in the summer Taking lasix 40 mg daily - will check cmp  Likely related lyrica and venous insufficiency Will try decreasing lyrica - if it does not help with her RLS will taper off or at least try to get her on the lowest dose Low sodium diet

## 2016-12-08 NOTE — Assessment & Plan Note (Signed)
BP well controlled Current regimen effective and well tolerated Continue current medications at current doses cmp  

## 2016-12-08 NOTE — Assessment & Plan Note (Addendum)
Continue regular exercise Decrease portions Will refer to Dr Dennard Nip

## 2016-12-08 NOTE — Assessment & Plan Note (Signed)
Check a1c Continue regular exercise Low sugar/carb diet

## 2016-12-08 NOTE — Assessment & Plan Note (Addendum)
Not controlled Taking mirapex, lyrica, vicodin lyrica likely causing weight gain and edema - will try decreasing to 2 tabs daily - if not helping RLS will taper off or at least get her on the lowest dose Has appt to see pain management

## 2016-12-09 ENCOUNTER — Encounter: Payer: Self-pay | Admitting: Internal Medicine

## 2016-12-09 ENCOUNTER — Encounter: Payer: Self-pay | Admitting: Physical Therapy

## 2016-12-09 ENCOUNTER — Encounter (HOSPITAL_BASED_OUTPATIENT_CLINIC_OR_DEPARTMENT_OTHER): Payer: Self-pay | Admitting: *Deleted

## 2016-12-09 ENCOUNTER — Ambulatory Visit: Payer: Medicare Other | Attending: Orthopaedic Surgery | Admitting: Physical Therapy

## 2016-12-09 DIAGNOSIS — M62838 Other muscle spasm: Secondary | ICD-10-CM | POA: Insufficient documentation

## 2016-12-09 DIAGNOSIS — M25562 Pain in left knee: Secondary | ICD-10-CM | POA: Diagnosis not present

## 2016-12-09 DIAGNOSIS — M545 Low back pain: Secondary | ICD-10-CM | POA: Insufficient documentation

## 2016-12-09 DIAGNOSIS — M6281 Muscle weakness (generalized): Secondary | ICD-10-CM

## 2016-12-09 DIAGNOSIS — R262 Difficulty in walking, not elsewhere classified: Secondary | ICD-10-CM | POA: Diagnosis present

## 2016-12-09 DIAGNOSIS — G8929 Other chronic pain: Secondary | ICD-10-CM | POA: Diagnosis present

## 2016-12-09 NOTE — Patient Instructions (Signed)
Long [employer]    Straighten operated leg and try to hold it __3__ seconds. Lower SLOWLY, Use ____ lbs on ankle. Repeat __20__ times. Do _1___ sessions a day.  http://gt2.exer.us/311   Copyright  VHI. All rights reserved.     Standing bent knee quad stretch  With chair in back of you, bend the knee and place the foot of the affected leg on the seat of a chair. If no stretch is felt, bend down with the other knee until stretch is felt in the front of the quad. Hold 30 sec repeat 3x

## 2016-12-09 NOTE — Therapy (Signed)
[facility] [address], [place], [place], [address] Phone: [phone]   Fax:  [phone]  Physical Therapy Evaluation  Patient Details  Name: Erin Roach MRN: [id number] Date of Birth: [birth year] Referring Provider: Paralee Cancel  Encounter Date: 12/09/2016      PT End of Session - 12/09/16 1529    Visit Number 1   Number of Visits 10   Date for PT Re-Evaluation 03/03/17   Authorization Type gcodes at 10th visit; KX at 24   PT Start Time 1529   PT Stop Time 1610   PT Time Calculation (min) 41 min   Activity Tolerance Patient tolerated treatment well   Behavior During Therapy [facility] for tasks assessed/performed      Past Medical History:  Diagnosis Date  . Allergic rhinitis   . Arthritis   . Asthma   . Bilateral lower extremity edema   . Carotid artery disease ([place])    a. Carotid duplex 03/2016 - duplex was stable, 01-02% RICA, 7-25% LICA  . Endometrial polyp   . Esophageal reflux   . Essential hypertension   . History of adenomatous polyp of colon    04-24-2016  tubular adenoma  . History of squamous cell carcinoma excision    pilonidal area second excision 02-02-2002  . Hyperlipidemia   . OAB (overactive bladder)   . RLS (restless legs syndrome)   . Urinary incontinence   . Venous insufficiency    legs    Past Surgical History:  Procedure Laterality Date  . APPENDECTOMY  07-02-2005   [name]   open  . CARDIAC CATHETERIZATION  0915-2010  dr Darnell Level brodie   normal coronary arteries and lvf  . CARDIOVASCULAR STRESS TEST  03/31/2016   Low risk nuclear study w/ medium defect of mild severity in basal anterior and mid anterior location w/ no evidence ischemia or infarction/  normal LV function and wall motion,  nuclear stress ef 71%  . COLONOSCOPY  last one 04-24-2016  . TONSILLECTOMY    . 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 TRANSTHORACIC ECHOCARDIOGRAM  01/21/2009   mild LVH, ef 36-64%, grade 1 diastolic dysfunction  . ULNAR NERVE TRANSPOSITION Left 12-20-2008  dr sypher   decompression and partial resection medial triceps fascia  . WIDE LOCAL EXCISION PILONIDAL AREA  02-02-2002  [name]   recurrent squamous cell carcinoma in situ    There were no vitals filed for this visit.       Subjective Assessment - 12/09/16 1540    Subjective The knee has just been hurting and I have noticed it all the time.  My back still hurts but if I can only work on one thing, my knee is worse.     Pertinent History back surgery, knee replacement   Limitations Walking   Patient Stated Goals be able to take hour walks and be more active   Currently in Pain? Yes   Pain Score 6   worst after working out   Pain Location Knee   Pain Orientation Left;Anterior  the most at patellar tendon attachment on tibia   Pain Descriptors / Indicators Sore   Pain Type Chronic pain   Pain Onset More than a month ago   Pain Frequency Intermittent   Aggravating Factors  bending and getting down on the floor, stiff after standing  Pain Relieving Factors rest   Effect of Pain on Daily Activities be able to walk further and getting up and down from the floor   Multiple Pain Sites No            [facility] PT Assessment - 12/09/16 0001      Assessment   Medical Diagnosis M25.562,G89.29,Z96.652 (ICD-10-CM) - Chronic knee pain after total replacement of left knee joint   Referring Provider OLIN, [name]   Onset Date/Surgical Date --  about one year   Next MD Visit 2 months   Prior Therapy yes     Precautions   Precautions None     Restrictions   Weight Bearing Restrictions No     Balance Screen   Has the patient fallen in the past 6 months No     [place] residence     Prior Function   Level of Independence  Independent     Cognition   Overall Cognitive Status Within Functional Limits for tasks assessed     Observation/Other Assessments-Edema    Edema Circumferential     Circumferential Edema   Circumferential - Left  knee 50 cm around patella     Posture/Postural Control   Posture/Postural Control Postural limitations   Postural Limitations Rounded Shoulders;Anterior pelvic tilt     AROM   Overall AROM  Deficits   Overall AROM Comments right knee: 118 deg flex, 0 ext; Left knee 115 deg flex, -4 deg extension     Strength   Overall Strength Comments Right LE 5/5; Left LE 4/5 throughout     Flexibility   Soft Tissue Assessment /Muscle Length yes   Hamstrings 25% limited   Quadriceps 25% limited   ITB 25% limited   Piriformis 25% limited  (quads)     Palpation   Patella mobility tight superior/inferior, med/lateral stiffness   Palpation comment patellar tendon sore with mild pitting edema around knee,      Ambulation/Gait   Gait Pattern --  decreased hip extension Left>right            Objective measurements completed on examination: See above findings.          [facility] Adult PT Treatment/Exercise - 12/09/16 0001      Knee/Hip Exercises: Stretches   Quad Stretch Left;3 reps;30 seconds     Knee/Hip Exercises: Seated   Long Arc Quad Strengthening;Left;20 reps  slow lowering                PT Education - 12/09/16 1610    Education provided Yes   Education Details quad stretch using chair standing, LAQ with eccentric lowering   Person(s) Educated Patient   Methods Explanation;Demonstration;Verbal cues;Handout   Comprehension Verbalized understanding;Returned demonstration          PT Short Term Goals - 12/09/16 1631      PT SHORT TERM GOAL #1   Title independent with initial HEP   Time 4   Period Weeks   Status New     PT SHORT TERM GOAL #2   Title pt will report knee pain 4/10 at most due to improved soft tissue length   Time 4    Period Weeks   Status New     PT SHORT TERM GOAL #3   Title pt will be able to tolerate 15 minute walk without needing to sit down due to pain for reintroduction to walking routine   Time 4   Period Weeks  Status New     PT SHORT TERM GOAL #4   Title MMT 4+/5 for left hip abduction and external rotation for improved functional movements   Time 4   Period Weeks   Status New           PT Long Term Goals - 23-Dec-2016 1633      PT LONG TERM GOAL #1   Title independent with advanced HEP   Time 8   Period Weeks   Status New     PT LONG TERM GOAL #2   Title able to take 30 min walk up to 2x/day with tolerable amount of pain for return to normal activity   Baseline .[name]   Time 8   Period Weeks   Status New     PT LONG TERM GOAL #3   Title knee pain reduced by 50% during the most aggravating activity for improved function   Baseline ...   Time 8   Period Weeks   Status New     PT LONG TERM GOAL #4   Title able to manage pain with movement and stretches when getting up after sitting still for long periods of time   Baseline ...   Time 8   Period Weeks   Status New     PT LONG TERM GOAL #5   Title ....                Plan - 2016-12-23 1621    Clinical Impression Statement Patient presents to clinic with back and knee pain but reports she would like to focus more on the knee.  She has had pain since knee surgery over a year ago and she felt it would be better at this time.  Pt has slightly decreased knee ROM on left, weakness of hip, knee and ankle on left LE.  Pt is generally active and walks a lot.  Currently has some swelling in left knee which gets worse occasionally.  She has pain up to 6/10 with too much activity.  Pt ambulates with decreased hip extension left>right.  Demonstrates tight hamstrings, quads, IT band, adducotrs, and hip flexors.  Pt will benefit from skilled PT to address impairments and returned to maximum function.   History and Personal Factors  relevant to plan of care: left TKR, back surgery, history of chronic pain   Clinical Presentation Stable   Clinical Presentation due to: not worsening   Clinical Decision Making Low   Rehab Potential Excellent   PT Frequency 2x / week   PT Duration 8 weeks   PT Treatment/Interventions ADLs/Self Care Home Management;Biofeedback;Cryotherapy;Moist Heat;Gait training;Stair training;Functional mobility training;Electrical Stimulation;Therapeutic activities;Therapeutic exercise;Balance training;Neuromuscular re-education;Patient/family education;Manual techniques;Dry needling;Taping;Iontophoresis 4mg /ml Dexamethasone;Passive range of motion   PT Next Visit Plan hip and knee stretches and ROM, quad strengtheing, patella mobs, pain relief   Consulted and Agree with Plan of Care Patient      Patient will benefit from skilled therapeutic intervention in order to improve the following deficits and impairments:  Abnormal gait, Decreased coordination, Decreased endurance, Decreased range of motion, Difficulty walking, Decreased strength, Pain, Impaired flexibility, Postural dysfunction  Visit Diagnosis: Chronic pain of left knee  Muscle weakness (generalized)  Difficulty in walking, not elsewhere classified      G-Codes - 12/23/2016 1639    Functional Assessment Tool Used (Outpatient Only) FOTO and clinical reasoning   Functional Limitation Mobility: Walking and moving around   Mobility: Walking and Moving Around Current Status ([id number]) At least 40 percent  but less than 60 percent impaired, limited or restricted   Mobility: Walking and Moving Around Goal Status [phone]) At least 40 percent but less than 60 percent impaired, limited or restricted       Problem List Patient Active Problem List   Diagnosis Date Noted  . Vitamin D deficiency 12/08/2016  . B12 deficiency 12/08/2016  . Tubular adenoma of colon 04/29/2016  . Essential hypertension 09/12/2015  . Allergic rhinitis 07/17/2015  .  Insomnia 07/17/2015  . Obese 04/10/2015  . S/P left TKA 04/09/2015  . Hyperglycemia 01/09/2015  . Osteopenia 12/04/2012  . 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 RESTLESS LEG SYNDROME 02/11/2009  . LEG EDEMA, BILATERAL 02/11/2009  . Hyperlipidemia 10/30/2008  . Urinary incontinence 10/30/2008  . Skin cancer 10/30/2008  . Asthma 11/21/2007  . Venous (peripheral) insufficiency 11/22/2006  . SNORING 11/22/2006    Zannie Cove, PT 12/09/2016, 4:44 PM  [facility] [address], [place], [place], [address] Phone: [phone]   Fax:  [phone]  Name: LAQUEISHA CATALINA MRN: [id number] Date of Birth: January 01, 1941

## 2016-12-10 ENCOUNTER — Encounter (HOSPITAL_BASED_OUTPATIENT_CLINIC_OR_DEPARTMENT_OTHER): Payer: Self-pay | Admitting: *Deleted

## 2016-12-10 ENCOUNTER — Encounter: Payer: Medicare Other | Attending: Physical Medicine & Rehabilitation | Admitting: Physical Medicine & Rehabilitation

## 2016-12-10 ENCOUNTER — Encounter: Payer: Self-pay | Admitting: Physical Medicine & Rehabilitation

## 2016-12-10 VITALS — BP 157/74 | HR 90

## 2016-12-10 DIAGNOSIS — G2581 Restless legs syndrome: Secondary | ICD-10-CM

## 2016-12-10 DIAGNOSIS — E785 Hyperlipidemia, unspecified: Secondary | ICD-10-CM | POA: Insufficient documentation

## 2016-12-10 DIAGNOSIS — M7989 Other specified soft tissue disorders: Secondary | ICD-10-CM | POA: Diagnosis not present

## 2016-12-10 DIAGNOSIS — E559 Vitamin D deficiency, unspecified: Secondary | ICD-10-CM

## 2016-12-10 DIAGNOSIS — G479 Sleep disorder, unspecified: Secondary | ICD-10-CM | POA: Diagnosis not present

## 2016-12-10 DIAGNOSIS — Q666 Other congenital valgus deformities of feet: Secondary | ICD-10-CM

## 2016-12-10 DIAGNOSIS — Z87891 Personal history of nicotine dependence: Secondary | ICD-10-CM | POA: Diagnosis not present

## 2016-12-10 DIAGNOSIS — Z8249 Family history of ischemic heart disease and other diseases of the circulatory system: Secondary | ICD-10-CM | POA: Insufficient documentation

## 2016-12-10 DIAGNOSIS — I2510 Atherosclerotic heart disease of native coronary artery without angina pectoris: Secondary | ICD-10-CM | POA: Diagnosis not present

## 2016-12-10 DIAGNOSIS — M79605 Pain in left leg: Secondary | ICD-10-CM | POA: Diagnosis not present

## 2016-12-10 DIAGNOSIS — N840 Polyp of corpus uteri: Secondary | ICD-10-CM | POA: Diagnosis not present

## 2016-12-10 DIAGNOSIS — Z96652 Presence of left artificial knee joint: Secondary | ICD-10-CM | POA: Insufficient documentation

## 2016-12-10 DIAGNOSIS — M79604 Pain in right leg: Secondary | ICD-10-CM | POA: Diagnosis not present

## 2016-12-10 DIAGNOSIS — Z981 Arthrodesis status: Secondary | ICD-10-CM | POA: Diagnosis not present

## 2016-12-10 DIAGNOSIS — M4800 Spinal stenosis, site unspecified: Secondary | ICD-10-CM | POA: Insufficient documentation

## 2016-12-10 DIAGNOSIS — F329 Major depressive disorder, single episode, unspecified: Secondary | ICD-10-CM | POA: Diagnosis not present

## 2016-12-10 DIAGNOSIS — J45909 Unspecified asthma, uncomplicated: Secondary | ICD-10-CM | POA: Insufficient documentation

## 2016-12-10 DIAGNOSIS — K219 Gastro-esophageal reflux disease without esophagitis: Secondary | ICD-10-CM | POA: Diagnosis not present

## 2016-12-10 DIAGNOSIS — I10 Essential (primary) hypertension: Secondary | ICD-10-CM | POA: Diagnosis not present

## 2016-12-10 DIAGNOSIS — N3281 Overactive bladder: Secondary | ICD-10-CM | POA: Insufficient documentation

## 2016-12-10 DIAGNOSIS — I872 Venous insufficiency (chronic) (peripheral): Secondary | ICD-10-CM | POA: Insufficient documentation

## 2016-12-10 DIAGNOSIS — M545 Low back pain: Secondary | ICD-10-CM | POA: Diagnosis present

## 2016-12-10 DIAGNOSIS — R269 Unspecified abnormalities of gait and mobility: Secondary | ICD-10-CM

## 2016-12-10 MED ORDER — VITAMIN B-12 1000 MCG PO TABS: 500.0000 ug | ORAL_TABLET | Freq: Every day | ORAL | 1 refills | Status: DC

## 2016-12-10 MED ORDER — AMITRIPTYLINE HCL 10 MG PO TABS: 10.0000 mg | ORAL_TABLET | Freq: Every day | ORAL | 1 refills | Status: DC

## 2016-12-10 MED ORDER — DICLOFENAC SODIUM 1 % TD GEL: 2.0000 g | Freq: Four times a day (QID) | TRANSDERMAL | 1 refills | Status: DC

## 2016-12-10 NOTE — Patient Instructions (Signed)
Please take iron supplement daily with vitamin C supplementation

## 2016-12-10 NOTE — Progress Notes (Signed)
Subjective:    Patient ID: Erin Roach, female    DOB: [birth year], 76 y.o.   MRN: [id number]  HPI 33 female with pmh/psh of HTN, RLS, GERD, low back pain, spinal stenosis, CAD, asthma, left TKA, lumbar fusion 2018 presents for evaluation of RLS.  Started in ~1980s.  Getting more progressive and consistent.  Usually in back of knee.  Denies associated symptoms, except poor sleep.  Denies falls. She saw Neurology and RLE specialist.  She was told she had one of the worst cases.  She tried Relaxis with good benefit, but it stopped working.  She has tried several things over the years with short term benefit.    Pain Inventory Average Pain 6 Pain Right Now 8 My pain is intermittent and burning  In the last 24 hours, has pain interfered with the following? General activity 0 Relation with others 0 Enjoyment of life 9 What TIME of day is your pain at its worst? night Sleep (in general) Poor  Pain is worse with: unsure Pain improves with: medication Relief from Meds: 6  Mobility walk without assistance ability to climb steps?  yes do you drive?  yes  Function retired  Neuro/Psych depression  Prior Studies Any changes since last visit?  no  Physicians involved in your care Any changes since last visit?  no   Family History  Problem Relation Age of Onset  . Esophageal cancer Father   . Heart disease Father        Unknown what kind  . Hyperlipidemia Mother   . CAD Sister        33 years younger than patient - had stent. Was told that her anorexia/bulimia may have played a role  . Stroke Maternal Grandmother 66  . Diabetes Maternal Grandmother   . Heart attack Maternal Grandfather 93  . COPD Maternal Grandfather   . Heart attack Paternal Grandmother        34s  . Diabetes Unknown        MGGM   Social History   Social History  . Marital status: Married    Spouse name: N/A  . Number of children: N/A  . Years of education: N/A   Occupational History  .  retired Pharmacist, hospital    Social History Main Topics  . Smoking status: Former Smoker    Packs/day: 0.50    Years: 15.00    Types: Cigarettes  . Smokeless tobacco: Never Used     Comment: smoked age [age], up to 1 ppd  . Alcohol use 1.8 oz/week    3 Glasses of wine per week     Comment: socially  . Drug use: No  . Sexual activity: Not Asked   Other Topics Concern  . None   Social History Narrative   2 children ages 3,35   Regular exercise no   Past Surgical History:  Procedure Laterality Date  . APPENDECTOMY  07-02-2005   [name]   open  . CARDIAC CATHETERIZATION  0915-2010  dr Darnell Level brodie   normal coronary arteries and lvf  . CARDIOVASCULAR STRESS TEST  03/31/2016   Low risk nuclear study w/ medium defect of mild severity in basal anterior and mid anterior location w/ no evidence ischemia or infarction/  normal LV function and wall motion,  nuclear stress ef 71%  . COLONOSCOPY  last one 04-24-2016  . TONSILLECTOMY    . TOTAL KNEE ARTHROPLASTY Left 04/09/2015   Procedure: LEFT TOTAL KNEE ARTHROPLASTY;  Surgeon: Rodman Key  Alvan Dame, MD;  Location: [facility];  Service: Orthopedics;  Laterality: Left;  . TRANSTHORACIC ECHOCARDIOGRAM  01/21/2009   mild LVH, ef 38-25%, grade 1 diastolic dysfunction  . ULNAR NERVE TRANSPOSITION Left 12-20-2008  dr sypher   decompression and partial resection medial triceps fascia  . WIDE LOCAL EXCISION PILONIDAL AREA  02-02-2002  [name]   recurrent squamous cell carcinoma in situ   Past Medical History:  Diagnosis Date  . Allergic rhinitis   . Arthritis   . Asthma   . Bilateral lower extremity edema   . Carotid artery disease ([place])    a. Carotid duplex 03/2016 - duplex was stable, 05-39% RICA, 7-67% LICA  . Endometrial polyp   . Esophageal reflux   . Essential hypertension   . History of adenomatous polyp of colon    04-24-2016  tubular adenoma  . History of squamous cell carcinoma excision    pilonidal area second excision 02-02-2002  .  Hyperlipidemia   . OAB (overactive bladder)   . RLS (restless legs syndrome)   . Urinary incontinence   . Venous insufficiency    legs   BP (!) 157/74   Pulse 90   SpO2 97%   Opioid Risk Score:   Fall Risk Score:  `1  Depression screen PHQ 2/9  Depression screen [facility] 2/9 12/10/2016 06/03/2016 03/21/2015  Decreased Interest 2 0 0  Down, Depressed, Hopeless 1 0 0  PHQ - 2 Score 3 0 0  Altered sleeping 3 - -  Tired, decreased energy 3 - -  Change in appetite 3 - -  Feeling bad or failure about yourself  2 - -  Trouble concentrating 2 - -  Moving slowly or fidgety/restless 1 - -  Suicidal thoughts 1 - -  PHQ-9 Score 18 - -  Difficult doing work/chores Very difficult - -  Some recent data might be hidden     Review of Systems  Constitutional: Positive for unexpected weight change.  HENT: Negative.   Eyes: Negative.   Respiratory: Negative.   Cardiovascular: Negative.   Gastrointestinal: Negative.   Endocrine: Negative.   Genitourinary: Negative.   Musculoskeletal: Negative.   Skin: Negative.   Allergic/Immunologic: Negative.   Neurological: Negative.        RLS  Hematological: Negative.   Psychiatric/Behavioral: Negative.   All other systems reviewed and are negative.      Objective:   Physical Exam Gen: NAD. Vital signs reviewed HENT: Normocephalic, Atraumatic Eyes: EOMI. No discharge.  Cardio: RRR. No JVD. Pulm: B/l clear to auscultation.  Effort normal Abd: Soft, BS+ MSK:  Gait slightly antalgic.   No TTP   +edema.   Pes valgus Neuro:  Sensation intact to light touch in all LE dermatomes  Strength      5/5 in all LE myotomes Skin: Warm and Dry. Intact    Assessment & Plan:  51 female with pmh/psh of HTN, RLS, GERD, low back pain, spinal stenosis, CAD, asthma, left TKA, lumbar fusion 2018 presents for evaluation of RLS.  1. Severe RLS with pain in b/l feet  No imagine on file   Labs reviewed  Referral information reviewed  [place] reviewed  TENS  unit, Lidoderm patch does not provide benefit  Cont Heat  Will order Voltaren gel  Will order Vit B12  Will order FeSo4 with Vitamin C  Will consider Cymbalta 30mg  daily with food  Will consider Accupuncture in future  Will consider Gabapentin 300 TID  Will consider SCS, baths, compound  cream  Norco, Lyrica prescribed by PCP  2. Gait abnormality  Will order Orthotics  3. Sleep disturbance  Will order Elavil 10mg  daily   4. Morbid Obesity  Will refer to dietitian   5. Vitamin D deficiency  Cont supplement

## 2016-12-10 NOTE — Progress Notes (Signed)
NPO AFTER MN.  ARRIVE AT 0600.  GETTING CBC DONE PRIOR TO DOS.  CURRENT CMET RESULT AND EKG IN CHART AND EPIC.  WILL TAKE NEXIUM AM DOS W/ SIP OF WATER.

## 2016-12-11 ENCOUNTER — Telehealth: Payer: Self-pay

## 2016-12-11 ENCOUNTER — Encounter: Payer: Self-pay | Admitting: Physical Therapy

## 2016-12-11 ENCOUNTER — Ambulatory Visit: Payer: Medicare Other | Admitting: Physical Therapy

## 2016-12-11 DIAGNOSIS — G8929 Other chronic pain: Secondary | ICD-10-CM

## 2016-12-11 DIAGNOSIS — M25562 Pain in left knee: Principal | ICD-10-CM

## 2016-12-11 DIAGNOSIS — R262 Difficulty in walking, not elsewhere classified: Secondary | ICD-10-CM

## 2016-12-11 DIAGNOSIS — M6281 Muscle weakness (generalized): Secondary | ICD-10-CM

## 2016-12-11 DIAGNOSIS — M545 Low back pain: Secondary | ICD-10-CM

## 2016-12-11 DIAGNOSIS — M62838 Other muscle spasm: Secondary | ICD-10-CM

## 2016-12-11 NOTE — Therapy (Signed)
[facility] [address], [place], [place], [address] Phone: [phone]   Fax:  [phone]  Physical Therapy Treatment  Patient Details  Name: Erin Roach MRN: [id number] Date of Birth: 1941-01-03 Referring Provider: Paralee Cancel  Encounter Date: 12/11/2016      PT End of Session - 12/11/16 1009    Visit Number 2   Number of Visits 10   Date for PT Re-Evaluation 03/03/17   Authorization Type gcodes at 10th visit; KX at 91   PT Start Time 0930   PT Stop Time 1020   PT Time Calculation (min) 50 min   Activity Tolerance Patient tolerated treatment well   Behavior During Therapy [facility] for tasks assessed/performed      Past Medical History:  Diagnosis Date  . Allergic rhinitis   . Arthritis   . Asthma    very mild  . Bilateral lower extremity edema   . Carotid artery disease ([place])    a. Carotid duplex 03/2016 - duplex was stable, 01-00% RICA, 7-12% LICA  . Complication of anesthesia    post-op delirium  . Endometrial polyp   . Essential hypertension   . GERD (gastroesophageal reflux disease)   . Heart murmur    per pt  . History of adenomatous polyp of colon    04-24-2016  tubular adenoma  . History of squamous cell carcinoma excision    pilonidal area second excision 02-02-2002  . Hyperlipidemia   . OAB (overactive bladder)   . RLS (restless legs syndrome)   . SUI (stress urinary incontinence, female)   . Venous insufficiency    legs    Past Surgical History:  Procedure Laterality Date  . APPENDECTOMY  07-02-2005   [name]   open  . CARDIAC CATHETERIZATION  01-23-2009  dr Darnell Level brodie   normal coronary arteries and lvf  . CARDIOVASCULAR STRESS TEST  03/31/2016   Low risk nuclear study w/ medium defect of mild severity in basal anterior and mid anterior location w/ no evidence ischemia or infarction/  normal LV function and wall motion,  nuclear stress ef 71%  . CATARACT EXTRACTION W/  INTRAOCULAR LENS IMPLANT Left 09/2016  . COLONOSCOPY  last one 04-24-2016  . POSTERIOR LUMBAR FUSION  02/ 2018    [facility] ([place])  . TONSILLECTOMY  child  . 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 TRANSTHORACIC ECHOCARDIOGRAM  01/21/2009   mild LVH, ef 19-75%, grade 1 diastolic dysfunction  . ULNAR NERVE TRANSPOSITION Left 12-20-2008  dr sypher   decompression and partial resection medial triceps fascia  . WIDE LOCAL EXCISION PILONIDAL AREA  02-02-2002  [name]   recurrent squamous cell carcinoma in situ    There were no vitals filed for this visit.      Subjective Assessment - 12/11/16 0932    Subjective I have trouble getting up from a beach chair   Pertinent History back surgery, knee replacement   How long can you walk comfortably? 20-30 minutes with increased pain (7 minutes is start of pain)   Patient Stated Goals be able to take hour walks and be more active   Currently in Pain? Yes   Pain Score 1    Pain Location Knee   Pain Orientation Left   Pain Descriptors / Indicators Sore   Pain Type Chronic pain  Pain Radiating Towards into the legs    Pain Onset More than a month ago   Pain Frequency Constant   Aggravating Factors  beneing and getting down on the floor, stiff after standing   Pain Relieving Factors rest   Effect of Pain on Daily Activities be able to walk further and getting up and down from the floor   Multiple Pain Sites No            [facility] PT Assessment - 12/11/16 0001      Assessment   Medical Diagnosis M25.562,G89.29,Z96.652 (ICD-10-CM) - Chronic knee pain after total replacement of left knee joint   Onset Date/Surgical Date --  about one year   Next MD Visit 2 months   Prior Therapy yes     Precautions   Precautions None     [place] residence      Prior Function   Level of Independence Independent     Cognition   Overall Cognitive Status Within Functional Limits for tasks assessed     Observation/Other Assessments-Edema    Edema Circumferential     Circumferential Edema   Circumferential - Left  knee 50 cm around patella     Posture/Postural Control   Posture/Postural Control Postural limitations   Postural Limitations Rounded Shoulders;Anterior pelvic tilt     AROM   Overall AROM  Deficits     Strength   Overall Strength Comments Right LE 5/5; Left LE 4/5 throughout     Flexibility   Soft Tissue Assessment /Muscle Length yes   Hamstrings 25% limited   Quadriceps 25% limited   ITB 25% limited   Piriformis 25% limited  (quads)     Palpation   Patella mobility tight superior/inferior, med/lateral stiffness   Palpation comment patellar tendon sore with mild pitting edema around knee,                      [facility] Adult PT Treatment/Exercise - 12/11/16 0001      Lumbar Exercises: Stretches   Active Hamstring Stretch 1 rep  bil. in sitting   Piriformis Stretch 1 rep;10 seconds  bil. right easier than left     Lumbar Exercises: Seated   Long Arc Quad on Chair Strengthening;Left;2 sets;15 reps;Weights   LAQ on Chair Weights (lbs) 2   LAQ on Chair Limitations ball squeeze; 2 sets of 10 in midrange     Knee/Hip Exercises: Standing   Forward Step Up 2 sets;10 reps;Hand Hold: 2  on steps     Knee/Hip Exercises: Seated   Hamstring Curl 2 sets;15 reps  green band   Hamstring Limitations with ball squeeze     Modalities   Modalities Vasopneumatic     Vasopneumatic   Number Minutes Vasopneumatic  15 minutes   Vasopnuematic Location  Knee  left   Vasopneumatic Pressure Medium   Vasopneumatic Temperature  3 snow     Manual Therapy   Manual Therapy Soft tissue mobilization;Joint mobilization   Joint Mobilization left patella mobilization all directions   Soft tissue mobilization to left quads, left  ilitibial band; left hamstring, left patella tendon                  PT Short Term Goals - 12/09/16 1631      PT SHORT TERM GOAL #1   Title independent with initial HEP   Time 4   Period Weeks   Status New  PT SHORT TERM GOAL #2   Title pt will report knee pain 4/10 at most due to improved soft tissue length   Time 4   Period Weeks   Status New     PT SHORT TERM GOAL #3   Title pt will be able to tolerate 15 minute walk without needing to sit down due to pain for reintroduction to walking routine   Time 4   Period Weeks   Status New     PT SHORT TERM GOAL #4   Title MMT 4+/5 for left hip abduction and external rotation for improved functional movements   Time 4   Period Weeks   Status New           PT Long Term Goals - 12/09/16 1633      PT LONG TERM GOAL #1   Title independent with advanced HEP   Time 8   Period Weeks   Status New     PT LONG TERM GOAL #2   Title able to take 30 min walk up to 2x/day with tolerable amount of pain for return to normal activity   Baseline .[name]   Time 8   Period Weeks   Status New     PT LONG TERM GOAL #3   Title knee pain reduced by 50% during the most aggravating activity for improved function   Baseline ...   Time 8   Period Weeks   Status New     PT LONG TERM GOAL #4   Title able to manage pain with movement and stretches when getting up after sitting still for long periods of time   Baseline ...   Time 8   Period Weeks   Status New     PT LONG TERM GOAL #5   Title ....               Plan - 12/11/16 1009    Clinical Impression Statement Patient was able to exercise but needs verbal cues to not work in pain.  Patient has trigger points in the left lateral quads and iliotibial band. Patient has swelling in left knee. Patient will benefit  to increase strength and reduce pain.    Clinical Impairments Affecting Rehab Potential history of back surgery and chronic pain   PT Frequency 2x / week    PT Duration 8 weeks   PT Treatment/Interventions ADLs/Self Care Home Management;Biofeedback;Cryotherapy;Moist Heat;Gait training;Stair training;Functional mobility training;Electrical Stimulation;Therapeutic activities;Therapeutic exercise;Balance training;Neuromuscular re-education;Patient/family education;Manual techniques;Dry needling;Taping;Iontophoresis 4mg /ml Dexamethasone;Passive range of motion;Vasopneumatic Device   PT Next Visit Plan hip and knee stretches and ROM, quad strengtheing, patella mobs, pain relief; vasopnuematic device   PT Home Exercise Plan progress as needed   Consulted and Agree with Plan of Care Patient      Patient will benefit from skilled therapeutic intervention in order to improve the following deficits and impairments:  Abnormal gait, Decreased coordination, Decreased endurance, Decreased range of motion, Difficulty walking, Decreased strength, Pain, Impaired flexibility, Postural dysfunction, Increased edema  Visit Diagnosis: Chronic pain of left knee - Plan: PT plan of care cert/re-cert  Muscle weakness (generalized) - Plan: PT plan of care cert/re-cert  Difficulty in walking, not elsewhere classified - Plan: PT plan of care cert/re-cert  Acute bilateral low back pain, with sciatica presence unspecified - Plan: PT plan of care cert/re-cert  Other muscle spasm - Plan: PT plan of care cert/re-cert     Problem List Patient Active Problem List   Diagnosis Date Noted  .  Vitamin D deficiency 12/08/2016  . B12 deficiency 12/08/2016  . Tubular adenoma of colon 04/29/2016  . Essential hypertension 09/12/2015  . Allergic rhinitis 07/17/2015  . Insomnia 07/17/2015  . Obese 04/10/2015  . S/P left TKA 04/09/2015  . Hyperglycemia 01/09/2015  . Osteopenia 12/04/2012  . 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 RESTLESS LEG SYNDROME 02/11/2009  . LEG EDEMA, BILATERAL 02/11/2009  . Hyperlipidemia 10/30/2008  . Urinary incontinence 10/30/2008  . Skin cancer 10/30/2008   . Asthma 11/21/2007  . Venous (peripheral) insufficiency 11/22/2006  . SNORING 11/22/2006    Earlie Counts, PT 12/11/16 10:17 AM   [facility] [address] [place], [place], [address] Phone: [phone]   Fax:  [phone]  Name: Erin Roach MRN: [id number] Date of Birth: [birth year]

## 2016-12-11 NOTE — Telephone Encounter (Signed)
recieved electronic fax from patients pharmacy stating a need for the amitriptyline medication, prior authorization started yesterday  Prior authorization returned as being DENIED due to patients diagnosis of insomnia and RLS as according to FDA standards it is used for antidepressant.  Please select new medication or secondary diagnosses

## 2016-12-13 ENCOUNTER — Encounter: Payer: Self-pay | Admitting: Internal Medicine

## 2016-12-14 NOTE — Telephone Encounter (Signed)
The prescription was for neuropathic pain as well as sleep disturbance.  Thanks.

## 2016-12-15 DIAGNOSIS — M4800 Spinal stenosis, site unspecified: Secondary | ICD-10-CM | POA: Diagnosis not present

## 2016-12-15 LAB — CBC
HCT: 36.8 % (ref 36.0–46.0)
Hemoglobin: 12.8 g/dL (ref 12.0–15.0)
MCH: 30.3 pg (ref 26.0–34.0)
MCHC: 34.8 g/dL (ref 30.0–36.0)
MCV: 87 fL (ref 78.0–100.0)
PLATELETS: 242 10*3/uL (ref 150–400)
RBC: 4.23 MIL/uL (ref 3.87–5.11)
RDW: 13 % (ref 11.5–15.5)
WBC: 6.8 10*3/uL (ref 4.0–10.5)

## 2016-12-16 ENCOUNTER — Ambulatory Visit: Payer: Medicare Other

## 2016-12-16 ENCOUNTER — Telehealth: Payer: Self-pay | Admitting: *Deleted

## 2016-12-16 DIAGNOSIS — G8929 Other chronic pain: Secondary | ICD-10-CM

## 2016-12-16 DIAGNOSIS — M25562 Pain in left knee: Secondary | ICD-10-CM | POA: Diagnosis not present

## 2016-12-16 DIAGNOSIS — M6281 Muscle weakness (generalized): Secondary | ICD-10-CM

## 2016-12-16 DIAGNOSIS — R262 Difficulty in walking, not elsewhere classified: Secondary | ICD-10-CM

## 2016-12-16 DIAGNOSIS — M62838 Other muscle spasm: Secondary | ICD-10-CM

## 2016-12-16 DIAGNOSIS — M545 Low back pain: Secondary | ICD-10-CM

## 2016-12-16 NOTE — Telephone Encounter (Addendum)
Prior authorization returned as being DENIED because of patients diagnosis of insomnia and RLS. According to FDA clinical indications, it is used for antidepressant.  Please select a new medication or provide a diagnosis for depression

## 2016-12-16 NOTE — Telephone Encounter (Signed)
Prior authorization for diclofenac sodium 1% gel was approved

## 2016-12-16 NOTE — Therapy (Addendum)
[facility] [address] [place], [place], [address] Phone: [phone]   Fax:  [phone]  Physical Therapy Treatment  Patient Details  Name: Erin Roach MRN: [id number] Date of Birth: 10/05/1940 Referring Provider: Paralee Cancel  Encounter Date: 12/16/2016      PT End of Session - 12/16/16 2059    Visit Number 3   Number of Visits 10   Date for PT Re-Evaluation 03/03/17   Authorization Type gcodes at 10th visit; KX at 41   PT Start Time 1332   PT Stop Time 1426   PT Time Calculation (min) 54 min   Activity Tolerance Patient tolerated treatment well   Behavior During Therapy [facility] for tasks assessed/performed      Past Medical History:  Diagnosis Date  . Allergic rhinitis   . Arthritis   . Asthma    very mild  . Bilateral lower extremity edema   . Carotid artery disease ([place])    a. Carotid duplex 03/2016 - duplex was stable, 79-89% RICA, 2-11% LICA  . Complication of anesthesia    post-op delirium  . Endometrial polyp   . Essential hypertension   . GERD (gastroesophageal reflux disease)   . Heart murmur    per pt  . History of adenomatous polyp of colon    04-24-2016  tubular adenoma  . History of squamous cell carcinoma excision    pilonidal area second excision 02-02-2002  . Hyperlipidemia   . OAB (overactive bladder)   . RLS (restless legs syndrome)   . SUI (stress urinary incontinence, female)   . Venous insufficiency    legs    Past Surgical History:  Procedure Laterality Date  . APPENDECTOMY  07-02-2005   [name]   open  . CARDIAC CATHETERIZATION  01-23-2009  dr Darnell Level brodie   normal coronary arteries and lvf  . CARDIOVASCULAR STRESS TEST  03/31/2016   Low risk nuclear study w/ medium defect of mild severity in basal anterior and mid anterior location w/ no evidence ischemia or infarction/  normal LV function and wall motion,  nuclear stress ef 71%  . CATARACT EXTRACTION W/  INTRAOCULAR LENS IMPLANT Left 09/2016  . COLONOSCOPY  last one 04-24-2016  . POSTERIOR LUMBAR FUSION  02/ 2018    [facility] ([place])  . TONSILLECTOMY  child  . 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 TRANSTHORACIC ECHOCARDIOGRAM  01/21/2009   mild LVH, ef 94-17%, grade 1 diastolic dysfunction  . ULNAR NERVE TRANSPOSITION Left 12-20-2008  dr sypher   decompression and partial resection medial triceps fascia  . WIDE LOCAL EXCISION PILONIDAL AREA  02-02-2002  [name]   recurrent squamous cell carcinoma in situ    There were no vitals filed for this visit.      Subjective Assessment - 12/16/16 2050    Subjective Pt. doing well today no new complaints.     Currently in Pain? No/denies   Pain Score 0-No pain   Multiple Pain Sites No                         [facility] Adult PT Treatment/Exercise - 12/16/16 2052      Lumbar Exercises: Stretches   Piriformis Stretch 2 reps;30 seconds  L with strap      Lumbar Exercises: Machines for Strengthening  Cybex Knee Flexion B con/L ecc 25# x 15 reps   Leg Press B LE's 50# x 20 reps; ~ 90 dg      Knee/Hip Exercises: Stretches   Active Hamstring Stretch Left;1 rep;30 seconds  with strap    Quad Stretch Left;1 rep;60 seconds  with strap    Gastroc Stretch Left;30 seconds;2 reps  standing into counter      Knee/Hip Exercises: Aerobic   Nustep Lvl 2, 6 min      Knee/Hip Exercises: Standing   Forward Step Up Left;10 reps  lung stretch for L knee     Knee/Hip Exercises: Supine   Other Supine Knee/Hip Exercises L knee flexion stretch with heel on p-ball and strap assist. 5" x 10 reps     Vasopneumatic   Number Minutes Vasopneumatic  15 minutes   Vasopnuematic Location  Knee  L    Vasopneumatic Pressure Medium   Vasopneumatic Temperature  coldest temp.      Manual  Therapy   Manual Therapy Soft tissue mobilization;Joint mobilization   Manual therapy comments R sidelying with L LE elevated on bolster   Joint Mobilization left patella mobilization all directions  very limited motion    Soft tissue mobilization to left quads, left ilitibial band; left hamstring, left patella tendon                  PT Short Term Goals - 12/09/16 1631      PT SHORT TERM GOAL #1   Title independent with initial HEP   Time 4   Period Weeks   Status New     PT SHORT TERM GOAL #2   Title pt will report knee pain 4/10 at most due to improved soft tissue length   Time 4   Period Weeks   Status New     PT SHORT TERM GOAL #3   Title pt will be able to tolerate 15 minute walk without needing to sit down due to pain for reintroduction to walking routine   Time 4   Period Weeks   Status New     PT SHORT TERM GOAL #4   Title MMT 4+/5 for left hip abduction and external rotation for improved functional movements   Time 4   Period Weeks   Status New           PT Long Term Goals - 12/09/16 1633      PT LONG TERM GOAL #1   Title independent with advanced HEP   Time 8   Period Weeks   Status New     PT LONG TERM GOAL #2   Title able to take 30 min walk up to 2x/day with tolerable amount of pain for return to normal activity   Baseline .[name]   Time 8   Period Weeks   Status New     PT LONG TERM GOAL #3   Title knee pain reduced by 50% during the most aggravating activity for improved function   Baseline ...   Time 8   Period Weeks   Status New     PT LONG TERM GOAL #4   Title able to manage pain with movement and stretches when getting up after sitting still for long periods of time   Baseline ...   Time 8   Period Weeks   Status New     PT LONG TERM GOAL #5   Title .Marland KitchenMarland KitchenMarland Kitchen  Plan - 12/16/16 2100    Clinical Impression Statement Pt. doing well today.  Still with notable tightness/TP's in L lateral thigh musculature  with limited response to TPR today.  Patellar mobs performed with some improved mobility following manual work.  Pt. tolerated all activities in treatment well.  Ended with ice/compression to reduce post-exercise swelling and pain.     PT Treatment/Interventions ADLs/Self Care Home Management;Biofeedback;Cryotherapy;Moist Heat;Gait training;Stair training;Functional mobility training;Electrical Stimulation;Therapeutic activities;Therapeutic exercise;Balance training;Neuromuscular re-education;Patient/family education;Manual techniques;Dry needling;Taping;Iontophoresis 4mg /ml Dexamethasone;Passive range of motion;Vasopneumatic Device   PT Next Visit Plan hip and knee stretches and ROM, quad strengtheing, patella mobs, pain relief; vasopnuematic device      Patient will benefit from skilled therapeutic intervention in order to improve the following deficits and impairments:  Abnormal gait, Decreased coordination, Decreased endurance, Decreased range of motion, Difficulty walking, Decreased strength, Pain, Impaired flexibility, Postural dysfunction, Increased edema  Visit Diagnosis: Chronic pain of left knee  Muscle weakness (generalized)  Difficulty in walking, not elsewhere classified  Acute bilateral low back pain, with sciatica presence unspecified  Other muscle spasm     Problem List Patient Active Problem List   Diagnosis Date Noted  . Vitamin D deficiency 12/08/2016  . B12 deficiency 12/08/2016  . Tubular adenoma of colon 04/29/2016  . Essential hypertension 09/12/2015  . Allergic rhinitis 07/17/2015  . Insomnia 07/17/2015  . Obese 04/10/2015  . S/P left TKA 04/09/2015  . Hyperglycemia 01/09/2015  . Osteopenia 12/04/2012  . 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 RESTLESS LEG SYNDROME 02/11/2009  . LEG EDEMA, BILATERAL 02/11/2009  . Hyperlipidemia 10/30/2008  . Urinary incontinence 10/30/2008  . Skin cancer 10/30/2008  . Asthma 11/21/2007  . Venous (peripheral) insufficiency  11/22/2006  . SNORING 11/22/2006    Bess Harvest, PTA 12/16/16 9:09 PM  [facility] [address], [place], [place], [address] Phone: [phone]   Fax:  [phone]  Name: Erin Roach MRN: [id number] Date of Birth: 1941/01/06

## 2016-12-20 ENCOUNTER — Encounter: Payer: Self-pay | Admitting: Internal Medicine

## 2016-12-21 ENCOUNTER — Ambulatory Visit: Payer: Medicare Other | Admitting: Rehabilitative and Restorative Service Providers"

## 2016-12-22 ENCOUNTER — Encounter (HOSPITAL_BASED_OUTPATIENT_CLINIC_OR_DEPARTMENT_OTHER): Payer: Self-pay | Admitting: *Deleted

## 2016-12-22 ENCOUNTER — Ambulatory Visit (HOSPITAL_BASED_OUTPATIENT_CLINIC_OR_DEPARTMENT_OTHER)
Admission: RE | Admit: 2016-12-22 | Discharge: 2016-12-22 | Disposition: A | Discharge status: Home / Self Care | Payer: Medicare Other | Source: Ambulatory Visit | Attending: Obstetrics and Gynecology | Admitting: Obstetrics and Gynecology

## 2016-12-22 ENCOUNTER — Encounter (HOSPITAL_BASED_OUTPATIENT_CLINIC_OR_DEPARTMENT_OTHER)
Admission: RE | Disposition: A | Discharge status: Home / Self Care | Payer: Self-pay | Source: Ambulatory Visit | Attending: Obstetrics and Gynecology

## 2016-12-22 ENCOUNTER — Ambulatory Visit (HOSPITAL_BASED_OUTPATIENT_CLINIC_OR_DEPARTMENT_OTHER): Payer: Medicare Other | Admitting: Anesthesiology

## 2016-12-22 DIAGNOSIS — N840 Polyp of corpus uteri: Secondary | ICD-10-CM | POA: Insufficient documentation

## 2016-12-22 DIAGNOSIS — K219 Gastro-esophageal reflux disease without esophagitis: Secondary | ICD-10-CM | POA: Insufficient documentation

## 2016-12-22 DIAGNOSIS — Z85828 Personal history of other malignant neoplasm of skin: Secondary | ICD-10-CM | POA: Insufficient documentation

## 2016-12-22 DIAGNOSIS — Z79899 Other long term (current) drug therapy: Secondary | ICD-10-CM | POA: Insufficient documentation

## 2016-12-22 DIAGNOSIS — I10 Essential (primary) hypertension: Secondary | ICD-10-CM | POA: Insufficient documentation

## 2016-12-22 DIAGNOSIS — Z6834 Body mass index (BMI) 34.0-34.9, adult: Secondary | ICD-10-CM | POA: Diagnosis not present

## 2016-12-22 DIAGNOSIS — Z87891 Personal history of nicotine dependence: Secondary | ICD-10-CM | POA: Diagnosis not present

## 2016-12-22 DIAGNOSIS — G2581 Restless legs syndrome: Secondary | ICD-10-CM | POA: Insufficient documentation

## 2016-12-22 DIAGNOSIS — E785 Hyperlipidemia, unspecified: Secondary | ICD-10-CM | POA: Diagnosis not present

## 2016-12-22 DIAGNOSIS — E669 Obesity, unspecified: Secondary | ICD-10-CM | POA: Diagnosis not present

## 2016-12-22 HISTORY — DX: Stress incontinence (female) (male): N39.3

## 2016-12-22 HISTORY — DX: Gastro-esophageal reflux disease without esophagitis: K21.9

## 2016-12-22 HISTORY — DX: Personal history of colonic polyps: Z86.010

## 2016-12-22 HISTORY — DX: Polyp of corpus uteri: N84.0

## 2016-12-22 HISTORY — PX: HYSTEROSCOPY WITH D & C: SHX1775

## 2016-12-22 HISTORY — DX: Allergic rhinitis, unspecified: J30.9

## 2016-12-22 HISTORY — DX: Overactive bladder: N32.81

## 2016-12-22 HISTORY — DX: Localized edema: R60.0

## 2016-12-22 HISTORY — DX: Other specified postprocedural states: Z85.9

## 2016-12-22 HISTORY — DX: Adverse effect of unspecified anesthetic, initial encounter: T41.45XA

## 2016-12-22 HISTORY — DX: Other specified postprocedural states: Z98.890

## 2016-12-22 HISTORY — DX: Other complications of anesthesia, initial encounter: T88.59XA

## 2016-12-22 HISTORY — DX: Personal history of adenomatous and serrated colon polyps: Z86.0101

## 2016-12-22 SURGERY — DILATATION AND CURETTAGE /HYSTEROSCOPY
Anesthesia: General | Site: Vagina

## 2016-12-22 MED ORDER — DEXAMETHASONE SODIUM PHOSPHATE 4 MG/ML IJ SOLN
INTRAMUSCULAR | Status: DC | PRN
  Administered 2016-12-22: 10 mg via INTRAVENOUS

## 2016-12-22 MED ORDER — FENTANYL CITRATE (PF) 100 MCG/2ML IJ SOLN
INTRAMUSCULAR | Status: AC
  Filled 2016-12-22: qty 2

## 2016-12-22 MED ORDER — KETOROLAC TROMETHAMINE 30 MG/ML IJ SOLN
INTRAMUSCULAR | Status: AC
  Filled 2016-12-22: qty 1

## 2016-12-22 MED ORDER — SODIUM CHLORIDE 0.9 % IR SOLN
Status: DC | PRN
  Administered 2016-12-22: 3000 mL

## 2016-12-22 MED ORDER — CEFOTETAN DISODIUM-DEXTROSE 2-2.08 GM-% IV SOLR
INTRAVENOUS | Status: AC
  Filled 2016-12-22: qty 50

## 2016-12-22 MED ORDER — ONDANSETRON HCL 4 MG/2ML IJ SOLN
INTRAMUSCULAR | Status: AC
  Filled 2016-12-22: qty 2

## 2016-12-22 MED ORDER — PROPOFOL 10 MG/ML IV BOLUS
INTRAVENOUS | Status: AC
  Filled 2016-12-22: qty 40

## 2016-12-22 MED ORDER — LACTATED RINGERS IV SOLN
INTRAVENOUS | Status: DC
  Administered 2016-12-22: 07:00:00 via INTRAVENOUS
  Filled 2016-12-22: qty 1000

## 2016-12-22 MED ORDER — FENTANYL CITRATE (PF) 100 MCG/2ML IJ SOLN
INTRAMUSCULAR | Status: DC | PRN
  Administered 2016-12-22 (×4): 25 ug via INTRAVENOUS

## 2016-12-22 MED ORDER — LIDOCAINE 2% (20 MG/ML) 5 ML SYRINGE
INTRAMUSCULAR | Status: DC | PRN
  Administered 2016-12-22: 100 mg via INTRAVENOUS

## 2016-12-22 MED ORDER — HYDROMORPHONE HCL 1 MG/ML IJ SOLN
0.2500 mg | INTRAMUSCULAR | Status: DC | PRN
  Administered 2016-12-22: 0.25 mg via INTRAVENOUS
  Filled 2016-12-22: qty 0.5

## 2016-12-22 MED ORDER — ONDANSETRON HCL 4 MG/2ML IJ SOLN
4.0000 mg | Freq: Once | INTRAMUSCULAR | Status: DC | PRN
  Filled 2016-12-22: qty 2

## 2016-12-22 MED ORDER — MEPERIDINE HCL 25 MG/ML IJ SOLN
6.2500 mg | INTRAMUSCULAR | Status: DC | PRN
  Filled 2016-12-22: qty 1

## 2016-12-22 MED ORDER — DEXTROSE 5 % IV SOLN
2.0000 g | INTRAVENOUS | Status: AC
  Administered 2016-12-22: 2 g via INTRAVENOUS
  Filled 2016-12-22: qty 2

## 2016-12-22 MED ORDER — KETOROLAC TROMETHAMINE 30 MG/ML IJ SOLN
INTRAMUSCULAR | Status: DC | PRN
  Administered 2016-12-22 (×2): 30 mg via INTRAVENOUS

## 2016-12-22 MED ORDER — PROPOFOL 10 MG/ML IV BOLUS
INTRAVENOUS | Status: DC | PRN
Start: 2016-12-22 — End: 2016-12-22
  Administered 2016-12-22: 160 mg via INTRAVENOUS

## 2016-12-22 MED ORDER — LIDOCAINE HCL 1 % IJ SOLN
INTRAMUSCULAR | Status: DC | PRN
  Administered 2016-12-22: 10 mL

## 2016-12-22 MED ORDER — ONDANSETRON HCL 4 MG/2ML IJ SOLN
INTRAMUSCULAR | Status: DC | PRN
  Administered 2016-12-22: 4 mg via INTRAVENOUS

## 2016-12-22 MED ORDER — HYDROMORPHONE HCL-NACL 0.5-0.9 MG/ML-% IV SOSY
PREFILLED_SYRINGE | INTRAVENOUS | Status: AC
  Filled 2016-12-22: qty 1

## 2016-12-22 MED ORDER — LIDOCAINE 2% (20 MG/ML) 5 ML SYRINGE
INTRAMUSCULAR | Status: AC
  Filled 2016-12-22: qty 5

## 2016-12-22 MED ORDER — DEXAMETHASONE SODIUM PHOSPHATE 10 MG/ML IJ SOLN
INTRAMUSCULAR | Status: AC
  Filled 2016-12-22: qty 1

## 2016-12-22 SURGICAL SUPPLY — 19 items
BIPOLAR CUTTING LOOP 21FR (ELECTRODE)
CANISTER SUCT 3000ML PPV (MISCELLANEOUS) ×2 IMPLANT
CATH ROBINSON RED A/P 16FR (CATHETERS) ×2 IMPLANT
CLOTH BEACON ORANGE TIMEOUT ST (SAFETY) ×2 IMPLANT
COUNTER NEEDLE 1200 MAGNETIC (NEEDLE) ×2 IMPLANT
DILATOR CANAL MILEX (MISCELLANEOUS) IMPLANT
GLOVE BIO SURGEON STRL SZ 6.5 (GLOVE) ×4 IMPLANT
GLOVE LITE  25/BX (GLOVE) ×1 IMPLANT
GOWN STRL REUS W/TWL LRG LVL3 (GOWN DISPOSABLE) ×4 IMPLANT
KIT RM TURNOVER CYSTO AR (KITS) ×2 IMPLANT
LOOP CUTTING BIPOLAR 21FR (ELECTRODE) IMPLANT
NDL SPNL 25GX3.5 QUINCKE BL (NEEDLE) ×1 IMPLANT
NEEDLE SPNL 25GX3.5 QUINCKE BL (NEEDLE) ×2 IMPLANT
PACK VAGINAL MINOR WOMEN LF (CUSTOM PROCEDURE TRAY) ×4 IMPLANT
PAD PREP 24X48 CUFFED NSTRL (MISCELLANEOUS) ×2 IMPLANT
TOWEL OR 17X24 6PK STRL BLUE (TOWEL DISPOSABLE) ×4 IMPLANT
TUBING AQUILEX INFLOW (TUBING) ×2 IMPLANT
TUBING AQUILEX OUTFLOW (TUBING) ×2 IMPLANT
WATER STERILE IRR 500ML POUR (IV SOLUTION) ×2 IMPLANT

## 2016-12-22 NOTE — Progress Notes (Signed)
Patient and spouse instructed not to take any nonsteroidal anti inflammatories such as motrin, advil, ibuprofen, aleve until after 2:00pm.  These instructions written on discharge instructions given to patient as well.

## 2016-12-22 NOTE — Anesthesia Preprocedure Evaluation (Signed)
Anesthesia Evaluation  Patient identified by MRN, date of birth, ID band Patient awake    Airway Mallampati: II  TM Distance: >3 FB Neck ROM: Full    Dental   Pulmonary asthma , former smoker,    Pulmonary exam normal        Cardiovascular hypertension, Pt. on medications Normal cardiovascular exam     Neuro/Psych    GI/Hepatic GERD  Medicated and Controlled,  Endo/Other    Renal/GU      Musculoskeletal   Abdominal   Peds  Hematology   Anesthesia Other Findings   Reproductive/Obstetrics                             Anesthesia Physical Anesthesia Plan  ASA: II  Anesthesia Plan: General   Post-op Pain Management:    Induction: Intravenous  PONV Risk Score and Plan: 3 and Ondansetron, Dexamethasone, Midazolam and Treatment may vary due to age or medical condition  Airway Management Planned: LMA  Additional Equipment:   Intra-op Plan:   Post-operative Plan: Extubation in OR  Informed Consent: I have reviewed the patients History and Physical, chart, labs and discussed the procedure including the risks, benefits and alternatives for the proposed anesthesia with the patient or authorized representative who has indicated his/her understanding and acceptance.     Plan Discussed with: CRNA and Surgeon  Anesthesia Plan Comments:         Anesthesia Quick Evaluation

## 2016-12-22 NOTE — Discharge Instructions (Signed)
DISCHARGE INSTRUCTIONS: HYSTEROSCOPY / ENDOMETRIAL ABLATION The following instructions have been prepared to help you care for yourself upon your return home.  May Remove Scop patch on or before   (Not applicable)  May take Ibuprofen after  2:00pm today  May take stool softner while taking narcotic pain medication to prevent constipation.  Drink plenty of water.  Personal hygiene:  Use sanitary pads for vaginal drainage, not tampons.  Shower the day after your procedure.  NO tub baths, pools or Jacuzzis for 2-3 weeks.  Wipe front to back after using the bathroom.  Activity and limitations:  Do NOT drive or operate any equipment for 24 hours. The effects of anesthesia are still present and drowsiness may result.  Do NOT rest in bed all day.  Walking is encouraged.  Walk up and down stairs slowly.  You may resume your normal activity in one to two days or as indicated by your physician. Sexual activity: NO intercourse for at least 2 weeks after the procedure, or as indicated by your Doctor.  Diet: Eat a light meal as desired this evening. You may resume your usual diet tomorrow.  Return to Work: You may resume your work activities in one to two days or as indicated by Marine scientist.  What to expect after your surgery: Expect to have vaginal bleeding/discharge for 2-3 days and spotting for up to 10 days. It is not unusual to have soreness for up to 1-2 weeks. You may have a slight burning sensation when you urinate for the first day. Mild cramps may continue for a couple of days. You may have a regular period in 2-6 weeks.  Call your doctor for any of the following:  Excessive vaginal bleeding or clotting, saturating and changing one pad every hour.  Inability to urinate 6 hours after discharge from hospital.  Pain not relieved by pain medication.  Fever of 100.4 F or greater.  Unusual vaginal discharge or odor.  Return to office _________________Call for an  appointment ___________________ Patients signature: ______________________ Nurses signature ________________________  Post Anesthesia Care Unit [phone] Post Anesthesia Home Care Instructions  Activity: Get plenty of rest for the remainder of the day. A responsible individual must stay with you for 24 hours following the procedure.  For the next 24 hours, DO NOT: -Drive a car -Paediatric nurse -Drink alcoholic beverages -Take any medication unless instructed by your physician -Make any legal decisions or sign important papers.  Meals: Start with liquid foods such as gelatin or soup. Progress to regular foods as tolerated. Avoid greasy, spicy, heavy foods. If nausea and/or vomiting occur, drink only clear liquids until the nausea and/or vomiting subsides. Call your physician if vomiting continues.  Special Instructions/Symptoms: Your throat may feel dry or sore from the anesthesia or the breathing tube placed in your throat during surgery. If this causes discomfort, gargle with warm salt water. The discomfort should disappear within 24 hours.  If you had a scopolamine patch placed behind your ear for the management of post- operative nausea and/or vomiting:  1. The medication in the patch is effective for 72 hours, after which it should be removed.  Wrap patch in a tissue and discard in the trash. Wash hands thoroughly with soap and water. 2. You may remove the patch earlier than 72 hours if you experience unpleasant side effects which may include dry mouth, dizziness or visual disturbances. 3. Avoid touching the patch. Wash your hands with soap and water after contact with the patch.

## 2016-12-22 NOTE — Op Note (Signed)
NAME:  Erin Roach, Erin Roach               ACCOUNT NO.:  [id number]  MEDICAL RECORD NO.:  [id number]  LOCATION:                                 FACILITY:  PHYSICIAN:  Loy Mccartt L. Adem Costlow, M.D.DATE OF BIRTH:  05/05/1941  DATE OF PROCEDURE: DATE OF DISCHARGE:                              OPERATIVE REPORT   PREOP DIAGNOSIS:  Endometrial polyp.  POSTOP DIAGNOSIS:  Endometrial polyp.  PROCEDURE:  Hysteroscopy, dilatation and curettage, and removal of endometrial polyp.  SURGEON:  Lawrance Wiedemann L. Helane Rima, M.D.  ANESTHESIA:  LMA with paracervical block.  FLUIDS DEFICIT:  40 mL.  COMPLICATIONS:  None.  PATHOLOGY:  Uterine/endometrial tissue.  DESCRIPTION OF PROCEDURE:  The patient was taken to the operating room. She was administered anesthesia.  She was prepped and draped in usual sterile fashion.  Time-out was performed.  A speculum was inserted into the vagina, and the cervix was grasped with a tenaculum.  The paracervical block was performed.  It was noted she did have some cervical stenosis.  I was able to easily dilate the cervical internal os using the [facility] dilators.  A hysteroscope was inserted and with good visualization, the entire endometrium was inspected.  The endometrium was very atrophic.  There was 1 small endometrial polyp that was attached to the fundal region of the uterus.  The scope was removed.  A small sharp curette was inserted.  The uterus was thoroughly curetted. A polyp forceps was then inserted and tissue was removed.  The hysteroscope was reinserted, and again with good visualization, the polyp was completely resected.  At the end of the procedure, all instruments were removed from the vagina.  All sponge, lap, and instrument counts were correct x2.  The patient went to recovery room stable condition.     Greyson Peavy L. Helane Rima, M.D.     [name]  D:  12/22/2016  T:  12/22/2016  Job:  [id number]

## 2016-12-22 NOTE — Transfer of Care (Signed)
Immediate Anesthesia Transfer of Care Note  Patient: Erin Roach  Procedure(s) Performed: Procedure(s) (LRB): DILATATION AND CURETTAGE /HYSTEROSCOPY (N/A)  Patient Location: PACU  Anesthesia Type: General  Level of Consciousness: awake, sedated, patient cooperative and responds to stimulation  Airway & Oxygen Therapy: Patient Spontanous Breathing and Patient connected to NCO2  Post-op Assessment: Report given to PACU RN, Post -op Vital signs reviewed and stable and Patient moving all extremities  Post vital signs: Reviewed and stable  Complications: No apparent anesthesia complications

## 2016-12-22 NOTE — Anesthesia Postprocedure Evaluation (Signed)
Anesthesia Post Note  Patient: Erin Roach  Procedure(s) Performed: Procedure(s) (LRB): DILATATION AND CURETTAGE /HYSTEROSCOPY (N/A)     Patient location during evaluation: PACU Anesthesia Type: General Level of consciousness: awake and alert Pain management: pain level controlled Vital Signs Assessment: post-procedure vital signs reviewed and stable Respiratory status: spontaneous breathing, nonlabored ventilation, respiratory function stable and patient connected to nasal cannula oxygen Cardiovascular status: blood pressure returned to baseline and stable Postop Assessment: no signs of nausea or vomiting Anesthetic complications: no    Last Vitals:  Vitals:   12/22/16 0845 12/22/16 0900  BP: 125/72   Pulse: 79   Resp: 10 14  Temp:    SpO2: 97%     Last Pain:  Vitals:   12/22/16 0900  TempSrc:   PainSc: 1                  Verley Pariseau DAVID

## 2016-12-22 NOTE — Brief Op Note (Signed)
12/22/2016  8:02 AM  PATIENT:  Erin Roach  76 y.o. female  PRE-OPERATIVE DIAGNOSIS:   Endometrial Polyp  POST-OPERATIVE DIAGNOSIS:  Same  PROCEDURE:  Procedure(s): DILATATION AND CURETTAGE /HYSTEROSCOPY (N/A) Resection of Endometrial Polyp  SURGEON:  Surgeon(s) and Role:    * Dian Queen, MD - Primary  PHYSICIAN ASSISTANT:   ASSISTANTS: none   ANESTHESIA:   paracervical block and MAC  EBL:  Total I/O In: 200 [I.V.:200] Out: 5 [Blood:5]  BLOOD ADMINISTERED:none  DRAINS: none   LOCAL MEDICATIONS USED:  LIDOCAINE   SPECIMEN:  Source of Specimen:  endometrial tissue  DISPOSITION OF SPECIMEN:  PATHOLOGY  COUNTS:  YES  TOURNIQUET:  * No tourniquets in log *  DICTATION: .Other Dictation: Dictation Number [id number]  PLAN OF CARE: Discharge to home after PACU  PATIENT DISPOSITION:  PACU - hemodynamically stable.   Delay start of Pharmacological VTE agent (>24hrs) due to surgical blood loss or risk of bleeding: not applicable

## 2016-12-22 NOTE — Anesthesia Procedure Notes (Signed)
Procedure Name: LMA Insertion Date/Time: 12/22/2016 7:38 AM Performed by: Justice Rocher Pre-anesthesia Checklist: Patient identified, Emergency Drugs available, Suction available and Patient being monitored Patient Re-evaluated:Patient Re-evaluated prior to induction Oxygen Delivery Method: Circle system utilized Preoxygenation: Pre-oxygenation with 100% oxygen Induction Type: IV induction Ventilation: Mask ventilation without difficulty LMA: LMA inserted LMA Size: 4.0 Number of attempts: 1 Airway Equipment and Method: Bite block Placement Confirmation: positive ETCO2 and breath sounds checked- equal and bilateral Tube secured with: Tape Dental Injury: Teeth and Oropharynx as per pre-operative assessment

## 2016-12-22 NOTE — Progress Notes (Signed)
H and P on the chart No changes Will proceed with D and C, possible hysteroscopy Consent signed

## 2016-12-23 ENCOUNTER — Encounter (HOSPITAL_BASED_OUTPATIENT_CLINIC_OR_DEPARTMENT_OTHER): Payer: Self-pay | Admitting: Obstetrics and Gynecology

## 2016-12-24 ENCOUNTER — Encounter: Payer: Medicare Other | Admitting: Physical Therapy

## 2016-12-29 ENCOUNTER — Ambulatory Visit: Payer: Medicare Other | Admitting: Physical Therapy

## 2016-12-29 DIAGNOSIS — G8929 Other chronic pain: Secondary | ICD-10-CM

## 2016-12-29 DIAGNOSIS — M25562 Pain in left knee: Principal | ICD-10-CM

## 2016-12-29 DIAGNOSIS — M6281 Muscle weakness (generalized): Secondary | ICD-10-CM

## 2016-12-29 DIAGNOSIS — M545 Low back pain: Secondary | ICD-10-CM

## 2016-12-29 DIAGNOSIS — R262 Difficulty in walking, not elsewhere classified: Secondary | ICD-10-CM

## 2016-12-29 NOTE — Therapy (Signed)
[facility] [address], [place], [place], [address] Phone: [phone]   Fax:  [phone]  Physical Therapy Treatment  Patient Details  Name: Erin Roach MRN: [id number] Date of Birth: 04-25-1941 Referring Provider: Dewaine Oats [id number]  Encounter Date: 12/29/2016      PT End of Session - 12/29/16 1628    Visit Number 4   Number of Visits 10   Date for PT Re-Evaluation 03/03/17   Authorization Type gcodes at 10th visit; KX at 19 (had previous episode this year with 10 visits)   PT Start Time 1617   PT Stop Time 1700   PT Time Calculation (min) 43 min   Activity Tolerance Patient tolerated treatment well   Behavior During Therapy [facility] for tasks assessed/performed      Past Medical History:  Diagnosis Date  . Allergic rhinitis   . Arthritis   . Asthma    very mild  . Bilateral lower extremity edema   . Carotid artery disease ([place])    a. Carotid duplex 03/2016 - duplex was stable, 14-48% RICA, 1-85% LICA  . Complication of anesthesia    post-op delirium  . Endometrial polyp   . Essential hypertension   . GERD (gastroesophageal reflux disease)   . Heart murmur    per pt  . History of adenomatous polyp of colon    04-24-2016  tubular adenoma  . History of squamous cell carcinoma excision    pilonidal area second excision 02-02-2002  . Hyperlipidemia   . OAB (overactive bladder)   . RLS (restless legs syndrome)   . SUI (stress urinary incontinence, female)   . Venous insufficiency    legs    Past Surgical History:  Procedure Laterality Date  . APPENDECTOMY  07-02-2005   [name]   open  . CARDIAC CATHETERIZATION  01-23-2009  dr Darnell Level brodie   normal coronary arteries and lvf  . CARDIOVASCULAR STRESS TEST  03/31/2016   Low risk nuclear study w/ medium defect of mild severity in basal anterior and mid anterior location w/ no evidence ischemia or infarction/  normal LV function and  wall motion,  nuclear stress ef 71%  . CATARACT EXTRACTION W/ INTRAOCULAR LENS IMPLANT Left 09/2016  . COLONOSCOPY  last one 04-24-2016  . HYSTEROSCOPY W/D&C N/A 12/22/2016   Procedure: DILATATION AND CURETTAGE /HYSTEROSCOPY;  Surgeon: Dian Queen, MD;  Location: [facility];  Service: Gynecology;  Laterality: N/A;  . POSTERIOR LUMBAR FUSION  02/ 2018    [facility] ([place])  . TONSILLECTOMY  child  . 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 TRANSTHORACIC ECHOCARDIOGRAM  01/21/2009   mild LVH, ef 63-14%, grade 1 diastolic dysfunction  . ULNAR NERVE TRANSPOSITION Left 12-20-2008  dr sypher   decompression and partial resection medial triceps fascia  . WIDE LOCAL EXCISION PILONIDAL AREA  02-02-2002  [name]   recurrent squamous cell carcinoma in situ    There were no vitals filed for this visit.      Subjective Assessment - 12/29/16 1623    Subjective I got the back added to my referral.  The knee hurts going up stairs and when I walk, but it doesn't cause me to stop.  I didn't think it was supposed to still hurt at this point.  My back still hurts when I'm tired  after doing a lot of yard work.     Pertinent History back surgery, knee replacement   Limitations Walking   How long can you walk comfortably? 20-30 minutes with increased pain (7 minutes is start of pain)   Patient Stated Goals be able to take hour walks and be more active   Currently in Pain? Yes   Pain Score 3    Pain Location Knee   Pain Orientation Lower;Left   Pain Descriptors / Indicators Aching   Pain Type Chronic pain   Pain Onset More than a month ago   Pain Frequency Intermittent   Aggravating Factors  pressing with the left knee   Pain Relieving Factors rest   Multiple Pain Sites No            [facility] PT Assessment - 12/29/16 0001       Assessment   Medical Diagnosis chronic left knee and low back pain   Referring Provider Georgann Housekeeper REUBEN [id number]     AROM   Overall AROM Comments right knee: 118 deg flex, 0 ext; Left knee 115 deg flex, -4 deg extension  lumbar 25% limited     Strength   Overall Strength Comments Right LE 5/5; Left LE 4/5 throughout     Flexibility   Hamstrings 25% limited   Quadriceps 25% limited   ITB 25% limited   Piriformis 25% limited  (quads)     Palpation   Patella mobility tight superior/inferior, med/lateral stiffness                     [facility] Adult PT Treatment/Exercise - 12/29/16 0001      Lumbar Exercises: Seated   Long Arc Quad on Chair Strengthening;Left;2 sets;15 reps;Weights   LAQ on Chair Weights (lbs) 5     Knee/Hip Exercises: [employer] operational officer Left;Right;3 reps;20 seconds  with strap    Quad Stretch Left;1 rep;60 seconds  with strap and some overpressure from PT   Piriformis Stretch Right;Left;3 reps;20 seconds   Other Knee/Hip Stretches ITB stretch with strap - 3x20 sec      Knee/Hip Exercises: Aerobic   Nustep Lvl 2, 10 min      Knee/Hip Exercises: Supine   Short Arc Quad Sets Strengthening;Left;10 reps;2 sets   Short Arc Quad Sets Limitations 5 lb; 5 sec hold                  PT Short Term Goals - 12/29/16 1727      PT SHORT TERM GOAL #1   Title independent with initial HEP   Time 4   Period Weeks   Status On-going   Target Date 01/26/17     PT SHORT TERM GOAL #2   Title pt will report knee pain 4/10 at most due to improved soft tissue length   Time 4   Period Weeks   Status On-going   Target Date 01/26/17     PT SHORT TERM GOAL #3   Title pt will report 25% reduction in low back pain during normal walking routine   Time 4   Period Weeks   Status New   Target Date 01/26/17     PT SHORT TERM GOAL #4   Title MMT 4+/5 for left hip abduction and external rotation for improved functional movements    Time 4   Period Weeks   Status New   Target Date 01/26/17  PT Long Term Goals - 12/29/16 1730      PT LONG TERM GOAL #1   Title independent with advanced HEP   Time 8   Period Weeks   Status New   Target Date 03/03/17     PT LONG TERM GOAL #2   Title able to take 30 min walk up to 2x/day with tolerable amount of pain in back and knee for return to normal activity   Time 8   Period Weeks   Status New   Target Date 03/03/17     PT LONG TERM GOAL #3   Title back and knee pain reduced by 50% during the most aggravating activity for improved function   Time 8   Period Weeks   Status New   Target Date 03/03/17     PT LONG TERM GOAL #4   Title able to manage pain with movement and stretches when getting up after sitting still for long periods of time   Period Weeks   Status New   Target Date 03/03/17               Plan - 12/29/16 1630    Clinical Impression Statement Patient presents to PT with back added to plan of care.  Goals and diagnosis have been added to plan of care to reflect new orders.  Exam findings as seen in report.     Clinical Impairments Affecting Rehab Potential history of back surgery and chronic pain   PT Frequency 2x / week   PT Duration 8 weeks   PT Treatment/Interventions ADLs/Self Care Home Management;Biofeedback;Cryotherapy;Moist Heat;Gait training;Stair training;Functional mobility training;Electrical Stimulation;Therapeutic activities;Therapeutic exercise;Balance training;Neuromuscular re-education;Patient/family education;Manual techniques;Dry needling;Taping;Iontophoresis 4mg /ml Dexamethasone;Passive range of motion;Vasopneumatic Device   PT Next Visit Plan hip and knee stretches and ROM, quad strengtheing, patella mobs, pain relief; vasopnuematic device   Consulted and Agree with Plan of Care Patient      Patient will benefit from skilled therapeutic intervention in order to improve the following deficits and impairments:   Abnormal gait, Decreased coordination, Decreased endurance, Decreased range of motion, Difficulty walking, Decreased strength, Pain, Impaired flexibility, Postural dysfunction, Increased edema  Visit Diagnosis: Chronic pain of left knee  Muscle weakness (generalized)  Difficulty in walking, not elsewhere classified  Chronic low back pain, unspecified back pain laterality, with sciatica presence unspecified     Problem List Patient Active Problem List   Diagnosis Date Noted  . Vitamin D deficiency 12/08/2016  . B12 deficiency 12/08/2016  . Tubular adenoma of colon 04/29/2016  . Essential hypertension 09/12/2015  . Allergic rhinitis 07/17/2015  . Insomnia 07/17/2015  . Obese 04/10/2015  . S/P left TKA 04/09/2015  . Hyperglycemia 01/09/2015  . Osteopenia 12/04/2012  . 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 RESTLESS LEG SYNDROME 02/11/2009  . LEG EDEMA, BILATERAL 02/11/2009  . Hyperlipidemia 10/30/2008  . Urinary incontinence 10/30/2008  . Skin cancer 10/30/2008  . Asthma 11/21/2007  . Venous (peripheral) insufficiency 11/22/2006  . SNORING 11/22/2006    Zannie Cove, PT 12/29/2016, 5:32 PM  [facility] [address] [place], [place], [address] Phone: [phone]   Fax:  [phone]  Name: SHALLON YAKLIN MRN: [id number] Date of Birth: May 22, 1940

## 2016-12-31 ENCOUNTER — Ambulatory Visit: Payer: Medicare Other

## 2016-12-31 DIAGNOSIS — R262 Difficulty in walking, not elsewhere classified: Secondary | ICD-10-CM

## 2016-12-31 DIAGNOSIS — M25562 Pain in left knee: Secondary | ICD-10-CM | POA: Diagnosis not present

## 2016-12-31 DIAGNOSIS — M6281 Muscle weakness (generalized): Secondary | ICD-10-CM

## 2016-12-31 DIAGNOSIS — M545 Low back pain: Secondary | ICD-10-CM

## 2016-12-31 DIAGNOSIS — G8929 Other chronic pain: Secondary | ICD-10-CM

## 2016-12-31 NOTE — Therapy (Signed)
[facility] [address], [place], [place], [address] Phone: [phone]   Fax:  [phone]  Physical Therapy Treatment  Patient Details  Name: Erin Roach MRN: [id number] Date of Birth: [birth year] Referring Provider: Dewaine Oats [id number]  Encounter Date: 12/31/2016      PT End of Session - 12/31/16 1539    Visit Number 5   Number of Visits 10   Date for PT Re-Evaluation 03/03/17   Authorization Type gcodes at 10th visit; KX at 84 (had previous episode this year with 10 visits)   PT Start Time 1532   PT Stop Time 1630   PT Time Calculation (min) 58 min   Activity Tolerance Patient tolerated treatment well   Behavior During Therapy [facility] for tasks assessed/performed      Past Medical History:  Diagnosis Date  . Allergic rhinitis   . Arthritis   . Asthma    very mild  . Bilateral lower extremity edema   . Carotid artery disease ([place])    a. Carotid duplex 03/2016 - duplex was stable, 26-37% RICA, 8-58% LICA  . Complication of anesthesia    post-op delirium  . Endometrial polyp   . Essential hypertension   . GERD (gastroesophageal reflux disease)   . Heart murmur    per pt  . History of adenomatous polyp of colon    04-24-2016  tubular adenoma  . History of squamous cell carcinoma excision    pilonidal area second excision 02-02-2002  . Hyperlipidemia   . OAB (overactive bladder)   . RLS (restless legs syndrome)   . SUI (stress urinary incontinence, female)   . Venous insufficiency    legs    Past Surgical History:  Procedure Laterality Date  . APPENDECTOMY  07-02-2005   [name]   open  . CARDIAC CATHETERIZATION  01-23-2009  dr Darnell Level brodie   normal coronary arteries and lvf  . CARDIOVASCULAR STRESS TEST  03/31/2016   Low risk nuclear study w/ medium defect of mild severity in basal anterior and mid anterior location w/ no evidence ischemia or infarction/  normal LV function and  wall motion,  nuclear stress ef 71%  . CATARACT EXTRACTION W/ INTRAOCULAR LENS IMPLANT Left 09/2016  . COLONOSCOPY  last one 04-24-2016  . HYSTEROSCOPY W/D&C N/A 12/22/2016   Procedure: DILATATION AND CURETTAGE /HYSTEROSCOPY;  Surgeon: Dian Queen, MD;  Location: [facility];  Service: Gynecology;  Laterality: N/A;  . POSTERIOR LUMBAR FUSION  02/ 2018    [facility] ([place], )  . TONSILLECTOMY  child  . 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 TRANSTHORACIC ECHOCARDIOGRAM  01/21/2009   mild LVH, ef 85-02%, grade 1 diastolic dysfunction  . ULNAR NERVE TRANSPOSITION Left 12-20-2008  dr sypher   decompression and partial resection medial triceps fascia  . WIDE LOCAL EXCISION PILONIDAL AREA  02-02-2002  [name]   recurrent squamous cell carcinoma in situ    There were no vitals filed for this visit.      Subjective Assessment - 12/31/16 1537    Subjective Pt. doing well however noting tenderness over entirety of L knee and L LE.  Notes she "did not take water pill this morning".     Patient Stated Goals be able to take hour walks and be more active   Currently in Pain? Yes  Pain Score 2    Pain Location Knee   Pain Orientation Left   Pain Descriptors / Indicators Aching;Dull;Constant   Pain Type Chronic pain   Pain Radiating Towards into the lower leg    Pain Onset More than a month ago   Pain Frequency Constant   Multiple Pain Sites No                         [facility] Adult PT Treatment/Exercise - 12/31/16 1550      Ambulation/Gait   Stairs Yes   Stairs Assistance 6: Modified independent (Device/Increase time)   Stair Management Technique Alternating pattern   Number of Stairs 4   Height of Stairs 7   Pre-Gait Activities Pt. demonstrating limited quad control with L eccentric quad descending      Lumbar Exercises: Seated   Long Arc Quad on Chair Strengthening;Left;2 sets;15 reps;Weights   LAQ on Chair Weights (lbs) 5     Knee/Hip Exercises: Stretches   Hip Flexor Stretch Left;1 rep;30 seconds   Hip Flexor Stretch Limitations in mod thomas position with therapist     Knee/Hip Exercises: Aerobic   Recumbent Bike Bike lvl 2, 8 min      Knee/Hip Exercises: Seated   Hamstring Curl 15 reps;Left   Hamstring Limitations with green TB      Knee/Hip Exercises: Supine   Straight Leg Raises Left;10 reps;2 sets   Straight Leg Raises Limitations 2#    Other Supine Knee/Hip Exercises L knee flexion stretch with heel on p-ball and strap assist. 5" x 10 reps     Knee/Hip Exercises: Sidelying   Hip ABduction 15 reps;Left;2 sets   Hip ABduction Limitations 2#      Vasopneumatic   Number Minutes Vasopneumatic  15 minutes   Vasopnuematic Location  Knee   Vasopneumatic Pressure Medium   Vasopneumatic Temperature  coldest temp.                   PT Short Term Goals - 12/29/16 1727      PT SHORT TERM GOAL #1   Title independent with initial HEP   Time 4   Period Weeks   Status On-going   Target Date 01/26/17     PT SHORT TERM GOAL #2   Title pt will report knee pain 4/10 at most due to improved soft tissue length   Time 4   Period Weeks   Status On-going   Target Date 01/26/17     PT SHORT TERM GOAL #3   Title pt will report 25% reduction in low back pain during normal walking routine   Time 4   Period Weeks   Status New   Target Date 01/26/17     PT SHORT TERM GOAL #4   Title MMT 4+/5 for left hip abduction and external rotation for improved functional movements   Time 4   Period Weeks   Status New   Target Date 01/26/17           PT Long Term Goals - 12/31/16 1731      PT LONG TERM GOAL #1   Title independent with advanced HEP   Time 8   Period Weeks   Status On-going     PT LONG TERM GOAL #2   Title able to take 30 min walk up to 2x/day with  tolerable amount of pain in back and knee for return to normal activity   Time 8  Period Weeks   Status On-going     PT LONG TERM GOAL #3   Title back and knee pain reduced by 50% during the most aggravating activity for improved function   Time 8   Period Weeks   Status On-going     PT LONG TERM GOAL #4   Title able to manage pain with movement and stretches when getting up after sitting still for long periods of time   Period Weeks   Status On-going               Plan - 12/31/16 [place] doing well today however noting increased tenderness/sensitivity surrounding L knee and anterior shin.  Pt. with notable edema in B LE's and admitting to not taking Lasix/"water pill" today.  Pt. noting her back has been feeling good recently thus treatment focusing on ROM and strength of L knee.  Tolerated all therex well today able to progress reps with strengthening activities.  Pt. noting L LE fatigue following quad strengthening activity today.  Demonstrated limited L quad eccentric control with descending stairs today.  Pt. will continue to benefit from further skilled therapy to improve knee ROM, strength, and functional ability.     PT Treatment/Interventions ADLs/Self Care Home Management;Biofeedback;Cryotherapy;Moist Heat;Gait training;Stair training;Functional mobility training;Electrical Stimulation;Therapeutic activities;Therapeutic exercise;Balance training;Neuromuscular re-education;Patient/family education;Manual techniques;Dry needling;Taping;Iontophoresis 4mg /ml Dexamethasone;Passive range of motion;Vasopneumatic Device   PT Next Visit Plan hip and knee stretches and ROM, quad strengtheing, patella mobs, pain relief; vasopnuematic device      Patient will benefit from skilled therapeutic intervention in order to improve the following deficits and impairments:  Abnormal gait, Decreased coordination, Decreased endurance, Decreased range of motion,  Difficulty walking, Decreased strength, Pain, Impaired flexibility, Postural dysfunction, Increased edema  Visit Diagnosis: Chronic pain of left knee  Muscle weakness (generalized)  Difficulty in walking, not elsewhere classified  Chronic low back pain, unspecified back pain laterality, with sciatica presence unspecified     Problem List Patient Active Problem List   Diagnosis Date Noted  . Vitamin D deficiency 12/08/2016  . B12 deficiency 12/08/2016  . Tubular adenoma of colon 04/29/2016  . Essential hypertension 09/12/2015  . Allergic rhinitis 07/17/2015  . Insomnia 07/17/2015  . Obese 04/10/2015  . S/P left TKA 04/09/2015  . Hyperglycemia 01/09/2015  . Osteopenia 12/04/2012  . 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 RESTLESS LEG SYNDROME 02/11/2009  . LEG EDEMA, BILATERAL 02/11/2009  . Hyperlipidemia 10/30/2008  . Urinary incontinence 10/30/2008  . Skin cancer 10/30/2008  . Asthma 11/21/2007  . Venous (peripheral) insufficiency 11/22/2006  . SNORING 11/22/2006    Bess Harvest, PTA 12/31/16 5:32 PM  [facility] [address], [place], [place], [address] Phone: [phone]   Fax:  [phone]  Name: Erin Roach MRN: [id number] Date of Birth: December 31, 1940

## 2017-01-06 ENCOUNTER — Ambulatory Visit: Payer: Medicare Other | Admitting: Physical Therapy

## 2017-01-06 DIAGNOSIS — M25562 Pain in left knee: Principal | ICD-10-CM

## 2017-01-06 DIAGNOSIS — M6281 Muscle weakness (generalized): Secondary | ICD-10-CM

## 2017-01-06 DIAGNOSIS — G8929 Other chronic pain: Secondary | ICD-10-CM

## 2017-01-06 DIAGNOSIS — M545 Low back pain: Secondary | ICD-10-CM

## 2017-01-06 DIAGNOSIS — R262 Difficulty in walking, not elsewhere classified: Secondary | ICD-10-CM

## 2017-01-06 NOTE — Therapy (Signed)
[facility] [address] [place], [place], [address] Phone: [phone]   Fax:  [phone]  Physical Therapy Treatment  Patient Details  Name: Erin Roach MRN: [id number] Date of Birth: July 10, 1940 Referring Provider: Dewaine Oats [id number]  Encounter Date: 01/06/2017      PT End of Session - 01/06/17 1310    Visit Number 6   Number of Visits 10   Date for PT Re-Evaluation 03/03/17   Authorization Type gcodes at 10th visit; KX at 23 (had previous episode this year with 10 visits)   PT Start Time 1232   PT Stop Time 1315   PT Time Calculation (min) 43 min   Activity Tolerance Patient tolerated treatment well   Behavior During Therapy [facility] for tasks assessed/performed      Past Medical History:  Diagnosis Date  . Allergic rhinitis   . Arthritis   . Asthma    very mild  . Bilateral lower extremity edema   . Carotid artery disease ([place])    a. Carotid duplex 03/2016 - duplex was stable, 62-83% RICA, 1-51% LICA  . Complication of anesthesia    post-op delirium  . Endometrial polyp   . Essential hypertension   . GERD (gastroesophageal reflux disease)   . Heart murmur    per pt  . History of adenomatous polyp of colon    04-24-2016  tubular adenoma  . History of squamous cell carcinoma excision    pilonidal area second excision 02-02-2002  . Hyperlipidemia   . OAB (overactive bladder)   . RLS (restless legs syndrome)   . SUI (stress urinary incontinence, female)   . Venous insufficiency    legs    Past Surgical History:  Procedure Laterality Date  . APPENDECTOMY  07-02-2005   [name]   open  . CARDIAC CATHETERIZATION  01-23-2009  dr Darnell Level brodie   normal coronary arteries and lvf  . CARDIOVASCULAR STRESS TEST  03/31/2016   Low risk nuclear study w/ medium defect of mild severity in basal anterior and mid anterior location w/ no evidence ischemia or infarction/  normal LV function and  wall motion,  nuclear stress ef 71%  . CATARACT EXTRACTION W/ INTRAOCULAR LENS IMPLANT Left 09/2016  . COLONOSCOPY  last one 04-24-2016  . HYSTEROSCOPY W/D&C N/A 12/22/2016   Procedure: DILATATION AND CURETTAGE /HYSTEROSCOPY;  Surgeon: Dian Queen, MD;  Location: [facility];  Service: Gynecology;  Laterality: N/A;  . POSTERIOR LUMBAR FUSION  02/ 2018    [facility] ([place])  . TONSILLECTOMY  child  . 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 TRANSTHORACIC ECHOCARDIOGRAM  01/21/2009   mild LVH, ef 76-16%, grade 1 diastolic dysfunction  . ULNAR NERVE TRANSPOSITION Left 12-20-2008  dr sypher   decompression and partial resection medial triceps fascia  . WIDE LOCAL EXCISION PILONIDAL AREA  02-02-2002  [name]   recurrent squamous cell carcinoma in situ    There were no vitals filed for this visit.      Subjective Assessment - 01/06/17 1234    Subjective Pt reports doing better and feels like she will be able to do these things on her own after the next couple of weeks.     Pertinent History back surgery, knee replacement   Limitations Walking   How long can you walk comfortably? 20-30  minutes with increased pain (7 minutes is start of pain)   Patient Stated Goals be able to take hour walks and be more active   Currently in Pain? No/denies                         [facility] Adult PT Treatment/Exercise - 01/06/17 0001      Lumbar Exercises: Stretches   Piriformis Stretch 3 reps;30 seconds     Lumbar Exercises: Seated   Long Arc Quad on Chair Strengthening;Left;2 sets;15 reps;Weights   LAQ on Chair Weights (lbs) 5     Knee/Hip Exercises: Aerobic   Nustep --     Knee/Hip Exercises: Machines for Strengthening   Cybex Leg Press bilateral 85# 2x15; single 45# Lt leg 2x15     Knee/Hip Exercises: Seated    Hamstring Curl 15 reps;Left   Hamstring Limitations with green TB      Knee/Hip Exercises: Supine   Straight Leg Raises Left;10 reps;2 sets   Straight Leg Raises Limitations 1.5#   Other Supine Knee/Hip Exercises L knee flexion stretch with heel on p-ball and strap assist. 10" x 5 reps     Knee/Hip Exercises: Sidelying   Hip ABduction 15 reps;Left;2 sets   Hip ABduction Limitations 1.5#     Vasopneumatic   Number Minutes Vasopneumatic  15 minutes   Vasopnuematic Location  Knee   Vasopneumatic Pressure Medium   Vasopneumatic Temperature  coldest temp.                   PT Short Term Goals - 12/29/16 1727      PT SHORT TERM GOAL #1   Title independent with initial HEP   Time 4   Period Weeks   Status On-going   Target Date 01/26/17     PT SHORT TERM GOAL #2   Title pt will report knee pain 4/10 at most due to improved soft tissue length   Time 4   Period Weeks   Status On-going   Target Date 01/26/17     PT SHORT TERM GOAL #3   Title pt will report 25% reduction in low back pain during normal walking routine   Time 4   Period Weeks   Status New   Target Date 01/26/17     PT SHORT TERM GOAL #4   Title MMT 4+/5 for left hip abduction and external rotation for improved functional movements   Time 4   Period Weeks   Status New   Target Date 01/26/17           PT Long Term Goals - 01/06/17 1348      PT LONG TERM GOAL #1   Title independent with advanced HEP   Time 8   Period Weeks   Status On-going     PT LONG TERM GOAL #2   Title able to take 30 min walk up to 2x/day with tolerable amount of pain in back and knee for return to normal activity   Time 8   Period Weeks   Status On-going     PT LONG TERM GOAL #3   Title back and knee pain reduced by 50% during the most aggravating activity for improved function   Time 8   Period Weeks   Status On-going     PT LONG TERM GOAL #4   Title able to manage pain with movement and stretches when  getting up after sitting still for long periods of  time   Time 8   Period Weeks   Status On-going               Plan - 01/06/17 1318    Clinical Impression Statement Patient is doing well and able to perform leg press today.  She continue to get relief from vasopneumatic ice.  Pt has LE and hip weakness and gets fatigued quickly after exercises.  Pt will benefit from skilled PT to continue working towards functional goals.   Rehab Potential Excellent   Clinical Impairments Affecting Rehab Potential history of back surgery and chronic pain   PT Treatment/Interventions ADLs/Self Care Home Management;Biofeedback;Cryotherapy;Moist Heat;Gait training;Stair training;Functional mobility training;Electrical Stimulation;Therapeutic activities;Therapeutic exercise;Balance training;Neuromuscular re-education;Patient/family education;Manual techniques;Dry needling;Taping;Iontophoresis 4mg /ml Dexamethasone;Passive range of motion;Vasopneumatic Device   PT Next Visit Plan hip and knee stretches and ROM, quad strengtheing, patella mobs, pain relief; vasopnuematic device   PT Home Exercise Plan progress as needed   Consulted and Agree with Plan of Care Patient      Patient will benefit from skilled therapeutic intervention in order to improve the following deficits and impairments:  Abnormal gait, Decreased coordination, Decreased endurance, Decreased range of motion, Difficulty walking, Decreased strength, Pain, Impaired flexibility, Postural dysfunction, Increased edema  Visit Diagnosis: Chronic pain of left knee  Muscle weakness (generalized)  Difficulty in walking, not elsewhere classified  Chronic low back pain, unspecified back pain laterality, with sciatica presence unspecified     Problem List Patient Active Problem List   Diagnosis Date Noted  . Vitamin D deficiency 12/08/2016  . B12 deficiency 12/08/2016  . Tubular adenoma of colon 04/29/2016  . Essential hypertension  09/12/2015  . Allergic rhinitis 07/17/2015  . Insomnia 07/17/2015  . Obese 04/10/2015  . S/P left TKA 04/09/2015  . Hyperglycemia 01/09/2015  . Osteopenia 12/04/2012  . 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 RESTLESS LEG SYNDROME 02/11/2009  . LEG EDEMA, BILATERAL 02/11/2009  . Hyperlipidemia 10/30/2008  . Urinary incontinence 10/30/2008  . Skin cancer 10/30/2008  . Asthma 11/21/2007  . Venous (peripheral) insufficiency 11/22/2006  . SNORING 11/22/2006    Zannie Cove, PT 01/06/2017, 1:53 PM  [facility] [address] [place], [place], [address] Phone: [phone]   Fax:  [phone]  Name: Erin Roach MRN: [id number] Date of Birth: 08-21-1940

## 2017-01-07 ENCOUNTER — Encounter: Payer: Medicare Other | Admitting: Physical Medicine & Rehabilitation

## 2017-01-08 ENCOUNTER — Encounter: Payer: Medicare Other | Admitting: Physical Therapy

## 2017-01-13 ENCOUNTER — Ambulatory Visit: Payer: Medicare Other | Attending: Orthopaedic Surgery | Admitting: Physical Therapy

## 2017-01-13 ENCOUNTER — Encounter: Payer: Self-pay | Admitting: Physical Therapy

## 2017-01-13 DIAGNOSIS — M62838 Other muscle spasm: Secondary | ICD-10-CM | POA: Insufficient documentation

## 2017-01-13 DIAGNOSIS — M6281 Muscle weakness (generalized): Secondary | ICD-10-CM | POA: Diagnosis present

## 2017-01-13 DIAGNOSIS — M545 Low back pain: Secondary | ICD-10-CM | POA: Diagnosis present

## 2017-01-13 DIAGNOSIS — R262 Difficulty in walking, not elsewhere classified: Secondary | ICD-10-CM | POA: Insufficient documentation

## 2017-01-13 DIAGNOSIS — M25562 Pain in left knee: Secondary | ICD-10-CM | POA: Diagnosis not present

## 2017-01-13 DIAGNOSIS — G8929 Other chronic pain: Secondary | ICD-10-CM | POA: Diagnosis present

## 2017-01-13 NOTE — Therapy (Signed)
[facility] [address] [place], [place], [address] Phone: [phone]   Fax:  [phone]  Physical Therapy Treatment  Patient Details  Name: Erin Roach MRN: [id number] Date of Birth: Dec 21, 1940 Referring Provider: Dewaine Oats [id number]  Encounter Date: 01/13/2017      PT End of Session - 01/13/17 1532    Visit Number 7   Number of Visits 10   Date for PT Re-Evaluation 03/03/17   Authorization Type gcodes at 10th visit; KX at 21 (had previous episode this year with 10 visits)   PT Start Time 1532   PT Stop Time 1611   PT Time Calculation (min) 39 min   Activity Tolerance Patient tolerated treatment well   Behavior During Therapy [facility] for tasks assessed/performed      Past Medical History:  Diagnosis Date  . Allergic rhinitis   . Arthritis   . Asthma    very mild  . Bilateral lower extremity edema   . Carotid artery disease ([place])    a. Carotid duplex 03/2016 - duplex was stable, 95-18% RICA, 8-41% LICA  . Complication of anesthesia    post-op delirium  . Endometrial polyp   . Essential hypertension   . GERD (gastroesophageal reflux disease)   . Heart murmur    per pt  . History of adenomatous polyp of colon    04-24-2016  tubular adenoma  . History of squamous cell carcinoma excision    pilonidal area second excision 02-02-2002  . Hyperlipidemia   . OAB (overactive bladder)   . RLS (restless legs syndrome)   . SUI (stress urinary incontinence, female)   . Venous insufficiency    legs    Past Surgical History:  Procedure Laterality Date  . APPENDECTOMY  07-02-2005   [name]   open  . CARDIAC CATHETERIZATION  01-23-2009  dr Darnell Level brodie   normal coronary arteries and lvf  . CARDIOVASCULAR STRESS TEST  03/31/2016   Low risk nuclear study w/ medium defect of mild severity in basal anterior and mid anterior location w/ no evidence ischemia or infarction/  normal LV function and wall  motion,  nuclear stress ef 71%  . CATARACT EXTRACTION W/ INTRAOCULAR LENS IMPLANT Left 09/2016  . COLONOSCOPY  last one 04-24-2016  . HYSTEROSCOPY W/D&C N/A 12/22/2016   Procedure: DILATATION AND CURETTAGE /HYSTEROSCOPY;  Surgeon: Dian Queen, MD;  Location: [facility];  Service: Gynecology;  Laterality: N/A;  . POSTERIOR LUMBAR FUSION  02/ 2018    [facility] ([place])  . TONSILLECTOMY  child  . 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 TRANSTHORACIC ECHOCARDIOGRAM  01/21/2009   mild LVH, ef 66-06%, grade 1 diastolic dysfunction  . ULNAR NERVE TRANSPOSITION Left 12-20-2008  dr sypher   decompression and partial resection medial triceps fascia  . WIDE LOCAL EXCISION PILONIDAL AREA  02-02-2002  [name]   recurrent squamous cell carcinoma in situ    There were no vitals filed for this visit.      Subjective Assessment - 01/13/17 1538    Subjective My back is feeling fine.  I did not exercise this weekend because was at the [place] and worked in the yard and walked.     Pertinent History back surgery, knee replacement   Limitations Walking   How long can you walk comfortably? 20-30  minutes with increased pain (7 minutes is start of pain)   Patient Stated Goals be able to take hour walks and be more active   Currently in Pain? Yes   Pain Score 3    Pain Location Knee   Pain Orientation Left   Pain Descriptors / Indicators Aching;Dull   Pain Type Chronic pain   Pain Onset More than a month ago   Pain Frequency Intermittent   Aggravating Factors  pushing with the left knee   Pain Relieving Factors rest   Multiple Pain Sites No                         [facility] Adult PT Treatment/Exercise - 01/13/17 0001      Lumbar Exercises: Machines for Strengthening   Cybex Knee Flexion single leg 30# 15x; 40# 15x, 50#  x 15     Lumbar Exercises: Standing   Row Strengthening;Power tower;Both;20 reps  20#   Shoulder Extension Strengthening;Power Tower;Both;20 reps  20#     Knee/Hip Exercises: Stretches   Active Hamstring Stretch Left;Right;3 reps;20 seconds     Knee/Hip Exercises: Aerobic   Nustep Lvl 1, 7 min      Knee/Hip Exercises: Machines for Strengthening   Cybex Knee Extension knee flexion 20# 2x15                  PT Short Term Goals - 01/13/17 1619      PT SHORT TERM GOAL #3   Title pt will report 25% reduction in low back pain during normal walking routine   Time 4   Period Weeks     PT SHORT TERM GOAL #4   Title MMT 4+/5 for left hip abduction and external rotation for improved functional movements   Time 4   Period Weeks   Status On-going           PT Long Term Goals - 01/06/17 1348      PT LONG TERM GOAL #1   Title independent with advanced HEP   Time 8   Period Weeks   Status On-going     PT LONG TERM GOAL #2   Title able to take 30 min walk up to 2x/day with tolerable amount of pain in back and knee for return to normal activity   Time 8   Period Weeks   Status On-going     PT LONG TERM GOAL #3   Title back and knee pain reduced by 50% during the most aggravating activity for improved function   Time 8   Period Weeks   Status On-going     PT LONG TERM GOAL #4   Title able to manage pain with movement and stretches when getting up after sitting still for long periods of time   Time 8   Period Weeks   Status On-going               Plan - 01/13/17 1533    Clinical Impression Statement Pt continues to make progress with LE strength.   She has difficulty engaging core and needed cues throughout treatment in order to prevent low back from tightening up.  Pt was able to perform exercies on gym equipment with assistance.  Pt will need to review exercises in order to transition to HEP as she will be going to the gym after finishing with PT.  Pt  benefits from skilled PT for improved core control and LE strength  Rehab Potential Excellent   Clinical Impairments Affecting Rehab Potential history of back surgery and chronic pain   PT Treatment/Interventions ADLs/Self Care Home Management;Biofeedback;Cryotherapy;Moist Heat;Gait training;Stair training;Functional mobility training;Electrical Stimulation;Therapeutic activities;Therapeutic exercise;Balance training;Neuromuscular re-education;Patient/family education;Manual techniques;Dry needling;Taping;Iontophoresis 4mg /ml Dexamethasone;Passive range of motion;Vasopneumatic Device   PT Next Visit Plan hip and knee stretches and ROM, quad strengtheing, patella mobs, pain relief; vasopnuematic device   PT Home Exercise Plan progress as needed   Consulted and Agree with Plan of Care Patient      Patient will benefit from skilled therapeutic intervention in order to improve the following deficits and impairments:  Abnormal gait, Decreased coordination, Decreased endurance, Decreased range of motion, Difficulty walking, Decreased strength, Pain, Impaired flexibility, Postural dysfunction, Increased edema  Visit Diagnosis: Chronic pain of left knee  Muscle weakness (generalized)  Difficulty in walking, not elsewhere classified  Chronic low back pain, unspecified back pain laterality, with sciatica presence unspecified     Problem List Patient Active Problem List   Diagnosis Date Noted  . Vitamin D deficiency 12/08/2016  . B12 deficiency 12/08/2016  . Tubular adenoma of colon 04/29/2016  . Essential hypertension 09/12/2015  . Allergic rhinitis 07/17/2015  . Insomnia 07/17/2015  . Obese 04/10/2015  . S/P left TKA 04/09/2015  . Hyperglycemia 01/09/2015  . Osteopenia 12/04/2012  . 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 RESTLESS LEG SYNDROME 02/11/2009  . LEG EDEMA, BILATERAL 02/11/2009  . Hyperlipidemia 10/30/2008  . Urinary incontinence 10/30/2008  . Skin cancer 10/30/2008  . Asthma  11/21/2007  . Venous (peripheral) insufficiency 11/22/2006  . SNORING 11/22/2006    Zannie Cove, PT 01/13/2017, 4:23 PM  [facility] [address], [place], [place], [address] Phone: [phone]   Fax:  [phone]  Name: Erin Roach MRN: [id number] Date of Birth: 08-15-1940

## 2017-01-15 ENCOUNTER — Ambulatory Visit: Payer: Medicare Other | Admitting: Physical Therapy

## 2017-01-15 DIAGNOSIS — M6281 Muscle weakness (generalized): Secondary | ICD-10-CM

## 2017-01-15 DIAGNOSIS — G8929 Other chronic pain: Secondary | ICD-10-CM

## 2017-01-15 DIAGNOSIS — M545 Low back pain: Secondary | ICD-10-CM

## 2017-01-15 DIAGNOSIS — M25562 Pain in left knee: Principal | ICD-10-CM

## 2017-01-15 DIAGNOSIS — R262 Difficulty in walking, not elsewhere classified: Secondary | ICD-10-CM

## 2017-01-15 NOTE — Therapy (Signed)
[facility] [address], [place], [place], [address] Phone: [phone]   Fax:  [phone]  Physical Therapy Treatment  Patient Details  Name: Erin Roach MRN: [id number] Date of Birth: [birth year] Referring Provider: Dewaine Oats [id number]  Encounter Date: 01/15/2017      PT End of Session - 01/15/17 1017    Visit Number 8   Number of Visits 10   Date for PT Re-Evaluation 03/03/17   Authorization Type gcodes at 10th visit; KX at 92 (had previous episode this year with 10 visits)   PT Start Time 1016   PT Stop Time 1100   PT Time Calculation (min) 44 min   Activity Tolerance Patient tolerated treatment well   Behavior During Therapy [facility] for tasks assessed/performed      Past Medical History:  Diagnosis Date  . Allergic rhinitis   . Arthritis   . Asthma    very mild  . Bilateral lower extremity edema   . Carotid artery disease ([place])    a. Carotid duplex 03/2016 - duplex was stable, 98-33% RICA, 8-25% LICA  . Complication of anesthesia    post-op delirium  . Endometrial polyp   . Essential hypertension   . GERD (gastroesophageal reflux disease)   . Heart murmur    per pt  . History of adenomatous polyp of colon    04-24-2016  tubular adenoma  . History of squamous cell carcinoma excision    pilonidal area second excision 02-02-2002  . Hyperlipidemia   . OAB (overactive bladder)   . RLS (restless legs syndrome)   . SUI (stress urinary incontinence, female)   . Venous insufficiency    legs    Past Surgical History:  Procedure Laterality Date  . APPENDECTOMY  07-02-2005   [name]   open  . CARDIAC CATHETERIZATION  01-23-2009  dr Darnell Level brodie   normal coronary arteries and lvf  . CARDIOVASCULAR STRESS TEST  03/31/2016   Low risk nuclear study w/ medium defect of mild severity in basal anterior and mid anterior location w/ no evidence ischemia or infarction/  normal LV function and wall  motion,  nuclear stress ef 71%  . CATARACT EXTRACTION W/ INTRAOCULAR LENS IMPLANT Left 09/2016  . COLONOSCOPY  last one 04-24-2016  . HYSTEROSCOPY W/D&C N/A 12/22/2016   Procedure: DILATATION AND CURETTAGE /HYSTEROSCOPY;  Surgeon: Dian Queen, MD;  Location: [facility];  Service: Gynecology;  Laterality: N/A;  . POSTERIOR LUMBAR FUSION  02/ 2018    [facility] ([place])  . TONSILLECTOMY  child  . 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 TRANSTHORACIC ECHOCARDIOGRAM  01/21/2009   mild LVH, ef 05-39%, grade 1 diastolic dysfunction  . ULNAR NERVE TRANSPOSITION Left 12-20-2008  dr sypher   decompression and partial resection medial triceps fascia  . WIDE LOCAL EXCISION PILONIDAL AREA  02-02-2002  [name]   recurrent squamous cell carcinoma in situ    There were no vitals filed for this visit.      Subjective Assessment - 01/15/17 1049    Subjective I would like to do the GAme REady today since my knee is swollen today, otherwise I continue to well.    Currently in Pain? No/denies   Multiple Pain Sites No  [place] Adult PT Treatment/Exercise - 01/15/17 0001      Lumbar Exercises: Stretches   Active Hamstring Stretch 3 reps;20 seconds     Lumbar Exercises: Standing   Row Power tower   [employer] Level (Row) --  20# 2x 10 with VC to initiate with core   Shoulder Extension Strengthening;Power Tower;Right;Left;10 reps   Theraband Level (Shoulder Extension) --  Did single arm 20#, TC to facilitate the lat muscle properly     Knee/Hip Exercises: Aerobic   Elliptical L1 R1 x 2 min   Nustep L2 x 10 min  PTA prsent to monitor and ask questions regarding the gym     Knee/Hip Exercises: Machines for Strengthening   Cybex Knee Flexion Bil 20# 3x10    Cybex Leg Press Seat 6: 85# 3x10,  Single LE 50# 2x 15     Vasopneumatic   Number Minutes Vasopneumatic  15 minutes   Vasopnuematic Location  Knee   Vasopneumatic Pressure Medium   Vasopneumatic Temperature  3 flakes                  PT Short Term Goals - 01/13/17 1619      PT SHORT TERM GOAL #3   Title pt will report 25% reduction in low back pain during normal walking routine   Time 4   Period Weeks     PT SHORT TERM GOAL #4   Title MMT 4+/5 for left hip abduction and external rotation for improved functional movements   Time 4   Period Weeks   Status On-going           PT Long Term Goals - 01/06/17 1348      PT LONG TERM GOAL #1   Title independent with advanced HEP   Time 8   Period Weeks   Status On-going     PT LONG TERM GOAL #2   Title able to take 30 min walk up to 2x/day with tolerable amount of pain in back and knee for return to normal activity   Time 8   Period Weeks   Status On-going     PT LONG TERM GOAL #3   Title back and knee pain reduced by 50% during the most aggravating activity for improved function   Time 8   Period Weeks   Status On-going     PT LONG TERM GOAL #4   Title able to manage pain with movement and stretches when getting up after sitting still for long periods of time   Time 8   Period Weeks   Status On-going               Plan - 01/15/17 1017    Clinical Impression Statement Due to weakness, pt had hard time with standing arm extensions, substituting substantially with her upper traps and wrist flexors. Performing this exercise with a single arm was helpful for pt to do the exercise correctly.     Rehab Potential Excellent   Clinical Impairments Affecting Rehab Potential history of back surgery and chronic pain   PT Frequency 2x / week   PT Duration 8 weeks   PT Treatment/Interventions ADLs/Self Care Home Management;Biofeedback;Cryotherapy;Moist Heat;Gait training;Stair training;Functional mobility training;Electrical  Stimulation;Therapeutic activities;Therapeutic exercise;Balance training;Neuromuscular re-education;Patient/family education;Manual techniques;Dry needling;Taping;Iontophoresis 4mg /ml Dexamethasone;Passive range of motion;Vasopneumatic Device   PT Next Visit Plan hip and knee stretches and ROM, quad strengtheing, patella mobs, pain relief; vasopnuematic device   Consulted and Agree with Plan of Care Patient  Patient will benefit from skilled therapeutic intervention in order to improve the following deficits and impairments:  Abnormal gait, Decreased coordination, Decreased endurance, Decreased range of motion, Difficulty walking, Decreased strength, Pain, Impaired flexibility, Postural dysfunction, Increased edema  Visit Diagnosis: Chronic pain of left knee  Muscle weakness (generalized)  Difficulty in walking, not elsewhere classified  Chronic low back pain, unspecified back pain laterality, with sciatica presence unspecified     Problem List Patient Active Problem List   Diagnosis Date Noted  . Vitamin D deficiency 12/08/2016  . B12 deficiency 12/08/2016  . Tubular adenoma of colon 04/29/2016  . Essential hypertension 09/12/2015  . Allergic rhinitis 07/17/2015  . Insomnia 07/17/2015  . Obese 04/10/2015  . S/P left TKA 04/09/2015  . Hyperglycemia 01/09/2015  . Osteopenia 12/04/2012  . 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 RESTLESS LEG SYNDROME 02/11/2009  . LEG EDEMA, BILATERAL 02/11/2009  . Hyperlipidemia 10/30/2008  . Urinary incontinence 10/30/2008  . Skin cancer 10/30/2008  . Asthma 11/21/2007  . Venous (peripheral) insufficiency 11/22/2006  . SNORING 11/22/2006    Tymere Depuy, PTA 01/15/2017, 10:57 AM   [facility] [address] [place], [place], [address] Phone: [phone]   Fax:  [phone]  Name: BANITA LEHN MRN: [id number] Date of Birth: 1940/09/07

## 2017-01-18 ENCOUNTER — Telehealth: Payer: Self-pay | Admitting: Internal Medicine

## 2017-01-18 ENCOUNTER — Encounter: Payer: Self-pay | Admitting: Physical Therapy

## 2017-01-18 ENCOUNTER — Ambulatory Visit: Payer: Medicare Other | Admitting: Physical Therapy

## 2017-01-18 DIAGNOSIS — M25562 Pain in left knee: Principal | ICD-10-CM

## 2017-01-18 DIAGNOSIS — M62838 Other muscle spasm: Secondary | ICD-10-CM

## 2017-01-18 DIAGNOSIS — R262 Difficulty in walking, not elsewhere classified: Secondary | ICD-10-CM

## 2017-01-18 DIAGNOSIS — G8929 Other chronic pain: Secondary | ICD-10-CM

## 2017-01-18 DIAGNOSIS — M545 Low back pain: Secondary | ICD-10-CM

## 2017-01-18 DIAGNOSIS — M6281 Muscle weakness (generalized): Secondary | ICD-10-CM

## 2017-01-18 MED ORDER — HYDROCODONE-ACETAMINOPHEN 5-325 MG PO TABS: 1.0000 | ORAL_TABLET | Freq: Every day | ORAL | 0 refills | Status: DC

## 2017-01-18 NOTE — Therapy (Signed)
[facility] [address], [place], [place], [address] Phone: [phone]   Fax:  [phone]  Physical Therapy Treatment  Patient Details  Name: Erin Roach MRN: [id number] Date of Birth: Jul 23, 1940 Referring Provider: Dewaine Oats [id number]  Encounter Date: 01/18/2017      PT End of Session - 01/18/17 1018    Visit Number 9   Number of Visits 10   Date for PT Re-Evaluation 03/03/17   Authorization Type gcodes at 10th visit; KX at 70 (had previous episode this year with 10 visits)   PT Start Time 1015   PT Stop Time 1105   PT Time Calculation (min) 50 min   Activity Tolerance Patient tolerated treatment well   Behavior During Therapy [facility] for tasks assessed/performed      Past Medical History:  Diagnosis Date  . Allergic rhinitis   . Arthritis   . Asthma    very mild  . Bilateral lower extremity edema   . Carotid artery disease ([place])    a. Carotid duplex 03/2016 - duplex was stable, 24-09% RICA, 7-35% LICA  . Complication of anesthesia    post-op delirium  . Endometrial polyp   . Essential hypertension   . GERD (gastroesophageal reflux disease)   . Heart murmur    per pt  . History of adenomatous polyp of colon    04-24-2016  tubular adenoma  . History of squamous cell carcinoma excision    pilonidal area second excision 02-02-2002  . Hyperlipidemia   . OAB (overactive bladder)   . RLS (restless legs syndrome)   . SUI (stress urinary incontinence, female)   . Venous insufficiency    legs    Past Surgical History:  Procedure Laterality Date  . APPENDECTOMY  07-02-2005   [name]   open  . CARDIAC CATHETERIZATION  01-23-2009  dr Darnell Level brodie   normal coronary arteries and lvf  . CARDIOVASCULAR STRESS TEST  03/31/2016   Low risk nuclear study w/ medium defect of mild severity in basal anterior and mid anterior location w/ no evidence ischemia or infarction/  normal LV function and  wall motion,  nuclear stress ef 71%  . CATARACT EXTRACTION W/ INTRAOCULAR LENS IMPLANT Left 09/2016  . COLONOSCOPY  last one 04-24-2016  . HYSTEROSCOPY W/D&C N/A 12/22/2016   Procedure: DILATATION AND CURETTAGE /HYSTEROSCOPY;  Surgeon: Dian Queen, MD;  Location: [facility];  Service: Gynecology;  Laterality: N/A;  . POSTERIOR LUMBAR FUSION  02/ 2018    [facility] ([place], [place])  . TONSILLECTOMY  child  . 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 TRANSTHORACIC ECHOCARDIOGRAM  01/21/2009   mild LVH, ef 32-99%, grade 1 diastolic dysfunction  . ULNAR NERVE TRANSPOSITION Left 12-20-2008  dr sypher   decompression and partial resection medial triceps fascia  . WIDE LOCAL EXCISION PILONIDAL AREA  02-02-2002  [name]   recurrent squamous cell carcinoma in situ    There were no vitals filed for this visit.      Subjective Assessment - 01/18/17 1021    Subjective I worked in the yard Saturday and my back was sore the next day.    Currently in Pain? No/denies   Multiple Pain Sites No  [place] Adult PT Treatment/Exercise - 01/18/17 0001      Lumbar Exercises: Stretches   Active Hamstring Stretch 3 reps;20 seconds   Active Hamstring Stretch Limitations with yoga strap   Lower Trunk Rotation 3 reps;20 seconds     Lumbar Exercises: Standing   Row Power tower   [employer] Level (Row) --  20# 2x 15 with VC to initiate with core   Shoulder Extension Strengthening;Power Tower;Right;Left;10 reps   Theraband Level (Shoulder Extension) --  Did single arm 20#, TC to facilitate the lat muscle properly     Knee/Hip Exercises: Aerobic   Stationary Bike L2 x 10 min  Concurrent discussion/review on posture and body mechanics   Elliptical L1 R1 x 2:30     Knee/Hip Exercises: Machines for  Strengthening   Cybex Knee Flexion Bil 20# 3x10    Cybex Leg Press Seat 6 Bil 85# 3x10, Bil Single leg LE 50# 20x each     Vasopneumatic   Number Minutes Vasopneumatic  10 minutes   Vasopnuematic Location  Knee   Vasopneumatic Pressure Medium   Vasopneumatic Temperature  3 flakes                  PT Short Term Goals - 01/18/17 1022      PT SHORT TERM GOAL #1   Title independent with initial HEP   Time 4   Period Weeks   Status Achieved     PT SHORT TERM GOAL #2   Title pt will report knee pain 4/10 at most due to improved soft tissue length   Time 4   Period Weeks   Status Achieved     PT SHORT TERM GOAL #3   Title pt will report 25% reduction in low back pain during normal walking routine   Baseline walks 30-45 minutes   Time 4   Period Weeks   Status Achieved  35% improved     PT SHORT TERM GOAL #4   Title MMT 4+/5 for left hip abduction and external rotation for improved functional movements   Time 4   Period Weeks   Status On-going  Will measure on last session           PT Long Term Goals - 01/06/17 1348      PT LONG TERM GOAL #1   Title independent with advanced HEP   Time 8   Period Weeks   Status On-going     PT LONG TERM GOAL #2   Title able to take 30 min walk up to 2x/day with tolerable amount of pain in back and knee for return to normal activity   Time 8   Period Weeks   Status On-going     PT LONG TERM GOAL #3   Title back and knee pain reduced by 50% during the most aggravating activity for improved function   Time 8   Period Weeks   Status On-going     PT LONG TERM GOAL #4   Title able to manage pain with movement and stretches when getting up after sitting still for long periods of time   Time 8   Period Weeks   Status On-going               Plan - 01/18/17 1018    Clinical Impression Statement Pt performed postural exercises significantly better than last session. Pt did have some back pain after a day of  yard work. We discussed and reviiewed lumbar protective body mechanics  to help reduce strain to her back. Pt verbally understood all concepts discussed/. Pt met all STGs except the MMT which will be done next week.    Rehab Potential Excellent   Clinical Impairments Affecting Rehab Potential history of back surgery and chronic pain   PT Frequency 2x / week   PT Duration 8 weeks   PT Treatment/Interventions ADLs/Self Care Home Management;Biofeedback;Cryotherapy;Moist Heat;Gait training;Stair training;Functional mobility training;Electrical Stimulation;Therapeutic activities;Therapeutic exercise;Balance training;Neuromuscular re-education;Patient/family education;Manual techniques;Dry needling;Taping;Iontophoresis 19m/ml Dexamethasone;Passive range of motion;Vasopneumatic Device   PT Next Visit Plan G codes next visit, FOTO,    Consulted and Agree with Plan of Care Patient      Patient will benefit from skilled therapeutic intervention in order to improve the following deficits and impairments:  Abnormal gait, Decreased coordination, Decreased endurance, Decreased range of motion, Difficulty walking, Decreased strength, Pain, Impaired flexibility, Postural dysfunction, Increased edema  Visit Diagnosis: Chronic pain of left knee  Muscle weakness (generalized)  Difficulty in walking, not elsewhere classified  Chronic low back pain, unspecified back pain laterality, with sciatica presence unspecified  Acute bilateral low back pain, with sciatica presence unspecified  Other muscle spasm     Problem List Patient Active Problem List   Diagnosis Date Noted  . Vitamin D deficiency 12/08/2016  . B12 deficiency 12/08/2016  . Tubular adenoma of colon 04/29/2016  . Essential hypertension 09/12/2015  . Allergic rhinitis 07/17/2015  . Insomnia 07/17/2015  . Obese 04/10/2015  . S/P left TKA 04/09/2015  . Hyperglycemia 01/09/2015  . Osteopenia 12/04/2012  . 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  RESTLESS LEG SYNDROME 02/11/2009  . LEG EDEMA, BILATERAL 02/11/2009  . Hyperlipidemia 10/30/2008  . Urinary incontinence 10/30/2008  . Skin cancer 10/30/2008  . Asthma 11/21/2007  . Venous (peripheral) insufficiency 11/22/2006  . SNORING 11/22/2006    Erin Roach , PTA 01/18/2017, 10:55 AM  [facility] [address] [place] [address]Phone: [phone]  Fax:  [phone] Name: PDONI BACHAMRN: [id number] of Birth: 803-Oct-1942

## 2017-01-18 NOTE — Telephone Encounter (Signed)
Check [place] registry last filled 12/08/2016...Johny Chess

## 2017-01-18 NOTE — Telephone Encounter (Signed)
Ok to fill 

## 2017-01-18 NOTE — Telephone Encounter (Signed)
Notified pt rx ready for pick-up.../lmb 

## 2017-01-18 NOTE — Telephone Encounter (Signed)
HYDROcodone-acetaminophen (NORCO/VICODIN) 5-325 MG tablet   Patient is requesting a refill on this medication. Please advise.

## 2017-01-19 ENCOUNTER — Encounter: Payer: Medicare Other | Admitting: Physical Therapy

## 2017-01-20 ENCOUNTER — Encounter: Payer: Self-pay | Admitting: Physical Therapy

## 2017-01-20 ENCOUNTER — Ambulatory Visit: Payer: Medicare Other | Admitting: Physical Therapy

## 2017-01-20 DIAGNOSIS — G8929 Other chronic pain: Secondary | ICD-10-CM

## 2017-01-20 DIAGNOSIS — M6281 Muscle weakness (generalized): Secondary | ICD-10-CM

## 2017-01-20 DIAGNOSIS — M25562 Pain in left knee: Secondary | ICD-10-CM | POA: Diagnosis not present

## 2017-01-20 DIAGNOSIS — R262 Difficulty in walking, not elsewhere classified: Secondary | ICD-10-CM

## 2017-01-20 DIAGNOSIS — M545 Low back pain: Secondary | ICD-10-CM

## 2017-01-20 DIAGNOSIS — M62838 Other muscle spasm: Secondary | ICD-10-CM
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
## 2017-01-21 ENCOUNTER — Encounter: Payer: Medicare Other | Admitting: Physical Therapy

## 2017-01-26 ENCOUNTER — Ambulatory Visit: Payer: Medicare Other | Admitting: Physical Therapy

## 2017-01-27 ENCOUNTER — Encounter: Payer: Self-pay | Admitting: Physical Therapy

## 2017-01-27 ENCOUNTER — Ambulatory Visit: Payer: Medicare Other | Admitting: Physical Therapy

## 2017-01-27 DIAGNOSIS — R262 Difficulty in walking, not elsewhere classified: Secondary | ICD-10-CM

## 2017-01-27 DIAGNOSIS — M545 Low back pain: Secondary | ICD-10-CM

## 2017-01-27 DIAGNOSIS — M62838 Other muscle spasm: Secondary | ICD-10-CM

## 2017-01-27 DIAGNOSIS — M6281 Muscle weakness (generalized): Secondary | ICD-10-CM

## 2017-01-27 DIAGNOSIS — G8929 Other chronic pain: Secondary | ICD-10-CM

## 2017-01-27 DIAGNOSIS — M25562 Pain in left knee: Principal | ICD-10-CM

## 2017-01-27 NOTE — Therapy (Addendum)
[facility] [address] [place], [place], [address] Phone: [phone]   Fax:  [phone]  Physical Therapy Treatment  Patient Details  Name: Erin Roach MRN: [id number] Date of Birth: September 07, 1940 Referring Provider: Dewaine Oats [id number]  Encounter Date: 01/27/2017      PT End of Session - 01/27/17 1613    Visit Number 11   Number of Visits 20   Date for PT Re-Evaluation 03/03/17   Authorization Type gcodes at 10th visit; KX at 39 (had previous episode this year with 10 visits)   PT Start Time 1532   PT Stop Time 1630   PT Time Calculation (min) 58 min   Activity Tolerance Patient tolerated treatment well   Behavior During Therapy [facility] for tasks assessed/performed      Past Medical History:  Diagnosis Date  . Allergic rhinitis   . Arthritis   . Asthma    very mild  . Bilateral lower extremity edema   . Carotid artery disease ([place])    a. Carotid duplex 03/2016 - duplex was stable, 85-27% RICA, 7-82% LICA  . Complication of anesthesia    post-op delirium  . Endometrial polyp   . Essential hypertension   . GERD (gastroesophageal reflux disease)   . Heart murmur    per pt  . History of adenomatous polyp of colon    04-24-2016  tubular adenoma  . History of squamous cell carcinoma excision    pilonidal area second excision 02-02-2002  . Hyperlipidemia   . OAB (overactive bladder)   . RLS (restless legs syndrome)   . SUI (stress urinary incontinence, female)   . Venous insufficiency    legs    Past Surgical History:  Procedure Laterality Date  . APPENDECTOMY  07-02-2005   [name]   open  . CARDIAC CATHETERIZATION  01-23-2009  dr Darnell Level brodie   normal coronary arteries and lvf  . CARDIOVASCULAR STRESS TEST  03/31/2016   Low risk nuclear study w/ medium defect of mild severity in basal anterior and mid anterior location w/ no evidence ischemia or infarction/  normal LV function and  wall motion,  nuclear stress ef 71%  . CATARACT EXTRACTION W/ INTRAOCULAR LENS IMPLANT Left 09/2016  . COLONOSCOPY  last one 04-24-2016  . HYSTEROSCOPY W/D&C N/A 12/22/2016   Procedure: DILATATION AND CURETTAGE /HYSTEROSCOPY;  Surgeon: Dian Queen, MD;  Location: [facility];  Service: Gynecology;  Laterality: N/A;  . POSTERIOR LUMBAR FUSION  02/ 2018    [facility] ([place])  . TONSILLECTOMY  child  . 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 TRANSTHORACIC ECHOCARDIOGRAM  01/21/2009   mild LVH, ef 42-35%, grade 1 diastolic dysfunction  . ULNAR NERVE TRANSPOSITION Left 12-20-2008  dr sypher   decompression and partial resection medial triceps fascia  . WIDE LOCAL EXCISION PILONIDAL AREA  02-02-2002  [name]   recurrent squamous cell carcinoma in situ    There were no vitals filed for this visit.      Subjective Assessment - 01/27/17 1534    Subjective I am prepared for Friday to be my last day.    Currently in Pain? No/denies   Multiple Pain Sites No  [place] [facility] Adult PT Treatment/Exercise - 01/27/17 0001      Knee/Hip Exercises: Aerobic   Stationary Bike L2 x 10 min  PTA present   Elliptical L2 R1 x 3 min     Knee/Hip Exercises: Machines for Strengthening   Cybex Knee Flexion Bil 25# 3x10   Cybex Leg Press Seat 6 Bil 85# 3x10, Bil Single leg LE 50# 20x each     Knee/Hip Exercises: Standing   Walking with Sports Cord 25# forward/bkward 10x      Vasopneumatic   Number Minutes Vasopneumatic  15 minutes   Vasopnuematic Location  Knee   Vasopneumatic Pressure Medium   Vasopneumatic Temperature  3 flakes                  PT Short Term Goals - 01/18/17 1022      PT SHORT TERM GOAL #1   Title independent with initial HEP   Time 4   Period Weeks   Status  Achieved     PT SHORT TERM GOAL #2   Title pt will report knee pain 4/10 at most due to improved soft tissue length   Time 4   Period Weeks   Status Achieved     PT SHORT TERM GOAL #3   Title pt will report 25% reduction in low back pain during normal walking routine   Baseline walks 30-45 minutes   Time 4   Period Weeks   Status Achieved  35% improved     PT SHORT TERM GOAL #4   Title MMT 4+/5 for left hip abduction and external rotation for improved functional movements   Time 4   Period Weeks   Status On-going  Will measure on last session           PT Long Term Goals - 01/06/17 1348      PT LONG TERM GOAL #1   Title independent with advanced HEP   Time 8   Period Weeks   Status On-going     PT LONG TERM GOAL #2   Title able to take 30 min walk up to 2x/day with tolerable amount of pain in back and knee for return to normal activity   Time 8   Period Weeks   Status On-going     PT LONG TERM GOAL #3   Title back and knee pain reduced by 50% during the most aggravating activity for improved function   Time 8   Period Weeks   Status On-going     PT LONG TERM GOAL #4   Title able to manage pain with movement and stretches when getting up after sitting still for long periods of time   Time 8   Period Weeks   Status On-going               Plan - 01/27/17 1548    Clinical Impression Statement Pt was away at her [place] recently. She has done well with essentially no low back pain nor much knee pain to speak of. She plans to join a senior Pilates mat class upon discharge this week from PT and go to her gym to continue with her strengthening program established here.  Will review goals on Friday during the DC visit.    Rehab Potential Excellent   Clinical Impairments Affecting Rehab Potential history of back surgery and chronic pain   PT Frequency 2x / week   PT Duration 8 weeks   PT Treatment/Interventions ADLs/Self Care Home  Management;Biofeedback;Cryotherapy;Moist Heat;Gait training;Stair training;Functional mobility training;Electrical Stimulation;Therapeutic activities;Therapeutic exercise;Balance training;Neuromuscular re-education;Patient/family education;Manual techniques;Dry needling;Taping;Iontophoresis 4mg/ml Dexamethasone;Passive range of motion;Vasopneumatic Device   PT Next Visit Plan DC next, FOTO   Consulted and Agree with Plan of Care --      Patient will benefit from skilled therapeutic intervention in order to improve the following deficits and impairments:  Abnormal gait, Decreased coordination, Decreased endurance, Decreased range of motion, Difficulty walking, Decreased strength, Pain, Impaired flexibility, Postural dysfunction, Increased edema  Visit Diagnosis: Chronic pain of left knee  Muscle weakness (generalized)  Difficulty in walking, not elsewhere classified  Chronic low back pain, unspecified back pain laterality, with sciatica presence unspecified  Acute bilateral low back pain, with sciatica presence unspecified  Other muscle spasm  Gcodes: walking - clinical reason Current: CK Goal CK Discharge CK   Problem List Patient Active Problem List   Diagnosis Date Noted  . Vitamin D deficiency 12/08/2016  . B12 deficiency 12/08/2016  . Tubular adenoma of colon 04/29/2016  . Essential hypertension 09/12/2015  . Allergic rhinitis 07/17/2015  . Insomnia 07/17/2015  . Obese 04/10/2015  . S/P left TKA 04/09/2015  . Hyperglycemia 01/09/2015  . Osteopenia 12/04/2012  . 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 RESTLESS LEG SYNDROME 02/11/2009  . LEG EDEMA, BILATERAL 02/11/2009  . Hyperlipidemia 10/30/2008  . Urinary incontinence 10/30/2008  . Skin cancer 10/30/2008  . Asthma 11/21/2007  . Venous (peripheral) insufficiency 11/22/2006  . SNORING 11/22/2006    COCHRAN,JENNIFER, PTA 01/27/2017, 4:18 PM  [facility] [address] [place], [place], [address] Phone: [phone]   Fax:  [phone]  Name: Savayah K Koc MRN: [id number] Date of Birth: 09/25/1940  PHYSICAL THERAPY DISCHARGE SUMMARY  Visits from Start of Care: 11  Current functional level related to goals / functional outcomes: See above   Remaining deficits: See above, was planning to DC at next visit but did not attend last visit   Education / Equipment: HEP  Plan: Patient agrees to discharge.  Patient goals were partially met. Patient is being discharged due to being pleased with the current functional level.  ?????        Jakki Crosser, PT 04/26/17 10:03 AM  

## 2017-01-29 ENCOUNTER — Ambulatory Visit: Payer: Medicare Other | Admitting: Physical Therapy

## 2017-02-01 ENCOUNTER — Other Ambulatory Visit: Payer: Self-pay | Admitting: Physical Medicine & Rehabilitation

## 2017-02-02 ENCOUNTER — Encounter (INDEPENDENT_AMBULATORY_CARE_PROVIDER_SITE_OTHER): Payer: Medicare Other

## 2017-02-04 ENCOUNTER — Other Ambulatory Visit: Payer: Self-pay | Admitting: Internal Medicine

## 2017-02-08 ENCOUNTER — Other Ambulatory Visit: Payer: Self-pay | Admitting: Internal Medicine

## 2017-02-08 NOTE — Telephone Encounter (Signed)
Not on current med list.

## 2017-02-09 ENCOUNTER — Ambulatory Visit: Payer: Medicare Other | Admitting: Pulmonary Disease

## 2017-02-16 ENCOUNTER — Telehealth: Payer: Self-pay | Admitting: Internal Medicine

## 2017-02-16 MED ORDER — HYDROCODONE-ACETAMINOPHEN 5-325 MG PO TABS: 1.0000 | ORAL_TABLET | Freq: Every day | ORAL | 0 refills | Status: DC

## 2017-02-16 NOTE — Telephone Encounter (Signed)
Notified pt rx ready for pick-up.../lmb 

## 2017-02-16 NOTE — Telephone Encounter (Signed)
Pt called requesting a refill on HYDROcodone-acetaminophen (NORCO/VICODIN) 5-325 MG tablet.

## 2017-02-16 NOTE — Telephone Encounter (Signed)
Check [name] registry last filled 01/19/2017...Erin Roach

## 2017-02-16 NOTE — Telephone Encounter (Signed)
Ok to fill 

## 2017-02-23 ENCOUNTER — Ambulatory Visit (INDEPENDENT_AMBULATORY_CARE_PROVIDER_SITE_OTHER): Payer: Medicare Other | Admitting: Family Medicine

## 2017-02-23 ENCOUNTER — Encounter (INDEPENDENT_AMBULATORY_CARE_PROVIDER_SITE_OTHER): Payer: Self-pay | Admitting: Family Medicine

## 2017-02-23 VITALS — BP 112/70 | HR 90 | Temp 98.4°F | Ht 66.0 in | Wt 223.0 lb

## 2017-02-23 DIAGNOSIS — Z0289 Encounter for other administrative examinations: Secondary | ICD-10-CM

## 2017-02-23 DIAGNOSIS — Z6836 Body mass index (BMI) 36.0-36.9, adult: Secondary | ICD-10-CM

## 2017-02-23 DIAGNOSIS — R5383 Other fatigue: Secondary | ICD-10-CM

## 2017-02-23 DIAGNOSIS — R7303 Prediabetes: Secondary | ICD-10-CM | POA: Diagnosis not present

## 2017-02-23 DIAGNOSIS — Z1331 Encounter for screening for depression: Secondary | ICD-10-CM

## 2017-02-23 DIAGNOSIS — E538 Deficiency of other specified B group vitamins: Secondary | ICD-10-CM | POA: Diagnosis not present

## 2017-02-23 DIAGNOSIS — E7849 Other hyperlipidemia: Secondary | ICD-10-CM

## 2017-02-23 DIAGNOSIS — J3089 Other allergic rhinitis: Secondary | ICD-10-CM

## 2017-02-23 DIAGNOSIS — R0602 Shortness of breath: Secondary | ICD-10-CM | POA: Diagnosis not present

## 2017-02-23 DIAGNOSIS — I10 Essential (primary) hypertension: Secondary | ICD-10-CM | POA: Diagnosis not present

## 2017-02-23 DIAGNOSIS — E559 Vitamin D deficiency, unspecified: Secondary | ICD-10-CM | POA: Diagnosis not present

## 2017-02-23 DIAGNOSIS — R5382 Chronic fatigue, unspecified: Secondary | ICD-10-CM | POA: Insufficient documentation

## 2017-02-23 MED ORDER — FLUTICASONE PROPIONATE 50 MCG/ACT NA SUSP: 1.0000 | Freq: Every day | NASAL | 0 refills | Status: DC

## 2017-02-24 LAB — COMPREHENSIVE METABOLIC PANEL
ALK PHOS: 84 IU/L (ref 39–117)
ALT: 58 IU/L — ABNORMAL HIGH (ref 0–32)
AST: 41 IU/L — AB (ref 0–40)
Albumin/Globulin Ratio: 1.7 (ref 1.2–2.2)
Albumin: 3.9 g/dL (ref 3.5–4.8)
BUN/Creatinine Ratio: 19 (ref 12–28)
BUN: 14 mg/dL (ref 8–27)
Bilirubin Total: 0.5 mg/dL (ref 0.0–1.2)
CALCIUM: 8.6 mg/dL — AB (ref 8.7–10.3)
CO2: 22 mmol/L (ref 20–29)
CREATININE: 0.74 mg/dL (ref 0.57–1.00)
Chloride: 101 mmol/L (ref 96–106)
GFR calc Af Amer: 92 mL/min/{1.73_m2} (ref >59)
GFR, EST NON AFRICAN AMERICAN: 80 mL/min/{1.73_m2} (ref >59)
GLOBULIN, TOTAL: 2.3 g/dL (ref 1.5–4.5)
GLUCOSE: 187 mg/dL — AB (ref 65–99)
Potassium: 4.2 mmol/L (ref 3.5–5.2)
Sodium: 138 mmol/L (ref 134–144)
Total Protein: 6.2 g/dL (ref 6.0–8.5)

## 2017-02-24 LAB — VITAMIN D 25 HYDROXY (VIT D DEFICIENCY, FRACTURES): VIT D 25 HYDROXY: 30.2 ng/mL (ref 30.0–100.0)

## 2017-02-24 LAB — LIPID PANEL WITH LDL/HDL RATIO
CHOLESTEROL TOTAL: 188 mg/dL (ref 100–199)
HDL: 48 mg/dL (ref >39)
LDL Calculated: 98 mg/dL (ref 0–99)
LDl/HDL Ratio: 2 ratio (ref 0.0–3.2)
TRIGLYCERIDES: 212 mg/dL — AB (ref 0–149)
VLDL Cholesterol Cal: 42 mg/dL — ABNORMAL HIGH (ref 5–40)

## 2017-02-24 LAB — CBC WITH DIFFERENTIAL
BASOS ABS: 0 10*3/uL (ref 0.0–0.2)
Basos: 1 %
EOS (ABSOLUTE): 0.2 10*3/uL (ref 0.0–0.4)
EOS: 4 %
HEMATOCRIT: 33.9 % — AB (ref 34.0–46.6)
HEMOGLOBIN: 11.6 g/dL (ref 11.1–15.9)
IMMATURE GRANULOCYTES: 1 %
Immature Grans (Abs): 0 10*3/uL (ref 0.0–0.1)
LYMPHS ABS: 1.4 10*3/uL (ref 0.7–3.1)
Lymphs: 29 %
MCH: 30.1 pg (ref 26.6–33.0)
MCHC: 34.2 g/dL (ref 31.5–35.7)
MCV: 88 fL (ref 79–97)
MONOCYTES: 7 %
MONOS ABS: 0.4 10*3/uL (ref 0.1–0.9)
Neutrophils Absolute: 2.9 10*3/uL (ref 1.4–7.0)
Neutrophils: 58 %
RBC: 3.86 x10E6/uL (ref 3.77–5.28)
RDW: 15 % (ref 12.3–15.4)
WBC: 4.9 10*3/uL (ref 3.4–10.8)

## 2017-02-24 LAB — INSULIN, RANDOM: INSULIN: 30.7 u[IU]/mL — AB (ref 2.6–24.9)

## 2017-02-24 LAB — MAGNESIUM: MAGNESIUM: 1.8 mg/dL (ref 1.6–2.3)

## 2017-02-24 LAB — T4, FREE: Free T4: 0.9 ng/dL (ref 0.82–1.77)

## 2017-02-24 LAB — HEMOGLOBIN A1C
ESTIMATED AVERAGE GLUCOSE: 189 mg/dL
HEMOGLOBIN A1C: 8.2 % — AB (ref 4.8–5.6)

## 2017-02-24 LAB — T3: T3, Total: 153 ng/dL (ref 71–180)

## 2017-02-24 LAB — TSH: TSH: 4.81 u[IU]/mL — AB (ref 0.450–4.500)

## 2017-02-24 LAB — VITAMIN B12

## 2017-02-24 NOTE — Progress Notes (Signed)
.  Office: [phone]  /  Fax: [phone]   HPI:   Chief Complaint: [place] (MR# [id number]) is a 76 y.o. female who presents on 02/23/2017 for obesity evaluation and treatment. Current BMI is Body mass index is 35.99 kg/m.[name] Erin Roach has struggled with obesity for years and has been unsuccessful in either losing weight or maintaining long term weight loss. Erin Roach attended our information session and states she is currently in the action stage of change and ready to dedicate time achieving and maintaining a healthier weight.  Erin Roach states her family eats meals together she thinks her family will eat healthier with  her her desired weight loss is 73 lbs her heaviest weight ever was 223 lbs. she has significant food cravings issues  she snacks frequently in the evenings she wakes up frquently in the middle of the night to eat she is frequently drinking liquids with calories she frequently makes poor food choices she has problems with excessive hunger  she frequently eats larger portions than normal  she struggles with emotional eating    Fatigue Erin Roach feels her energy is lower than it should be. This has worsened with weight gain and has not worsened recently. Erin Roach admits to daytime somnolence and  admits to waking up still tired. Patient is at risk for obstructive sleep apnea. Patent has a history of symptoms of daytime fatigue. Patient generally gets 4 or 5 hours of sleep per night, and states they generally have nightime awakenings. Snoring is present. Apneic episodes are not present. Epworth Sleepiness Score is 16  Dyspnea on exertion Erin Roach notes increasing shortness of breath with exercising and seems to be worsening over time with weight gain. She notes getting out of breath sooner with activity than she used to. This has not gotten worse recently. Erin Roach denies orthopnea.  Hypertension Erin Roach is a 76 y.o. female with hypertension. She is on losartan. Erin Roach denies chest pain or headache, but admits shortness of breath on exertion. She is working weight loss to help control her blood pressure with the goal of decreasing her risk of heart attack and stroke. Erin Roach blood pressure is well controlled.  Hyperlipidemia Erin Roach has hyperlipidemia and has been trying to improve her cholesterol levels with intensive lifestyle modification including a low saturated fat diet, exercise and weight loss. She is on a statin. She denies any chest pain, claudication or myalgias.  Pre-Diabetes Erin Roach has a diagnosis of pre-diabetes based on her elevated Hgb A1c and was informed this puts her at greater risk of developing diabetes. She is not taking metformin currently and continues to work on diet and exercise to decrease risk of diabetes. She admits polyphagia but denies nausea or hypoglycemia.  B12 Deficiency Erin Roach has a diagnosis of B12 insufficiency and notes fatigue. This is not a new diagnosis. She is on a OTC supplement. Erin Roach is not a vegetarian and does not have a previous diagnosis of pernicious anemia. She does not have a history of weight loss surgery.   Vitamin D deficiency Erin Roach has a diagnosis of vitamin D deficiency. She is on a OTC supplement. Erin Roach denies nausea, vomiting or muscle weakness.  Allergic Rhinitis Erin Roach has a diagnosis of allergic rhinitis. She is on allegra but still notes rhinorrhea and conjunctivitis.   Depression Screen Erin Roach Food and Mood (modified PHQ-9) score was  Depression screen PHQ 2/9 02/23/2017  Decreased Interest 3  Down, Depressed, Hopeless 3  PHQ - 2 Score 6  Altered  sleeping 1  Tired, decreased energy 3  Change in appetite 2  Feeling bad or failure about yourself  2  Trouble concentrating 0  Moving slowly or fidgety/restless 2  Suicidal thoughts 0  PHQ-9 Score 16  Difficult doing work/chores Very difficult  Some recent data might be hidden    ALLERGIES: No Known  Allergies  MEDICATIONS: Current Outpatient Prescriptions on File Prior to Visit  Medication Sig Dispense Refill  . amitriptyline (ELAVIL) 10 MG tablet TAKE ONE TABLET BY MOUTH AT BEDTIME 30 tablet 0  . esomeprazole (NEXIUM) 20 MG packet Take 20 mg by mouth as needed.    . furosemide (LASIX) 20 MG tablet TAKE ONE TABLET BY MOUTH DAILY (Patient taking differently: TAKE TWO TABLET BY MOUTH DAILY-- takes in am) 90 tablet 3  . HYDROcodone-acetaminophen (NORCO/VICODIN) 5-325 MG tablet Take 1 tablet by mouth at bedtime. 30 tablet 0  . losartan (COZAAR) 100 MG tablet TAKE 1 TABLET BY MOUTH DAILY. (Patient taking differently: TAKE 1 TABLET BY MOUTH DAILY.--- takes in pm) 90 tablet 2  . pramipexole (MIRAPEX) 0.25 MG tablet Take 0.5 mg by mouth 2 (two) times daily. 1630  And  Bedtime    . pregabalin (LYRICA) 75 MG capsule TAKE 1 CAPSULE BY MOUTH IN AFTERNOON, AND 2 CAPSULES AT BEDTIME (Patient taking differently: Take 75 mg by mouth 2 (two) times daily. TAKE 1 CAPSULE BY MOUTH IN AFTERNOON, AND 2 CAPSULES AT BEDTIME) 270 capsule 1  . simvastatin (ZOCOR) 40 MG tablet Take 1 tablet (40 mg total) by mouth at bedtime. (Patient taking differently: Take 40 mg by mouth at bedtime. ) 90 tablet 3  . vitamin B-12 (CYANOCOBALAMIN) 1000 MCG tablet Take 0.5 tablets (500 mcg total) by mouth daily. 30 tablet 1  . [DISCONTINUED] VESICARE 10 MG tablet TAKE 1 TABLET EVERY DAY 30 tablet 0   No current facility-administered medications on file prior to visit.     PAST MEDICAL HISTORY: Past Medical History:  Diagnosis Date  . Allergic rhinitis   . Arthritis   . Asthma    very mild  . Back pain   . Bilateral lower extremity edema   . Carotid artery disease ([place])    a. Carotid duplex 03/2016 - duplex was stable, 54-62% RICA, 7-03% LICA  . Complication of anesthesia    post-op delirium  . Endometrial polyp   . Essential hypertension   . GERD (gastroesophageal reflux disease)   . Heart murmur    per pt  . History  of adenomatous polyp of colon    04-24-2016  tubular adenoma  . History of squamous cell carcinoma excision    pilonidal area second excision 02-02-2002  . HTN (hypertension)   . Hyperlipidemia   . Leg pain   . OAB (overactive bladder)   . RA (rheumatoid arthritis) ([place])   . RLS (restless legs syndrome)   . Shortness of breath on exertion   . SUI (stress urinary incontinence, female)   . Venous insufficiency    legs  . Vitamin D deficiency     PAST SURGICAL HISTORY: Past Surgical History:  Procedure Laterality Date  . APPENDECTOMY  07-02-2005   [name]   open  . CARDIAC CATHETERIZATION  01-23-2009  dr Darnell Level brodie   normal coronary arteries and lvf  . CARDIOVASCULAR STRESS TEST  03/31/2016   Low risk nuclear study w/ medium defect of mild severity in basal anterior and mid anterior location w/ no evidence ischemia or infarction/  normal LV function  and wall motion,  nuclear stress ef 71%  . CATARACT EXTRACTION W/ INTRAOCULAR LENS IMPLANT Left 09/2016  . COLONOSCOPY  last one 04-24-2016  . HYSTEROSCOPY W/D&C N/A 12/22/2016   Procedure: DILATATION AND CURETTAGE /HYSTEROSCOPY;  Surgeon: Dian Queen, MD;  Location: [facility];  Service: Gynecology;  Laterality: N/A;  . POSTERIOR LUMBAR FUSION  02/ 2018    [facility] ([place])  . SKIN CANCER EXCISION    . TONSILLECTOMY  child  . 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 TRANSTHORACIC ECHOCARDIOGRAM  01/21/2009   mild LVH, ef 40-98%, grade 1 diastolic dysfunction  . ULNAR NERVE TRANSPOSITION Left 12-20-2008  dr sypher   decompression and partial resection medial triceps fascia  . WIDE LOCAL EXCISION PILONIDAL AREA  02-02-2002  [name]   recurrent squamous cell carcinoma in situ    SOCIAL HISTORY: Social History  Substance Use Topics  . Smoking status:  Former Smoker    Packs/day: 0.50    Years: 10.00    Types: Cigarettes    Quit date: 12/10/1973  . Smokeless tobacco: Never Used  . Alcohol use Yes     Comment: occasional    FAMILY HISTORY: Family History  Problem Relation Age of Onset  . Esophageal cancer Father   . Heart disease Father        Unknown what kind  . Hyperlipidemia Father   . Hypertension Father   . Cancer Father   . Heart failure Father   . Hyperlipidemia Mother   . Hypertension Mother   . CAD Sister        35 years younger than patient - had stent. Was told that her anorexia/bulimia may have played a role  . Stroke Maternal Grandmother 66  . Diabetes Maternal Grandmother   . Heart attack Maternal Grandfather 93  . COPD Maternal Grandfather   . Heart attack Paternal Grandmother        10s  . Diabetes Unknown        MGGM    ROS: Review of Systems  Constitutional: Positive for malaise/fatigue. Negative for weight loss.       Trouble sleeping  HENT:       Decreased hearing Hay fever Rhinorrhea  Eyes:       Wear glasses or contacts (reading) Conjunctivitis   Respiratory: Positive for cough and shortness of breath (with exertion).   Cardiovascular: Negative for chest pain, orthopnea and claudication.       Calf/leg pain with walking  Gastrointestinal: Positive for heartburn. Negative for nausea and vomiting.  Musculoskeletal: Positive for back pain. Negative for myalgias.       Negative muscle weakness  Neurological: Negative for headaches.  Endo/Heme/Allergies:       Negative hypoglycemia Positive polyphagia  Psychiatric/Behavioral: Positive for depression.    PHYSICAL EXAM: Blood pressure 112/70, pulse 90, temperature 98.4 F (36.9 C), temperature source Oral, height 5\' 6"  (1.676 m), weight 223 lb (101.2 kg), SpO2 98 %. Body mass index is 35.99 kg/m. Physical Exam  Constitutional: She is oriented to person, place, and time. She appears well-developed and well-nourished.  Cardiovascular:   Murmur (grade 1/6 early systolic murmur) heard. Pulmonary/Chest: Effort normal.  Musculoskeletal: Normal range of motion.  Neurological: She is oriented to person, place, and time.  Skin: Skin is warm and dry.  Psychiatric: She has a normal mood and affect.  Vitals reviewed.   RECENT LABS AND TESTS: BMET    Component Value Date/Time   NA 138 02/23/2017 1007   K 4.2 02/23/2017 1007   CL 101 02/23/2017 1007   CO2 22 02/23/2017 1007   GLUCOSE 187 (H) 02/23/2017 1007   GLUCOSE 147 (H) 12/08/2016 1436   BUN 14 02/23/2017 1007   CREATININE 0.74 02/23/2017 1007   CALCIUM 8.6 (L) 02/23/2017 1007   GFRNONAA 80 02/23/2017 1007   GFRAA 92 02/23/2017 1007   Lab Results  Component Value Date   HGBA1C 8.2 (H) 02/23/2017   Lab Results  Component Value Date   INSULIN 30.7 (H) 02/23/2017   CBC    Component Value Date/Time   WBC 4.9 02/23/2017 1007   WBC 6.8 12/15/2016 1159   RBC 3.86 02/23/2017 1007   RBC 4.23 12/15/2016 1159   HGB 11.6 02/23/2017 1007   HCT 33.9 (L) 02/23/2017 1007   PLT 242 12/15/2016 1159   MCV 88 02/23/2017 1007   MCH 30.1 02/23/2017 1007   MCH 30.3 12/15/2016 1159   MCHC 34.2 02/23/2017 1007   MCHC 34.8 12/15/2016 1159   RDW 15.0 02/23/2017 1007   LYMPHSABS 1.4 02/23/2017 1007   MONOABS 0.5 06/04/2016 0808   EOSABS 0.2 02/23/2017 1007   BASOSABS 0.0 02/23/2017 1007   Iron/TIBC/Ferritin/ %Sat    Component Value Date/Time   IRON 46 03/19/2015 1448   IRON 49 03/19/2015 1448   TIBC 333 03/19/2015 1448   FERRITIN 47.1 03/19/2015 1448   IRONPCTSAT 15 03/19/2015 1448   Lipid Panel     Component Value Date/Time   CHOL 188 02/23/2017 1007   TRIG 212 (H) 02/23/2017 1007   TRIG 101 05/19/2006 0832   HDL 48 02/23/2017 1007   CHOLHDL 4 06/04/2016 0808   VLDL 31.4 06/04/2016 0808   LDLCALC 98 02/23/2017 1007   LDLDIRECT 141.3 09/06/2012 0907   Hepatic Function Panel     Component Value Date/Time   PROT 6.2 02/23/2017 1007   ALBUMIN 3.9  02/23/2017 1007   AST 41 (H) 02/23/2017 1007   ALT 58 (H) 02/23/2017 1007   ALKPHOS 84 02/23/2017 1007   BILITOT 0.5 02/23/2017 1007   BILIDIR 0.1 12/04/2013 1724      Component Value Date/Time   TSH 4.810 (H) 02/23/2017 1007   TSH 4.22 06/04/2016 0808   TSH 3.23 10/18/2014 1056    ECG  shows NSR with a rate of 86 BPM INDIRECT CALORIMETER done today shows a VO2 of 282 and a REE of 1962. Her calculated basal metabolic rate is 4193 thus her basal metabolic rate is better than expected.    ASSESSMENT AND PLAN: Other fatigue - Plan: EKG 12-Lead, CBC With Differential, T3, T4, free, TSH, Magnesium  Shortness of breath on exertion  Essential hypertension - Plan: Comprehensive metabolic panel, Magnesium  Other hyperlipidemia - Plan: Lipid Panel With LDL/HDL Ratio  Prediabetes - Plan: Comprehensive metabolic panel, Hemoglobin A1c, Insulin, random  B12 nutritional deficiency - Plan: Vitamin B12  Vitamin D deficiency - Plan: VITAMIN D 25 Hydroxy (Vit-D Deficiency, Fractures)  Allergic rhinitis due to other allergic trigger, unspecified seasonality - Plan: fluticasone (FLONASE) 50 MCG/ACT nasal spray  Depression screening  Class 2 severe obesity with serious comorbidity and body mass index (BMI) of 36.0 to 36.9 in adult, unspecified obesity type (HCC)  PLAN:  Fatigue Erin Roach was informed that her fatigue may be related to obesity, depression or many other causes. Labs will be ordered, and in the meanwhile  [name] has agreed to work on diet, exercise and weight loss to help with fatigue. Proper sleep hygiene was discussed including the need for 7-8 hours of quality sleep each night. A sleep study was not ordered based on symptoms and Epworth score.  Dyspnea on exertion Lidia's shortness of breath appears to be obesity related and exercise induced. She has agreed to work on weight loss and gradually increase exercise to treat her exercise induced shortness of breath. If Disa follows  our instructions and loses weight without improvement of her shortness of breath, we will plan to refer to pulmonology. We will monitor this condition regularly. Huda agrees to this plan.  Hypertension We discussed sodium restriction, working on healthy weight loss, and a regular exercise program as the means to achieve improved blood pressure control. Mikhia agreed with this plan and agreed to follow up as directed. We will continue to monitor her blood pressure as well as her progress with the above lifestyle modifications. She will continue her medications as prescribed and will watch for signs of hypotension as she continues her lifestyle modifications. We will check labs and Erin Roach agrees to follow up with our clinic in 2 weeks.  Hyperlipidemia Shanah was informed of the American Heart Association Guidelines emphasizing intensive lifestyle modifications as the first line treatment for hyperlipidemia. We discussed many lifestyle modifications today in depth, and Juanell will continue to work on decreasing saturated fats such as fatty red meat, butter and many fried foods. She will also increase vegetables and lean protein in her diet and continue to work on exercise and weight loss efforts. We will check labs and Darien agrees to follow up with our clinic in 2 weeks.  Pre-Diabetes Pessy will continue to work on weight loss, diet, exercise, and decreasing simple carbohydrates in her diet to help decrease the risk of diabetes. We dicussed metformin including benefits and risks. She was informed that eating too many simple carbohydrates or too many calories at one sitting increases the likelihood of GI side effects. Shataya declined metformin for now and a prescription was not written today. We will check labs and Erin Roach agrees to follow up with our clinic in 2 weeks as directed to monitor her progress.  B12 Deficiency Erin Roach will work on increasing B12 rich foods in her diet. B12 supplementation was not  prescribed today. We will check labs and Erin Roach agrees to follow up with our clinic in 2 weeks.  Vitamin D Deficiency Erin Roach was informed that low vitamin D levels contributes to fatigue and are associated with obesity, breast, and colon cancer. She agrees to continue taking OTC supplement and will follow up for routine testing of vitamin D, at least 2-3 times per year. She was informed of the risk of over-replacement of vitamin D and agrees to not increase her dose unless he discusses this with [name] first. We will check labs and Erin Roach agrees to follow up with our clinic in 2 weeks.  Allergic Rhinitis Erin Roach agrees to start Flonase 50 mcg 1 spray in each nostril q daily #1 with no refills. She agrees to follow up with our clinic in 2 weeks.  Depression Screen Erin Roach had a strongly positive depression screening. Depression is commonly associated with obesity and often results in emotional eating behaviors. We will monitor this closely and work on CBT to help improve the non-hunger eating patterns. Referral to Psychology may be required if no improvement is seen as she continues in our clinic.  Obesity Erin Roach is currently in  the action stage of change and her goal is to continue with weight loss efforts She has agreed to follow the Category 2 plan Vern has been instructed to work up to a goal of 150 minutes of combined cardio and strengthening exercise per week for weight loss and overall health benefits. We discussed the following Behavioral Modification Strategies today: increasing lean protein intake, decreasing simple carbohydrates  and work on meal planning and easy cooking plans  Yisel has agreed to follow up with our clinic in 2 weeks. She was informed of the importance of frequent follow up visits to maximize her success with intensive lifestyle modifications for her multiple health conditions. She was informed we would discuss her lab results at her next visit unless there is a critical issue that  needs to be addressed sooner. Sua agreed to keep her next visit at the agreed upon time to discuss these results.  I, Trixie Dredge, am acting as transcriptionist for Dennard Nip, MD  I have reviewed the above documentation for accuracy and completeness, and I agree with the above. -Dennard Nip, MD      Today's visit was # 1 out of 22.  Starting weight: 223 lbs Starting date: 02/23/17 Today's weight : 223 lbs  Today's date: 02/23/2017 Total lbs lost to date: 0 (Patients must lose 7 lbs in the first 6 months to continue with counseling)   ASK: We discussed the diagnosis of obesity with Erin Roach today and Erin Roach agreed to give [name] permission to discuss obesity behavioral modification therapy today.  ASSESS: Luise has the diagnosis of obesity and her BMI today is 36.01 Aarvi is in the action stage of change   ADVISE: Keyosha was educated on the multiple health risks of obesity as well as the benefit of weight loss to improve her health. She was advised of the need for long term treatment and the importance of lifestyle modifications.  AGREE: Multiple dietary modification options and treatment options were discussed and  Onelia agreed to follow the Category 2 plan We discussed the following Behavioral Modification Strategies today: increasing lean protein intake, decreasing simple carbohydrates, and work on meal planning and easy cooking plans

## 2017-03-02 ENCOUNTER — Ambulatory Visit (INDEPENDENT_AMBULATORY_CARE_PROVIDER_SITE_OTHER): Payer: Medicare Other | Admitting: Internal Medicine

## 2017-03-02 ENCOUNTER — Other Ambulatory Visit (INDEPENDENT_AMBULATORY_CARE_PROVIDER_SITE_OTHER): Payer: Medicare Other

## 2017-03-02 ENCOUNTER — Other Ambulatory Visit: Payer: Self-pay | Admitting: Physical Medicine & Rehabilitation

## 2017-03-02 ENCOUNTER — Encounter: Payer: Self-pay | Admitting: Internal Medicine

## 2017-03-02 VITALS — BP 132/62 | HR 95 | Temp 98.7°F | Resp 16 | Wt 223.0 lb

## 2017-03-02 DIAGNOSIS — E1122 Type 2 diabetes mellitus with diabetic chronic kidney disease: Secondary | ICD-10-CM | POA: Insufficient documentation

## 2017-03-02 DIAGNOSIS — B029 Zoster without complications: Secondary | ICD-10-CM | POA: Diagnosis not present

## 2017-03-02 DIAGNOSIS — Z23 Encounter for immunization: Secondary | ICD-10-CM

## 2017-03-02 DIAGNOSIS — G2581 Restless legs syndrome: Secondary | ICD-10-CM

## 2017-03-02 DIAGNOSIS — E119 Type 2 diabetes mellitus without complications: Secondary | ICD-10-CM | POA: Insufficient documentation

## 2017-03-02 LAB — FERRITIN: Ferritin: 218 ng/mL (ref 10.0–291.0)

## 2017-03-02 LAB — IRON: IRON: 109 ug/dL (ref 42–145)

## 2017-03-02 MED ORDER — VALACYCLOVIR HCL 1 G PO TABS: 1000.0000 mg | ORAL_TABLET | Freq: Three times a day (TID) | ORAL | 0 refills | Status: DC

## 2017-03-02 NOTE — Patient Instructions (Signed)
Take the anti-viral, valtrex as prescribed.     Shingles Shingles, which is also known as herpes zoster, is an infection that causes a painful skin rash and fluid-filled blisters. Shingles is not related to genital herpes, which is a sexually transmitted infection. Shingles only develops in people who:  Have had chickenpox.  Have received the chickenpox vaccine. (This is rare.)  What are the causes? Shingles is caused by varicella-zoster virus (VZV). This is the same virus that causes chickenpox. After exposure to VZV, the virus stays in the body in an inactive (dormant) state. Shingles develops if the virus reactivates. This can happen many years after the initial exposure to VZV. It is not known what causes this virus to reactivate. What increases the risk? People who have had chickenpox or received the chickenpox vaccine are at risk for shingles. Infection is more common in people who:  Are older than age 23.  Have a weakened defense (immune) system, such as those with HIV, AIDS, or cancer.  Are taking medicines that weaken the immune system, such as transplant medicines.  Are under great stress.  What are the signs or symptoms? Early symptoms of this condition include itching, tingling, and pain in an area on your skin. Pain may be described as burning, stabbing, or throbbing. A few days or weeks after symptoms start, a painful red rash appears, usually on one side of the body in a bandlike or beltlike pattern. The rash eventually turns into fluid-filled blisters that break open, scab over, and dry up in about 2-3 weeks. At any time during the infection, you may also develop:  A fever.  Chills.  A headache.  An upset stomach.  How is this diagnosed? This condition is diagnosed with a skin exam. Sometimes, skin or fluid samples are taken from the blisters before a diagnosis is made. These samples are examined under a microscope or sent to a lab for testing. How is this  treated? There is no specific cure for this condition. Your health care provider will probably prescribe medicines to help you manage pain, recover more quickly, and avoid long-term problems. Medicines may include:  Antiviral drugs.  Anti-inflammatory drugs.  Pain medicines.  If the area involved is on your face, you may be referred to a specialist, such as an eye doctor (ophthalmologist) or an ear, nose, and throat (ENT) doctor to help you avoid eye problems, chronic pain, or disability. Follow these instructions at home: Medicines  Take medicines only as directed by your health care provider.  Apply an anti-itch or numbing cream to the affected area as directed by your health care provider. Blister and Rash Care  Take a cool bath or apply cool compresses to the area of the rash or blisters as directed by your health care provider. This may help with pain and itching.  Keep your rash covered with a loose bandage (dressing). Wear loose-fitting clothing to help ease the pain of material rubbing against the rash.  Keep your rash and blisters clean with mild soap and cool water or as directed by your health care provider.  Check your rash every day for signs of infection. These include redness, swelling, and pain that lasts or increases.  Do not pick your blisters.  Do not scratch your rash. General instructions  Rest as directed by your health care provider.  Keep all follow-up visits as directed by your health care provider. This is important.  Until your blisters scab over, your infection can cause chickenpox  in people who have never had it or been vaccinated against it. To prevent this from happening, avoid contact with other people, especially: ? Babies. ? Pregnant women. ? Children who have eczema. ? Elderly people who have transplants. ? People who have chronic illnesses, such as leukemia or AIDS. Contact a health care provider if:  Your pain is not relieved with  prescribed medicines.  Your pain does not get better after the rash heals.  Your rash looks infected. Signs of infection include redness, swelling, and pain that lasts or increases. Get help right away if:  The rash is on your face or nose.  You have facial pain, pain around your eye area, or loss of feeling on one side of your face.  You have ear pain or you have ringing in your ear.  You have loss of taste.  Your condition gets worse. This information is not intended to replace advice given to you by your health care provider. Make sure you discuss any questions you have with your health care provider. Document Released: 04/27/2005 Document Revised: 12/22/2015 Document Reviewed: 03/08/2014 Elsevier Interactive Patient Education  2017 [employer].

## 2017-03-02 NOTE — Assessment & Plan Note (Signed)
Skin lesion suspicious for possible shingles although the pattern is not typical We will start Valtrex daily times 1 week She does have an appointment with me tomorrow and we will reevaluate We will discuss at that time the new shingles vaccine

## 2017-03-02 NOTE — Assessment & Plan Note (Addendum)
Seeing a specialist in [place]-notes in care everywhere Will check ferritin, iron levels

## 2017-03-02 NOTE — Progress Notes (Signed)
Subjective:    Patient ID: Erin Roach, female    DOB: [birth year], 76 y.o.   MRN: [id number]  HPI She is here for an acute visit.   Last night she noticed a rash on her left side.  It did itch a little and she scratched it.  She noticed one similar lesion on the right side.  She is unsure if that lesion is the same or related.  She denies any pain, numbness or tingling.  She does have some skin sensitivity.  She denies fevers.  She has had the old shingles vaccine.     Restless leg syndrome: She started seeing a specialist in [place] for her restless leg.  Some of her medications have been discontinued.  She would like to have her iron and ferritin levels rechecked.  Medications and allergies reviewed with patient and updated if appropriate.  Patient Active Problem List   Diagnosis Date Noted  . Prediabetes 02/23/2017  . Other fatigue 02/23/2017  . Shortness of breath on exertion 02/23/2017  . B12 nutritional deficiency 02/23/2017  . Vitamin D deficiency 12/08/2016  . B12 deficiency 12/08/2016  . Tubular adenoma of colon 04/29/2016  . Essential hypertension 09/12/2015  . Allergic rhinitis 07/17/2015  . Insomnia 07/17/2015  . Obese 04/10/2015  . S/P left TKA 04/09/2015  . Hyperglycemia 01/09/2015  . Osteopenia 12/04/2012  . 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 RESTLESS LEG SYNDROME 02/11/2009  . LEG EDEMA, BILATERAL 02/11/2009  . Hyperlipidemia 10/30/2008  . Urinary incontinence 10/30/2008  . Skin cancer 10/30/2008  . Asthma 11/21/2007  . Venous (peripheral) insufficiency 11/22/2006  . SNORING 11/22/2006    Current Outpatient Prescriptions on File Prior to Visit  Medication Sig Dispense Refill  . amitriptyline (ELAVIL) 10 MG tablet TAKE ONE TABLET BY MOUTH AT BEDTIME 30 tablet 0  . benzonatate (TESSALON) 100 MG capsule Take 100 mg by mouth 3 (three) times daily as needed for cough.    . Cholecalciferol (VITAMIN D-3) 1000 units CAPS Take 1 capsule by mouth daily.    [name]  DM-Doxylamine-Acetaminophen (NYQUIL COLD & FLU PO) Take by mouth.    . esomeprazole (NEXIUM) 20 MG packet Take 20 mg by mouth as needed.    . fexofenadine (ALLEGRA) 180 MG tablet Take 180 mg by mouth daily.    . fluticasone (FLONASE) 50 MCG/ACT nasal spray Place 1 spray into both nostrils daily. 16 g 0  . furosemide (LASIX) 20 MG tablet TAKE ONE TABLET BY MOUTH DAILY (Patient taking differently: TAKE TWO TABLET BY MOUTH DAILY-- takes in am) 90 tablet 3  . HYDROcodone-acetaminophen (NORCO/VICODIN) 5-325 MG tablet Take 1 tablet by mouth at bedtime. 30 tablet 0  . levocetirizine (XYZAL) 5 MG tablet Take 5 mg by mouth every evening.    [name] losartan (COZAAR) 100 MG tablet TAKE 1 TABLET BY MOUTH DAILY. (Patient taking differently: TAKE 1 TABLET BY MOUTH DAILY.--- takes in pm) 90 tablet 2  . pramipexole (MIRAPEX) 0.25 MG tablet Take 0.5 mg by mouth 2 (two) times daily. 1630  And  Bedtime    . pregabalin (LYRICA) 75 MG capsule TAKE 1 CAPSULE BY MOUTH IN AFTERNOON, AND 2 CAPSULES AT BEDTIME (Patient taking differently: Take 75 mg by mouth 2 (two) times daily. TAKE 1 CAPSULE BY MOUTH IN AFTERNOON, AND 2 CAPSULES AT BEDTIME) 270 capsule 1  . simvastatin (ZOCOR) 40 MG tablet Take 1 tablet (40 mg total) by mouth at bedtime. (Patient taking differently: Take 40 mg by mouth at bedtime. )  90 tablet 3  . vitamin B-12 (CYANOCOBALAMIN) 1000 MCG tablet Take 0.5 tablets (500 mcg total) by mouth daily. 30 tablet 1  . [DISCONTINUED] VESICARE 10 MG tablet TAKE 1 TABLET EVERY DAY 30 tablet 0   No current facility-administered medications on file prior to visit.     Past Medical History:  Diagnosis Date  . Allergic rhinitis   . Arthritis   . Asthma    very mild  . Back pain   . Bilateral lower extremity edema   . Carotid artery disease ([place])    a. Carotid duplex 03/2016 - duplex was stable, 32-99% RICA, 2-42% LICA  . Complication of anesthesia    post-op delirium  . Endometrial polyp   . Essential hypertension     . GERD (gastroesophageal reflux disease)   . Heart murmur    per pt  . History of adenomatous polyp of colon    04-24-2016  tubular adenoma  . History of squamous cell carcinoma excision    pilonidal area second excision 02-02-2002  . HTN (hypertension)   . Hyperlipidemia   . Leg pain   . OAB (overactive bladder)   . RA (rheumatoid arthritis) ([place])   . RLS (restless legs syndrome)   . Shortness of breath on exertion   . SUI (stress urinary incontinence, female)   . Venous insufficiency    legs  . Vitamin D deficiency     Past Surgical History:  Procedure Laterality Date  . APPENDECTOMY  07-02-2005   [name]   open  . CARDIAC CATHETERIZATION  01-23-2009  dr Darnell Level brodie   normal coronary arteries and lvf  . CARDIOVASCULAR STRESS TEST  03/31/2016   Low risk nuclear study w/ medium defect of mild severity in basal anterior and mid anterior location w/ no evidence ischemia or infarction/  normal LV function and wall motion,  nuclear stress ef 71%  . CATARACT EXTRACTION W/ INTRAOCULAR LENS IMPLANT Left 09/2016  . COLONOSCOPY  last one 04-24-2016  . HYSTEROSCOPY W/D&C N/A 12/22/2016   Procedure: DILATATION AND CURETTAGE /HYSTEROSCOPY;  Surgeon: Dian Queen, MD;  Location: [facility];  Service: Gynecology;  Laterality: N/A;  . POSTERIOR LUMBAR FUSION  02/ 2018    [facility] ([place])  . SKIN CANCER EXCISION    . TONSILLECTOMY  child  . 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 TRANSTHORACIC ECHOCARDIOGRAM  01/21/2009   mild LVH, ef 68-34%, grade 1 diastolic dysfunction  . ULNAR NERVE TRANSPOSITION Left 12-20-2008  dr sypher   decompression and partial resection medial triceps fascia  . WIDE LOCAL EXCISION PILONIDAL AREA  02-02-2002  [name]   recurrent squamous cell carcinoma in situ    Social History    Social History  . Marital status: Married    Spouse name: Clare Gandy  . Number of children: 2  . Years of education: N/A   Occupational History  . retired Pharmacist, hospital    Social History Main Topics  . Smoking status: Former Smoker    Packs/day: 0.50    Years: 10.00    Types: Cigarettes    Quit date: 12/10/1973  . Smokeless tobacco: Never Used  . Alcohol use Yes     Comment: occasional  . Drug use: No  . Sexual activity: Not Asked   Other Topics Concern  . None   Social History Narrative  2 children ages 61,35   Regular exercise no    Family History  Problem Relation Age of Onset  . Esophageal cancer Father   . Heart disease Father        Unknown what kind  . Hyperlipidemia Father   . Hypertension Father   . Cancer Father   . Heart failure Father   . Hyperlipidemia Mother   . Hypertension Mother   . CAD Sister        52 years younger than patient - had stent. Was told that her anorexia/bulimia may have played a role  . Stroke Maternal Grandmother 66  . Diabetes Maternal Grandmother   . Heart attack Maternal Grandfather 93  . COPD Maternal Grandfather   . Heart attack Paternal Grandmother        27s  . Diabetes Unknown        MGGM    Review of Systems  Constitutional: Negative for chills, fatigue and fever.  Musculoskeletal: Negative for myalgias.  Skin: Positive for rash. Negative for color change and wound.       ? Increased skin sensitivity  Neurological: Negative for weakness, numbness and headaches.       Objective:   Vitals:   03/02/17 0942  BP: 132/62  Pulse: 95  Resp: 16  Temp: 98.7 F (37.1 C)  SpO2: 97%   Filed Weights   03/02/17 0942  Weight: 223 lb (101.2 kg)   Body mass index is 35.99 kg/m.  Wt Readings from Last 3 Encounters:  03/02/17 223 lb (101.2 kg)  02/23/17 223 lb (101.2 kg)  12/22/16 216 lb 8 oz (98.2 kg)     Physical Exam  Constitutional: She appears well-developed and well-nourished. No distress.  HENT:  Head:  Normocephalic and atraumatic.  Musculoskeletal: She exhibits no edema.  Neurological:  Slightly increased sensitivity of skin near skin lesions  Skin: Skin is warm and dry. She is not diaphoretic.  Cluster of excoriations left lateral abdomen-she states she scratched these last night, similar lesion on the right side with cluster of surrounding very tiny blisters; no other rash or erythema, no discharge          Assessment & Plan:   See Problem List for Assessment and Plan of chronic medical problems.

## 2017-03-03 ENCOUNTER — Ambulatory Visit (INDEPENDENT_AMBULATORY_CARE_PROVIDER_SITE_OTHER): Payer: Medicare Other | Admitting: Internal Medicine

## 2017-03-03 ENCOUNTER — Encounter: Payer: Self-pay | Admitting: Internal Medicine

## 2017-03-03 VITALS — BP 126/74 | HR 95 | Temp 98.3°F | Resp 16 | Wt 221.0 lb

## 2017-03-03 DIAGNOSIS — I872 Venous insufficiency (chronic) (peripheral): Secondary | ICD-10-CM | POA: Diagnosis not present

## 2017-03-03 DIAGNOSIS — G2581 Restless legs syndrome: Secondary | ICD-10-CM

## 2017-03-03 DIAGNOSIS — E7800 Pure hypercholesterolemia, unspecified: Secondary | ICD-10-CM

## 2017-03-03 DIAGNOSIS — I10 Essential (primary) hypertension: Secondary | ICD-10-CM

## 2017-03-03 DIAGNOSIS — E119 Type 2 diabetes mellitus without complications: Secondary | ICD-10-CM

## 2017-03-03 DIAGNOSIS — R600 Localized edema: Secondary | ICD-10-CM | POA: Diagnosis not present

## 2017-03-03 MED ORDER — TRIAMCINOLONE ACETONIDE 0.5 % EX OINT: 1.0000 "application " | TOPICAL_OINTMENT | Freq: Two times a day (BID) | CUTANEOUS | 0 refills | Status: DC

## 2017-03-03 MED ORDER — BLOOD GLUCOSE MONITOR KIT: PACK | 0 refills | Status: DC

## 2017-03-03 NOTE — Assessment & Plan Note (Signed)
Taking lasix daily Has venous insuff and lyrica is likely contributing Continue lasix continue compression socks Working on weight loss

## 2017-03-03 NOTE — Assessment & Plan Note (Signed)
Lipid panel controlled Continue statin

## 2017-03-03 NOTE — Assessment & Plan Note (Addendum)
Seeing a specialist in [place] levels good Continue current medication for now

## 2017-03-03 NOTE — Assessment & Plan Note (Signed)
New diagnosis Discussed diabetes  - discussed importance of low sugar/carb diet, regular exercise and weight loss Advised yearly eye exam Foot exam done today rx for glucometer given Discussed starting metformin, which I think she should be on, but is following with Dr Leafy Ro and I am not sure if she wanted to wait 2 weeks prior to starting medication F/u in 6 months

## 2017-03-03 NOTE — Patient Instructions (Addendum)
A prescription for a glucometer was given.    Medications reviewed and updated.  No changes recommended at this time.   Please followup in 6 months     Diabetes Mellitus and Exercise Exercising regularly is important for your overall health, especially when you have diabetes (diabetes mellitus). Exercising is not only about losing weight. It has many health benefits, such as increasing muscle strength and bone density and reducing body fat and stress. This leads to improved fitness, flexibility, and endurance, all of which result in better overall health. Exercise has additional benefits for people with diabetes, including:  Reducing appetite.  Helping to lower and control blood glucose.  Lowering blood pressure.  Helping to control amounts of fatty substances (lipids) in the blood, such as cholesterol and triglycerides.  Helping the body to respond better to insulin (improving insulin sensitivity).  Reducing how much insulin the body needs.  Decreasing the risk for heart disease by: ? Lowering cholesterol and triglyceride levels. ? Increasing the levels of good cholesterol. ? Lowering blood glucose levels.  What is my activity plan? Your health care provider or certified diabetes educator can help you make a plan for the type and frequency of exercise (activity plan) that works for you. Make sure that you:  Do at least 150 minutes of moderate-intensity or vigorous-intensity exercise each week. This could be brisk walking, biking, or water aerobics. ? Do stretching and strength exercises, such as yoga or weightlifting, at least 2 times a week. ? Spread out your activity over at least 3 days of the week.  Get some form of physical activity every day. ? Do not go more than 2 days in a row without some kind of physical activity. ? Avoid being inactive for more than 90 minutes at a time. Take frequent breaks to walk or stretch.  Choose a type of exercise or activity that you  enjoy, and set realistic goals.  Start slowly, and gradually increase the intensity of your exercise over time.  What do I need to know about managing my diabetes?  Check your blood glucose before and after exercising. ? If your blood glucose is higher than 240 mg/dL (13.3 mmol/L) before you exercise, check your urine for ketones. If you have ketones in your urine, do not exercise until your blood glucose returns to normal.  Know the symptoms of low blood glucose (hypoglycemia) and how to treat it. Your risk for hypoglycemia increases during and after exercise. Common symptoms of hypoglycemia can include: ? Hunger. ? Anxiety. ? Sweating and feeling clammy. ? Confusion. ? Dizziness or feeling light-headed. ? Increased heart rate or palpitations. ? Blurry vision. ? Tingling or numbness around the mouth, lips, or tongue. ? Tremors or shakes. ? Irritability.  Keep a rapid-acting carbohydrate snack available before, during, and after exercise to help prevent or treat hypoglycemia.  Avoid injecting insulin into areas of the body that are going to be exercised. For example, avoid injecting insulin into: ? The arms, when playing tennis. ? The legs, when jogging.  Keep records of your exercise habits. Doing this can help you and your health care provider adjust your diabetes management plan as needed. Write down: ? Food that you eat before and after you exercise. ? Blood glucose levels before and after you exercise. ? The type and amount of exercise you have done. ? When your insulin is expected to peak, if you use insulin. Avoid exercising at times when your insulin is peaking.  When you start  a new exercise or activity, work with your health care provider to make sure the activity is safe for you, and to adjust your insulin, medicines, or food intake as needed.  Drink plenty of water while you exercise to prevent dehydration or heat stroke. Drink enough fluid to keep your urine clear or  pale yellow. This information is not intended to replace advice given to you by your health care provider. Make sure you discuss any questions you have with your health care provider. Document Released: 07/18/2003 Document Revised: 11/15/2015 Document Reviewed: 10/07/2015 Elsevier Interactive Patient Education  2018 [employer].

## 2017-03-03 NOTE — Assessment & Plan Note (Signed)
Stable Continue lasix Unable to come off lyrica due to pain Compression socks Weight loss Increase exercise

## 2017-03-03 NOTE — Progress Notes (Signed)
Subjective:    Patient ID: Erin Roach, female    DOB: [birth year], 76 y.o.   MRN: [id number]  HPI The patient is here for follow up.  Diabetes: She had recent blood work done and this is a new diagnosis.  She has been prediabetic up until now.  She is following up with health and wellness clinic and is working on weight loss.  There has been a change in her activity and weight over the past three months due to increased back and knee pain.  She was waking up in the middle of the night and eating, which she has stopped.   Hypertension: She is taking her medication daily. She is compliant with a low sodium diet.  She denies chest pain, palpitations, edema, shortness of breath and regular headaches. She is exercising some, but is limited by her knee and back pain.      Hyperlipidemia: She is taking her medication daily. She is compliant with a low fat/cholesterol diet. She is exercising some. She denies myalgias.   Restless leg syndrome: She just started seeing a specialist in [place].  We did check her iron/ferritin levels yesterday and they are normal.  Some of her medications were discontinued.  She is continuing to use the relaxis pad at night.  She is taking hydrocodone at bedtime and Mirapex twice daily.  Leg edema:  She is taking Lasix daily.    Medications and allergies reviewed with patient and updated if appropriate.  Patient Active Problem List   Diagnosis Date Noted  . Diabetes ([place]) 03/02/2017  . Herpes zoster without complication 71/69/6789  . Other fatigue 02/23/2017  . Shortness of breath on exertion 02/23/2017  . B12 nutritional deficiency 02/23/2017  . Vitamin D deficiency 12/08/2016  . Tubular adenoma of colon 04/29/2016  . Essential hypertension 09/12/2015  . Allergic rhinitis 07/17/2015  . Insomnia 07/17/2015  . Obese 04/10/2015  . S/P left TKA 04/09/2015  . Osteopenia 12/04/2012  . 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 RESTLESS LEG SYNDROME 02/11/2009  . LEG  EDEMA, BILATERAL 02/11/2009  . Hyperlipidemia 10/30/2008  . Urinary incontinence 10/30/2008  . Skin cancer 10/30/2008  . Asthma 11/21/2007  . Venous (peripheral) insufficiency 11/22/2006  . SNORING 11/22/2006    Current Outpatient Prescriptions on File Prior to Visit  Medication Sig Dispense Refill  . amitriptyline (ELAVIL) 10 MG tablet TAKE ONE TABLET BY MOUTH AT BEDTIME 30 tablet 0  . Cholecalciferol (VITAMIN D-3) 1000 units CAPS Take 1 capsule by mouth daily.    . fluticasone (FLONASE) 50 MCG/ACT nasal spray Place 1 spray into both nostrils daily. 16 g 0  . furosemide (LASIX) 20 MG tablet TAKE ONE TABLET BY MOUTH DAILY (Patient taking differently: TAKE TWO TABLET BY MOUTH DAILY-- takes in am) 90 tablet 3  . HYDROcodone-acetaminophen (NORCO/VICODIN) 5-325 MG tablet Take 1 tablet by mouth at bedtime. 30 tablet 0  . losartan (COZAAR) 100 MG tablet TAKE 1 TABLET BY MOUTH DAILY. (Patient taking differently: TAKE 1 TABLET BY MOUTH DAILY.--- takes in pm) 90 tablet 2  . pramipexole (MIRAPEX) 0.25 MG tablet Take 0.5 mg by mouth 2 (two) times daily. 1630  And  Bedtime    . pregabalin (LYRICA) 75 MG capsule TAKE 1 CAPSULE BY MOUTH IN AFTERNOON, AND 2 CAPSULES AT BEDTIME (Patient taking differently: Take 75 mg by mouth 2 (two) times daily. TAKE 1 CAPSULE BY MOUTH IN AFTERNOON, AND 2 CAPSULES AT BEDTIME) 270 capsule 1  . simvastatin (ZOCOR) 40 MG tablet  Take 1 tablet (40 mg total) by mouth at bedtime. (Patient taking differently: Take 40 mg by mouth at bedtime. ) 90 tablet 3  . valACYclovir (VALTREX) 1000 MG tablet Take 1 tablet (1,000 mg total) by mouth 3 (three) times daily. 21 tablet 0  . vitamin B-12 (CYANOCOBALAMIN) 1000 MCG tablet Take 0.5 tablets (500 mcg total) by mouth daily. 30 tablet 1  . [DISCONTINUED] VESICARE 10 MG tablet TAKE 1 TABLET EVERY DAY 30 tablet 0   No current facility-administered medications on file prior to visit.     Past Medical History:  Diagnosis Date  . Allergic  rhinitis   . Arthritis   . Asthma    very mild  . Back pain   . Bilateral lower extremity edema   . Carotid artery disease ([place])    a. Carotid duplex 03/2016 - duplex was stable, 16-10% RICA, 9-60% LICA  . Complication of anesthesia    post-op delirium  . Endometrial polyp   . Essential hypertension   . GERD (gastroesophageal reflux disease)   . Heart murmur    per pt  . History of adenomatous polyp of colon    04-24-2016  tubular adenoma  . History of squamous cell carcinoma excision    pilonidal area second excision 02-02-2002  . HTN (hypertension)   . Hyperlipidemia   . Leg pain   . OAB (overactive bladder)   . RA (rheumatoid arthritis) ([place])   . RLS (restless legs syndrome)   . Shortness of breath on exertion   . SUI (stress urinary incontinence, female)   . Venous insufficiency    legs  . Vitamin D deficiency     Past Surgical History:  Procedure Laterality Date  . APPENDECTOMY  07-02-2005   [name]   open  . CARDIAC CATHETERIZATION  01-23-2009  dr Darnell Level brodie   normal coronary arteries and lvf  . CARDIOVASCULAR STRESS TEST  03/31/2016   Low risk nuclear study w/ medium defect of mild severity in basal anterior and mid anterior location w/ no evidence ischemia or infarction/  normal LV function and wall motion,  nuclear stress ef 71%  . CATARACT EXTRACTION W/ INTRAOCULAR LENS IMPLANT Left 09/2016  . COLONOSCOPY  last one 04-24-2016  . HYSTEROSCOPY W/D&C N/A 12/22/2016   Procedure: DILATATION AND CURETTAGE /HYSTEROSCOPY;  Surgeon: Dian Queen, MD;  Location[facility];  Service: Gynecology;  Laterality: N/A;  . POSTERIOR LUMBAR FUSION  02/ 2018    [facility] ([place], [place])  . SKIN CANCER EXCISION    . TONSILLECTOMY  child  . 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 TRANSTHORACIC ECHOCARDIOGRAM   01/21/2009   mild LVH, ef 45-40%, grade 1 diastolic dysfunction  . ULNAR NERVE TRANSPOSITION Left 12-20-2008  dr sypher   decompression and partial resection medial triceps fascia  . WIDE LOCAL EXCISION PILONIDAL AREA  02-02-2002  [name]   recurrent squamous cell carcinoma in situ    Social History   Social History  . Marital status: Married    Spouse name: Clare Gandy  . Number of children: 2  . Years of education: N/A   Occupational History  . retired Pharmacist, hospital    Social History Main Topics  . Smoking status: Former Smoker    Packs/day: 0.50    Years: 10.00    Types: Cigarettes    Quit date: 12/10/1973  .  Smokeless tobacco: Never Used  . Alcohol use Yes     Comment: occasional  . Drug use: No  . Sexual activity: Not on file   Other Topics Concern  . Not on file   Social History Narrative   2 children ages 12,35   Regular exercise no    Family History  Problem Relation Age of Onset  . Esophageal cancer Father   . Heart disease Father        Unknown what kind  . Hyperlipidemia Father   . Hypertension Father   . Cancer Father   . Heart failure Father   . Hyperlipidemia Mother   . Hypertension Mother   . CAD Sister        42 years younger than patient - had stent. Was told that her anorexia/bulimia may have played a role  . Stroke Maternal Grandmother 66  . Diabetes Maternal Grandmother   . Heart attack Maternal Grandfather 93  . COPD Maternal Grandfather   . Heart attack Paternal Grandmother        37s  . Diabetes Unknown        MGGM    Review of Systems  Constitutional: Positive for fatigue. Negative for chills and fever.  Respiratory: Positive for cough (occasionally) and shortness of breath (sometimes with walking, but has not been walking). Negative for wheezing.   Cardiovascular: Positive for leg swelling. Negative for chest pain and palpitations.  Neurological: Negative for light-headedness and headaches.       Objective:   Vitals:   03/03/17  1359  BP: 126/74  Pulse: 95  Resp: 16  Temp: 98.3 F (36.8 C)  SpO2: 97%   Wt Readings from Last 3 Encounters:  03/03/17 221 lb (100.2 kg)  03/02/17 223 lb (101.2 kg)  02/23/17 223 lb (101.2 kg)   Body mass index is 35.67 kg/m.   Physical Exam    Constitutional: Appears well-developed and well-nourished. No distress.  HENT:  Head: Normocephalic and atraumatic.  Neck: Neck supple. No tracheal deviation present. No thyromegaly present.  No cervical lymphadenopathy Cardiovascular: Normal rate, regular rhythm and normal heart sounds.   No murmur heard. No carotid bruit .  2+ b/l LE edema Pulmonary/Chest: Effort normal and breath sounds normal. No respiratory distress. No has no wheezes. No rales.  Skin: Skin is warm and dry. Not diaphoretic.  Psychiatric: Normal mood and affect. Behavior is normal.      Assessment & Plan:    See Problem List for Assessment and Plan of chronic medical problems.

## 2017-03-03 NOTE — Assessment & Plan Note (Signed)
BP Readings from Last 3 Encounters:  03/03/17 126/74  03/02/17 132/62  02/23/17 112/70   BP well controlled Current regimen effective and well tolerated Continue current medications at current doses

## 2017-03-05 ENCOUNTER — Other Ambulatory Visit: Payer: Self-pay | Admitting: Physical Medicine & Rehabilitation

## 2017-03-05 ENCOUNTER — Other Ambulatory Visit: Payer: Self-pay | Admitting: Internal Medicine

## 2017-03-05 NOTE — Telephone Encounter (Signed)
Not on current med list. I did not see in last OV note that this was stopped

## 2017-03-05 NOTE — Telephone Encounter (Signed)
I can't remember

## 2017-03-09 ENCOUNTER — Ambulatory Visit (INDEPENDENT_AMBULATORY_CARE_PROVIDER_SITE_OTHER): Payer: Medicare Other | Admitting: Family Medicine

## 2017-03-09 ENCOUNTER — Encounter (INDEPENDENT_AMBULATORY_CARE_PROVIDER_SITE_OTHER): Payer: Self-pay | Admitting: Family Medicine

## 2017-03-09 VITALS — BP 148/82 | HR 94 | Temp 98.2°F | Ht 66.0 in | Wt 213.0 lb

## 2017-03-09 DIAGNOSIS — E119 Type 2 diabetes mellitus without complications: Secondary | ICD-10-CM | POA: Diagnosis not present

## 2017-03-09 DIAGNOSIS — Z6834 Body mass index (BMI) 34.0-34.9, adult: Secondary | ICD-10-CM | POA: Diagnosis not present

## 2017-03-09 DIAGNOSIS — E669 Obesity, unspecified: Secondary | ICD-10-CM

## 2017-03-09 DIAGNOSIS — E559 Vitamin D deficiency, unspecified: Secondary | ICD-10-CM | POA: Diagnosis not present

## 2017-03-09 MED ORDER — METFORMIN HCL 500 MG PO TABS: 500.0000 mg | ORAL_TABLET | Freq: Every day | ORAL | 0 refills | Status: DC

## 2017-03-09 MED ORDER — VITAMIN D 50 MCG (2000 UT) PO TABS: 2000.0000 [IU] | ORAL_TABLET | Freq: Every day | ORAL | Status: DC

## 2017-03-09 NOTE — Progress Notes (Signed)
Office: [phone]  /  Fax: [phone]   HPI:   Chief Complaint: OBESITY Erin Roach is here to discuss her progress with her obesity treatment plan. Erin Roach is on the Category 2 plan and is following her eating plan approximately 93-95 % of the time. Erin Roach states Erin Roach is exercising 0 minutes 0 times per week. Erin Roach did very well on the Category 2 plan but Erin Roach struggled to eat all of her protein.  Her weight is 213 lb (96.6 kg) today and has had a weight loss of 10 pounds over a period of 2 weeks since her last visit. Erin Roach has lost 10 lbs since starting treatment with [name].  Vitamin D deficiency Erin Roach has a diagnosis of vitamin D deficiency. Erin Roach is on OTC Vit D 1,000 IU, but Erin Roach is not yet at goal and Erin Roach denies nausea, vomiting or muscle weakness.  Diabetes II Erin Roach has a new diagnosis of diabetes type II. Erin Roach states Erin Roach was given a glucometer but can not figure out how to use it. Erin Roach denies any hypoglycemic episodes. Last A1c was 8.2 on 02/23/17. Erin Roach has been working on intensive lifestyle modifications including diet, exercise, and weight loss to help control her blood glucose levels.  ALLERGIES: No Known Allergies  MEDICATIONS: Current Outpatient Prescriptions on File Prior to Visit  Medication Sig Dispense Refill  . amitriptyline (ELAVIL) 10 MG tablet TAKE ONE TABLET BY MOUTH AT BEDTIME 30 tablet 0  . blood glucose meter kit and supplies KIT Dispense based on patient and insurance preference. Use up to four times daily as directed. (FOR E11.9). 1 each 0  . fluticasone (FLONASE) 50 MCG/ACT nasal spray Place 1 spray into both nostrils daily. 16 g 0  . furosemide (LASIX) 20 MG tablet TAKE ONE TABLET BY MOUTH DAILY (Patient taking differently: TAKE TWO TABLET BY MOUTH DAILY-- takes in am) 90 tablet 3  . HYDROcodone-acetaminophen (NORCO/VICODIN) 5-325 MG tablet Take 1 tablet by mouth at bedtime. 30 tablet 0  . losartan (COZAAR) 100 MG tablet TAKE ONE TABLET BY MOUTH DAILY 90 tablet 1  .  pramipexole (MIRAPEX) 0.25 MG tablet Take 0.5 mg by mouth 2 (two) times daily. 1630  And  Bedtime    . pregabalin (LYRICA) 75 MG capsule TAKE 1 CAPSULE BY MOUTH IN AFTERNOON, AND 2 CAPSULES AT BEDTIME (Patient taking differently: Take 75 mg by mouth 2 (two) times daily. TAKE 1 CAPSULE BY MOUTH IN AFTERNOON, AND 2 CAPSULES AT BEDTIME) 270 capsule 1  . simvastatin (ZOCOR) 40 MG tablet TAKE ONE TABLET BY MOUTH AT BEDTIME 90 tablet 1  . vitamin B-12 (CYANOCOBALAMIN) 1000 MCG tablet Take 0.5 tablets (500 mcg total) by mouth daily. 30 tablet 1  . [DISCONTINUED] VESICARE 10 MG tablet TAKE 1 TABLET EVERY DAY 30 tablet 0   No current facility-administered medications on file prior to visit.     PAST MEDICAL HISTORY: Past Medical History:  Diagnosis Date  . Allergic rhinitis   . Arthritis   . Asthma    very mild  . Back pain   . Bilateral lower extremity edema   . Carotid artery disease ([place])    a. Carotid duplex 03/2016 - duplex was stable, 73-41% RICA, 9-37% LICA  . Complication of anesthesia    post-op delirium  . Endometrial polyp   . Essential hypertension   . GERD (gastroesophageal reflux disease)   . Heart murmur    per pt  . History of adenomatous polyp of colon    04-24-2016  tubular  adenoma  . History of squamous cell carcinoma excision    pilonidal area second excision 02-02-2002  . HTN (hypertension)   . Hyperlipidemia   . Leg pain   . OAB (overactive bladder)   . RA (rheumatoid arthritis) ([place])   . RLS (restless legs syndrome)   . Shortness of breath on exertion   . SUI (stress urinary incontinence, female)   . Venous insufficiency    legs  . Vitamin D deficiency     PAST SURGICAL HISTORY: Past Surgical History:  Procedure Laterality Date  . APPENDECTOMY  07-02-2005   [name]   open  . CARDIAC CATHETERIZATION  01-23-2009  dr Darnell Level brodie   normal coronary arteries and lvf  . CARDIOVASCULAR STRESS TEST  03/31/2016   Low risk nuclear study w/ medium defect of  mild severity in basal anterior and mid anterior location w/ no evidence ischemia or infarction/  normal LV function and wall motion,  nuclear stress ef 71%  . CATARACT EXTRACTION W/ INTRAOCULAR LENS IMPLANT Left 09/2016  . COLONOSCOPY  last one 04-24-2016  . HYSTEROSCOPY W/D&C N/A 12/22/2016   Procedure: DILATATION AND CURETTAGE /HYSTEROSCOPY;  Surgeon: Dian Queen, MD;  Location: [facility];  Service: Gynecology;  Laterality: N/A;  . POSTERIOR LUMBAR FUSION  02/ 2018    [facility] ([place], [place])  . SKIN CANCER EXCISION    . TONSILLECTOMY  child  . 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 TRANSTHORACIC ECHOCARDIOGRAM  01/21/2009   mild LVH, ef 42-87%, grade 1 diastolic dysfunction  . ULNAR NERVE TRANSPOSITION Left 12-20-2008  dr sypher   decompression and partial resection medial triceps fascia  . WIDE LOCAL EXCISION PILONIDAL AREA  02-02-2002  [name]   recurrent squamous cell carcinoma in situ    SOCIAL HISTORY: Social History  Substance Use Topics  . Smoking status: Former Smoker    Packs/day: 0.50    Years: 10.00    Types: Cigarettes    Quit date: 12/10/1973  . Smokeless tobacco: Never Used  . Alcohol use Yes     Comment: occasional    FAMILY HISTORY: Family History  Problem Relation Age of Onset  . Esophageal cancer Father   . Heart disease Father        Unknown what kind  . Hyperlipidemia Father   . Hypertension Father   . Cancer Father   . Heart failure Father   . Hyperlipidemia Mother   . Hypertension Mother   . CAD Sister        93 years younger than patient - had stent. Was told that her anorexia/bulimia may have played a role  . Stroke Maternal Grandmother 66  . Diabetes Maternal Grandmother   . Heart attack Maternal Grandfather 93  . COPD Maternal Grandfather   . Heart attack Paternal  Grandmother        84s  . Diabetes Unknown        MGGM    ROS: Review of Systems  Constitutional: Positive for weight loss.  Gastrointestinal: Negative for nausea and vomiting.  Musculoskeletal:       Negative muscle weakness  Endo/Heme/Allergies:       Negative hypoglycemia    PHYSICAL EXAM: Blood pressure (!) 148/82, pulse 94, temperature 98.2 F (36.8 C), temperature source Oral, height '5\' 6"'  (1.676 m), weight 213 lb (96.6 kg), SpO2 99 %.  Body mass index is 34.38 kg/m. Physical Exam  Constitutional: Erin Roach is oriented to person, place, and time. Erin Roach appears well-developed and well-nourished.  Cardiovascular: Normal rate.   Pulmonary/Chest: Effort normal.  Musculoskeletal: Normal range of motion.  Neurological: Erin Roach is oriented to person, place, and time.  Skin: Skin is warm and dry.  Psychiatric: Erin Roach has a normal mood and affect. Her behavior is normal.  Vitals reviewed.   RECENT LABS AND TESTS: BMET    Component Value Date/Time   NA 138 02/23/2017 1007   K 4.2 02/23/2017 1007   CL 101 02/23/2017 1007   CO2 22 02/23/2017 1007   GLUCOSE 187 (H) 02/23/2017 1007   GLUCOSE 147 (H) 12/08/2016 1436   BUN 14 02/23/2017 1007   CREATININE 0.74 02/23/2017 1007   CALCIUM 8.6 (L) 02/23/2017 1007   GFRNONAA 80 02/23/2017 1007   GFRAA 92 02/23/2017 1007   Lab Results  Component Value Date   HGBA1C 8.2 (H) 02/23/2017   HGBA1C 5.6 12/08/2016   HGBA1C 5.3 06/04/2016   HGBA1C 5.2 01/08/2015   HGBA1C 5.5 08/22/2010   Lab Results  Component Value Date   INSULIN 30.7 (H) 02/23/2017   CBC    Component Value Date/Time   WBC 4.9 02/23/2017 1007   WBC 6.8 12/15/2016 1159   RBC 3.86 02/23/2017 1007   RBC 4.23 12/15/2016 1159   HGB 11.6 02/23/2017 1007   HCT 33.9 (L) 02/23/2017 1007   PLT 242 12/15/2016 1159   MCV 88 02/23/2017 1007   MCH 30.1 02/23/2017 1007   MCH 30.3 12/15/2016 1159   MCHC 34.2 02/23/2017 1007   MCHC 34.8 12/15/2016 1159   RDW 15.0 02/23/2017 1007    LYMPHSABS 1.4 02/23/2017 1007   MONOABS 0.5 06/04/2016 0808   EOSABS 0.2 02/23/2017 1007   BASOSABS 0.0 02/23/2017 1007   Iron/TIBC/Ferritin/ %Sat    Component Value Date/Time   IRON 109 03/02/2017 1035   TIBC 333 03/19/2015 1448   FERRITIN 218.0 03/02/2017 1035   IRONPCTSAT 15 03/19/2015 1448   Lipid Panel     Component Value Date/Time   CHOL 188 02/23/2017 1007   TRIG 212 (H) 02/23/2017 1007   TRIG 101 05/19/2006 0832   HDL 48 02/23/2017 1007   CHOLHDL 4 06/04/2016 0808   VLDL 31.4 06/04/2016 0808   LDLCALC 98 02/23/2017 1007   LDLDIRECT 141.3 09/06/2012 0907   Hepatic Function Panel     Component Value Date/Time   PROT 6.2 02/23/2017 1007   ALBUMIN 3.9 02/23/2017 1007   AST 41 (H) 02/23/2017 1007   ALT 58 (H) 02/23/2017 1007   ALKPHOS 84 02/23/2017 1007   BILITOT 0.5 02/23/2017 1007   BILIDIR 0.1 12/04/2013 1724      Component Value Date/Time   TSH 4.810 (H) 02/23/2017 1007   TSH 4.22 06/04/2016 0808   TSH 3.23 10/18/2014 1056    ASSESSMENT AND PLAN: Vitamin D deficiency - Plan: Cholecalciferol (VITAMIN D) 2000 units tablet  Type 2 diabetes mellitus without complication, without long-term current use of insulin (Erin Roach) - Plan: metFORMIN (GLUCOPHAGE) 500 MG tablet  Class 1 obesity with serious comorbidity and body mass index (BMI) of 34.0 to 34.9 in adult, unspecified obesity type  PLAN:  Vitamin D Deficiency Erin Roach was informed that low vitamin D levels contributes to fatigue and are associated with obesity, breast, and colon cancer. Erin Roach agrees to increase OTC Vit D to 2,000 IU q daily and will follow up for routine testing of vitamin D, at least  2-3 times per year. Erin Roach was informed of the risk of over-replacement of vitamin D and agrees to not increase her dose unless he discusses this with [name] first. Well will recheck labs in 3 months and Erin Roach agrees to follow up with our clinic in 1 and a half weeks.  Diabetes II Erin Roach has been given extensive diabetes  education by myself today including ideal fasting and post-prandial blood glucose readings, individual ideal Hgb A1c goals  and hypoglycemia prevention. We discussed the importance of good blood sugar control to decrease the likelihood of diabetic complications such as nephropathy, neuropathy, limb loss, blindness, coronary artery disease, and death. We discussed the importance of intensive lifestyle modification including diet, exercise and weight loss as the first line treatment for diabetes. Mayda agrees to start metformin 500 mg q AM #30 with no refills. Erin Roach agrees to follow up with our clinic in 1 and a half weeks.  Obesity Erin Roach is currently in the action stage of change. As such, her goal is to continue with weight loss efforts Erin Roach has agreed to follow the Category 2 plan Erin Roach has been instructed to work up to a goal of 150 minutes of combined cardio and strengthening exercise per week for weight loss and overall health benefits. We discussed the following Behavioral Modification Strategies today: increasing lean protein intake, decreasing simple carbohydrates, work on meal planning and easy cooking plans, keeping healthy foods in the home, and keep a strict food journal   Erin Roach has agreed to follow up with our clinic in 1 and a half weeks. Erin Roach was informed of the importance of frequent follow up visits to maximize her success with intensive lifestyle modifications for her multiple health conditions.  I, Erin Roach, am acting as transcriptionist for Erin Nip, MD  I have reviewed the above documentation for accuracy and completeness, and I agree with the above. -Erin Nip, MD      Today's visit was # 2 out of 16.  Starting weight: 223 lbs Starting date: 02/23/17 Today's weight : 213 lbs  Today's date: 03/09/2017 Total lbs lost to date: 10 (Patients must lose 7 lbs in the first 6 months to continue with counseling)   ASK: We discussed the diagnosis of obesity with  Erin Roach today and Erin Roach agreed to give [name] permission to discuss obesity behavioral modification therapy today.  ASSESS: Erin Roach has the diagnosis of obesity and her BMI today is 34.4 Erin Roach is in the action stage of change   ADVISE: Erin Roach was educated on the multiple health risks of obesity as well as the benefit of weight loss to improve her health. Erin Roach was advised of the need for long term treatment and the importance of lifestyle modifications.  AGREE: Multiple dietary modification options and treatment options were discussed and  Erin Roach agreed to follow the Category 2 plan We discussed the following Behavioral Modification Strategies today: increasing lean protein intake, decreasing simple carbohydrates, work on meal planning and easy cooking plans, keeping healthy foods in the home, and keep a strict food journal

## 2017-03-10 ENCOUNTER — Other Ambulatory Visit: Payer: Self-pay | Admitting: Emergency Medicine

## 2017-03-10 ENCOUNTER — Encounter: Payer: Self-pay | Admitting: Internal Medicine

## 2017-03-10 MED ORDER — PREGABALIN 75 MG PO CAPS: ORAL_CAPSULE | ORAL | 1 refills | Status: DC

## 2017-03-10 NOTE — Telephone Encounter (Signed)
RX faxed to [facility] 

## 2017-03-10 NOTE — Telephone Encounter (Signed)
[place] Controlled Substance Database checked. Last filled on 12/08/16

## 2017-03-16 ENCOUNTER — Ambulatory Visit (INDEPENDENT_AMBULATORY_CARE_PROVIDER_SITE_OTHER): Payer: Medicare Other | Admitting: Family Medicine

## 2017-03-16 VITALS — BP 129/74 | HR 90 | Temp 98.3°F | Ht 66.0 in | Wt 213.0 lb

## 2017-03-16 DIAGNOSIS — E119 Type 2 diabetes mellitus without complications: Secondary | ICD-10-CM | POA: Diagnosis not present

## 2017-03-16 DIAGNOSIS — Z6834 Body mass index (BMI) 34.0-34.9, adult: Secondary | ICD-10-CM

## 2017-03-16 DIAGNOSIS — E669 Obesity, unspecified: Secondary | ICD-10-CM

## 2017-03-16 MED ORDER — GLUCOSE BLOOD VI STRP: ORAL_STRIP | 0 refills | Status: DC

## 2017-03-16 MED ORDER — ACCU-CHEK MULTICLIX LANCETS MISC: 0 refills | Status: DC

## 2017-03-16 MED ORDER — METFORMIN HCL 500 MG PO TABS: 500.0000 mg | ORAL_TABLET | Freq: Two times a day (BID) | ORAL | 0 refills | Status: DC

## 2017-03-16 NOTE — Progress Notes (Signed)
Office: [phone]  /  Fax: [phone]   HPI:   Chief Complaint: OBESITY Erin Roach is here to discuss her progress with her obesity treatment plan. She is on the Category 2 plan and is following her eating plan approximately 90 % of the time. She states she is walking for 15 minutes 3 times per week. Erin Roach has done well with weight loss but she is retaining fluid. She is getting ready to go on vacation and she is worried about weight gain.  Her weight is 213 lb (96.6 kg) today and has not lost weight since her last visit. She has lost 10 lbs since starting treatment with [name].  Diabetes II Erin Roach has a diagnosis of diabetes type II. Erin Roach states fasting BGs range between 154 and 185 and not yet controlled even with diet. She denies any hypoglycemic episodes. Last A1c was 8.2 on 02/23/17. She has been working on intensive lifestyle modifications including diet, exercise, and weight loss to help control her blood glucose levels.  ALLERGIES: No Known Allergies  MEDICATIONS: Current Outpatient Medications on File Prior to Visit  Medication Sig Dispense Refill  . amitriptyline (ELAVIL) 10 MG tablet TAKE ONE TABLET BY MOUTH AT BEDTIME 30 tablet 0  . blood glucose meter kit and supplies KIT Dispense based on patient and insurance preference. Use up to four times daily as directed. (FOR E11.9). 1 each 0  . Cholecalciferol (VITAMIN D) 2000 units tablet Take 1 tablet (2,000 Units total) by mouth daily.    . fluticasone (FLONASE) 50 MCG/ACT nasal spray Place 1 spray into both nostrils daily. 16 g 0  . furosemide (LASIX) 20 MG tablet TAKE ONE TABLET BY MOUTH DAILY (Patient taking differently: TAKE TWO TABLET BY MOUTH DAILY-- takes in am) 90 tablet 3  . HYDROcodone-acetaminophen (NORCO/VICODIN) 5-325 MG tablet Take 1 tablet by mouth at bedtime. 30 tablet 0  . losartan (COZAAR) 100 MG tablet TAKE ONE TABLET BY MOUTH DAILY 90 tablet 1  . pramipexole (MIRAPEX) 0.25 MG tablet Take 0.5 mg by mouth 2 (two)  times daily. 1630  And  Bedtime    . pregabalin (LYRICA) 75 MG capsule TAKE 1 CAPSULE BY MOUTH IN AFTERNOON, AND 2 CAPSULES AT BEDTIME 270 capsule 1  . simvastatin (ZOCOR) 40 MG tablet TAKE ONE TABLET BY MOUTH AT BEDTIME 90 tablet 1  . vitamin B-12 (CYANOCOBALAMIN) 1000 MCG tablet Take 0.5 tablets (500 mcg total) by mouth daily. 30 tablet 1  . [DISCONTINUED] VESICARE 10 MG tablet TAKE 1 TABLET EVERY DAY 30 tablet 0   No current facility-administered medications on file prior to visit.     PAST MEDICAL HISTORY: Past Medical History:  Diagnosis Date  . Allergic rhinitis   . Arthritis   . Asthma    very mild  . Back pain   . Bilateral lower extremity edema   . Carotid artery disease ([place])    a. Carotid duplex 03/2016 - duplex was stable, 10-21% RICA, 1-17% LICA  . Complication of anesthesia    post-op delirium  . Endometrial polyp   . Essential hypertension   . GERD (gastroesophageal reflux disease)   . Heart murmur    per pt  . History of adenomatous polyp of colon    04-24-2016  tubular adenoma  . History of squamous cell carcinoma excision    pilonidal area second excision 02-02-2002  . HTN (hypertension)   . Hyperlipidemia   . Leg pain   . OAB (overactive bladder)   . RA (rheumatoid  arthritis) ([place])   . RLS (restless legs syndrome)   . Shortness of breath on exertion   . SUI (stress urinary incontinence, female)   . Venous insufficiency    legs  . Vitamin D deficiency     PAST SURGICAL HISTORY: Past Surgical History:  Procedure Laterality Date  . APPENDECTOMY  07-02-2005   [name]   open  . CARDIAC CATHETERIZATION  01-23-2009  dr Darnell Level brodie   normal coronary arteries and lvf  . CARDIOVASCULAR STRESS TEST  03/31/2016   Low risk nuclear study w/ medium defect of mild severity in basal anterior and mid anterior location w/ no evidence ischemia or infarction/  normal LV function and wall motion,  nuclear stress ef 71%  . CATARACT EXTRACTION W/ INTRAOCULAR LENS  IMPLANT Left 09/2016  . COLONOSCOPY  last one 04-24-2016  . POSTERIOR LUMBAR FUSION  02/ 2018    [facility] ([place], [place])  . SKIN CANCER EXCISION    . TONSILLECTOMY  child  . TRANSTHORACIC ECHOCARDIOGRAM  01/21/2009   mild LVH, ef 10-93%, grade 1 diastolic dysfunction  . ULNAR NERVE TRANSPOSITION Left 12-20-2008  dr sypher   decompression and partial resection medial triceps fascia  . WIDE LOCAL EXCISION PILONIDAL AREA  02-02-2002  [name]   recurrent squamous cell carcinoma in situ    SOCIAL HISTORY: Social History   Tobacco Use  . Smoking status: Former Smoker    Packs/day: 0.50    Years: 10.00    Pack years: 5.00    Types: Cigarettes    Last attempt to quit: 12/10/1973    Years since quitting: 43.2  . Smokeless tobacco: Never Used  Substance Use Topics  . Alcohol use: Yes    Comment: occasional  . Drug use: No    FAMILY HISTORY: Family History  Problem Relation Age of Onset  . Esophageal cancer Father   . Heart disease Father        Unknown what kind  . Hyperlipidemia Father   . Hypertension Father   . Cancer Father   . Heart failure Father   . Hyperlipidemia Mother   . Hypertension Mother   . CAD Sister        5 years younger than patient - had stent. Was told that her anorexia/bulimia may have played a role  . Stroke Maternal Grandmother 66  . Diabetes Maternal Grandmother   . Heart attack Maternal Grandfather 93  . COPD Maternal Grandfather   . Heart attack Paternal Grandmother        68s  . Diabetes Unknown        MGGM    ROS: Review of Systems  Constitutional: Negative for weight loss.  Endo/Heme/Allergies:       Negative hypoglycemia    PHYSICAL EXAM: Blood pressure 129/74, pulse 90, temperature 98.3 F (36.8 C), temperature source Oral, height _0  (1.676 m), weight 213 lb (96.6 kg), SpO2 96 %. Body mass index is 34.38 kg/m. Physical Exam  Constitutional: She is oriented to person, place, and time. She appears well-developed  and well-nourished.  Cardiovascular: Normal rate.  Pulmonary/Chest: Effort normal.  Musculoskeletal: Normal range of motion.  Neurological: She is oriented to person, place, and time.  Skin: Skin is warm and dry.  Psychiatric: She has a normal mood and affect. Her behavior is normal.  Vitals reviewed.   RECENT LABS AND TESTS: BMET    Component Value Date/Time   NA 138 02/23/2017 1007   K 4.2 02/23/2017 1007  CL 101 02/23/2017 1007   CO2 22 02/23/2017 1007   GLUCOSE 187 (H) 02/23/2017 1007   GLUCOSE 147 (H) 12/08/2016 1436   BUN 14 02/23/2017 1007   CREATININE 0.74 02/23/2017 1007   CALCIUM 8.6 (L) 02/23/2017 1007   GFRNONAA 80 02/23/2017 1007   GFRAA 92 02/23/2017 1007   Lab Results  Component Value Date   HGBA1C 8.2 (H) 02/23/2017   HGBA1C 5.6 12/08/2016   HGBA1C 5.3 06/04/2016   HGBA1C 5.2 01/08/2015   HGBA1C 5.5 08/22/2010   Lab Results  Component Value Date   INSULIN 30.7 (H) 02/23/2017   CBC    Component Value Date/Time   WBC 4.9 02/23/2017 1007   WBC 6.8 12/15/2016 1159   RBC 3.86 02/23/2017 1007   RBC 4.23 12/15/2016 1159   HGB 11.6 02/23/2017 1007   HCT 33.9 (L) 02/23/2017 1007   PLT 242 12/15/2016 1159   MCV 88 02/23/2017 1007   MCH 30.1 02/23/2017 1007   MCH 30.3 12/15/2016 1159   MCHC 34.2 02/23/2017 1007   MCHC 34.8 12/15/2016 1159   RDW 15.0 02/23/2017 1007   LYMPHSABS 1.4 02/23/2017 1007   MONOABS 0.5 06/04/2016 0808   EOSABS 0.2 02/23/2017 1007   BASOSABS 0.0 02/23/2017 1007   Iron/TIBC/Ferritin/ %Sat    Component Value Date/Time   IRON 109 03/02/2017 1035   TIBC 333 03/19/2015 1448   FERRITIN 218.0 03/02/2017 1035   IRONPCTSAT 15 03/19/2015 1448   Lipid Panel     Component Value Date/Time   CHOL 188 02/23/2017 1007   TRIG 212 (H) 02/23/2017 1007   TRIG 101 05/19/2006 0832   HDL 48 02/23/2017 1007   CHOLHDL 4 06/04/2016 0808   VLDL 31.4 06/04/2016 0808   LDLCALC 98 02/23/2017 1007   LDLDIRECT 141.3 09/06/2012 0907    Hepatic Function Panel     Component Value Date/Time   PROT 6.2 02/23/2017 1007   ALBUMIN 3.9 02/23/2017 1007   AST 41 (H) 02/23/2017 1007   ALT 58 (H) 02/23/2017 1007   ALKPHOS 84 02/23/2017 1007   BILITOT 0.5 02/23/2017 1007   BILIDIR 0.1 12/04/2013 1724      Component Value Date/Time   TSH 4.810 (H) 02/23/2017 1007   TSH 4.22 06/04/2016 0808   TSH 3.23 10/18/2014 1056    ASSESSMENT AND PLAN: Type 2 diabetes mellitus without complication, without long-term current use of insulin (HCC)  Class 1 obesity with serious comorbidity and body mass index (BMI) of 34.0 to 34.9 in adult, unspecified obesity type  PLAN:  Diabetes II Erin Roach has been given extensive diabetes education by myself today including ideal fasting and post-prandial blood glucose readings, individual ideal Hgb A1c goals and hypoglycemia prevention. We discussed the importance of good blood sugar control to decrease the likelihood of diabetic complications such as nephropathy, neuropathy, limb loss, blindness, coronary artery disease, and death. We discussed the importance of intensive lifestyle modification including diet, exercise and weight loss as the first line treatment for diabetes. Erin Roach agrees to increase metformin to 500 mg BID #60 with no refills, with test strips/lancets test BID. Erin Roach agrees to follow up with our clinic in 2 to 3 weeks.  Obesity Erin Roach is currently in the action stage of change. As such, her goal is to continue with weight loss efforts She has agreed to follow the Category 3 plan Erin Roach has been instructed to work up to a goal of 150 minutes of combined cardio and strengthening exercise per week for weight loss and  overall health benefits. We discussed the following Behavioral Modification Strategies today: increasing lean protein intake, work on meal planning and easy cooking plans, holiday eating strategies, and travel eating strategies   Erin Roach has agreed to follow up with our clinic  in 2 to 3 weeks. She was informed of the importance of frequent follow up visits to maximize her success with intensive lifestyle modifications for her multiple health conditions.  I, Erin Roach, am acting as transcriptionist for Erin Nip, MD  I have reviewed the above documentation for accuracy and completeness, and I agree with the above. -Erin Nip, MD      Today's visit was # 3 out of 22.  Starting weight: 223 lbs Starting date: 02/23/17 Today's weight : 213 lbs  Today's date: 03/16/2017 Total lbs lost to date: 10 (Patients must lose 7 lbs in the first 6 months to continue with counseling)   ASK: We discussed the diagnosis of obesity with Erin Roach today and Erin Roach agreed to give [name] permission to discuss obesity behavioral modification therapy today.  ASSESS: Erin Roach has the diagnosis of obesity and her BMI today is 34.4 Erin Roach is in the action stage of change   ADVISE: Erin Roach was educated on the multiple health risks of obesity as well as the benefit of weight loss to improve her health. She was advised of the need for long term treatment and the importance of lifestyle modifications.  AGREE: Multiple dietary modification options and treatment options were discussed and  Erin Roach agreed to follow the Category 3 plan We discussed the following Behavioral Modification Strategies today: increasing lean protein intake, work on meal planning and easy cooking plans, holiday eating strategies, and travel eating strategies.

## 2017-03-18 ENCOUNTER — Other Ambulatory Visit: Payer: Self-pay | Admitting: Internal Medicine

## 2017-03-18 ENCOUNTER — Ambulatory Visit (INDEPENDENT_AMBULATORY_CARE_PROVIDER_SITE_OTHER): Payer: Medicare Other | Admitting: Family Medicine

## 2017-03-18 NOTE — Telephone Encounter (Signed)
Check [place] registry last filled 02/16/2017.Marland KitchenJohny Chess

## 2017-03-18 NOTE — Telephone Encounter (Signed)
Pt called requesting a refill HYDROcodone-acetaminophen (NORCO/VICODIN) 5-325 MG tablet. She would like a call when it is ready.

## 2017-03-19 ENCOUNTER — Ambulatory Visit: Payer: Medicare Other | Admitting: Internal Medicine

## 2017-03-19 NOTE — Telephone Encounter (Signed)
Patient is calling again about this. She was informed we have a 24 to 72 hours policy. She states she unaware of this. She is going to be out of medication tomorrow. She wants to know what the issues is getting her medication. She is asking that it be filled by 5pm today. Please advise.

## 2017-03-22 ENCOUNTER — Other Ambulatory Visit: Payer: Self-pay | Admitting: Internal Medicine

## 2017-03-22 MED ORDER — HYDROCODONE-ACETAMINOPHEN 5-325 MG PO TABS: 1.0000 | ORAL_TABLET | Freq: Every day | ORAL | 0 refills | Status: DC

## 2017-03-22 NOTE — Telephone Encounter (Signed)
Patient is calling again about this. She has been informed, Dr.Burns is out of office today and will be back tomorrow. She is requesting to speak with the nurse.

## 2017-03-22 NOTE — Telephone Encounter (Signed)
Spoke with pt to inform that I could have a provider fill a short supply until Dr Quay Burow returned, she stated that it would be fine to wait until tomorrow to pick up the RX.

## 2017-03-23 NOTE — Telephone Encounter (Signed)
Spoke with pt, RX is placed upfront for pick-up

## 2017-03-25 ENCOUNTER — Other Ambulatory Visit: Payer: Self-pay | Admitting: Internal Medicine

## 2017-03-25 DIAGNOSIS — E119 Type 2 diabetes mellitus without complications: Secondary | ICD-10-CM

## 2017-03-29 ENCOUNTER — Other Ambulatory Visit: Payer: Self-pay | Admitting: Physical Medicine & Rehabilitation

## 2017-03-29 ENCOUNTER — Other Ambulatory Visit: Payer: Self-pay | Admitting: Internal Medicine

## 2017-03-30 ENCOUNTER — Other Ambulatory Visit: Payer: Self-pay | Admitting: Emergency Medicine

## 2017-03-30 MED ORDER — ONETOUCH ULTRA 2 W/DEVICE KIT: PACK | 0 refills | Status: DC

## 2017-04-02 ENCOUNTER — Other Ambulatory Visit: Payer: Self-pay | Admitting: Internal Medicine

## 2017-04-02 ENCOUNTER — Other Ambulatory Visit: Payer: Self-pay | Admitting: Physical Medicine & Rehabilitation

## 2017-04-05 ENCOUNTER — Other Ambulatory Visit (INDEPENDENT_AMBULATORY_CARE_PROVIDER_SITE_OTHER): Payer: Self-pay | Admitting: Family Medicine

## 2017-04-05 DIAGNOSIS — E119 Type 2 diabetes mellitus without complications: Secondary | ICD-10-CM

## 2017-04-06 ENCOUNTER — Other Ambulatory Visit: Payer: Self-pay | Admitting: Emergency Medicine

## 2017-04-07 ENCOUNTER — Ambulatory Visit (INDEPENDENT_AMBULATORY_CARE_PROVIDER_SITE_OTHER): Payer: Medicare Other | Admitting: Family Medicine

## 2017-04-07 VITALS — BP 116/57 | HR 89 | Temp 98.2°F | Ht 66.0 in | Wt 209.0 lb

## 2017-04-07 DIAGNOSIS — E119 Type 2 diabetes mellitus without complications: Secondary | ICD-10-CM | POA: Diagnosis not present

## 2017-04-07 DIAGNOSIS — E669 Obesity, unspecified: Secondary | ICD-10-CM | POA: Diagnosis not present

## 2017-04-07 DIAGNOSIS — Z6833 Body mass index (BMI) 33.0-33.9, adult: Secondary | ICD-10-CM | POA: Diagnosis not present

## 2017-04-07 DIAGNOSIS — G4709 Other insomnia: Secondary | ICD-10-CM

## 2017-04-07 MED ORDER — ZOLPIDEM TARTRATE 5 MG PO TABS: 5.0000 mg | ORAL_TABLET | Freq: Every evening | ORAL | 0 refills | Status: DC | PRN

## 2017-04-07 MED ORDER — INSULIN PEN NEEDLE 32G X 4 MM MISC: 1.0000 | Freq: Every day | 0 refills | Status: DC

## 2017-04-07 MED ORDER — LIRAGLUTIDE 18 MG/3ML ~~LOC~~ SOPN: 0.6000 mg | PEN_INJECTOR | SUBCUTANEOUS | 0 refills | Status: DC

## 2017-04-07 NOTE — Progress Notes (Signed)
Office: [phone]  /  Fax: [phone]   HPI:   Chief Complaint: OBESITY Erin Roach is here to discuss her progress with her obesity treatment plan. She is on the Category 3 plan and is following her eating plan approximately 45 % of the time. She states she is riding stationary bike and pilates for 15 minutes 4 times per week. Erin Roach has done well with weight loss even while traveling and over Thanksgiving, but following her diet plan as closely as possible. Her hunger is controlled.  Her weight is 209 lb (94.8 kg) today and has had a weight loss of 4 pounds over a period of 3 weeks since her last visit. She has lost 14 lbs since starting treatment with [name].  Diabetes II Erin Roach has a diagnosis of diabetes type II. Erin Roach's fasting BGs range between 113-180 and post prandial range between 91-220, on metformin but not yet controlled. She denies any hypoglycemic episodes. Last A1c was 8.2 on 02/23/17. She has been working on intensive lifestyle modifications including diet, exercise, and weight loss to help control her blood glucose levels.  Insomnia Erin Roach has tried many things in the past (melatonin, benadryl, and amitriptyline). She states she sleeps approximately 5 hours, falls asleep easily but wakes up frequently.  ALLERGIES: No Known Allergies  MEDICATIONS: Current Outpatient Medications on File Prior to Visit  Medication Sig Dispense Refill  . Blood Glucose Monitoring Suppl (ONE TOUCH ULTRA 2) w/Device KIT USE TO TEST BLOOD SUGARS UP TO 4 TIMES DAILY 1 each 0  . Cholecalciferol (VITAMIN D) 2000 units tablet Take 1 tablet (2,000 Units total) by mouth daily.    . fluticasone (FLONASE) 50 MCG/ACT nasal spray Place 1 spray into both nostrils daily. 16 g 0  . furosemide (LASIX) 20 MG tablet TAKE ONE TABLET BY MOUTH DAILY (Patient taking differently: TAKE TWO TABLET BY MOUTH DAILY-- takes in am) 90 tablet 3  . glucose blood (ACCU-CHEK AVIVA) test strip Use as instructed 100 each 0  .  HYDROcodone-acetaminophen (NORCO/VICODIN) 5-325 MG tablet Take 1 tablet at bedtime by mouth. 30 tablet 0  . losartan (COZAAR) 100 MG tablet TAKE ONE TABLET BY MOUTH DAILY 90 tablet 1  . metFORMIN (GLUCOPHAGE) 500 MG tablet Take 1 tablet (500 mg total) 2 (two) times daily with a meal by mouth. 60 tablet 0  . mirabegron ER (MYRBETRIQ) 50 MG TB24 tablet Take 1 tablet (50 mg total) by mouth daily. 90 tablet 1  . ONETOUCH DELICA LANCETS 19Q MISC USE AS DIRECTED UP TO 4 TIMES A DAY 100 each 1  . pramipexole (MIRAPEX) 0.25 MG tablet Take 0.5 mg by mouth 2 (two) times daily. 1630  And  Bedtime    . pregabalin (LYRICA) 75 MG capsule TAKE 1 CAPSULE BY MOUTH IN AFTERNOON, AND 2 CAPSULES AT BEDTIME 270 capsule 1  . simvastatin (ZOCOR) 40 MG tablet TAKE ONE TABLET BY MOUTH AT BEDTIME 90 tablet 1  . [DISCONTINUED] VESICARE 10 MG tablet TAKE 1 TABLET EVERY DAY 30 tablet 0   No current facility-administered medications on file prior to visit.     PAST MEDICAL HISTORY: Past Medical History:  Diagnosis Date  . Allergic rhinitis   . Arthritis   . Asthma    very mild  . Back pain   . Bilateral lower extremity edema   . Carotid artery disease ([place])    a. Carotid duplex 03/2016 - duplex was stable, 22-29% RICA, 7-98% LICA  . Complication of anesthesia    post-op delirium  .  Endometrial polyp   . Essential hypertension   . GERD (gastroesophageal reflux disease)   . Heart murmur    per pt  . History of adenomatous polyp of colon    04-24-2016  tubular adenoma  . History of squamous cell carcinoma excision    pilonidal area second excision 02-02-2002  . HTN (hypertension)   . Hyperlipidemia   . Leg pain   . OAB (overactive bladder)   . RA (rheumatoid arthritis) ([place])   . RLS (restless legs syndrome)   . Shortness of breath on exertion   . SUI (stress urinary incontinence, female)   . Venous insufficiency    legs  . Vitamin D deficiency     PAST SURGICAL HISTORY: Past Surgical History:    Procedure Laterality Date  . APPENDECTOMY  07-02-2005   [name]   open  . CARDIAC CATHETERIZATION  01-23-2009  dr Darnell Level brodie   normal coronary arteries and lvf  . CARDIOVASCULAR STRESS TEST  03/31/2016   Low risk nuclear study w/ medium defect of mild severity in basal anterior and mid anterior location w/ no evidence ischemia or infarction/  normal LV function and wall motion,  nuclear stress ef 71%  . CATARACT EXTRACTION W/ INTRAOCULAR LENS IMPLANT Left 09/2016  . COLONOSCOPY  last one 04-24-2016  . HYSTEROSCOPY W/D&C N/A 12/22/2016   Procedure: DILATATION AND CURETTAGE /HYSTEROSCOPY;  Surgeon: Dian Queen, MD;  Location: [facility];  Service: Gynecology;  Laterality: N/A;  . POSTERIOR LUMBAR FUSION  02/ 2018    [facility] ([place])  . SKIN CANCER EXCISION    . TONSILLECTOMY  child  . 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 TRANSTHORACIC ECHOCARDIOGRAM  01/21/2009   mild LVH, ef 81-85%, grade 1 diastolic dysfunction  . ULNAR NERVE TRANSPOSITION Left 12-20-2008  dr sypher   decompression and partial resection medial triceps fascia  . WIDE LOCAL EXCISION PILONIDAL AREA  02-02-2002  [name]   recurrent squamous cell carcinoma in situ    SOCIAL HISTORY: Social History   Tobacco Use  . Smoking status: Former Smoker    Packs/day: 0.50    Years: 10.00    Pack years: 5.00    Types: Cigarettes    Last attempt to quit: 12/10/1973    Years since quitting: 43.3  . Smokeless tobacco: Never Used  Substance Use Topics  . Alcohol use: Yes    Comment: occasional  . Drug use: No    FAMILY HISTORY: Family History  Problem Relation Age of Onset  . Esophageal cancer Father   . Heart disease Father        Unknown what kind  . Hyperlipidemia Father   . Hypertension Father   . Cancer Father   . Heart failure  Father   . Hyperlipidemia Mother   . Hypertension Mother   . CAD Sister        23 years younger than patient - had stent. Was told that her anorexia/bulimia may have played a role  . Stroke Maternal Grandmother 66  . Diabetes Maternal Grandmother   . Heart attack Maternal Grandfather 93  . COPD Maternal Grandfather   . Heart attack Paternal Grandmother        61s  . Diabetes Unknown        MGGM    ROS: Review of Systems  Constitutional: Positive for  weight loss.  Psychiatric/Behavioral: The patient has insomnia.     PHYSICAL EXAM: Blood pressure (!) 116/57, pulse 89, temperature 98.2 F (36.8 C), temperature source Oral, height '5\' 6"'  (1.676 m), weight 209 lb (94.8 kg), SpO2 99 %. Body mass index is 33.73 kg/m. Physical Exam  Constitutional: She is oriented to person, place, and time. She appears well-developed and well-nourished.  Cardiovascular: Normal rate.  Pulmonary/Chest: Effort normal.  Musculoskeletal: Normal range of motion.  Neurological: She is oriented to person, place, and time.  Skin: Skin is warm and dry.  Psychiatric: She has a normal mood and affect. Her behavior is normal.  Vitals reviewed.   RECENT LABS AND TESTS: BMET    Component Value Date/Time   NA 138 02/23/2017 1007   K 4.2 02/23/2017 1007   CL 101 02/23/2017 1007   CO2 22 02/23/2017 1007   GLUCOSE 187 (H) 02/23/2017 1007   GLUCOSE 147 (H) 12/08/2016 1436   BUN 14 02/23/2017 1007   CREATININE 0.74 02/23/2017 1007   CALCIUM 8.6 (L) 02/23/2017 1007   GFRNONAA 80 02/23/2017 1007   GFRAA 92 02/23/2017 1007   Lab Results  Component Value Date   HGBA1C 8.2 (H) 02/23/2017   HGBA1C 5.6 12/08/2016   HGBA1C 5.3 06/04/2016   HGBA1C 5.2 01/08/2015   HGBA1C 5.5 08/22/2010   Lab Results  Component Value Date   INSULIN 30.7 (H) 02/23/2017   CBC    Component Value Date/Time   WBC 4.9 02/23/2017 1007   WBC 6.8 12/15/2016 1159   RBC 3.86 02/23/2017 1007   RBC 4.23 12/15/2016 1159   HGB  11.6 02/23/2017 1007   HCT 33.9 (L) 02/23/2017 1007   PLT 242 12/15/2016 1159   MCV 88 02/23/2017 1007   MCH 30.1 02/23/2017 1007   MCH 30.3 12/15/2016 1159   MCHC 34.2 02/23/2017 1007   MCHC 34.8 12/15/2016 1159   RDW 15.0 02/23/2017 1007   LYMPHSABS 1.4 02/23/2017 1007   MONOABS 0.5 06/04/2016 0808   EOSABS 0.2 02/23/2017 1007   BASOSABS 0.0 02/23/2017 1007   Iron/TIBC/Ferritin/ %Sat    Component Value Date/Time   IRON 109 03/02/2017 1035   TIBC 333 03/19/2015 1448   FERRITIN 218.0 03/02/2017 1035   IRONPCTSAT 15 03/19/2015 1448   Lipid Panel     Component Value Date/Time   CHOL 188 02/23/2017 1007   TRIG 212 (H) 02/23/2017 1007   TRIG 101 05/19/2006 0832   HDL 48 02/23/2017 1007   CHOLHDL 4 06/04/2016 0808   VLDL 31.4 06/04/2016 0808   LDLCALC 98 02/23/2017 1007   LDLDIRECT 141.3 09/06/2012 0907   Hepatic Function Panel     Component Value Date/Time   PROT 6.2 02/23/2017 1007   ALBUMIN 3.9 02/23/2017 1007   AST 41 (H) 02/23/2017 1007   ALT 58 (H) 02/23/2017 1007   ALKPHOS 84 02/23/2017 1007   BILITOT 0.5 02/23/2017 1007   BILIDIR 0.1 12/04/2013 1724      Component Value Date/Time   TSH 4.810 (H) 02/23/2017 1007   TSH 4.22 06/04/2016 0808   TSH 3.23 10/18/2014 1056    ASSESSMENT AND PLAN: Type 2 diabetes mellitus without complication, without long-term current use of insulin (HCC)  Other insomnia  Class 1 obesity with serious comorbidity and body mass index (BMI) of 33.0 to 33.9 in adult, unspecified obesity type  PLAN:  Diabetes II Erin Roach has been given extensive diabetes education by myself today including ideal fasting and post-prandial blood glucose readings, individual ideal Hgb  A1c goals and hypoglycemia prevention. We discussed the importance of good blood sugar control to decrease the likelihood of diabetic complications such as nephropathy, neuropathy, limb loss, blindness, coronary artery disease, and death. We discussed the importance of  intensive lifestyle modification including diet, exercise and weight loss as the first line treatment for diabetes. Marshayla agrees to start Victoza 0.6 mg q AM 1 pen and nano needles #100 with no refills. Suhana agrees to follow up with our clinic in 2 weeks.  Insomnia Erin Roach agrees to start Ambien 5 mg half po qhs #10 with no refills and discontinue amitriptyline. She is worried of potential side effects itching, daytime somnolence, and confusion, and she agrees to take 2 times per week maximum. Erin Roach agrees to follow up with our clinic in 2 weeks.  Obesity Erin Roach is currently in the action stage of change. As such, her goal is to continue with weight loss efforts She has agreed to follow the Category 3 plan Erin Roach has been instructed to work up to a goal of 150 minutes of combined cardio and strengthening exercise per week for weight loss and overall health benefits. We discussed the following Behavioral Modification Strategies today: decreasing simple carbohydrates, travel eating strategies, and holiday eating strategies    Erin Roach has agreed to follow up with our clinic in 2 weeks. She was informed of the importance of frequent follow up visits to maximize her success with intensive lifestyle modifications for her multiple health conditions.  I, Trixie Dredge, am acting as transcriptionist for Dennard Nip, MD  I have reviewed the above documentation for accuracy and completeness, and I agree with the above. -Dennard Nip, MD     Today's visit was # 4 out of 22.  Starting weight: 223 lbs Starting date: 02/23/17 Today's weight : 209 lbs  Today's date: 04/07/2017 Total lbs lost to date: 14 (Patients must lose 7 lbs in the first 6 months to continue with counseling)   ASK: We discussed the diagnosis of obesity with Erin Roach today and Erin Roach agreed to give [name] permission to discuss obesity behavioral modification therapy today.  ASSESS: Erin Roach has the diagnosis of obesity and her  BMI today is 33.75 Erin Roach is in the action stage of change   ADVISE: Erin Roach was educated on the multiple health risks of obesity as well as the benefit of weight loss to improve her health. She was advised of the need for long term treatment and the importance of lifestyle modifications.  AGREE: Multiple dietary modification options and treatment options were discussed and  Erin Roach agreed to follow the Category 3 plan We discussed the following Behavioral Modification Strategies today: decreasing simple carbohydrates, travel eating strategies, and holiday eating strategies

## 2017-04-08 ENCOUNTER — Encounter (INDEPENDENT_AMBULATORY_CARE_PROVIDER_SITE_OTHER): Payer: Self-pay | Admitting: Family Medicine

## 2017-04-12 ENCOUNTER — Ambulatory Visit (INDEPENDENT_AMBULATORY_CARE_PROVIDER_SITE_OTHER): Payer: Medicare Other | Admitting: Orthopaedic Surgery

## 2017-04-12 ENCOUNTER — Encounter (INDEPENDENT_AMBULATORY_CARE_PROVIDER_SITE_OTHER): Payer: Self-pay | Admitting: Orthopaedic Surgery

## 2017-04-12 ENCOUNTER — Ambulatory Visit (INDEPENDENT_AMBULATORY_CARE_PROVIDER_SITE_OTHER): Payer: Self-pay

## 2017-04-12 ENCOUNTER — Other Ambulatory Visit (INDEPENDENT_AMBULATORY_CARE_PROVIDER_SITE_OTHER): Payer: Self-pay

## 2017-04-12 VITALS — BP 115/68 | HR 99 | Resp 14 | Ht 64.0 in | Wt 200.0 lb

## 2017-04-12 DIAGNOSIS — Z96652 Presence of left artificial knee joint: Secondary | ICD-10-CM

## 2017-04-12 NOTE — Progress Notes (Signed)
Office Visit Note   Patient: Erin Roach           Date of Birth: 03-12-1941           MRN: [id number] Visit Date: 04/12/2017              Requested by: Binnie Rail, MD [place], [place] [address] PCP: Binnie Rail, MD   Assessment & Plan: Visit Diagnoses:  1. Presence of left artificial knee joint     Plan: Painful left total knee replacement a long discussion regarding diagnostic possibilities. We'll obtain sedimentation rate and C-reactive protein and 3-phase bone scan. Office visit over 30 minutes  Follow-Up Instructions: No Follow-up on file.   Orders:  Orders Placed This Encounter  Procedures  . XR KNEE 3 VIEW LEFT   No orders of the defined types were placed in this encounter.     Procedures: No procedures performed   Clinical Data: No additional findings.   Subjective: Chief Complaint  Patient presents with  . Left Knee - Pain    Erin Roach is a 76 y o Left knee replacement in 2016. Pt states she has all over knee pain x 2 years. She has trouble walking, sleeping, b/c of pain. Elevate only. Denies injury, DX sleep apnea   Uncomplicated left total knee replacement 2 years ago performed elsewhere. Has had trouble "ever since". No injury or trauma. No fever, chills. Or shortness of breath or chest pain. Recently diagnosed with diabetes. Double areas of tenderness about the knee but no obvious effusion. As had a history of back pain but has had a successful fusion without any referred pain to the buttock and groin left hip or thigh. No loss of sensation distally.  Review of Systems  Constitutional: Positive for fatigue. Negative for chills and fever.  Eyes: Negative for itching.  Respiratory: Positive for apnea and shortness of breath. Negative for chest tightness.   Cardiovascular: Negative for chest pain, palpitations and leg swelling.  Gastrointestinal: Negative for blood in stool, constipation and diarrhea.  Endocrine: Negative for  polyuria.  Genitourinary: Negative for dysuria.  Musculoskeletal: Positive for neck stiffness. Negative for back pain, joint swelling and neck pain.  Allergic/Immunologic: Negative for immunocompromised state.  Neurological: Negative for dizziness and numbness.  Hematological: Does not bruise/bleed easily.  Psychiatric/Behavioral: Positive for sleep disturbance. The patient is not nervous/anxious.      Objective: Vital Signs: BP 115/68   Pulse 99   Resp 14   Ht 5\' 4"  (1.626 m)   Wt 200 lb (90.7 kg)   BMI 34.33 kg/m   Physical Exam  Ortho Exam awake alert and oriented 3 comfortable sitting. Left knee was not hot warm or red. No instability with a varus valgus stress. Minimal anterior drawer sign. Multiple areas of localized tenderness. No obvious effusion. Full extension and flexion over 100. No calf pain. Mild swelling distally. Good pulses  Specialty Comments:  No specialty comments available.  Imaging: No results found.   PMFS History: Patient Active Problem List   Diagnosis Date Noted  . Diabetes ([place]) 03/02/2017  . Herpes zoster without complication 81/02/3158  . Other fatigue 02/23/2017  . Shortness of breath on exertion 02/23/2017  . B12 nutritional deficiency 02/23/2017  . Vitamin D deficiency 12/08/2016  . Tubular adenoma of colon 04/29/2016  . Essential hypertension 09/12/2015  . Allergic rhinitis 07/17/2015  . Insomnia 07/17/2015  . Obese 04/10/2015  . S/P left TKA 04/09/2015  . Osteopenia  12/04/2012  . 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 RESTLESS LEG SYNDROME 02/11/2009  . LEG EDEMA, BILATERAL 02/11/2009  . Hyperlipidemia 10/30/2008  . Urinary incontinence 10/30/2008  . Skin cancer 10/30/2008  . Asthma 11/21/2007  . Venous (peripheral) insufficiency 11/22/2006  . SNORING 11/22/2006   Past Medical History:  Diagnosis Date  . Allergic rhinitis   . Apnea   . Arthritis   . Asthma    very mild  . Back pain   . Bilateral lower extremity edema   .  Carotid artery disease ([place])    a. Carotid duplex 03/2016 - duplex was stable, 31-54% RICA, 0-08% LICA  . Complication of anesthesia    post-op delirium  . Endometrial polyp   . Essential hypertension   . GERD (gastroesophageal reflux disease)   . Heart murmur    per pt  . History of adenomatous polyp of colon    04-24-2016  tubular adenoma  . History of squamous cell carcinoma excision    pilonidal area second excision 02-02-2002  . HTN (hypertension)   . Hyperlipidemia   . Leg pain   . OAB (overactive bladder)   . RA (rheumatoid arthritis) ([place])   . RLS (restless legs syndrome)   . Shortness of breath on exertion   . SUI (stress urinary incontinence, female)   . Venous insufficiency    legs  . Vitamin D deficiency     Family History  Problem Relation Age of Onset  . Esophageal cancer Father   . Heart disease Father        Unknown what kind  . Hyperlipidemia Father   . Hypertension Father   . Cancer Father   . Heart failure Father   . Hyperlipidemia Mother   . Hypertension Mother   . CAD Sister        39 years younger than patient - had stent. Was told that her anorexia/bulimia may have played a role  . Stroke Maternal Grandmother 66  . Diabetes Maternal Grandmother   . Heart attack Maternal Grandfather 93  . COPD Maternal Grandfather   . Heart attack Paternal Grandmother        60s  . Diabetes Unknown        MGGM    Past Surgical History:  Procedure Laterality Date  . APPENDECTOMY  07-02-2005   [name]   open  . CARDIAC CATHETERIZATION  01-23-2009  dr Darnell Level brodie   normal coronary arteries and lvf  . CARDIOVASCULAR STRESS TEST  03/31/2016   Low risk nuclear study w/ medium defect of mild severity in basal anterior and mid anterior location w/ no evidence ischemia or infarction/  normal LV function and wall motion,  nuclear stress ef 71%  . CATARACT EXTRACTION W/ INTRAOCULAR LENS IMPLANT Left 09/2016  . COLONOSCOPY  last one 04-24-2016  . HYSTEROSCOPY  W/D&C N/A 12/22/2016   Procedure: DILATATION AND CURETTAGE /HYSTEROSCOPY;  Surgeon: Dian Queen, MD;  Location: [facility];  Service: Gynecology;  Laterality: N/A;  . POSTERIOR LUMBAR FUSION  02/ 2018    [facility] ([place], [place])  . SKIN CANCER EXCISION    . TONSILLECTOMY  child  . 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 TRANSTHORACIC ECHOCARDIOGRAM  01/21/2009   mild LVH, ef 67-61%, grade 1 diastolic dysfunction  . ULNAR NERVE TRANSPOSITION Left 12-20-2008  dr sypher   decompression and  partial resection medial triceps fascia  . WIDE LOCAL EXCISION PILONIDAL AREA  02-02-2002  [name]   recurrent squamous cell carcinoma in situ   Social History   Occupational History  . Occupation: retired Pharmacist, hospital  Tobacco Use  . Smoking status: Former Smoker    Packs/day: 0.50    Years: 10.00    Pack years: 5.00    Types: Cigarettes    Last attempt to quit: 12/10/1973    Years since quitting: 43.3  . Smokeless tobacco: Never Used  Substance and Sexual Activity  . Alcohol use: Yes    Comment: occasional  . Drug use: No  . Sexual activity: Not on file

## 2017-04-13 LAB — SEDIMENTATION RATE: SED RATE: 38 mm/h — AB (ref 0–30)

## 2017-04-13 LAB — C-REACTIVE PROTEIN: CRP: 6.1 mg/L (ref <8.0)

## 2017-04-14 ENCOUNTER — Telehealth: Payer: Self-pay | Admitting: Internal Medicine

## 2017-04-14 MED ORDER — HYDROCODONE-ACETAMINOPHEN 5-325 MG PO TABS: 1.0000 | ORAL_TABLET | Freq: Every day | ORAL | 0 refills | Status: DC

## 2017-04-14 NOTE — Telephone Encounter (Signed)
Check [place] registry last filled 03/23/2017 @ [place]...Johny Chess

## 2017-04-14 NOTE — Telephone Encounter (Signed)
rx sent to pharmacy

## 2017-04-14 NOTE — Telephone Encounter (Signed)
Called pt no answer LMOM rx has been sent to [facility]...Erin Roach

## 2017-04-14 NOTE — Telephone Encounter (Signed)
Copied from [place] [id number]. Topic: Quick Communication - See Telephone Encounter >> Apr 14, 2017  8:07 AM Oneta Rack wrote: CRM for notification. See Telephone encounter for:   04/14/17.  Relation to pt: self Call back number:[phone]   Reason for call:  Patient requesting HYDROcodone-acetaminophen (NORCO/VICODIN) 5-325 MG tablet, patient states "Lovena Le" advised Rx can be mailed, patient would like a returned call, please advise

## 2017-04-15 ENCOUNTER — Telehealth (INDEPENDENT_AMBULATORY_CARE_PROVIDER_SITE_OTHER): Payer: Self-pay | Admitting: Family Medicine

## 2017-04-15 ENCOUNTER — Encounter (INDEPENDENT_AMBULATORY_CARE_PROVIDER_SITE_OTHER): Payer: Self-pay

## 2017-04-15 NOTE — Telephone Encounter (Signed)
ambien low dose generic , was told to take half the pill not working , can she take whole pill

## 2017-04-15 NOTE — Telephone Encounter (Signed)
Sent the patient a my chart message. Manjot Hinks, CMA

## 2017-04-19 ENCOUNTER — Other Ambulatory Visit (INDEPENDENT_AMBULATORY_CARE_PROVIDER_SITE_OTHER): Payer: Self-pay | Admitting: Family Medicine

## 2017-04-19 ENCOUNTER — Ambulatory Visit (INDEPENDENT_AMBULATORY_CARE_PROVIDER_SITE_OTHER): Payer: Medicare Other | Admitting: Family Medicine

## 2017-04-21 ENCOUNTER — Ambulatory Visit (INDEPENDENT_AMBULATORY_CARE_PROVIDER_SITE_OTHER): Payer: Medicare Other | Admitting: Family Medicine

## 2017-04-21 ENCOUNTER — Encounter (INDEPENDENT_AMBULATORY_CARE_PROVIDER_SITE_OTHER): Payer: Self-pay | Admitting: Orthopaedic Surgery

## 2017-04-21 VITALS — BP 131/77 | HR 86 | Temp 98.6°F | Ht 64.0 in | Wt 203.0 lb

## 2017-04-21 DIAGNOSIS — Z6835 Body mass index (BMI) 35.0-35.9, adult: Secondary | ICD-10-CM | POA: Diagnosis not present

## 2017-04-21 DIAGNOSIS — G4709 Other insomnia: Secondary | ICD-10-CM

## 2017-04-21 DIAGNOSIS — E119 Type 2 diabetes mellitus without complications: Secondary | ICD-10-CM

## 2017-04-21 MED ORDER — ONETOUCH DELICA LANCETS 33G MISC: 1 refills | Status: DC

## 2017-04-21 MED ORDER — METFORMIN HCL 500 MG PO TABS: 500.0000 mg | ORAL_TABLET | Freq: Two times a day (BID) | ORAL | 0 refills | Status: DC

## 2017-04-21 MED ORDER — GLUCOSE BLOOD VI STRP: ORAL_STRIP | 0 refills | Status: DC

## 2017-04-21 MED ORDER — ZOLPIDEM TARTRATE 5 MG PO TABS: 5.0000 mg | ORAL_TABLET | Freq: Every evening | ORAL | 0 refills | Status: DC | PRN

## 2017-04-21 MED ORDER — LIRAGLUTIDE 18 MG/3ML ~~LOC~~ SOPN: 0.6000 mg | PEN_INJECTOR | SUBCUTANEOUS | 0 refills | Status: DC

## 2017-04-21 MED ORDER — INSULIN PEN NEEDLE 32G X 4 MM MISC: 1.0000 | Freq: Every day | 0 refills | Status: DC

## 2017-04-23 ENCOUNTER — Encounter (INDEPENDENT_AMBULATORY_CARE_PROVIDER_SITE_OTHER): Payer: Self-pay | Admitting: Family Medicine

## 2017-04-23 ENCOUNTER — Encounter: Payer: Self-pay | Admitting: Internal Medicine

## 2017-04-26 NOTE — Progress Notes (Signed)
Office: [phone]  /  Fax: [phone]   HPI:   Chief Complaint: OBESITY Erin Roach is here to discuss her progress with her obesity treatment plan. She is on the Category 2 plan with breakfast options and is following her eating plan approximately 80-85 % of the time. She states she is bike riding and walking for 20 minutes 5-6 times per week. Erin Roach continues to do very well with weight loss. She states she is not skipping meals and doing well planning meals ahead of time. She likes her additional breakfast options and hunger is well controlled.  Her weight is 203 lb (92.1 kg) today and has had a weight loss of 6 pounds over a period of 2 weeks since her last visit. She has lost 20 lbs since starting treatment with [name].  Diabetes II Erin Roach has a diagnosis of diabetes type II. Last A1c was 8.2, she started on Victoza at 0.6. Erin Roach states fasting BGs range in 120's and 2 hour post prandial range in 160's (no log book today). She denies any hypoglycemic episodes.  She has been working on intensive lifestyle modifications including diet, exercise, and weight loss to help control her blood glucose levels.  Insomnia Erin Roach has insomnia and she was given 1/2 5 mg Ambien.   ALLERGIES: No Known Allergies  MEDICATIONS: Current Outpatient Medications on File Prior to Visit  Medication Sig Dispense Refill  . Blood Glucose Monitoring Suppl (ONE TOUCH ULTRA 2) w/Device KIT USE TO TEST BLOOD SUGARS UP TO 4 TIMES DAILY 1 each 0  . Cholecalciferol (VITAMIN D) 2000 units tablet Take 1 tablet (2,000 Units total) by mouth daily.    . fluticasone (FLONASE) 50 MCG/ACT nasal spray Place 1 spray into both nostrils daily. 16 g 0  . furosemide (LASIX) 20 MG tablet TAKE ONE TABLET BY MOUTH DAILY (Patient taking differently: TAKE TWO TABLET BY MOUTH DAILY-- takes in am) 90 tablet 3  . HYDROcodone-acetaminophen (NORCO/VICODIN) 5-325 MG tablet Take 1 tablet by mouth at bedtime. 30 tablet 0  . losartan (COZAAR) 100 MG  tablet TAKE ONE TABLET BY MOUTH DAILY 90 tablet 1  . mirabegron ER (MYRBETRIQ) 50 MG TB24 tablet Take 1 tablet (50 mg total) by mouth daily. 90 tablet 1  . pramipexole (MIRAPEX) 0.25 MG tablet Take 0.5 mg by mouth 2 (two) times daily. 1630  And  Bedtime    . pregabalin (LYRICA) 75 MG capsule TAKE 1 CAPSULE BY MOUTH IN AFTERNOON, AND 2 CAPSULES AT BEDTIME 270 capsule 1  . simvastatin (ZOCOR) 40 MG tablet TAKE ONE TABLET BY MOUTH AT BEDTIME 90 tablet 1  . [DISCONTINUED] VESICARE 10 MG tablet TAKE 1 TABLET EVERY DAY 30 tablet 0   No current facility-administered medications on file prior to visit.     PAST MEDICAL HISTORY: Past Medical History:  Diagnosis Date  . Allergic rhinitis   . Apnea   . Arthritis   . Asthma    very mild  . Back pain   . Bilateral lower extremity edema   . Carotid artery disease ([place])    a. Carotid duplex 03/2016 - duplex was stable, 93-81% RICA, 8-29% LICA  . Complication of anesthesia    post-op delirium  . Endometrial polyp   . Essential hypertension   . GERD (gastroesophageal reflux disease)   . Heart murmur    per pt  . History of adenomatous polyp of colon    04-24-2016  tubular adenoma  . History of squamous cell carcinoma excision  pilonidal area second excision 02-02-2002  . HTN (hypertension)   . Hyperlipidemia   . Leg pain   . OAB (overactive bladder)   . RA (rheumatoid arthritis) ([place])   . RLS (restless legs syndrome)   . Shortness of breath on exertion   . SUI (stress urinary incontinence, female)   . Venous insufficiency    legs  . Vitamin D deficiency     PAST SURGICAL HISTORY: Past Surgical History:  Procedure Laterality Date  . APPENDECTOMY  07-02-2005   [name]   open  . CARDIAC CATHETERIZATION  01-23-2009  dr Darnell Level brodie   normal coronary arteries and lvf  . CARDIOVASCULAR STRESS TEST  03/31/2016   Low risk nuclear study w/ medium defect of mild severity in basal anterior and mid anterior location w/ no evidence  ischemia or infarction/  normal LV function and wall motion,  nuclear stress ef 71%  . CATARACT EXTRACTION W/ INTRAOCULAR LENS IMPLANT Left 09/2016  . COLONOSCOPY  last one 04-24-2016  . HYSTEROSCOPY W/D&C N/A 12/22/2016   Procedure: DILATATION AND CURETTAGE /HYSTEROSCOPY;  Surgeon: Dian Queen, MD;  Location: [facility];  Service: Gynecology;  Laterality: N/A;  . POSTERIOR LUMBAR FUSION  02/ 2018    [facility] ([place], [place])  . SKIN CANCER EXCISION    . TONSILLECTOMY  child  . 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 TRANSTHORACIC ECHOCARDIOGRAM  01/21/2009   mild LVH, ef 02-33%, grade 1 diastolic dysfunction  . ULNAR NERVE TRANSPOSITION Left 12-20-2008  dr sypher   decompression and partial resection medial triceps fascia  . WIDE LOCAL EXCISION PILONIDAL AREA  02-02-2002  [name]   recurrent squamous cell carcinoma in situ    SOCIAL HISTORY: Social History   Tobacco Use  . Smoking status: Former Smoker    Packs/day: 0.50    Years: 10.00    Pack years: 5.00    Types: Cigarettes    Last attempt to quit: 12/10/1973    Years since quitting: 43.4  . Smokeless tobacco: Never Used  Substance Use Topics  . Alcohol use: Yes    Comment: occasional  . Drug use: No    FAMILY HISTORY: Family History  Problem Relation Age of Onset  . Esophageal cancer Father   . Heart disease Father        Unknown what kind  . Hyperlipidemia Father   . Hypertension Father   . Cancer Father   . Heart failure Father   . Hyperlipidemia Mother   . Hypertension Mother   . CAD Sister        3 years younger than patient - had stent. Was told that her anorexia/bulimia may have played a role  . Stroke Maternal Grandmother 66  . Diabetes Maternal Grandmother   . Heart attack Maternal Grandfather 93  . COPD Maternal Grandfather   . Heart  attack Paternal Grandmother        45s  . Diabetes Unknown        MGGM    ROS: Review of Systems  Constitutional: Positive for weight loss.  Endo/Heme/Allergies:       Negative hypoglycemia  Psychiatric/Behavioral: The patient has insomnia.     PHYSICAL EXAM: Blood pressure 131/77, pulse 86, temperature 98.6 F (37 C), temperature source Oral, height _0  (1.626 m), weight 203 lb (92.1 kg), SpO2 98 %. Body  mass index is 34.84 kg/m. Physical Exam  Constitutional: She is oriented to person, place, and time. She appears well-developed and well-nourished.  Cardiovascular: Normal rate.  Pulmonary/Chest: Effort normal.  Musculoskeletal: Normal range of motion.  Neurological: She is oriented to person, place, and time.  Skin: Skin is warm and dry.  Psychiatric: She has a normal mood and affect. Her behavior is normal.  Vitals reviewed.   RECENT LABS AND TESTS: BMET    Component Value Date/Time   NA 138 02/23/2017 1007   K 4.2 02/23/2017 1007   CL 101 02/23/2017 1007   CO2 22 02/23/2017 1007   GLUCOSE 187 (H) 02/23/2017 1007   GLUCOSE 147 (H) 12/08/2016 1436   BUN 14 02/23/2017 1007   CREATININE 0.74 02/23/2017 1007   CALCIUM 8.6 (L) 02/23/2017 1007   GFRNONAA 80 02/23/2017 1007   GFRAA 92 02/23/2017 1007   Lab Results  Component Value Date   HGBA1C 8.2 (H) 02/23/2017   HGBA1C 5.6 12/08/2016   HGBA1C 5.3 06/04/2016   HGBA1C 5.2 01/08/2015   HGBA1C 5.5 08/22/2010   Lab Results  Component Value Date   INSULIN 30.7 (H) 02/23/2017   CBC    Component Value Date/Time   WBC 4.9 02/23/2017 1007   WBC 6.8 12/15/2016 1159   RBC 3.86 02/23/2017 1007   RBC 4.23 12/15/2016 1159   HGB 11.6 02/23/2017 1007   HCT 33.9 (L) 02/23/2017 1007   PLT 242 12/15/2016 1159   MCV 88 02/23/2017 1007   MCH 30.1 02/23/2017 1007   MCH 30.3 12/15/2016 1159   MCHC 34.2 02/23/2017 1007   MCHC 34.8 12/15/2016 1159   RDW 15.0 02/23/2017 1007   LYMPHSABS 1.4 02/23/2017 1007   MONOABS  0.5 06/04/2016 0808   EOSABS 0.2 02/23/2017 1007   BASOSABS 0.0 02/23/2017 1007   Iron/TIBC/Ferritin/ %Sat    Component Value Date/Time   IRON 109 03/02/2017 1035   TIBC 333 03/19/2015 1448   FERRITIN 218.0 03/02/2017 1035   IRONPCTSAT 15 03/19/2015 1448   Lipid Panel     Component Value Date/Time   CHOL 188 02/23/2017 1007   TRIG 212 (H) 02/23/2017 1007   TRIG 101 05/19/2006 0832   HDL 48 02/23/2017 1007   CHOLHDL 4 06/04/2016 0808   VLDL 31.4 06/04/2016 0808   LDLCALC 98 02/23/2017 1007   LDLDIRECT 141.3 09/06/2012 0907   Hepatic Function Panel     Component Value Date/Time   PROT 6.2 02/23/2017 1007   ALBUMIN 3.9 02/23/2017 1007   AST 41 (H) 02/23/2017 1007   ALT 58 (H) 02/23/2017 1007   ALKPHOS 84 02/23/2017 1007   BILITOT 0.5 02/23/2017 1007   BILIDIR 0.1 12/04/2013 1724      Component Value Date/Time   TSH 4.810 (H) 02/23/2017 1007   TSH 4.22 06/04/2016 0808   TSH 3.23 10/18/2014 1056    ASSESSMENT AND PLAN: Type 2 diabetes mellitus without complication, without long-term current use of insulin ([place]) - Plan: metFORMIN (GLUCOPHAGE) 500 MG tablet, liraglutide (VICTOZA) 18 MG/3ML SOPN, Insulin Pen Needle (BD PEN NEEDLE NANO U/F) 32G X 4 MM MISC, ONETOUCH DELICA LANCETS 51W MISC, glucose blood (ACCU-CHEK AVIVA) test strip  Other insomnia - Plan: zolpidem (AMBIEN) 5 MG tablet  Class 2 severe obesity with serious comorbidity and body mass index (BMI) of 35.0 to 35.9 in adult, unspecified obesity type ([place])  PLAN:  Diabetes II Charlisha has been given extensive diabetes education by myself today including ideal fasting and post-prandial blood glucose readings,  individual ideal Hgb A1c goals and hypoglycemia prevention. We discussed the importance of good blood sugar control to decrease the likelihood of diabetic complications such as nephropathy, neuropathy, limb loss, blindness, coronary artery disease, and death. We discussed the importance of intensive lifestyle  modification including diet, exercise and weight loss as the first line treatment for diabetes. Tira agrees to continue taking metformin 500 mg BID #60 and we will refill for 1 month. She agrees to continue Victoza 0.6 mg qd #1 pen, with nano needles #100, lancets #100, and glucose strips #100 and we will refill for 1 month. Amyrie agrees to follow up with our clinic in 3 weeks.  Insomnia Lesli agrees to continue taking Ambien 5 mg qhs as needed #30 with no refills and no print. Christin agrees to follow up with our clinic in 3 weeks.   Obesity Capri is currently in the action stage of change. As such, her goal is to continue with weight loss efforts She has agreed to follow the Category 2 plan with breakfast options Gabriellia has been instructed to work up to a goal of 150 minutes of combined cardio and strengthening exercise per week for weight loss and overall health benefits. We discussed the following Behavioral Modification Strategies today: work on meal planning and easy cooking plans, holiday eating strategies, and celebration eating strategies    Jamilex has agreed to follow up with our clinic in 3 weeks. She was informed of the importance of frequent follow up visits to maximize her success with intensive lifestyle modifications for her multiple health conditions.  I, Trixie Dredge, am acting as transcriptionist for Dennard Nip, MD  I have reviewed the above documentation for accuracy and completeness, and I agree with the above. -Dennard Nip, MD     Today's visit was # 5 out of 22.  Starting weight: 223 lbs Starting date: 02/23/17 Today's weight : 203 lbs  Today's date: 04/21/2017 Total lbs lost to date: 20 (Patients must lose 7 lbs in the first 6 months to continue with counseling)   ASK: We discussed the diagnosis of obesity with [name] today and [name] agreed to give [name] permission to discuss obesity behavioral modification therapy today.  ASSESS: Ketsia has the  diagnosis of obesity and her BMI today is 34.83 Sarin is in the action stage of change   ADVISE: Joelly was educated on the multiple health risks of obesity as well as the benefit of weight loss to improve her health. She was advised of the need for long term treatment and the importance of lifestyle modifications.  AGREE: Multiple dietary modification options and treatment options were discussed and  Jennessa agreed to follow the Category 2 plan with breakfast options We discussed the following Behavioral Modification Strategies today: work on meal planning and easy cooking plans, holiday eating strategies, and celebration eating strategies

## 2017-05-06 ENCOUNTER — Other Ambulatory Visit: Payer: Self-pay | Admitting: Physical Medicine & Rehabilitation

## 2017-05-09 ENCOUNTER — Encounter: Payer: Self-pay | Admitting: Internal Medicine

## 2017-05-10 ENCOUNTER — Other Ambulatory Visit: Payer: Self-pay | Admitting: Physical Medicine & Rehabilitation

## 2017-05-10 ENCOUNTER — Encounter (INDEPENDENT_AMBULATORY_CARE_PROVIDER_SITE_OTHER): Payer: Self-pay | Admitting: Family Medicine

## 2017-05-12 MED ORDER — HYDROCODONE-ACETAMINOPHEN 5-325 MG PO TABS: 1.0000 | ORAL_TABLET | Freq: Every day | ORAL | 0 refills | Status: DC

## 2017-05-12 NOTE — Telephone Encounter (Signed)
[place] Controlled Substance Database checked. Last filled on 04/20/17

## 2017-05-12 NOTE — Telephone Encounter (Signed)
Does she want me to send to [employer]?

## 2017-05-13 ENCOUNTER — Ambulatory Visit (INDEPENDENT_AMBULATORY_CARE_PROVIDER_SITE_OTHER): Payer: Medicare Other | Admitting: Family Medicine

## 2017-05-13 VITALS — BP 120/61 | HR 103 | Temp 98.0°F | Ht 64.0 in | Wt 204.0 lb

## 2017-05-13 DIAGNOSIS — Z6835 Body mass index (BMI) 35.0-35.9, adult: Secondary | ICD-10-CM

## 2017-05-13 DIAGNOSIS — E119 Type 2 diabetes mellitus without complications: Secondary | ICD-10-CM | POA: Diagnosis not present

## 2017-05-13 NOTE — Progress Notes (Signed)
Office: [phone]  /  Fax: [phone]   HPI:   Chief Complaint: OBESITY Erin Roach is here to discuss her progress with her obesity treatment plan. She is on the Category 2 plan and is following her eating plan approximately 50 % of the time. She states she is walking and biking for 15 to 30 minutes 3 times per week. Erin Roach has done well with portion control and making smarter choices over the holidays and she minimized celebration eating. Erin Roach is ready to get back on track, but would like more dinner options. Her weight is 204 lb (92.5 kg) today and has had a weight gain of 1 pound over a period of 3 weeks since her last visit. She has lost 19 lbs since starting treatment with Erin Roach.  Diabetes II Erin Roach has a diagnosis of diabetes type II. Erin Roach states fasting BGs range between 130 and 140's, 2 hour post prandial range between 135 and 209 (mostly 150 to 160's) on victoza and metformin and she denies GI upset or any hypoglycemic episodes. She has been working on intensive lifestyle modifications including diet, exercise, and weight loss to help control her blood glucose levels.  ALLERGIES: No Known Allergies  MEDICATIONS: Current Outpatient Medications on File Prior to Visit  Medication Sig Dispense Refill  . Blood Glucose Monitoring Suppl (ONE TOUCH ULTRA 2) w/Device KIT USE TO TEST BLOOD SUGARS UP TO 4 TIMES DAILY 1 each 0  . Cholecalciferol (VITAMIN D) 2000 units tablet Take 1 tablet (2,000 Units total) by mouth daily.    . fluticasone (FLONASE) 50 MCG/ACT nasal spray Place 1 spray into both nostrils daily. 16 g 0  . furosemide (LASIX) 20 MG tablet TAKE ONE TABLET BY MOUTH DAILY (Patient taking differently: TAKE TWO TABLET BY MOUTH DAILY-- takes in am) 90 tablet 3  . glucose blood (ACCU-CHEK AVIVA) test strip Use as instructed 100 each 0  . HYDROcodone-acetaminophen (NORCO/VICODIN) 5-325 MG tablet Take 1 tablet by mouth at bedtime. 30 tablet 0  . Insulin Pen Needle (BD PEN NEEDLE NANO  U/F) 32G X 4 MM MISC 1 Package by Does not apply route daily at 12 noon. 100 each 0  . liraglutide (VICTOZA) 18 MG/3ML SOPN Inject 0.1 mLs (0.6 mg total) into the skin every morning. 1 pen 0  . losartan (COZAAR) 100 MG tablet TAKE ONE TABLET BY MOUTH DAILY 90 tablet 1  . metFORMIN (GLUCOPHAGE) 500 MG tablet Take 1 tablet (500 mg total) by mouth 2 (two) times daily with a meal. 60 tablet 0  . mirabegron ER (MYRBETRIQ) 50 MG TB24 tablet Take 1 tablet (50 mg total) by mouth daily. 90 tablet 1  . ONETOUCH DELICA LANCETS 97X MISC USE AS DIRECTED UP TO 4 TIMES A DAY 100 each 1  . pramipexole (MIRAPEX) 0.25 MG tablet Take 0.5 mg by mouth 2 (two) times daily. 1630  And  Bedtime    . pregabalin (LYRICA) 75 MG capsule TAKE 1 CAPSULE BY MOUTH IN AFTERNOON, AND 2 CAPSULES AT BEDTIME 270 capsule 1  . simvastatin (ZOCOR) 40 MG tablet TAKE ONE TABLET BY MOUTH AT BEDTIME 90 tablet 1  . zolpidem (AMBIEN) 5 MG tablet Take 1 tablet (5 mg total) by mouth at bedtime as needed for sleep. Half tab at bedtime 30 tablet 0  . [DISCONTINUED] VESICARE 10 MG tablet TAKE 1 TABLET EVERY DAY 30 tablet 0   No current facility-administered medications on file prior to visit.     PAST MEDICAL HISTORY: Past Medical History:  Diagnosis Date  . Allergic rhinitis   . Apnea   . Arthritis   . Asthma    very mild  . Back pain   . Bilateral lower extremity edema   . Carotid artery disease ([place])    a. Carotid duplex 03/2016 - duplex was stable, 89-38% RICA, 1-01% LICA  . Complication of anesthesia    post-op delirium  . Endometrial polyp   . Essential hypertension   . GERD (gastroesophageal reflux disease)   . Heart murmur    per pt  . History of adenomatous polyp of colon    04-24-2016  tubular adenoma  . History of squamous cell carcinoma excision    pilonidal area second excision 02-02-2002  . HTN (hypertension)   . Hyperlipidemia   . Leg pain   . OAB (overactive bladder)   . RA (rheumatoid arthritis) ([place])   .  RLS (restless legs syndrome)   . Shortness of breath on exertion   . SUI (stress urinary incontinence, female)   . Venous insufficiency    legs  . Vitamin D deficiency     PAST SURGICAL HISTORY: Past Surgical History:  Procedure Laterality Date  . APPENDECTOMY  07-02-2005   [name]   open  . CARDIAC CATHETERIZATION  01-23-2009  dr Darnell Level brodie   normal coronary arteries and lvf  . CARDIOVASCULAR STRESS TEST  03/31/2016   Low risk nuclear study w/ medium defect of mild severity in basal anterior and mid anterior location w/ no evidence ischemia or infarction/  normal LV function and wall motion,  nuclear stress ef 71%  . CATARACT EXTRACTION W/ INTRAOCULAR LENS IMPLANT Left 09/2016  . COLONOSCOPY  last one 04-24-2016  . HYSTEROSCOPY W/D&C N/A 12/22/2016   Procedure: DILATATION AND CURETTAGE /HYSTEROSCOPY;  Surgeon: Dian Queen, MD;  Location: [facility];  Service: Gynecology;  Laterality: N/A;  . POSTERIOR LUMBAR FUSION  02/ 2018    [facility] ([place])  . SKIN CANCER EXCISION    . TONSILLECTOMY  child  . 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 TRANSTHORACIC ECHOCARDIOGRAM  01/21/2009   mild LVH, ef 75-10%, grade 1 diastolic dysfunction  . ULNAR NERVE TRANSPOSITION Left 12-20-2008  dr sypher   decompression and partial resection medial triceps fascia  . WIDE LOCAL EXCISION PILONIDAL AREA  02-02-2002  [name]   recurrent squamous cell carcinoma in situ    SOCIAL HISTORY: Social History   Tobacco Use  . Smoking status: Former Smoker    Packs/day: 0.50    Years: 10.00    Pack years: 5.00    Types: Cigarettes    Last attempt to quit: 12/10/1973    Years since quitting: 43.4  . Smokeless tobacco: Never Used  Substance Use Topics  . Alcohol use: Yes    Comment: occasional  . Drug use: No    FAMILY  HISTORY: Family History  Problem Relation Age of Onset  . Esophageal cancer Father   . Heart disease Father        Unknown what kind  . Hyperlipidemia Father   . Hypertension Father   . Cancer Father   . Heart failure Father   . Hyperlipidemia Mother   . Hypertension Mother   . CAD Sister        39 years younger than patient - had stent. Was told that her anorexia/bulimia  may have played a role  . Stroke Maternal Grandmother 66  . Diabetes Maternal Grandmother   . Heart attack Maternal Grandfather 93  . COPD Maternal Grandfather   . Heart attack Paternal Grandmother        54s  . Diabetes Unknown        MGGM    ROS: Review of Systems  Constitutional: Negative for weight loss.  Gastrointestinal: Negative for diarrhea, nausea and vomiting.    PHYSICAL EXAM: Blood pressure 120/61, pulse (!) 103, temperature 98 F (36.7 C), temperature source Oral, height '5\' 4"'  (1.626 m), weight 204 lb (92.5 kg), SpO2 100 %. Body mass index is 35.02 kg/m. Physical Exam  Constitutional: She is oriented to person, place, and time. She appears well-developed and well-nourished.  Cardiovascular: Normal rate.  Pulmonary/Chest: Effort normal.  Musculoskeletal: Normal range of motion.  Neurological: She is oriented to person, place, and time.  Skin: Skin is warm and dry.  Psychiatric: She has a normal mood and affect. Her behavior is normal.  Vitals reviewed.   RECENT LABS AND TESTS: BMET    Component Value Date/Time   NA 138 02/23/2017 1007   K 4.2 02/23/2017 1007   CL 101 02/23/2017 1007   CO2 22 02/23/2017 1007   GLUCOSE 187 (H) 02/23/2017 1007   GLUCOSE 147 (H) 12/08/2016 1436   BUN 14 02/23/2017 1007   CREATININE 0.74 02/23/2017 1007   CALCIUM 8.6 (L) 02/23/2017 1007   GFRNONAA 80 02/23/2017 1007   GFRAA 92 02/23/2017 1007   Lab Results  Component Value Date   HGBA1C 8.2 (H) 02/23/2017   HGBA1C 5.6 12/08/2016   HGBA1C 5.3 06/04/2016   HGBA1C 5.2 01/08/2015   HGBA1C 5.5  08/22/2010   Lab Results  Component Value Date   INSULIN 30.7 (H) 02/23/2017   CBC    Component Value Date/Time   WBC 4.9 02/23/2017 1007   WBC 6.8 12/15/2016 1159   RBC 3.86 02/23/2017 1007   RBC 4.23 12/15/2016 1159   HGB 11.6 02/23/2017 1007   HCT 33.9 (L) 02/23/2017 1007   PLT 242 12/15/2016 1159   MCV 88 02/23/2017 1007   MCH 30.1 02/23/2017 1007   MCH 30.3 12/15/2016 1159   MCHC 34.2 02/23/2017 1007   MCHC 34.8 12/15/2016 1159   RDW 15.0 02/23/2017 1007   LYMPHSABS 1.4 02/23/2017 1007   MONOABS 0.5 06/04/2016 0808   EOSABS 0.2 02/23/2017 1007   BASOSABS 0.0 02/23/2017 1007   Iron/TIBC/Ferritin/ %Sat    Component Value Date/Time   IRON 109 03/02/2017 1035   TIBC 333 03/19/2015 1448   FERRITIN 218.0 03/02/2017 1035   IRONPCTSAT 15 03/19/2015 1448   Lipid Panel     Component Value Date/Time   CHOL 188 02/23/2017 1007   TRIG 212 (H) 02/23/2017 1007   TRIG 101 05/19/2006 0832   HDL 48 02/23/2017 1007   CHOLHDL 4 06/04/2016 0808   VLDL 31.4 06/04/2016 0808   LDLCALC 98 02/23/2017 1007   LDLDIRECT 141.3 09/06/2012 0907   Hepatic Function Panel     Component Value Date/Time   PROT 6.2 02/23/2017 1007   ALBUMIN 3.9 02/23/2017 1007   AST 41 (H) 02/23/2017 1007   ALT 58 (H) 02/23/2017 1007   ALKPHOS 84 02/23/2017 1007   BILITOT 0.5 02/23/2017 1007   BILIDIR 0.1 12/04/2013 1724      Component Value Date/Time   TSH 4.810 (H) 02/23/2017 1007   TSH 4.22 06/04/2016 0808   TSH 3.23 10/18/2014 1056  ASSESSMENT AND PLAN: Type 2 diabetes mellitus without complication, without long-term current use of insulin (H[facility])  Class 2 severe obesity with serious comorbidity and body mass index (BMI) of 35.0 to 35.9 in adult, unspecified obesity type ([place])  PLAN:  Diabetes II Erin Roach has been given extensive diabetes education by myself today including ideal fasting and post-prandial blood glucose readings, individual ideal Hgb A1c goals  and hypoglycemia prevention. We  discussed the importance of good blood sugar control to decrease the likelihood of diabetic complications such as nephropathy, neuropathy, limb loss, blindness, coronary artery disease, and death. We discussed the importance of intensive lifestyle modification including diet, exercise and weight loss as the first line treatment for diabetes. Erin Roach agrees to get back to diet prescription and continue to check blood sugar. We may increase dose at next visit. Erin Roach agrees to follow up with our clinic in 2 weeks.  We spent > than 50% of the 15 minute visit on the counseling as documented in the note.  Obesity Erin Roach is currently in the action stage of change. As such, her goal is to continue with weight loss efforts She has agreed to keep a food journal with 350 to 500 calories and 35 grams of protein at supper daily and follow the Category 2 plan Erin Roach has been instructed to work up to a goal of 150 minutes of combined cardio and strengthening exercise per week for weight loss and overall health benefits. We discussed the following Behavioral Modification Strategies today: increasing lean protein intake, decreasing simple carbohydrates  and work on meal planning and easy cooking plans  Erin Roach has agreed to follow up with our clinic in 2 weeks. She was informed of the importance of frequent follow up visits to maximize her success with intensive lifestyle modifications for her multiple health conditions.     OBESITY BEHAVIORAL INTERVENTION VISIT  Today's visit was # 6 out of 22.  Starting weight: 223 lbs Starting date: 02/23/17 Today's weight : 204 lbs  Today's date: 05/13/2017 Total lbs lost to date: 32 (Patients must lose 7 lbs in the first 6 months to continue with counseling)   ASK: We discussed the diagnosis of obesity with Erin Roach today and Erin Roach agreed to give Erin Roach permission to discuss obesity behavioral modification therapy today.  ASSESS: Erin Roach has the diagnosis of obesity and  her BMI today is 35.1 Erin Roach is in the action stage of change   ADVISE: Erin Roach was educated on the multiple health risks of obesity as well as the benefit of weight loss to improve her health. She was advised of the need for long term treatment and the importance of lifestyle modifications.  AGREE: Multiple dietary modification options and treatment options were discussed and  Erin Roach agreed to keep a food journal with 350 to 500 calories and 35 grams of protein at supper daily and follow the Category 2 plan We discussed the following Behavioral Modification Strategies today: increasing lean protein intake, decreasing simple carbohydrates  and work on meal planning and easy cooking plans  I, Erin Roach, am acting as transcriptionist for Dennard Nip, MD  I have reviewed the above documentation for accuracy and completeness, and I agree with the above. -Dennard Nip, MD

## 2017-05-14 ENCOUNTER — Ambulatory Visit (HOSPITAL_COMMUNITY)
Admission: RE | Admit: 2017-05-14 | Discharge: 2017-05-14 | Disposition: A | Discharge status: Home / Self Care | Payer: Medicare Other | Source: Ambulatory Visit | Attending: Orthopaedic Surgery | Admitting: Orthopaedic Surgery

## 2017-05-14 ENCOUNTER — Encounter (HOSPITAL_COMMUNITY)
Admission: RE | Admit: 2017-05-14 | Discharge: 2017-05-14 | Disposition: A | Discharge status: Home / Self Care | Payer: Medicare Other | Source: Ambulatory Visit | Attending: Orthopaedic Surgery | Admitting: Orthopaedic Surgery

## 2017-05-14 DIAGNOSIS — Z9889 Other specified postprocedural states: Secondary | ICD-10-CM | POA: Insufficient documentation

## 2017-05-14 DIAGNOSIS — Z96652 Presence of left artificial knee joint: Secondary | ICD-10-CM | POA: Insufficient documentation

## 2017-05-14 MED ORDER — TECHNETIUM TC 99M MEDRONATE IV KIT
25.0000 | PACK | Freq: Once | INTRAVENOUS | Status: AC | PRN
  Administered 2017-05-14: 25 via INTRAVENOUS

## 2017-05-16 ENCOUNTER — Encounter: Payer: Self-pay | Admitting: Internal Medicine

## 2017-05-19 ENCOUNTER — Other Ambulatory Visit: Payer: Self-pay | Admitting: Internal Medicine

## 2017-05-19 ENCOUNTER — Other Ambulatory Visit (INDEPENDENT_AMBULATORY_CARE_PROVIDER_SITE_OTHER): Payer: Self-pay | Admitting: Family Medicine

## 2017-05-19 DIAGNOSIS — E119 Type 2 diabetes mellitus without complications: Secondary | ICD-10-CM

## 2017-05-19 DIAGNOSIS — G4709 Other insomnia: Secondary | ICD-10-CM

## 2017-05-24 ENCOUNTER — Ambulatory Visit (INDEPENDENT_AMBULATORY_CARE_PROVIDER_SITE_OTHER): Payer: Medicare Other | Admitting: Family Medicine

## 2017-05-24 VITALS — BP 131/80 | HR 81 | Temp 98.0°F | Ht 64.0 in | Wt 200.0 lb

## 2017-05-24 DIAGNOSIS — Z6834 Body mass index (BMI) 34.0-34.9, adult: Secondary | ICD-10-CM | POA: Diagnosis not present

## 2017-05-24 DIAGNOSIS — E119 Type 2 diabetes mellitus without complications: Secondary | ICD-10-CM | POA: Diagnosis not present

## 2017-05-24 DIAGNOSIS — E669 Obesity, unspecified: Secondary | ICD-10-CM

## 2017-05-25 ENCOUNTER — Ambulatory Visit (INDEPENDENT_AMBULATORY_CARE_PROVIDER_SITE_OTHER): Payer: Medicare Other | Admitting: Family Medicine

## 2017-05-25 NOTE — Progress Notes (Signed)
Office: [phone]  /  Fax: [phone]   HPI:   Chief Complaint: OBESITY Erin Roach is here to discuss her progress with her obesity treatment plan. She is on the Category 2 plan and is following her eating plan approximately 90 % of the time. She states she is biking and walking for 30 minutes 3 times per week. Erin Roach did well with weight loss. She started to journal for dinner and is working on increasing her lean protein intake. Erin Roach is tired of her fruit options. Her weight is 200 lb (90.7 kg) today and has had a weight loss of 4 pounds over a period of 2 weeks since her last visit. She has lost 23 lbs since starting treatment with [name].  Diabetes II Erin Roach has a diagnosis of diabetes type II. Ray states fasting BGs range between 114 and 121, 2 hour post prandial range between 120 and 130's and is much better controlled with diet. Erin Roach denies any hypoglycemic episodes. She has been working on intensive lifestyle modifications including diet, exercise, and weight loss to help control her blood glucose levels.  ALLERGIES: No Known Allergies  MEDICATIONS: Current Outpatient Medications on File Prior to Visit  Medication Sig Dispense Refill  . Blood Glucose Monitoring Suppl (ONE TOUCH ULTRA 2) w/Device KIT USE TO TEST BLOOD SUGARS UP TO 4 TIMES DAILY 1 each 0  . Cholecalciferol (VITAMIN D) 2000 units tablet Take 1 tablet (2,000 Units total) by mouth daily.    . fluticasone (FLONASE) 50 MCG/ACT nasal spray Place 1 spray into both nostrils daily. 16 g 0  . furosemide (LASIX) 20 MG tablet TAKE ONE TABLET BY MOUTH DAILY (Patient taking differently: TAKE TWO TABLET BY MOUTH DAILY-- takes in am) 90 tablet 3  . glucose blood (ACCU-CHEK AVIVA) test strip Use as instructed 100 each 0  . HYDROcodone-acetaminophen (NORCO/VICODIN) 5-325 MG tablet Take 1 tablet by mouth at bedtime. 30 tablet 0  . Insulin Pen Needle (BD PEN NEEDLE NANO U/F) 32G X 4 MM MISC 1 Package by Does not apply route daily at 12  noon. 100 each 0  . liraglutide (VICTOZA) 18 MG/3ML SOPN Inject 0.1 mLs (0.6 mg total) into the skin every morning. 1 pen 0  . losartan (COZAAR) 100 MG tablet TAKE ONE TABLET BY MOUTH DAILY 90 tablet 1  . metFORMIN (GLUCOPHAGE) 500 MG tablet Take 1 tablet (500 mg total) by mouth 2 (two) times daily with a meal. 60 tablet 0  . mirabegron ER (MYRBETRIQ) 50 MG TB24 tablet Take 1 tablet (50 mg total) by mouth daily. 90 tablet 1  . ONETOUCH DELICA LANCETS 36U MISC USE AS DIRECTED UP TO 4 TIMES A DAY 100 each 1  . ONETOUCH DELICA LANCETS 44I MISC USE AS DIRECTED UP TO 4 TIMES A DAY 100 each 5  . pramipexole (MIRAPEX) 0.25 MG tablet Take 0.5 mg by mouth 2 (two) times daily. 1630  And  Bedtime    . pregabalin (LYRICA) 75 MG capsule TAKE 1 CAPSULE BY MOUTH IN AFTERNOON, AND 2 CAPSULES AT BEDTIME 270 capsule 1  . simvastatin (ZOCOR) 40 MG tablet TAKE ONE TABLET BY MOUTH AT BEDTIME 90 tablet 1  . zolpidem (AMBIEN) 5 MG tablet Take 1 tablet (5 mg total) by mouth at bedtime as needed for sleep. Half tab at bedtime 30 tablet 0  . [DISCONTINUED] VESICARE 10 MG tablet TAKE 1 TABLET EVERY DAY 30 tablet 0   No current facility-administered medications on file prior to visit.  PAST MEDICAL HISTORY: Past Medical History:  Diagnosis Date  . Allergic rhinitis   . Apnea   . Arthritis   . Asthma    very mild  . Back pain   . Bilateral lower extremity edema   . Carotid artery disease ([place])    a. Carotid duplex 03/2016 - duplex was stable, 19-41% RICA, 7-40% LICA  . Complication of anesthesia    post-op delirium  . Endometrial polyp   . Essential hypertension   . GERD (gastroesophageal reflux disease)   . Heart murmur    per pt  . History of adenomatous polyp of colon    04-24-2016  tubular adenoma  . History of squamous cell carcinoma excision    pilonidal area second excision 02-02-2002  . HTN (hypertension)   . Hyperlipidemia   . Leg pain   . OAB (overactive bladder)   . RA (rheumatoid  arthritis) ([place])   . RLS (restless legs syndrome)   . Shortness of breath on exertion   . SUI (stress urinary incontinence, female)   . Venous insufficiency    legs  . Vitamin D deficiency     PAST SURGICAL HISTORY: Past Surgical History:  Procedure Laterality Date  . APPENDECTOMY  07-02-2005   [name]   open  . CARDIAC CATHETERIZATION  01-23-2009  dr Darnell Level brodie   normal coronary arteries and lvf  . CARDIOVASCULAR STRESS TEST  03/31/2016   Low risk nuclear study w/ medium defect of mild severity in basal anterior and mid anterior location w/ no evidence ischemia or infarction/  normal LV function and wall motion,  nuclear stress ef 71%  . CATARACT EXTRACTION W/ INTRAOCULAR LENS IMPLANT Left 09/2016  . COLONOSCOPY  last one 04-24-2016  . HYSTEROSCOPY W/D&C N/A 12/22/2016   Procedure: DILATATION AND CURETTAGE /HYSTEROSCOPY;  Surgeon: Dian Queen, MD;  Location: [facility];  Service: Gynecology;  Laterality: N/A;  . POSTERIOR LUMBAR FUSION  02/ 2018    [facility] ([place])  . SKIN CANCER EXCISION    . TONSILLECTOMY  child  . 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 TRANSTHORACIC ECHOCARDIOGRAM  01/21/2009   mild LVH, ef 81-44%, grade 1 diastolic dysfunction  . ULNAR NERVE TRANSPOSITION Left 12-20-2008  dr sypher   decompression and partial resection medial triceps fascia  . WIDE LOCAL EXCISION PILONIDAL AREA  02-02-2002  [name]   recurrent squamous cell carcinoma in situ    SOCIAL HISTORY: Social History   Tobacco Use  . Smoking status: Former Smoker    Packs/day: 0.50    Years: 10.00    Pack years: 5.00    Types: Cigarettes    Last attempt to quit: 12/10/1973    Years since quitting: 43.4  . Smokeless tobacco: Never Used  Substance Use Topics  . Alcohol use: Yes    Comment: occasional  . Drug  use: No    FAMILY HISTORY: Family History  Problem Relation Age of Onset  . Esophageal cancer Father   . Heart disease Father        Unknown what kind  . Hyperlipidemia Father   . Hypertension Father   . Cancer Father   . Heart failure Father   . Hyperlipidemia Mother   . Hypertension Mother   . CAD Sister        52 years younger than patient -  had stent. Was told that her anorexia/bulimia may have played a role  . Stroke Maternal Grandmother 66  . Diabetes Maternal Grandmother   . Heart attack Maternal Grandfather 93  . COPD Maternal Grandfather   . Heart attack Paternal Grandmother        36s  . Diabetes Unknown        MGGM    ROS: Review of Systems  Constitutional: Positive for weight loss.  Endo/Heme/Allergies:       Negative hypoglycemia    PHYSICAL EXAM: Blood pressure 131/80, pulse 81, temperature 98 F (36.7 C), temperature source Oral, height '5\' 4"'$  (1.626 m), weight 200 lb (90.7 kg), SpO2 100 %. Body mass index is 34.33 kg/m. Physical Exam  Constitutional: She is oriented to person, place, and time. She appears well-developed and well-nourished.  Cardiovascular: Normal rate.  Pulmonary/Chest: Effort normal.  Musculoskeletal: Normal range of motion.  Neurological: She is oriented to person, place, and time.  Skin: Skin is warm and dry.  Psychiatric: She has a normal mood and affect. Her behavior is normal.  Vitals reviewed.   RECENT LABS AND TESTS: BMET    Component Value Date/Time   NA 138 02/23/2017 1007   K 4.2 02/23/2017 1007   CL 101 02/23/2017 1007   CO2 22 02/23/2017 1007   GLUCOSE 187 (H) 02/23/2017 1007   GLUCOSE 147 (H) 12/08/2016 1436   BUN 14 02/23/2017 1007   CREATININE 0.74 02/23/2017 1007   CALCIUM 8.6 (L) 02/23/2017 1007   GFRNONAA 80 02/23/2017 1007   GFRAA 92 02/23/2017 1007   Lab Results  Component Value Date   HGBA1C 8.2 (H) 02/23/2017   HGBA1C 5.6 12/08/2016   HGBA1C 5.3 06/04/2016   HGBA1C 5.2 01/08/2015   HGBA1C  5.5 08/22/2010   Lab Results  Component Value Date   INSULIN 30.7 (H) 02/23/2017   CBC    Component Value Date/Time   WBC 4.9 02/23/2017 1007   WBC 6.8 12/15/2016 1159   RBC 3.86 02/23/2017 1007   RBC 4.23 12/15/2016 1159   HGB 11.6 02/23/2017 1007   HCT 33.9 (L) 02/23/2017 1007   PLT 242 12/15/2016 1159   MCV 88 02/23/2017 1007   MCH 30.1 02/23/2017 1007   MCH 30.3 12/15/2016 1159   MCHC 34.2 02/23/2017 1007   MCHC 34.8 12/15/2016 1159   RDW 15.0 02/23/2017 1007   LYMPHSABS 1.4 02/23/2017 1007   MONOABS 0.5 06/04/2016 0808   EOSABS 0.2 02/23/2017 1007   BASOSABS 0.0 02/23/2017 1007   Iron/TIBC/Ferritin/ %Sat    Component Value Date/Time   IRON 109 03/02/2017 1035   TIBC 333 03/19/2015 1448   FERRITIN 218.0 03/02/2017 1035   IRONPCTSAT 15 03/19/2015 1448   Lipid Panel     Component Value Date/Time   CHOL 188 02/23/2017 1007   TRIG 212 (H) 02/23/2017 1007   TRIG 101 05/19/2006 0832   HDL 48 02/23/2017 1007   CHOLHDL 4 06/04/2016 0808   VLDL 31.4 06/04/2016 0808   LDLCALC 98 02/23/2017 1007   LDLDIRECT 141.3 09/06/2012 0907   Hepatic Function Panel     Component Value Date/Time   PROT 6.2 02/23/2017 1007   ALBUMIN 3.9 02/23/2017 1007   AST 41 (H) 02/23/2017 1007   ALT 58 (H) 02/23/2017 1007   ALKPHOS 84 02/23/2017 1007   BILITOT 0.5 02/23/2017 1007   BILIDIR 0.1 12/04/2013 1724      Component Value Date/Time   TSH 4.810 (H) 02/23/2017 1007   TSH 4.22 06/04/2016  0808   TSH 3.23 10/18/2014 1056    ASSESSMENT AND PLAN: Type 2 diabetes mellitus without complication, without long-term current use of insulin (HCC)  Class 1 obesity with serious comorbidity and body mass index (BMI) of 34.0 to 34.9 in adult, unspecified obesity type  PLAN:  Diabetes II Erin Roach has been given extensive diabetes education by myself today including ideal fasting and post-prandial blood glucose readings, individual ideal Hgb A1c goals and hypoglycemia prevention. We discussed  the importance of good blood sugar control to decrease the likelihood of diabetic complications such as nephropathy, neuropathy, limb loss, blindness, coronary artery disease, and death. We discussed the importance of intensive lifestyle modification including diet, exercise and weight loss as the first line treatment for diabetes. She agrees to continue to work on diet. Erin Roach agrees to continue her diabetes medications and will follow up at the agreed upon time.  We spent > than 50% of the 15 minute visit on the counseling as documented in the note.  Obesity Erin Roach is currently in the action stage of change. As such, her goal is to continue with weight loss efforts She has agreed to keep a food journal with 350 to 500 calories and 35 grams of protein at supper daily and follow the Category 2 plan Erin Roach has been instructed to work up to a goal of 150 minutes of combined cardio and strengthening exercise per week for weight loss and overall health benefits. We discussed the following Behavioral Modification Strategies today: low sugar fruit options given, increasing lean protein intake, holiday eating strategies and decrease liquid calories  Erin Roach has agreed to follow up with our clinic in 2 to 3 weeks. She was informed of the importance of frequent follow up visits to maximize her success with intensive lifestyle modifications for her multiple health conditions.   OBESITY BEHAVIORAL INTERVENTION VISIT  Today's visit was # 7 out of 22.  Starting weight: 223 Starting date: 02/23/17 Today's weight : 200 lbs  Today's date: 05/24/2017 Total lbs lost to date: 23 (Patients must lose 7 lbs in the first 6 months to continue with counseling)   ASK: We discussed the diagnosis of obesity with Erin Roach today and Erin Roach agreed to give [name] permission to discuss obesity behavioral modification therapy today.  ASSESS: Erin Roach has the diagnosis of obesity and her BMI today is 34.31 Erin Roach is in the  action stage of change   ADVISE: Erin Roach was educated on the multiple health risks of obesity as well as the benefit of weight loss to improve her health. She was advised of the need for long term treatment and the importance of lifestyle modifications.  AGREE: Multiple dietary modification options and treatment options were discussed and  Erin Roach agreed to the above obesity treatment plan.  I, Doreene Nest, am acting as transcriptionist for Dennard Nip, MD  I have reviewed the above documentation for accuracy and completeness, and I agree with the above. -Dennard Nip, MD

## 2017-05-26 ENCOUNTER — Ambulatory Visit (INDEPENDENT_AMBULATORY_CARE_PROVIDER_SITE_OTHER): Payer: Medicare Other | Admitting: Family Medicine

## 2017-06-01 ENCOUNTER — Other Ambulatory Visit (INDEPENDENT_AMBULATORY_CARE_PROVIDER_SITE_OTHER): Payer: Self-pay | Admitting: Family Medicine

## 2017-06-01 DIAGNOSIS — E119 Type 2 diabetes mellitus without complications: Secondary | ICD-10-CM

## 2017-06-03 ENCOUNTER — Encounter (INDEPENDENT_AMBULATORY_CARE_PROVIDER_SITE_OTHER): Payer: Self-pay | Admitting: Family Medicine

## 2017-06-03 ENCOUNTER — Other Ambulatory Visit (INDEPENDENT_AMBULATORY_CARE_PROVIDER_SITE_OTHER): Payer: Self-pay

## 2017-06-03 DIAGNOSIS — E119 Type 2 diabetes mellitus without complications: Secondary | ICD-10-CM

## 2017-06-03 MED ORDER — METFORMIN HCL 500 MG PO TABS: 500.0000 mg | ORAL_TABLET | Freq: Two times a day (BID) | ORAL | 0 refills | Status: DC

## 2017-06-03 NOTE — Telephone Encounter (Signed)
Pt request metformin

## 2017-06-03 NOTE — Telephone Encounter (Signed)
Request a refill on metformin

## 2017-06-08 NOTE — Telephone Encounter (Signed)
Prescription was sent in to pharmacy.

## 2017-06-09 ENCOUNTER — Encounter: Payer: Self-pay | Admitting: Internal Medicine

## 2017-06-09 ENCOUNTER — Ambulatory Visit (INDEPENDENT_AMBULATORY_CARE_PROVIDER_SITE_OTHER): Payer: Medicare Other | Admitting: Family Medicine

## 2017-06-09 VITALS — BP 129/77 | HR 79 | Temp 98.1°F | Ht 64.0 in | Wt 198.0 lb

## 2017-06-09 DIAGNOSIS — Z6834 Body mass index (BMI) 34.0-34.9, adult: Secondary | ICD-10-CM

## 2017-06-09 DIAGNOSIS — E119 Type 2 diabetes mellitus without complications: Secondary | ICD-10-CM

## 2017-06-09 DIAGNOSIS — E7849 Other hyperlipidemia: Secondary | ICD-10-CM

## 2017-06-09 DIAGNOSIS — E559 Vitamin D deficiency, unspecified: Secondary | ICD-10-CM | POA: Diagnosis not present

## 2017-06-09 DIAGNOSIS — R7989 Other specified abnormal findings of blood chemistry: Secondary | ICD-10-CM

## 2017-06-09 DIAGNOSIS — R945 Abnormal results of liver function studies: Secondary | ICD-10-CM

## 2017-06-09 DIAGNOSIS — E669 Obesity, unspecified: Secondary | ICD-10-CM

## 2017-06-09 MED ORDER — HYDROCODONE-ACETAMINOPHEN 5-325 MG PO TABS: 1.0000 | ORAL_TABLET | Freq: Every day | ORAL | 0 refills | Status: DC

## 2017-06-09 NOTE — Progress Notes (Signed)
Office: [phone]  /  Fax: [phone]   HPI:   Chief Complaint: OBESITY Erin Roach is here to discuss her progress with her obesity treatment plan. She is on the keep a food journal with 350-500 calories and 35 grams of protein at supper daily and follow the Category 2 plan and is following her eating plan approximately 25 % of the time. She states she is walking for 15 minutes 3 times per week. Elly continues to lose weight but hasn't been following her plan due to her mother's recent death, increase traveling, etc. She is ready to try to get back to a structured plan.  Her weight is 198 lb (89.8 kg) today and has had a weight loss of 2 pounds over a period of 2 weeks since her last visit. She has lost 25 lbs since starting treatment with [name].  Diabetes II Erin Roach has a diagnosis of diabetes type II. Erin Roach states fasting BGs range between 118 and 160 (mostly 120 and 130's) and 2 hour post prandial range between 125 and 162. She denies any hypoglycemic episodes. Last A1c was 8.2. She has been working on intensive lifestyle modifications including diet, exercise, and weight loss to help control her blood glucose levels.  Vitamin D deficiency Erin Roach has a diagnosis of vitamin D deficiency. She is on OTC Vit D 2,000 IU daily, last level not at goal. She notes fatigue and denies nausea, vomiting or muscle weakness.  Hyperlipidemia Erin Roach has hyperlipidemia and has been attempting to diet control to improve her cholesterol levels with intensive lifestyle modification including a low saturated fat diet, exercise and weight loss. She is on Zocor and denies any chest pain, claudication or myalgias.   Elevated LFT Erin Roach has a diagnosis of elevated ALT. She is attempting to improve with diet and she is due for labs. She denies abdominal pain or jaundice and has never been told of any liver problems in the past. She denies excessive alcohol intake.  ALLERGIES: No Known Allergies  MEDICATIONS: Current  Outpatient Medications on File Prior to Visit  Medication Sig Dispense Refill  . Blood Glucose Monitoring Suppl (ONE TOUCH ULTRA 2) w/Device KIT USE TO TEST BLOOD SUGARS UP TO 4 TIMES DAILY 1 each 0  . Cholecalciferol (VITAMIN D) 2000 units tablet Take 1 tablet (2,000 Units total) by mouth daily.    . fluticasone (FLONASE) 50 MCG/ACT nasal spray Place 1 spray into both nostrils daily. 16 g 0  . furosemide (LASIX) 20 MG tablet TAKE ONE TABLET BY MOUTH DAILY (Patient taking differently: TAKE TWO TABLET BY MOUTH DAILY-- takes in am) 90 tablet 3  . glucose blood (ACCU-CHEK AVIVA) test strip Use as instructed 100 each 0  . Insulin Pen Needle (BD PEN NEEDLE NANO U/F) 32G X 4 MM MISC 1 Package by Does not apply route daily at 12 noon. 100 each 0  . liraglutide (VICTOZA) 18 MG/3ML SOPN Inject 0.1 mLs (0.6 mg total) into the skin every morning. 1 pen 0  . losartan (COZAAR) 100 MG tablet TAKE ONE TABLET BY MOUTH DAILY 90 tablet 1  . metFORMIN (GLUCOPHAGE) 500 MG tablet Take 1 tablet (500 mg total) by mouth 2 (two) times daily with a meal. 60 tablet 0  . ONETOUCH DELICA LANCETS 99B MISC USE AS DIRECTED UP TO 4 TIMES A DAY 100 each 1  . ONETOUCH DELICA LANCETS 71I MISC USE AS DIRECTED UP TO 4 TIMES A DAY 100 each 5  . pramipexole (MIRAPEX) 0.25 MG tablet Take 0.5  mg by mouth 2 (two) times daily. 1630  And  Bedtime    . pregabalin (LYRICA) 75 MG capsule TAKE 1 CAPSULE BY MOUTH IN AFTERNOON, AND 2 CAPSULES AT BEDTIME 270 capsule 1  . simvastatin (ZOCOR) 40 MG tablet TAKE ONE TABLET BY MOUTH AT BEDTIME 90 tablet 1  . zolpidem (AMBIEN) 5 MG tablet Take 1 tablet (5 mg total) by mouth at bedtime as needed for sleep. Half tab at bedtime 30 tablet 0  . [DISCONTINUED] VESICARE 10 MG tablet TAKE 1 TABLET EVERY DAY 30 tablet 0   No current facility-administered medications on file prior to visit.     PAST MEDICAL HISTORY: Past Medical History:  Diagnosis Date  . Allergic rhinitis   . Apnea   . Arthritis   .  Asthma    very mild  . Back pain   . Bilateral lower extremity edema   . Carotid artery disease ([place])    a. Carotid duplex 03/2016 - duplex was stable, 54-62% RICA, 7-03% LICA  . Complication of anesthesia    post-op delirium  . Endometrial polyp   . Essential hypertension   . GERD (gastroesophageal reflux disease)   . Heart murmur    per pt  . History of adenomatous polyp of colon    04-24-2016  tubular adenoma  . History of squamous cell carcinoma excision    pilonidal area second excision 02-02-2002  . HTN (hypertension)   . Hyperlipidemia   . Leg pain   . OAB (overactive bladder)   . RA (rheumatoid arthritis) ([place])   . RLS (restless legs syndrome)   . Shortness of breath on exertion   . SUI (stress urinary incontinence, female)   . Venous insufficiency    legs  . Vitamin D deficiency     PAST SURGICAL HISTORY: Past Surgical History:  Procedure Laterality Date  . APPENDECTOMY  07-02-2005   [name]   open  . CARDIAC CATHETERIZATION  01-23-2009  dr Darnell Level brodie   normal coronary arteries and lvf  . CARDIOVASCULAR STRESS TEST  03/31/2016   Low risk nuclear study w/ medium defect of mild severity in basal anterior and mid anterior location w/ no evidence ischemia or infarction/  normal LV function and wall motion,  nuclear stress ef 71%  . CATARACT EXTRACTION W/ INTRAOCULAR LENS IMPLANT Left 09/2016  . COLONOSCOPY  last one 04-24-2016  . HYSTEROSCOPY W/D&C N/A 12/22/2016   Procedure: DILATATION AND CURETTAGE /HYSTEROSCOPY;  Surgeon: Dian Queen, MD;  Location: [facility];  Service: Gynecology;  Laterality: N/A;  . POSTERIOR LUMBAR FUSION  02/ 2018    [facility] ([place] )  . SKIN CANCER EXCISION    . TONSILLECTOMY  child  . 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 TRANSTHORACIC ECHOCARDIOGRAM  01/21/2009    mild LVH, ef 50-09%, grade 1 diastolic dysfunction  . ULNAR NERVE TRANSPOSITION Left 12-20-2008  dr sypher   decompression and partial resection medial triceps fascia  . WIDE LOCAL EXCISION PILONIDAL AREA  02-02-2002  [name]   recurrent squamous cell carcinoma in situ    SOCIAL HISTORY: Social History   Tobacco Use  . Smoking status: Former Smoker    Packs/day: 0.50    Years: 10.00    Pack years: 5.00    Types: Cigarettes    Last attempt to quit: 12/10/1973  Years since quitting: 43.5  . Smokeless tobacco: Never Used  Substance Use Topics  . Alcohol use: Yes    Comment: occasional  . Drug use: No    FAMILY HISTORY: Family History  Problem Relation Age of Onset  . Esophageal cancer Father   . Heart disease Father        Unknown what kind  . Hyperlipidemia Father   . Hypertension Father   . Cancer Father   . Heart failure Father   . Hyperlipidemia Mother   . Hypertension Mother   . CAD Sister        52 years younger than patient - had stent. Was told that her anorexia/bulimia may have played a role  . Stroke Maternal Grandmother 66  . Diabetes Maternal Grandmother   . Heart attack Maternal Grandfather 93  . COPD Maternal Grandfather   . Heart attack Paternal Grandmother        25s  . Diabetes Unknown        MGGM    ROS: Review of Systems  Constitutional: Positive for malaise/fatigue and weight loss.  Eyes:       Negative jaundice  Cardiovascular: Negative for chest pain and claudication.  Gastrointestinal: Negative for abdominal pain, nausea and vomiting.  Musculoskeletal: Negative for myalgias.       Negative muscle weakness    PHYSICAL EXAM: Blood pressure 129/77, pulse 79, temperature 98.1 F (36.7 C), temperature source Oral, height '5\' 4"'  (1.626 m), weight 198 lb (89.8 kg), SpO2 100 %. Body mass index is 33.99 kg/m. Physical Exam  Constitutional: She is oriented to person, place, and time. She appears well-developed and well-nourished.    Cardiovascular: Normal rate.  Pulmonary/Chest: Effort normal.  Musculoskeletal: Normal range of motion.  Neurological: She is oriented to person, place, and time.  Skin: Skin is warm and dry.  Psychiatric: She has a normal mood and affect. Her behavior is normal.  Vitals reviewed.   RECENT LABS AND TESTS: BMET    Component Value Date/Time   NA 138 02/23/2017 1007   K 4.2 02/23/2017 1007   CL 101 02/23/2017 1007   CO2 22 02/23/2017 1007   GLUCOSE 187 (H) 02/23/2017 1007   GLUCOSE 147 (H) 12/08/2016 1436   BUN 14 02/23/2017 1007   CREATININE 0.74 02/23/2017 1007   CALCIUM 8.6 (L) 02/23/2017 1007   GFRNONAA 80 02/23/2017 1007   GFRAA 92 02/23/2017 1007   Lab Results  Component Value Date   HGBA1C 8.2 (H) 02/23/2017   HGBA1C 5.6 12/08/2016   HGBA1C 5.3 06/04/2016   HGBA1C 5.2 01/08/2015   HGBA1C 5.5 08/22/2010   Lab Results  Component Value Date   INSULIN 30.7 (H) 02/23/2017   CBC    Component Value Date/Time   WBC 4.9 02/23/2017 1007   WBC 6.8 12/15/2016 1159   RBC 3.86 02/23/2017 1007   RBC 4.23 12/15/2016 1159   HGB 11.6 02/23/2017 1007   HCT 33.9 (L) 02/23/2017 1007   PLT 242 12/15/2016 1159   MCV 88 02/23/2017 1007   MCH 30.1 02/23/2017 1007   MCH 30.3 12/15/2016 1159   MCHC 34.2 02/23/2017 1007   MCHC 34.8 12/15/2016 1159   RDW 15.0 02/23/2017 1007   LYMPHSABS 1.4 02/23/2017 1007   MONOABS 0.5 06/04/2016 0808   EOSABS 0.2 02/23/2017 1007   BASOSABS 0.0 02/23/2017 1007   Iron/TIBC/Ferritin/ %Sat    Component Value Date/Time   IRON 109 03/02/2017 1035   TIBC 333 03/19/2015 1448   FERRITIN  218.0 03/02/2017 1035   IRONPCTSAT 15 03/19/2015 1448   Lipid Panel     Component Value Date/Time   CHOL 188 02/23/2017 1007   TRIG 212 (H) 02/23/2017 1007   TRIG 101 05/19/2006 0832   HDL 48 02/23/2017 1007   CHOLHDL 4 06/04/2016 0808   VLDL 31.4 06/04/2016 0808   LDLCALC 98 02/23/2017 1007   LDLDIRECT 141.3 09/06/2012 0907   Hepatic Function Panel      Component Value Date/Time   PROT 6.2 02/23/2017 1007   ALBUMIN 3.9 02/23/2017 1007   AST 41 (H) 02/23/2017 1007   ALT 58 (H) 02/23/2017 1007   ALKPHOS 84 02/23/2017 1007   BILITOT 0.5 02/23/2017 1007   BILIDIR 0.1 12/04/2013 1724      Component Value Date/Time   TSH 4.810 (H) 02/23/2017 1007   TSH 4.22 06/04/2016 0808   TSH 3.23 10/18/2014 1056  Results for RENITA, BROCKS (MRN [id number]) as of 06/09/2017 09:48  Ref. Range 02/23/2017 10:07  Vitamin D, 25-Hydroxy Latest Ref Range: 30.0 - 100.0 ng/mL 30.2    ASSESSMENT AND PLAN: Type 2 diabetes mellitus without complication, without long-term current use of insulin (HCC) - Plan: Comprehensive metabolic panel, Hemoglobin A1c, Insulin, random, Microalbumin / creatinine urine ratio  Vitamin D deficiency - Plan: VITAMIN D 25 Hydroxy (Vit-D Deficiency, Fractures)  Other hyperlipidemia - Plan: Lipid Panel With LDL/HDL Ratio  Elevated LFTs  Class 1 obesity with serious comorbidity and body mass index (BMI) of 34.0 to 34.9 in adult, unspecified obesity type  PLAN:  Diabetes II Gidget has been given extensive diabetes education by myself today including ideal fasting and post-prandial blood glucose readings, individual ideal Hgb A1c goals and hypoglycemia prevention. We discussed the importance of good blood sugar control to decrease the likelihood of diabetic complications such as nephropathy, neuropathy, limb loss, blindness, coronary artery disease, and death. We discussed the importance of intensive lifestyle modification including diet, exercise and weight loss as the first line treatment for diabetes. Allene agrees to continue her diabetes medications as prescribed. We will check labs and Makynlie agrees to follow up with our clinic in 2 weeks.  Vitamin D Deficiency Kynslee was informed that low vitamin D levels contributes to fatigue and are associated with obesity, breast, and colon cancer. Jakiera agrees to continue taking OTC Vit D and  will follow up for routine testing of vitamin D, at least 2-3 times per year. She was informed of the risk of over-replacement of vitamin D and agrees to not increase her dose unless she discusses this with [name] first. We will check labs and Jaidalyn agrees to follow up with our clinic in 2 weeks.  Hyperlipidemia Taleia was informed of the American Heart Association Guidelines emphasizing intensive lifestyle modifications as the first line treatment for hyperlipidemia. We discussed many lifestyle modifications today in depth, and Cherysh will continue to work on decreasing saturated fats such as fatty red meat, butter and many fried foods. She will also increase vegetables and lean protein in her diet and continue to work on diet, exercise, and weight loss efforts. We will check labs and Teliah agrees to follow up with our clinic in 2 weeks.  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 Ares was educated on her risk of developing NASH or even liver failure and th only proven treatment for NAFLD was weight loss. Yarissa agreed to continue with her weight loss efforts with  healthier diet and exercise as an essential part of her treatment plan. We will check labs and Deonna agrees to follow up with our clinic in 2 weeks.  Obesity Kadeisha is currently in the action stage of change. As such, her goal is to continue with weight loss efforts She has agreed to follow the Category 2 plan Rhyleigh has been instructed to work up to a goal of 150 minutes of combined cardio and strengthening exercise per week for weight loss and overall health benefits. We discussed the following Behavioral Modification Strategies today: increasing lean protein intake, work on meal planning and easy cooking plans and emotional eating strategies   Adelynn has agreed to follow up with our clinic in 2 weeks. She was informed of the importance of frequent follow up  visits to maximize her success with intensive lifestyle modifications for her multiple health conditions.   OBESITY BEHAVIORAL INTERVENTION VISIT  Today's visit was # 8 out of 22.  Starting weight: 223 lbs Starting date: 02/23/17 Today's weight : 198 lbs  Today's date: 06/09/2017 Total lbs lost to date: 25 (Patients must lose 7 lbs in the first 6 months to continue with counseling)   ASK: We discussed the diagnosis of obesity with [name] today and [name] agreed to give [name] permission to discuss obesity behavioral modification therapy today.  ASSESS: Kerston has the diagnosis of obesity and her BMI today is 33.97 Erna is in the action stage of change   ADVISE: Adria was educated on the multiple health risks of obesity as well as the benefit of weight loss to improve her health. She was advised of the need for long term treatment and the importance of lifestyle modifications.  AGREE: Multiple dietary modification options and treatment options were discussed and  Georgette agreed to the above obesity treatment plan.  I, Trixie Dredge, am acting as transcriptionist for Dennard Nip, MD  I have reviewed the above documentation for accuracy and completeness, and I agree with the above. -Dennard Nip, MD

## 2017-06-09 NOTE — Telephone Encounter (Signed)
[place] Controlled Substance Database checked. Last filled on 05/19/17

## 2017-06-10 ENCOUNTER — Other Ambulatory Visit (INDEPENDENT_AMBULATORY_CARE_PROVIDER_SITE_OTHER): Payer: Self-pay | Admitting: Family Medicine

## 2017-06-10 DIAGNOSIS — G4709 Other insomnia: Secondary | ICD-10-CM

## 2017-06-10 LAB — COMPREHENSIVE METABOLIC PANEL
ALT: 23 IU/L (ref 0–32)
AST: 18 IU/L (ref 0–40)
Albumin/Globulin Ratio: 1.8 (ref 1.2–2.2)
Albumin: 4.1 g/dL (ref 3.5–4.8)
Alkaline Phosphatase: 80 IU/L (ref 39–117)
BUN/Creatinine Ratio: 27 (ref 12–28)
BUN: 18 mg/dL (ref 8–27)
Bilirubin Total: 0.3 mg/dL (ref 0.0–1.2)
CALCIUM: 9.6 mg/dL (ref 8.7–10.3)
CO2: 25 mmol/L (ref 20–29)
CREATININE: 0.67 mg/dL (ref 0.57–1.00)
Chloride: 100 mmol/L (ref 96–106)
GFR, EST AFRICAN AMERICAN: 100 mL/min/{1.73_m2} (ref >59)
GFR, EST NON AFRICAN AMERICAN: 87 mL/min/{1.73_m2} (ref >59)
GLUCOSE: 100 mg/dL — AB (ref 65–99)
Globulin, Total: 2.3 g/dL (ref 1.5–4.5)
Potassium: 4.6 mmol/L (ref 3.5–5.2)
Sodium: 140 mmol/L (ref 134–144)
TOTAL PROTEIN: 6.4 g/dL (ref 6.0–8.5)

## 2017-06-10 LAB — LIPID PANEL WITH LDL/HDL RATIO
CHOLESTEROL TOTAL: 169 mg/dL (ref 100–199)
HDL: 52 mg/dL (ref >39)
LDL Calculated: 88 mg/dL (ref 0–99)
LDl/HDL Ratio: 1.7 ratio (ref 0.0–3.2)
TRIGLYCERIDES: 147 mg/dL (ref 0–149)
VLDL CHOLESTEROL CAL: 29 mg/dL (ref 5–40)

## 2017-06-10 LAB — MICROALBUMIN / CREATININE URINE RATIO
Creatinine, Urine: 59.1 mg/dL
MICROALB/CREAT RATIO: 73.8 mg/g{creat} — AB (ref 0.0–30.0)
Microalbumin, Urine: 43.6 ug/mL

## 2017-06-10 LAB — HEMOGLOBIN A1C
Est. average glucose Bld gHb Est-mCnc: 111 mg/dL
Hgb A1c MFr Bld: 5.5 % (ref 4.8–5.6)

## 2017-06-10 LAB — INSULIN, RANDOM: INSULIN: 28.3 u[IU]/mL — AB (ref 2.6–24.9)

## 2017-06-10 LAB — VITAMIN D 25 HYDROXY (VIT D DEFICIENCY, FRACTURES): VIT D 25 HYDROXY: 38.1 ng/mL (ref 30.0–100.0)

## 2017-06-11 ENCOUNTER — Ambulatory Visit: Payer: Medicare Other | Admitting: Internal Medicine

## 2017-06-12 ENCOUNTER — Encounter: Payer: Self-pay | Admitting: Internal Medicine

## 2017-06-13 ENCOUNTER — Other Ambulatory Visit: Payer: Self-pay | Admitting: Physical Medicine & Rehabilitation

## 2017-06-14 ENCOUNTER — Other Ambulatory Visit: Payer: Self-pay | Admitting: Physical Medicine & Rehabilitation

## 2017-06-15 ENCOUNTER — Encounter: Payer: Self-pay | Admitting: Internal Medicine

## 2017-06-15 ENCOUNTER — Ambulatory Visit: Payer: Medicare Other | Admitting: Internal Medicine

## 2017-06-15 ENCOUNTER — Ambulatory Visit: Payer: Medicare Other | Admitting: *Deleted

## 2017-06-15 VITALS — BP 124/80 | HR 94 | Temp 98.4°F | Resp 16 | Wt 202.0 lb

## 2017-06-15 DIAGNOSIS — G2581 Restless legs syndrome: Secondary | ICD-10-CM

## 2017-06-15 DIAGNOSIS — I10 Essential (primary) hypertension: Secondary | ICD-10-CM

## 2017-06-15 DIAGNOSIS — G4733 Obstructive sleep apnea (adult) (pediatric): Secondary | ICD-10-CM | POA: Diagnosis not present

## 2017-06-15 DIAGNOSIS — E119 Type 2 diabetes mellitus without complications: Secondary | ICD-10-CM | POA: Diagnosis not present

## 2017-06-15 DIAGNOSIS — E7849 Other hyperlipidemia: Secondary | ICD-10-CM

## 2017-06-15 DIAGNOSIS — Z9989 Dependence on other enabling machines and devices: Secondary | ICD-10-CM | POA: Diagnosis not present

## 2017-06-15 DIAGNOSIS — Z0000 Encounter for general adult medical examination without abnormal findings: Secondary | ICD-10-CM

## 2017-06-15 NOTE — Assessment & Plan Note (Signed)
Lipid panel well controlled Continue statin 

## 2017-06-15 NOTE — Assessment & Plan Note (Signed)
Compliant with CPAP nightly.

## 2017-06-15 NOTE — Progress Notes (Signed)
Subjective:    Patient ID: Erin Roach, female    DOB: February 02, 1941, 77 y.o.   MRN: [id number]  HPI The patient is here for follow up.  OSA:  She is on cpap for the past 5 months.  She uses it nightly.  The mask is comfortable.  She was tested and started on CPAP by her RLS specialist.  She is not noticed much difference in her restless leg syndrome.  Hypertension: She is taking her medication daily. She is compliant with a low sodium diet.  She denies chest pain, palpitations, edema, shortness of breath and regular headaches. She is exercising regularly.     Hyperlipidemia: She is taking her medication daily. She is compliant with a low fat/cholesterol diet. She is exercising regularly. She denies myalgias.   Diabetes: She is taking her medication daily as prescribed. She is compliant with a diabetic diet. She is exercising regularly.   She is up-to-date with an ophthalmology examination.   Obesity:  She is taking the victoza and metformin.  She is following at the weight management program.  She has been successful and has lost weight.  Overall she feels good.  She feels she is eating healthy.  She is exercising regularly.  RLS:  She is taking the hydrocodone nightly.  She is taking mirapex.  She is taking lyrica.  She is using her cpap.  Medication works slightly.  She has been on several medications for the restless leg syndrome and nothing is worked perfectly.  She does have a follow-up appointment with the specialist.  Medications and allergies reviewed with patient and updated if appropriate.  Patient Active Problem List   Diagnosis Date Noted  . OSA on CPAP 06/15/2017  . Diabetes ([place]) 03/02/2017  . Herpes zoster without complication 78/58/8502  . Other fatigue 02/23/2017  . Shortness of breath on exertion 02/23/2017  . B12 nutritional deficiency 02/23/2017  . Vitamin D deficiency 12/08/2016  . Tubular adenoma of colon 04/29/2016  . Essential hypertension 09/12/2015  .  Allergic rhinitis 07/17/2015  . Insomnia 07/17/2015  . Obese 04/10/2015  . S/P left TKA 04/09/2015  . Osteopenia 12/04/2012  . 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 RESTLESS LEG SYNDROME 02/11/2009  . LEG EDEMA, BILATERAL 02/11/2009  . Hyperlipidemia 10/30/2008  . Urinary incontinence 10/30/2008  . Skin cancer 10/30/2008  . Asthma 11/21/2007  . Venous (peripheral) insufficiency 11/22/2006  . SNORING 11/22/2006    Current Outpatient Medications on File Prior to Visit  Medication Sig Dispense Refill  . Blood Glucose Monitoring Suppl (ONE TOUCH ULTRA 2) w/Device KIT USE TO TEST BLOOD SUGARS UP TO 4 TIMES DAILY 1 each 0  . Cholecalciferol (VITAMIN D) 2000 units tablet Take 1 tablet (2,000 Units total) by mouth daily.    . fluticasone (FLONASE) 50 MCG/ACT nasal spray Place 1 spray into both nostrils daily. 16 g 0  . furosemide (LASIX) 20 MG tablet TAKE ONE TABLET BY MOUTH DAILY (Patient taking differently: TAKE TWO TABLET BY MOUTH DAILY-- takes in am) 90 tablet 3  . glucose blood (ACCU-CHEK AVIVA) test strip Use as instructed 100 each 0  . HYDROcodone-acetaminophen (NORCO/VICODIN) 5-325 MG tablet Take 1 tablet by mouth at bedtime. 30 tablet 0  . Insulin Pen Needle (BD PEN NEEDLE NANO U/F) 32G X 4 MM MISC 1 Package by Does not apply route daily at 12 noon. 100 each 0  . liraglutide (VICTOZA) 18 MG/3ML SOPN Inject 0.1 mLs (0.6 mg total) into the skin every morning. 1  pen 0  . losartan (COZAAR) 100 MG tablet TAKE ONE TABLET BY MOUTH DAILY 90 tablet 1  . metFORMIN (GLUCOPHAGE) 500 MG tablet Take 1 tablet (500 mg total) by mouth 2 (two) times daily with a meal. 60 tablet 0  . ONETOUCH DELICA LANCETS 33I MISC USE AS DIRECTED UP TO 4 TIMES A DAY 100 each 1  . ONETOUCH DELICA LANCETS 95J MISC USE AS DIRECTED UP TO 4 TIMES A DAY 100 each 5  . pramipexole (MIRAPEX) 0.25 MG tablet Take 0.5 mg by mouth 2 (two) times daily. 1630  And  Bedtime    . pregabalin (LYRICA) 75 MG capsule TAKE 1 CAPSULE BY MOUTH IN  AFTERNOON, AND 2 CAPSULES AT BEDTIME 270 capsule 1  . simvastatin (ZOCOR) 40 MG tablet TAKE ONE TABLET BY MOUTH AT BEDTIME 90 tablet 1  . zolpidem (AMBIEN) 5 MG tablet Take 1 tablet (5 mg total) by mouth at bedtime as needed for sleep. Half tab at bedtime 30 tablet 0  . [DISCONTINUED] VESICARE 10 MG tablet TAKE 1 TABLET EVERY DAY 30 tablet 0   No current facility-administered medications on file prior to visit.     Past Medical History:  Diagnosis Date  . Allergic rhinitis   . Apnea   . Arthritis   . Asthma    very mild  . Back pain   . Bilateral lower extremity edema   . Carotid artery disease ([place])    a. Carotid duplex 03/2016 - duplex was stable, 88-41% RICA, 6-60% LICA  . Complication of anesthesia    post-op delirium  . Endometrial polyp   . Essential hypertension   . GERD (gastroesophageal reflux disease)   . Heart murmur    per pt  . History of adenomatous polyp of colon    04-24-2016  tubular adenoma  . History of squamous cell carcinoma excision    pilonidal area second excision 02-02-2002  . HTN (hypertension)   . Hyperlipidemia   . Leg pain   . OAB (overactive bladder)   . RA (rheumatoid arthritis) ([place])   . RLS (restless legs syndrome)   . Shortness of breath on exertion   . SUI (stress urinary incontinence, female)   . Venous insufficiency    legs  . Vitamin D deficiency     Past Surgical History:  Procedure Laterality Date  . APPENDECTOMY  07-02-2005   [name]   open  . CARDIAC CATHETERIZATION  01-23-2009  dr Darnell Level brodie   normal coronary arteries and lvf  . CARDIOVASCULAR STRESS TEST  03/31/2016   Low risk nuclear study w/ medium defect of mild severity in basal anterior and mid anterior location w/ no evidence ischemia or infarction/  normal LV function and wall motion,  nuclear stress ef 71%  . CATARACT EXTRACTION W/ INTRAOCULAR LENS IMPLANT Left 09/2016  . COLONOSCOPY  last one 04-24-2016  . HYSTEROSCOPY W/D&C N/A 12/22/2016   Procedure:  DILATATION AND CURETTAGE /HYSTEROSCOPY;  Surgeon: Dian Queen, MD;  Location: [facility];  Service: Gynecology;  Laterality: N/A;  . POSTERIOR LUMBAR FUSION  02/ 2018    [facility] ([place])  . SKIN CANCER EXCISION    . TONSILLECTOMY  child  . 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 TRANSTHORACIC ECHOCARDIOGRAM  01/21/2009   mild LVH, ef 63-01%, grade 1 diastolic dysfunction  . ULNAR NERVE  TRANSPOSITION Left 12-20-2008  dr sypher   decompression and partial resection medial triceps fascia  . WIDE LOCAL EXCISION PILONIDAL AREA  02-02-2002  [name]   recurrent squamous cell carcinoma in situ    Social History   Socioeconomic History  . Marital status: Married    Spouse name: Clare Gandy  . Number of children: 2  . Years of education: Not on file  . Highest education level: Not on file  Social Needs  . Financial resource strain: Not on file  . Food insecurity - worry: Not on file  . Food insecurity - inability: Not on file  . Transportation needs - medical: Not on file  . Transportation needs - non-medical: Not on file  Occupational History  . Occupation: retired Pharmacist, hospital  Tobacco Use  . Smoking status: Former Smoker    Packs/day: 0.50    Years: 10.00    Pack years: 5.00    Types: Cigarettes    Last attempt to quit: 12/10/1973    Years since quitting: 43.5  . Smokeless tobacco: Never Used  Substance and Sexual Activity  . Alcohol use: Yes    Comment: occasional  . Drug use: No  . Sexual activity: Not on file  Other Topics Concern  . Not on file  Social History Narrative   2 children ages 15,35   Regular exercise no    Family History  Problem Relation Age of Onset  . Esophageal cancer Father   . Heart disease Father        Unknown what kind  . Hyperlipidemia Father   . Hypertension Father   . Cancer  Father   . Heart failure Father   . Hyperlipidemia Mother   . Hypertension Mother   . CAD Sister        48 years younger than patient - had stent. Was told that her anorexia/bulimia may have played a role  . Stroke Maternal Grandmother 66  . Diabetes Maternal Grandmother   . Heart attack Maternal Grandfather 93  . COPD Maternal Grandfather   . Heart attack Paternal Grandmother        62s  . Diabetes Unknown        MGGM    Review of Systems  Constitutional: Negative for chills, fatigue and fever.  Respiratory: Positive for cough (occ - related to asthma). Negative for shortness of breath and wheezing.   Cardiovascular: Positive for leg swelling. Negative for chest pain and palpitations.  Neurological: Negative for light-headedness and headaches.       Objective:   Vitals:   06/15/17 1349  BP: 124/80  Pulse: 94  Resp: 16  Temp: 98.4 F (36.9 C)  SpO2: 97%   Wt Readings from Last 3 Encounters:  06/15/17 202 lb (91.6 kg)  06/09/17 198 lb (89.8 kg)  05/24/17 200 lb (90.7 kg)   Body mass index is 34.67 kg/m.   Physical Exam    Constitutional: Appears well-developed and well-nourished. No distress.  HENT:  Head: Normocephalic and atraumatic.  Neck: Neck supple. No tracheal deviation present. No thyromegaly present.  No cervical lymphadenopathy Cardiovascular: Normal rate, regular rhythm and normal heart sounds.   No murmur heard. No carotid bruit .  No edema Pulmonary/Chest: Effort normal and breath sounds normal. No respiratory distress. No has no wheezes. No rales.  Skin: Skin is warm and dry. Not diaphoretic.  Psychiatric: Normal mood and affect. Behavior is normal.      Assessment & Plan:    See Problem List  for Assessment and Plan of chronic medical problems.

## 2017-06-15 NOTE — Progress Notes (Addendum)
Subjective:   Erin Roach is a 77 y.o. female who presents for Medicare Annual (Subsequent) preventive examination.  Review of Systems:  No ROS.  Medicare Wellness Visit. Additional risk factors are reflected in the social history.  Cardiac Risk Factors include: advanced age (>81mn, >>63women);diabetes mellitus;hypertension;obesity (BMI >30kg/m2)  Sleep patterns: gets up 1 times nightly to void and sleeps 6-7 hours nightly.   Home Safety/Smoke Alarms: Feels safe in home. Smoke alarms in place.   Living environment; residence and Firearm Safety: 2-story house, no firearms.Lives with husband, no needs for DME, good support system Seat Belt Safety/Bike Helmet: Wears seat belt.     Objective:     Vitals: There were no vitals taken for this visit.  There is no height or weight on file to calculate BMI.  Advanced Directives 06/15/2017 12/22/2016 12/10/2016 10/23/2016 07/28/2016 04/09/2015 03/27/2015  Does Patient Have a Medical Advance Directive? Yes Yes Yes Yes Yes Yes Yes  Type of AParamedicof A[place]Living will HArg[place]Living will HFalling SpringLiving will HElmd[place]Living will HTwins[place]Living will HHookerLiving will H[place]Living will  Does patient want to make changes to medical advance directive? - No - Patient declined - - - No - Patient declined No - Patient declined  Copy of H[place]in Chart? No - copy requested No - copy requested No - copy requested Yes No - copy requested Yes Yes    Tobacco Social History   Tobacco Use  Smoking Status Former Smoker  . Packs/day: 0.50  . Years: 10.00  . Pack years: 5.00  . Types: Cigarettes  . Last attempt to quit: 12/10/1973  . Years since quitting: 43.5  Smokeless Tobacco Never Used     Counseling given: Not Answered  Past Medical History:  Diagnosis Date  . Allergic  rhinitis   . Apnea   . Arthritis   . Asthma    very mild  . Back pain   . Bilateral lower extremity edema   . Carotid artery disease ([place]    a. Carotid duplex 03/2016 - duplex was stable, 490-24%RICA, 10-97%LICA  . Complication of anesthesia    post-op delirium  . Endometrial polyp   . Essential hypertension   . GERD (gastroesophageal reflux disease)   . Heart murmur    per pt  . History of adenomatous polyp of colon    04-24-2016  tubular adenoma  . History of squamous cell carcinoma excision    pilonidal area second excision 02-02-2002  . HTN (hypertension)   . Hyperlipidemia   . Leg pain   . OAB (overactive bladder)   . RA (rheumatoid arthritis) ([place]   . RLS (restless legs syndrome)   . Shortness of breath on exertion   . SUI (stress urinary incontinence, female)   . Venous insufficiency    legs  . Vitamin D deficiency    Past Surgical History:  Procedure Laterality Date  . APPENDECTOMY  07-02-2005   dr wRise Patience  open  . CARDIAC CATHETERIZATION  01-23-2009  dr bDarnell Levelbrodie   normal coronary arteries and lvf  . CARDIOVASCULAR STRESS TEST  03/31/2016   Low risk nuclear study w/ medium defect of mild severity in basal anterior and mid anterior location w/ no evidence ischemia or infarction/  normal LV function and wall motion,  nuclear stress ef 71%  . CATARACT EXTRACTION W/ INTRAOCULAR LENS IMPLANT  Left 09/2016  . COLONOSCOPY  last one 04-24-2016  . HYSTEROSCOPY W/D&C N/A 12/22/2016   Procedure: DILATATION AND CURETTAGE /HYSTEROSCOPY;  Surgeon: Dian Queen, MD;  Location[facility];  Service: Gynecology;  Laterality: N/A;  . POSTERIOR LUMBAR FUSION  02/ 2018    [facility] ([place], [place])  . SKIN CANCER EXCISION    . TONSILLECTOMY  child  . 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 TRANSTHORACIC  ECHOCARDIOGRAM  01/21/2009   mild LVH, ef 33-00%, grade 1 diastolic dysfunction  . ULNAR NERVE TRANSPOSITION Left 12-20-2008  dr sypher   decompression and partial resection medial triceps fascia  . WIDE LOCAL EXCISION PILONIDAL AREA  02-02-2002  [name]   recurrent squamous cell carcinoma in situ   Family History  Problem Relation Age of Onset  . Esophageal cancer Father   . Heart disease Father        Unknown what kind  . Hyperlipidemia Father   . Hypertension Father   . Cancer Father   . Heart failure Father   . Hyperlipidemia Mother   . Hypertension Mother   . CAD Sister        82 years younger than patient - had stent. Was told that her anorexia/bulimia may have played a role  . Stroke Maternal Grandmother 66  . Diabetes Maternal Grandmother   . Heart attack Maternal Grandfather 93  . COPD Maternal Grandfather   . Heart attack Paternal Grandmother        52s  . Diabetes Unknown        MGGM   Social History   Socioeconomic History  . Marital status: Married    Spouse name: Clare Gandy  . Number of children: 2  . Years of education: Not on file  . Highest education level: Not on file  Social Needs  . Financial resource strain: Not on file  . Food insecurity - worry: Not on file  . Food insecurity - inability: Not on file  . Transportation needs - medical: Not on file  . Transportation needs - non-medical: Not on file  Occupational History  . Occupation: retired Pharmacist, hospital  Tobacco Use  . Smoking status: Former Smoker    Packs/day: 0.50    Years: 10.00    Pack years: 5.00    Types: Cigarettes    Last attempt to quit: 12/10/1973    Years since quitting: 43.5  . Smokeless tobacco: Never Used  Substance and Sexual Activity  . Alcohol use: Yes    Comment: occasional  . Drug use: No  . Sexual activity: Not on file  Other Topics Concern  . Not on file  Social History Narrative   2 children ages 57,35   Regular exercise no    Outpatient Encounter Medications as of  06/15/2017  Medication Sig  . Blood Glucose Monitoring Suppl (ONE TOUCH ULTRA 2) w/Device KIT USE TO TEST BLOOD SUGARS UP TO 4 TIMES DAILY  . Cholecalciferol (VITAMIN D) 2000 units tablet Take 1 tablet (2,000 Units total) by mouth daily.  . fluticasone (FLONASE) 50 MCG/ACT nasal spray Place 1 spray into both nostrils daily.  . furosemide (LASIX) 20 MG tablet TAKE ONE TABLET BY MOUTH DAILY (Patient taking differently: TAKE TWO TABLET BY MOUTH DAILY-- takes in am)  . glucose blood (ACCU-CHEK AVIVA) test strip Use as instructed  . HYDROcodone-acetaminophen (NORCO/VICODIN) 5-325 MG  tablet Take 1 tablet by mouth at bedtime.  . Insulin Pen Needle (BD PEN NEEDLE NANO U/F) 32G X 4 MM MISC 1 Package by Does not apply route daily at 12 noon.  . liraglutide (VICTOZA) 18 MG/3ML SOPN Inject 0.1 mLs (0.6 mg total) into the skin every morning.  [name] losartan (COZAAR) 100 MG tablet TAKE ONE TABLET BY MOUTH DAILY  . metFORMIN (GLUCOPHAGE) 500 MG tablet Take 1 tablet (500 mg total) by mouth 2 (two) times daily with a meal.  . ONETOUCH DELICA LANCETS 61Y MISC USE AS DIRECTED UP TO 4 TIMES A DAY  . ONETOUCH DELICA LANCETS 07P MISC USE AS DIRECTED UP TO 4 TIMES A DAY  . pramipexole (MIRAPEX) 0.25 MG tablet Take 0.5 mg by mouth 2 (two) times daily. 1630  And  Bedtime  . pregabalin (LYRICA) 75 MG capsule TAKE 1 CAPSULE BY MOUTH IN AFTERNOON, AND 2 CAPSULES AT BEDTIME  . simvastatin (ZOCOR) 40 MG tablet TAKE ONE TABLET BY MOUTH AT BEDTIME  . zolpidem (AMBIEN) 5 MG tablet Take 1 tablet (5 mg total) by mouth at bedtime as needed for sleep. Half tab at bedtime  . [DISCONTINUED] VESICARE 10 MG tablet TAKE 1 TABLET EVERY DAY   No facility-administered encounter medications on file as of 06/15/2017.     Activities of Daily Living In your present state of health, do you have any difficulty performing the following activities: 06/15/2017 12/22/2016  Hearing? N N  Vision? N N  Difficulty concentrating or making decisions? N N    Walking or climbing stairs? N N  Dressing or bathing? N N  Doing errands, shopping? N -  Preparing Food and eating ? N -  Using the Toilet? N -  In the past six months, have you accidently leaked urine? N -  Do you have problems with loss of bowel control? N -  Managing your Medications? N -  Managing your Finances? N -  Housekeeping or managing your Housekeeping? N -  Some recent data might be hidden    Patient Care Team: Binnie Rail, MD as PCP - General (Internal Medicine)    Assessment:   This is a routine wellness examination for [place]. Physical assessment deferred to PCP.   Exercise Activities and Dietary recommendations Current Exercise Habits: Home exercise routine, Type of exercise: walking(stationary bike), Time (Minutes): 40, Frequency (Times/Week): 3, Weekly Exercise (Minutes/Week): 120, Intensity: Mild  Diet (meal preparation, eat out, water intake, caffeinated beverages, dairy products, fruits and vegetables): in general, a "healthy" diet  , well balanced, currently under care of weight loss program Dr. Leafy Ro, eats a variety of fruits and vegetables daily, limits salt, fat/cholesterol, sugar, caffeine, drinks 6-8 glasses of water daily.    Goals    None      Fall Risk Fall Risk  06/15/2017 12/10/2016 06/03/2016 02/20/2016 03/21/2015  Falls in the past year? No No No No No    Depression Screen PHQ 2/9 Scores 06/15/2017 02/23/2017 12/10/2016 06/03/2016  PHQ - 2 Score '2 6 3 ' 0  PHQ- 9 Score '4 16 18 ' -     Cognitive Function       Ad8 score reviewed for issues:  Issues making decisions: no  Less interest in hobbies / activities: no  Repeats questions, stories (family complaining): no  Trouble using ordinary gadgets (microwave, computer, phone):no  Forgets the month or year: no  Mismanaging finances: no  Remembering appts: no  Daily problems with thinking and/or memory: no Ad8 score is=  0    Immunization History  Administered Date(s) Administered   . Influenza Whole 03/13/2010, 02/11/2012  . Influenza, High Dose Seasonal PF 03/21/2015, 03/02/2017  . Influenza-Unspecified 02/08/2014, 03/19/2016  . Pneumococcal Conjugate-13 05/11/2001  . Pneumococcal Polysaccharide-23 04/12/2012  . Tdap 06/03/2016  . Zoster 05/11/2005   Screening Tests Health Maintenance  Topic Date Due  . FOOT EXAM  01/07/1951  . OPHTHALMOLOGY EXAM  01/07/1951  . HEMOGLOBIN A1C  12/07/2017  . MAMMOGRAM  07/15/2018  . DEXA SCAN  07/15/2019  . COLONOSCOPY  04/24/2026  . TETANUS/TDAP  06/03/2026  . INFLUENZA VACCINE  Completed  . PNA vac Low Risk Adult  Completed      Plan:     Continue doing brain stimulating activities (puzzles, reading, adult coloring books, staying active) to keep memory sharp.   Continue to eat heart healthy diet (full of fruits, vegetables, whole grains, lean protein, water--limit salt, fat, and sugar intake) and increase physical activity as tolerated.  I have personally reviewed and noted the following in the patient's chart:   . Medical and social history . Use of alcohol, tobacco or illicit drugs  . Current medications and supplements . Functional ability and status . Nutritional status . Physical activity . Advanced directives . List of other physicians . Vitals . Screenings to include cognitive, depression, and falls . Referrals and appointments  In addition, I have reviewed and discussed with patient certain preventive protocols, quality metrics, and best practice recommendations. A written personalized care plan for preventive services as well as general preventive health recommendations were provided to patient.     Michiel Cowboy, RN  06/15/2017    Medical screening examination/treatment/procedure(s) were performed by non-physician practitioner and as supervising physician I was immediately available for consultation/collaboration. I agree with above. Binnie Rail, MD

## 2017-06-15 NOTE — Assessment & Plan Note (Signed)
Taking Victoza and metformin Recent A1c was excellent She is compliant with a diabetic diet and is exercising regularly She has lost weight Continue above Has follow-up with the weight management clinic Follow-up in 6 months

## 2017-06-15 NOTE — Assessment & Plan Note (Signed)
Has follow-up with the restless leg specialist in [facility] and hydrocodone Using CPAP nightly

## 2017-06-15 NOTE — Assessment & Plan Note (Signed)
BP well controlled Current regimen effective and well tolerated Continue current medications at current doses Recent CMP-reviewed, normal kidney function and electrolytes

## 2017-06-15 NOTE — Patient Instructions (Addendum)
  Medications reviewed and updated.  No changes recommended at this time.     Please followup in 6 months   

## 2017-06-15 NOTE — Patient Instructions (Signed)
Continue doing brain stimulating activities (puzzles, reading, adult coloring books, staying active) to keep memory sharp.   Continue to eat heart healthy diet (full of fruits, vegetables, whole grains, lean protein, water--limit salt, fat, and sugar intake) and increase physical activity as tolerated.   Erin Roach , Thank you for taking time to come for your Medicare Wellness Visit. I appreciate your ongoing commitment to your health goals. Please review the following plan we discussed and let me know if I can assist you in the future.   These are the goals we discussed: Goals    . Patient Stated     Continue with the weight loss program and lose around 25 more pounds. Increase the amount of physical activity by working out more often and for longer periods.       This is a list of the screening recommended for you and due dates:  Health Maintenance  Topic Date Due  . Eye exam for diabetics  01/07/1951  . Hemoglobin A1C  12/07/2017  . Complete foot exam   06/15/2018  . Mammogram  07/15/2018  . DEXA scan (bone density measurement)  07/15/2019  . Colon Cancer Screening  04/24/2026  . Tetanus Vaccine  06/03/2026  . Flu Shot  Completed  . Pneumonia vaccines  Completed

## 2017-06-22 ENCOUNTER — Other Ambulatory Visit (INDEPENDENT_AMBULATORY_CARE_PROVIDER_SITE_OTHER): Payer: Self-pay | Admitting: Family Medicine

## 2017-06-22 DIAGNOSIS — E119 Type 2 diabetes mellitus without complications: Secondary | ICD-10-CM

## 2017-06-23 ENCOUNTER — Ambulatory Visit (INDEPENDENT_AMBULATORY_CARE_PROVIDER_SITE_OTHER): Payer: Medicare Other | Admitting: Family Medicine

## 2017-06-25 ENCOUNTER — Other Ambulatory Visit (INDEPENDENT_AMBULATORY_CARE_PROVIDER_SITE_OTHER): Payer: Self-pay | Admitting: Family Medicine

## 2017-06-25 DIAGNOSIS — E119 Type 2 diabetes mellitus without complications: Secondary | ICD-10-CM

## 2017-06-30 ENCOUNTER — Other Ambulatory Visit (INDEPENDENT_AMBULATORY_CARE_PROVIDER_SITE_OTHER): Payer: Self-pay | Admitting: Family Medicine

## 2017-06-30 DIAGNOSIS — E119 Type 2 diabetes mellitus without complications: Secondary | ICD-10-CM

## 2017-07-03 ENCOUNTER — Other Ambulatory Visit: Payer: Self-pay | Admitting: Internal Medicine

## 2017-07-05 ENCOUNTER — Encounter (INDEPENDENT_AMBULATORY_CARE_PROVIDER_SITE_OTHER): Payer: Self-pay | Admitting: Family Medicine

## 2017-07-05 ENCOUNTER — Ambulatory Visit (INDEPENDENT_AMBULATORY_CARE_PROVIDER_SITE_OTHER): Payer: Medicare Other | Admitting: Family Medicine

## 2017-07-05 VITALS — BP 128/75 | HR 82 | Temp 98.2°F | Ht 64.0 in | Wt 200.0 lb

## 2017-07-05 DIAGNOSIS — Z6834 Body mass index (BMI) 34.0-34.9, adult: Secondary | ICD-10-CM | POA: Diagnosis not present

## 2017-07-05 DIAGNOSIS — E119 Type 2 diabetes mellitus without complications: Secondary | ICD-10-CM | POA: Diagnosis not present

## 2017-07-05 DIAGNOSIS — J3089 Other allergic rhinitis: Secondary | ICD-10-CM | POA: Diagnosis not present

## 2017-07-05 DIAGNOSIS — E669 Obesity, unspecified: Secondary | ICD-10-CM

## 2017-07-05 DIAGNOSIS — Z9189 Other specified personal risk factors, not elsewhere classified: Secondary | ICD-10-CM

## 2017-07-05 MED ORDER — INSULIN PEN NEEDLE 32G X 4 MM MISC: 1.0000 | Freq: Every day | 0 refills | Status: DC

## 2017-07-05 MED ORDER — LIRAGLUTIDE 18 MG/3ML ~~LOC~~ SOPN: 0.9000 mg | PEN_INJECTOR | SUBCUTANEOUS | 0 refills | Status: DC

## 2017-07-05 MED ORDER — FLUTICASONE PROPIONATE 50 MCG/ACT NA SUSP: 1.0000 | Freq: Every day | NASAL | 0 refills | Status: DC

## 2017-07-05 MED ORDER — LIRAGLUTIDE 18 MG/3ML ~~LOC~~ SOPN: 1.2000 mg | PEN_INJECTOR | SUBCUTANEOUS | 0 refills | Status: DC

## 2017-07-05 MED ORDER — METFORMIN HCL 500 MG PO TABS: 500.0000 mg | ORAL_TABLET | Freq: Two times a day (BID) | ORAL | 0 refills | Status: DC

## 2017-07-06 NOTE — Progress Notes (Signed)
Office: [phone]  /  Fax: [phone]   HPI:   Chief Complaint: OBESITY Erin Roach is here to discuss her progress with her obesity treatment plan. She is on the Category 2 plan and is following her eating plan approximately 50 % of the time. She states she is bike riding, walking and doing back and leg exercises 20 minutes 5 times per week. Erin Roach increased traveling while dealing with her mothers house after she died. Erin Roach notes some increased stress eating, but she has done well controlling this overall. Her weight is 200 lb (90.7 kg) today and has had a weight gain of 2 pounds over a period of 4 weeks since her last visit. She has lost 23 lbs since starting treatment with [name].  Diabetes II Erin Roach has a diagnosis of diabetes type II. She is on Victoza 0.6 mg and fasting BGs are mostly in the 140 to 150's range. She still notes mild  polyphagia and denies any hypoglycemic events. She has been working on intensive lifestyle modifications including diet, exercise, and weight loss to help control her blood glucose levels.  At risk for cardiovascular disease Erin Roach is at a higher than average risk for cardiovascular disease due to obesity and diabetes. She currently denies any chest pain.  Allergic Rhinitis Erin Roach notes improvement in eye symptoms on flonase and requests a refill.  ALLERGIES: No Known Allergies  MEDICATIONS: Current Outpatient Medications on File Prior to Visit  Medication Sig Dispense Refill  . Blood Glucose Monitoring Suppl (ONE TOUCH ULTRA 2) w/Device KIT USE TO TEST BLOOD SUGARS UP TO 4 TIMES DAILY 1 each 0  . Cholecalciferol (VITAMIN D) 2000 units tablet Take 1 tablet (2,000 Units total) by mouth daily.    . furosemide (LASIX) 20 MG tablet TAKE ONE TABLET BY MOUTH DAILY (Patient taking differently: TAKE TWO TABLET BY MOUTH DAILY-- takes in am) 90 tablet 3  . glucose blood (ONE TOUCH ULTRA TEST) test strip USE AS INSTRUCTED TWICE DAILY 100 each 0  .  HYDROcodone-acetaminophen (NORCO/VICODIN) 5-325 MG tablet Take 1 tablet by mouth at bedtime. 30 tablet 0  . levocetirizine (XYZAL) 5 MG tablet TAKE 1 TABLET BY MOUTH EVERY EVENING. 63 tablet 2  . losartan (COZAAR) 100 MG tablet TAKE ONE TABLET BY MOUTH DAILY 90 tablet 1  . ONETOUCH DELICA LANCETS 09M MISC USE AS DIRECTED UP TO 4 TIMES A DAY 100 each 5  . pramipexole (MIRAPEX) 0.25 MG tablet Take 0.5 mg by mouth 2 (two) times daily. 1630  And  Bedtime    . pregabalin (LYRICA) 75 MG capsule TAKE 1 CAPSULE BY MOUTH IN AFTERNOON, AND 2 CAPSULES AT BEDTIME 270 capsule 1  . simvastatin (ZOCOR) 40 MG tablet TAKE ONE TABLET BY MOUTH AT BEDTIME 90 tablet 1  . zolpidem (AMBIEN) 5 MG tablet Take 1 tablet (5 mg total) by mouth at bedtime as needed for sleep. Half tab at bedtime 30 tablet 0  . [DISCONTINUED] VESICARE 10 MG tablet TAKE 1 TABLET EVERY DAY 30 tablet 0   No current facility-administered medications on file prior to visit.     PAST MEDICAL HISTORY: Past Medical History:  Diagnosis Date  . Allergic rhinitis   . Apnea   . Arthritis   . Asthma    very mild  . Back pain   . Bilateral lower extremity edema   . Carotid artery disease ([place])    a. Carotid duplex 03/2016 - duplex was stable, 07-68% RICA, 0-88% LICA  . Complication of anesthesia  post-op delirium  . Endometrial polyp   . Essential hypertension   . GERD (gastroesophageal reflux disease)   . Heart murmur    per pt  . History of adenomatous polyp of colon    04-24-2016  tubular adenoma  . History of squamous cell carcinoma excision    pilonidal area second excision 02-02-2002  . HTN (hypertension)   . Hyperlipidemia   . Leg pain   . OAB (overactive bladder)   . RA (rheumatoid arthritis) ([place])   . RLS (restless legs syndrome)   . Shortness of breath on exertion   . SUI (stress urinary incontinence, female)   . Venous insufficiency    legs  . Vitamin D deficiency     PAST SURGICAL HISTORY: Past Surgical History:    Procedure Laterality Date  . APPENDECTOMY  07-02-2005   [name]   open  . CARDIAC CATHETERIZATION  01-23-2009  dr Darnell Level brodie   normal coronary arteries and lvf  . CARDIOVASCULAR STRESS TEST  03/31/2016   Low risk nuclear study w/ medium defect of mild severity in basal anterior and mid anterior location w/ no evidence ischemia or infarction/  normal LV function and wall motion,  nuclear stress ef 71%  . CATARACT EXTRACTION W/ INTRAOCULAR LENS IMPLANT Left 09/2016  . COLONOSCOPY  last one 04-24-2016  . HYSTEROSCOPY W/D&C N/A 12/22/2016   Procedure: DILATATION AND CURETTAGE /HYSTEROSCOPY;  Surgeon: Dian Queen, MD;  Location: [facility];  Service: Gynecology;  Laterality: N/A;  . POSTERIOR LUMBAR FUSION  02/ 2018    [facility] ([place])  . SKIN CANCER EXCISION    . TONSILLECTOMY  child  . 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 TRANSTHORACIC ECHOCARDIOGRAM  01/21/2009   mild LVH, ef 64-15%, grade 1 diastolic dysfunction  . ULNAR NERVE TRANSPOSITION Left 12-20-2008  dr sypher   decompression and partial resection medial triceps fascia  . WIDE LOCAL EXCISION PILONIDAL AREA  02-02-2002  [name]   recurrent squamous cell carcinoma in situ    SOCIAL HISTORY: Social History   Tobacco Use  . Smoking status: Former Smoker    Packs/day: 0.50    Years: 10.00    Pack years: 5.00    Types: Cigarettes    Last attempt to quit: 12/10/1973    Years since quitting: 43.6  . Smokeless tobacco: Never Used  Substance Use Topics  . Alcohol use: Yes    Comment: occasional  . Drug use: No    FAMILY HISTORY: Family History  Problem Relation Age of Onset  . Esophageal cancer Father   . Heart disease Father        Unknown what kind  . Hyperlipidemia Father   . Hypertension Father   . Cancer Father   . Heart failure  Father   . Hyperlipidemia Mother   . Hypertension Mother   . CAD Sister        48 years younger than patient - had stent. Was told that her anorexia/bulimia may have played a role  . Stroke Maternal Grandmother 66  . Diabetes Maternal Grandmother   . Heart attack Maternal Grandfather 93  . COPD Maternal Grandfather   . Heart attack Paternal Grandmother        86s  . Diabetes Unknown        MGGM    ROS: Review of Systems  Constitutional: Negative for weight loss.  Cardiovascular: Negative for chest pain.  Endo/Heme/Allergies:       Positive for polyphagia Negative for hypoglycemia    PHYSICAL EXAM: Blood pressure 128/75, pulse 82, temperature 98.2 F (36.8 C), temperature source Oral, height '5\' 4"'  (1.626 m), weight 200 lb (90.7 kg), SpO2 98 %. Body mass index is 34.33 kg/m. Physical Exam  Constitutional: She is oriented to person, place, and time. She appears well-developed and well-nourished.  Cardiovascular: Normal rate.  Pulmonary/Chest: Effort normal.  Musculoskeletal: Normal range of motion.  Neurological: She is oriented to person, place, and time.  Skin: Skin is warm and dry.  Psychiatric: She has a normal mood and affect. Her behavior is normal.  Vitals reviewed.   RECENT LABS AND TESTS: BMET    Component Value Date/Time   NA 140 06/09/2017 0848   K 4.6 06/09/2017 0848   CL 100 06/09/2017 0848   CO2 25 06/09/2017 0848   GLUCOSE 100 (H) 06/09/2017 0848   GLUCOSE 147 (H) 12/08/2016 1436   BUN 18 06/09/2017 0848   CREATININE 0.67 06/09/2017 0848   CALCIUM 9.6 06/09/2017 0848   GFRNONAA 87 06/09/2017 0848   GFRAA 100 06/09/2017 0848   Lab Results  Component Value Date   HGBA1C 5.5 06/09/2017   HGBA1C 8.2 (H) 02/23/2017   HGBA1C 5.6 12/08/2016   HGBA1C 5.3 06/04/2016   HGBA1C 5.2 01/08/2015   Lab Results  Component Value Date   INSULIN 28.3 (H) 06/09/2017   INSULIN 30.7 (H) 02/23/2017   CBC    Component Value Date/Time   WBC 4.9 02/23/2017  1007   WBC 6.8 12/15/2016 1159   RBC 3.86 02/23/2017 1007   RBC 4.23 12/15/2016 1159   HGB 11.6 02/23/2017 1007   HCT 33.9 (L) 02/23/2017 1007   PLT 242 12/15/2016 1159   MCV 88 02/23/2017 1007   MCH 30.1 02/23/2017 1007   MCH 30.3 12/15/2016 1159   MCHC 34.2 02/23/2017 1007   MCHC 34.8 12/15/2016 1159   RDW 15.0 02/23/2017 1007   LYMPHSABS 1.4 02/23/2017 1007   MONOABS 0.5 06/04/2016 0808   EOSABS 0.2 02/23/2017 1007   BASOSABS 0.0 02/23/2017 1007   Iron/TIBC/Ferritin/ %Sat    Component Value Date/Time   IRON 109 03/02/2017 1035   TIBC 333 03/19/2015 1448   FERRITIN 218.0 03/02/2017 1035   IRONPCTSAT 15 03/19/2015 1448   Lipid Panel     Component Value Date/Time   CHOL 169 06/09/2017 0848   TRIG 147 06/09/2017 0848   TRIG 101 05/19/2006 0832   HDL 52 06/09/2017 0848   CHOLHDL 4 06/04/2016 0808   VLDL 31.4 06/04/2016 0808   LDLCALC 88 06/09/2017 0848   LDLDIRECT 141.3 09/06/2012 0907   Hepatic Function Panel     Component Value Date/Time   PROT 6.4 06/09/2017 0848   ALBUMIN 4.1 06/09/2017 0848   AST 18 06/09/2017 0848   ALT 23 06/09/2017 0848   ALKPHOS 80 06/09/2017 0848   BILITOT 0.3 06/09/2017 0848   BILIDIR 0.1 12/04/2013 1724      Component Value Date/Time   TSH 4.810 (H) 02/23/2017 1007   TSH 4.22 06/04/2016 0808   TSH 3.23 10/18/2014 1056    ASSESSMENT AND PLAN: Type 2 diabetes mellitus without complication, without long-term current use of insulin ([place]) - Plan: Insulin Pen Needle (BD PEN NEEDLE NANO U/F) 32G X 4 MM MISC, liraglutide (VICTOZA) 18 MG/3ML SOPN, metFORMIN (GLUCOPHAGE) 500 MG tablet, DISCONTINUED: liraglutide (VICTOZA) 18 MG/3ML SOPN  Seasonal  allergic rhinitis due to other allergic trigger - Plan: fluticasone (FLONASE) 50 MCG/ACT nasal spray  At risk for heart disease  Class 1 obesity with serious comorbidity and body mass index (BMI) of 34.0 to 34.9 in adult, unspecified obesity type  PLAN:  Diabetes II Erin Roach has been given  extensive diabetes education by myself today including ideal fasting and post-prandial blood glucose readings, individual ideal Hgb A1c goals and hypoglycemia prevention. We discussed the importance of good blood sugar control to decrease the likelihood of diabetic complications such as nephropathy, neuropathy, limb loss, blindness, coronary artery disease, and death. We discussed the importance of intensive lifestyle modification including diet, exercise and weight loss as the first line treatment for diabetes. Erin Roach agrees to increase victoza to 1.2 mg qd #2 pen and pen needles (patient to increase to 0.9 mg for now). She agrees to continue metformin 500 mg bid # 60 with no refills and will follow up at the agreed upon time.  Cardiovascular risk counseling Erin Roach was given extended (15 minutes) coronary artery disease prevention counseling today. She is 77 y.o. female and has risk factors for heart disease including obesity and diabetes. We discussed intensive lifestyle modifications today with an emphasis on specific weight loss instructions and strategies. Pt was also informed of the importance of increasing exercise and decreasing saturated fats to help prevent heart disease.  Allergic Rhinitis Erin Roach agrees to continue fluticasone. We will refill for 1 month and Erin Roach agrees to follow up with our clinic in 2 weeks.  Obesity Erin Roach is currently in the action stage of change. As such, her goal is to continue with weight loss efforts She has agreed to follow the Category 2 plan Erin Roach has been instructed to work up to a goal of 150 minutes of combined cardio and strengthening exercise per week for weight loss and overall health benefits. We discussed the following Behavioral Modification Strategies today: increasing lean protein intake, decreasing simple carbohydrates , decrease eating out and work on meal planning and easy cooking plans  Erin Roach has agreed to follow up with our clinic in 2 weeks and  with our registered dietician in 1 week. She was informed of the importance of frequent follow up visits to maximize her success with intensive lifestyle modifications for her multiple health conditions.   OBESITY BEHAVIORAL INTERVENTION VISIT  Today's visit was # 9 out of 22.  Starting weight: 223 lbs Starting date: 02/23/17 Today's weight : 200 lbs  Today's date: 07/05/2017 Total lbs lost to date: 23 (Patients must lose 7 lbs in the first 6 months to continue with counseling)   ASK: We discussed the diagnosis of obesity with Erin Roach today and Erin Roach agreed to give [name] permission to discuss obesity behavioral modification therapy today.  ASSESS: Erin Roach has the diagnosis of obesity and her BMI today is 34.31 Erin Roach is in the action stage of change   ADVISE: Erin Roach was educated on the multiple health risks of obesity as well as the benefit of weight loss to improve her health. She was advised of the need for long term treatment and the importance of lifestyle modifications.  AGREE: Multiple dietary modification options and treatment options were discussed and  Erin Roach agreed to the above obesity treatment plan.  I, Erin Roach, am acting as transcriptionist for Dennard Nip, MD  I have reviewed the above documentation for accuracy and completeness, and I agree with the above. -Dennard Nip, MD

## 2017-07-09 LAB — HM DIABETES EYE EXAM

## 2017-07-13 ENCOUNTER — Other Ambulatory Visit: Payer: Self-pay | Admitting: Physical Medicine & Rehabilitation

## 2017-07-14 ENCOUNTER — Encounter: Payer: Self-pay | Admitting: Internal Medicine

## 2017-07-14 MED ORDER — HYDROCODONE-ACETAMINOPHEN 5-325 MG PO TABS: 1.0000 | ORAL_TABLET | Freq: Every day | ORAL | 0 refills | Status: DC

## 2017-07-14 NOTE — Telephone Encounter (Signed)
[place] Controlled Substance Database checked. Last filled on 06/17/17

## 2017-07-15 ENCOUNTER — Other Ambulatory Visit: Payer: Self-pay | Admitting: Physical Medicine & Rehabilitation

## 2017-07-15 ENCOUNTER — Ambulatory Visit (INDEPENDENT_AMBULATORY_CARE_PROVIDER_SITE_OTHER): Payer: Medicare Other | Admitting: Dietician

## 2017-07-15 VITALS — Ht 64.0 in | Wt 202.0 lb

## 2017-07-15 DIAGNOSIS — Z6834 Body mass index (BMI) 34.0-34.9, adult: Secondary | ICD-10-CM

## 2017-07-15 DIAGNOSIS — E669 Obesity, unspecified: Secondary | ICD-10-CM

## 2017-07-16 ENCOUNTER — Other Ambulatory Visit: Payer: Self-pay | Admitting: Physical Medicine & Rehabilitation

## 2017-07-18 ENCOUNTER — Other Ambulatory Visit: Payer: Self-pay | Admitting: Physical Medicine & Rehabilitation

## 2017-07-19 NOTE — Progress Notes (Signed)
  Office: [phone]  /  Fax: [phone]     Erin Roach has a diagnosis of diabetes type II putting her at higher than average risk for cardiovascular disease.  Erin Roach states BGs consistently in an acceptable range and denies any hypoglycemic episodes. Last A1c was Hemoglobin A1C Latest Ref Rng & Units 06/09/2017 02/23/2017 12/08/2016  HGBA1C 4.8 - 5.6 % 5.5 8.2(H) 5.6  Some recent data might be hidden    She has been working on intensive lifestyle modifications including diet, exercise, and weight loss to help control her blood glucose levels. Erin Roach is here today for cardiovascular risk nutrition counseling which includes diabetic education and her obesity treatment plan. Her weight today is 202 lbs, a 2 lb weight gain since her last visit. She has had a weight loss of 21 lbs since beginning treatment with [name]. She states she is done traveling for a while and is ready to get back on track. She states her hunger is better controlled, but is getting bored with the category meal plan. She was educated on journaling her food intake using the nutrition application My[employer]. Her goals 1300-1500 calories and 90+ g protein per day.  Reviewed  food nutrients ie protein, fats, simple and complex carbohydrates and how these affect insulin response. Focus on portion control,  avoiding simple carbohydrates and increasing lean protein for ongoing wt loss efforts and glucose management.  Erin Roach is on the following meal plan: journaling 1300-1500 calories and 90+ g  Protein.  Her  meal plan was individualized for maximum benefit.  Also discussed at length the following behavioral modifications to help maximize success: increasing lean protein intake, decreasing simple carbohydrates, increasing vegetables, increase water intake,  decreasing eating out,  travel eating strategies, keeping a strict food journal.    Erin Roach has been instructed to work up to a goal of 150 minutes of combined cardio and strengthening  exercise per week for weight loss and overall health benefits. Written information was provided and the following handouts were given:  journaling instructions.    Office: [phone]  /  Fax: [phone]  OBESITY BEHAVIORAL INTERVENTION VISIT  Today's visit was # 10 out of 22.  Starting weight: 223 Starting date: 02/23/18 Today's weight : Weight: 202 lb (91.6 kg)  Today's date:07/15/17 Total lbs lost to date: 21 (Patients must lose 7 lbs in the first 6 months to continue with counseling)   ASK: We discussed the diagnosis of obesity with Erin Roach today and Erin Roach agreed to give [name] permission to discuss obesity behavioral modification therapy today.  ASSESS: Erin Roach has the diagnosis of obesity and her BMI today is 34.66 Erin Roach is in the action stage of change   ADVISE: Erin Roach was educated on the multiple health risks of obesity as well as the benefit of weight loss to improve her health. She was advised of the need for long term treatment and the importance of lifestyle modifications.  AGREE: Multiple dietary modification options and treatment options were discussed and  Erin Roach agreed to keep a food journal with 1300-1500 calories and 90+ g protein  We discussed the following Behavioral Modification Stratagies today: increasing lean protein intake, decreasing simple carbohydrates , increasing vegetables and decrease eating out

## 2017-07-22 ENCOUNTER — Ambulatory Visit (INDEPENDENT_AMBULATORY_CARE_PROVIDER_SITE_OTHER): Payer: Medicare Other | Admitting: Family Medicine

## 2017-07-22 VITALS — BP 134/73 | HR 93 | Temp 98.0°F | Ht 64.0 in | Wt 196.0 lb

## 2017-07-22 DIAGNOSIS — E669 Obesity, unspecified: Secondary | ICD-10-CM

## 2017-07-22 DIAGNOSIS — Z6833 Body mass index (BMI) 33.0-33.9, adult: Secondary | ICD-10-CM | POA: Diagnosis not present

## 2017-07-22 DIAGNOSIS — G4709 Other insomnia: Secondary | ICD-10-CM | POA: Diagnosis not present

## 2017-07-22 DIAGNOSIS — E119 Type 2 diabetes mellitus without complications: Secondary | ICD-10-CM

## 2017-07-22 MED ORDER — ZOLPIDEM TARTRATE 5 MG PO TABS: 5.0000 mg | ORAL_TABLET | Freq: Every evening | ORAL | 0 refills | Status: DC | PRN

## 2017-07-26 NOTE — Progress Notes (Signed)
Office: [phone]  /  Fax: [phone]   HPI:   Chief Complaint: OBESITY Erin Roach is here to discuss her progress with her obesity treatment plan. She is on the Category 2 plan and is following her eating plan approximately 90 % of the time. She states she is cycling and walking 17 minutes 5 times per week. Erin Roach continues to do very well with weight loss. She is getting better at journaling with her iPhone app and is working on increasing lean protein. Her weight is 196 lb (88.9 kg) today and has had a weight loss of 6 pounds over a period of 2 weeks since her last visit. She has lost 27 lbs since starting treatment with [name].  Diabetes II Erin Roach has a diagnosis of diabetes type II. Erin Roach states fasting BGs range between 109 and 137, 2 hour post prandial range between 119 and 206, mostly 120 to 140's.She is on Victoza and denies any hypoglycemic episodes. Last A1c was at 5.5 She has been working on intensive lifestyle modifications including diet, exercise, and weight loss to help control her blood glucose levels.  Insomnia Erin Roach notes some improvement in sleep on Ambien. She's had no recent falls or motor vehicle accident. Erin Roach uses Ambien 1 to 2 times per week.  ALLERGIES: No Known Allergies  MEDICATIONS: Current Outpatient Medications on File Prior to Visit  Medication Sig Dispense Refill  . Blood Glucose Monitoring Suppl (ONE TOUCH ULTRA 2) w/Device KIT USE TO TEST BLOOD SUGARS UP TO 4 TIMES DAILY 1 each 0  . Cholecalciferol (VITAMIN D) 2000 units tablet Take 1 tablet (2,000 Units total) by mouth daily.    . fluticasone (FLONASE) 50 MCG/ACT nasal spray Place 1 spray into both nostrils daily. 16 g 0  . furosemide (LASIX) 20 MG tablet TAKE ONE TABLET BY MOUTH DAILY (Patient taking differently: TAKE TWO TABLET BY MOUTH DAILY-- takes in am) 90 tablet 3  . glucose blood (ONE TOUCH ULTRA TEST) test strip USE AS INSTRUCTED TWICE DAILY 100 each 0  . HYDROcodone-acetaminophen  (NORCO/VICODIN) 5-325 MG tablet Take 1 tablet by mouth at bedtime. 30 tablet 0  . Insulin Pen Needle (BD PEN NEEDLE NANO U/F) 32G X 4 MM MISC 1 Package by Does not apply route daily at 12 noon. 100 each 0  . levocetirizine (XYZAL) 5 MG tablet TAKE 1 TABLET BY MOUTH EVERY EVENING. 63 tablet 2  . liraglutide (VICTOZA) 18 MG/3ML SOPN Inject 0.2 mLs (1.2 mg total) into the skin every morning. 2 pen 0  . losartan (COZAAR) 100 MG tablet TAKE ONE TABLET BY MOUTH DAILY 90 tablet 1  . metFORMIN (GLUCOPHAGE) 500 MG tablet Take 1 tablet (500 mg total) by mouth 2 (two) times daily with a meal. 60 tablet 0  . ONETOUCH DELICA LANCETS 96E MISC USE AS DIRECTED UP TO 4 TIMES A DAY 100 each 5  . pramipexole (MIRAPEX) 0.25 MG tablet Take 0.5 mg by mouth 2 (two) times daily. 1630  And  Bedtime    . pregabalin (LYRICA) 75 MG capsule TAKE 1 CAPSULE BY MOUTH IN AFTERNOON, AND 2 CAPSULES AT BEDTIME 270 capsule 1  . simvastatin (ZOCOR) 40 MG tablet TAKE ONE TABLET BY MOUTH AT BEDTIME 90 tablet 1  . [DISCONTINUED] VESICARE 10 MG tablet TAKE 1 TABLET EVERY DAY 30 tablet 0   No current facility-administered medications on file prior to visit.     PAST MEDICAL HISTORY: Past Medical History:  Diagnosis Date  . Allergic rhinitis   . Apnea   .  Arthritis   . Asthma    very mild  . Back pain   . Bilateral lower extremity edema   . Carotid artery disease ([place])    a. Carotid duplex 03/2016 - duplex was stable, 19-14% RICA, 7-82% LICA  . Complication of anesthesia    post-op delirium  . Endometrial polyp   . Essential hypertension   . GERD (gastroesophageal reflux disease)   . Heart murmur    per pt  . History of adenomatous polyp of colon    04-24-2016  tubular adenoma  . History of squamous cell carcinoma excision    pilonidal area second excision 02-02-2002  . HTN (hypertension)   . Hyperlipidemia   . Leg pain   . OAB (overactive bladder)   . RA (rheumatoid arthritis) ([place])   . RLS (restless legs syndrome)    . Shortness of breath on exertion   . SUI (stress urinary incontinence, female)   . Venous insufficiency    legs  . Vitamin D deficiency     PAST SURGICAL HISTORY: Past Surgical History:  Procedure Laterality Date  . APPENDECTOMY  07-02-2005   [name]   open  . CARDIAC CATHETERIZATION  01-23-2009  dr Darnell Level brodie   normal coronary arteries and lvf  . CARDIOVASCULAR STRESS TEST  03/31/2016   Low risk nuclear study w/ medium defect of mild severity in basal anterior and mid anterior location w/ no evidence ischemia or infarction/  normal LV function and wall motion,  nuclear stress ef 71%  . CATARACT EXTRACTION W/ INTRAOCULAR LENS IMPLANT Left 09/2016  . COLONOSCOPY  last one 04-24-2016  . HYSTEROSCOPY W/D&C N/A 12/22/2016   Procedure: DILATATION AND CURETTAGE /HYSTEROSCOPY;  Surgeon: Dian Queen, MD;  Location: [facility];  Service: Gynecology;  Laterality: N/A;  . POSTERIOR LUMBAR FUSION  02/ 2018    [facility] ([place])  . SKIN CANCER EXCISION    . TONSILLECTOMY  child  . 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 TRANSTHORACIC ECHOCARDIOGRAM  01/21/2009   mild LVH, ef 95-62%, grade 1 diastolic dysfunction  . ULNAR NERVE TRANSPOSITION Left 12-20-2008  dr sypher   decompression and partial resection medial triceps fascia  . WIDE LOCAL EXCISION PILONIDAL AREA  02-02-2002  [name]   recurrent squamous cell carcinoma in situ    SOCIAL HISTORY: Social History   Tobacco Use  . Smoking status: Former Smoker    Packs/day: 0.50    Years: 10.00    Pack years: 5.00    Types: Cigarettes    Last attempt to quit: 12/10/1973    Years since quitting: 43.6  . Smokeless tobacco: Never Used  Substance Use Topics  . Alcohol use: Yes    Comment: occasional  . Drug use: No    FAMILY HISTORY: Family History  Problem  Relation Age of Onset  . Esophageal cancer Father   . Heart disease Father        Unknown what kind  . Hyperlipidemia Father   . Hypertension Father   . Cancer Father   . Heart failure Father   . Hyperlipidemia Mother   . Hypertension Mother   . CAD Sister        32 years younger than patient - had stent. Was told that her anorexia/bulimia may have played a role  . Stroke Maternal Grandmother 66  .  Diabetes Maternal Grandmother   . Heart attack Maternal Grandfather 93  . COPD Maternal Grandfather   . Heart attack Paternal Grandmother        28s  . Diabetes Unknown        MGGM    ROS: Review of Systems  Constitutional: Positive for weight loss.  Endo/Heme/Allergies:       Negative for hypoglycemia  Psychiatric/Behavioral: The patient has insomnia.     PHYSICAL EXAM: Blood pressure 134/73, pulse 93, temperature 98 F (36.7 C), temperature source Oral, height '5\' 4"'  (1.626 m), weight 196 lb (88.9 kg), SpO2 98 %. Body mass index is 33.64 kg/m. Physical Exam  Constitutional: She is oriented to person, place, and time. She appears well-developed and well-nourished.  Cardiovascular: Normal rate.  Pulmonary/Chest: Effort normal.  Musculoskeletal: Normal range of motion.  Neurological: She is oriented to person, place, and time.  Skin: Skin is warm and dry.  Psychiatric: She has a normal mood and affect. Her behavior is normal.  Vitals reviewed.   RECENT LABS AND TESTS: BMET    Component Value Date/Time   NA 140 06/09/2017 0848   K 4.6 06/09/2017 0848   CL 100 06/09/2017 0848   CO2 25 06/09/2017 0848   GLUCOSE 100 (H) 06/09/2017 0848   GLUCOSE 147 (H) 12/08/2016 1436   BUN 18 06/09/2017 0848   CREATININE 0.67 06/09/2017 0848   CALCIUM 9.6 06/09/2017 0848   GFRNONAA 87 06/09/2017 0848   GFRAA 100 06/09/2017 0848   Lab Results  Component Value Date   HGBA1C 5.5 06/09/2017   HGBA1C 8.2 (H) 02/23/2017   HGBA1C 5.6 12/08/2016   HGBA1C 5.3 06/04/2016   HGBA1C 5.2  01/08/2015   Lab Results  Component Value Date   INSULIN 28.3 (H) 06/09/2017   INSULIN 30.7 (H) 02/23/2017   CBC    Component Value Date/Time   WBC 4.9 02/23/2017 1007   WBC 6.8 12/15/2016 1159   RBC 3.86 02/23/2017 1007   RBC 4.23 12/15/2016 1159   HGB 11.6 02/23/2017 1007   HCT 33.9 (L) 02/23/2017 1007   PLT 242 12/15/2016 1159   MCV 88 02/23/2017 1007   MCH 30.1 02/23/2017 1007   MCH 30.3 12/15/2016 1159   MCHC 34.2 02/23/2017 1007   MCHC 34.8 12/15/2016 1159   RDW 15.0 02/23/2017 1007   LYMPHSABS 1.4 02/23/2017 1007   MONOABS 0.5 06/04/2016 0808   EOSABS 0.2 02/23/2017 1007   BASOSABS 0.0 02/23/2017 1007   Iron/TIBC/Ferritin/ %Sat    Component Value Date/Time   IRON 109 03/02/2017 1035   TIBC 333 03/19/2015 1448   FERRITIN 218.0 03/02/2017 1035   IRONPCTSAT 15 03/19/2015 1448   Lipid Panel     Component Value Date/Time   CHOL 169 06/09/2017 0848   TRIG 147 06/09/2017 0848   TRIG 101 05/19/2006 0832   HDL 52 06/09/2017 0848   CHOLHDL 4 06/04/2016 0808   VLDL 31.4 06/04/2016 0808   LDLCALC 88 06/09/2017 0848   LDLDIRECT 141.3 09/06/2012 0907   Hepatic Function Panel     Component Value Date/Time   PROT 6.4 06/09/2017 0848   ALBUMIN 4.1 06/09/2017 0848   AST 18 06/09/2017 0848   ALT 23 06/09/2017 0848   ALKPHOS 80 06/09/2017 0848   BILITOT 0.3 06/09/2017 0848   BILIDIR 0.1 12/04/2013 1724      Component Value Date/Time   TSH 4.810 (H) 02/23/2017 1007   TSH 4.22 06/04/2016 0808   TSH 3.23 10/18/2014 1056  ASSESSMENT AND PLAN: Type 2 diabetes mellitus without complication, without long-term current use of insulin (HCC)  Other insomnia - Plan: zolpidem (AMBIEN) 5 MG tablet  Class 1 obesity with serious comorbidity and body mass index (BMI) of 33.0 to 33.9 in adult, unspecified obesity type  PLAN:  Diabetes II Erin Roach has been given extensive diabetes education by myself today including ideal fasting and post-prandial blood glucose readings,  individual ideal Hgb A1c goals and hypoglycemia prevention. We discussed the importance of good blood sugar control to decrease the likelihood of diabetic complications such as nephropathy, neuropathy, limb loss, blindness, coronary artery disease, and death. We discussed the importance of intensive lifestyle modification including diet, exercise and weight loss as the first line treatment for diabetes. Erin Roach agrees to continue with diet, exercise and weight loss. Erin Roach agrees to continue Victoza and follow up at the agreed upon time.  Insomnia Erin Roach agrees to continue Ambien 5 mg 1/2 to 1 table po qhs as needed #30 (thirty) with no refills and follow up as directed.  Obesity Erin Roach is currently in the action stage of change. As such, her goal is to continue with weight loss efforts She has agreed to keep a food journal with 1300 to 1500 calories and 90+ grams of protein daily Erin Roach has been instructed to work up to a goal of 150 minutes of combined cardio and strengthening exercise per week for weight loss and overall health benefits. We discussed the following Behavioral Modification Strategies today: increasing lean protein intake, decreasing simple carbohydrates  and work on meal planning and easy cooking plans  Erin Roach has agreed to follow up with our clinic in 3 weeks. She was informed of the importance of frequent follow up visits to maximize her success with intensive lifestyle modifications for her multiple health conditions.   OBESITY BEHAVIORAL INTERVENTION VISIT  Today's visit was # 10 out of 22.  Starting weight: 223 lbs Starting date: 02/23/17 Today's weight : 196 lbs Today's date: 07/22/2017 Total lbs lost to date: 25 (Patients must lose 7 lbs in the first 6 months to continue with counseling)   ASK: We discussed the diagnosis of obesity with Erin Roach today and Erin Roach agreed to give [name] permission to discuss obesity behavioral modification therapy today.  ASSESS: Erin Roach  has the diagnosis of obesity and her BMI today is 33.63 Erin Roach is in the action stage of change   ADVISE: Erin Roach was educated on the multiple health risks of obesity as well as the benefit of weight loss to improve her health. She was advised of the need for long term treatment and the importance of lifestyle modifications.  AGREE: Multiple dietary modification options and treatment options were discussed and  Erin Roach agreed to the above obesity treatment plan.  I, Doreene Nest, am acting as transcriptionist for Dennard Nip, MD  I have reviewed the above documentation for accuracy and completeness, and I agree with the above. -Dennard Nip, MD

## 2017-07-28 ENCOUNTER — Other Ambulatory Visit (INDEPENDENT_AMBULATORY_CARE_PROVIDER_SITE_OTHER): Payer: Self-pay | Admitting: Family Medicine

## 2017-07-28 DIAGNOSIS — J3089 Other allergic rhinitis: Secondary | ICD-10-CM

## 2017-08-02 ENCOUNTER — Other Ambulatory Visit (INDEPENDENT_AMBULATORY_CARE_PROVIDER_SITE_OTHER): Payer: Self-pay | Admitting: Family Medicine

## 2017-08-02 DIAGNOSIS — E119 Type 2 diabetes mellitus without complications: Secondary | ICD-10-CM

## 2017-08-03 ENCOUNTER — Other Ambulatory Visit: Payer: Self-pay | Admitting: Internal Medicine

## 2017-08-09 ENCOUNTER — Encounter (INDEPENDENT_AMBULATORY_CARE_PROVIDER_SITE_OTHER): Payer: Self-pay | Admitting: Family Medicine

## 2017-08-10 ENCOUNTER — Encounter: Payer: Self-pay | Admitting: Internal Medicine

## 2017-08-10 NOTE — Telephone Encounter (Signed)
Check National registry last filled 07/15/2017.Marland KitchenJohny Chess

## 2017-08-11 MED ORDER — HYDROCODONE-ACETAMINOPHEN 5-325 MG PO TABS: 1.0000 | ORAL_TABLET | Freq: Every day | ORAL | 0 refills | Status: DC

## 2017-08-12 ENCOUNTER — Ambulatory Visit (INDEPENDENT_AMBULATORY_CARE_PROVIDER_SITE_OTHER): Payer: Medicare Other | Admitting: Family Medicine

## 2017-08-12 VITALS — BP 124/72 | HR 95 | Temp 98.4°F | Ht 65.0 in | Wt 197.0 lb

## 2017-08-12 DIAGNOSIS — E669 Obesity, unspecified: Secondary | ICD-10-CM | POA: Diagnosis not present

## 2017-08-12 DIAGNOSIS — Z6832 Body mass index (BMI) 32.0-32.9, adult: Secondary | ICD-10-CM | POA: Diagnosis not present

## 2017-08-12 DIAGNOSIS — E119 Type 2 diabetes mellitus without complications: Secondary | ICD-10-CM | POA: Diagnosis not present

## 2017-08-12 MED ORDER — ONETOUCH DELICA LANCETS 33G MISC: 3 refills | Status: DC

## 2017-08-12 MED ORDER — GLUCOSE BLOOD VI STRP: ORAL_STRIP | 3 refills | Status: DC

## 2017-08-12 NOTE — Progress Notes (Signed)
Office: [phone]  /  Fax: [phone]   HPI:   Chief Complaint: OBESITY Erin Roach is here to discuss her progress with her obesity treatment plan. She is on the keep a food journal with 1300 to 1500 calories and 90 grams of protein daily and is following her eating plan approximately 80 % of the time. She states she is doing a lot of gardening and walking for exercise. Erin Roach has been working on [employer], [employer] and has started using a home meal delivery kit service and would like to know if this is okay. Her weight is 197 lb (89.4 kg) today and has maintained weight over a period of 3 weeks since her last visit. She has lost 26 lbs since starting treatment with [name].  Diabetes II Erin Roach has a diagnosis of diabetes type II and is on victoza 0.9 mg and states fasting BGs range approximately between 110 and 130, 2 hour post prandial BGs are ranging even lower at 96 to 150, mostly 120's. She denies any hypoglycemic episodes. She has been working on intensive lifestyle modifications including diet, exercise, and weight loss to help control her blood glucose levels.  ALLERGIES: No Known Allergies  MEDICATIONS: Current Outpatient Medications on File Prior to Visit  Medication Sig Dispense Refill  . Blood Glucose Monitoring Suppl (ONE TOUCH ULTRA 2) w/Device KIT USE TO TEST BLOOD SUGARS UP TO 4 TIMES DAILY 1 each 0  . Cholecalciferol (VITAMIN D) 2000 units tablet Take 1 tablet (2,000 Units total) by mouth daily.    . fluticasone (FLONASE) 50 MCG/ACT nasal spray Place 1 spray into both nostrils daily. 16 g 0  . furosemide (LASIX) 20 MG tablet TAKE ONE TABLET BY MOUTH DAILY (Patient taking differently: TAKE TWO TABLET BY MOUTH DAILY-- takes in am) 90 tablet 3  . HYDROcodone-acetaminophen (NORCO/VICODIN) 5-325 MG tablet Take 1 tablet by mouth at bedtime. 30 tablet 0  . Insulin Pen Needle (BD PEN NEEDLE NANO U/F) 32G X 4 MM MISC 1 Package by Does not apply route daily at 12 noon. 100 each 0  .  levocetirizine (XYZAL) 5 MG tablet TAKE 1 TABLET BY MOUTH EVERY EVENING. 63 tablet 2  . losartan (COZAAR) 100 MG tablet TAKE ONE TABLET BY MOUTH DAILY 90 tablet 3  . metFORMIN (GLUCOPHAGE) 500 MG tablet TAKE 1 TABLET (500 MG TOTAL) BY MOUTH 2 (TWO) TIMES DAILY WITH A MEAL. 60 tablet 0  . pramipexole (MIRAPEX) 0.25 MG tablet Take 0.5 mg by mouth 2 (two) times daily. 1630  And  Bedtime    . pregabalin (LYRICA) 75 MG capsule TAKE 1 CAPSULE BY MOUTH IN AFTERNOON, AND 2 CAPSULES AT BEDTIME 270 capsule 1  . simvastatin (ZOCOR) 40 MG tablet TAKE ONE TABLET BY MOUTH AT BEDTIME 90 tablet 3  . VICTOZA 18 MG/3ML SOPN INJECT 1.2 MG TOTAL INTO THE SKIN EVERY MORNING. 3 pen 0  . zolpidem (AMBIEN) 5 MG tablet Take 1 tablet (5 mg total) by mouth at bedtime as needed for sleep. Half tab at bedtime 30 tablet 0  . [DISCONTINUED] VESICARE 10 MG tablet TAKE 1 TABLET EVERY DAY 30 tablet 0   No current facility-administered medications on file prior to visit.     PAST MEDICAL HISTORY: Past Medical History:  Diagnosis Date  . Allergic rhinitis   . Apnea   . Arthritis   . Asthma    very mild  . Back pain   . Bilateral lower extremity edema   . Carotid artery disease ([place])  a. Carotid duplex 03/2016 - duplex was stable, 67-67% RICA, 2-09% LICA  . Complication of anesthesia    post-op delirium  . Endometrial polyp   . Essential hypertension   . GERD (gastroesophageal reflux disease)   . Heart murmur    per pt  . History of adenomatous polyp of colon    04-24-2016  tubular adenoma  . History of squamous cell carcinoma excision    pilonidal area second excision 02-02-2002  . HTN (hypertension)   . Hyperlipidemia   . Leg pain   . OAB (overactive bladder)   . RA (rheumatoid arthritis) ([place])   . RLS (restless legs syndrome)   . Shortness of breath on exertion   . SUI (stress urinary incontinence, female)   . Venous insufficiency    legs  . Vitamin D deficiency     PAST SURGICAL HISTORY: Past  Surgical History:  Procedure Laterality Date  . APPENDECTOMY  07-02-2005   [name]   open  . CARDIAC CATHETERIZATION  01-23-2009  dr Darnell Level brodie   normal coronary arteries and lvf  . CARDIOVASCULAR STRESS TEST  03/31/2016   Low risk nuclear study w/ medium defect of mild severity in basal anterior and mid anterior location w/ no evidence ischemia or infarction/  normal LV function and wall motion,  nuclear stress ef 71%  . CATARACT EXTRACTION W/ INTRAOCULAR LENS IMPLANT Left 09/2016  . COLONOSCOPY  last one 04-24-2016  . HYSTEROSCOPY W/D&C N/A 12/22/2016   Procedure: DILATATION AND CURETTAGE /HYSTEROSCOPY;  Surgeon: Dian Queen, MD;  [facility];  Service: Gynecology;  Laterality: N/A;  . POSTERIOR LUMBAR FUSION  02/ 2018    [facility] ([place] )  . SKIN CANCER EXCISION    . TONSILLECTOMY  child  . 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 TRANSTHORACIC ECHOCARDIOGRAM  01/21/2009   mild LVH, ef 47-09%, grade 1 diastolic dysfunction  . ULNAR NERVE TRANSPOSITION Left 12-20-2008  dr sypher   decompression and partial resection medial triceps fascia  . WIDE LOCAL EXCISION PILONIDAL AREA  02-02-2002  [name]   recurrent squamous cell carcinoma in situ    SOCIAL HISTORY: Social History   Tobacco Use  . Smoking status: Former Smoker    Packs/day: 0.50    Years: 10.00    Pack years: 5.00    Types: Cigarettes    Last attempt to quit: 12/10/1973    Years since quitting: 43.7  . Smokeless tobacco: Never Used  Substance Use Topics  . Alcohol use: Yes    Comment: occasional  . Drug use: No    FAMILY HISTORY: Family History  Problem Relation Age of Onset  . Esophageal cancer Father   . Heart disease Father        Unknown what kind  . Hyperlipidemia Father   . Hypertension Father   . Cancer Father   .  Heart failure Father   . Hyperlipidemia Mother   . Hypertension Mother   . CAD Sister        31 years younger than patient - had stent. Was told that her anorexia/bulimia may have played a role  . Stroke Maternal Grandmother 66  . Diabetes Maternal Grandmother   . Heart attack Maternal Grandfather 93  . COPD Maternal Grandfather   . Heart attack Paternal Grandmother        6s  .  Diabetes Unknown        MGGM    ROS: Review of Systems  Constitutional: Negative for weight loss.  Endo/Heme/Allergies:       Negative for hypoglycemia    PHYSICAL EXAM: Blood pressure 124/72, pulse 95, temperature 98.4 F (36.9 C), temperature source Oral, height '5\' 5"'  (1.651 m), weight 197 lb (89.4 kg), SpO2 98 %. Body mass index is 32.78 kg/m. Physical Exam  Constitutional: She is oriented to person, place, and time. She appears well-developed and well-nourished.  Cardiovascular: Normal rate.  Pulmonary/Chest: Effort normal.  Musculoskeletal: Normal range of motion.  Neurological: She is oriented to person, place, and time.  Skin: Skin is warm and dry.  Psychiatric: She has a normal mood and affect. Her behavior is normal.  Vitals reviewed.   RECENT LABS AND TESTS: BMET    Component Value Date/Time   NA 140 06/09/2017 0848   K 4.6 06/09/2017 0848   CL 100 06/09/2017 0848   CO2 25 06/09/2017 0848   GLUCOSE 100 (H) 06/09/2017 0848   GLUCOSE 147 (H) 12/08/2016 1436   BUN 18 06/09/2017 0848   CREATININE 0.67 06/09/2017 0848   CALCIUM 9.6 06/09/2017 0848   GFRNONAA 87 06/09/2017 0848   GFRAA 100 06/09/2017 0848   Lab Results  Component Value Date   HGBA1C 5.5 06/09/2017   HGBA1C 8.2 (H) 02/23/2017   HGBA1C 5.6 12/08/2016   HGBA1C 5.3 06/04/2016   HGBA1C 5.2 01/08/2015   Lab Results  Component Value Date   INSULIN 28.3 (H) 06/09/2017   INSULIN 30.7 (H) 02/23/2017   CBC    Component Value Date/Time   WBC 4.9 02/23/2017 1007   WBC 6.8 12/15/2016 1159   RBC 3.86 02/23/2017  1007   RBC 4.23 12/15/2016 1159   HGB 11.6 02/23/2017 1007   HCT 33.9 (L) 02/23/2017 1007   PLT 242 12/15/2016 1159   MCV 88 02/23/2017 1007   MCH 30.1 02/23/2017 1007   MCH 30.3 12/15/2016 1159   MCHC 34.2 02/23/2017 1007   MCHC 34.8 12/15/2016 1159   RDW 15.0 02/23/2017 1007   LYMPHSABS 1.4 02/23/2017 1007   MONOABS 0.5 06/04/2016 0808   EOSABS 0.2 02/23/2017 1007   BASOSABS 0.0 02/23/2017 1007   Iron/TIBC/Ferritin/ %Sat    Component Value Date/Time   IRON 109 03/02/2017 1035   TIBC 333 03/19/2015 1448   FERRITIN 218.0 03/02/2017 1035   IRONPCTSAT 15 03/19/2015 1448   Lipid Panel     Component Value Date/Time   CHOL 169 06/09/2017 0848   TRIG 147 06/09/2017 0848   TRIG 101 05/19/2006 0832   HDL 52 06/09/2017 0848   CHOLHDL 4 06/04/2016 0808   VLDL 31.4 06/04/2016 0808   LDLCALC 88 06/09/2017 0848   LDLDIRECT 141.3 09/06/2012 0907   Hepatic Function Panel     Component Value Date/Time   PROT 6.4 06/09/2017 0848   ALBUMIN 4.1 06/09/2017 0848   AST 18 06/09/2017 0848   ALT 23 06/09/2017 0848   ALKPHOS 80 06/09/2017 0848   BILITOT 0.3 06/09/2017 0848   BILIDIR 0.1 12/04/2013 1724      Component Value Date/Time   TSH 4.810 (H) 02/23/2017 1007   TSH 4.22 06/04/2016 0808   TSH 3.23 10/18/2014 1056   Results for ARIELLE, EBER (MRN [id number]) as of 08/12/2017 16:18  Ref. Range 06/09/2017 08:48  Vitamin D, 25-Hydroxy Latest Ref Range: 30.0 - 100.0 ng/mL 38.1   ASSESSMENT AND PLAN: Type 2 diabetes mellitus without complication, without long-term  current use of insulin ([place]) - Plan: ONETOUCH DELICA LANCETS 59X MISC, glucose blood (ONE TOUCH ULTRA TEST) test strip  Class 1 obesity with serious comorbidity and body mass index (BMI) of 32.0 to 32.9 in adult, unspecified obesity type  PLAN:  Diabetes II Erin Roach has been given extensive diabetes education by myself today including ideal fasting and post-prandial blood glucose readings, individual ideal Hgb A1c goals  and hypoglycemia prevention. We discussed the importance of good blood sugar control to decrease the likelihood of diabetic complications such as nephropathy, neuropathy, limb loss, blindness, coronary artery disease, and death. We discussed the importance of intensive lifestyle modification including diet, exercise and weight loss as the first line treatment for diabetes. Erin Roach agrees to increase victoza to 1.2 mg (no refill needed) and continue metformin. Prescription was written today for 3 month refill of  lancets and strips. Erin Roach agrees to continue with diet and follow up at the agreed upon time.  Obesity Erin Roach is currently in the action stage of change. As such, her goal is to continue with weight loss efforts She has agreed to keep a food journal with 1300 to 1500 calories and 90 grams of protein daily Erin Roach has been instructed to work up to a goal of 150 minutes of combined cardio and strengthening exercise per week for weight loss and overall health benefits. We discussed the following Behavioral Modification Strategies today: increasing lean protein intake, decreasing simple carbohydrates , work on meal planning and easy cooking plans and emotional eating strategies  Erin Roach was advised that it is okay to use a meal kit delivery service, but she was shown how many of the meals were over 700 calories, so she will have to budget that into her day. Patient agreed.  Erin Roach has agreed to follow up with our clinic in 2 weeks. She was informed of the importance of frequent follow up visits to maximize her success with intensive lifestyle modifications for her multiple health conditions.   OBESITY BEHAVIORAL INTERVENTION VISIT  Today's visit was # 11 out of 22.  Starting weight: 223 lbs Starting date: 02/23/17 Today's weight : 197 lbs Today's date: 08/12/2017 Total lbs lost to date: 31 (Patients must lose 7 lbs in the first 6 months to continue with counseling)   ASK: We discussed the  diagnosis of obesity with Erin Roach today and Erin Roach agreed to give [name] permission to discuss obesity behavioral modification therapy today.  ASSESS: Erin Roach has the diagnosis of obesity and her BMI today is 32.78 Erin Roach is in the action stage of change   ADVISE: Erin Roach was educated on the multiple health risks of obesity as well as the benefit of weight loss to improve her health. She was advised of the need for long term treatment and the importance of lifestyle modifications.  AGREE: Multiple dietary modification options and treatment options were discussed and  Erin Roach agreed to the above obesity treatment plan.  I, Doreene Nest, am acting as transcriptionist for Dennard Nip, MD  I have reviewed the above documentation for accuracy and completeness, and I agree with the above. -Dennard Nip, MD

## 2017-08-18 ENCOUNTER — Ambulatory Visit: Payer: Medicare Other | Admitting: Internal Medicine

## 2017-08-25 ENCOUNTER — Ambulatory Visit (INDEPENDENT_AMBULATORY_CARE_PROVIDER_SITE_OTHER): Payer: Medicare Other | Admitting: Family Medicine

## 2017-08-25 VITALS — BP 124/77 | HR 111 | Temp 99.2°F | Ht 65.0 in | Wt 191.0 lb

## 2017-08-25 DIAGNOSIS — Z6831 Body mass index (BMI) 31.0-31.9, adult: Secondary | ICD-10-CM | POA: Diagnosis not present

## 2017-08-25 DIAGNOSIS — R5383 Other fatigue: Secondary | ICD-10-CM | POA: Diagnosis not present

## 2017-08-25 DIAGNOSIS — E860 Dehydration: Secondary | ICD-10-CM | POA: Diagnosis not present

## 2017-08-25 DIAGNOSIS — E119 Type 2 diabetes mellitus without complications: Secondary | ICD-10-CM

## 2017-08-25 DIAGNOSIS — E669 Obesity, unspecified: Secondary | ICD-10-CM | POA: Diagnosis not present

## 2017-08-26 ENCOUNTER — Encounter (INDEPENDENT_AMBULATORY_CARE_PROVIDER_SITE_OTHER): Payer: Self-pay | Admitting: Family Medicine

## 2017-08-26 NOTE — Progress Notes (Signed)
Office: [phone]  /  Fax: [phone]   HPI:   Chief Complaint: OBESITY Erin Roach is here to discuss her progress with her obesity treatment plan. She is on the keep a food journal with 1300-1500 calories and 90 grams of protein daily and is following her eating plan approximately 80-85 % of the time. She states she is walking for 15 minutes 2-3 times per week. Erin Roach continues to do well with weight loss and journaling. She is not feeling well today, more fatigued but has been feeling well before this.  Her weight is 191 lb (86.6 kg) today and has had a weight loss of 6 pounds over a period of 2 weeks since her last visit. She has lost 32 lbs since starting treatment with [name].  Diabetes II Erin Roach has a diagnosis of diabetes type II. Erin Roach forgot to bring in her BGs log, states home glucose ranging between 120 and 150's. She denies any hypoglycemic episodes. Last A1c was 5.5 on 06/09/17. She has been working on intensive lifestyle modifications including diet, exercise, and weight loss to help control her blood glucose levels.  Dehydration Erin Roach has sims tachycardia and states she is not drinking much H20. She also feels "run down".  Fatigue Erin Roach has fatigue with tachycardia, she notes feeling more "run down" today. She does not have a history of atrial fibrillation or any arrhythmia. She is not drinking much H20 and may be dehydrated. Temperature is 99.2 and she denies symptoms of infection (no cough or dysuria).  ALLERGIES: No Known Allergies  MEDICATIONS: Current Outpatient Medications on File Prior to Visit  Medication Sig Dispense Refill  . Blood Glucose Monitoring Suppl (ONE TOUCH ULTRA 2) w/Device KIT USE TO TEST BLOOD SUGARS UP TO 4 TIMES DAILY 1 each 0  . Cholecalciferol (VITAMIN D) 2000 units tablet Take 1 tablet (2,000 Units total) by mouth daily.    . fluticasone (FLONASE) 50 MCG/ACT nasal spray Place 1 spray into both nostrils daily. 16 g 0  . furosemide (LASIX) 20 MG  tablet TAKE ONE TABLET BY MOUTH DAILY (Patient taking differently: TAKE TWO TABLET BY MOUTH DAILY-- takes in am) 90 tablet 3  . glucose blood (ONE TOUCH ULTRA TEST) test strip USE AS INSTRUCTED TWICE DAILY 100 each 3  . HYDROcodone-acetaminophen (NORCO/VICODIN) 5-325 MG tablet Take 1 tablet by mouth at bedtime. 30 tablet 0  . Insulin Pen Needle (BD PEN NEEDLE NANO U/F) 32G X 4 MM MISC 1 Package by Does not apply route daily at 12 noon. 100 each 0  . losartan (COZAAR) 100 MG tablet TAKE ONE TABLET BY MOUTH DAILY 90 tablet 3  . metFORMIN (GLUCOPHAGE) 500 MG tablet TAKE 1 TABLET (500 MG TOTAL) BY MOUTH 2 (TWO) TIMES DAILY WITH A MEAL. 60 tablet 0  . ONETOUCH DELICA LANCETS 00P MISC Use twice daily 100 each 3  . pramipexole (MIRAPEX) 0.25 MG tablet Take 0.5 mg by mouth 2 (two) times daily. 1630  And  Bedtime    . pregabalin (LYRICA) 75 MG capsule TAKE 1 CAPSULE BY MOUTH IN AFTERNOON, AND 2 CAPSULES AT BEDTIME 270 capsule 1  . simvastatin (ZOCOR) 40 MG tablet TAKE ONE TABLET BY MOUTH AT BEDTIME 90 tablet 3  . VICTOZA 18 MG/3ML SOPN INJECT 1.2 MG TOTAL INTO THE SKIN EVERY MORNING. 3 pen 0  . zolpidem (AMBIEN) 5 MG tablet Take 1 tablet (5 mg total) by mouth at bedtime as needed for sleep. Half tab at bedtime 30 tablet 0  . [DISCONTINUED] VESICARE  10 MG tablet TAKE 1 TABLET EVERY DAY 30 tablet 0   No current facility-administered medications on file prior to visit.     PAST MEDICAL HISTORY: Past Medical History:  Diagnosis Date  . Allergic rhinitis   . Apnea   . Arthritis   . Asthma    very mild  . Back pain   . Bilateral lower extremity edema   . Carotid artery disease ([place])    a. Carotid duplex 03/2016 - duplex was stable, 56-38% RICA, 7-56% LICA  . Complication of anesthesia    post-op delirium  . Endometrial polyp   . Essential hypertension   . GERD (gastroesophageal reflux disease)   . Heart murmur    per pt  . History of adenomatous polyp of colon    04-24-2016  tubular adenoma  .  History of squamous cell carcinoma excision    pilonidal area second excision 02-02-2002  . HTN (hypertension)   . Hyperlipidemia   . Leg pain   . OAB (overactive bladder)   . RA (rheumatoid arthritis) ([place])   . RLS (restless legs syndrome)   . Shortness of breath on exertion   . SUI (stress urinary incontinence, female)   . Venous insufficiency    legs  . Vitamin D deficiency     PAST SURGICAL HISTORY: Past Surgical History:  Procedure Laterality Date  . APPENDECTOMY  07-02-2005   [name]   open  . CARDIAC CATHETERIZATION  01-23-2009  dr Darnell Level brodie   normal coronary arteries and lvf  . CARDIOVASCULAR STRESS TEST  03/31/2016   Low risk nuclear study w/ medium defect of mild severity in basal anterior and mid anterior location w/ no evidence ischemia or infarction/  normal LV function and wall motion,  nuclear stress ef 71%  . CATARACT EXTRACTION W/ INTRAOCULAR LENS IMPLANT Left 09/2016  . COLONOSCOPY  last one 04-24-2016  . HYSTEROSCOPY W/D&C N/A 12/22/2016   Procedure: DILATATION AND CURETTAGE /HYSTEROSCOPY;  Surgeon: Dian Queen, MD;  Location: [facility];  Service: Gynecology;  Laterality: N/A;  . POSTERIOR LUMBAR FUSION  02/ 2018    [facility] ([place])  . SKIN CANCER EXCISION    . TONSILLECTOMY  child  . 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 TRANSTHORACIC ECHOCARDIOGRAM  01/21/2009   mild LVH, ef 43-32%, grade 1 diastolic dysfunction  . ULNAR NERVE TRANSPOSITION Left 12-20-2008  dr sypher   decompression and partial resection medial triceps fascia  . WIDE LOCAL EXCISION PILONIDAL AREA  02-02-2002  [name]   recurrent squamous cell carcinoma in situ    SOCIAL HISTORY: Social History   Tobacco Use  . Smoking status: Former Smoker    Packs/day: 0.50    Years: 10.00    Pack years: 5.00     Types: Cigarettes    Last attempt to quit: 12/10/1973    Years since quitting: 43.7  . Smokeless tobacco: Never Used  Substance Use Topics  . Alcohol use: Yes    Comment: occasional  . Drug use: No    FAMILY HISTORY: Family History  Problem Relation Age of Onset  . Esophageal cancer Father   . Heart disease Father        Unknown what kind  . Hyperlipidemia Father   . Hypertension Father   . Cancer Father   . Heart failure Father   .  Hyperlipidemia Mother   . Hypertension Mother   . CAD Sister        32 years younger than patient - had stent. Was told that her anorexia/bulimia may have played a role  . Stroke Maternal Grandmother 66  . Diabetes Maternal Grandmother   . Heart attack Maternal Grandfather 93  . COPD Maternal Grandfather   . Heart attack Paternal Grandmother        54s  . Diabetes Unknown        MGGM    ROS: Review of Systems  Constitutional: Positive for malaise/fatigue and weight loss.  Respiratory: Negative for cough.   Genitourinary: Negative for dysuria.  Endo/Heme/Allergies:       Negative hypoglycemia    PHYSICAL EXAM: Blood pressure 124/77, pulse (!) 111, temperature 99.2 F (37.3 C), temperature source Oral, height '5\' 5"'  (1.651 m), weight 191 lb (86.6 kg), SpO2 100 %. Body mass index is 31.78 kg/m. Physical Exam  Constitutional: She is oriented to person, place, and time. She appears well-developed and well-nourished.  Cardiovascular: Normal rate.  Murmur (2/6 early systolic murmur) heard. Pulmonary/Chest: No respiratory distress.  CIA (both) no rhonchi or wheezing  Musculoskeletal: Normal range of motion.  Neurological: She is oriented to person, place, and time.  Skin: Skin is warm and dry.  Psychiatric: She has a normal mood and affect. Her behavior is normal.  Vitals reviewed.   RECENT LABS AND TESTS: BMET    Component Value Date/Time   NA 140 06/09/2017 0848   K 4.6 06/09/2017 0848   CL 100 06/09/2017 0848   CO2 25 06/09/2017  0848   GLUCOSE 100 (H) 06/09/2017 0848   GLUCOSE 147 (H) 12/08/2016 1436   BUN 18 06/09/2017 0848   CREATININE 0.67 06/09/2017 0848   CALCIUM 9.6 06/09/2017 0848   GFRNONAA 87 06/09/2017 0848   GFRAA 100 06/09/2017 0848   Lab Results  Component Value Date   HGBA1C 5.5 06/09/2017   HGBA1C 8.2 (H) 02/23/2017   HGBA1C 5.6 12/08/2016   HGBA1C 5.3 06/04/2016   HGBA1C 5.2 01/08/2015   Lab Results  Component Value Date   INSULIN 28.3 (H) 06/09/2017   INSULIN 30.7 (H) 02/23/2017   CBC    Component Value Date/Time   WBC 4.9 02/23/2017 1007   WBC 6.8 12/15/2016 1159   RBC 3.86 02/23/2017 1007   RBC 4.23 12/15/2016 1159   HGB 11.6 02/23/2017 1007   HCT 33.9 (L) 02/23/2017 1007   PLT 242 12/15/2016 1159   MCV 88 02/23/2017 1007   MCH 30.1 02/23/2017 1007   MCH 30.3 12/15/2016 1159   MCHC 34.2 02/23/2017 1007   MCHC 34.8 12/15/2016 1159   RDW 15.0 02/23/2017 1007   LYMPHSABS 1.4 02/23/2017 1007   MONOABS 0.5 06/04/2016 0808   EOSABS 0.2 02/23/2017 1007   BASOSABS 0.0 02/23/2017 1007   Iron/TIBC/Ferritin/ %Sat    Component Value Date/Time   IRON 109 03/02/2017 1035   TIBC 333 03/19/2015 1448   FERRITIN 218.0 03/02/2017 1035   IRONPCTSAT 15 03/19/2015 1448   Lipid Panel     Component Value Date/Time   CHOL 169 06/09/2017 0848   TRIG 147 06/09/2017 0848   TRIG 101 05/19/2006 0832   HDL 52 06/09/2017 0848   CHOLHDL 4 06/04/2016 0808   VLDL 31.4 06/04/2016 0808   LDLCALC 88 06/09/2017 0848   LDLDIRECT 141.3 09/06/2012 0907   Hepatic Function Panel     Component Value Date/Time   PROT 6.4 06/09/2017 0848  ALBUMIN 4.1 06/09/2017 0848   AST 18 06/09/2017 0848   ALT 23 06/09/2017 0848   ALKPHOS 80 06/09/2017 0848   BILITOT 0.3 06/09/2017 0848   BILIDIR 0.1 12/04/2013 1724      Component Value Date/Time   TSH 4.810 (H) 02/23/2017 1007   TSH 4.22 06/04/2016 0808   TSH 3.23 10/18/2014 1056    ASSESSMENT AND PLAN: Type 2 diabetes mellitus without complication,  without long-term current use of insulin (HCC)  Dehydration  Other fatigue - Plan: EKG 12-Lead  Class 1 obesity with serious comorbidity and body mass index (BMI) of 31.0 to 31.9 in adult, unspecified obesity type  PLAN:  Diabetes II Erin Roach has been given extensive diabetes education by myself today including ideal fasting and post-prandial blood glucose readings, individual ideal Hgb A1c goals and hypoglycemia prevention. We discussed the importance of good blood sugar control to decrease the likelihood of diabetic complications such as nephropathy, neuropathy, limb loss, blindness, coronary artery disease, and death. We discussed the importance of intensive lifestyle modification including diet, exercise and weight loss as the first line treatment for diabetes. Erin Roach is to send BGs log through MyChart and we will contact her if we need to adjust medications. Erin Roach agrees to follow up with our clinic in 2 to 3 weeks.  Dehydration Erin Roach will increase H20 to 80 oz per day and will continue to monitor. Erin Roach agrees to follow up with our clinic in 2 to 3 weeks.  Fatigue Erin Roach's EKG shows sims tachycardia, lungs exam within normal limits. May be due to dehydration, she is to increase H20 and rest and monitor heart rate and temperature. She agreed to contact her primary care physician if symptoms worsen or fail to improve. Erin Roach agrees to follow up with our clinic in 2 to 3 weeks.  We spent > than 75% of the 45 minute visit on the counseling as documented in the note.  Obesity Erin Roach is currently in the action stage of change. As such, her goal is to continue with weight loss efforts She has agreed to keep a food journal with 1300-1500 calories and 90 grams of protein daily Erin Roach has been instructed to work up to a goal of 150 minutes of combined cardio and strengthening exercise per week for weight loss and overall health benefits. We discussed the following Behavioral Modification Strategies  today: increasing lean protein intake, increase H20 intake, and no skipping meals   Erin Roach has agreed to follow up with our clinic in 2 to 3 weeks. She was informed of the importance of frequent follow up visits to maximize her success with intensive lifestyle modifications for her multiple health conditions.   OBESITY BEHAVIORAL INTERVENTION VISIT  Today's visit was # 12 out of 22.  Starting weight: 223 lbs Starting date: 02/23/17 Today's weight : 191 lbs Today's date: 08/25/2017 Total lbs lost to date: 41 (Patients must lose 7 lbs in the first 6 months to continue with counseling)   ASK: We discussed the diagnosis of obesity with [name] today and Erin Roach agreed to give [name] permission to discuss obesity behavioral modification therapy today.  ASSESS: Erin Roach has the diagnosis of obesity and her BMI today is 31.78 Erin Roach is in the action stage of change   ADVISE: Erin Roach was educated on the multiple health risks of obesity as well as the benefit of weight loss to improve her health. She was advised of the need for long term treatment and the importance of lifestyle modifications.  AGREE:  Multiple dietary modification options and treatment options were discussed and  Erin Roach agreed to the above obesity treatment plan.  Wilhemena Durie, am acting as transcriptionist for Dennard Nip, MD

## 2017-08-31 ENCOUNTER — Other Ambulatory Visit (INDEPENDENT_AMBULATORY_CARE_PROVIDER_SITE_OTHER): Payer: Self-pay | Admitting: Family Medicine

## 2017-08-31 DIAGNOSIS — E119 Type 2 diabetes mellitus without complications: Secondary | ICD-10-CM

## 2017-09-06 ENCOUNTER — Other Ambulatory Visit: Payer: Self-pay | Admitting: Internal Medicine

## 2017-09-06 ENCOUNTER — Other Ambulatory Visit (INDEPENDENT_AMBULATORY_CARE_PROVIDER_SITE_OTHER): Payer: Self-pay | Admitting: Family Medicine

## 2017-09-06 DIAGNOSIS — E119 Type 2 diabetes mellitus without complications: Secondary | ICD-10-CM

## 2017-09-06 MED ORDER — HYDROCODONE-ACETAMINOPHEN 5-325 MG PO TABS: 1.0000 | ORAL_TABLET | Freq: Every day | ORAL | 0 refills | Status: DC

## 2017-09-06 NOTE — Telephone Encounter (Signed)
Copied from [place] [id number]. Topic: Quick Communication - Rx Refill/Question >> Sep 06, 2017 12:08 PM Waylan Rocher, Lumin L wrote: Medication: HYDROcodone-acetaminophen (NORCO/VICODIN) 5-325 MG tablet (2 days left) Has the patient contacted their pharmacy? Yes.   (Agent: If no, request that the patient contact the pharmacy for the refill.) Preferred Pharmacy (with phone number or street name): [facility] [address] [place] - [address] [place] [address] Phone: [phone] Fax: [phone] Agent: Please be advised that RX refills may take up to 3 business days. We ask that you follow-up with your pharmacy.  Wants in house Chauncey to call her once sent to pharmacy.

## 2017-09-06 NOTE — Telephone Encounter (Signed)
LOV 06/15/17 Dr. Quay Burow Last refill 08/11/17 #30 0 refills.

## 2017-09-06 NOTE — Telephone Encounter (Signed)
Check [place] registry last filled 08/12/2017.Marland KitchenJohny Chess

## 2017-09-08 ENCOUNTER — Other Ambulatory Visit: Payer: Self-pay | Admitting: Internal Medicine

## 2017-09-08 NOTE — Telephone Encounter (Signed)
[place] Controlled Substance Database checked. Last filled on 06/14/17

## 2017-09-13 ENCOUNTER — Ambulatory Visit (INDEPENDENT_AMBULATORY_CARE_PROVIDER_SITE_OTHER): Payer: Medicare Other | Admitting: Physician Assistant

## 2017-09-13 ENCOUNTER — Encounter (INDEPENDENT_AMBULATORY_CARE_PROVIDER_SITE_OTHER): Payer: Self-pay | Admitting: Family Medicine

## 2017-09-13 ENCOUNTER — Other Ambulatory Visit (INDEPENDENT_AMBULATORY_CARE_PROVIDER_SITE_OTHER): Payer: Self-pay

## 2017-09-13 DIAGNOSIS — E119 Type 2 diabetes mellitus without complications: Secondary | ICD-10-CM

## 2017-09-13 MED ORDER — METFORMIN HCL 500 MG PO TABS: 500.0000 mg | ORAL_TABLET | Freq: Two times a day (BID) | ORAL | 0 refills | Status: DC

## 2017-09-28 ENCOUNTER — Ambulatory Visit (INDEPENDENT_AMBULATORY_CARE_PROVIDER_SITE_OTHER): Payer: Medicare Other | Admitting: Physician Assistant

## 2017-09-28 VITALS — BP 141/82 | HR 79 | Temp 98.3°F | Ht 65.0 in | Wt 193.0 lb

## 2017-09-28 DIAGNOSIS — Z6832 Body mass index (BMI) 32.0-32.9, adult: Secondary | ICD-10-CM | POA: Diagnosis not present

## 2017-09-28 DIAGNOSIS — E119 Type 2 diabetes mellitus without complications: Secondary | ICD-10-CM | POA: Diagnosis not present

## 2017-09-28 DIAGNOSIS — E669 Obesity, unspecified: Secondary | ICD-10-CM

## 2017-09-28 DIAGNOSIS — E7849 Other hyperlipidemia: Secondary | ICD-10-CM | POA: Diagnosis not present

## 2017-09-28 DIAGNOSIS — R5383 Other fatigue: Secondary | ICD-10-CM

## 2017-09-28 DIAGNOSIS — I10 Essential (primary) hypertension: Secondary | ICD-10-CM | POA: Diagnosis not present

## 2017-09-28 DIAGNOSIS — J3089 Other allergic rhinitis: Secondary | ICD-10-CM | POA: Diagnosis not present

## 2017-09-28 MED ORDER — FLUTICASONE PROPIONATE 50 MCG/ACT NA SUSP: 1.0000 | Freq: Every day | NASAL | 0 refills | Status: DC

## 2017-09-28 NOTE — Progress Notes (Signed)
Office: [phone]  /  Fax: [phone]   HPI:   Chief Complaint: OBESITY Erin Roach is here to discuss her progress with her obesity treatment plan. She is on the keep a food journal with 1300 to 1500 calories and 90 grams of protein daily and is following her eating plan approximately 70 % of the time. She states she is walking for 20 minutes 3 to 4 times per week. Erin Roach had increased celebration eating and found it hard to journal her meals. Erin Roach states she is motivated to get back on track and continue with weight loss. Her weight is 193 lb (87.5 kg) today and has had a weight gain of 2 pounds over a period of 5 weeks since her last visit. She has lost 30 lbs since starting treatment with [name].  Hypertension Erin Roach is a 77 y.o. female with hypertension. Her blood pressure is elevated today at 141/82. She has not taken her blood pressure medications today. Erin Roach is on Losartan and Lasix for edema. Erin Roach denies chest pain or shortness of breath on exertion. She is working weight loss to help control her blood pressure with the goal of decreasing her risk of heart attack and stroke. Erin Roach blood pressure is not currently controlled.  Diabetes II without complications, non insulin Erin Roach has a diagnosis of diabetes type II. Erin Roach states fasting BGs range between 110 and 150's, post prandial BGs range between 120 and 170 and she denies any hypoglycemic episodes. Last A1c was at 5.5 Erin Roach is on Victoza and Metformin currently. She has been working on intensive lifestyle modifications including diet, exercise, and weight loss to help control her blood glucose levels.  Hyperlipidemia Erin Roach has hyperlipidemia and is currently on Zocor. She has been trying to improve her cholesterol levels with intensive lifestyle modification including a low saturated fat diet, exercise and weight loss. She denies any chest pain or claudication.  Allergic Rhinitis Erin Roach has a diagnosis of allergic  rhinitis with sneezing occasionally due to seasonal allergies. Erin Roach denies any fever, cough, sore throat, chest pain or dyspnea.  Fatigue Erin Roach  Complains of ongoing fatigue. She has a history of OSA on CPAP and states she has been using her machine every night. Denies any cp, dyspnea.  ALLERGIES: No Known Allergies  MEDICATIONS: Current Outpatient Medications on File Prior to Visit  Medication Sig Dispense Refill  . Blood Glucose Monitoring Suppl (ONE TOUCH ULTRA 2) w/Device KIT USE TO TEST BLOOD SUGARS UP TO 4 TIMES DAILY 1 each 0  . Cholecalciferol (VITAMIN D) 2000 units tablet Take 1 tablet (2,000 Units total) by mouth daily.    . furosemide (LASIX) 20 MG tablet TAKE ONE TABLET BY MOUTH DAILY (Patient taking differently: TAKE TWO TABLET BY MOUTH DAILY-- takes in am) 90 tablet 3  . glucose blood (ONE TOUCH ULTRA TEST) test strip USE AS INSTRUCTED TWICE DAILY 100 each 3  . HYDROcodone-acetaminophen (NORCO/VICODIN) 5-325 MG tablet Take 1 tablet by mouth at bedtime. 30 tablet 0  . Insulin Pen Needle (BD PEN NEEDLE NANO U/F) 32G X 4 MM MISC 1 Package by Does not apply route daily at 12 noon. 100 each 0  . losartan (COZAAR) 100 MG tablet TAKE ONE TABLET BY MOUTH DAILY 90 tablet 3  . metFORMIN (GLUCOPHAGE) 500 MG tablet Take 1 tablet (500 mg total) by mouth 2 (two) times daily with a meal. 60 tablet 0  . ONETOUCH DELICA LANCETS 17G MISC Use twice daily 100 each 3  . pramipexole (  MIRAPEX) 0.25 MG tablet Take 0.5 mg by mouth 2 (two) times daily. 1630  And  Bedtime    . pregabalin (LYRICA) 75 MG capsule TAKE 1 CAPSULE BY MOUTH IN THE AFTERNOON AND TAKE 2 CAPSULES AT BEDTIME 270 capsule 0  . simvastatin (ZOCOR) 40 MG tablet TAKE ONE TABLET BY MOUTH AT BEDTIME 90 tablet 3  . VICTOZA 18 MG/3ML SOPN INJECT 1.2 MG TOTAL INTO THE SKIN EVERY MORNING. 3 pen 0  . zolpidem (AMBIEN) 5 MG tablet Take 1 tablet (5 mg total) by mouth at bedtime as needed for sleep. Half tab at bedtime 30 tablet 0  .  [DISCONTINUED] VESICARE 10 MG tablet TAKE 1 TABLET EVERY DAY 30 tablet 0   No current facility-administered medications on file prior to visit.     PAST MEDICAL HISTORY: Past Medical History:  Diagnosis Date  . Allergic rhinitis   . Apnea   . Arthritis   . Asthma    very mild  . Back pain   . Bilateral lower extremity edema   . Carotid artery disease ([place])    a. Carotid duplex 03/2016 - duplex was stable, 23-36% RICA, 1-22% LICA  . Complication of anesthesia    post-op delirium  . Endometrial polyp   . Essential hypertension   . GERD (gastroesophageal reflux disease)   . Heart murmur    per pt  . History of adenomatous polyp of colon    04-24-2016  tubular adenoma  . History of squamous cell carcinoma excision    pilonidal area second excision 02-02-2002  . HTN (hypertension)   . Hyperlipidemia   . Leg pain   . OAB (overactive bladder)   . RA (rheumatoid arthritis) ([place])   . RLS (restless legs syndrome)   . Shortness of breath on exertion   . SUI (stress urinary incontinence, female)   . Venous insufficiency    legs  . Vitamin D deficiency     PAST SURGICAL HISTORY: Past Surgical History:  Procedure Laterality Date  . APPENDECTOMY  07-02-2005   [name]   open  . CARDIAC CATHETERIZATION  01-23-2009  dr Darnell Level brodie   normal coronary arteries and lvf  . CARDIOVASCULAR STRESS TEST  03/31/2016   Low risk nuclear study w/ medium defect of mild severity in basal anterior and mid anterior location w/ no evidence ischemia or infarction/  normal LV function and wall motion,  nuclear stress ef 71%  . CATARACT EXTRACTION W/ INTRAOCULAR LENS IMPLANT Left 09/2016  . COLONOSCOPY  last one 04-24-2016  . HYSTEROSCOPY W/D&C N/A 12/22/2016   Procedure: DILATATION AND CURETTAGE /HYSTEROSCOPY;  Surgeon: Dian Queen, MD;  Location: [facility];  Service: Gynecology;  Laterality: N/A;  . POSTERIOR LUMBAR FUSION  02/ 2018    [facility] ([place])  .  SKIN CANCER EXCISION    . TONSILLECTOMY  child  . 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 TRANSTHORACIC ECHOCARDIOGRAM  01/21/2009   mild LVH, ef 44-97%, grade 1 diastolic dysfunction  . ULNAR NERVE TRANSPOSITION Left 12-20-2008  dr sypher   decompression and partial resection medial triceps fascia  . WIDE LOCAL EXCISION PILONIDAL AREA  02-02-2002  [name]   recurrent squamous cell carcinoma in situ    SOCIAL HISTORY: Social History   Tobacco Use  . Smoking status: Former Smoker    Packs/day: 0.50  Years: 10.00    Pack years: 5.00    Types: Cigarettes    Last attempt to quit: 12/10/1973    Years since quitting: 43.8  . Smokeless tobacco: Never Used  Substance Use Topics  . Alcohol use: Yes    Comment: occasional  . Drug use: No    FAMILY HISTORY: Family History  Problem Relation Age of Onset  . Esophageal cancer Father   . Heart disease Father        Unknown what kind  . Hyperlipidemia Father   . Hypertension Father   . Cancer Father   . Heart failure Father   . Hyperlipidemia Mother   . Hypertension Mother   . CAD Sister        69 years younger than patient - had stent. Was told that her anorexia/bulimia may have played a role  . Stroke Maternal Grandmother 66  . Diabetes Maternal Grandmother   . Heart attack Maternal Grandfather 93  . COPD Maternal Grandfather   . Heart attack Paternal Grandmother        79s  . Diabetes Unknown        MGGM    ROS: Review of Systems  Constitutional: Positive for malaise/fatigue. Negative for fever and weight loss.  HENT: Negative for sore throat.   Respiratory: Negative for cough and shortness of breath (on exertion).   Cardiovascular: Negative for chest pain and claudication.    PHYSICAL EXAM: Blood pressure (!) 141/82, pulse 79, temperature 98.3 F (36.8 C), height _0  (1.651  m), weight 193 lb (87.5 kg), SpO2 98 %. Body mass index is 32.12 kg/m. Physical Exam  Constitutional: She is oriented to person, place, and time. She appears well-developed and well-nourished.  Cardiovascular: Normal rate.  Pulmonary/Chest: Effort normal.  Musculoskeletal: Normal range of motion.  Neurological: She is oriented to person, place, and time.  Skin: Skin is warm and dry.  Psychiatric: She has a normal mood and affect. Her behavior is normal.  Vitals reviewed.   RECENT LABS AND TESTS: BMET    Component Value Date/Time   NA 141 09/28/2017 0906   K 4.3 09/28/2017 0906   CL 105 09/28/2017 0906   CO2 19 (L) 09/28/2017 0906   GLUCOSE 94 09/28/2017 0906   GLUCOSE 147 (H) 12/08/2016 1436   BUN 15 09/28/2017 0906   CREATININE 0.67 09/28/2017 0906   CALCIUM 8.9 09/28/2017 0906   GFRNONAA 87 09/28/2017 0906   GFRAA 100 09/28/2017 0906   Lab Results  Component Value Date   HGBA1C 5.4 09/28/2017   HGBA1C 5.5 06/09/2017   HGBA1C 8.2 (H) 02/23/2017   HGBA1C 5.6 12/08/2016   HGBA1C 5.3 06/04/2016   Lab Results  Component Value Date   INSULIN 17.0 09/28/2017   INSULIN 28.3 (H) 06/09/2017   INSULIN 30.7 (H) 02/23/2017   CBC    Component Value Date/Time   WBC 6.2 09/28/2017 0906   WBC 6.8 12/15/2016 1159   RBC 4.07 09/28/2017 0906   RBC 4.23 12/15/2016 1159   HGB 11.8 09/28/2017 0906   HCT 35.4 09/28/2017 0906   PLT 242 12/15/2016 1159   MCV 87 09/28/2017 0906   MCH 29.0 09/28/2017 0906   MCH 30.3 12/15/2016 1159   MCHC 33.3 09/28/2017 0906   MCHC 34.8 12/15/2016 1159   RDW 14.3 09/28/2017 0906   LYMPHSABS 1.5 09/28/2017 0906   MONOABS 0.5 06/04/2016 0808   EOSABS 0.2 09/28/2017 0906   BASOSABS 0.0 09/28/2017 0906   Iron/TIBC/Ferritin/ %Sat  Component Value Date/Time   IRON 109 03/02/2017 1035   TIBC 333 03/19/2015 1448   FERRITIN 218.0 03/02/2017 1035   IRONPCTSAT 15 03/19/2015 1448   Lipid Panel     Component Value Date/Time   CHOL 173  09/28/2017 0906   TRIG 140 09/28/2017 0906   TRIG 101 05/19/2006 0832   HDL 50 09/28/2017 0906   CHOLHDL 4 06/04/2016 0808   VLDL 31.4 06/04/2016 0808   LDLCALC 95 09/28/2017 0906   LDLDIRECT 141.3 09/06/2012 0907   Hepatic Function Panel     Component Value Date/Time   PROT 6.1 09/28/2017 0906   ALBUMIN 4.0 09/28/2017 0906   AST 15 09/28/2017 0906   ALT 16 09/28/2017 0906   ALKPHOS 72 09/28/2017 0906   BILITOT 0.5 09/28/2017 0906   BILIDIR 0.1 12/04/2013 1724      Component Value Date/Time   TSH 4.050 09/28/2017 0906   TSH 4.810 (H) 02/23/2017 1007   TSH 4.22 06/04/2016 0808   Results for RIKKI, TROSPER (MRN [id number]) as of 09/28/2017 10:29  Ref. Range 06/09/2017 08:48  Vitamin D, 25-Hydroxy Latest Ref Range: 30.0 - 100.0 ng/mL 38.1   ASSESSMENT AND PLAN: Type 2 diabetes mellitus without complication, without long-term current use of insulin (HCC) - Plan: Comprehensive metabolic panel, Hemoglobin A1c, Insulin, random  Essential hypertension  Other hyperlipidemia - Plan: Lipid Panel With LDL/HDL Ratio  Seasonal allergic rhinitis due to other allergic trigger - Plan: fluticasone (FLONASE) 50 MCG/ACT nasal spray  Other fatigue - Plan: CBC With Differential, VITAMIN D 25 Hydroxy (Vit-D Deficiency, Fractures), T3, T4, free, TSH  Class 1 obesity with serious comorbidity and body mass index (BMI) of 32.0 to 32.9 in adult, unspecified obesity type  PLAN:  Hypertension We discussed sodium restriction, working on healthy weight loss, and a regular exercise program as the means to achieve improved blood pressure control. Erin Roach agreed with this plan and agreed to follow up as directed. We will continue to monitor her blood pressure as well as her progress with the above lifestyle modifications. She will continue her medications as prescribed and will watch for signs of hypotension as she continues her lifestyle modifications. We will check CMP.  Diabetes II without  complications, non insulin Erin Roach has been given extensive diabetes education by myself today including ideal fasting and post-prandial blood glucose readings, individual ideal Hgb A1c goals and hypoglycemia prevention. We discussed the importance of good blood sugar control to decrease the likelihood of diabetic complications such as nephropathy, neuropathy, limb loss, blindness, coronary artery disease, and death. We discussed the importance of intensive lifestyle modification including diet, exercise and weight loss as the first line treatment for diabetes. We will check labs and Erin Roach agrees to continue her diabetes medications and will follow up at the agreed upon time.  Hyperlipidemia Erin Roach was informed of the American Heart Association Guidelines emphasizing intensive lifestyle modifications as the first line treatment for hyperlipidemia. We discussed many lifestyle modifications today in depth, and Erin Roach will continue to work on decreasing saturated fats such as fatty red meat, butter and many fried foods. She will also increase vegetables and lean protein in her diet and continue to work on exercise and weight loss efforts. We will check labs and Erin Roach will continue her medications as prescribed.  Allergic Rhinitis Erin Roach agrees to continue fluticasone 1 spray into both nostrils daily and follow up with our clinic in 2 weeks.  Fatigue Erin Roach was informed that her fatigue may be related to obesity, depression  or many other causes. We will check CBC and thyroid labs, and in the meanwhile Erin Roach has agreed to continue to work on diet, exercise and weight loss to help with fatigue. Proper sleep hygiene was discussed including the need for 7-8 hours of quality sleep each night.  Obesity Erin Roach is currently in the action stage of change. As such, her goal is to continue with weight loss efforts She has agreed to keep a food journal with 1300 to 1500 calories and 90 grams of protein daily Shernell has been  instructed to work up to a goal of 150 minutes of combined cardio and strengthening exercise per week for weight loss and overall health benefits. We discussed the following Behavioral Modification Strategies today: increasing lean protein intake and keep a strict food journal  Lakendria has agreed to follow up with our clinic in 2 weeks. She was informed of the importance of frequent follow up visits to maximize her success with intensive lifestyle modifications for her multiple health conditions.   OBESITY BEHAVIORAL INTERVENTION VISIT  Today's visit was # 13 out of 22.  Starting weight: 223 lbs Starting date: 02/23/17 Today's weight : 193 lbs  Today's date: 09/30/2017 Total lbs lost to date: 30 (Patients must lose 7 lbs in the first 6 months to continue with counseling)   ASK: We discussed the diagnosis of obesity with Erin Roach today and Erin Roach agreed to give [name] permission to discuss obesity behavioral modification therapy today.  ASSESS: Tahani has the diagnosis of obesity and her BMI today is 32.12 Galadriel is in the action stage of change   ADVISE: Harold was educated on the multiple health risks of obesity as well as the benefit of weight loss to improve her health. She was advised of the need for long term treatment and the importance of lifestyle modifications.  AGREE: Multiple dietary modification options and treatment options were discussed and  Jannine agreed to the above obesity treatment plan.   Corey Skains, am acting as transcriptionist for [employer], PA-C I, Lacy Duverney [facility], have reviewed this note and agree with its content

## 2017-09-29 LAB — CBC WITH DIFFERENTIAL
BASOS ABS: 0 10*3/uL (ref 0.0–0.2)
Basos: 1 %
EOS (ABSOLUTE): 0.2 10*3/uL (ref 0.0–0.4)
Eos: 4 %
Hematocrit: 35.4 % (ref 34.0–46.6)
Hemoglobin: 11.8 g/dL (ref 11.1–15.9)
IMMATURE GRANULOCYTES: 0 %
Immature Grans (Abs): 0 10*3/uL (ref 0.0–0.1)
LYMPHS ABS: 1.5 10*3/uL (ref 0.7–3.1)
LYMPHS: 24 %
MCH: 29 pg (ref 26.6–33.0)
MCHC: 33.3 g/dL (ref 31.5–35.7)
MCV: 87 fL (ref 79–97)
MONOS ABS: 0.5 10*3/uL (ref 0.1–0.9)
Monocytes: 8 %
NEUTROS ABS: 3.9 10*3/uL (ref 1.4–7.0)
Neutrophils: 63 %
RBC: 4.07 x10E6/uL (ref 3.77–5.28)
RDW: 14.3 % (ref 12.3–15.4)
WBC: 6.2 10*3/uL (ref 3.4–10.8)

## 2017-09-29 LAB — COMPREHENSIVE METABOLIC PANEL
ALK PHOS: 72 IU/L (ref 39–117)
ALT: 16 IU/L (ref 0–32)
AST: 15 IU/L (ref 0–40)
Albumin/Globulin Ratio: 1.9 (ref 1.2–2.2)
Albumin: 4 g/dL (ref 3.5–4.8)
BUN/Creatinine Ratio: 22 (ref 12–28)
BUN: 15 mg/dL (ref 8–27)
Bilirubin Total: 0.5 mg/dL (ref 0.0–1.2)
CO2: 19 mmol/L — AB (ref 20–29)
CREATININE: 0.67 mg/dL (ref 0.57–1.00)
Calcium: 8.9 mg/dL (ref 8.7–10.3)
Chloride: 105 mmol/L (ref 96–106)
GFR calc Af Amer: 100 mL/min/{1.73_m2} (ref >59)
GFR calc non Af Amer: 87 mL/min/{1.73_m2} (ref >59)
Globulin, Total: 2.1 g/dL (ref 1.5–4.5)
Glucose: 94 mg/dL (ref 65–99)
Potassium: 4.3 mmol/L (ref 3.5–5.2)
Sodium: 141 mmol/L (ref 134–144)
Total Protein: 6.1 g/dL (ref 6.0–8.5)

## 2017-09-29 LAB — INSULIN, RANDOM: INSULIN: 17 u[IU]/mL (ref 2.6–24.9)

## 2017-09-29 LAB — T4, FREE: Free T4: 0.87 ng/dL (ref 0.82–1.77)

## 2017-09-29 LAB — LIPID PANEL WITH LDL/HDL RATIO
Cholesterol, Total: 173 mg/dL (ref 100–199)
HDL: 50 mg/dL (ref >39)
LDL CALC: 95 mg/dL (ref 0–99)
LDL/HDL RATIO: 1.9 ratio (ref 0.0–3.2)
TRIGLYCERIDES: 140 mg/dL (ref 0–149)
VLDL CHOLESTEROL CAL: 28 mg/dL (ref 5–40)

## 2017-09-29 LAB — T3: T3, Total: 109 ng/dL (ref 71–180)

## 2017-09-29 LAB — HEMOGLOBIN A1C
Est. average glucose Bld gHb Est-mCnc: 108 mg/dL
Hgb A1c MFr Bld: 5.4 % (ref 4.8–5.6)

## 2017-09-29 LAB — TSH: TSH: 4.05 u[IU]/mL (ref 0.450–4.500)

## 2017-09-29 LAB — VITAMIN D 25 HYDROXY (VIT D DEFICIENCY, FRACTURES): Vit D, 25-Hydroxy: 34.5 ng/mL (ref 30.0–100.0)

## 2017-10-02 ENCOUNTER — Other Ambulatory Visit (INDEPENDENT_AMBULATORY_CARE_PROVIDER_SITE_OTHER): Payer: Self-pay | Admitting: Family Medicine

## 2017-10-02 DIAGNOSIS — E119 Type 2 diabetes mellitus without complications: Secondary | ICD-10-CM

## 2017-10-06 ENCOUNTER — Encounter: Payer: Self-pay | Admitting: Internal Medicine

## 2017-10-06 ENCOUNTER — Other Ambulatory Visit: Payer: Self-pay | Admitting: Internal Medicine

## 2017-10-07 MED ORDER — HYDROCODONE-ACETAMINOPHEN 5-325 MG PO TABS: 1.0000 | ORAL_TABLET | Freq: Every day | ORAL | 0 refills | Status: DC

## 2017-10-07 NOTE — Telephone Encounter (Signed)
[place] Controlled Substance Database checked. Last filled on 09/06/17

## 2017-10-10 ENCOUNTER — Other Ambulatory Visit: Payer: Self-pay | Admitting: Internal Medicine

## 2017-10-12 ENCOUNTER — Ambulatory Visit (INDEPENDENT_AMBULATORY_CARE_PROVIDER_SITE_OTHER): Payer: Self-pay

## 2017-10-12 ENCOUNTER — Encounter (INDEPENDENT_AMBULATORY_CARE_PROVIDER_SITE_OTHER): Payer: Self-pay | Admitting: Orthopaedic Surgery

## 2017-10-12 ENCOUNTER — Ambulatory Visit (INDEPENDENT_AMBULATORY_CARE_PROVIDER_SITE_OTHER): Payer: Medicare Other | Admitting: Orthopaedic Surgery

## 2017-10-12 VITALS — BP 141/78 | HR 90 | Ht 66.0 in | Wt 188.0 lb

## 2017-10-12 DIAGNOSIS — M25552 Pain in left hip: Secondary | ICD-10-CM

## 2017-10-12 MED ORDER — BUPIVACAINE HCL 0.5 % IJ SOLN
2.0000 mL | INTRAMUSCULAR | Status: AC | PRN
Start: 2017-10-12 — End: 2017-10-12
  Administered 2017-10-12: 2 mL via INTRA_ARTICULAR

## 2017-10-12 MED ORDER — LIDOCAINE HCL 1 % IJ SOLN
2.0000 mL | INTRAMUSCULAR | Status: AC | PRN
  Administered 2017-10-12: 2 mL

## 2017-10-12 MED ORDER — METHYLPREDNISOLONE ACETATE 40 MG/ML IJ SUSP
80.0000 mg | INTRAMUSCULAR | Status: AC | PRN
  Administered 2017-10-12: 80 mg

## 2017-10-12 NOTE — Progress Notes (Signed)
Office Visit Note   Patient: Erin Roach           Date of Birth: May 07, 1941           MRN: [id number] Visit Date: 10/12/2017              Requested by: Binnie Rail, MD [place], [place] [address] PCP: Binnie Rail, MD   Assessment & Plan: Visit Diagnoses:  1. Pain in left hip     Plan: Pain along the lateral aspect of the left hip might be bursitis.  Will inject with cortisone and monitor response.  I do not believe this originates from the left hip.  Certainly is a possibility that it could be referred from the lumbar spine having had a prior appears to be some degenerative change at L5-S1 the one AP view of the pelvis.  Did feel little better after leaving the office  Follow-Up Instructions: Return if symptoms worsen or fail to improve.   Orders:  Orders Placed This Encounter  Procedures  . Large Joint Inj: L greater trochanter  . XR HIP UNILAT W OR W/O PELVIS 2-3 VIEWS LEFT   No orders of the defined types were placed in this encounter.     Procedures: Large Joint Inj: L greater trochanter on 10/12/2017 5:18 PM Indications: pain and diagnostic evaluation Details: 25 G 1.5 in needle, lateral approach  Arthrogram: No  Medications: 2 mL bupivacaine 0.5 %; 2 mL lidocaine 1 %; 80 mg methylPREDNISolone acetate 40 MG/ML Procedure, treatment alternatives, risks and benefits explained, specific risks discussed. Consent was given by the patient. Immediately prior to procedure a time out was called to verify the correct patient, procedure, equipment, support staff and site/side marked as required. Patient was prepped and draped in the usual sterile fashion.       Clinical Data: No additional findings.   Subjective: Chief Complaint  Patient presents with  . Left Hip - Pain  . Left Knee - Pain    04/09/2015 LEFT KNEE SURGERY WITH DR. Charm Rings, STILL HAVING PAIN  . New Patient (Initial Visit)    L HIP PAIN FOR 2 WEEKS, NO INJURY. L KNEE SURGERY 04/09/2015  NOT SURE IF RELATED  Erin Roach has developed progressive pain along the lateral aspect of her left hip over the last several weeks.  Denies any history of injury or trauma.  She and her husband are planning a trip concerned about her ability to walk comfortably.  Pain is localized on the lateral aspect of her hip.  She has had a prior left total knee replacement with some persistent issues but this is totally separate.  She is also had a prior L4-5 fusion no specific back pain.  No groin pain.  No numbness or tingling.  No fever or chills.  HPI  Review of Systems  Constitutional: Negative for fatigue and fever.  HENT: Negative for ear pain.   Eyes: Negative for pain.  Respiratory: Negative for cough and shortness of breath.   Cardiovascular: Positive for leg swelling.  Gastrointestinal: Negative for constipation and diarrhea.  Genitourinary: Negative for difficulty urinating.  Musculoskeletal: Positive for back pain. Negative for neck pain.  Skin: Negative for rash.  Allergic/Immunologic: Negative for food allergies.  Neurological: Positive for weakness. Negative for numbness.  Hematological: Bruises/bleeds easily.  Psychiatric/Behavioral: Positive for sleep disturbance.     Objective: Vital Signs: BP (!) 141/78 (BP Location: Left Arm, Patient Position: Sitting, Cuff Size: Normal)   Pulse  90   Ht 5\' 6"  (1.676 m)   Wt 188 lb (85.3 kg)   BMI 30.34 kg/m   Physical Exam  Constitutional: She is oriented to person, place, and time. She appears well-developed and well-nourished.  HENT:  Mouth/Throat: Oropharynx is clear and moist.  Eyes: Pupils are equal, round, and reactive to light. EOM are normal.  Pulmonary/Chest: Effort normal.  Neurological: She is alert and oriented to person, place, and time.  Skin: Skin is warm and dry.  Psychiatric: She has a normal mood and affect. Her behavior is normal.    Ortho Exam awake alert and oriented x3.  Comfortable sitting.  No pain with range of  motion of the left hip with internal or external rotation.  Does have some local tenderness over the greater trochanter of the left hip without skin change.  No percussible back pain.  Straight leg raise negative.  Left knee without effusion.  Specialty Comments:  No specialty comments available.  Imaging: Xr Hip Unilat W Or W/o Pelvis 2-3 Views Left  Result Date: 10/12/2017 AP the pelvis did not demonstrate any obvious left hip pathology with the patient is symptomatic.  No normalities about the greater tuberosity    PMFS History: Patient Active Problem List   Diagnosis Date Noted  . OSA on CPAP 06/15/2017  . Diabetes ([place]) 03/02/2017  . Herpes zoster without complication 96/78/9381  . Shortness of breath on exertion 02/23/2017  . B12 nutritional deficiency 02/23/2017  . Vitamin D deficiency 12/08/2016  . Tubular adenoma of colon 04/29/2016  . Essential hypertension 09/12/2015  . Allergic rhinitis 07/17/2015  . Insomnia 07/17/2015  . Obese 04/10/2015  . S/P left TKA 04/09/2015  . Osteopenia 12/04/2012  . 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 RESTLESS LEG SYNDROME 02/11/2009  . LEG EDEMA, BILATERAL 02/11/2009  . Hyperlipidemia 10/30/2008  . Urinary incontinence 10/30/2008  . Skin cancer 10/30/2008  . Asthma 11/21/2007  . Venous (peripheral) insufficiency 11/22/2006  . SNORING 11/22/2006   Past Medical History:  Diagnosis Date  . Allergic rhinitis   . Apnea   . Arthritis   . Asthma    very mild  . Back pain   . Bilateral lower extremity edema   . Carotid artery disease ([place])    a. Carotid duplex 03/2016 - duplex was stable, 01-75% RICA, 1-02% LICA  . Complication of anesthesia    post-op delirium  . Endometrial polyp   . Essential hypertension   . GERD (gastroesophageal reflux disease)   . Heart murmur    per pt  . History of adenomatous polyp of colon    04-24-2016  tubular adenoma  . History of squamous cell carcinoma excision    pilonidal area second excision  02-02-2002  . HTN (hypertension)   . Hyperlipidemia   . Leg pain   . OAB (overactive bladder)   . RA (rheumatoid arthritis) ([place])   . RLS (restless legs syndrome)   . Shortness of breath on exertion   . SUI (stress urinary incontinence, female)   . Venous insufficiency    legs  . Vitamin D deficiency     Family History  Problem Relation Age of Onset  . Esophageal cancer Father   . Heart disease Father        Unknown what kind  . Hyperlipidemia Father   . Hypertension Father   . Cancer Father   . Heart failure Father   . Hyperlipidemia Mother   . Hypertension Mother   . CAD Sister  10 years younger than patient - had stent. Was told that her anorexia/bulimia may have played a role  . Stroke Maternal Grandmother 66  . Diabetes Maternal Grandmother   . Heart attack Maternal Grandfather 93  . COPD Maternal Grandfather   . Heart attack Paternal Grandmother        63s  . Diabetes Unknown        MGGM    Past Surgical History:  Procedure Laterality Date  . APPENDECTOMY  07-02-2005   [name]   open  . CARDIAC CATHETERIZATION  01-23-2009  dr Darnell Level brodie   normal coronary arteries and lvf  . CARDIOVASCULAR STRESS TEST  03/31/2016   Low risk nuclear study w/ medium defect of mild severity in basal anterior and mid anterior location w/ no evidence ischemia or infarction/  normal LV function and wall motion,  nuclear stress ef 71%  . CATARACT EXTRACTION W/ INTRAOCULAR LENS IMPLANT Left 09/2016  . COLONOSCOPY  last one 04-24-2016  . HYSTEROSCOPY W/D&C N/A 12/22/2016   Procedure: DILATATION AND CURETTAGE /HYSTEROSCOPY;  Surgeon: Dian Queen, MD;  Location: [facility];  Service: Gynecology;  Laterality: N/A;  . POSTERIOR LUMBAR FUSION  02/ 2018    [facility] ([place])  . SKIN CANCER EXCISION    . TONSILLECTOMY  child  . TOTAL KNEE ARTHROPLASTY Left 04/09/2015   Procedure: LEFT TOTAL KNEE ARTHROPLASTY;  Surgeon: Paralee Cancel, MD;  Location:  [facility];  Service: Orthopedics;  Laterality: Left;  . TRANSTHORACIC ECHOCARDIOGRAM  01/21/2009   mild LVH, ef 01-02%, grade 1 diastolic dysfunction  . ULNAR NERVE TRANSPOSITION Left 12-20-2008  dr sypher   decompression and partial resection medial triceps fascia  . WIDE LOCAL EXCISION PILONIDAL AREA  02-02-2002  [name]   recurrent squamous cell carcinoma in situ   Social History   Occupational History  . Occupation: retired Pharmacist, hospital  Tobacco Use  . Smoking status: Former Smoker    Packs/day: 0.50    Years: 10.00    Pack years: 5.00    Types: Cigarettes    Last attempt to quit: 12/10/1973    Years since quitting: 43.8  . Smokeless tobacco: Never Used  Substance and Sexual Activity  . Alcohol use: Yes    Comment: occasional  . Drug use: No  . Sexual activity: Not on file

## 2017-10-13 ENCOUNTER — Encounter (INDEPENDENT_AMBULATORY_CARE_PROVIDER_SITE_OTHER): Payer: Self-pay | Admitting: Family Medicine

## 2017-10-18 ENCOUNTER — Ambulatory Visit (INDEPENDENT_AMBULATORY_CARE_PROVIDER_SITE_OTHER): Payer: Medicare Other | Admitting: Family Medicine

## 2017-10-18 VITALS — BP 127/81 | HR 85 | Temp 98.3°F | Ht 66.0 in | Wt 188.0 lb

## 2017-10-18 DIAGNOSIS — E119 Type 2 diabetes mellitus without complications: Secondary | ICD-10-CM | POA: Diagnosis not present

## 2017-10-18 DIAGNOSIS — E669 Obesity, unspecified: Secondary | ICD-10-CM

## 2017-10-18 DIAGNOSIS — Z6831 Body mass index (BMI) 31.0-31.9, adult: Secondary | ICD-10-CM | POA: Diagnosis not present

## 2017-10-18 MED ORDER — LIRAGLUTIDE 18 MG/3ML ~~LOC~~ SOPN: PEN_INJECTOR | SUBCUTANEOUS | 0 refills | Status: DC

## 2017-10-18 NOTE — Progress Notes (Signed)
Office: [phone]  /  Fax: [phone]   HPI:   Chief Complaint: OBESITY Erin Roach is here to discuss her progress with her obesity treatment plan. She is on the keep a food journal with 1300 to 1500 calories and 90 grams of protein daily and is following her eating plan approximately 60 % of the time. She states she is exercising 0 minutes 0 times per week. Erin Roach continues to do well with weight loss, even with recent steroid injection. She notes increased polyphagia after steroids and had increased carbohydrates temporarily. Her weight is 188 lb (85.3 kg) today and has had a weight loss of 5 pounds over a period of 3 weeks since her last visit. She has lost 35 lbs since starting treatment with Erin Roach.  Diabetes II Erin Roach has a diagnosis of diabetes type II. She notes increased glucose levels after steroid injections, up to 219, but her levels are drifting back down to the 140's. Erin Roach is on Victoza and Metformin currently, and she is doing well overall.   Last A1c was at 5.5 She has been working on intensive lifestyle modifications including diet, exercise, and weight loss to help control her blood glucose levels.  ALLERGIES: No Known Allergies  MEDICATIONS: Current Outpatient Medications on File Prior to Visit  Medication Sig Dispense Refill  . Blood Glucose Monitoring Suppl (ONE TOUCH ULTRA 2) w/Device KIT USE TO TEST BLOOD SUGARS UP TO 4 TIMES DAILY 1 each 0  . Cholecalciferol (VITAMIN D) 2000 units tablet Take 1 tablet (2,000 Units total) by mouth daily.    . fluticasone (FLONASE) 50 MCG/ACT nasal spray Place 1 spray into both nostrils daily. 16 g 0  . furosemide (LASIX) 20 MG tablet TAKE ONE TABLET BY MOUTH DAILY 90 tablet 1  . glucose blood (ONE TOUCH ULTRA TEST) test strip USE AS INSTRUCTED TWICE DAILY 100 each 3  . HYDROcodone-acetaminophen (NORCO/VICODIN) 5-325 MG tablet Take 1 tablet by mouth at bedtime. 30 tablet 0  . Insulin Pen Needle (BD PEN NEEDLE NANO U/F) 32G X 4 MM MISC 1  Package by Does not apply route daily at 12 noon. 100 each 0  . losartan (COZAAR) 100 MG tablet TAKE ONE TABLET BY MOUTH DAILY 90 tablet 3  . metFORMIN (GLUCOPHAGE) 500 MG tablet TAKE ONE TABLET BY MOUTH TWICE A DAY WITH MEALS 60 tablet 0  . ONETOUCH DELICA LANCETS 03E MISC Use twice daily 100 each 3  . pramipexole (MIRAPEX) 0.25 MG tablet Take 0.5 mg by mouth 2 (two) times daily. 1630  And  Bedtime    . pregabalin (LYRICA) 75 MG capsule TAKE 1 CAPSULE BY MOUTH IN THE AFTERNOON AND TAKE 2 CAPSULES AT BEDTIME 270 capsule 0  . simvastatin (ZOCOR) 40 MG tablet TAKE ONE TABLET BY MOUTH AT BEDTIME 90 tablet 3  . VICTOZA 18 MG/3ML SOPN INJECT 1.2 MG TOTAL INTO THE SKIN EVERY MORNING. 3 pen 0  . zolpidem (AMBIEN) 5 MG tablet Take 1 tablet (5 mg total) by mouth at bedtime as needed for sleep. Half tab at bedtime 30 tablet 0  . [DISCONTINUED] VESICARE 10 MG tablet TAKE 1 TABLET EVERY DAY 30 tablet 0   No current facility-administered medications on file prior to visit.     PAST MEDICAL HISTORY: Past Medical History:  Diagnosis Date  . Allergic rhinitis   . Apnea   . Arthritis   . Asthma    very mild  . Back pain   . Bilateral lower extremity edema   .  Carotid artery disease ([place])    a. Carotid duplex 03/2016 - duplex was stable, 15-18% RICA, 3-43% LICA  . Complication of anesthesia    post-op delirium  . Endometrial polyp   . Essential hypertension   . GERD (gastroesophageal reflux disease)   . Heart murmur    per pt  . History of adenomatous polyp of colon    04-24-2016  tubular adenoma  . History of squamous cell carcinoma excision    pilonidal area second excision 02-02-2002  . HTN (hypertension)   . Hyperlipidemia   . Leg pain   . OAB (overactive bladder)   . RA (rheumatoid arthritis) ([place])   . RLS (restless legs syndrome)   . Shortness of breath on exertion   . SUI (stress urinary incontinence, female)   . Venous insufficiency    legs  . Vitamin D deficiency     PAST  SURGICAL HISTORY: Past Surgical History:  Procedure Laterality Date  . APPENDECTOMY  07-02-2005   [name]   open  . CARDIAC CATHETERIZATION  01-23-2009  dr Darnell Level brodie   normal coronary arteries and lvf  . CARDIOVASCULAR STRESS TEST  03/31/2016   Low risk nuclear study w/ medium defect of mild severity in basal anterior and mid anterior location w/ no evidence ischemia or infarction/  normal LV function and wall motion,  nuclear stress ef 71%  . CATARACT EXTRACTION W/ INTRAOCULAR LENS IMPLANT Left 09/2016  . COLONOSCOPY  last one 04-24-2016  . HYSTEROSCOPY W/D&C N/A 12/22/2016   Procedure: DILATATION AND CURETTAGE /HYSTEROSCOPY;  Surgeon: Dian Queen, MD;  Location: [facility];  Service: Gynecology;  Laterality: N/A;  . POSTERIOR LUMBAR FUSION  02/ 2018    [facility] ([place])  . SKIN CANCER EXCISION    . TONSILLECTOMY  child  . 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 TRANSTHORACIC ECHOCARDIOGRAM  01/21/2009   mild LVH, ef 73-57%, grade 1 diastolic dysfunction  . ULNAR NERVE TRANSPOSITION Left 12-20-2008  dr sypher   decompression and partial resection medial triceps fascia  . WIDE LOCAL EXCISION PILONIDAL AREA  02-02-2002  [name]   recurrent squamous cell carcinoma in situ    SOCIAL HISTORY: Social History   Tobacco Use  . Smoking status: Former Smoker    Packs/day: 0.50    Years: 10.00    Pack years: 5.00    Types: Cigarettes    Last attempt to quit: 12/10/1973    Years since quitting: 43.8  . Smokeless tobacco: Never Used  Substance Use Topics  . Alcohol use: Yes    Comment: occasional  . Drug use: No    FAMILY HISTORY: Family History  Problem Relation Age of Onset  . Esophageal cancer Father   . Heart disease Father        Unknown what kind  . Hyperlipidemia Father   . Hypertension Father    . Cancer Father   . Heart failure Father   . Hyperlipidemia Mother   . Hypertension Mother   . CAD Sister        33 years younger than patient - had stent. Was told that her anorexia/bulimia may have played a role  . Stroke Maternal Grandmother 66  . Diabetes Maternal Grandmother   . Heart attack Maternal Grandfather 93  . COPD Maternal Grandfather   . Heart attack Paternal Grandmother  45s  . Diabetes Unknown        MGGM    ROS: Review of Systems  Constitutional: Positive for weight loss.  Endo/Heme/Allergies:       Positive for hyperglycemia    PHYSICAL EXAM: Blood pressure 127/81, pulse 85, temperature 98.3 F (36.8 C), temperature source Oral, height '5\' 6"'  (1.676 m), weight 188 lb (85.3 kg), SpO2 96 %. Body mass index is 30.34 kg/m. Physical Exam  Constitutional: She is oriented to person, place, and time. She appears well-developed and well-nourished.  Cardiovascular: Normal rate.  Pulmonary/Chest: Effort normal.  Musculoskeletal: Normal range of motion.  Neurological: She is oriented to person, place, and time.  Skin: Skin is warm and dry.  Psychiatric: She has a normal mood and affect. Her behavior is normal.  Vitals reviewed.   RECENT LABS AND TESTS: BMET    Component Value Date/Time   NA 141 09/28/2017 0906   K 4.3 09/28/2017 0906   CL 105 09/28/2017 0906   CO2 19 (L) 09/28/2017 0906   GLUCOSE 94 09/28/2017 0906   GLUCOSE 147 (H) 12/08/2016 1436   BUN 15 09/28/2017 0906   CREATININE 0.67 09/28/2017 0906   CALCIUM 8.9 09/28/2017 0906   GFRNONAA 87 09/28/2017 0906   GFRAA 100 09/28/2017 0906   Lab Results  Component Value Date   HGBA1C 5.4 09/28/2017   HGBA1C 5.5 06/09/2017   HGBA1C 8.2 (H) 02/23/2017   HGBA1C 5.6 12/08/2016   HGBA1C 5.3 06/04/2016   Lab Results  Component Value Date   INSULIN 17.0 09/28/2017   INSULIN 28.3 (H) 06/09/2017   INSULIN 30.7 (H) 02/23/2017   CBC    Component Value Date/Time   WBC 6.2 09/28/2017 0906    WBC 6.8 12/15/2016 1159   RBC 4.07 09/28/2017 0906   RBC 4.23 12/15/2016 1159   HGB 11.8 09/28/2017 0906   HCT 35.4 09/28/2017 0906   PLT 242 12/15/2016 1159   MCV 87 09/28/2017 0906   MCH 29.0 09/28/2017 0906   MCH 30.3 12/15/2016 1159   MCHC 33.3 09/28/2017 0906   MCHC 34.8 12/15/2016 1159   RDW 14.3 09/28/2017 0906   LYMPHSABS 1.5 09/28/2017 0906   MONOABS 0.5 06/04/2016 0808   EOSABS 0.2 09/28/2017 0906   BASOSABS 0.0 09/28/2017 0906   Iron/TIBC/Ferritin/ %Sat    Component Value Date/Time   IRON 109 03/02/2017 1035   TIBC 333 03/19/2015 1448   FERRITIN 218.0 03/02/2017 1035   IRONPCTSAT 15 03/19/2015 1448   Lipid Panel     Component Value Date/Time   CHOL 173 09/28/2017 0906   TRIG 140 09/28/2017 0906   TRIG 101 05/19/2006 0832   HDL 50 09/28/2017 0906   CHOLHDL 4 06/04/2016 0808   VLDL 31.4 06/04/2016 0808   LDLCALC 95 09/28/2017 0906   LDLDIRECT 141.3 09/06/2012 0907   Hepatic Function Panel     Component Value Date/Time   PROT 6.1 09/28/2017 0906   ALBUMIN 4.0 09/28/2017 0906   AST 15 09/28/2017 0906   ALT 16 09/28/2017 0906   ALKPHOS 72 09/28/2017 0906   BILITOT 0.5 09/28/2017 0906   BILIDIR 0.1 12/04/2013 1724      Component Value Date/Time   TSH 4.050 09/28/2017 0906   TSH 4.810 (H) 02/23/2017 1007   TSH 4.22 06/04/2016 0808   Results for DARIANNE, MURALLES (MRN [id number]) as of 10/18/2017 16:32  Ref. Range 09/28/2017 09:06  Vitamin D, 25-Hydroxy Latest Ref Range: 30.0 - 100.0 ng/mL 34.5   ASSESSMENT AND PLAN: Type  2 diabetes mellitus without complication, without long-term current use of insulin ([place]) - Plan: liraglutide (VICTOZA) 18 MG/3ML SOPN  Class 1 obesity with serious comorbidity and body mass index (BMI) of 31.0 to 31.9 in adult, unspecified obesity type  PLAN:  Diabetes II Erin Roach has been given extensive diabetes education by myself today including ideal fasting and post-prandial blood glucose readings, individual ideal Hgb A1c goals  and hypoglycemia prevention. We discussed the importance of good blood sugar control to decrease the likelihood of diabetic complications such as nephropathy, neuropathy, limb loss, blindness, coronary artery disease, and death. We discussed the importance of intensive lifestyle modification including diet, exercise and weight loss as the first line treatment for diabetes. Erin Roach agrees to continue Victoza at 1.2 mg qd. We will refill for 1 month #2 pack. Erin Roach agrees to follow up at the agreed upon time.  Obesity Erin Roach is currently in the action stage of change. As such, her goal is to continue with weight loss efforts She has agreed to keep a food journal with 1300 to 1500 calories and 90 grams of protein daily Erin Roach has been instructed to work up to a goal of 150 minutes of combined cardio and strengthening exercise per week for weight loss and overall health benefits. We discussed the following Behavioral Modification Strategies today: increasing lean protein intake, decreasing simple carbohydrates , holiday eating strategies and travel eating strategies  Erin Roach has agreed to follow up with our clinic in 2 to 3 weeks. She was informed of the importance of frequent follow up visits to maximize her success with intensive lifestyle modifications for her multiple health conditions.   OBESITY BEHAVIORAL INTERVENTION VISIT  Today's visit was # 14 out of 22.  Starting weight: 223 lbs Starting date: 02/23/17 Today's weight : 188 lbs Today's date: 10/18/2017 Total lbs lost to date: 7 (Patients must lose 7 lbs in the first 6 months to continue with counseling)   ASK: We discussed the diagnosis of obesity with Erin Roach today and Erin Roach agreed to give Erin Roach permission to discuss obesity behavioral modification therapy today.  ASSESS: Erin Roach has the diagnosis of obesity and her BMI today is 30.36 Erin Roach is in the action stage of change   ADVISE: Erin Roach was educated on the multiple health risks  of obesity as well as the benefit of weight loss to improve her health. She was advised of the need for long term treatment and the importance of lifestyle modifications.  AGREE: Multiple dietary modification options and treatment options were discussed and  Erin Roach agreed to the above obesity treatment plan.  I, Doreene Nest, am acting as transcriptionist for Dennard Nip, MD  I have reviewed the above documentation for accuracy and completeness, and I agree with the above. -Dennard Nip, MD

## 2017-10-25 ENCOUNTER — Encounter (INDEPENDENT_AMBULATORY_CARE_PROVIDER_SITE_OTHER): Payer: Self-pay | Admitting: Family Medicine

## 2017-10-25 ENCOUNTER — Other Ambulatory Visit (INDEPENDENT_AMBULATORY_CARE_PROVIDER_SITE_OTHER): Payer: Self-pay

## 2017-10-25 DIAGNOSIS — E119 Type 2 diabetes mellitus without complications: Secondary | ICD-10-CM

## 2017-10-25 MED ORDER — INSULIN PEN NEEDLE 32G X 4 MM MISC: 1.0000 | Freq: Every day | 0 refills | Status: DC

## 2017-11-02 ENCOUNTER — Other Ambulatory Visit (INDEPENDENT_AMBULATORY_CARE_PROVIDER_SITE_OTHER): Payer: Self-pay | Admitting: Physician Assistant

## 2017-11-02 DIAGNOSIS — E119 Type 2 diabetes mellitus without complications: Secondary | ICD-10-CM

## 2017-11-03 ENCOUNTER — Other Ambulatory Visit (INDEPENDENT_AMBULATORY_CARE_PROVIDER_SITE_OTHER): Payer: Self-pay

## 2017-11-03 DIAGNOSIS — E119 Type 2 diabetes mellitus without complications: Secondary | ICD-10-CM

## 2017-11-03 MED ORDER — METFORMIN HCL 500 MG PO TABS: 500.0000 mg | ORAL_TABLET | Freq: Two times a day (BID) | ORAL | 0 refills | Status: DC

## 2017-11-05 ENCOUNTER — Encounter: Payer: Self-pay | Admitting: Internal Medicine

## 2017-11-05 MED ORDER — HYDROCODONE-ACETAMINOPHEN 5-325 MG PO TABS: 1.0000 | ORAL_TABLET | Freq: Every day | ORAL | 0 refills | Status: DC

## 2017-11-05 NOTE — Telephone Encounter (Signed)
[place] Controlled Substance Database checked. Last filled on 10/07/17 

## 2017-11-10 ENCOUNTER — Ambulatory Visit (INDEPENDENT_AMBULATORY_CARE_PROVIDER_SITE_OTHER): Payer: Medicare Other | Admitting: Family Medicine

## 2017-11-18 ENCOUNTER — Other Ambulatory Visit (INDEPENDENT_AMBULATORY_CARE_PROVIDER_SITE_OTHER): Payer: Self-pay | Admitting: Family Medicine

## 2017-11-18 DIAGNOSIS — E119 Type 2 diabetes mellitus without complications: Secondary | ICD-10-CM

## 2017-11-20 ENCOUNTER — Other Ambulatory Visit (INDEPENDENT_AMBULATORY_CARE_PROVIDER_SITE_OTHER): Payer: Self-pay | Admitting: Family Medicine

## 2017-11-20 DIAGNOSIS — E119 Type 2 diabetes mellitus without complications: Secondary | ICD-10-CM

## 2017-11-22 ENCOUNTER — Ambulatory Visit (INDEPENDENT_AMBULATORY_CARE_PROVIDER_SITE_OTHER): Payer: Medicare Other | Admitting: Family Medicine

## 2017-11-22 VITALS — BP 118/73 | HR 87 | Temp 98.3°F | Ht 66.0 in | Wt 192.0 lb

## 2017-11-22 DIAGNOSIS — E669 Obesity, unspecified: Secondary | ICD-10-CM

## 2017-11-22 DIAGNOSIS — E119 Type 2 diabetes mellitus without complications: Secondary | ICD-10-CM

## 2017-11-22 DIAGNOSIS — Z6831 Body mass index (BMI) 31.0-31.9, adult: Secondary | ICD-10-CM

## 2017-11-22 DIAGNOSIS — G4709 Other insomnia: Secondary | ICD-10-CM

## 2017-11-22 MED ORDER — LIRAGLUTIDE 18 MG/3ML ~~LOC~~ SOPN: PEN_INJECTOR | SUBCUTANEOUS | 0 refills | Status: DC

## 2017-11-22 MED ORDER — ZOLPIDEM TARTRATE 5 MG PO TABS: 5.0000 mg | ORAL_TABLET | Freq: Every evening | ORAL | 0 refills | Status: DC | PRN

## 2017-11-22 NOTE — Progress Notes (Signed)
Office: [phone]  /  Fax: [phone]   HPI:   Chief Complaint: OBESITY Erin Roach is here to discuss her progress with her obesity treatment plan. She is on the keep a food journal with 1300-1500 calories and 90 grams of protein daily and is following her eating plan approximately 25 % of the time. She states she is walking and gardening for 30-90 minutes 3-5 times per week. Erin Roach has been off track this summer and has increased traveling and eating out. Simple carbohydrates increased and lean protein and vegetable have decreased. She has 1 more large family trip this summer but will be ready to get back on track after that.  Her weight is 192 lb (87.1 kg) today and has gained 4 pounds since her last visit. She has lost 31 lbs since starting treatment with [name].  Insomnia Erin Roach has insomnia and she takes 1/2-1 tablet PO qhs as needed, goes through approximately 30 pilss in 3-4 months. She states it still is helping her sleep and rarely causes daytime somnolence.  Diabetes II Erin Roach has a diagnosis of diabetes type II. Erin Roach states fasting BGs mostly range in 130's, which is slightly elevated, likely due to celebration eating. Last A1c well controlled. She denies hypoglycemia. She has been working on intensive lifestyle modifications including diet, exercise, and weight loss to help control her blood glucose levels.  ALLERGIES: No Known Allergies  MEDICATIONS: Current Outpatient Medications on File Prior to Visit  Medication Sig Dispense Refill  . Blood Glucose Monitoring Suppl (ONE TOUCH ULTRA 2) w/Device KIT USE TO TEST BLOOD SUGARS UP TO 4 TIMES DAILY 1 each 0  . Cholecalciferol (VITAMIN D) 2000 units tablet Take 1 tablet (2,000 Units total) by mouth daily.    . fluticasone (FLONASE) 50 MCG/ACT nasal spray Place 1 spray into both nostrils daily. 16 g 0  . furosemide (LASIX) 20 MG tablet TAKE ONE TABLET BY MOUTH DAILY 90 tablet 1  . glucose blood (ONE TOUCH ULTRA TEST) test strip USE AS  INSTRUCTED TWICE DAILY 100 each 3  . HYDROcodone-acetaminophen (NORCO/VICODIN) 5-325 MG tablet Take 1 tablet by mouth at bedtime. 30 tablet 0  . Insulin Pen Needle (BD PEN NEEDLE NANO U/F) 32G X 4 MM MISC 1 Package by Does not apply route daily at 12 noon. 100 each 0  . liraglutide (VICTOZA) 18 MG/3ML SOPN INJECT 1.2 MG TOTAL INTO THE SKIN EVERY MORNING. 3 pen 0  . losartan (COZAAR) 100 MG tablet TAKE ONE TABLET BY MOUTH DAILY 90 tablet 3  . metFORMIN (GLUCOPHAGE) 500 MG tablet Take 1 tablet (500 mg total) by mouth 2 (two) times daily with a meal. 38 tablet 0  . ONETOUCH DELICA LANCETS 04U MISC Use twice daily 100 each 3  . pramipexole (MIRAPEX) 0.25 MG tablet Take 0.5 mg by mouth 2 (two) times daily. 1630  And  Bedtime    . pregabalin (LYRICA) 75 MG capsule TAKE 1 CAPSULE BY MOUTH IN THE AFTERNOON AND TAKE 2 CAPSULES AT BEDTIME 270 capsule 0  . simvastatin (ZOCOR) 40 MG tablet TAKE ONE TABLET BY MOUTH AT BEDTIME 90 tablet 3  . zolpidem (AMBIEN) 5 MG tablet Take 1 tablet (5 mg total) by mouth at bedtime as needed for sleep. Half tab at bedtime 30 tablet 0  . [DISCONTINUED] VESICARE 10 MG tablet TAKE 1 TABLET EVERY DAY 30 tablet 0   No current facility-administered medications on file prior to visit.     PAST MEDICAL HISTORY: Past Medical History:  Diagnosis  Date  . Allergic rhinitis   . Apnea   . Arthritis   . Asthma    very mild  . Back pain   . Bilateral lower extremity edema   . Carotid artery disease ([place])    a. Carotid duplex 03/2016 - duplex was stable, 71-69% RICA, 6-78% LICA  . Complication of anesthesia    post-op delirium  . Endometrial polyp   . Essential hypertension   . GERD (gastroesophageal reflux disease)   . Heart murmur    per pt  . History of adenomatous polyp of colon    04-24-2016  tubular adenoma  . History of squamous cell carcinoma excision    pilonidal area second excision 02-02-2002  . HTN (hypertension)   . Hyperlipidemia   . Leg pain   . OAB  (overactive bladder)   . RA (rheumatoid arthritis) ([place])   . RLS (restless legs syndrome)   . Shortness of breath on exertion   . SUI (stress urinary incontinence, female)   . Venous insufficiency    legs  . Vitamin D deficiency     PAST SURGICAL HISTORY: Past Surgical History:  Procedure Laterality Date  . APPENDECTOMY  07-02-2005   [name]   open  . CARDIAC CATHETERIZATION  01-23-2009  dr Darnell Level brodie   normal coronary arteries and lvf  . CARDIOVASCULAR STRESS TEST  03/31/2016   Low risk nuclear study w/ medium defect of mild severity in basal anterior and mid anterior location w/ no evidence ischemia or infarction/  normal LV function and wall motion,  nuclear stress ef 71%  . CATARACT EXTRACTION W/ INTRAOCULAR LENS IMPLANT Left 09/2016  . COLONOSCOPY  last one 04-24-2016  . HYSTEROSCOPY W/D&C N/A 12/22/2016   Procedure: DILATATION AND CURETTAGE /HYSTEROSCOPY;  Surgeon: Dian Queen, MD;  [facility]: [facility];  Service: Gynecology;  Laterality: N/A;  . POSTERIOR LUMBAR FUSION  02/ 2018    [facility] ([place], [place])  . SKIN CANCER EXCISION    . TONSILLECTOMY  child  . 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 TRANSTHORACIC ECHOCARDIOGRAM  01/21/2009   mild LVH, ef 93-81%, grade 1 diastolic dysfunction  . ULNAR NERVE TRANSPOSITION Left 12-20-2008  dr sypher   decompression and partial resection medial triceps fascia  . WIDE LOCAL EXCISION PILONIDAL AREA  02-02-2002  [name]   recurrent squamous cell carcinoma in situ    SOCIAL HISTORY: Social History   Tobacco Use  . Smoking status: Former Smoker    Packs/day: 0.50    Years: 10.00    Pack years: 5.00    Types: Cigarettes    Last attempt to quit: 12/10/1973    Years since quitting: 43.9  . Smokeless tobacco: Never Used  Substance Use Topics  . Alcohol use:  Yes    Comment: occasional  . Drug use: No    FAMILY HISTORY: Family History  Problem Relation Age of Onset  . Esophageal cancer Father   . Heart disease Father        Unknown what kind  . Hyperlipidemia Father   . Hypertension Father   . Cancer Father   . Heart failure Father   . Hyperlipidemia Mother   . Hypertension Mother   . CAD Sister        45 years younger than patient - had stent. Was told that her anorexia/bulimia may  have played a role  . Stroke Maternal Grandmother 66  . Diabetes Maternal Grandmother   . Heart attack Maternal Grandfather 93  . COPD Maternal Grandfather   . Heart attack Paternal Grandmother        44s  . Diabetes Unknown        MGGM    ROS: Review of Systems  Constitutional: Negative for weight loss.  Endo/Heme/Allergies:       Negative hypoglycemia  Psychiatric/Behavioral: The patient has insomnia.     PHYSICAL EXAM: Blood pressure 118/73, pulse 87, temperature 98.3 F (36.8 C), temperature source Oral, height _0  (1.676 m), weight 192 lb (87.1 kg), SpO2 97 %. Body mass index is 30.99 kg/m. Physical Exam  Constitutional: She is oriented to person, place, and time. She appears well-developed and well-nourished.  Cardiovascular: Normal rate.  Pulmonary/Chest: Effort normal.  Musculoskeletal: Normal range of motion.  Neurological: She is oriented to person, place, and time.  Skin: Skin is warm and dry.  Psychiatric: She has a normal mood and affect. Her behavior is normal.  Vitals reviewed.   RECENT LABS AND TESTS: BMET    Component Value Date/Time   NA 141 09/28/2017 0906   K 4.3 09/28/2017 0906   CL 105 09/28/2017 0906   CO2 19 (L) 09/28/2017 0906   GLUCOSE 94 09/28/2017 0906   GLUCOSE 147 (H) 12/08/2016 1436   BUN 15 09/28/2017 0906   CREATININE 0.67 09/28/2017 0906   CALCIUM 8.9 09/28/2017 0906   GFRNONAA 87 09/28/2017 0906   GFRAA 100 09/28/2017 0906   Lab Results  Component Value Date   HGBA1C 5.4 09/28/2017    HGBA1C 5.5 06/09/2017   HGBA1C 8.2 (H) 02/23/2017   HGBA1C 5.6 12/08/2016   HGBA1C 5.3 06/04/2016   Lab Results  Component Value Date   INSULIN 17.0 09/28/2017   INSULIN 28.3 (H) 06/09/2017   INSULIN 30.7 (H) 02/23/2017   CBC    Component Value Date/Time   WBC 6.2 09/28/2017 0906   WBC 6.8 12/15/2016 1159   RBC 4.07 09/28/2017 0906   RBC 4.23 12/15/2016 1159   HGB 11.8 09/28/2017 0906   HCT 35.4 09/28/2017 0906   PLT 242 12/15/2016 1159   MCV 87 09/28/2017 0906   MCH 29.0 09/28/2017 0906   MCH 30.3 12/15/2016 1159   MCHC 33.3 09/28/2017 0906   MCHC 34.8 12/15/2016 1159   RDW 14.3 09/28/2017 0906   LYMPHSABS 1.5 09/28/2017 0906   MONOABS 0.5 06/04/2016 0808   EOSABS 0.2 09/28/2017 0906   BASOSABS 0.0 09/28/2017 0906   Iron/TIBC/Ferritin/ %Sat    Component Value Date/Time   IRON 109 03/02/2017 1035   TIBC 333 03/19/2015 1448   FERRITIN 218.0 03/02/2017 1035   IRONPCTSAT 15 03/19/2015 1448   Lipid Panel     Component Value Date/Time   CHOL 173 09/28/2017 0906   TRIG 140 09/28/2017 0906   TRIG 101 05/19/2006 0832   HDL 50 09/28/2017 0906   CHOLHDL 4 06/04/2016 0808   VLDL 31.4 06/04/2016 0808   LDLCALC 95 09/28/2017 0906   LDLDIRECT 141.3 09/06/2012 0907   Hepatic Function Panel     Component Value Date/Time   PROT 6.1 09/28/2017 0906   ALBUMIN 4.0 09/28/2017 0906   AST 15 09/28/2017 0906   ALT 16 09/28/2017 0906   ALKPHOS 72 09/28/2017 0906   BILITOT 0.5 09/28/2017 0906   BILIDIR 0.1 12/04/2013 1724      Component Value Date/Time   TSH 4.050 09/28/2017 3893  TSH 4.810 (H) 02/23/2017 1007   TSH 4.22 06/04/2016 0808    ASSESSMENT AND PLAN: Other insomnia - Plan: zolpidem (AMBIEN) 5 MG tablet  Type 2 diabetes mellitus without complication, without long-term current use of insulin (HCC) - Plan: liraglutide (VICTOZA) 18 MG/3ML SOPN  Class 1 obesity with serious comorbidity and body mass index (BMI) of 31.0 to 31.9 in adult, unspecified obesity  type  PLAN:  Insomnia Erin Roach agrees to continue taking Ambien 5 mg 1 tablet PO qhs #30 and we will refill for 1 month, no print. Erin Roach agrees to follow up with our clinic in 3 weeks.   Diabetes II Erin Roach has been given extensive diabetes education by myself today including ideal fasting and post-prandial blood glucose readings, individual ideal Hgb A1c goals and hypoglycemia prevention. We discussed the importance of good blood sugar control to decrease the likelihood of diabetic complications such as nephropathy, neuropathy, limb loss, blindness, coronary artery disease, and death. We discussed the importance of intensive lifestyle modification including diet, exercise and weight loss as the first line treatment for diabetes. Erin Roach agrees to continue Victoza 1.2 mg qd #3 pens and we will refill for 1 month and she agrees to continue taking metformin. Erin Roach agrees to follow up with our clinic in 3 weeks.  Obesity Erin Roach is currently in the action stage of change. As such, her goal is to maintain weight for now, goal is to maintain weight over vacation She has agreed to keep a food journal with 1200-1400 calories and 80+ grams of protein daily Erin Roach has been instructed to work up to a goal of 150 minutes of combined cardio and strengthening exercise per week for weight loss and overall health benefits. We discussed the following Behavioral Modification Strategies today: increasing lean protein intake, decreasing simple carbohydrates, work on meal planning and easy cooking plans, travel eating strategies, and celebration eating strategies   Erin Roach has agreed to follow up with our clinic in 3 weeks. She was informed of the importance of frequent follow up visits to maximize her success with intensive lifestyle modifications for her multiple health conditions.   OBESITY BEHAVIORAL INTERVENTION VISIT  Today's visit was # 15 out of 22.  Starting weight: 223 lbs Starting date: 02/23/17 Today's weight  : 192 lbs  Today's date: 11/22/2017 Total lbs lost to date: 77 (Patients must lose 7 lbs in the first 6 months to continue with counseling)   ASK: We discussed the diagnosis of obesity with Erin Roach today and Erin Roach agreed to give [name] permission to discuss obesity behavioral modification therapy today.  ASSESS: Erin Roach has the diagnosis of obesity and her BMI today is 59 Erin Roach is in the action stage of change   ADVISE: Erin Roach was educated on the multiple health risks of obesity as well as the benefit of weight loss to improve her health. She was advised of the need for long term treatment and the importance of lifestyle modifications.  AGREE: Multiple dietary modification options and treatment options were discussed and  Erin Roach agreed to the above obesity treatment plan.  I, Trixie Dredge, am acting as transcriptionist for Dennard Nip, MD  I have reviewed the above documentation for accuracy and completeness, and I agree with the above. -Dennard Nip, MD

## 2017-11-29 ENCOUNTER — Encounter: Payer: Self-pay | Admitting: *Deleted

## 2017-12-07 ENCOUNTER — Other Ambulatory Visit: Payer: Self-pay | Admitting: Internal Medicine

## 2017-12-07 ENCOUNTER — Encounter: Payer: Self-pay | Admitting: Internal Medicine

## 2017-12-07 NOTE — Telephone Encounter (Signed)
Check [place] registry last filled 11/05/2017../lmb  

## 2017-12-08 MED ORDER — HYDROCODONE-ACETAMINOPHEN 5-325 MG PO TABS: 1.0000 | ORAL_TABLET | Freq: Every day | ORAL | 0 refills | Status: DC

## 2017-12-08 NOTE — Telephone Encounter (Signed)
[place] Controlled Substance Database checked. Last filled on 09/16/17 85 day supply

## 2017-12-08 NOTE — Telephone Encounter (Signed)
rx sent into [facility] initially and then resent to correct pharmacy.  advised pt it was sent.    Can you please call [facility] and cancel rx -- thanks!

## 2017-12-08 NOTE — Addendum Note (Signed)
Addended by: Binnie Rail on: 12/08/2017 09:12 PM   Modules accepted: Orders

## 2017-12-11 ENCOUNTER — Other Ambulatory Visit (INDEPENDENT_AMBULATORY_CARE_PROVIDER_SITE_OTHER): Payer: Self-pay | Admitting: Family Medicine

## 2017-12-11 DIAGNOSIS — E119 Type 2 diabetes mellitus without complications: Secondary | ICD-10-CM

## 2017-12-12 ENCOUNTER — Encounter (HOSPITAL_COMMUNITY): Payer: Self-pay | Admitting: Emergency Medicine

## 2017-12-12 ENCOUNTER — Other Ambulatory Visit: Payer: Self-pay

## 2017-12-12 ENCOUNTER — Emergency Department (HOSPITAL_COMMUNITY)
Admission: EM | Admit: 2017-12-12 | Discharge: 2017-12-12 | Disposition: A | Discharge status: Home / Self Care | Payer: Medicare Other | Attending: Emergency Medicine | Admitting: Emergency Medicine

## 2017-12-12 ENCOUNTER — Other Ambulatory Visit (INDEPENDENT_AMBULATORY_CARE_PROVIDER_SITE_OTHER): Payer: Self-pay | Admitting: Family Medicine

## 2017-12-12 DIAGNOSIS — J45909 Unspecified asthma, uncomplicated: Secondary | ICD-10-CM | POA: Insufficient documentation

## 2017-12-12 DIAGNOSIS — Y9389 Activity, other specified: Secondary | ICD-10-CM | POA: Diagnosis not present

## 2017-12-12 DIAGNOSIS — E119 Type 2 diabetes mellitus without complications: Secondary | ICD-10-CM | POA: Diagnosis not present

## 2017-12-12 DIAGNOSIS — Z7984 Long term (current) use of oral hypoglycemic drugs: Secondary | ICD-10-CM | POA: Diagnosis not present

## 2017-12-12 DIAGNOSIS — I10 Essential (primary) hypertension: Secondary | ICD-10-CM | POA: Insufficient documentation

## 2017-12-12 DIAGNOSIS — R2231 Localized swelling, mass and lump, right upper limb: Secondary | ICD-10-CM | POA: Insufficient documentation

## 2017-12-12 DIAGNOSIS — Z87891 Personal history of nicotine dependence: Secondary | ICD-10-CM | POA: Insufficient documentation

## 2017-12-12 DIAGNOSIS — T63441A Toxic effect of venom of bees, accidental (unintentional), initial encounter: Secondary | ICD-10-CM | POA: Diagnosis not present

## 2017-12-12 DIAGNOSIS — S6991XA Unspecified injury of right wrist, hand and finger(s), initial encounter: Secondary | ICD-10-CM | POA: Diagnosis present

## 2017-12-12 DIAGNOSIS — Z96652 Presence of left artificial knee joint: Secondary | ICD-10-CM | POA: Insufficient documentation

## 2017-12-12 DIAGNOSIS — Z85828 Personal history of other malignant neoplasm of skin: Secondary | ICD-10-CM | POA: Insufficient documentation

## 2017-12-12 DIAGNOSIS — Y929 Unspecified place or not applicable: Secondary | ICD-10-CM | POA: Insufficient documentation

## 2017-12-12 DIAGNOSIS — W4904XA Ring or other jewelry causing external constriction, initial encounter: Secondary | ICD-10-CM | POA: Insufficient documentation

## 2017-12-12 DIAGNOSIS — Z79899 Other long term (current) drug therapy: Secondary | ICD-10-CM | POA: Insufficient documentation

## 2017-12-12 DIAGNOSIS — Y998 Other external cause status: Secondary | ICD-10-CM | POA: Diagnosis not present

## 2017-12-12 DIAGNOSIS — S60449A External constriction of unspecified finger, initial encounter: Secondary | ICD-10-CM | POA: Diagnosis not present

## 2017-12-12 DIAGNOSIS — X58XXXA Exposure to other specified factors, initial encounter: Secondary | ICD-10-CM | POA: Diagnosis not present

## 2017-12-12 NOTE — ED Provider Notes (Signed)
[facility] DEPT Provider Note   CSN: [id number] Arrival date & time: 12/12/17  2019     History   Chief Complaint Chief Complaint  Patient presents with  . Insect Bite    HPI Erin Roach is a 77 y.o. female who presents today for evaluation.  She reports that yesterday she was stung by 3 bees on her right hand, primarily on her right ring finger.  She did not remove the ring and overnight the digit became swollen.  She is unable to remove the ring at this time.  She reports that the finger aches and she has limited flexion secondary to swelling.  Denies fevers, body wide rashes, shortness of breath, nausea vomiting or diarrhea, has never had anaphylactic reactions to wasps in the past.  HPI  Past Medical History:  Diagnosis Date  . Allergic rhinitis   . Apnea   . Arthritis   . Asthma    very mild  . Back pain   . Bilateral lower extremity edema   . Carotid artery disease ([place])    a. Carotid duplex 03/2016 - duplex was stable, 18-84% RICA, 1-66% LICA  . Complication of anesthesia    post-op delirium  . Endometrial polyp   . Essential hypertension   . GERD (gastroesophageal reflux disease)   . Heart murmur    per pt  . History of adenomatous polyp of colon    04-24-2016  tubular adenoma  . History of squamous cell carcinoma excision    pilonidal area second excision 02-02-2002  . HTN (hypertension)   . Hyperlipidemia   . Leg pain   . OAB (overactive bladder)   . RA (rheumatoid arthritis) ([place])   . RLS (restless legs syndrome)   . Shortness of breath on exertion   . SUI (stress urinary incontinence, female)   . Venous insufficiency    legs  . Vitamin D deficiency     Patient Active Problem List   Diagnosis Date Noted  . OSA on CPAP 06/15/2017  . Diabetes ([place]) 03/02/2017  . Herpes zoster without complication 11/08/1599  . Shortness of breath on exertion 02/23/2017  . B12 nutritional deficiency 02/23/2017  . Vitamin D  deficiency 12/08/2016  . Tubular adenoma of colon 04/29/2016  . Essential hypertension 09/12/2015  . Allergic rhinitis 07/17/2015  . Insomnia 07/17/2015  . Obese 04/10/2015  . S/P left TKA 04/09/2015  . Osteopenia 12/04/2012  . 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 RESTLESS LEG SYNDROME 02/11/2009  . LEG EDEMA, BILATERAL 02/11/2009  . Hyperlipidemia 10/30/2008  . Urinary incontinence 10/30/2008  . Skin cancer 10/30/2008  . Asthma 11/21/2007  . Venous (peripheral) insufficiency 11/22/2006  . SNORING 11/22/2006    Past Surgical History:  Procedure Laterality Date  . APPENDECTOMY  07-02-2005   [name]   open  . CARDIAC CATHETERIZATION  01-23-2009  dr Darnell Level brodie   normal coronary arteries and lvf  . CARDIOVASCULAR STRESS TEST  03/31/2016   Low risk nuclear study w/ medium defect of mild severity in basal anterior and mid anterior location w/ no evidence ischemia or infarction/  normal LV function and wall motion,  nuclear stress ef 71%  . CATARACT EXTRACTION W/ INTRAOCULAR LENS IMPLANT Left 09/2016  . COLONOSCOPY  last one 04-24-2016  . HYSTEROSCOPY W/D&C N/A 12/22/2016   Procedure: DILATATION AND CURETTAGE /HYSTEROSCOPY;  Surgeon: Dian Queen, MD;  Location: [facility];  Service: Gynecology;  Laterality: N/A;  . IR US GUIDANCE  09/08/2016  .  POSTERIOR LUMBAR FUSION  02/ 2018    [facility] ([place])  . SKIN CANCER EXCISION    . TONSILLECTOMY  child  . 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 TRANSTHORACIC ECHOCARDIOGRAM  01/21/2009   mild LVH, ef 95-62%, grade 1 diastolic dysfunction  . ULNAR NERVE TRANSPOSITION Left 12-20-2008  dr sypher   decompression and partial resection medial triceps fascia  . WIDE LOCAL EXCISION PILONIDAL AREA  02-02-2002  [name]   recurrent squamous cell carcinoma in situ     OB  History    Gravida  2   Para  2   Term  2   Preterm      AB      Living  2     SAB      TAB      Ectopic      Multiple      Live Births  2            Home Medications    Prior to Admission medications   Medication Sig Start Date End Date Taking? Authorizing Provider  Blood Glucose Monitoring Suppl (ONE TOUCH ULTRA 2) w/Device KIT USE TO TEST BLOOD SUGARS UP TO 4 TIMES DAILY 03/30/17   Binnie Rail, MD  Cholecalciferol (VITAMIN D) 2000 units tablet Take 1 tablet (2,000 Units total) by mouth daily. 03/09/17   Dennard Nip D, MD  fluticasone (FLONASE) 50 MCG/ACT nasal spray Place 1 spray into both nostrils daily. 09/28/17   Waldon Merl, PA-C  furosemide (LASIX) 20 MG tablet TAKE ONE TABLET BY MOUTH DAILY 10/11/17   Binnie Rail, MD  glucose blood (ONE TOUCH ULTRA TEST) test strip USE AS INSTRUCTED TWICE DAILY 08/12/17   Dennard Nip D, MD  HYDROcodone-acetaminophen (NORCO/VICODIN) 5-325 MG tablet Take 1 tablet by mouth at bedtime. 12/08/17   Binnie Rail, MD  Insulin Pen Needle (BD PEN NEEDLE NANO U/F) 32G X 4 MM MISC 1 Package by Does not apply route daily at 12 noon. 10/25/17   Dennard Nip D, MD  liraglutide (VICTOZA) 18 MG/3ML SOPN INJECT 1.2 MG TOTAL INTO THE SKIN EVERY MORNING. 11/22/17   Dennard Nip D, MD  losartan (COZAAR) 100 MG tablet TAKE ONE TABLET BY MOUTH DAILY 08/03/17   Binnie Rail, MD  metFORMIN (GLUCOPHAGE) 500 MG tablet TAKE ONE TABLET BY MOUTH TWICE A DAY WITH A MEAL 11/25/17   Dennard Nip D, MD  [place] DELICA LANCETS 13Y MISC Use twice daily 08/12/17   Dennard Nip D, MD  pramipexole (MIRAPEX) 0.25 MG tablet Take 0.5 mg by mouth 2 (two) times daily. 1630  And  Bedtime    [provider]  pregabalin (LYRICA) 75 MG capsule TAKE ONE CAPSULE BY MOUTH EVERY AFTERNOON AND TAKE TWO CAPSULES BY MOUTH EVERY NIGHT AT BEDTIME 12/08/17   Burns, Claudina Lick, MD  simvastatin (ZOCOR) 40 MG tablet TAKE ONE TABLET BY MOUTH AT BEDTIME 08/03/17    Burns, Claudina Lick, MD  zolpidem (AMBIEN) 5 MG tablet Take 1 tablet (5 mg total) by mouth at bedtime as needed for sleep. Half tab at bedtime 11/22/17   Dennard Nip D, MD  VESICARE 10 MG tablet TAKE 1 TABLET EVERY DAY 02/16/12 04/04/12  Hendricks Limes, MD    Family History Family History  Problem Relation Age of Onset  . Esophageal cancer Father   .  Heart disease Father        Unknown what kind  . Hyperlipidemia Father   . Hypertension Father   . Cancer Father   . Heart failure Father   . Hyperlipidemia Mother   . Hypertension Mother   . CAD Sister        55 years younger than patient - had stent. Was told that her anorexia/bulimia may have played a role  . Stroke Maternal Grandmother 66  . Diabetes Maternal Grandmother   . Heart attack Maternal Grandfather 93  . COPD Maternal Grandfather   . Heart attack Paternal Grandmother        88s  . Diabetes Unknown        MGGM    Social History Social History   Tobacco Use  . Smoking status: Former Smoker    Packs/day: 0.50    Years: 10.00    Pack years: 5.00    Types: Cigarettes    Last attempt to quit: 12/10/1973    Years since quitting: 44.0  . Smokeless tobacco: Never Used  Substance Use Topics  . Alcohol use: Yes    Comment: occasional  . Drug use: No     Allergies   Patient has no known allergies.   Review of Systems Review of Systems  Constitutional: Negative for chills and fever.  Respiratory: Negative for cough, chest tightness and shortness of breath.   Musculoskeletal:       Swelling in right index finger.  Skin: Negative for rash.  All other systems reviewed and are negative.    Physical Exam Updated Vital Signs BP (!) 151/77 (BP Location: Left Arm)   Pulse 93   Temp 98.3 F (36.8 C) (Oral)   Resp 18   Ht _0  (1.676 m)   Wt 86.2 kg (190 lb)   SpO2 98%   BMI 30.67 kg/m   Physical Exam  Constitutional: She is oriented to person, place, and time. She appears well-developed. No distress.    HENT:  Head: Normocephalic and atraumatic.  Cardiovascular: Normal rate and intact distal pulses.  Brisk capillary refill of right index finger.  Pulmonary/Chest: No respiratory distress.  Musculoskeletal:  There is a thick silver-colored ring on the right index finger.  Distal to this ring the finger is red, swollen, proximal to the ring skin appears normal.  There are 2-3 small punctate wounds on the distal ring finger, no obvious retained foreign bodies or stinger.  Neurological: She is alert and oriented to person, place, and time.  Sensation intact to right ring finger.  Skin: Skin is warm and dry. She is not diaphoretic.  Nursing note and vitals reviewed.       After removal        ED Treatments / Results  Labs (all labs ordered are listed, but only abnormal results are displayed) Labs Reviewed - No data to display  EKG None  Radiology No results found.  Procedures .Foreign Body Removal Date/Time: 12/12/2017 10:17 PM Performed by: Lorin Glass, PA-C Authorized by: Lorin Glass, PA-C  Consent: Verbal consent obtained. Risks and benefits: risks, benefits and alternatives were discussed Consent given by: patient Patient identity confirmed: verbally with patient Body area: skin General location: upper extremity Location details: right ring finger Complexity: complex 1 objects recovered. Post-procedure assessment: foreign body removed Patient tolerance: Patient tolerated the procedure well with no immediate complications Comments: Patient had a thick silver-colored ring on her right index finger providing a ring tourniquet which required emergent  removal to preserve the right index finger.  Initially RN attempted removal with hand tools, however this was not effective.  In order to save the digit I used power ring remover which took approximately 30 minutes as the ring had to be cut on both the dorsal and ventral aspects.  Ring was eventually  removed, there is a small abrasion on the finger without lacerations.  The area is generally red without drainage.   (including critical care time)  Medications Ordered in ED Medications - No data to display   Initial Impression / Assessment and Plan / ED Course  I have reviewed the triage vital signs and the nursing notes.  Pertinent labs & imaging results that were available during my care of the patient were reviewed by me and considered in my medical decision making (see chart for details).    Patient presents today for evaluation of bee stings yesterday on her right index finger.  She left a ring present on there overnight which caused swelling and a tourniquet effect.  She had brisk capillary refill to the right ring finger, however the digit itself was significantly swollen requiring emergent ring removal to preserve the finger.  Ring was removed with significant difficulty due to the thickness of the ring.  Afterwards patient was observed with improvement in her pain and swelling.  She has an appointment with her PCP in 2 days for her annual physical and will mention this to them then.  Tdap is up-to-date.  Return precautions were discussed with patient who states their understanding.  At the time of discharge patient denied any unaddressed complaints or concerns.  Patient is agreeable for discharge home.   Final Clinical Impressions(s) / ED Diagnoses   Final diagnoses:  Bee sting, accidental or unintentional, initial encounter  Constriction injury of finger, initial encounter    ED Discharge Orders    None       Ollen Gross 12/12/17 2225    Duffy Bruce, MD 12/14/17 [id number]

## 2017-12-12 NOTE — ED Triage Notes (Signed)
Patient was stung by a three bees on Saturday on the right hand. Patient hand is swollen. Ring is stuck on finger.

## 2017-12-12 NOTE — Discharge Instructions (Signed)
Please take Ibuprofen (Advil, motrin) and Tylenol (acetaminophen) to relieve your pain.  You may take up to 600 MG (3 pills) of normal strength ibuprofen every 8 hours as needed.  In between doses of ibuprofen you make take tylenol, up to 1,000 mg (two extra strength pills).  Do not take more than 3,000 mg tylenol in a 24 hour period.  Please check all medication labels as many medications such as pain and cold medications may contain tylenol.  Do not drink alcohol while taking these medications.  Do not take other NSAID'S while taking ibuprofen (such as aleve or naproxen).  Please take ibuprofen with food to decrease stomach upset.  Please keep your appointment with your primary care provider.  Please keep the area where your ring was in your small abrasion clean and dry, you may put antibiotic ointment and a bandage on it.  The swelling should go down over the next day or 2.  You may develop bruising in that finger which is normal.  If that finger becomes numb, the swelling does not improve, your pain worsens, or you have additional concerns please seek additional medical care and evaluation.

## 2017-12-12 NOTE — ED Notes (Signed)
Ring successfully removed from patient. Skin underneath washed and dried. Swelling has decreased. Pt denies numbness in finger and has full ROM.

## 2017-12-14 ENCOUNTER — Encounter: Payer: Self-pay | Admitting: Internal Medicine

## 2017-12-14 ENCOUNTER — Ambulatory Visit: Payer: Medicare Other | Admitting: Internal Medicine

## 2017-12-14 VITALS — BP 138/74 | HR 109 | Temp 98.4°F | Resp 16 | Wt 194.0 lb

## 2017-12-14 DIAGNOSIS — I10 Essential (primary) hypertension: Secondary | ICD-10-CM

## 2017-12-14 DIAGNOSIS — E7849 Other hyperlipidemia: Secondary | ICD-10-CM

## 2017-12-14 DIAGNOSIS — G2581 Restless legs syndrome: Secondary | ICD-10-CM | POA: Diagnosis not present

## 2017-12-14 DIAGNOSIS — E119 Type 2 diabetes mellitus without complications: Secondary | ICD-10-CM | POA: Diagnosis not present

## 2017-12-14 NOTE — Progress Notes (Signed)
Subjective:    Patient ID: Erin Roach, female    DOB: Jan 31, 1941, 77 y.o.   MRN: [id number]  HPI The patient is here for follow up.  Hypertension: She is taking her medication daily. She is compliant with a low sodium diet.  She denies chest pain, palpitations, edema, shortness of breath and regular headaches. She is exercising regularly.  She does not monitor her blood pressure at home.    Diabetes: She is taking her medication daily as prescribed. She is compliant with a diabetic diet. She is exercising regularly.  She checks her feet daily and denies foot lesions. She is up-to-date with an ophthalmology examination.   Hyperlipidemia: She is taking her medication daily. She is compliant with a low fat/cholesterol diet. She is exercising regularly. She denies myalgias.   Restless leg syndrome: She is taking hydrocodone nightly.  She is also taking Mirapex and Lyrica.  She uses her CPAP on a nightly basis.  She sees a new RLS specialist in one month in [place].   She is up and down during the night.     Medications and allergies reviewed with patient and updated if appropriate.  Patient Active Problem List   Diagnosis Date Noted  . OSA on CPAP 06/15/2017  . Diabetes ([place]) 03/02/2017  . Herpes zoster without complication 15/94/5859  . Shortness of breath on exertion 02/23/2017  . B12 nutritional deficiency 02/23/2017  . Vitamin D deficiency 12/08/2016  . Tubular adenoma of colon 04/29/2016  . Essential hypertension 09/12/2015  . Allergic rhinitis 07/17/2015  . Insomnia 07/17/2015  . Obese 04/10/2015  . S/P left TKA 04/09/2015  . Osteopenia 12/04/2012  . 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 RESTLESS LEG SYNDROME 02/11/2009  . LEG EDEMA, BILATERAL 02/11/2009  . Hyperlipidemia 10/30/2008  . Urinary incontinence 10/30/2008  . Skin cancer 10/30/2008  . Asthma 11/21/2007  . Venous (peripheral) insufficiency 11/22/2006  . SNORING 11/22/2006    Current Outpatient Medications on  File Prior to Visit  Medication Sig Dispense Refill  . Blood Glucose Monitoring Suppl (ONE TOUCH ULTRA 2) w/Device KIT USE TO TEST BLOOD SUGARS UP TO 4 TIMES DAILY 1 each 0  . Cholecalciferol (VITAMIN D) 2000 units tablet Take 1 tablet (2,000 Units total) by mouth daily.    . fluticasone (FLONASE) 50 MCG/ACT nasal spray Place 1 spray into both nostrils daily. 16 g 0  . furosemide (LASIX) 20 MG tablet TAKE ONE TABLET BY MOUTH DAILY 90 tablet 1  . glucose blood (ONE TOUCH ULTRA TEST) test strip USE AS INSTRUCTED TWICE DAILY 100 each 3  . HYDROcodone-acetaminophen (NORCO/VICODIN) 5-325 MG tablet Take 1 tablet by mouth at bedtime. 30 tablet 0  . Insulin Pen Needle (BD PEN NEEDLE NANO U/F) 32G X 4 MM MISC 1 Package by Does not apply route daily at 12 noon. 100 each 0  . liraglutide (VICTOZA) 18 MG/3ML SOPN INJECT 1.2 MG TOTAL INTO THE SKIN EVERY MORNING. 3 pen 0  . losartan (COZAAR) 100 MG tablet TAKE ONE TABLET BY MOUTH DAILY 90 tablet 3  . metFORMIN (GLUCOPHAGE) 500 MG tablet TAKE ONE TABLET BY MOUTH TWICE A DAY WITH A MEAL 40 tablet 0  . [place] DELICA LANCETS 29W MISC Use twice daily 100 each 3  . pramipexole (MIRAPEX) 0.25 MG tablet Take 0.5 mg by mouth 2 (two) times daily. 1630  And  Bedtime    . pregabalin (LYRICA) 75 MG capsule TAKE ONE CAPSULE BY MOUTH EVERY AFTERNOON AND TAKE TWO CAPSULES BY  MOUTH EVERY NIGHT AT BEDTIME 270 capsule 0  . simvastatin (ZOCOR) 40 MG tablet TAKE ONE TABLET BY MOUTH AT BEDTIME 90 tablet 3  . [DISCONTINUED] VESICARE 10 MG tablet TAKE 1 TABLET EVERY DAY 30 tablet 0   No current facility-administered medications on file prior to visit.     Past Medical History:  Diagnosis Date  . Allergic rhinitis   . Apnea   . Arthritis   . Asthma    very mild  . Back pain   . Bilateral lower extremity edema   . Carotid artery disease ([place])    a. Carotid duplex 03/2016 - duplex was stable, 09-60% RICA, 4-54% LICA  . Complication of anesthesia    post-op delirium  .  Endometrial polyp   . Essential hypertension   . GERD (gastroesophageal reflux disease)   . Heart murmur    per pt  . History of adenomatous polyp of colon    04-24-2016  tubular adenoma  . History of squamous cell carcinoma excision    pilonidal area second excision 02-02-2002  . HTN (hypertension)   . Hyperlipidemia   . Leg pain   . OAB (overactive bladder)   . RA (rheumatoid arthritis) ([place])   . RLS (restless legs syndrome)   . Shortness of breath on exertion   . SUI (stress urinary incontinence, female)   . Venous insufficiency    legs  . Vitamin D deficiency     Past Surgical History:  Procedure Laterality Date  . APPENDECTOMY  07-02-2005   [name]   open  . CARDIAC CATHETERIZATION  01-23-2009  dr Darnell Level brodie   normal coronary arteries and lvf  . CARDIOVASCULAR STRESS TEST  03/31/2016   Low risk nuclear study w/ medium defect of mild severity in basal anterior and mid anterior location w/ no evidence ischemia or infarction/  normal LV function and wall motion,  nuclear stress ef 71%  . CATARACT EXTRACTION W/ INTRAOCULAR LENS IMPLANT Left 09/2016  . COLONOSCOPY  last one 04-24-2016  . HYSTEROSCOPY W/D&C N/A 12/22/2016   Procedure: DILATATION AND CURETTAGE /HYSTEROSCOPY;  Surgeon: Dian Queen, MD;  [facility];  Service: Gynecology;  Laterality: N/A;  . IR US GUIDANCE  09/08/2016  . POSTERIOR LUMBAR FUSION  02/ 2018    [facility] ([place])  . SKIN CANCER EXCISION    . TONSILLECTOMY  child  . 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 TRANSTHORACIC ECHOCARDIOGRAM  01/21/2009   mild LVH, ef 09-81%, grade 1 diastolic dysfunction  . ULNAR NERVE TRANSPOSITION Left 12-20-2008  dr sypher   decompression and partial resection medial triceps fascia  . WIDE LOCAL EXCISION PILONIDAL AREA  02-02-2002  dr  Rise Patience   recurrent squamous cell carcinoma in situ    Social History   Socioeconomic History  . Marital status: Married    Spouse name: Clare Gandy  . Number of children: 2  . Years of education: Not on file  . Highest education level: Not on file  Occupational History  . Occupation: retired Tour manager  . Financial resource strain: Not hard at all  . Food insecurity:    Worry: Never true    Inability: Never true  . Transportation needs:    Medical: No    Non-medical: No  Tobacco Use  . Smoking status: Former Smoker  Packs/day: 0.50    Years: 10.00    Pack years: 5.00    Types: Cigarettes    Last attempt to quit: 12/10/1973    Years since quitting: 44.0  . Smokeless tobacco: Never Used  Substance and Sexual Activity  . Alcohol use: Yes    Comment: occasional  . Drug use: No  . Sexual activity: Not on file  Lifestyle  . Physical activity:    Days per week: 3 days    Minutes per session: 40 min  . Stress: Only a little  Relationships  . Social connections:    Talks on phone: More than three times a week    Gets together: More than three times a week    Attends religious service: Not on file    Active member of club or organization: Not on file    Attends meetings of clubs or organizations: Not on file    Relationship status: Married  Other Topics Concern  . Not on file  Social History Narrative   2 children ages 62,35    Family History  Problem Relation Age of Onset  . Esophageal cancer Father   . Heart disease Father        Unknown what kind  . Hyperlipidemia Father   . Hypertension Father   . Cancer Father   . Heart failure Father   . Hyperlipidemia Mother   . Hypertension Mother   . CAD Sister        50 years younger than patient - had stent. Was told that her anorexia/bulimia may have played a role  . Stroke Maternal Grandmother 66  . Diabetes Maternal Grandmother   . Heart attack Maternal Grandfather 93  . COPD Maternal Grandfather   .  Heart attack Paternal Grandmother        17s  . Diabetes Unknown        MGGM    Review of Systems  Constitutional: Negative for chills and fever.  Respiratory: Negative for cough, shortness of breath and wheezing.   Cardiovascular: Positive for leg swelling (chronic, mild). Negative for chest pain and palpitations.  Neurological: Negative for light-headedness and headaches.       Objective:   Vitals:   12/14/17 1355  BP: 138/74  Pulse: (!) 109  Resp: 16  Temp: 98.4 F (36.9 C)  SpO2: 97%   BP Readings from Last 3 Encounters:  12/14/17 138/74  12/12/17 (!) 151/77  11/22/17 118/73   Wt Readings from Last 3 Encounters:  12/14/17 194 lb (88 kg)  12/12/17 190 lb (86.2 kg)  11/22/17 192 lb (87.1 kg)   Body mass index is 31.31 kg/m.   Physical Exam    Constitutional: Appears well-developed and well-nourished. No distress.  HENT:  Head: Normocephalic and atraumatic.  Neck: Neck supple. No tracheal deviation present. No thyromegaly present.  No cervical lymphadenopathy Cardiovascular: Normal rate, regular rhythm and normal heart sounds.   No murmur heard. No carotid bruit .  No edema Pulmonary/Chest: Effort normal and breath sounds normal. No respiratory distress. No has no wheezes. No rales.  Skin: Skin is warm and dry. Not diaphoretic.  Psychiatric: Normal mood and affect. Behavior is normal.      Assessment & Plan:    See Problem List for Assessment and Plan of chronic medical problems.

## 2017-12-14 NOTE — Assessment & Plan Note (Signed)
Sugars very well controlled-A1c normal range 3 months ago Taking metformin and Victoza Exercising regularly and working on weight loss

## 2017-12-14 NOTE — Patient Instructions (Addendum)
   Medications reviewed and updated.   No changes recommended at this time.     Please followup in 6 months for a physical

## 2017-12-14 NOTE — Assessment & Plan Note (Signed)
Recent lipid panel well controlled Continue current dose of statin

## 2017-12-14 NOTE — Assessment & Plan Note (Addendum)
Taking Mirapex and hydrocodone nightly Taking Lyrica Will see a specialist in [place] in one month Not ideally controlled

## 2017-12-14 NOTE — Assessment & Plan Note (Signed)
BP well controlled Current regimen effective and well tolerated Continue current medications at current doses  

## 2017-12-15 ENCOUNTER — Ambulatory Visit (INDEPENDENT_AMBULATORY_CARE_PROVIDER_SITE_OTHER): Payer: Medicare Other | Admitting: Family Medicine

## 2017-12-15 VITALS — BP 106/70 | HR 94 | Temp 98.4°F | Ht 66.0 in | Wt 191.0 lb

## 2017-12-15 DIAGNOSIS — E119 Type 2 diabetes mellitus without complications: Secondary | ICD-10-CM

## 2017-12-15 DIAGNOSIS — Z6830 Body mass index (BMI) 30.0-30.9, adult: Secondary | ICD-10-CM | POA: Diagnosis not present

## 2017-12-15 DIAGNOSIS — E669 Obesity, unspecified: Secondary | ICD-10-CM

## 2017-12-15 DIAGNOSIS — E66811 Obesity, class 1: Secondary | ICD-10-CM

## 2017-12-15 MED ORDER — LIRAGLUTIDE 18 MG/3ML ~~LOC~~ SOPN: PEN_INJECTOR | SUBCUTANEOUS | 0 refills | Status: DC

## 2017-12-15 MED ORDER — INSULIN PEN NEEDLE 32G X 4 MM MISC: 1.0000 | Freq: Every day | 0 refills | Status: DC

## 2017-12-15 MED ORDER — METFORMIN HCL 500 MG PO TABS: ORAL_TABLET | ORAL | 0 refills | Status: DC

## 2017-12-16 NOTE — Progress Notes (Signed)
Office: [phone]  /  Fax: [phone]   HPI:   Chief Complaint: OBESITY Erin Roach is here to discuss her progress with her obesity treatment plan. She is on the keep a food journal with 1200-1400 calories and 80 grams of protein daily and is following her eating plan approximately 35 % of the time. She states she is bike riding and walking for 15-20 minutes 2-4 times per week. Erin Roach continues to lose weight but hasn't been journaling, mostly portion control and smarter food choices. Hunger is controlled and she is working on increasing lean protein and vegetables and notes decreased cravings.  Her weight is 191 lb (86.6 kg) today and has had a weight loss of 1 pound over a period of 3 weeks since her last visit. She has lost 32 lbs since starting treatment with [name].  Diabetes II Erin Roach has a diagnosis of diabetes type II. Erin Roach states fasting BGs range mostly between 120 and 130's and 2 hour post prandial mostly in 150's even on vacation. She denies any hypoglycemic episodes. Last A1c was 5.4 on 09/28/17. She has been working on intensive lifestyle modifications including diet, exercise, and weight loss to help control her blood glucose levels.  ALLERGIES: No Known Allergies  MEDICATIONS: Current Outpatient Medications on File Prior to Visit  Medication Sig Dispense Refill  . Blood Glucose Monitoring Suppl (ONE TOUCH ULTRA 2) w/Device KIT USE TO TEST BLOOD SUGARS UP TO 4 TIMES DAILY 1 each 0  . Cholecalciferol (VITAMIN D) 2000 units tablet Take 1 tablet (2,000 Units total) by mouth daily.    . fluticasone (FLONASE) 50 MCG/ACT nasal spray Place 1 spray into both nostrils daily. 16 g 0  . furosemide (LASIX) 20 MG tablet TAKE ONE TABLET BY MOUTH DAILY 90 tablet 1  . glucose blood (ONE TOUCH ULTRA TEST) test strip USE AS INSTRUCTED TWICE DAILY 100 each 3  . HYDROcodone-acetaminophen (NORCO/VICODIN) 5-325 MG tablet Take 1 tablet by mouth at bedtime. 30 tablet 0  . losartan (COZAAR) 100 MG  tablet TAKE ONE TABLET BY MOUTH DAILY 90 tablet 3  . ONETOUCH DELICA LANCETS 40H MISC Use twice daily 100 each 3  . pramipexole (MIRAPEX) 0.25 MG tablet Take 0.5 mg by mouth 2 (two) times daily. 1630  And  Bedtime    . pregabalin (LYRICA) 75 MG capsule TAKE ONE CAPSULE BY MOUTH EVERY AFTERNOON AND TAKE TWO CAPSULES BY MOUTH EVERY NIGHT AT BEDTIME 270 capsule 0  . simvastatin (ZOCOR) 40 MG tablet TAKE ONE TABLET BY MOUTH AT BEDTIME 90 tablet 3  . [DISCONTINUED] VESICARE 10 MG tablet TAKE 1 TABLET EVERY DAY 30 tablet 0   No current facility-administered medications on file prior to visit.     PAST MEDICAL HISTORY: Past Medical History:  Diagnosis Date  . Allergic rhinitis   . Apnea   . Arthritis   . Asthma    very mild  . Back pain   . Bilateral lower extremity edema   . Carotid artery disease ([place])    a. Carotid duplex 03/2016 - duplex was stable, 47-42% RICA, 5-95% LICA  . Complication of anesthesia    post-op delirium  . Endometrial polyp   . Essential hypertension   . GERD (gastroesophageal reflux disease)   . Heart murmur    per pt  . History of adenomatous polyp of colon    04-24-2016  tubular adenoma  . History of squamous cell carcinoma excision    pilonidal area second excision 02-02-2002  . HTN (  hypertension)   . Hyperlipidemia   . Leg pain   . OAB (overactive bladder)   . RA (rheumatoid arthritis) ([place])   . RLS (restless legs syndrome)   . Shortness of breath on exertion   . SUI (stress urinary incontinence, female)   . Venous insufficiency    legs  . Vitamin D deficiency     PAST SURGICAL HISTORY: Past Surgical History:  Procedure Laterality Date  . APPENDECTOMY  07-02-2005   [name]   open  . CARDIAC CATHETERIZATION  01-23-2009  dr Darnell Level brodie   normal coronary arteries and lvf  . CARDIOVASCULAR STRESS TEST  03/31/2016   Low risk nuclear study w/ medium defect of mild severity in basal anterior and mid anterior location w/ no evidence ischemia or  infarction/  normal LV function and wall motion,  nuclear stress ef 71%  . CATARACT EXTRACTION W/ INTRAOCULAR LENS IMPLANT Left 09/2016  . COLONOSCOPY  last one 04-24-2016  . HYSTEROSCOPY W/D&C N/A 12/22/2016   Procedure: DILATATION AND CURETTAGE /HYSTEROSCOPY;  Surgeon: Dian Queen, MD;  Location: [facility];  Service: Gynecology;  Laterality: N/A;  . IR US GUIDANCE  09/08/2016  . POSTERIOR LUMBAR FUSION  02/ 2018    [facility] ([place], [place])  . SKIN CANCER EXCISION    . TONSILLECTOMY  child  . 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 TRANSTHORACIC ECHOCARDIOGRAM  01/21/2009   mild LVH, ef 75-44%, grade 1 diastolic dysfunction  . ULNAR NERVE TRANSPOSITION Left 12-20-2008  dr sypher   decompression and partial resection medial triceps fascia  . WIDE LOCAL EXCISION PILONIDAL AREA  02-02-2002  [name]   recurrent squamous cell carcinoma in situ    SOCIAL HISTORY: Social History   Tobacco Use  . Smoking status: Former Smoker    Packs/day: 0.50    Years: 10.00    Pack years: 5.00    Types: Cigarettes    Last attempt to quit: 12/10/1973    Years since quitting: 44.0  . Smokeless tobacco: Never Used  Substance Use Topics  . Alcohol use: Yes    Comment: occasional  . Drug use: No    FAMILY HISTORY: Family History  Problem Relation Age of Onset  . Esophageal cancer Father   . Heart disease Father        Unknown what kind  . Hyperlipidemia Father   . Hypertension Father   . Cancer Father   . Heart failure Father   . Hyperlipidemia Mother   . Hypertension Mother   . CAD Sister        3 years younger than patient - had stent. Was told that her anorexia/bulimia may have played a role  . Stroke Maternal Grandmother 66  . Diabetes Maternal Grandmother   . Heart attack Maternal Grandfather 93  . COPD Maternal  Grandfather   . Heart attack Paternal Grandmother        70s  . Diabetes Unknown        MGGM    ROS: Review of Systems  Constitutional: Positive for weight loss.  Endo/Heme/Allergies:       Negative hypoglycemia    PHYSICAL EXAM: Blood pressure 106/70, pulse 94, temperature 98.4 F (36.9 C), temperature source Oral, height '5\' 6"'  (1.676 m), weight 191 lb (86.6 kg), SpO2 97 %. Body mass index is 30.83 kg/m. Physical Exam  Constitutional: She is oriented to person, place, and time. She appears well-developed and well-nourished.  Cardiovascular: Normal rate.  Pulmonary/Chest: Effort normal.  Musculoskeletal: Normal range of motion.  Neurological: She is oriented to person, place, and time.  Skin: Skin is warm and dry.  Psychiatric: She has a normal mood and affect. Her behavior is normal.  Vitals reviewed.   RECENT LABS AND TESTS: BMET    Component Value Date/Time   NA 141 09/28/2017 0906   K 4.3 09/28/2017 0906   CL 105 09/28/2017 0906   CO2 19 (L) 09/28/2017 0906   GLUCOSE 94 09/28/2017 0906   GLUCOSE 147 (H) 12/08/2016 1436   BUN 15 09/28/2017 0906   CREATININE 0.67 09/28/2017 0906   CALCIUM 8.9 09/28/2017 0906   GFRNONAA 87 09/28/2017 0906   GFRAA 100 09/28/2017 0906   Lab Results  Component Value Date   HGBA1C 5.4 09/28/2017   HGBA1C 5.5 06/09/2017   HGBA1C 8.2 (H) 02/23/2017   HGBA1C 5.6 12/08/2016   HGBA1C 5.3 06/04/2016   Lab Results  Component Value Date   INSULIN 17.0 09/28/2017   INSULIN 28.3 (H) 06/09/2017   INSULIN 30.7 (H) 02/23/2017   CBC    Component Value Date/Time   WBC 6.2 09/28/2017 0906   WBC 6.8 12/15/2016 1159   RBC 4.07 09/28/2017 0906   RBC 4.23 12/15/2016 1159   HGB 11.8 09/28/2017 0906   HCT 35.4 09/28/2017 0906   PLT 242 12/15/2016 1159   MCV 87 09/28/2017 0906   MCH 29.0 09/28/2017 0906   MCH 30.3 12/15/2016 1159   MCHC 33.3 09/28/2017 0906   MCHC 34.8 12/15/2016 1159   RDW 14.3 09/28/2017 0906   LYMPHSABS 1.5  09/28/2017 0906   MONOABS 0.5 06/04/2016 0808   EOSABS 0.2 09/28/2017 0906   BASOSABS 0.0 09/28/2017 0906   Iron/TIBC/Ferritin/ %Sat    Component Value Date/Time   IRON 109 03/02/2017 1035   TIBC 333 03/19/2015 1448   FERRITIN 218.0 03/02/2017 1035   IRONPCTSAT 15 03/19/2015 1448   Lipid Panel     Component Value Date/Time   CHOL 173 09/28/2017 0906   TRIG 140 09/28/2017 0906   TRIG 101 05/19/2006 0832   HDL 50 09/28/2017 0906   CHOLHDL 4 06/04/2016 0808   VLDL 31.4 06/04/2016 0808   LDLCALC 95 09/28/2017 0906   LDLDIRECT 141.3 09/06/2012 0907   Hepatic Function Panel     Component Value Date/Time   PROT 6.1 09/28/2017 0906   ALBUMIN 4.0 09/28/2017 0906   AST 15 09/28/2017 0906   ALT 16 09/28/2017 0906   ALKPHOS 72 09/28/2017 0906   BILITOT 0.5 09/28/2017 0906   BILIDIR 0.1 12/04/2013 1724      Component Value Date/Time   TSH 4.050 09/28/2017 0906   TSH 4.810 (H) 02/23/2017 1007   TSH 4.22 06/04/2016 0808    ASSESSMENT AND PLAN: Class 1 obesity with serious comorbidity and body mass index (BMI) of 30.0 to 30.9 in adult, unspecified obesity type  Type 2 diabetes mellitus without complication, without long-term current use of insulin ([place]) - Plan: metFORMIN (GLUCOPHAGE) 500 MG tablet, liraglutide (VICTOZA) 18 MG/3ML SOPN, Insulin Pen Needle (BD PEN NEEDLE NANO U/F) 32G X 4 MM MISC  PLAN:  Diabetes II Erin Roach has been given extensive diabetes education by myself today including ideal fasting and post-prandial blood glucose readings, individual ideal Hgb A1c goals and hypoglycemia prevention. We discussed the importance of good blood sugar control to decrease the likelihood of diabetic complications such  as nephropathy, neuropathy, limb loss, blindness, coronary artery disease, and death. We discussed the importance of intensive lifestyle modification including diet, exercise and weight loss as the first line treatment for diabetes. Erin Roach agrees to continue taking  metformin 500 BID #60 and we will refill for 1 month, and she agrees to continue Victoza 1.2 mg q AM #3 pens and we will refill for 1 month and we will refill nano needles. Erin Roach agrees to follow up with our clinic in 2 to 3 weeks.  We spent > than 50% of the 30 minute visit on the counseling as documented in the note.  Obesity Erin Roach is currently in the action stage of change. As such, her goal is to continue with weight loss efforts She has agreed to keep a food journal with 1200-1500 calories and 90+ grams of protein daily Erin Roach has been instructed to work up to a goal of 150 minutes of combined cardio and strengthening exercise per week for weight loss and overall health benefits. We discussed the following Behavioral Modification Strategies today: increasing lean protein intake, work on meal planning and easy cooking plans, and keep a strict food journal   Erin Roach has agreed to follow up with our clinic in 2 to 3 weeks. She was informed of the importance of frequent follow up visits to maximize her success with intensive lifestyle modifications for her multiple health conditions.   OBESITY BEHAVIORAL INTERVENTION VISIT  Today's visit was # 16 out of 22.  Starting weight: 223 lbs Starting date: 02/23/17 Today's weight : 191 lbs Today's date: 12/15/2017 Total lbs lost to date: 61    ASK: We discussed the diagnosis of obesity with Erin Roach today and Erin Roach agreed to give [name] permission to discuss obesity behavioral modification therapy today.  ASSESS: Erin Roach has the diagnosis of obesity and her BMI today is 30.84 Erin Roach is in the action stage of change   ADVISE: Erin Roach was educated on the multiple health risks of obesity as well as the benefit of weight loss to improve her health. She was advised of the need for long term treatment and the importance of lifestyle modifications.  AGREE: Multiple dietary modification options and treatment options were discussed and  Erin Roach agreed to  the above obesity treatment plan.  I, Trixie Dredge, am acting as transcriptionist for Dennard Nip, MD  I have reviewed the above documentation for accuracy and completeness, and I agree with the above. -Dennard Nip, MD

## 2017-12-28 ENCOUNTER — Encounter: Payer: Self-pay | Admitting: Internal Medicine

## 2017-12-28 ENCOUNTER — Telehealth: Payer: Self-pay

## 2017-12-28 MED ORDER — HYDROCODONE-ACETAMINOPHEN 5-325 MG PO TABS: 1.0000 | ORAL_TABLET | Freq: Every day | ORAL | 0 refills | Status: DC

## 2017-12-28 NOTE — Telephone Encounter (Signed)
Pt requesting refill of hydrocodone. Last refill was 12/09/17 per database.   Last OV- 12/14/17 Next OV- 06/15/2018

## 2017-12-30 ENCOUNTER — Encounter (INDEPENDENT_AMBULATORY_CARE_PROVIDER_SITE_OTHER): Payer: Self-pay | Admitting: Family Medicine

## 2017-12-30 ENCOUNTER — Encounter: Payer: Self-pay | Admitting: Internal Medicine

## 2018-01-06 ENCOUNTER — Ambulatory Visit (INDEPENDENT_AMBULATORY_CARE_PROVIDER_SITE_OTHER): Payer: Medicare Other | Admitting: Family Medicine

## 2018-01-06 VITALS — BP 117/70 | HR 86 | Temp 98.0°F | Ht 66.0 in | Wt 194.0 lb

## 2018-01-06 DIAGNOSIS — E669 Obesity, unspecified: Secondary | ICD-10-CM | POA: Diagnosis not present

## 2018-01-06 DIAGNOSIS — E119 Type 2 diabetes mellitus without complications: Secondary | ICD-10-CM | POA: Diagnosis not present

## 2018-01-06 DIAGNOSIS — Z6831 Body mass index (BMI) 31.0-31.9, adult: Secondary | ICD-10-CM

## 2018-01-06 DIAGNOSIS — E559 Vitamin D deficiency, unspecified: Secondary | ICD-10-CM

## 2018-01-06 DIAGNOSIS — J3089 Other allergic rhinitis: Secondary | ICD-10-CM | POA: Diagnosis not present

## 2018-01-06 NOTE — Progress Notes (Signed)
Office: [phone]  /  Fax: [phone]   HPI:   Chief Complaint: OBESITY Erin Roach is here to discuss her progress with her obesity treatment plan. She is on the keep a food journal with 1200-1500 calories and 90+ grams of protein daily and is following her eating plan approximately 50 % of the time. She states she is bike riding for 15-20 minutes 3 times per week. Erin Roach is journaling on and off but notes increase night time snacking when she wakes up from worsening restless leg syndrome.  Her weight is 194 lb (88 kg) today and has gained 3 pounds since her last visit. She has lost 29 lbs since starting treatment with [name].  Diabetes II Erin Roach has a diagnosis of diabetes type II. Erin Roach states fasting BGs have increased from 120's to 160's in the last month, and at the same time her sleep has worsened from restless leg syndrome. She is on metformin and she denies any hypoglycemic episodes. Last A1c was 5.4. She has been working on intensive lifestyle modifications including diet, exercise, and weight loss to help control her blood glucose levels.  Vitamin D Deficiency Erin Roach has a diagnosis of vitamin D deficiency. She is on OTC Vit D, but not yet at goal. She is due for labs and denies nausea, vomiting or muscle weakness.  ALLERGIES: No Known Allergies  MEDICATIONS: Current Outpatient Medications on File Prior to Visit  Medication Sig Dispense Refill  . Blood Glucose Monitoring Suppl (ONE TOUCH ULTRA 2) w/Device KIT USE TO TEST BLOOD SUGARS UP TO 4 TIMES DAILY 1 each 0  . Cholecalciferol (VITAMIN D) 2000 units tablet Take 1 tablet (2,000 Units total) by mouth daily.    . fluticasone (FLONASE) 50 MCG/ACT nasal spray Place 1 spray into both nostrils daily. 16 g 0  . furosemide (LASIX) 20 MG tablet TAKE ONE TABLET BY MOUTH DAILY 90 tablet 1  . glucose blood (ONE TOUCH ULTRA TEST) test strip USE AS INSTRUCTED TWICE DAILY 100 each 3  . HYDROcodone-acetaminophen (NORCO/VICODIN) 5-325 MG tablet  Take 1 tablet by mouth at bedtime. 30 tablet 0  . Insulin Pen Needle (BD PEN NEEDLE NANO U/F) 32G X 4 MM MISC 1 Package by Does not apply route daily at 12 noon. 100 each 0  . liraglutide (VICTOZA) 18 MG/3ML SOPN INJECT 1.2 MG TOTAL INTO THE SKIN EVERY MORNING. 3 pen 0  . losartan (COZAAR) 100 MG tablet TAKE ONE TABLET BY MOUTH DAILY 90 tablet 3  . metFORMIN (GLUCOPHAGE) 500 MG tablet TAKE ONE TABLET BY MOUTH TWICE A DAY WITH A MEAL 40 tablet 0  . ONETOUCH DELICA LANCETS 35W MISC Use twice daily 100 each 3  . pramipexole (MIRAPEX) 0.25 MG tablet Take 0.5 mg by mouth 2 (two) times daily. 1630  And  Bedtime    . pregabalin (LYRICA) 75 MG capsule TAKE ONE CAPSULE BY MOUTH EVERY AFTERNOON AND TAKE TWO CAPSULES BY MOUTH EVERY NIGHT AT BEDTIME 270 capsule 0  . simvastatin (ZOCOR) 40 MG tablet TAKE ONE TABLET BY MOUTH AT BEDTIME 90 tablet 3  . [DISCONTINUED] VESICARE 10 MG tablet TAKE 1 TABLET EVERY DAY 30 tablet 0   No current facility-administered medications on file prior to visit.     PAST MEDICAL HISTORY: Past Medical History:  Diagnosis Date  . Allergic rhinitis   . Apnea   . Arthritis   . Asthma    very mild  . Back pain   . Bilateral lower extremity edema   .  Carotid artery disease ([place])    a. Carotid duplex 03/2016 - duplex was stable, 92-11% RICA, 9-41% LICA  . Complication of anesthesia    post-op delirium  . Endometrial polyp   . Essential hypertension   . GERD (gastroesophageal reflux disease)   . Heart murmur    per pt  . History of adenomatous polyp of colon    04-24-2016  tubular adenoma  . History of squamous cell carcinoma excision    pilonidal area second excision 02-02-2002  . HTN (hypertension)   . Hyperlipidemia   . Leg pain   . OAB (overactive bladder)   . RA (rheumatoid arthritis) ([place])   . RLS (restless legs syndrome)   . Shortness of breath on exertion   . SUI (stress urinary incontinence, female)   . Venous insufficiency    legs  . Vitamin D  deficiency     PAST SURGICAL HISTORY: Past Surgical History:  Procedure Laterality Date  . APPENDECTOMY  07-02-2005   [name]   open  . CARDIAC CATHETERIZATION  01-23-2009  dr Darnell Level brodie   normal coronary arteries and lvf  . CARDIOVASCULAR STRESS TEST  03/31/2016   Low risk nuclear study w/ medium defect of mild severity in basal anterior and mid anterior location w/ no evidence ischemia or infarction/  normal LV function and wall motion,  nuclear stress ef 71%  . CATARACT EXTRACTION W/ INTRAOCULAR LENS IMPLANT Left 09/2016  . COLONOSCOPY  last one 04-24-2016  . HYSTEROSCOPY W/D&C N/A 12/22/2016   Procedure: DILATATION AND CURETTAGE /HYSTEROSCOPY;  Surgeon: Dian Queen, MD;  Location: [facility];  Service: Gynecology;  Laterality: N/A;  . IR US GUIDANCE  09/08/2016  . POSTERIOR LUMBAR FUSION  02/ 2018    [facility] ([place], [place])  . SKIN CANCER EXCISION    . TONSILLECTOMY  child  . 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 TRANSTHORACIC ECHOCARDIOGRAM  01/21/2009   mild LVH, ef 74-08%, grade 1 diastolic dysfunction  . ULNAR NERVE TRANSPOSITION Left 12-20-2008  dr sypher   decompression and partial resection medial triceps fascia  . WIDE LOCAL EXCISION PILONIDAL AREA  02-02-2002  [name]   recurrent squamous cell carcinoma in situ    SOCIAL HISTORY: Social History   Tobacco Use  . Smoking status: Former Smoker    Packs/day: 0.50    Years: 10.00    Pack years: 5.00    Types: Cigarettes    Last attempt to quit: 12/10/1973    Years since quitting: 44.1  . Smokeless tobacco: Never Used  Substance Use Topics  . Alcohol use: Yes    Comment: occasional  . Drug use: No    FAMILY HISTORY: Family History  Problem Relation Age of Onset  . Esophageal cancer Father   . Heart disease Father        Unknown what kind   . Hyperlipidemia Father   . Hypertension Father   . Cancer Father   . Heart failure Father   . Hyperlipidemia Mother   . Hypertension Mother   . CAD Sister        5 years younger than patient - had stent. Was told that her anorexia/bulimia may have played a role  . Stroke Maternal Grandmother 66  . Diabetes Maternal Grandmother   . Heart attack Maternal Grandfather 93  . COPD Maternal Grandfather   .  Heart attack Paternal Grandmother        53s  . Diabetes Unknown        MGGM    ROS: Review of Systems  Constitutional: Negative for weight loss.  Gastrointestinal: Negative for nausea and vomiting.  Musculoskeletal:       Negative muscle weakness  Endo/Heme/Allergies:       Negative hypoglycemia    PHYSICAL EXAM: Blood pressure 117/70, pulse 86, temperature 98 F (36.7 C), temperature source Oral, height '5\' 6"'  (1.676 m), weight 194 lb (88 kg), SpO2 100 %. Body mass index is 31.31 kg/m. Physical Exam  Constitutional: She is oriented to person, place, and time. She appears well-developed and well-nourished.  Cardiovascular: Normal rate.  Pulmonary/Chest: Effort normal.  Musculoskeletal: Normal range of motion.  Neurological: She is oriented to person, place, and time.  Skin: Skin is warm and dry.  Psychiatric: She has a normal mood and affect. Her behavior is normal.  Vitals reviewed.   RECENT LABS AND TESTS: BMET    Component Value Date/Time   NA 141 09/28/2017 0906   K 4.3 09/28/2017 0906   CL 105 09/28/2017 0906   CO2 19 (L) 09/28/2017 0906   GLUCOSE 94 09/28/2017 0906   GLUCOSE 147 (H) 12/08/2016 1436   BUN 15 09/28/2017 0906   CREATININE 0.67 09/28/2017 0906   CALCIUM 8.9 09/28/2017 0906   GFRNONAA 87 09/28/2017 0906   GFRAA 100 09/28/2017 0906   Lab Results  Component Value Date   HGBA1C 5.4 09/28/2017   HGBA1C 5.5 06/09/2017   HGBA1C 8.2 (H) 02/23/2017   HGBA1C 5.6 12/08/2016   HGBA1C 5.3 06/04/2016   Lab Results  Component Value Date    INSULIN 17.0 09/28/2017   INSULIN 28.3 (H) 06/09/2017   INSULIN 30.7 (H) 02/23/2017   CBC    Component Value Date/Time   WBC 6.2 09/28/2017 0906   WBC 6.8 12/15/2016 1159   RBC 4.07 09/28/2017 0906   RBC 4.23 12/15/2016 1159   HGB 11.8 09/28/2017 0906   HCT 35.4 09/28/2017 0906   PLT 242 12/15/2016 1159   MCV 87 09/28/2017 0906   MCH 29.0 09/28/2017 0906   MCH 30.3 12/15/2016 1159   MCHC 33.3 09/28/2017 0906   MCHC 34.8 12/15/2016 1159   RDW 14.3 09/28/2017 0906   LYMPHSABS 1.5 09/28/2017 0906   MONOABS 0.5 06/04/2016 0808   EOSABS 0.2 09/28/2017 0906   BASOSABS 0.0 09/28/2017 0906   Iron/TIBC/Ferritin/ %Sat    Component Value Date/Time   IRON 109 03/02/2017 1035   TIBC 333 03/19/2015 1448   FERRITIN 218.0 03/02/2017 1035   IRONPCTSAT 15 03/19/2015 1448   Lipid Panel     Component Value Date/Time   CHOL 173 09/28/2017 0906   TRIG 140 09/28/2017 0906   TRIG 101 05/19/2006 0832   HDL 50 09/28/2017 0906   CHOLHDL 4 06/04/2016 0808   VLDL 31.4 06/04/2016 0808   LDLCALC 95 09/28/2017 0906   LDLDIRECT 141.3 09/06/2012 0907   Hepatic Function Panel     Component Value Date/Time   PROT 6.1 09/28/2017 0906   ALBUMIN 4.0 09/28/2017 0906   AST 15 09/28/2017 0906   ALT 16 09/28/2017 0906   ALKPHOS 72 09/28/2017 0906   BILITOT 0.5 09/28/2017 0906   BILIDIR 0.1 12/04/2013 1724      Component Value Date/Time   TSH 4.050 09/28/2017 0906   TSH 4.810 (H) 02/23/2017 1007   TSH 4.22 06/04/2016 0808  Results for CHYNNA, BUERKLE (MRN [id number])  as of 01/06/2018 17:17  Ref. Range 09/28/2017 09:06  Vitamin D, 25-Hydroxy Latest Ref Range: 30.0 - 100.0 ng/mL 34.5    ASSESSMENT AND PLAN: Type 2 diabetes mellitus without complication, without long-term current use of insulin (HCC) - Plan: Comprehensive metabolic panel, Hemoglobin A1c, Insulin, random, Microalbumin / creatinine urine ratio  Vitamin D deficiency - Plan: VITAMIN D 25 Hydroxy (Vit-D Deficiency, Fractures)  Class  1 obesity with serious comorbidity and body mass index (BMI) of 31.0 to 31.9 in adult, unspecified obesity type  PLAN:  Diabetes II Clemie has been given extensive diabetes education by myself today including ideal fasting and post-prandial blood glucose readings, individual ideal Hgb A1c goals and hypoglycemia prevention. We discussed the importance of good blood sugar control to decrease the likelihood of diabetic complications such as nephropathy, neuropathy, limb loss, blindness, coronary artery disease, and death. We discussed the importance of intensive lifestyle modification including diet, exercise and weight loss as the first line treatment for diabetes. Arnesia agrees to continue taking metformin 500 mg BID #60 and we will refill for 1 month, and she agrees to continue Victoza 1.2 mg q AM #3 pens and we will refill for 1 month. We will check labs and [place] agrees to follow up with our clinic in 2 to 3 weeks.  Vitamin D Deficiency Mahayla was informed that low vitamin D levels contributes to fatigue and are associated with obesity, breast, and colon cancer. Dilan agrees to continue taking OTC Vit D and will follow up for routine testing of vitamin D, at least 2-3 times per year. She was informed of the risk of over-replacement of vitamin D and agrees to not increase her dose unless she discusses this with [name] first. We will check labs and Nolan agrees to follow up with our clinic in 2 to 3 weeks.  Obesity Halima is currently in the action stage of change. As such, her goal is to continue with weight loss efforts She has agreed to keep a food journal with 1200-1500 calories and 90+ grams of protein daily Chiquetta has been instructed to work up to a goal of 150 minutes of combined cardio and strengthening exercise per week for weight loss and overall health benefits. We discussed the following Behavioral Modification Strategies today: increasing lean protein intake, work on meal planning and easy cooking  plans, and keep a strict food journal Neha was encouraged to work on meal planning and some simple high protein recipes were given.  Yee has agreed to follow up with our clinic in 2 to 3 weeks. She was informed of the importance of frequent follow up visits to maximize her success with intensive lifestyle modifications for her multiple health conditions.   OBESITY BEHAVIORAL INTERVENTION VISIT  Today's visit was # 17   Starting weight: 223 lbs Starting date: 02/23/17 Today's weight : 194 lbs  Today's date: 01/06/2018 Total lbs lost to date: 29 At least 15 minutes were spent on discussing the following behavioral intervention visit.   ASK: We discussed the diagnosis of obesity with [name] today and [place] agreed to give [name] permission to discuss obesity behavioral modification therapy today.  ASSESS: Khylah has the diagnosis of obesity and her BMI today is 31.33 Lenee is in the action stage of change   ADVISE: Chevon was educated on the multiple health risks of obesity as well as the benefit of weight loss to improve her health. She was advised of the need for long term treatment and the importance  of lifestyle modifications to improve her current health and to decrease her risk of future health problems.  AGREE: Multiple dietary modification options and treatment options were discussed and  Kebrina agreed to follow the recommendations documented in the above note.  ARRANGE: Signa was educated on the importance of frequent visits to treat obesity as outlined per CMS and USPSTF guidelines and agreed to schedule her next follow up appointment today.  I, Trixie Dredge, am acting as transcriptionist for Dennard Nip, MD  I have reviewed the above documentation for accuracy and completeness, and I agree with the above. -Dennard Nip, MD

## 2018-01-07 ENCOUNTER — Encounter (INDEPENDENT_AMBULATORY_CARE_PROVIDER_SITE_OTHER): Payer: Self-pay | Admitting: Family Medicine

## 2018-01-07 LAB — COMPREHENSIVE METABOLIC PANEL
ALBUMIN: 4.1 g/dL (ref 3.5–4.8)
ALT: 17 IU/L (ref 0–32)
AST: 13 IU/L (ref 0–40)
Albumin/Globulin Ratio: 1.8 (ref 1.2–2.2)
Alkaline Phosphatase: 74 IU/L (ref 39–117)
BILIRUBIN TOTAL: 0.5 mg/dL (ref 0.0–1.2)
BUN / CREAT RATIO: 29 — AB (ref 12–28)
BUN: 21 mg/dL (ref 8–27)
CALCIUM: 8.9 mg/dL (ref 8.7–10.3)
CHLORIDE: 100 mmol/L (ref 96–106)
CO2: 23 mmol/L (ref 20–29)
CREATININE: 0.72 mg/dL (ref 0.57–1.00)
GFR, EST AFRICAN AMERICAN: 95 mL/min/{1.73_m2} (ref >59)
GFR, EST NON AFRICAN AMERICAN: 83 mL/min/{1.73_m2} (ref >59)
GLUCOSE: 101 mg/dL — AB (ref 65–99)
Globulin, Total: 2.3 g/dL (ref 1.5–4.5)
Potassium: 4.6 mmol/L (ref 3.5–5.2)
Sodium: 140 mmol/L (ref 134–144)
TOTAL PROTEIN: 6.4 g/dL (ref 6.0–8.5)

## 2018-01-07 LAB — INSULIN, RANDOM: INSULIN: 24.6 u[IU]/mL (ref 2.6–24.9)

## 2018-01-07 LAB — HEMOGLOBIN A1C
Est. average glucose Bld gHb Est-mCnc: 105 mg/dL
Hgb A1c MFr Bld: 5.3 % (ref 4.8–5.6)

## 2018-01-07 LAB — MICROALBUMIN / CREATININE URINE RATIO
CREATININE, UR: 40.7 mg/dL
MICROALB/CREAT RATIO: 123.8 mg/g{creat} — AB (ref 0.0–30.0)
Microalbumin, Urine: 50.4 ug/mL

## 2018-01-07 LAB — VITAMIN D 25 HYDROXY (VIT D DEFICIENCY, FRACTURES): VIT D 25 HYDROXY: 27.8 ng/mL — AB (ref 30.0–100.0)

## 2018-01-10 ENCOUNTER — Encounter (INDEPENDENT_AMBULATORY_CARE_PROVIDER_SITE_OTHER): Payer: Self-pay | Admitting: Family Medicine

## 2018-01-11 ENCOUNTER — Encounter (INDEPENDENT_AMBULATORY_CARE_PROVIDER_SITE_OTHER): Payer: Self-pay

## 2018-01-11 MED ORDER — FLUTICASONE PROPIONATE 50 MCG/ACT NA SUSP: 1.0000 | Freq: Every day | NASAL | 0 refills | Status: DC

## 2018-01-11 MED ORDER — LIRAGLUTIDE 18 MG/3ML ~~LOC~~ SOPN: PEN_INJECTOR | SUBCUTANEOUS | 0 refills | Status: DC

## 2018-01-11 MED ORDER — METFORMIN HCL 500 MG PO TABS: ORAL_TABLET | ORAL | 0 refills | Status: DC

## 2018-01-11 NOTE — Telephone Encounter (Signed)
Please check on this.

## 2018-01-20 ENCOUNTER — Ambulatory Visit (INDEPENDENT_AMBULATORY_CARE_PROVIDER_SITE_OTHER): Payer: Medicare Other | Admitting: Family Medicine

## 2018-01-20 VITALS — BP 119/75 | HR 90 | Temp 98.8°F | Ht 66.0 in | Wt 191.0 lb

## 2018-01-20 DIAGNOSIS — Z6830 Body mass index (BMI) 30.0-30.9, adult: Secondary | ICD-10-CM

## 2018-01-20 DIAGNOSIS — E669 Obesity, unspecified: Secondary | ICD-10-CM | POA: Diagnosis not present

## 2018-01-20 DIAGNOSIS — E559 Vitamin D deficiency, unspecified: Secondary | ICD-10-CM | POA: Diagnosis not present

## 2018-01-20 MED ORDER — VITAMIN D (ERGOCALCIFEROL) 1.25 MG (50000 UNIT) PO CAPS: 50000.0000 [IU] | ORAL_CAPSULE | ORAL | 0 refills | Status: DC

## 2018-01-24 NOTE — Progress Notes (Signed)
Office: [phone]  /  Fax: [phone]   HPI:   Chief Complaint: OBESITY Erin Roach is here to discuss her progress with her obesity treatment plan. Erin Roach is on the keep a food journal with 1200-1500 calories and 90+ grams of protein daily and is following her eating plan approximately 65 % of the time. Erin Roach states Erin Roach is walking or pedaling for 20-30 minutes 4 times per week. Erin Roach continues to do well with weight loss. Erin Roach is journaling on and off and is doing well with meal planning and meeting her protein goal.  Her weight is 191 lb (86.6 kg) today and has had a weight loss of 3 pounds over a period of 2 weeks since her last visit. Erin Roach has lost 32 lbs since starting treatment with [name].  Vitamin D Deficiency Erin Roach has a diagnosis of vitamin D deficiency. Erin Roach is on OTC Vit D, but level is worsening. Erin Roach notes fatigue and denies nausea, vomiting or muscle weakness.  ALLERGIES: No Known Allergies  MEDICATIONS: Current Outpatient Medications on File Prior to Visit  Medication Sig Dispense Refill  . Blood Glucose Monitoring Suppl (ONE TOUCH ULTRA 2) w/Device KIT USE TO TEST BLOOD SUGARS UP TO 4 TIMES DAILY 1 each 0  . Cholecalciferol (VITAMIN D) 2000 units tablet Take 1 tablet (2,000 Units total) by mouth daily.    . fluticasone (FLONASE) 50 MCG/ACT nasal spray Place 1 spray into both nostrils daily. 16 g 0  . furosemide (LASIX) 20 MG tablet TAKE ONE TABLET BY MOUTH DAILY 90 tablet 1  . glucose blood (ONE TOUCH ULTRA TEST) test strip USE AS INSTRUCTED TWICE DAILY 100 each 3  . HYDROcodone-acetaminophen (NORCO/VICODIN) 5-325 MG tablet Take 1 tablet by mouth at bedtime. 30 tablet 0  . Insulin Pen Needle (BD PEN NEEDLE NANO U/F) 32G X 4 MM MISC 1 Package by Does not apply route daily at 12 noon. 100 each 0  . liraglutide (VICTOZA) 18 MG/3ML SOPN INJECT 1.2 MG TOTAL INTO THE SKIN EVERY MORNING. 3 pen 0  . losartan (COZAAR) 100 MG tablet TAKE ONE TABLET BY MOUTH DAILY 90 tablet 3  . metFORMIN  (GLUCOPHAGE) 500 MG tablet TAKE ONE TABLET BY MOUTH TWICE A DAY WITH A MEAL 60 tablet 0  . ONETOUCH DELICA LANCETS 97C MISC Use twice daily 100 each 3  . pramipexole (MIRAPEX) 0.25 MG tablet Take 0.5 mg by mouth 2 (two) times daily. 1630  And  Bedtime    . pregabalin (LYRICA) 75 MG capsule TAKE ONE CAPSULE BY MOUTH EVERY AFTERNOON AND TAKE TWO CAPSULES BY MOUTH EVERY NIGHT AT BEDTIME 270 capsule 0  . simvastatin (ZOCOR) 40 MG tablet TAKE ONE TABLET BY MOUTH AT BEDTIME 90 tablet 3  . [DISCONTINUED] VESICARE 10 MG tablet TAKE 1 TABLET EVERY DAY 30 tablet 0   No current facility-administered medications on file prior to visit.     PAST MEDICAL HISTORY: Past Medical History:  Diagnosis Date  . Allergic rhinitis   . Apnea   . Arthritis   . Asthma    very mild  . Back pain   . Bilateral lower extremity edema   . Carotid artery disease ([place])    a. Carotid duplex 03/2016 - duplex was stable, 16-38% RICA, 4-53% LICA  . Complication of anesthesia    post-op delirium  . Endometrial polyp   . Essential hypertension   . GERD (gastroesophageal reflux disease)   . Heart murmur    per pt  . History of adenomatous  polyp of colon    04-24-2016  tubular adenoma  . History of squamous cell carcinoma excision    pilonidal area second excision 02-02-2002  . HTN (hypertension)   . Hyperlipidemia   . Leg pain   . OAB (overactive bladder)   . RA (rheumatoid arthritis) ([place])   . RLS (restless legs syndrome)   . Shortness of breath on exertion   . SUI (stress urinary incontinence, female)   . Venous insufficiency    legs  . Vitamin D deficiency     PAST SURGICAL HISTORY: Past Surgical History:  Procedure Laterality Date  . APPENDECTOMY  07-02-2005   [name]   open  . CARDIAC CATHETERIZATION  01-23-2009  dr Darnell Level brodie   normal coronary arteries and lvf  . CARDIOVASCULAR STRESS TEST  03/31/2016   Low risk nuclear study w/ medium defect of mild severity in basal anterior and mid  anterior location w/ no evidence ischemia or infarction/  normal LV function and wall motion,  nuclear stress ef 71%  . CATARACT EXTRACTION W/ INTRAOCULAR LENS IMPLANT Left 09/2016  . COLONOSCOPY  last one 04-24-2016  . HYSTEROSCOPY W/D&C N/A 12/22/2016   Procedure: DILATATION AND CURETTAGE /HYSTEROSCOPY;  Surgeon: Dian Queen, MD;  Location: [facility];  Service: Gynecology;  Laterality: N/A;  . IR US GUIDANCE  09/08/2016  . POSTERIOR LUMBAR FUSION  02/ 2018    [facility] ([place], [place])  . SKIN CANCER EXCISION    . TONSILLECTOMY  child  . 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 TRANSTHORACIC ECHOCARDIOGRAM  01/21/2009   mild LVH, ef 47-09%, grade 1 diastolic dysfunction  . ULNAR NERVE TRANSPOSITION Left 12-20-2008  dr sypher   decompression and partial resection medial triceps fascia  . WIDE LOCAL EXCISION PILONIDAL AREA  02-02-2002  [name]   recurrent squamous cell carcinoma in situ    SOCIAL HISTORY: Social History   Tobacco Use  . Smoking status: Former Smoker    Packs/day: 0.50    Years: 10.00    Pack years: 5.00    Types: Cigarettes    Last attempt to quit: 12/10/1973    Years since quitting: 44.1  . Smokeless tobacco: Never Used  Substance Use Topics  . Alcohol use: Yes    Comment: occasional  . Drug use: No    FAMILY HISTORY: Family History  Problem Relation Age of Onset  . Esophageal cancer Father   . Heart disease Father        Unknown what kind  . Hyperlipidemia Father   . Hypertension Father   . Cancer Father   . Heart failure Father   . Hyperlipidemia Mother   . Hypertension Mother   . CAD Sister        20 years younger than patient - had stent. Was told that her anorexia/bulimia may have played a role  . Stroke Maternal Grandmother 66  . Diabetes Maternal Grandmother   . Heart attack  Maternal Grandfather 93  . COPD Maternal Grandfather   . Heart attack Paternal Grandmother        63s  . Diabetes Unknown        MGGM    ROS: Review of Systems  Constitutional: Positive for malaise/fatigue and weight loss.  Gastrointestinal: Negative for nausea and vomiting.  Musculoskeletal:       Negative muscle weakness  PHYSICAL EXAM: Blood pressure 119/75, pulse 90, temperature 98.8 F (37.1 C), temperature source Oral, height '5\' 6"'  (1.676 m), weight 191 lb (86.6 kg), SpO2 96 %. Body mass index is 30.83 kg/m. Physical Exam  Constitutional: Erin Roach is oriented to person, place, and time. Erin Roach appears well-developed and well-nourished.  Cardiovascular: Normal rate.  Pulmonary/Chest: Effort normal.  Musculoskeletal: Normal range of motion.  Neurological: Erin Roach is oriented to person, place, and time.  Skin: Skin is warm and dry.  Psychiatric: Erin Roach has a normal mood and affect. Her behavior is normal.  Vitals reviewed.   RECENT LABS AND TESTS: BMET    Component Value Date/Time   NA 140 01/06/2018 0919   K 4.6 01/06/2018 0919   CL 100 01/06/2018 0919   CO2 23 01/06/2018 0919   GLUCOSE 101 (H) 01/06/2018 0919   GLUCOSE 147 (H) 12/08/2016 1436   BUN 21 01/06/2018 0919   CREATININE 0.72 01/06/2018 0919   CALCIUM 8.9 01/06/2018 0919   GFRNONAA 83 01/06/2018 0919   GFRAA 95 01/06/2018 0919   Lab Results  Component Value Date   HGBA1C 5.3 01/06/2018   HGBA1C 5.4 09/28/2017   HGBA1C 5.5 06/09/2017   HGBA1C 8.2 (H) 02/23/2017   HGBA1C 5.6 12/08/2016   Lab Results  Component Value Date   INSULIN 24.6 01/06/2018   INSULIN 17.0 09/28/2017   INSULIN 28.3 (H) 06/09/2017   INSULIN 30.7 (H) 02/23/2017   CBC    Component Value Date/Time   WBC 6.2 09/28/2017 0906   WBC 6.8 12/15/2016 1159   RBC 4.07 09/28/2017 0906   RBC 4.23 12/15/2016 1159   HGB 11.8 09/28/2017 0906   HCT 35.4 09/28/2017 0906   PLT 242 12/15/2016 1159   MCV 87 09/28/2017 0906   MCH 29.0 09/28/2017  0906   MCH 30.3 12/15/2016 1159   MCHC 33.3 09/28/2017 0906   MCHC 34.8 12/15/2016 1159   RDW 14.3 09/28/2017 0906   LYMPHSABS 1.5 09/28/2017 0906   MONOABS 0.5 06/04/2016 0808   EOSABS 0.2 09/28/2017 0906   BASOSABS 0.0 09/28/2017 0906   Iron/TIBC/Ferritin/ %Sat    Component Value Date/Time   IRON 109 03/02/2017 1035   TIBC 333 03/19/2015 1448   FERRITIN 218.0 03/02/2017 1035   IRONPCTSAT 15 03/19/2015 1448   Lipid Panel     Component Value Date/Time   CHOL 173 09/28/2017 0906   TRIG 140 09/28/2017 0906   TRIG 101 05/19/2006 0832   HDL 50 09/28/2017 0906   CHOLHDL 4 06/04/2016 0808   VLDL 31.4 06/04/2016 0808   LDLCALC 95 09/28/2017 0906   LDLDIRECT 141.3 09/06/2012 0907   Hepatic Function Panel     Component Value Date/Time   PROT 6.4 01/06/2018 0919   ALBUMIN 4.1 01/06/2018 0919   AST 13 01/06/2018 0919   ALT 17 01/06/2018 0919   ALKPHOS 74 01/06/2018 0919   BILITOT 0.5 01/06/2018 0919   BILIDIR 0.1 12/04/2013 1724      Component Value Date/Time   TSH 4.050 09/28/2017 0906   TSH 4.810 (H) 02/23/2017 1007   TSH 4.22 06/04/2016 0808  Results for RODOLFO, NOTARO (MRN [id number]) as of 01/24/2018 12:39  Ref. Range 01/06/2018 09:19  Vitamin D, 25-Hydroxy Latest Ref Range: 30.0 - 100.0 ng/mL 27.8 (L)    ASSESSMENT AND PLAN: Vitamin D deficiency - Plan: Vitamin D, Ergocalciferol, (DRISDOL) 50000 units CAPS capsule  Class 1 obesity with serious comorbidity and body mass index (BMI) of 30.0 to 30.9 in adult, unspecified obesity type  PLAN:  Vitamin D Deficiency Erin Roach was informed that low vitamin D levels contributes to fatigue and are associated with obesity, breast, and colon cancer. Erin Roach agrees to continue taking OTC Vit D 2,000 IU daily and Erin Roach agrees to start prescription Vit D '@50' ,000 IU every week #4 with no refills. Erin Roach will follow up for routine testing of vitamin D, at least 2-3 times per year. Erin Roach was informed of the risk of over-replacement of vitamin D  and agrees to not increase her dose unless Erin Roach discusses this with [name] first. Erin Roach agrees to follow up with our clinic in 3 weeks.  Obesity Erin Roach is currently in the action stage of change. As such, her goal is to continue with weight loss efforts Erin Roach has agreed to keep a food journal with 1200-1500 calories and 90+ grams of protein daily Erin Roach has been instructed to work up to a goal of 150 minutes of combined cardio and strengthening exercise per week for weight loss and overall health benefits. We discussed the following Behavioral Modification Strategies today: increasing lean protein intake, decreasing simple carbohydrates, and keep a strict food journal    Erin Roach has agreed to follow up with our clinic in 3 weeks. Erin Roach was informed of the importance of frequent follow up visits to maximize her success with intensive lifestyle modifications for her multiple health conditions.   OBESITY BEHAVIORAL INTERVENTION VISIT  Today's visit was # 18   Starting weight: 223 lbs Starting date: 02/23/17 Today's weight : 191 lbs  Today's date: 01/20/2018 Total lbs lost to date: 32 At least 15 minutes were spent on discussing the following behavioral intervention visit.   ASK: We discussed the diagnosis of obesity with Erin Roach today and Erin Roach agreed to give [name] permission to discuss obesity behavioral modification therapy today.  ASSESS: Erin Roach has the diagnosis of obesity and her BMI today is 30.84 Catilyn is in the action stage of change   ADVISE: Erin Roach was educated on the multiple health risks of obesity as well as the benefit of weight loss to improve her health. Erin Roach was advised of the need for long term treatment and the importance of lifestyle modifications to improve her current health and to decrease her risk of future health problems.  AGREE: Multiple dietary modification options and treatment options were discussed and  Erin Roach agreed to follow the recommendations documented in the  above note.  ARRANGE: Erin Roach was educated on the importance of frequent visits to treat obesity as outlined per CMS and USPSTF guidelines and agreed to schedule her next follow up appointment today.  I, Trixie Dredge, am acting as transcriptionist for Dennard Nip, MD  I have reviewed the above documentation for accuracy and completeness, and I agree with the above. -Dennard Nip, MD

## 2018-01-26 ENCOUNTER — Encounter (INDEPENDENT_AMBULATORY_CARE_PROVIDER_SITE_OTHER): Payer: Self-pay | Admitting: Family Medicine

## 2018-01-27 ENCOUNTER — Other Ambulatory Visit (INDEPENDENT_AMBULATORY_CARE_PROVIDER_SITE_OTHER): Payer: Self-pay

## 2018-01-27 DIAGNOSIS — E119 Type 2 diabetes mellitus without complications: Secondary | ICD-10-CM

## 2018-01-27 MED ORDER — GLUCOSE BLOOD VI STRP: ORAL_STRIP | 0 refills | Status: DC

## 2018-01-27 NOTE — Telephone Encounter (Signed)
Please refill strips, thx!

## 2018-01-30 ENCOUNTER — Encounter (INDEPENDENT_AMBULATORY_CARE_PROVIDER_SITE_OTHER): Payer: Self-pay | Admitting: Family Medicine

## 2018-02-01 ENCOUNTER — Other Ambulatory Visit (INDEPENDENT_AMBULATORY_CARE_PROVIDER_SITE_OTHER): Payer: Self-pay

## 2018-02-01 ENCOUNTER — Encounter: Payer: Self-pay | Admitting: Internal Medicine

## 2018-02-01 DIAGNOSIS — E119 Type 2 diabetes mellitus without complications: Secondary | ICD-10-CM

## 2018-02-01 MED ORDER — GLUCOSE BLOOD VI STRP: ORAL_STRIP | 0 refills | Status: DC

## 2018-02-01 MED ORDER — INSULIN PEN NEEDLE 32G X 4 MM MISC: 1.0000 | Freq: Two times a day (BID) | 0 refills | Status: DC

## 2018-02-01 NOTE — Telephone Encounter (Signed)
Check [place] registry last filled 01/07/2018.[name]/LMB

## 2018-02-01 NOTE — Telephone Encounter (Signed)
Please look into this

## 2018-02-02 ENCOUNTER — Other Ambulatory Visit (INDEPENDENT_AMBULATORY_CARE_PROVIDER_SITE_OTHER): Payer: Self-pay | Admitting: Family Medicine

## 2018-02-02 DIAGNOSIS — J3089 Other allergic rhinitis: Secondary | ICD-10-CM

## 2018-02-02 MED ORDER — HYDROCODONE-ACETAMINOPHEN 5-325 MG PO TABS: 1.0000 | ORAL_TABLET | Freq: Every day | ORAL | 0 refills | Status: DC

## 2018-02-03 ENCOUNTER — Ambulatory Visit: Payer: Medicare Other | Admitting: Nurse Practitioner

## 2018-02-03 ENCOUNTER — Encounter: Payer: Self-pay | Admitting: Nurse Practitioner

## 2018-02-03 VITALS — BP 118/60 | HR 92 | Temp 98.5°F | Ht 66.0 in | Wt 197.0 lb

## 2018-02-03 DIAGNOSIS — B9789 Other viral agents as the cause of diseases classified elsewhere: Secondary | ICD-10-CM

## 2018-02-03 DIAGNOSIS — J069 Acute upper respiratory infection, unspecified: Secondary | ICD-10-CM | POA: Diagnosis not present

## 2018-02-03 DIAGNOSIS — J45909 Unspecified asthma, uncomplicated: Secondary | ICD-10-CM

## 2018-02-03 MED ORDER — HYDROCODONE-HOMATROPINE 5-1.5 MG/5ML PO SYRP: 5.0000 mL | ORAL_SOLUTION | Freq: Three times a day (TID) | ORAL | 0 refills | Status: DC | PRN

## 2018-02-03 MED ORDER — BUDESONIDE-FORMOTEROL FUMARATE 160-4.5 MCG/ACT IN AERO: 2.0000 | INHALATION_SPRAY | Freq: Every day | RESPIRATORY_TRACT | 1 refills | Status: DC

## 2018-02-03 MED ORDER — ALBUTEROL SULFATE HFA 108 (90 BASE) MCG/ACT IN AERS: 2.0000 | INHALATION_SPRAY | Freq: Four times a day (QID) | RESPIRATORY_TRACT | 2 refills | Status: DC | PRN

## 2018-02-03 NOTE — Patient Instructions (Addendum)
Please resume symbicort daily Please start albuterol 1-2 puffs every 6 hours for the next 2 days, then reduce back to as needed  I have sent hycodan cough medicine for you. This medication can cause drowsiness. Please do not drink alcohol or operate machinery when you take this medication.  For nasal and head congestion may take flonase spray 2 sprays to each nostril daily for 1 week. Saline nasal spray used frequently. For drainage may use Allegra, Claritin or Zyrtec. If you need stronger  Drink plenty of fluids and stay well-hydrated.  Please follow up for fevers >101, worsening symptoms, or if your symptoms dont improve with inhalers.   Upper Respiratory Infection, Adult Most upper respiratory infections (URIs) are caused by a virus. A URI affects the nose, throat, and upper air passages. The most common type of URI is often called "the common cold." Follow these instructions at home:  Take medicines only as told by your doctor.  Gargle warm saltwater or take cough drops to comfort your throat as told by your doctor.  Use a warm mist humidifier or inhale steam from a shower to increase air moisture. This may make it easier to breathe.  Drink enough fluid to keep your pee (urine) clear or pale yellow.  Eat soups and other clear broths.  Have a healthy diet.  Rest as needed.  Go back to work when your fever is gone or your doctor says it is okay. ? You may need to stay home longer to avoid giving your URI to others. ? You can also wear a face mask and wash your hands often to prevent spread of the virus.  Use your inhaler more if you have asthma.  Do not use any tobacco products, including cigarettes, chewing tobacco, or electronic cigarettes. If you need help quitting, ask your doctor. Contact a doctor if:  You are getting worse, not better.  Your symptoms are not helped by medicine.  You have chills.  You are getting more short of breath.  You have brown or red  mucus.  You have yellow or brown discharge from your nose.  You have pain in your face, especially when you bend forward.  You have a fever.  You have puffy (swollen) neck glands.  You have pain while swallowing.  You have white areas in the back of your throat. Get help right away if:  You have very bad or constant: ? Headache. ? Ear pain. ? Pain in your forehead, behind your eyes, and over your cheekbones (sinus pain). ? Chest pain.  You have long-lasting (chronic) lung disease and any of the following: ? Wheezing. ? Long-lasting cough. ? Coughing up blood. ? A change in your usual mucus.  You have a stiff neck.  You have changes in your: ? Vision. ? Hearing. ? Thinking. ? Mood. This information is not intended to replace advice given to you by your health care provider. Make sure you discuss any questions you have with your health care provider. Document Released: 10/14/2007 Document Revised: 12/29/2015 Document Reviewed: 08/02/2013 Elsevier Interactive Patient Education  2018 [employer].

## 2018-02-03 NOTE — Progress Notes (Signed)
Name: Erin Roach   MRN: [id number]    DOB: 02-14-1941   Date:02/03/2018       Progress Note  Subjective  Chief Complaint Cough  HPI  Erin Roach is here today for evaluation of chills and nonproductive cough, first began about 2 days ago, concerned due to history of asthma. She says she has been maintained on symbicort daily, albuterol PRN for years for asthma maintenance, however ran out of inhalers several months ago and felt okay so did not refill, tried to refill today due to cough but there were no refills left. She also c/o nasal congestion, sore throat.  She denies weakness, headaches, confusion, chest pain, shortness of breath, abdominal pain, nausea, vomiting Overall feeling better today, would like something to help with the cough so she can sleep at night.  Patient Active Problem List   Diagnosis Date Noted  . OSA on CPAP 06/15/2017  . Diabetes ([place]) 03/02/2017  . Herpes zoster without complication 52/84/1324  . Shortness of breath on exertion 02/23/2017  . B12 nutritional deficiency 02/23/2017  . Vitamin D deficiency 12/08/2016  . Tubular adenoma of colon 04/29/2016  . Essential hypertension 09/12/2015  . Allergic rhinitis 07/17/2015  . Insomnia 07/17/2015  . Obese 04/10/2015  . S/P left TKA 04/09/2015  . Osteopenia 12/04/2012  . 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 RESTLESS LEG SYNDROME 02/11/2009  . LEG EDEMA, BILATERAL 02/11/2009  . Hyperlipidemia 10/30/2008  . Urinary incontinence 10/30/2008  . Skin cancer 10/30/2008  . Asthma 11/21/2007  . Venous (peripheral) insufficiency 11/22/2006  . SNORING 11/22/2006    Social History   Tobacco Use  . Smoking status: Former Smoker    Packs/day: 0.50    Years: 10.00    Pack years: 5.00    Types: Cigarettes    Last attempt to quit: 12/10/1973    Years since quitting: 44.1  . Smokeless tobacco: Never Used  Substance Use Topics  . Alcohol use: Yes    Comment: occasional     Current Outpatient Medications:  .   Blood Glucose Monitoring Suppl (ONE TOUCH ULTRA 2) w/Device KIT, USE TO TEST BLOOD SUGARS UP TO 4 TIMES DAILY, Disp: 1 each, Rfl: 0 .  Cholecalciferol (VITAMIN D) 2000 units tablet, Take 1 tablet (2,000 Units total) by mouth daily., Disp: , Rfl:  .  fluticasone (FLONASE) 50 MCG/ACT nasal spray, Place 1 spray into both nostrils daily., Disp: 16 g, Rfl: 0 .  furosemide (LASIX) 20 MG tablet, TAKE ONE TABLET BY MOUTH DAILY, Disp: 90 tablet, Rfl: 1 .  glucose blood (ACCU-CHEK AVIVA) test strip, Testing twice daily, Disp: 100 each, Rfl: 0 .  HYDROcodone-acetaminophen (NORCO/VICODIN) 5-325 MG tablet, Take 1 tablet by mouth at bedtime., Disp: 30 tablet, Rfl: 0 .  Insulin Pen Needle (BD PEN NEEDLE NANO 2ND GEN) 32G X 4 MM MISC, 1 Package by Does not apply route 2 (two) times daily., Disp: 100 each, Rfl: 0 .  Insulin Pen Needle (BD PEN NEEDLE NANO U/F) 32G X 4 MM MISC, 1 Package by Does not apply route daily at 12 noon., Disp: 100 each, Rfl: 0 .  liraglutide (VICTOZA) 18 MG/3ML SOPN, INJECT 1.2 MG TOTAL INTO THE SKIN EVERY MORNING., Disp: 3 pen, Rfl: 0 .  losartan (COZAAR) 100 MG tablet, TAKE ONE TABLET BY MOUTH DAILY, Disp: 90 tablet, Rfl: 3 .  metFORMIN (GLUCOPHAGE) 500 MG tablet, TAKE ONE TABLET BY MOUTH TWICE A DAY WITH A MEAL, Disp: 60 tablet, Rfl: 0 .  ONETOUCH DELICA LANCETS  33G MISC, Use twice daily, Disp: 100 each, Rfl: 3 .  pramipexole (MIRAPEX) 0.25 MG tablet, Take 0.5 mg by mouth 2 (two) times daily. 1630  And  Bedtime, Disp: , Rfl:  .  pregabalin (LYRICA) 75 MG capsule, TAKE ONE CAPSULE BY MOUTH EVERY AFTERNOON AND TAKE TWO CAPSULES BY MOUTH EVERY NIGHT AT BEDTIME, Disp: 270 capsule, Rfl: 0 .  simvastatin (ZOCOR) 40 MG tablet, TAKE ONE TABLET BY MOUTH AT BEDTIME, Disp: 90 tablet, Rfl: 3 .  Vitamin D, Ergocalciferol, (DRISDOL) 50000 units CAPS capsule, Take 1 capsule (50,000 Units total) by mouth every 7 (seven) days., Disp: 4 capsule, Rfl: 0  No Known Allergies  ROS  No other specific  complaints in a complete review of systems (except as listed in HPI above).  Objective  Vitals:   02/03/18 0926  BP: 118/60  Pulse: 92  Temp: 98.5 F (36.9 C)  TempSrc: Oral  SpO2: 96%  Weight: 197 lb (89.4 kg)  Height: _0  (1.676 m)    Body mass index is 31.8 kg/m.  Nursing Note and Vital Signs reviewed.  Physical Exam  Constitutional: Patient appears well-developed and well-nourished.  No distress.  HEENT: head atraumatic, normocephalic, pupils equal and reactive to light, EOM's intact, TM's without erythema or bulging, neck supple without lymphadenopathy, oropharynx pink and moist without exudate Cardiovascular: Normal rate, regular rhythm, S1/S2 present.  Distal pulses intact. Pulmonary/Chest: Effort normal and breath sounds clear. No respiratory distress or retractions. Neurological: She is alert and oriented to person, place, and time. No cranial nerve deficit. Coordination, balance, strength, speech and gait are normal.  Skin: Skin is warm and dry. No rash noted. No erythema.  Psychiatric: Patient has a normal mood and affect. behavior is normal. Judgment and thought content normal.    Assessment & Plan  1. Viral URI with cough - budesonide-formoterol (SYMBICORT) 160-4.5 MCG/ACT inhaler; Inhale 2 puffs into the lungs daily.  Dispense: 1 Inhaler; Refill: 1 - albuterol (PROAIR HFA) 108 (90 Base) MCG/ACT inhaler; Inhale 2 puffs into the lungs every 6 (six) hours as needed for wheezing or shortness of breath.  Dispense: 1 Inhaler; Refill: 2 - HYDROcodone-homatropine (HYCODAN) 5-1.5 MG/5ML syrup; Take 5 mLs by mouth every 8 (eight) hours as needed for cough.  Dispense: 120 mL; Refill: 0  2. Uncomplicated asthma, unspecified asthma severity, unspecified whether persistent - budesonide-formoterol (SYMBICORT) 160-4.5 MCG/ACT inhaler; Inhale 2 puffs into the lungs daily.  Dispense: 1 Inhaler; Refill: 1 - albuterol (PROAIR HFA) 108 (90 Base) MCG/ACT inhaler; Inhale 2 puffs  into the lungs every 6 (six) hours as needed for wheezing or shortness of breath.  Dispense: 1 Inhaler; Refill: 2  Will resume inhalers with instructions to dose albuterol x 2 days for acute symptoms Hycodan for cough- dosing and side effects discussed Home management, Red flags and when to present for emergency care or RTC including fever >101.62F, chest pain, shortness of breath, new/worsening/un-resolving symptoms, reviewed with patient at time of visit. Follow up and care instructions discussed and provided in AVS.

## 2018-02-07 ENCOUNTER — Other Ambulatory Visit (INDEPENDENT_AMBULATORY_CARE_PROVIDER_SITE_OTHER): Payer: Self-pay | Admitting: Family Medicine

## 2018-02-07 DIAGNOSIS — E119 Type 2 diabetes mellitus without complications: Secondary | ICD-10-CM

## 2018-02-10 ENCOUNTER — Other Ambulatory Visit (INDEPENDENT_AMBULATORY_CARE_PROVIDER_SITE_OTHER): Payer: Self-pay | Admitting: Family Medicine

## 2018-02-10 ENCOUNTER — Ambulatory Visit (INDEPENDENT_AMBULATORY_CARE_PROVIDER_SITE_OTHER): Payer: Medicare Other | Admitting: Family Medicine

## 2018-02-10 VITALS — BP 121/73 | HR 93 | Temp 98.6°F | Ht 66.0 in | Wt 192.0 lb

## 2018-02-10 DIAGNOSIS — Z9189 Other specified personal risk factors, not elsewhere classified: Secondary | ICD-10-CM | POA: Diagnosis not present

## 2018-02-10 DIAGNOSIS — Z6831 Body mass index (BMI) 31.0-31.9, adult: Secondary | ICD-10-CM

## 2018-02-10 DIAGNOSIS — E669 Obesity, unspecified: Secondary | ICD-10-CM | POA: Diagnosis not present

## 2018-02-10 DIAGNOSIS — E559 Vitamin D deficiency, unspecified: Secondary | ICD-10-CM

## 2018-02-10 DIAGNOSIS — E119 Type 2 diabetes mellitus without complications: Secondary | ICD-10-CM | POA: Diagnosis not present

## 2018-02-10 MED ORDER — VITAMIN D (ERGOCALCIFEROL) 1.25 MG (50000 UNIT) PO CAPS: 50000.0000 [IU] | ORAL_CAPSULE | ORAL | 0 refills | Status: DC

## 2018-02-10 MED ORDER — LIRAGLUTIDE 18 MG/3ML ~~LOC~~ SOPN: PEN_INJECTOR | SUBCUTANEOUS | 0 refills | Status: DC

## 2018-02-10 MED ORDER — METFORMIN HCL 500 MG PO TABS: ORAL_TABLET | ORAL | 0 refills | Status: DC

## 2018-02-12 ENCOUNTER — Other Ambulatory Visit (INDEPENDENT_AMBULATORY_CARE_PROVIDER_SITE_OTHER): Payer: Self-pay | Admitting: Family Medicine

## 2018-02-12 DIAGNOSIS — E559 Vitamin D deficiency, unspecified: Secondary | ICD-10-CM

## 2018-02-14 NOTE — Progress Notes (Signed)
Office: [phone]  /  Fax: [phone]   HPI:   Chief Complaint: OBESITY Erin Roach is here to discuss her progress with her obesity treatment plan. She is on the keep a food journal with 1200-1500 calories and 90+ grams of protein daily and is following her eating plan approximately 50 % of the time. She states she is walking and bike riding for 20 minutes 4 times per week. Erin Roach has been journaling on and off over the last few weeks. She has had an asthma exacerbation and she is still feeling tired. She states her hunger is mostly controlled but often wakes up in the middle of the night and snacks.  Her weight is 192 lb (87.1 kg) today and has gained 1 pound since her last visit. She has lost 31 lbs since starting treatment with [name].  Diabetes II Erin Roach has a diagnosis of diabetes type II. Erin Roach states fasting BGs range between 91 and 145 on metformin and Victoza. She denies hypoglycemia. Last A1c was 5.3. She has been working on intensive lifestyle modifications including diet, exercise, and weight loss to help control her blood glucose levels.  At risk for cardiovascular disease Erin Roach is at a higher than average risk for cardiovascular disease due to obesity and diabetes II. She currently denies any chest pain.  Vitamin D Deficiency Erin Roach has a diagnosis of vitamin D deficiency. She is stable on prescription Vit D, but level is not yet at goal. She denies nausea, vomiting or muscle weakness.  ALLERGIES: No Known Allergies  MEDICATIONS: Current Outpatient Medications on File Prior to Visit  Medication Sig Dispense Refill  . albuterol (PROAIR HFA) 108 (90 Base) MCG/ACT inhaler Inhale 2 puffs into the lungs every 6 (six) hours as needed for wheezing or shortness of breath. 1 Inhaler 2  . Blood Glucose Monitoring Suppl (ONE TOUCH ULTRA 2) w/Device KIT USE TO TEST BLOOD SUGARS UP TO 4 TIMES DAILY 1 each 0  . budesonide-formoterol (SYMBICORT) 160-4.5 MCG/ACT inhaler Inhale 2 puffs into the  lungs daily. 1 Inhaler 1  . Cholecalciferol (VITAMIN D) 2000 units tablet Take 1 tablet (2,000 Units total) by mouth daily.    . fluticasone (FLONASE) 50 MCG/ACT nasal spray Place 1 spray into both nostrils daily. 16 g 0  . furosemide (LASIX) 20 MG tablet TAKE ONE TABLET BY MOUTH DAILY 90 tablet 1  . glucose blood (ACCU-CHEK AVIVA) test strip Testing twice daily 100 each 0  . HYDROcodone-acetaminophen (NORCO/VICODIN) 5-325 MG tablet Take 1 tablet by mouth at bedtime. 30 tablet 0  . HYDROcodone-homatropine (HYCODAN) 5-1.5 MG/5ML syrup Take 5 mLs by mouth every 8 (eight) hours as needed for cough. 120 mL 0  . Insulin Pen Needle (BD PEN NEEDLE NANO 2ND GEN) 32G X 4 MM MISC 1 Package by Does not apply route 2 (two) times daily. 100 each 0  . Insulin Pen Needle (BD PEN NEEDLE NANO U/F) 32G X 4 MM MISC 1 Package by Does not apply route daily at 12 noon. 100 each 0  . losartan (COZAAR) 100 MG tablet TAKE ONE TABLET BY MOUTH DAILY 90 tablet 3  . ONETOUCH DELICA LANCETS 29F MISC Use twice daily 100 each 3  . pramipexole (MIRAPEX) 0.25 MG tablet Take 0.5 mg by mouth 2 (two) times daily. 1630  And  Bedtime    . pregabalin (LYRICA) 75 MG capsule TAKE ONE CAPSULE BY MOUTH EVERY AFTERNOON AND TAKE TWO CAPSULES BY MOUTH EVERY NIGHT AT BEDTIME 270 capsule 0  . simvastatin (ZOCOR)  40 MG tablet TAKE ONE TABLET BY MOUTH AT BEDTIME 90 tablet 3  . [DISCONTINUED] VESICARE 10 MG tablet TAKE 1 TABLET EVERY DAY 30 tablet 0   No current facility-administered medications on file prior to visit.     PAST MEDICAL HISTORY: Past Medical History:  Diagnosis Date  . Allergic rhinitis   . Apnea   . Arthritis   . Asthma    very mild  . Back pain   . Bilateral lower extremity edema   . Carotid artery disease ([place])    a. Carotid duplex 03/2016 - duplex was stable, 82-50% RICA, 0-37% LICA  . Complication of anesthesia    post-op delirium  . Endometrial polyp   . Essential hypertension   . GERD (gastroesophageal reflux  disease)   . Heart murmur    per pt  . History of adenomatous polyp of colon    04-24-2016  tubular adenoma  . History of squamous cell carcinoma excision    pilonidal area second excision 02-02-2002  . HTN (hypertension)   . Hyperlipidemia   . Leg pain   . OAB (overactive bladder)   . RA (rheumatoid arthritis) ([place])   . RLS (restless legs syndrome)   . Shortness of breath on exertion   . SUI (stress urinary incontinence, female)   . Venous insufficiency    legs  . Vitamin D deficiency     PAST SURGICAL HISTORY: Past Surgical History:  Procedure Laterality Date  . APPENDECTOMY  07-02-2005   [name]   open  . CARDIAC CATHETERIZATION  01-23-2009  dr Darnell Level brodie   normal coronary arteries and lvf  . CARDIOVASCULAR STRESS TEST  03/31/2016   Low risk nuclear study w/ medium defect of mild severity in basal anterior and mid anterior location w/ no evidence ischemia or infarction/  normal LV function and wall motion,  nuclear stress ef 71%  . CATARACT EXTRACTION W/ INTRAOCULAR LENS IMPLANT Left 09/2016  . COLONOSCOPY  last one 04-24-2016  . HYSTEROSCOPY W/D&C N/A 12/22/2016   Procedure: DILATATION AND CURETTAGE /HYSTEROSCOPY;  Surgeon: Dian Queen, MD;  Location: [facility];  Service: Gynecology;  Laterality: N/A;  . IR US GUIDANCE  09/08/2016  . POSTERIOR LUMBAR FUSION  02/ 2018    [facility] ([place], [place])  . SKIN CANCER EXCISION    . TONSILLECTOMY  child  . 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 TRANSTHORACIC ECHOCARDIOGRAM  01/21/2009   mild LVH, ef 04-88%, grade 1 diastolic dysfunction  . ULNAR NERVE TRANSPOSITION Left 12-20-2008  dr sypher   decompression and partial resection medial triceps fascia  . WIDE LOCAL EXCISION PILONIDAL AREA  02-02-2002  [name]   recurrent squamous cell carcinoma in situ     SOCIAL HISTORY: Social History   Tobacco Use  . Smoking status: Former Smoker    Packs/day: 0.50    Years: 10.00    Pack years: 5.00    Types: Cigarettes    Last attempt to quit: 12/10/1973    Years since quitting: 44.2  . Smokeless tobacco: Never Used  Substance Use Topics  . Alcohol use: Yes    Comment: occasional  . Drug use: No    FAMILY HISTORY: Family History  Problem Relation Age of Onset  . Esophageal cancer Father   . Heart disease Father        Unknown  what kind  . Hyperlipidemia Father   . Hypertension Father   . Cancer Father   . Heart failure Father   . Hyperlipidemia Mother   . Hypertension Mother   . CAD Sister        8 years younger than patient - had stent. Was told that her anorexia/bulimia may have played a role  . Stroke Maternal Grandmother 66  . Diabetes Maternal Grandmother   . Heart attack Maternal Grandfather 93  . COPD Maternal Grandfather   . Heart attack Paternal Grandmother        5s  . Diabetes Unknown        MGGM    ROS: Review of Systems  Constitutional: Negative for weight loss.  Cardiovascular: Negative for chest pain.  Gastrointestinal: Negative for nausea and vomiting.  Musculoskeletal:       Negative muscle weakness  Endo/Heme/Allergies:       Negative hypoglycemia    PHYSICAL EXAM: Blood pressure 121/73, pulse 93, temperature 98.6 F (37 C), temperature source Oral, height '5\' 6"'  (1.676 m), weight 192 lb (87.1 kg), SpO2 97 %. Body mass index is 30.99 kg/m. Physical Exam  Constitutional: She is oriented to person, place, and time. She appears well-developed and well-nourished.  Cardiovascular: Normal rate.  Pulmonary/Chest: Effort normal.  Musculoskeletal: Normal range of motion.  Neurological: She is oriented to person, place, and time.  Skin: Skin is warm and dry.  Psychiatric: She has a normal mood and affect. Her behavior is normal.  Vitals reviewed.   RECENT LABS AND TESTS: BMET    Component Value  Date/Time   NA 140 01/06/2018 0919   K 4.6 01/06/2018 0919   CL 100 01/06/2018 0919   CO2 23 01/06/2018 0919   GLUCOSE 101 (H) 01/06/2018 0919   GLUCOSE 147 (H) 12/08/2016 1436   BUN 21 01/06/2018 0919   CREATININE 0.72 01/06/2018 0919   CALCIUM 8.9 01/06/2018 0919   GFRNONAA 83 01/06/2018 0919   GFRAA 95 01/06/2018 0919   Lab Results  Component Value Date   HGBA1C 5.3 01/06/2018   HGBA1C 5.4 09/28/2017   HGBA1C 5.5 06/09/2017   HGBA1C 8.2 (H) 02/23/2017   HGBA1C 5.6 12/08/2016   Lab Results  Component Value Date   INSULIN 24.6 01/06/2018   INSULIN 17.0 09/28/2017   INSULIN 28.3 (H) 06/09/2017   INSULIN 30.7 (H) 02/23/2017   CBC    Component Value Date/Time   WBC 6.2 09/28/2017 0906   WBC 6.8 12/15/2016 1159   RBC 4.07 09/28/2017 0906   RBC 4.23 12/15/2016 1159   HGB 11.8 09/28/2017 0906   HCT 35.4 09/28/2017 0906   PLT 242 12/15/2016 1159   MCV 87 09/28/2017 0906   MCH 29.0 09/28/2017 0906   MCH 30.3 12/15/2016 1159   MCHC 33.3 09/28/2017 0906   MCHC 34.8 12/15/2016 1159   RDW 14.3 09/28/2017 0906   LYMPHSABS 1.5 09/28/2017 0906   MONOABS 0.5 06/04/2016 0808   EOSABS 0.2 09/28/2017 0906   BASOSABS 0.0 09/28/2017 0906   Iron/TIBC/Ferritin/ %Sat    Component Value Date/Time   IRON 109 03/02/2017 1035   TIBC 333 03/19/2015 1448   FERRITIN 218.0 03/02/2017 1035   IRONPCTSAT 15 03/19/2015 1448   Lipid Panel     Component Value Date/Time   CHOL 173 09/28/2017 0906   TRIG 140 09/28/2017 0906   TRIG 101 05/19/2006 0832   HDL 50 09/28/2017 0906   CHOLHDL 4 06/04/2016 0808   VLDL 31.4 06/04/2016 4174  LDLCALC 95 09/28/2017 0906   LDLDIRECT 141.3 09/06/2012 0907   Hepatic Function Panel     Component Value Date/Time   PROT 6.4 01/06/2018 0919   ALBUMIN 4.1 01/06/2018 0919   AST 13 01/06/2018 0919   ALT 17 01/06/2018 0919   ALKPHOS 74 01/06/2018 0919   BILITOT 0.5 01/06/2018 0919   BILIDIR 0.1 12/04/2013 1724      Component Value Date/Time   TSH  4.050 09/28/2017 0906   TSH 4.810 (H) 02/23/2017 1007   TSH 4.22 06/04/2016 0808  Results for SHANYCE, DARIS (MRN [id number]) as of 02/14/2018 12:25  Ref. Range 01/06/2018 09:19  Vitamin D, 25-Hydroxy Latest Ref Range: 30.0 - 100.0 ng/mL 27.8 (L)    ASSESSMENT AND PLAN: Type 2 diabetes mellitus without complication, without long-term current use of insulin (HCC) - Plan: metFORMIN (GLUCOPHAGE) 500 MG tablet, liraglutide (VICTOZA) 18 MG/3ML SOPN  Vitamin D deficiency - Plan: Vitamin D, Ergocalciferol, (DRISDOL) 50000 units CAPS capsule  At risk for heart disease  Class 1 obesity with serious comorbidity and body mass index (BMI) of 31.0 to 31.9 in adult, unspecified obesity type  PLAN:  Diabetes II Erin Roach has been given extensive diabetes education by myself today including ideal fasting and post-prandial blood glucose readings, individual ideal Hgb A1c goals and hypoglycemia prevention. We discussed the importance of good blood sugar control to decrease the likelihood of diabetic complications such as nephropathy, neuropathy, limb loss, blindness, coronary artery disease, and death. We discussed the importance of intensive lifestyle modification including diet, exercise and weight loss as the first line treatment for diabetes. Erin Roach agrees to continue taking metformin 500 mg qd #30 and we will refill for 1 month, and she agrees to increase Victoza to 1.8 mg SubQ q AM #3 pens with no refills. Erin Roach agrees to follow up with our clinic in 3 weeks.  Cardiovascular risk counselling Erin Roach was given extended (15 minutes) coronary artery disease prevention counseling today. She is 77 y.o. female and has risk factors for heart disease including obesity and diabetes II. We discussed intensive lifestyle modifications today with an emphasis on specific weight loss instructions and strategies. Pt was also informed of the importance of increasing exercise and decreasing saturated fats to help prevent heart  disease.  Vitamin D Deficiency Erin Roach was informed that low vitamin D levels contributes to fatigue and are associated with obesity, breast, and colon cancer. Erin Roach agrees to continue taking prescription Vit D '@50' ,000 IU every week #4 and we will refill for 1 month. She will follow up for routine testing of vitamin D, at least 2-3 times per year. She was informed of the risk of over-replacement of vitamin D and agrees to not increase her dose unless she discusses this with [name] first. Erin Roach agrees to follow up with our clinic in 3 weeks.  Obesity Erin Roach is currently in the action stage of change. As such, her goal is to continue with weight loss efforts She has agreed to keep a food journal with 1200-1500 calories and 90+ grams of protein daily Erin Roach has been instructed to work up to a goal of 150 minutes of combined cardio and strengthening exercise per week for weight loss and overall health benefits. We discussed the following Behavioral Modification Strategies today: increasing lean protein intake, decreasing simple carbohydrates, ways to avoid boredom eating, and better snacking choices   Erin Roach has agreed to follow up with our clinic in 3 weeks. She was informed of the importance of frequent follow up visits  to maximize her success with intensive lifestyle modifications for her multiple health conditions.   OBESITY BEHAVIORAL INTERVENTION VISIT  Today's visit was # 19   Starting weight: 223 lbs  Starting date: 02/23/17 Today's weight : 192 lbs  Today's date: 02/10/2018 Total lbs lost to date: 21    ASK: We discussed the diagnosis of obesity with Erin Roach today and Erin Roach agreed to give [name] permission to discuss obesity behavioral modification therapy today.  ASSESS: Erin Roach has the diagnosis of obesity and her BMI today is 31 Erin Roach is in the action stage of change   ADVISE: Erin Roach was educated on the multiple health risks of obesity as well as the benefit of weight loss to  improve her health. She was advised of the need for long term treatment and the importance of lifestyle modifications to improve her current health and to decrease her risk of future health problems.  AGREE: Multiple dietary modification options and treatment options were discussed and  Erin Roach agreed to follow the recommendations documented in the above note.  ARRANGE: Erin Roach was educated on the importance of frequent visits to treat obesity as outlined per CMS and USPSTF guidelines and agreed to schedule her next follow up appointment today.  I, Trixie Dredge, am acting as transcriptionist for Dennard Nip, MD  I have reviewed the above documentation for accuracy and completeness, and I agree with the above. -Dennard Nip, MD

## 2018-02-24 ENCOUNTER — Other Ambulatory Visit (HOSPITAL_COMMUNITY): Payer: Self-pay | Admitting: Orthopedic Surgery

## 2018-02-24 DIAGNOSIS — Z96659 Presence of unspecified artificial knee joint: Secondary | ICD-10-CM

## 2018-03-02 ENCOUNTER — Ambulatory Visit (HOSPITAL_COMMUNITY)
Admission: RE | Admit: 2018-03-02 | Discharge: 2018-03-02 | Disposition: A | Discharge status: Home / Self Care | Payer: Medicare Other | Source: Ambulatory Visit | Attending: Orthopedic Surgery | Admitting: Orthopedic Surgery

## 2018-03-02 DIAGNOSIS — Z96652 Presence of left artificial knee joint: Secondary | ICD-10-CM | POA: Diagnosis not present

## 2018-03-02 DIAGNOSIS — R937 Abnormal findings on diagnostic imaging of other parts of musculoskeletal system: Secondary | ICD-10-CM | POA: Diagnosis not present

## 2018-03-02 DIAGNOSIS — Z96659 Presence of unspecified artificial knee joint: Secondary | ICD-10-CM | POA: Diagnosis present

## 2018-03-02 MED ORDER — TECHNETIUM TC 99M MEDRONATE IV KIT
20.0000 | PACK | Freq: Once | INTRAVENOUS | Status: AC | PRN
  Administered 2018-03-02: 20 via INTRAVENOUS

## 2018-03-03 ENCOUNTER — Encounter: Payer: Self-pay | Admitting: Internal Medicine

## 2018-03-04 MED ORDER — HYDROCODONE-ACETAMINOPHEN 5-325 MG PO TABS: 1.0000 | ORAL_TABLET | Freq: Every day | ORAL | 0 refills | Status: DC

## 2018-03-07 ENCOUNTER — Ambulatory Visit (INDEPENDENT_AMBULATORY_CARE_PROVIDER_SITE_OTHER): Payer: Medicare Other | Admitting: Family Medicine

## 2018-03-08 ENCOUNTER — Ambulatory Visit (INDEPENDENT_AMBULATORY_CARE_PROVIDER_SITE_OTHER): Payer: Medicare Other | Admitting: Family Medicine

## 2018-03-08 VITALS — BP 126/76 | HR 97 | Temp 98.1°F | Ht 66.0 in | Wt 194.0 lb

## 2018-03-08 DIAGNOSIS — E119 Type 2 diabetes mellitus without complications: Secondary | ICD-10-CM

## 2018-03-08 DIAGNOSIS — E669 Obesity, unspecified: Secondary | ICD-10-CM | POA: Diagnosis not present

## 2018-03-08 DIAGNOSIS — E559 Vitamin D deficiency, unspecified: Secondary | ICD-10-CM | POA: Diagnosis not present

## 2018-03-08 DIAGNOSIS — Z6831 Body mass index (BMI) 31.0-31.9, adult: Secondary | ICD-10-CM

## 2018-03-08 MED ORDER — VITAMIN D (ERGOCALCIFEROL) 1.25 MG (50000 UNIT) PO CAPS: 50000.0000 [IU] | ORAL_CAPSULE | ORAL | 0 refills | Status: DC

## 2018-03-08 MED ORDER — METFORMIN HCL 500 MG PO TABS: ORAL_TABLET | ORAL | 0 refills | Status: DC

## 2018-03-09 ENCOUNTER — Encounter (INDEPENDENT_AMBULATORY_CARE_PROVIDER_SITE_OTHER): Payer: Self-pay | Admitting: Family Medicine

## 2018-03-09 NOTE — Progress Notes (Signed)
Office: [phone]  /  Fax: [phone]   HPI:   Chief Complaint: OBESITY Erin Roach is here to discuss her progress with her obesity treatment plan. She is keeping a food journal with 1500 calories and 90+ grams of protein and is following her eating plan approximately 20 % of the time. She states she is walking and using a stationary bike for 20 minutes 4 times per week. Erin Roach notes and increase in night time snacking due to waking frequently with RLS symptoms. She feels that if she could sleep better this comfort eating would resolve. She had her medications for RLS changed recently.  Her weight is 194 lb (88 kg) today and has had a weight gain of 2 pounds over a period of 4 weeks since her last visit. She has lost 29 lbs since starting treatment with [name].  Diabetes II Erin Roach has a diagnosis of diabetes type II. Erin Roach is on metformin and Victoza and she denies any hypoglycemic episodes. Her last A1c was 5.3 on 01/06/18 and very well controlled. She has been working on intensive lifestyle modifications including diet, exercise, and weight loss to help control her blood glucose levels.  Vitamin D deficiency Erin Roach has a diagnosis of vitamin D deficiency. She is currently taking vit D and denies nausea, vomiting or muscle weakness.  ALLERGIES: No Known Allergies  MEDICATIONS: Current Outpatient Medications on File Prior to Visit  Medication Sig Dispense Refill  . albuterol (PROAIR HFA) 108 (90 Base) MCG/ACT inhaler Inhale 2 puffs into the lungs every 6 (six) hours as needed for wheezing or shortness of breath. 1 Inhaler 2  . Blood Glucose Monitoring Suppl (ONE TOUCH ULTRA 2) w/Device KIT USE TO TEST BLOOD SUGARS UP TO 4 TIMES DAILY 1 each 0  . budesonide-formoterol (SYMBICORT) 160-4.5 MCG/ACT inhaler Inhale 2 puffs into the lungs daily. 1 Inhaler 1  . Cholecalciferol (VITAMIN D) 2000 units tablet Take 1 tablet (2,000 Units total) by mouth daily.    . fluticasone (FLONASE) 50 MCG/ACT nasal  spray Place 1 spray into both nostrils daily. 16 g 0  . furosemide (LASIX) 20 MG tablet TAKE ONE TABLET BY MOUTH DAILY 90 tablet 1  . glucose blood (ACCU-CHEK AVIVA) test strip Testing twice daily 100 each 0  . HYDROcodone-acetaminophen (NORCO/VICODIN) 5-325 MG tablet Take 1 tablet by mouth at bedtime. 30 tablet 0  . HYDROcodone-homatropine (HYCODAN) 5-1.5 MG/5ML syrup Take 5 mLs by mouth every 8 (eight) hours as needed for cough. 120 mL 0  . Insulin Pen Needle (BD PEN NEEDLE NANO 2ND GEN) 32G X 4 MM MISC 1 Package by Does not apply route 2 (two) times daily. 100 each 0  . liraglutide (VICTOZA) 18 MG/3ML SOPN INJECT 1.8 MG TOTAL INTO THE SKIN EVERY MORNING. 3 pen 0  . losartan (COZAAR) 100 MG tablet TAKE ONE TABLET BY MOUTH DAILY 90 tablet 3  . ONETOUCH DELICA LANCETS 03Y MISC Use twice daily 100 each 3  . pramipexole (MIRAPEX) 0.25 MG tablet Take 0.5 mg by mouth 2 (two) times daily. 1630  And  Bedtime    . pregabalin (LYRICA) 75 MG capsule TAKE ONE CAPSULE BY MOUTH EVERY AFTERNOON AND TAKE TWO CAPSULES BY MOUTH EVERY NIGHT AT BEDTIME 270 capsule 0  . simvastatin (ZOCOR) 40 MG tablet TAKE ONE TABLET BY MOUTH AT BEDTIME 90 tablet 3  . [DISCONTINUED] VESICARE 10 MG tablet TAKE 1 TABLET EVERY DAY 30 tablet 0   No current facility-administered medications on file prior to visit.  PAST MEDICAL HISTORY: Past Medical History:  Diagnosis Date  . Allergic rhinitis   . Apnea   . Arthritis   . Asthma    very mild  . Back pain   . Bilateral lower extremity edema   . Carotid artery disease ([place])    a. Carotid duplex 03/2016 - duplex was stable, 81-10% RICA, 3-15% LICA  . Complication of anesthesia    post-op delirium  . Endometrial polyp   . Essential hypertension   . GERD (gastroesophageal reflux disease)   . Heart murmur    per pt  . History of adenomatous polyp of colon    04-24-2016  tubular adenoma  . History of squamous cell carcinoma excision    pilonidal area second excision  02-02-2002  . HTN (hypertension)   . Hyperlipidemia   . Leg pain   . OAB (overactive bladder)   . RA (rheumatoid arthritis) ([place])   . RLS (restless legs syndrome)   . Shortness of breath on exertion   . SUI (stress urinary incontinence, female)   . Venous insufficiency    legs  . Vitamin D deficiency     PAST SURGICAL HISTORY: Past Surgical History:  Procedure Laterality Date  . APPENDECTOMY  07-02-2005   [name]   open  . CARDIAC CATHETERIZATION  01-23-2009  dr Darnell Level brodie   normal coronary arteries and lvf  . CARDIOVASCULAR STRESS TEST  03/31/2016   Low risk nuclear study w/ medium defect of mild severity in basal anterior and mid anterior location w/ no evidence ischemia or infarction/  normal LV function and wall motion,  nuclear stress ef 71%  . CATARACT EXTRACTION W/ INTRAOCULAR LENS IMPLANT Left 09/2016  . COLONOSCOPY  last one 04-24-2016  . HYSTEROSCOPY W/D&C N/A 12/22/2016   Procedure: DILATATION AND CURETTAGE /HYSTEROSCOPY;  Surgeon: Dian Queen, MD;  Location: [facility];  Service: Gynecology;  Laterality: N/A;  . IR US GUIDANCE  09/08/2016  . POSTERIOR LUMBAR FUSION  02/ 2018    [facility] ([place])  . SKIN CANCER EXCISION    . TONSILLECTOMY  child  . 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 TRANSTHORACIC ECHOCARDIOGRAM  01/21/2009   mild LVH, ef 94-58%, grade 1 diastolic dysfunction  . ULNAR NERVE TRANSPOSITION Left 12-20-2008  dr sypher   decompression and partial resection medial triceps fascia  . WIDE LOCAL EXCISION PILONIDAL AREA  02-02-2002  [name]   recurrent squamous cell carcinoma in situ    SOCIAL HISTORY: Social History   Tobacco Use  . Smoking status: Former Smoker    Packs/day: 0.50    Years: 10.00    Pack years: 5.00    Types: Cigarettes    Last attempt to quit:  12/10/1973    Years since quitting: 44.2  . Smokeless tobacco: Never Used  Substance Use Topics  . Alcohol use: Yes    Comment: occasional  . Drug use: No    FAMILY HISTORY: Family History  Problem Relation Age of Onset  . Esophageal cancer Father   . Heart disease Father        Unknown what kind  . Hyperlipidemia Father   . Hypertension Father   . Cancer Father   . Heart failure Father   . Hyperlipidemia Mother   . Hypertension Mother   . CAD Sister  10 years younger than patient - had stent. Was told that her anorexia/bulimia may have played a role  . Stroke Maternal Grandmother 66  . Diabetes Maternal Grandmother   . Heart attack Maternal Grandfather 93  . COPD Maternal Grandfather   . Heart attack Paternal Grandmother        70s  . Diabetes Unknown        MGGM    ROS: Review of Systems  Constitutional: Negative for weight loss.  Gastrointestinal: Negative for nausea and vomiting.  Musculoskeletal:       Negative for muscle weakness.    PHYSICAL EXAM: Blood pressure 126/76, pulse 97, temperature 98.1 F (36.7 C), temperature source Oral, height '5\' 6"'  (1.676 m), weight 194 lb (88 kg), SpO2 99 %. Body mass index is 31.31 kg/m. Physical Exam  Constitutional: She is oriented to person, place, and time. She appears well-developed and well-nourished.  Cardiovascular: Normal rate.  Pulmonary/Chest: Effort normal.  Musculoskeletal: Normal range of motion.  Neurological: She is oriented to person, place, and time.  Skin: Skin is warm and dry.  Psychiatric: She has a normal mood and affect. Her behavior is normal.  Vitals reviewed.   RECENT LABS AND TESTS: BMET    Component Value Date/Time   NA 140 01/06/2018 0919   K 4.6 01/06/2018 0919   CL 100 01/06/2018 0919   CO2 23 01/06/2018 0919   GLUCOSE 101 (H) 01/06/2018 0919   GLUCOSE 147 (H) 12/08/2016 1436   BUN 21 01/06/2018 0919   CREATININE 0.72 01/06/2018 0919   CALCIUM 8.9 01/06/2018 0919    GFRNONAA 83 01/06/2018 0919   GFRAA 95 01/06/2018 0919   Lab Results  Component Value Date   HGBA1C 5.3 01/06/2018   HGBA1C 5.4 09/28/2017   HGBA1C 5.5 06/09/2017   HGBA1C 8.2 (H) 02/23/2017   HGBA1C 5.6 12/08/2016   Lab Results  Component Value Date   INSULIN 24.6 01/06/2018   INSULIN 17.0 09/28/2017   INSULIN 28.3 (H) 06/09/2017   INSULIN 30.7 (H) 02/23/2017   CBC    Component Value Date/Time   WBC 6.2 09/28/2017 0906   WBC 6.8 12/15/2016 1159   RBC 4.07 09/28/2017 0906   RBC 4.23 12/15/2016 1159   HGB 11.8 09/28/2017 0906   HCT 35.4 09/28/2017 0906   PLT 242 12/15/2016 1159   MCV 87 09/28/2017 0906   MCH 29.0 09/28/2017 0906   MCH 30.3 12/15/2016 1159   MCHC 33.3 09/28/2017 0906   MCHC 34.8 12/15/2016 1159   RDW 14.3 09/28/2017 0906   LYMPHSABS 1.5 09/28/2017 0906   MONOABS 0.5 06/04/2016 0808   EOSABS 0.2 09/28/2017 0906   BASOSABS 0.0 09/28/2017 0906   Iron/TIBC/Ferritin/ %Sat    Component Value Date/Time   IRON 109 03/02/2017 1035   TIBC 333 03/19/2015 1448   FERRITIN 218.0 03/02/2017 1035   IRONPCTSAT 15 03/19/2015 1448   Lipid Panel     Component Value Date/Time   CHOL 173 09/28/2017 0906   TRIG 140 09/28/2017 0906   TRIG 101 05/19/2006 0832   HDL 50 09/28/2017 0906   CHOLHDL 4 06/04/2016 0808   VLDL 31.4 06/04/2016 0808   LDLCALC 95 09/28/2017 0906   LDLDIRECT 141.3 09/06/2012 0907   Hepatic Function Panel     Component Value Date/Time   PROT 6.4 01/06/2018 0919   ALBUMIN 4.1 01/06/2018 0919   AST 13 01/06/2018 0919   ALT 17 01/06/2018 0919   ALKPHOS 74 01/06/2018 0919   BILITOT 0.5 01/06/2018 0919  BILIDIR 0.1 12/04/2013 1724      Component Value Date/Time   TSH 4.050 09/28/2017 0906   TSH 4.810 (H) 02/23/2017 1007   TSH 4.22 06/04/2016 0808   Results for ANJA, NEUZIL (MRN [id number]) as of 03/09/2018 06:22  Ref. Range 01/06/2018 09:19  Vitamin D, 25-Hydroxy Latest Ref Range: 30.0 - 100.0 ng/mL 27.8 (L)   ASSESSMENT AND  PLAN: Type 2 diabetes mellitus without complication, without long-term current use of insulin ([place]) - Plan: metFORMIN (GLUCOPHAGE) 500 MG tablet  Vitamin D deficiency - Plan: Vitamin D, Ergocalciferol, (DRISDOL) 50000 units CAPS capsule  Class 1 obesity with serious comorbidity and body mass index (BMI) of 31.0 to 31.9 in adult, unspecified obesity type  PLAN:  Diabetes II Marijah has been given extensive diabetes education by myself today including ideal fasting and post-prandial blood glucose readings, individual ideal Hgb A1c goals, and hypoglycemia prevention. We discussed the importance of good blood sugar control to decrease the likelihood of diabetic complications such as nephropathy, neuropathy, limb loss, blindness, coronary artery disease, and death. We discussed the importance of intensive lifestyle modification including diet, exercise and weight loss as the first line treatment for diabetes. Erin Roach agrees to continue her diet, exercise, and diabetes medications, including metformin 556m 1 PO BID #60 with no refills. She will follow up at the agreed upon time in 3 weeks.  Vitamin D Deficiency PMelikawas informed that low vitamin D levels contributes to fatigue and are associated with obesity, breast, and colon cancer. She agrees to continue to take prescription Vit D '@50' ,000 IU every week #4 with no refills and will follow up for routine testing of vitamin D, at least 2-3 times per year. She was informed of the risk of over-replacement of vitamin D and agrees to not increase her dose unless she discusses this with [name]. PSativaagrees to follow up as directed in 3 weeks.  Obesity PKatelenis currently in the action stage of change. As such, her goal is to continue with weight loss efforts. She has agreed to keep a food journal with 1200 to 1500 calories and 90+ grams of protein. PNesiahhas been instructed to work up to a goal of 150 minutes of combined cardio and strengthening exercise per  week for weight loss and overall health benefits. We discussed the following Behavioral Modification Strategies today: increasing lean protein intake, decreasing simple carbohydrates, work on meal planning and easy cooking plans, and ways to avoid night time snacking.  Erin Roach agreed to follow up with our clinic in 3 weeks. She was informed of the importance of frequent follow up visits to maximize her success with intensive lifestyle modifications for her multiple health conditions.   OBESITY BEHAVIORAL INTERVENTION VISIT  Today's visit was # 20   Starting weight: 223 lbs Starting date: 02/23/17 Today's weight : Weight: 194 lb (88 kg)  Today's date: 03/08/2018 Total lbs lost to date: 29 At least 15 minutes were spent on discussing the following behavioral intervention visit.  ASK: We discussed the diagnosis of obesity with PDelfina Redwoodtoday and PRosiagreed to give uKoreapermission to discuss obesity behavioral modification therapy today.  ASSESS: PYexalenhas the diagnosis of obesity and her BMI today is 31.33 Erin Roach in the action stage of change.   ADVISE: PLashaywas educated on the multiple health risks of obesity as well as the benefit of weight loss to improve her health. She was advised of the need for long term treatment and  the importance of lifestyle modifications to improve her current health and to decrease her risk of future health problems.  AGREE: Multiple dietary modification options and treatment options were discussed and Erin Roach agreed to follow the recommendations documented in the above note.  ARRANGE: Erin Roach was educated on the importance of frequent visits to treat obesity as outlined per CMS and USPSTF guidelines and agreed to schedule her next follow up appointment today.  I, Marcille Blanco, am acting as transcriptionist for Starlyn Skeans, MD  I have reviewed the above documentation for accuracy and completeness, and I agree with the above. -Dennard Nip,  MD

## 2018-03-11 ENCOUNTER — Other Ambulatory Visit (INDEPENDENT_AMBULATORY_CARE_PROVIDER_SITE_OTHER): Payer: Self-pay | Admitting: Family Medicine

## 2018-03-11 DIAGNOSIS — E119 Type 2 diabetes mellitus without complications: Secondary | ICD-10-CM

## 2018-03-20 ENCOUNTER — Other Ambulatory Visit (INDEPENDENT_AMBULATORY_CARE_PROVIDER_SITE_OTHER): Payer: Self-pay | Admitting: Family Medicine

## 2018-03-20 DIAGNOSIS — E559 Vitamin D deficiency, unspecified: Secondary | ICD-10-CM

## 2018-03-27 ENCOUNTER — Other Ambulatory Visit (INDEPENDENT_AMBULATORY_CARE_PROVIDER_SITE_OTHER): Payer: Self-pay | Admitting: Family Medicine

## 2018-03-27 ENCOUNTER — Other Ambulatory Visit: Payer: Self-pay | Admitting: Internal Medicine

## 2018-03-27 DIAGNOSIS — E559 Vitamin D deficiency, unspecified: Secondary | ICD-10-CM

## 2018-03-28 ENCOUNTER — Ambulatory Visit (INDEPENDENT_AMBULATORY_CARE_PROVIDER_SITE_OTHER): Payer: Medicare Other | Admitting: Family Medicine

## 2018-03-28 VITALS — BP 145/77 | HR 99 | Temp 98.2°F | Ht 66.0 in | Wt 197.0 lb

## 2018-03-28 DIAGNOSIS — E119 Type 2 diabetes mellitus without complications: Secondary | ICD-10-CM

## 2018-03-28 DIAGNOSIS — E669 Obesity, unspecified: Secondary | ICD-10-CM | POA: Diagnosis not present

## 2018-03-28 DIAGNOSIS — Z6831 Body mass index (BMI) 31.0-31.9, adult: Secondary | ICD-10-CM

## 2018-03-28 DIAGNOSIS — J3089 Other allergic rhinitis: Secondary | ICD-10-CM

## 2018-03-28 DIAGNOSIS — K141 Geographic tongue: Secondary | ICD-10-CM

## 2018-03-28 DIAGNOSIS — E559 Vitamin D deficiency, unspecified: Secondary | ICD-10-CM | POA: Diagnosis not present

## 2018-03-28 MED ORDER — FLUTICASONE PROPIONATE 50 MCG/ACT NA SUSP: 1.0000 | Freq: Every day | NASAL | 0 refills | Status: DC

## 2018-03-28 MED ORDER — METFORMIN HCL 500 MG PO TABS: ORAL_TABLET | ORAL | 0 refills | Status: DC

## 2018-03-28 MED ORDER — VITAMIN D (ERGOCALCIFEROL) 1.25 MG (50000 UNIT) PO CAPS: 50000.0000 [IU] | ORAL_CAPSULE | ORAL | 0 refills | Status: DC

## 2018-03-28 MED ORDER — LIRAGLUTIDE 18 MG/3ML ~~LOC~~ SOPN: PEN_INJECTOR | SUBCUTANEOUS | 0 refills | Status: DC

## 2018-03-29 ENCOUNTER — Other Ambulatory Visit (INDEPENDENT_AMBULATORY_CARE_PROVIDER_SITE_OTHER): Payer: Self-pay | Admitting: Family Medicine

## 2018-03-29 DIAGNOSIS — E119 Type 2 diabetes mellitus without complications: Secondary | ICD-10-CM

## 2018-03-30 NOTE — Progress Notes (Signed)
Office: [phone]  /  Fax: [phone]   HPI:   Chief Complaint: OBESITY Erin Roach is here to discuss her progress with her obesity treatment plan. She is on the keep a food journal with 1200-1500 calories and 90+ grams of protein daily and is following her eating plan approximately 50 % of the time. She states she is bike riding for 20 minutes 3 times per week and doing weight baring exercises for 12-15 minutes 2-3 times per week. Erin Roach hasn't journaled strictly, mostly doing portion control and smarter food choices but she has increased eating out.  Her weight is 197 lb (89.4 kg) today and has gained 3 pounds since her last visit. She has lost 26 lbs since starting treatment with [name].  Vitamin D Deficiency Erin Roach has a diagnosis of vitamin D deficiency. She is stable on prescription Vit D, but level is not yet at goal. She denies nausea, vomiting or muscle weakness.  Diabetes II Erin Roach has a diagnosis of diabetes type II. Erin Roach states her BGs are starting to increase in the last month with weight gain and increased sweets. She denies any hypoglycemic episodes. Last A1c was 5.3. She has been working on intensive lifestyle modifications including diet, exercise, and weight loss to help control her blood glucose levels.  Allergic Rhinitis Erin Roach has allergic rhinitis and she is stable on Flonase. She denies epistaxis.  Geographic Tongue Erin Roach notes decreased taste and more burning sensation on her tongue, worse in the last month. She notes worse with acidic foods, worse with increased simple carbohydrates and weight gain.  ALLERGIES: No Known Allergies  MEDICATIONS: Current Outpatient Medications on File Prior to Visit  Medication Sig Dispense Refill  . albuterol (PROAIR HFA) 108 (90 Base) MCG/ACT inhaler Inhale 2 puffs into the lungs every 6 (six) hours as needed for wheezing or shortness of breath. 1 Inhaler 2  . Blood Glucose Monitoring Suppl (ONE TOUCH ULTRA 2) w/Device KIT USE TO  TEST BLOOD SUGARS UP TO 4 TIMES DAILY 1 each 0  . budesonide-formoterol (SYMBICORT) 160-4.5 MCG/ACT inhaler Inhale 2 puffs into the lungs daily. 1 Inhaler 1  . Cholecalciferol (VITAMIN D) 2000 units tablet Take 1 tablet (2,000 Units total) by mouth daily.    . furosemide (LASIX) 20 MG tablet TAKE ONE TABLET BY MOUTH DAILY 90 tablet 0  . glucose blood (ACCU-CHEK AVIVA) test strip Testing twice daily 100 each 0  . HYDROcodone-acetaminophen (NORCO/VICODIN) 5-325 MG tablet Take 1 tablet by mouth at bedtime. 30 tablet 0  . HYDROcodone-homatropine (HYCODAN) 5-1.5 MG/5ML syrup Take 5 mLs by mouth every 8 (eight) hours as needed for cough. 120 mL 0  . Insulin Pen Needle (BD PEN NEEDLE NANO 2ND GEN) 32G X 4 MM MISC 1 Package by Does not apply route 2 (two) times daily. 100 each 0  . losartan (COZAAR) 100 MG tablet TAKE ONE TABLET BY MOUTH DAILY 90 tablet 3  . ONETOUCH DELICA LANCETS 23F MISC Use twice daily 100 each 3  . pramipexole (MIRAPEX) 0.25 MG tablet Take 0.5 mg by mouth 2 (two) times daily. 1630  And  Bedtime    . pregabalin (LYRICA) 75 MG capsule TAKE ONE CAPSULE BY MOUTH EVERY AFTERNOON AND TAKE TWO CAPSULES BY MOUTH EVERY NIGHT AT BEDTIME 270 capsule 0  . simvastatin (ZOCOR) 40 MG tablet TAKE ONE TABLET BY MOUTH AT BEDTIME 90 tablet 3  . [DISCONTINUED] VESICARE 10 MG tablet TAKE 1 TABLET EVERY DAY 30 tablet 0   No current facility-administered medications on  file prior to visit.     PAST MEDICAL HISTORY: Past Medical History:  Diagnosis Date  . Allergic rhinitis   . Apnea   . Arthritis   . Asthma    very mild  . Back pain   . Bilateral lower extremity edema   . Carotid artery disease ([place])    a. Carotid duplex 03/2016 - duplex was stable, 81-85% RICA, 6-31% LICA  . Complication of anesthesia    post-op delirium  . Endometrial polyp   . Essential hypertension   . GERD (gastroesophageal reflux disease)   . Heart murmur    per pt  . History of adenomatous polyp of colon     04-24-2016  tubular adenoma  . History of squamous cell carcinoma excision    pilonidal area second excision 02-02-2002  . HTN (hypertension)   . Hyperlipidemia   . Leg pain   . OAB (overactive bladder)   . RA (rheumatoid arthritis) ([place])   . RLS (restless legs syndrome)   . Shortness of breath on exertion   . SUI (stress urinary incontinence, female)   . Venous insufficiency    legs  . Vitamin D deficiency     PAST SURGICAL HISTORY: Past Surgical History:  Procedure Laterality Date  . APPENDECTOMY  07-02-2005   [name]   open  . CARDIAC CATHETERIZATION  01-23-2009  dr Darnell Level brodie   normal coronary arteries and lvf  . CARDIOVASCULAR STRESS TEST  03/31/2016   Low risk nuclear study w/ medium defect of mild severity in basal anterior and mid anterior location w/ no evidence ischemia or infarction/  normal LV function and wall motion,  nuclear stress ef 71%  . CATARACT EXTRACTION W/ INTRAOCULAR LENS IMPLANT Left 09/2016  . COLONOSCOPY  last one 04-24-2016  . HYSTEROSCOPY W/D&C N/A 12/22/2016   Procedure: DILATATION AND CURETTAGE /HYSTEROSCOPY;  Surgeon: Dian Queen, MD;  Location: [facility];  Service: Gynecology;  Laterality: N/A;  . IR US GUIDANCE  09/08/2016  . POSTERIOR LUMBAR FUSION  02/ 2018    [facility] ([place], [place])  . SKIN CANCER EXCISION    . TONSILLECTOMY  child  . 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 TRANSTHORACIC ECHOCARDIOGRAM  01/21/2009   mild LVH, ef 49-70%, grade 1 diastolic dysfunction  . ULNAR NERVE TRANSPOSITION Left 12-20-2008  dr sypher   decompression and partial resection medial triceps fascia  . WIDE LOCAL EXCISION PILONIDAL AREA  02-02-2002  [name]   recurrent squamous cell carcinoma in situ    SOCIAL HISTORY: Social History   Tobacco Use  . Smoking status: Former Smoker     Packs/day: 0.50    Years: 10.00    Pack years: 5.00    Types: Cigarettes    Last attempt to quit: 12/10/1973    Years since quitting: 44.3  . Smokeless tobacco: Never Used  Substance Use Topics  . Alcohol use: Yes    Comment: occasional  . Drug use: No    FAMILY HISTORY: Family History  Problem Relation Age of Onset  . Esophageal cancer Father   . Heart disease Father        Unknown what kind  . Hyperlipidemia Father   . Hypertension Father   . Cancer Father   . Heart failure Father   . Hyperlipidemia Mother   . Hypertension Mother   .  CAD Sister        58 years younger than patient - had stent. Was told that her anorexia/bulimia may have played a role  . Stroke Maternal Grandmother 66  . Diabetes Maternal Grandmother   . Heart attack Maternal Grandfather 93  . COPD Maternal Grandfather   . Heart attack Paternal Grandmother        31s  . Diabetes Unknown        MGGM    ROS: Review of Systems  Constitutional: Negative for weight loss.  HENT: Negative for nosebleeds.   Endo/Heme/Allergies:       Negative hypoglycemia    PHYSICAL EXAM: Blood pressure (!) 145/77, pulse 99, temperature 98.2 F (36.8 C), temperature source Oral, height _0  (1.676 m), weight 197 lb (89.4 kg), SpO2 100 %. Body mass index is 31.8 kg/m. Physical Exam  Constitutional: She is oriented to person, place, and time. She appears well-developed and well-nourished.  Cardiovascular: Normal rate.  Pulmonary/Chest: Effort normal.  Musculoskeletal: Normal range of motion.  Neurological: She is oriented to person, place, and time.  Skin: Skin is warm and dry.  Psychiatric: She has a normal mood and affect. Her behavior is normal.  Vitals reviewed.   RECENT LABS AND TESTS: BMET    Component Value Date/Time   NA 140 01/06/2018 0919   K 4.6 01/06/2018 0919   CL 100 01/06/2018 0919   CO2 23 01/06/2018 0919   GLUCOSE 101 (H) 01/06/2018 0919   GLUCOSE 147 (H) 12/08/2016 1436   BUN 21  01/06/2018 0919   CREATININE 0.72 01/06/2018 0919   CALCIUM 8.9 01/06/2018 0919   GFRNONAA 83 01/06/2018 0919   GFRAA 95 01/06/2018 0919   Lab Results  Component Value Date   HGBA1C 5.3 01/06/2018   HGBA1C 5.4 09/28/2017   HGBA1C 5.5 06/09/2017   HGBA1C 8.2 (H) 02/23/2017   HGBA1C 5.6 12/08/2016   Lab Results  Component Value Date   INSULIN 24.6 01/06/2018   INSULIN 17.0 09/28/2017   INSULIN 28.3 (H) 06/09/2017   INSULIN 30.7 (H) 02/23/2017   CBC    Component Value Date/Time   WBC 6.2 09/28/2017 0906   WBC 6.8 12/15/2016 1159   RBC 4.07 09/28/2017 0906   RBC 4.23 12/15/2016 1159   HGB 11.8 09/28/2017 0906   HCT 35.4 09/28/2017 0906   PLT 242 12/15/2016 1159   MCV 87 09/28/2017 0906   MCH 29.0 09/28/2017 0906   MCH 30.3 12/15/2016 1159   MCHC 33.3 09/28/2017 0906   MCHC 34.8 12/15/2016 1159   RDW 14.3 09/28/2017 0906   LYMPHSABS 1.5 09/28/2017 0906   MONOABS 0.5 06/04/2016 0808   EOSABS 0.2 09/28/2017 0906   BASOSABS 0.0 09/28/2017 0906   Iron/TIBC/Ferritin/ %Sat    Component Value Date/Time   IRON 109 03/02/2017 1035   TIBC 333 03/19/2015 1448   FERRITIN 218.0 03/02/2017 1035   IRONPCTSAT 15 03/19/2015 1448   Lipid Panel     Component Value Date/Time   CHOL 173 09/28/2017 0906   TRIG 140 09/28/2017 0906   TRIG 101 05/19/2006 0832   HDL 50 09/28/2017 0906   CHOLHDL 4 06/04/2016 0808   VLDL 31.4 06/04/2016 0808   LDLCALC 95 09/28/2017 0906   LDLDIRECT 141.3 09/06/2012 0907   Hepatic Function Panel     Component Value Date/Time   PROT 6.4 01/06/2018 0919   ALBUMIN 4.1 01/06/2018 0919   AST 13 01/06/2018 0919   ALT 17 01/06/2018 0919   ALKPHOS 74 01/06/2018  0919   BILITOT 0.5 01/06/2018 0919   BILIDIR 0.1 12/04/2013 1724      Component Value Date/Time   TSH 4.050 09/28/2017 0906   TSH 4.810 (H) 02/23/2017 1007   TSH 4.22 06/04/2016 0808  Results for ALANIE, SYLER (MRN [id number]) as of 03/30/2018 10:43  Ref. Range 01/06/2018 09:19  Vitamin  D, 25-Hydroxy Latest Ref Range: 30.0 - 100.0 ng/mL 27.8 (L)    ASSESSMENT AND PLAN: Vitamin D deficiency - Plan: Vitamin D, Ergocalciferol, (DRISDOL) 1.25 MG (50000 UT) CAPS capsule  Type 2 diabetes mellitus without complication, without long-term current use of insulin (HCC) - Plan: liraglutide (VICTOZA) 18 MG/3ML SOPN, metFORMIN (GLUCOPHAGE) 500 MG tablet  Seasonal allergic rhinitis due to other allergic trigger - Plan: fluticasone (FLONASE) 50 MCG/ACT nasal spray  Geographic tongue  Class 1 obesity with serious comorbidity and body mass index (BMI) of 31.0 to 31.9 in adult, unspecified obesity type  PLAN:  Vitamin D Deficiency Erin Roach was informed that low vitamin D levels contributes to fatigue and are associated with obesity, breast, and colon cancer. Erin Roach agrees to continue taking prescription Vit D _0 ,000 IU every week #4 and we will refill for 1 month. She will follow up for routine testing of vitamin D, at least 2-3 times per year. She was informed of the risk of over-replacement of vitamin D and agrees to not increase her dose unless she discusses this with [name] first. We will recheck labs at next visit. Erin Roach agrees to follow up with our clinic in 3 weeks.  Diabetes II Erin Roach has been given extensive diabetes education by myself today including ideal fasting and post-prandial blood glucose readings, individual ideal Hgb A1c goals and hypoglycemia prevention. We discussed the importance of good blood sugar control to decrease the likelihood of diabetic complications such as nephropathy, neuropathy, limb loss, blindness, coronary artery disease, and death. We discussed the importance of intensive lifestyle modification including diet, exercise and weight loss as the first line treatment for diabetes. Erin Roach agrees to continue taking metformin 500 mg BID #60 and we will refill for 1 month, and she agrees to continue Victoza 1.8 mg SubQ q AM #3 pens and we will refill for 1 month. We will  recheck labs at next visit. Takerra agrees to follow up with our clinic in 3 weeks.  Allergic Rhinitis Erin Roach agrees to continue Flonase 50 mcg 1 spray into both nostril qd and we will refill for 1 month. Erin Roach agrees to follow up with our clinic in 3 weeks.  Geographic Tongue Erin Roach is to continue vitamins and increase H20 intake, avoid spicy or acidic foods, and we will recheck labs in 3 weeks if no improvement. Erin Roach agrees to follow up with our clinic in 3 weeks.  Obesity Erin Roach is currently in the action stage of change. As such, her goal is to continue with weight loss efforts She has agreed to portion control better and make smarter food choices, such as increase vegetables and decrease simple carbohydrates  Erin Roach has been instructed to work up to a goal of 150 minutes of combined cardio and strengthening exercise per week for weight loss and overall health benefits. We discussed the following Behavioral Modification Strategies today: decreasing simple carbohydrates , increasing vegetables and holiday eating strategies    Erin Roach has agreed to follow up with our clinic in 3 weeks. She was informed of the importance of frequent follow up visits to maximize her success with intensive lifestyle modifications for her multiple health conditions.  OBESITY BEHAVIORAL INTERVENTION VISIT  Today's visit was # 21   Starting weight: 223 lbs Starting date: 02/23/17 Today's weight : 197 lbs Today's date: 03/28/2018 Total lbs lost to date: 26 At least 15 minutes were spent on discussing the following behavioral intervention visit.   ASK: We discussed the diagnosis of obesity with Erin Roach today and Erin Roach agreed to give [name] permission to discuss obesity behavioral modification therapy today.  ASSESS: Erin Roach has the diagnosis of obesity and her BMI today is 31.81 Erin Roach is in the action stage of change   ADVISE: Erin Roach was educated on the multiple health risks of obesity as well as the  benefit of weight loss to improve her health. She was advised of the need for long term treatment and the importance of lifestyle modifications to improve her current health and to decrease her risk of future health problems.  AGREE: Multiple dietary modification options and treatment options were discussed and  Erin Roach agreed to follow the recommendations documented in the above note.  ARRANGE: Erin Roach was educated on the importance of frequent visits to treat obesity as outlined per CMS and USPSTF guidelines and agreed to schedule her next follow up appointment today.  I, Trixie Dredge, am acting as transcriptionist for Dennard Nip, MD  I have reviewed the above documentation for accuracy and completeness, and I agree with the above. -Dennard Nip, MD

## 2018-04-04 ENCOUNTER — Telehealth: Payer: Self-pay | Admitting: Internal Medicine

## 2018-04-04 MED ORDER — HYDROCODONE-ACETAMINOPHEN 5-325 MG PO TABS: 1.0000 | ORAL_TABLET | Freq: Every day | ORAL | 0 refills | Status: DC

## 2018-04-04 NOTE — Telephone Encounter (Signed)
Check [place] registry last filled 03/04/2018.Marland KitchenJohny Roach

## 2018-04-04 NOTE — Addendum Note (Signed)
Addended by: Binnie Rail on: 04/04/2018 04:56 PM   Modules accepted: Orders

## 2018-04-04 NOTE — Telephone Encounter (Signed)
Pt called for a refill of her  HYDROcodone-acetaminophen (NORCO/VICODIN) 5-325 MG tablet   Pt stated pharmacy sent in a request for this on the 21st but we never received it. Please advise on refill

## 2018-04-04 NOTE — Telephone Encounter (Signed)
Sent to [facility] on [address]  - not sure if it needed to go someplace else....Marland KitchenMarland Kitchen

## 2018-04-05 NOTE — Telephone Encounter (Signed)
Called pt no answer LMOM rx sent to [facility].Marland KitchenJohny Roach

## 2018-04-14 ENCOUNTER — Other Ambulatory Visit (INDEPENDENT_AMBULATORY_CARE_PROVIDER_SITE_OTHER): Payer: Self-pay

## 2018-04-14 DIAGNOSIS — E119 Type 2 diabetes mellitus without complications: Secondary | ICD-10-CM

## 2018-04-14 MED ORDER — GLUCOSE BLOOD VI STRP: ORAL_STRIP | 0 refills | Status: DC

## 2018-04-18 ENCOUNTER — Other Ambulatory Visit (INDEPENDENT_AMBULATORY_CARE_PROVIDER_SITE_OTHER): Payer: Self-pay | Admitting: Family Medicine

## 2018-04-18 ENCOUNTER — Telehealth (INDEPENDENT_AMBULATORY_CARE_PROVIDER_SITE_OTHER): Payer: Self-pay | Admitting: Family Medicine

## 2018-04-18 DIAGNOSIS — E559 Vitamin D deficiency, unspecified: Secondary | ICD-10-CM

## 2018-04-18 NOTE — Telephone Encounter (Signed)
Patient states she is out of her Vit D and Metformin.  She takes her Vit D on Mondays.  Patient is scheduled to be seen Wed, 12/11.  [facility] is [facility] [address].  She is not able to access My Chart at this time.  Please advise. Thank you

## 2018-04-18 NOTE — Telephone Encounter (Signed)
Spoke with the patient and her pharmacy. She will be able to pick up her Metformin today as that was on hold and her Vit D will be refilled at her visit on 04/20/18. Erin Roach, [place]

## 2018-04-20 ENCOUNTER — Ambulatory Visit (INDEPENDENT_AMBULATORY_CARE_PROVIDER_SITE_OTHER): Payer: Medicare Other | Admitting: Family Medicine

## 2018-04-20 ENCOUNTER — Encounter (INDEPENDENT_AMBULATORY_CARE_PROVIDER_SITE_OTHER): Payer: Self-pay | Admitting: Family Medicine

## 2018-04-20 VITALS — BP 118/78 | HR 99 | Ht 66.0 in | Wt 198.0 lb

## 2018-04-20 DIAGNOSIS — E559 Vitamin D deficiency, unspecified: Secondary | ICD-10-CM | POA: Diagnosis not present

## 2018-04-20 DIAGNOSIS — E1129 Type 2 diabetes mellitus with other diabetic kidney complication: Secondary | ICD-10-CM | POA: Diagnosis not present

## 2018-04-20 DIAGNOSIS — E669 Obesity, unspecified: Secondary | ICD-10-CM

## 2018-04-20 DIAGNOSIS — R809 Proteinuria, unspecified: Secondary | ICD-10-CM

## 2018-04-20 DIAGNOSIS — Z6832 Body mass index (BMI) 32.0-32.9, adult: Secondary | ICD-10-CM | POA: Diagnosis not present

## 2018-04-20 MED ORDER — METFORMIN HCL 500 MG PO TABS: ORAL_TABLET | ORAL | 0 refills | Status: DC

## 2018-04-20 MED ORDER — VITAMIN D (ERGOCALCIFEROL) 1.25 MG (50000 UNIT) PO CAPS: 50000.0000 [IU] | ORAL_CAPSULE | ORAL | 0 refills | Status: DC

## 2018-04-20 MED ORDER — LIRAGLUTIDE 18 MG/3ML ~~LOC~~ SOPN: PEN_INJECTOR | SUBCUTANEOUS | 0 refills | Status: DC

## 2018-04-20 NOTE — Progress Notes (Signed)
Office: [phone]  /  Fax: [phone]   HPI:   Chief Complaint: OBESITY Erin Roach is here to discuss her progress with her obesity treatment plan. She is keeping a food journal with 1500 calories and 80 grams of protein and is following her eating plan approximately 50 % of the time. She states she is walking and riding a bike 15 minutes 4 to 5 times per week. Erin Roach has done well maintaining weight over the holidays. She notes increased temptations and celebration eating, but has been trying to do portion control and smarter choices.  Her weight is 198 lb (89.8 kg) today and has had a weight gain of 1 pound over a period of 3 weeks since her last visit. She has lost 25 lbs since starting treatment with [name].  Vitamin D deficiency Erin Roach has a diagnosis of vitamin D deficiency. She is currently taking vit D and is stable. She is due for labs. She denies nausea, vomiting, or muscle weakness.  Diabetes II Erin Roach has a diagnosis of diabetes type II. Erin Roach does not have her blood sugar log today. She admits to decreased polyphagia and denies nausea, vomiting, and hypoglycemic episodes. Last A1c was 5.3 on 01/06/18 and was well controlled. She has been working on intensive lifestyle modifications including diet, exercise, and weight loss to help control her blood glucose levels.  ALLERGIES: No Known Allergies  MEDICATIONS: Current Outpatient Medications on File Prior to Visit  Medication Sig Dispense Refill  . albuterol (PROAIR HFA) 108 (90 Base) MCG/ACT inhaler Inhale 2 puffs into the lungs every 6 (six) hours as needed for wheezing or shortness of breath. 1 Inhaler 2  . Blood Glucose Monitoring Suppl (ONE TOUCH ULTRA 2) w/Device KIT USE TO TEST BLOOD SUGARS UP TO 4 TIMES DAILY 1 each 0  . budesonide-formoterol (SYMBICORT) 160-4.5 MCG/ACT inhaler Inhale 2 puffs into the lungs daily. 1 Inhaler 1  . Cholecalciferol (VITAMIN D) 2000 units tablet Take 1 tablet (2,000 Units total) by mouth daily.      . fluticasone (FLONASE) 50 MCG/ACT nasal spray Place 1 spray into both nostrils daily. 16 g 0  . furosemide (LASIX) 20 MG tablet TAKE ONE TABLET BY MOUTH DAILY 90 tablet 0  . glucose blood (ACCU-CHEK AVIVA) test strip Testing twice daily 100 each 0  . HYDROcodone-acetaminophen (NORCO/VICODIN) 5-325 MG tablet Take 1 tablet by mouth at bedtime. 30 tablet 0  . Insulin Pen Needle (BD PEN NEEDLE NANO 2ND GEN) 32G X 4 MM MISC 1 Package by Does not apply route 2 (two) times daily. 100 each 0  . losartan (COZAAR) 100 MG tablet TAKE ONE TABLET BY MOUTH DAILY 90 tablet 3  . ONETOUCH DELICA LANCETS 52W MISC Use twice daily 100 each 3  . pramipexole (MIRAPEX) 0.25 MG tablet Take 0.5 mg by mouth 2 (two) times daily. 1630  And  Bedtime    . pregabalin (LYRICA) 75 MG capsule TAKE ONE CAPSULE BY MOUTH EVERY AFTERNOON AND TAKE TWO CAPSULES BY MOUTH EVERY NIGHT AT BEDTIME 270 capsule 0  . simvastatin (ZOCOR) 40 MG tablet TAKE ONE TABLET BY MOUTH AT BEDTIME 90 tablet 3  . [DISCONTINUED] VESICARE 10 MG tablet TAKE 1 TABLET EVERY DAY 30 tablet 0   No current facility-administered medications on file prior to visit.     PAST MEDICAL HISTORY: Past Medical History:  Diagnosis Date  . Allergic rhinitis   . Apnea   . Arthritis   . Asthma    very mild  . Back  pain   . Bilateral lower extremity edema   . Carotid artery disease (HCC)    a. Carotid duplex 03/2016 - duplex was stable, 40-59% RICA, 1-39% LICA  . Complication of anesthesia    post-op delirium  . Endometrial polyp   . Essential hypertension   . GERD (gastroesophageal reflux disease)   . Heart murmur    per pt  . History of adenomatous polyp of colon    04-24-2016  tubular adenoma  . History of squamous cell carcinoma excision    pilonidal area second excision 02-02-2002  . HTN (hypertension)   . Hyperlipidemia   . Leg pain   . OAB (overactive bladder)   . RA (rheumatoid arthritis) (HCC)   . RLS (restless legs syndrome)   . Shortness of  breath on exertion   . SUI (stress urinary incontinence, female)   . Venous insufficiency    legs  . Vitamin D deficiency     PAST SURGICAL HISTORY: Past Surgical History:  Procedure Laterality Date  . APPENDECTOMY  07-02-2005   dr weatherly   open  . CARDIAC CATHETERIZATION  01-23-2009  dr bruce brodie   normal coronary arteries and lvf  . CARDIOVASCULAR STRESS TEST  03/31/2016   Low risk nuclear study w/ medium defect of mild severity in basal anterior and mid anterior location w/ no evidence ischemia or infarction/  normal LV function and wall motion,  nuclear stress ef 71%  . CATARACT EXTRACTION W/ INTRAOCULAR LENS IMPLANT Left 09/2016  . COLONOSCOPY  last one 04-24-2016  . HYSTEROSCOPY W/D&C N/A 12/22/2016   Procedure: DILATATION AND CURETTAGE /HYSTEROSCOPY;  Surgeon: Grewal, Michelle, MD;  Location: [facility];  Service: Gynecology;  Laterality: N/A;  . IR US GUIDANCE  09/08/2016  . POSTERIOR LUMBAR FUSION  02/ 2018    [facility] ([place])  . SKIN CANCER EXCISION    . TONSILLECTOMY  child  . TOTAL KNEE ARTHROPLASTY Left 04/09/2015   Procedure: LEFT TOTAL KNEE ARTHROPLASTY;  Surgeon: [name] Olin, MD;  Location: [facility];  Service: Orthopedics;  Laterality: Left;  . TRANSTHORACIC ECHOCARDIOGRAM  01/21/2009   mild LVH, ef 75-80%, grade 1 diastolic dysfunction  . ULNAR NERVE TRANSPOSITION Left 12-20-2008  dr sypher   decompression and partial resection medial triceps fascia  . WIDE LOCAL EXCISION PILONIDAL AREA  02-02-2002  dr weatherly   recurrent squamous cell carcinoma in situ    SOCIAL HISTORY: Social History   Tobacco Use  . Smoking status: Former Smoker    Packs/day: 0.50    Years: 10.00    Pack years: 5.00    Types: Cigarettes    Last attempt to quit: 12/10/1973    Years since quitting: 44.3  . Smokeless tobacco: Never Used  Substance Use Topics  . Alcohol use: Yes    Comment: occasional  . Drug use: No    FAMILY HISTORY: Family  History  Problem Relation Age of Onset  . Esophageal cancer Father   . Heart disease Father        Unknown what kind  . Hyperlipidemia Father   . Hypertension Father   . Cancer Father   . Heart failure Father   . Hyperlipidemia Mother   . Hypertension Mother   . CAD Sister        10 years younger than patient - had stent. Was told that her anorexia/bulimia may have played a role  . Stroke Maternal Grandmother 66  . Diabetes Maternal Grandmother   .   Heart attack Maternal Grandfather 93  . COPD Maternal Grandfather   . Heart attack Paternal Grandmother        70s  . Diabetes Unknown        MGGM    ROS: Review of Systems  Constitutional: Negative for weight loss.  Gastrointestinal: Negative for nausea and vomiting.  Musculoskeletal:       Negative for muscle weakness.  Endo/Heme/Allergies:       Negative for hypoglycemia. Positive for polyphagia.    PHYSICAL EXAM: Blood pressure 118/78, pulse 99, height 5' 6" (1.676 m), weight 198 lb (89.8 kg), SpO2 100 %. Body mass index is 31.96 kg/m. Physical Exam  Constitutional: She is oriented to person, place, and time. She appears well-developed and well-nourished.  Cardiovascular: Normal rate.  Pulmonary/Chest: Effort normal.  Musculoskeletal: Normal range of motion.  Neurological: She is oriented to person, place, and time.  Skin: Skin is warm and dry.  Psychiatric: She has a normal mood and affect. Her behavior is normal.  Vitals reviewed.   RECENT LABS AND TESTS: BMET    Component Value Date/Time   NA 140 01/06/2018 0919   K 4.6 01/06/2018 0919   CL 100 01/06/2018 0919   CO2 23 01/06/2018 0919   GLUCOSE 101 (H) 01/06/2018 0919   GLUCOSE 147 (H) 12/08/2016 1436   BUN 21 01/06/2018 0919   CREATININE 0.72 01/06/2018 0919   CALCIUM 8.9 01/06/2018 0919   GFRNONAA 83 01/06/2018 0919   GFRAA 95 01/06/2018 0919   Lab Results  Component Value Date   HGBA1C 5.3 01/06/2018   HGBA1C 5.4 09/28/2017   HGBA1C 5.5  06/09/2017   HGBA1C 8.2 (H) 02/23/2017   HGBA1C 5.6 12/08/2016   Lab Results  Component Value Date   INSULIN 24.6 01/06/2018   INSULIN 17.0 09/28/2017   INSULIN 28.3 (H) 06/09/2017   INSULIN 30.7 (H) 02/23/2017   CBC    Component Value Date/Time   WBC 6.2 09/28/2017 0906   WBC 6.8 12/15/2016 1159   RBC 4.07 09/28/2017 0906   RBC 4.23 12/15/2016 1159   HGB 11.8 09/28/2017 0906   HCT 35.4 09/28/2017 0906   PLT 242 12/15/2016 1159   MCV 87 09/28/2017 0906   MCH 29.0 09/28/2017 0906   MCH 30.3 12/15/2016 1159   MCHC 33.3 09/28/2017 0906   MCHC 34.8 12/15/2016 1159   RDW 14.3 09/28/2017 0906   LYMPHSABS 1.5 09/28/2017 0906   MONOABS 0.5 06/04/2016 0808   EOSABS 0.2 09/28/2017 0906   BASOSABS 0.0 09/28/2017 0906   Iron/TIBC/Ferritin/ %Sat    Component Value Date/Time   IRON 109 03/02/2017 1035   TIBC 333 03/19/2015 1448   FERRITIN 218.0 03/02/2017 1035   IRONPCTSAT 15 03/19/2015 1448   Lipid Panel     Component Value Date/Time   CHOL 173 09/28/2017 0906   TRIG 140 09/28/2017 0906   TRIG 101 05/19/2006 0832   HDL 50 09/28/2017 0906   CHOLHDL 4 06/04/2016 0808   VLDL 31.4 06/04/2016 0808   LDLCALC 95 09/28/2017 0906   LDLDIRECT 141.3 09/06/2012 0907   Hepatic Function Panel     Component Value Date/Time   PROT 6.4 01/06/2018 0919   ALBUMIN 4.1 01/06/2018 0919   AST 13 01/06/2018 0919   ALT 17 01/06/2018 0919   ALKPHOS 74 01/06/2018 0919   BILITOT 0.5 01/06/2018 0919   BILIDIR 0.1 12/04/2013 1724      Component Value Date/Time   TSH 4.050 09/28/2017 0906   TSH 4.810 (H) 02/23/2017   1007   TSH 4.22 06/04/2016 0808   Results for ANALISE, GLOTFELTY (MRN [id number]) as of 04/20/2018 12:01  Ref. Range 01/06/2018 09:19  Vitamin D, 25-Hydroxy Latest Ref Range: 30.0 - 100.0 ng/mL 27.8 (L)   ASSESSMENT AND PLAN: Vitamin D deficiency - Plan: VITAMIN D 25 Hydroxy (Vit-D Deficiency, Fractures), Vitamin D, Ergocalciferol, (DRISDOL) 1.25 MG (50000 UT) CAPS  capsule  Type 2 diabetes mellitus with microalbuminuria, without long-term current use of insulin (HCC) - Plan: Comprehensive metabolic panel, Hemoglobin A1c, Insulin, random, Lipid Panel With LDL/HDL Ratio, Prealbumin, metFORMIN (GLUCOPHAGE) 500 MG tablet, liraglutide (VICTOZA) 18 MG/3ML SOPN  Class 1 obesity with serious comorbidity and body mass index (BMI) of 32.0 to 32.9 in adult, unspecified obesity type  PLAN:  Vitamin D Deficiency Erin Roach was informed that low vitamin D levels contributes to fatigue and are associated with obesity, breast, and colon cancer. She agrees to continue to take prescription Vit D _0 ,000 IU every week #4 with no refills and will follow up for routine testing of vitamin D, at least 2-3 times per year. She was informed of the risk of over-replacement of vitamin D and agrees to not increase her dose unless she discusses this with [name] first. Erin Roach agrees to follow up in 3 to 4 weeks.  Diabetes II Erin Roach has been given extensive diabetes education by myself today including ideal fasting and post-prandial blood glucose readings, individual ideal Hgb A1c goals. and hypoglycemia prevention. We discussed the importance of good blood sugar control to decrease the likelihood of diabetic complications such as nephropathy, neuropathy, limb loss, blindness, coronary artery disease, and death. We discussed the importance of intensive lifestyle modification including diet, exercise and weight loss as the first line treatment for diabetes. Erin Roach agrees to continue her Victoza 1.76m qAM #3 pens and metformin 5013mBID #60 with no refills. Erin Roach follow up at the agreed upon time.  Obesity Erin Roach currently in the action stage of change. As such, her goal is to continue with weight loss efforts. She has agreed to keep a food journal with 1500 calories and 80 grams of protein.  Erin Roach been instructed to work up to a goal of 150 minutes of combined cardio and strengthening  exercise per week for weight loss and overall health benefits. We discussed the following Behavioral Modification Strategies today: travel eating strategies, holiday eating strategies, and celebration eating. strategies  Erin Roach agreed to follow up with our clinic in 3 to 4 weeks. She was informed of the importance of frequent follow up visits to maximize her success with intensive lifestyle modifications for her multiple health conditions.   OBESITY BEHAVIORAL INTERVENTION VISIT  Today's visit was # 22  Starting weight: 223 lbs Starting date: 02/23/17 Today's weight : Weight: 198 lb (89.8 kg)  Today's date: 04/20/2018 Total lbs lost to date: 25 At least 15 minutes were spent on discussing the following behavioral intervention visit.  ASK: We discussed the diagnosis of obesity with Erin Roach and Erin Roach to give [place] to discuss obesity behavioral modification therapy today.  ASSESS: Erin Roach the diagnosis of obesity and her BMI today is 31.9. Erin Roach in the action stage of change.   ADVISE: Erin Roach educated on the multiple health risks of obesity as well as the benefit of weight loss to improve her health. She was advised of the need for long term treatment and the importance of lifestyle modifications to improve her current health and to decrease her  risk of future health problems.  AGREE: Multiple dietary modification options and treatment options were discussed and Briannia agreed to follow the recommendations documented in the above note.  ARRANGE: Charlita was educated on the importance of frequent visits to treat obesity as outlined per CMS and USPSTF guidelines and agreed to schedule her next follow up appointment today.  I, Marcille Blanco, am acting as transcriptionist for Thomes Dinning, MD  I have reviewed the above documentation for accuracy and completeness, and I agree with the above. -Dennard Nip, MD

## 2018-04-21 LAB — COMPREHENSIVE METABOLIC PANEL
A/G RATIO: 1.8 (ref 1.2–2.2)
ALBUMIN: 4.2 g/dL (ref 3.5–4.8)
ALT: 20 IU/L (ref 0–32)
AST: 16 IU/L (ref 0–40)
Alkaline Phosphatase: 88 IU/L (ref 39–117)
BUN/Creatinine Ratio: 20 (ref 12–28)
BUN: 15 mg/dL (ref 8–27)
Bilirubin Total: 0.3 mg/dL (ref 0.0–1.2)
CO2: 22 mmol/L (ref 20–29)
Calcium: 9 mg/dL (ref 8.7–10.3)
Chloride: 101 mmol/L (ref 96–106)
Creatinine, Ser: 0.74 mg/dL (ref 0.57–1.00)
GFR calc Af Amer: 92 mL/min/{1.73_m2} (ref >59)
GFR, EST NON AFRICAN AMERICAN: 80 mL/min/{1.73_m2} (ref >59)
GLUCOSE: 97 mg/dL (ref 65–99)
Globulin, Total: 2.3 g/dL (ref 1.5–4.5)
POTASSIUM: 4.5 mmol/L (ref 3.5–5.2)
Sodium: 140 mmol/L (ref 134–144)
Total Protein: 6.5 g/dL (ref 6.0–8.5)

## 2018-04-21 LAB — LIPID PANEL WITH LDL/HDL RATIO
Cholesterol, Total: 226 mg/dL — ABNORMAL HIGH (ref 100–199)
HDL: 54 mg/dL (ref >39)
LDL Calculated: 129 mg/dL — ABNORMAL HIGH (ref 0–99)
LDl/HDL Ratio: 2.4 ratio (ref 0.0–3.2)
Triglycerides: 216 mg/dL — ABNORMAL HIGH (ref 0–149)
VLDL Cholesterol Cal: 43 mg/dL — ABNORMAL HIGH (ref 5–40)

## 2018-04-21 LAB — HEMOGLOBIN A1C
Est. average glucose Bld gHb Est-mCnc: 117 mg/dL
Hgb A1c MFr Bld: 5.7 % — ABNORMAL HIGH (ref 4.8–5.6)

## 2018-04-21 LAB — INSULIN, RANDOM: INSULIN: 27.9 u[IU]/mL — ABNORMAL HIGH (ref 2.6–24.9)

## 2018-04-21 LAB — PREALBUMIN: PREALBUMIN: 33 mg/dL — ABNORMAL HIGH (ref 9–32)

## 2018-04-21 LAB — VITAMIN D 25 HYDROXY (VIT D DEFICIENCY, FRACTURES): Vit D, 25-Hydroxy: 46.7 ng/mL (ref 30.0–100.0)

## 2018-05-05 ENCOUNTER — Other Ambulatory Visit: Payer: Self-pay | Admitting: Internal Medicine

## 2018-05-05 MED ORDER — HYDROCODONE-ACETAMINOPHEN 5-325 MG PO TABS: 1.0000 | ORAL_TABLET | Freq: Every day | ORAL | 0 refills | Status: DC

## 2018-05-09 ENCOUNTER — Other Ambulatory Visit (INDEPENDENT_AMBULATORY_CARE_PROVIDER_SITE_OTHER): Payer: Self-pay | Admitting: Family Medicine

## 2018-05-09 DIAGNOSIS — E119 Type 2 diabetes mellitus without complications: Secondary | ICD-10-CM

## 2018-05-09 DIAGNOSIS — R809 Proteinuria, unspecified: Secondary | ICD-10-CM

## 2018-05-09 DIAGNOSIS — E1129 Type 2 diabetes mellitus with other diabetic kidney complication: Secondary | ICD-10-CM

## 2018-05-10 ENCOUNTER — Encounter (INDEPENDENT_AMBULATORY_CARE_PROVIDER_SITE_OTHER): Payer: Self-pay | Admitting: Family Medicine

## 2018-05-13 ENCOUNTER — Encounter (INDEPENDENT_AMBULATORY_CARE_PROVIDER_SITE_OTHER): Payer: Self-pay | Admitting: Family Medicine

## 2018-05-15 ENCOUNTER — Encounter (INDEPENDENT_AMBULATORY_CARE_PROVIDER_SITE_OTHER): Payer: Self-pay | Admitting: Family Medicine

## 2018-05-16 NOTE — Telephone Encounter (Signed)
Please address. Thanks!

## 2018-05-18 ENCOUNTER — Other Ambulatory Visit (INDEPENDENT_AMBULATORY_CARE_PROVIDER_SITE_OTHER): Payer: Self-pay | Admitting: Family Medicine

## 2018-05-18 ENCOUNTER — Ambulatory Visit (INDEPENDENT_AMBULATORY_CARE_PROVIDER_SITE_OTHER): Payer: Medicare Other | Admitting: Family Medicine

## 2018-05-18 DIAGNOSIS — E559 Vitamin D deficiency, unspecified: Secondary | ICD-10-CM

## 2018-05-18 NOTE — Telephone Encounter (Signed)
Please address

## 2018-05-19 ENCOUNTER — Other Ambulatory Visit (INDEPENDENT_AMBULATORY_CARE_PROVIDER_SITE_OTHER): Payer: Self-pay

## 2018-05-19 DIAGNOSIS — E119 Type 2 diabetes mellitus without complications: Secondary | ICD-10-CM

## 2018-05-19 MED ORDER — INSULIN PEN NEEDLE 32G X 4 MM MISC: 1.0000 | Freq: Two times a day (BID) | 0 refills | Status: DC

## 2018-05-19 NOTE — Telephone Encounter (Signed)
Spoke with the patient who states she only needs the nano needles. Rx sent in for needles.   Mechell Girgis, [place]

## 2018-05-25 ENCOUNTER — Ambulatory Visit (INDEPENDENT_AMBULATORY_CARE_PROVIDER_SITE_OTHER): Payer: Medicare Other | Admitting: Family Medicine

## 2018-05-30 ENCOUNTER — Encounter (INDEPENDENT_AMBULATORY_CARE_PROVIDER_SITE_OTHER): Payer: Self-pay | Admitting: Family Medicine

## 2018-05-30 ENCOUNTER — Ambulatory Visit (INDEPENDENT_AMBULATORY_CARE_PROVIDER_SITE_OTHER): Payer: Medicare Other | Admitting: Family Medicine

## 2018-05-30 VITALS — BP 138/80 | HR 113 | Temp 97.9°F | Ht 66.0 in | Wt 199.0 lb

## 2018-05-30 DIAGNOSIS — E559 Vitamin D deficiency, unspecified: Secondary | ICD-10-CM

## 2018-05-30 DIAGNOSIS — Z6832 Body mass index (BMI) 32.0-32.9, adult: Secondary | ICD-10-CM | POA: Diagnosis not present

## 2018-05-30 DIAGNOSIS — J3089 Other allergic rhinitis: Secondary | ICD-10-CM | POA: Diagnosis not present

## 2018-05-30 DIAGNOSIS — E1129 Type 2 diabetes mellitus with other diabetic kidney complication: Secondary | ICD-10-CM | POA: Diagnosis not present

## 2018-05-30 DIAGNOSIS — E669 Obesity, unspecified: Secondary | ICD-10-CM

## 2018-05-30 DIAGNOSIS — R809 Proteinuria, unspecified: Secondary | ICD-10-CM

## 2018-05-30 MED ORDER — LIRAGLUTIDE 18 MG/3ML ~~LOC~~ SOPN: PEN_INJECTOR | SUBCUTANEOUS | 0 refills | Status: DC

## 2018-05-30 MED ORDER — METFORMIN HCL 500 MG PO TABS: ORAL_TABLET | ORAL | 0 refills | Status: DC

## 2018-05-30 MED ORDER — VITAMIN D (ERGOCALCIFEROL) 1.25 MG (50000 UNIT) PO CAPS: 50000.0000 [IU] | ORAL_CAPSULE | ORAL | 0 refills | Status: DC

## 2018-05-30 MED ORDER — FLUTICASONE PROPIONATE 50 MCG/ACT NA SUSP: 1.0000 | Freq: Every day | NASAL | 0 refills | Status: DC

## 2018-05-31 NOTE — Progress Notes (Signed)
Office: [phone]  /  Fax: [phone]   HPI:   Chief Complaint: OBESITY Erin Roach is here to discuss her progress with her obesity treatment plan. She is keeping a food journal with 1500 calories and 80 grams of protein  and is following her eating plan approximately 40 % of the time. She states she is walking and using stationary bike 20 minutes 3 to 4 times per week. Erin Roach spent the holidays with family and got away from strict journaling. She was mostly portion control and smarter choices, but is now ready to get back on track.  Her weight is 199 lb (90.3 kg) today and has had a weight gain of 1 pound over a period of 6 weeks since her last visit. She has lost 24 lbs since starting treatment with [name].  Diabetes II Erin Roach has a diagnosis of diabetes type II. Tameah did not bring her blood sugar log and she denies any hypoglycemic episodes. Her last A1c was 5.7 on 04/20/18 and well controlled. She has been working on intensive lifestyle modifications including diet, exercise, and weight loss to help control her blood glucose levels.  Vitamin D deficiency Erin Roach has a diagnosis of vitamin D deficiency. She is currently taking prescription and OTC vit D and is almost at goal. She denies nausea, vomiting, or muscle weakness.  Allergic Rhinitis Erin Roach is stable on Flonase and she denies side effects. Erin Roach requests a refill of Flonase.  ASSESSMENT AND PLAN:  Type 2 diabetes mellitus with microalbuminuria, without long-term current use of insulin ([place]) - Plan: metFORMIN (GLUCOPHAGE) 500 MG tablet, liraglutide (VICTOZA) 18 MG/3ML SOPN  Vitamin D deficiency - Plan: Vitamin D, Ergocalciferol, (DRISDOL) 1.25 MG (50000 UT) CAPS capsule  Seasonal allergic rhinitis due to other allergic trigger - Plan: fluticasone (FLONASE) 50 MCG/ACT nasal spray  Class 1 obesity with serious comorbidity and body mass index (BMI) of 32.0 to 32.9 in adult, unspecified obesity type  PLAN:  Diabetes II Erin Roach has  been given extensive diabetes education by myself today including ideal fasting and post-prandial blood glucose readings, individual ideal Hgb A1c goals, and hypoglycemia prevention. We discussed the importance of good blood sugar control to decrease the likelihood of diabetic complications such as nephropathy, neuropathy, limb loss, blindness, coronary artery disease, and death. We discussed the importance of intensive lifestyle modification including diet, exercise and weight loss as the first line treatment for diabetes. Erin Roach agrees to continue her diet,  metformin 539m BID #60 with no refills, and Victoza 1.866mqAM # 3 pens with no refills. She will follow up  in 2 to 3 weeks.  Vitamin D Deficiency Erin Roach informed that low vitamin D levels contributes to fatigue and are associated with obesity, breast, and colon cancer. She agrees to continue to take prescription Vit D '@50' ,000 IU every week #4 with no refills and will follow up for routine testing of vitamin D, at least 2-3 times per year. She was informed of the risk of over-replacement of vitamin D and agrees to not increase her dose unless she discusses this with [place]. Erin Roach to follow up as directed.  Allergic Rhinitis Erin Roach to continue Flonase 5084m 1 spray in each nostril qd #16 grams with no refills. Erin Roach to follow up at the agreed upon time.  Obesity Erin Roach currently in the action stage of change. As such, her goal is to continue with weight loss efforts. She has agreed to keep a food journal with 1500 calories and 80  grams of protein.  Erin Roach has been instructed to work up to a goal of 150 minutes of combined cardio and strengthening exercise per week for weight loss and overall health benefits. We discussed the following Behavioral Modification Strategies today: increasing lean protein intake, travel eating strategies, and keeping a strict food journal.   Erin Roach has agreed to follow up with our clinic in 2  to 3 weeks. She was informed of the importance of frequent follow up visits to maximize her success with intensive lifestyle modifications for her multiple health conditions.  ALLERGIES: No Known Allergies  MEDICATIONS: Current Outpatient Medications on File Prior to Visit  Medication Sig Dispense Refill  . albuterol (PROAIR HFA) 108 (90 Base) MCG/ACT inhaler Inhale 2 puffs into the lungs every 6 (six) hours as needed for wheezing or shortness of breath. 1 Inhaler 2  . Blood Glucose Monitoring Suppl (ONE TOUCH ULTRA 2) w/Device KIT USE TO TEST BLOOD SUGARS UP TO 4 TIMES DAILY 1 each 0  . budesonide-formoterol (SYMBICORT) 160-4.5 MCG/ACT inhaler Inhale 2 puffs into the lungs daily. 1 Inhaler 1  . Cholecalciferol (VITAMIN D) 2000 units tablet Take 1 tablet (2,000 Units total) by mouth daily.    . furosemide (LASIX) 20 MG tablet TAKE ONE TABLET BY MOUTH DAILY 90 tablet 0  . glucose blood (ACCU-CHEK AVIVA) test strip Testing twice daily 100 each 0  . HYDROcodone-acetaminophen (NORCO/VICODIN) 5-325 MG tablet Take 1 tablet by mouth at bedtime. 30 tablet 0  . Insulin Pen Needle (BD PEN NEEDLE NANO 2ND GEN) 32G X 4 MM MISC 1 Package by Does not apply route 2 (two) times daily. 100 each 0  . losartan (COZAAR) 100 MG tablet TAKE ONE TABLET BY MOUTH DAILY 90 tablet 3  . ONETOUCH DELICA LANCETS 76H MISC Use twice daily 100 each 3  . pramipexole (MIRAPEX) 0.25 MG tablet Take 0.5 mg by mouth 2 (two) times daily. 1630  And  Bedtime    . pregabalin (LYRICA) 75 MG capsule TAKE ONE CAPSULE BY MOUTH EVERY AFTERNOON AND TAKE TWO CAPSULES BY MOUTH EVERY NIGHT AT BEDTIME 270 capsule 0  . simvastatin (ZOCOR) 40 MG tablet TAKE ONE TABLET BY MOUTH AT BEDTIME 90 tablet 3  . [DISCONTINUED] VESICARE 10 MG tablet TAKE 1 TABLET EVERY DAY 30 tablet 0   No current facility-administered medications on file prior to visit.     PAST MEDICAL HISTORY: Past Medical History:  Diagnosis Date  . Allergic rhinitis   . Apnea    . Arthritis   . Asthma    very mild  . Back pain   . Bilateral lower extremity edema   . Carotid artery disease ([place])    a. Carotid duplex 03/2016 - duplex was stable, 60-73% RICA, 7-10% LICA  . Complication of anesthesia    post-op delirium  . Endometrial polyp   . Essential hypertension   . GERD (gastroesophageal reflux disease)   . Heart murmur    per pt  . History of adenomatous polyp of colon    04-24-2016  tubular adenoma  . History of squamous cell carcinoma excision    pilonidal area second excision 02-02-2002  . HTN (hypertension)   . Hyperlipidemia   . Leg pain   . OAB (overactive bladder)   . RA (rheumatoid arthritis) ([place])   . RLS (restless legs syndrome)   . Shortness of breath on exertion   . SUI (stress urinary incontinence, female)   . Venous insufficiency    legs  .  Vitamin D deficiency     PAST SURGICAL HISTORY: Past Surgical History:  Procedure Laterality Date  . APPENDECTOMY  07-02-2005   [name]   open  . CARDIAC CATHETERIZATION  01-23-2009  dr Darnell Level brodie   normal coronary arteries and lvf  . CARDIOVASCULAR STRESS TEST  03/31/2016   Low risk nuclear study w/ medium defect of mild severity in basal anterior and mid anterior location w/ no evidence ischemia or infarction/  normal LV function and wall motion,  nuclear stress ef 71%  . CATARACT EXTRACTION W/ INTRAOCULAR LENS IMPLANT Left 09/2016  . COLONOSCOPY  last one 04-24-2016  . HYSTEROSCOPY W/D&C N/A 12/22/2016   Procedure: DILATATION AND CURETTAGE /HYSTEROSCOPY;  Surgeon: Dian Queen, MD;  Location: [facility];  Service: Gynecology;  Laterality: N/A;  . IR US GUIDANCE  09/08/2016  . POSTERIOR LUMBAR FUSION  02/ 2018    [facility] ([place]  . SKIN CANCER EXCISION    . TONSILLECTOMY  child  . TOTAL KNEE ARTHROPLASTY Left 04/09/2015   Procedure: LEFT TOTAL KNEE ARTHROPLASTY;  Surgeon: Paralee Cancel, MD;  Location: [facility];  Service: Orthopedics;  Laterality:  Left;  . TRANSTHORACIC ECHOCARDIOGRAM  01/21/2009   mild LVH, ef 06-23%, grade 1 diastolic dysfunction  . ULNAR NERVE TRANSPOSITION Left 12-20-2008  dr sypher   decompression and partial resection medial triceps fascia  . WIDE LOCAL EXCISION PILONIDAL AREA  02-02-2002  [name]   recurrent squamous cell carcinoma in situ    SOCIAL HISTORY: Social History   Tobacco Use  . Smoking status: Former Smoker    Packs/day: 0.50    Years: 10.00    Pack years: 5.00    Types: Cigarettes    Last attempt to quit: 12/10/1973    Years since quitting: 44.5  . Smokeless tobacco: Never Used  Substance Use Topics  . Alcohol use: Yes    Comment: occasional  . Drug use: No    FAMILY HISTORY: Family History  Problem Relation Age of Onset  . Esophageal cancer Father   . Heart disease Father        Unknown what kind  . Hyperlipidemia Father   . Hypertension Father   . Cancer Father   . Heart failure Father   . Hyperlipidemia Mother   . Hypertension Mother   . CAD Sister        24 years younger than patient - had stent. Was told that her anorexia/bulimia may have played a role  . Stroke Maternal Grandmother 66  . Diabetes Maternal Grandmother   . Heart attack Maternal Grandfather 93  . COPD Maternal Grandfather   . Heart attack Paternal Grandmother        84s  . Diabetes Unknown        MGGM   ROS: Review of Systems  Constitutional: Negative for weight loss.  Gastrointestinal: Negative for nausea and vomiting.  Musculoskeletal:       Negative for muscle weakness.  Endo/Heme/Allergies:       Negative for hypoglycemia.   PHYSICAL EXAM: Blood pressure 138/80, pulse (!) 113, temperature 97.9 F (36.6 C), temperature source Oral, height '5\' 6"'  (1.676 m), weight 199 lb (90.3 kg), SpO2 100 %. Body mass index is 32.12 kg/m. Physical Exam Vitals signs reviewed.  Constitutional:      Appearance: Normal appearance. She is obese.  Cardiovascular:     Rate and Rhythm: Normal rate.   Pulmonary:     Effort: Pulmonary effort is normal.  Musculoskeletal: Normal range of motion.  Skin:    General: Skin is warm and dry.  Neurological:     Mental Status: She is alert and oriented to person, place, and time.  Psychiatric:        Mood and Affect: Mood normal.        Behavior: Behavior normal.    RECENT LABS AND TESTS: BMET    Component Value Date/Time   NA 140 04/20/2018 1130   K 4.5 04/20/2018 1130   CL 101 04/20/2018 1130   CO2 22 04/20/2018 1130   GLUCOSE 97 04/20/2018 1130   GLUCOSE 147 (H) 12/08/2016 1436   BUN 15 04/20/2018 1130   CREATININE 0.74 04/20/2018 1130   CALCIUM 9.0 04/20/2018 1130   GFRNONAA 80 04/20/2018 1130   GFRAA 92 04/20/2018 1130   Lab Results  Component Value Date   HGBA1C 5.7 (H) 04/20/2018   HGBA1C 5.3 01/06/2018   HGBA1C 5.4 09/28/2017   HGBA1C 5.5 06/09/2017   HGBA1C 8.2 (H) 02/23/2017   Lab Results  Component Value Date   INSULIN 27.9 (H) 04/20/2018   INSULIN 24.6 01/06/2018   INSULIN 17.0 09/28/2017   INSULIN 28.3 (H) 06/09/2017   INSULIN 30.7 (H) 02/23/2017   CBC    Component Value Date/Time   WBC 6.2 09/28/2017 0906   WBC 6.8 12/15/2016 1159   RBC 4.07 09/28/2017 0906   RBC 4.23 12/15/2016 1159   HGB 11.8 09/28/2017 0906   HCT 35.4 09/28/2017 0906   PLT 242 12/15/2016 1159   MCV 87 09/28/2017 0906   MCH 29.0 09/28/2017 0906   MCH 30.3 12/15/2016 1159   MCHC 33.3 09/28/2017 0906   MCHC 34.8 12/15/2016 1159   RDW 14.3 09/28/2017 0906   LYMPHSABS 1.5 09/28/2017 0906   MONOABS 0.5 06/04/2016 0808   EOSABS 0.2 09/28/2017 0906   BASOSABS 0.0 09/28/2017 0906   Iron/TIBC/Ferritin/ %Sat    Component Value Date/Time   IRON 109 03/02/2017 1035   TIBC 333 03/19/2015 1448   FERRITIN 218.0 03/02/2017 1035   IRONPCTSAT 15 03/19/2015 1448   Lipid Panel     Component Value Date/Time   CHOL 226 (H) 04/20/2018 1130   TRIG 216 (H) 04/20/2018 1130   TRIG 101 05/19/2006 0832   HDL 54 04/20/2018 1130   CHOLHDL 4  06/04/2016 0808   VLDL 31.4 06/04/2016 0808   LDLCALC 129 (H) 04/20/2018 1130   LDLDIRECT 141.3 09/06/2012 0907   Hepatic Function Panel     Component Value Date/Time   PROT 6.5 04/20/2018 1130   ALBUMIN 4.2 04/20/2018 1130   AST 16 04/20/2018 1130   ALT 20 04/20/2018 1130   ALKPHOS 88 04/20/2018 1130   BILITOT 0.3 04/20/2018 1130   BILIDIR 0.1 12/04/2013 1724      Component Value Date/Time   TSH 4.050 09/28/2017 0906   TSH 4.810 (H) 02/23/2017 1007   TSH 4.22 06/04/2016 0808   Results for WANETA, FITTING (MRN [id number]) as of 05/31/2018 05:24  Ref. Range 04/20/2018 11:30  Vitamin D, 25-Hydroxy Latest Ref Range: 30.0 - 100.0 ng/mL 46.7    OBESITY BEHAVIORAL INTERVENTION VISIT  Today's visit was # 23  Starting weight: 223 lbs Starting date: 02/23/17 Today's weight : Weight: 199 lb (90.3 kg)  Today's date: 05/30/2018 Total lbs lost to date: 24 At least 15 minutes were spent on discussing the following behavioral intervention visit.  ASK: We discussed the diagnosis of obesity with [name] today and [place] agreed to give [name]  permission to discuss obesity behavioral modification therapy today.  ASSESS: Thuy has the diagnosis of obesity and her BMI today is 32.1. Akeira is in the action stage of change.   ADVISE: Demara was educated on the multiple health risks of obesity as well as the benefit of weight loss to improve her health. She was advised of the need for long term treatment and the importance of lifestyle modifications to improve her current health and to decrease her risk of future health problems.  AGREE: Multiple dietary modification options and treatment options were discussed and Alyviah agreed to follow the recommendations documented in the above note.  ARRANGE: Hinata was educated on the importance of frequent visits to treat obesity as outlined per CMS and USPSTF guidelines and agreed to schedule her next follow up appointment today.  I, Marcille Blanco,  am acting as transcriptionist for Starlyn Skeans, MD  I have reviewed the above documentation for accuracy and completeness, and I agree with the above. -Dennard Nip, MD

## 2018-06-06 ENCOUNTER — Encounter: Payer: Self-pay | Admitting: Internal Medicine

## 2018-06-06 MED ORDER — HYDROCODONE-ACETAMINOPHEN 5-325 MG PO TABS: 1.0000 | ORAL_TABLET | Freq: Every day | ORAL | 0 refills | Status: DC

## 2018-06-12 ENCOUNTER — Other Ambulatory Visit (INDEPENDENT_AMBULATORY_CARE_PROVIDER_SITE_OTHER): Payer: Self-pay | Admitting: Family Medicine

## 2018-06-12 DIAGNOSIS — R809 Proteinuria, unspecified: Principal | ICD-10-CM

## 2018-06-12 DIAGNOSIS — E1129 Type 2 diabetes mellitus with other diabetic kidney complication: Secondary | ICD-10-CM

## 2018-06-14 ENCOUNTER — Other Ambulatory Visit (INDEPENDENT_AMBULATORY_CARE_PROVIDER_SITE_OTHER): Payer: Self-pay | Admitting: Family Medicine

## 2018-06-14 DIAGNOSIS — R809 Proteinuria, unspecified: Principal | ICD-10-CM

## 2018-06-14 DIAGNOSIS — E1129 Type 2 diabetes mellitus with other diabetic kidney complication: Secondary | ICD-10-CM

## 2018-06-15 ENCOUNTER — Encounter: Payer: Self-pay | Admitting: Internal Medicine

## 2018-06-15 ENCOUNTER — Ambulatory Visit: Payer: Medicare Other | Admitting: Internal Medicine

## 2018-06-15 VITALS — BP 136/74 | HR 85 | Temp 99.1°F | Resp 16 | Ht 66.0 in | Wt 204.0 lb

## 2018-06-15 DIAGNOSIS — I10 Essential (primary) hypertension: Secondary | ICD-10-CM | POA: Diagnosis not present

## 2018-06-15 DIAGNOSIS — G2581 Restless legs syndrome: Secondary | ICD-10-CM

## 2018-06-15 DIAGNOSIS — E119 Type 2 diabetes mellitus without complications: Secondary | ICD-10-CM

## 2018-06-15 DIAGNOSIS — E7849 Other hyperlipidemia: Secondary | ICD-10-CM

## 2018-06-15 MED ORDER — BALOXAVIR MARBOXIL(40 MG DOSE) 2 X 20 MG PO TBPK: 40.0000 mg | ORAL_TABLET | Freq: Once | ORAL | 0 refills | Status: AC

## 2018-06-15 NOTE — Assessment & Plan Note (Signed)
Following with a specialist in [place] Taking horizontal, Lyrica, Mirapex and hydrocodone Current symptoms are controlled Continue above

## 2018-06-15 NOTE — Patient Instructions (Signed)
   Medications reviewed and updated.  Changes include :   None. Marlis Edelson for your trip  Your prescription(s) have been submitted to your pharmacy. Please take as directed and contact our office if you believe you are having problem(s) with the medication(s).    Please followup in 6 months

## 2018-06-15 NOTE — Progress Notes (Signed)
Subjective:    Patient ID: Erin Roach, female    DOB: November 28, 1940, 78 y.o.   MRN: [id number]  HPI The patient is here for follow up.  Hypertension: She is taking her medication daily. She is compliant with a low sodium diet.  She denies chest pain, palpitations, edema, shortness of breath and regular headaches. She is exercising regularly.      Diabetes: She is taking her medication daily as prescribed. She is compliant with a diabetic diet. She is exercising regularly. She monitors her sugars and they have been running around 114. She checks her feet daily and denies foot lesions. She is up-to-date with an ophthalmology examination.   Hyperlipidemia: She is taking her medication daily. She is compliant with a low fat/cholesterol diet. She is exercising regularly. She denies myalgias.   RLS:  She takes the hydrocodone nightly.  She also takes mirapex and Lyrica.  She uses her cpap nightly.  She is following with an RLS specialist in [place].   She is taking horizant that is prescribed by the RLS specialist.  Her RLS is controlled for now.    Travel:  She is going to [place] on the 12 th of this month.  She does want to take Xofluza with her for her trip.  She also was concerned she may need meningitis vaccine.  Medications and allergies reviewed with patient and updated if appropriate.  Patient Active Problem List   Diagnosis Date Noted  . OSA on CPAP 06/15/2017  . Diabetes ([place]) 03/02/2017  . Herpes zoster without complication 00/34/9179  . Shortness of breath on exertion 02/23/2017  . B12 nutritional deficiency 02/23/2017  . Vitamin D deficiency 12/08/2016  . Tubular adenoma of colon 04/29/2016  . Essential hypertension 09/12/2015  . Allergic rhinitis 07/17/2015  . Insomnia 07/17/2015  . Obese 04/10/2015  . S/P left TKA 04/09/2015  . Osteopenia 12/04/2012  . 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 RESTLESS LEG SYNDROME 02/11/2009  . LEG EDEMA, BILATERAL 02/11/2009  .  Hyperlipidemia 10/30/2008  . Urinary incontinence 10/30/2008  . Skin cancer 10/30/2008  . Asthma 11/21/2007  . Venous (peripheral) insufficiency 11/22/2006    Current Outpatient Medications on File Prior to Visit  Medication Sig Dispense Refill  . albuterol (PROAIR HFA) 108 (90 Base) MCG/ACT inhaler Inhale 2 puffs into the lungs every 6 (six) hours as needed for wheezing or shortness of breath. 1 Inhaler 2  . Blood Glucose Monitoring Suppl (ONE TOUCH ULTRA 2) w/Device KIT USE TO TEST BLOOD SUGARS UP TO 4 TIMES DAILY 1 each 0  . budesonide-formoterol (SYMBICORT) 160-4.5 MCG/ACT inhaler Inhale 2 puffs into the lungs daily. 1 Inhaler 1  . Cholecalciferol (VITAMIN D) 2000 units tablet Take 1 tablet (2,000 Units total) by mouth daily.    . fluticasone (FLONASE) 50 MCG/ACT nasal spray Place 1 spray into both nostrils daily. 16 g 0  . furosemide (LASIX) 20 MG tablet TAKE ONE TABLET BY MOUTH DAILY 90 tablet 0  . glucose blood (ACCU-CHEK AVIVA) test strip Testing twice daily 100 each 0  . HYDROcodone-acetaminophen (NORCO/VICODIN) 5-325 MG tablet Take 1 tablet by mouth at bedtime. 30 tablet 0  . Insulin Pen Needle (BD PEN NEEDLE NANO 2ND GEN) 32G X 4 MM MISC 1 Package by Does not apply route 2 (two) times daily. 100 each 0  . liraglutide (VICTOZA) 18 MG/3ML SOPN INJECT 1.8 MG TOTAL INTO THE SKIN EVERY MORNING. 3 pen 0  . losartan (COZAAR) 100 MG tablet TAKE ONE TABLET  BY MOUTH DAILY 90 tablet 3  . metFORMIN (GLUCOPHAGE) 500 MG tablet TAKE ONE TABLET BY MOUTH TWICE A DAY WITH A MEAL 60 tablet 0  . pramipexole (MIRAPEX) 0.25 MG tablet Take 0.5 mg by mouth 2 (two) times daily. 1630  And  Bedtime    . pregabalin (LYRICA) 75 MG capsule TAKE ONE CAPSULE BY MOUTH EVERY AFTERNOON AND TAKE TWO CAPSULES BY MOUTH EVERY NIGHT AT BEDTIME 270 capsule 0  . simvastatin (ZOCOR) 40 MG tablet TAKE ONE TABLET BY MOUTH AT BEDTIME 90 tablet 3  . Vitamin D, Ergocalciferol, (DRISDOL) 1.25 MG (50000 UT) CAPS capsule Take 1  capsule (50,000 Units total) by mouth every 7 (seven) days. 4 capsule 0  . HORIZANT 600 MG TBCR     . [DISCONTINUED] VESICARE 10 MG tablet TAKE 1 TABLET EVERY DAY 30 tablet 0   No current facility-administered medications on file prior to visit.     Past Medical History:  Diagnosis Date  . Allergic rhinitis   . Apnea   . Arthritis   . Asthma    very mild  . Back pain   . Bilateral lower extremity edema   . Carotid artery disease ([place])    a. Carotid duplex 03/2016 - duplex was stable, 14-78% RICA, 2-95% LICA  . Complication of anesthesia    post-op delirium  . Endometrial polyp   . Essential hypertension   . GERD (gastroesophageal reflux disease)   . Heart murmur    per pt  . History of adenomatous polyp of colon    04-24-2016  tubular adenoma  . History of squamous cell carcinoma excision    pilonidal area second excision 02-02-2002  . HTN (hypertension)   . Hyperlipidemia   . Leg pain   . OAB (overactive bladder)   . RA (rheumatoid arthritis) ([place])   . RLS (restless legs syndrome)   . Shortness of breath on exertion   . SUI (stress urinary incontinence, female)   . Venous insufficiency    legs  . Vitamin D deficiency     Past Surgical History:  Procedure Laterality Date  . APPENDECTOMY  07-02-2005   [name]   open  . CARDIAC CATHETERIZATION  01-23-2009  dr Darnell Level brodie   normal coronary arteries and lvf  . CARDIOVASCULAR STRESS TEST  03/31/2016   Low risk nuclear study w/ medium defect of mild severity in basal anterior and mid anterior location w/ no evidence ischemia or infarction/  normal LV function and wall motion,  nuclear stress ef 71%  . CATARACT EXTRACTION W/ INTRAOCULAR LENS IMPLANT Left 09/2016  . COLONOSCOPY  last one 04-24-2016  . HYSTEROSCOPY W/D&C N/A 12/22/2016   Procedure: DILATATION AND CURETTAGE /HYSTEROSCOPY;  Surgeon: Dian Queen, MD;  Location: [facility];  Service: Gynecology;  Laterality: N/A;  . IR US GUIDANCE   09/08/2016  . POSTERIOR LUMBAR FUSION  02/ 2018    [facility] ([place])  . SKIN CANCER EXCISION    . TONSILLECTOMY  child  . 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 TRANSTHORACIC ECHOCARDIOGRAM  01/21/2009   mild LVH, ef 62-13%, grade 1 diastolic dysfunction  . ULNAR NERVE TRANSPOSITION Left 12-20-2008  dr sypher   decompression and partial resection medial triceps fascia  . WIDE LOCAL EXCISION PILONIDAL AREA  02-02-2002  [name]   recurrent squamous cell carcinoma in  situ    Social History   Socioeconomic History  . Marital status: Married    Spouse name: Clare Gandy  . Number of children: 2  . Years of education: Not on file  . Highest education level: Not on file  Occupational History  . Occupation: retired Tour manager  . Financial resource strain: Not hard at all  . Food insecurity:    Worry: Never true    Inability: Never true  . Transportation needs:    Medical: No    Non-medical: No  Tobacco Use  . Smoking status: Former Smoker    Packs/day: 0.50    Years: 10.00    Pack years: 5.00    Types: Cigarettes    Last attempt to quit: 12/10/1973    Years since quitting: 44.5  . Smokeless tobacco: Never Used  Substance and Sexual Activity  . Alcohol use: Yes    Comment: occasional  . Drug use: No  . Sexual activity: Not on file  Lifestyle  . Physical activity:    Days per week: 3 days    Minutes per session: 40 min  . Stress: Only a little  Relationships  . Social connections:    Talks on phone: More than three times a week    Gets together: More than three times a week    Attends religious service: Not on file    Active member of club or organization: Not on file    Attends meetings of clubs or organizations: Not on file    Relationship status: Married  Other Topics Concern  . Not on file  Social History  Narrative   2 children ages 106,35    Family History  Problem Relation Age of Onset  . Esophageal cancer Father   . Heart disease Father        Unknown what kind  . Hyperlipidemia Father   . Hypertension Father   . Cancer Father   . Heart failure Father   . Hyperlipidemia Mother   . Hypertension Mother   . CAD Sister        70 years younger than patient - had stent. Was told that her anorexia/bulimia may have played a role  . Stroke Maternal Grandmother 66  . Diabetes Maternal Grandmother   . Heart attack Maternal Grandfather 93  . COPD Maternal Grandfather   . Heart attack Paternal Grandmother        28s  . Diabetes Unknown        MGGM    Review of Systems  Constitutional: Negative for chills and fever.  Respiratory: Negative for cough, shortness of breath and wheezing.   Cardiovascular: Negative for chest pain, palpitations and leg swelling.  Neurological: Negative for light-headedness and headaches.       Objective:   Vitals:   06/15/18 1347  BP: 136/74  Pulse: 85  Resp: 16  Temp: 99.1 F (37.3 C)  SpO2: 99%   BP Readings from Last 3 Encounters:  06/15/18 136/74  05/30/18 138/80  04/20/18 118/78   Wt Readings from Last 3 Encounters:  06/15/18 204 lb (92.5 kg)  05/30/18 199 lb (90.3 kg)  04/20/18 198 lb (89.8 kg)   Body mass index is 32.93 kg/m.   Physical Exam    Constitutional: Appears well-developed and well-nourished. No distress.  HENT:  Head: Normocephalic and atraumatic.  Neck: Neck supple. No tracheal deviation present. No thyromegaly present.  No cervical lymphadenopathy Cardiovascular: Normal rate, regular rhythm and normal heart sounds.  No murmur heard. No carotid bruit .  No edema Pulmonary/Chest: Effort normal and breath sounds normal. No respiratory distress. No has no wheezes. No rales.  Skin: Skin is warm and dry. Not diaphoretic.  Psychiatric: Normal mood and affect. Behavior is normal.      Assessment & Plan:    I did  prescribe Xofluza.  She may or may not get this depending on how expensive it is.  Discussed when to take it and that it is only effective against influenza.  Discussed that it may have certain side effects.  She does not need the meningitis vaccine for travel to [place]   See Problem List for Assessment and Plan of chronic medical problems.

## 2018-06-15 NOTE — Assessment & Plan Note (Signed)
Lab Results  Component Value Date   HGBA1C 5.7 (H) 04/20/2018    Sugars well controlled with diet and lifestyle Continue regular exercise and weight loss efforts along with diabetic diet Follow-up in 6 months

## 2018-06-15 NOTE — Assessment & Plan Note (Signed)
Continue statin. 

## 2018-06-15 NOTE — Assessment & Plan Note (Signed)
BP Readings from Last 3 Encounters:  06/15/18 136/74  05/30/18 138/80  04/20/18 118/78    BP well controlled Current regimen effective and well tolerated Continue current medications at current doses Recent CMP reviewed

## 2018-06-16 ENCOUNTER — Encounter: Payer: Self-pay | Admitting: Internal Medicine

## 2018-06-20 ENCOUNTER — Other Ambulatory Visit (INDEPENDENT_AMBULATORY_CARE_PROVIDER_SITE_OTHER): Payer: Self-pay | Admitting: Family Medicine

## 2018-06-20 DIAGNOSIS — E559 Vitamin D deficiency, unspecified: Secondary | ICD-10-CM

## 2018-06-21 ENCOUNTER — Ambulatory Visit (INDEPENDENT_AMBULATORY_CARE_PROVIDER_SITE_OTHER): Payer: Medicare Other | Admitting: Family Medicine

## 2018-06-21 ENCOUNTER — Encounter (INDEPENDENT_AMBULATORY_CARE_PROVIDER_SITE_OTHER): Payer: Self-pay | Admitting: Family Medicine

## 2018-06-21 VITALS — BP 122/84 | HR 101 | Temp 98.0°F | Ht 66.0 in | Wt 199.0 lb

## 2018-06-21 DIAGNOSIS — E669 Obesity, unspecified: Secondary | ICD-10-CM | POA: Diagnosis not present

## 2018-06-21 DIAGNOSIS — E559 Vitamin D deficiency, unspecified: Secondary | ICD-10-CM

## 2018-06-21 DIAGNOSIS — Z6832 Body mass index (BMI) 32.0-32.9, adult: Secondary | ICD-10-CM

## 2018-06-21 DIAGNOSIS — R809 Proteinuria, unspecified: Secondary | ICD-10-CM

## 2018-06-21 DIAGNOSIS — J3089 Other allergic rhinitis: Secondary | ICD-10-CM | POA: Diagnosis not present

## 2018-06-21 DIAGNOSIS — E1129 Type 2 diabetes mellitus with other diabetic kidney complication: Secondary | ICD-10-CM | POA: Diagnosis not present

## 2018-06-21 MED ORDER — METFORMIN HCL 500 MG PO TABS: ORAL_TABLET | ORAL | 0 refills | Status: DC

## 2018-06-21 MED ORDER — LIRAGLUTIDE 18 MG/3ML ~~LOC~~ SOPN: PEN_INJECTOR | SUBCUTANEOUS | 0 refills | Status: DC

## 2018-06-21 MED ORDER — FLUTICASONE PROPIONATE 50 MCG/ACT NA SUSP: 1.0000 | Freq: Every day | NASAL | 0 refills | Status: DC

## 2018-06-21 MED ORDER — VITAMIN D (ERGOCALCIFEROL) 1.25 MG (50000 UNIT) PO CAPS: 50000.0000 [IU] | ORAL_CAPSULE | ORAL | 0 refills | Status: DC

## 2018-06-22 ENCOUNTER — Other Ambulatory Visit (INDEPENDENT_AMBULATORY_CARE_PROVIDER_SITE_OTHER): Payer: Self-pay | Admitting: Family Medicine

## 2018-06-22 DIAGNOSIS — E1129 Type 2 diabetes mellitus with other diabetic kidney complication: Secondary | ICD-10-CM

## 2018-06-22 DIAGNOSIS — R809 Proteinuria, unspecified: Principal | ICD-10-CM

## 2018-06-22 NOTE — Progress Notes (Signed)
Office: [phone]  /  Fax: [phone]   HPI:   Chief Complaint: OBESITY Erin Roach is here to discuss her progress with her obesity treatment plan. She is on the keep a food journal with 1500 calories and 80 grams of protein daily and is following her eating plan approximately 85 % of the time. She states she is walking and bike riding for 17 minutes 5 times per week. Erin Roach has done well with maintaining weight. She is going on a 2 week vacation and has questions on how to manage while traveling.  Her weight is 199 lb (90.3 kg) today and has not lost weight since her last visit. She has lost 24 lbs since starting treatment with [name].  Allergic Rhinitis Zetha has a diagnosis of allergic rhinitis. She is stable on flonase and denies epistaxis. She requests a refill.  Diabetes II Erin Roach has a diagnosis of diabetes type II. Saskia is stable on medications. She did not bring in BGs log today. Last A1c was 5.7. She has been working on intensive lifestyle modifications including diet, exercise, and weight loss to help control her blood glucose levels. She denies nausea, vomiting, or hypoglycemia.  Vitamin D Deficiency Erin Roach has a diagnosis of vitamin D deficiency. She is stable on prescription Vit D and denies nausea, vomiting or muscle weakness.  ASSESSMENT AND PLAN:  Seasonal allergic rhinitis due to other allergic trigger - Plan: fluticasone (FLONASE) 50 MCG/ACT nasal spray  Type 2 diabetes mellitus with microalbuminuria, without long-term current use of insulin (HCC) - Plan: liraglutide (VICTOZA) 18 MG/3ML SOPN, metFORMIN (GLUCOPHAGE) 500 MG tablet  Vitamin D deficiency - Plan: Vitamin D, Ergocalciferol, (DRISDOL) 1.25 MG (50000 UT) CAPS capsule  Class 1 obesity with serious comorbidity and body mass index (BMI) of 32.0 to 32.9 in adult, unspecified obesity type  PLAN:  Allergic Rhinitis Erin Roach agrees to continue Flonase 50 mcg 1 spray into both nostrils daily #1 spray and we will refill  for 1 month. Erin Roach agrees to follow up with our clinic in 3 to 4 weeks.  Diabetes II Erin Roach has been given extensive diabetes education by myself today including ideal fasting and post-prandial blood glucose readings, individual ideal Hgb A1c goals and hypoglycemia prevention. We discussed the importance of good blood sugar control to decrease the likelihood of diabetic complications such as nephropathy, neuropathy, limb loss, blindness, coronary artery disease, and death. We discussed the importance of intensive lifestyle modification including diet, exercise and weight loss as the first line treatment for diabetes. Erin Roach agrees to continue Victoza 1.8 mg SubQ q AM #3 pens and we will refill for 1 month; she agrees to continue taking metformin 500 mg BID #60 and we will refill for 1 month. Demetri agrees to follow up with our clinic in 3 to 4 weeks.  Vitamin D Deficiency Erin Roach was informed that low vitamin D levels contributes to fatigue and are associated with obesity, breast, and colon cancer. Erin Roach agrees to continue taking prescription Vit D _0 ,000 IU every week #4 and we will refill for 1 month. She will follow up for routine testing of vitamin D, at least 2-3 times per year. She was informed of the risk of over-replacement of vitamin D and agrees to not increase her dose unless she discusses this with [name] first. Erin Roach agrees to follow up with our clinic in 3 to 4 weeks.  Obesity Erin Roach is currently in the action stage of change. As such, her goal is to continue with weight loss efforts She  has agreed to portion control better and make smarter food choices, such as increase vegetables and decrease simple carbohydrates  Erika has been instructed to work up to a goal of 150 minutes of combined cardio and strengthening exercise per week for weight loss and overall health benefits. We discussed the following Behavioral Modification Strategies today: increasing lean protein intake, decreasing simple  carbohydrates, increasing vegetables, ways to avoid boredom eating, and better snacking choices   Erin Roach has agreed to follow up with our clinic in 3 to 4 weeks. She was informed of the importance of frequent follow up visits to maximize her success with intensive lifestyle modifications for her multiple health conditions.  ALLERGIES: No Known Allergies  MEDICATIONS: Current Outpatient Medications on File Prior to Visit  Medication Sig Dispense Refill  . albuterol (PROAIR HFA) 108 (90 Base) MCG/ACT inhaler Inhale 2 puffs into the lungs every 6 (six) hours as needed for wheezing or shortness of breath. 1 Inhaler 2  . Blood Glucose Monitoring Suppl (ONE TOUCH ULTRA 2) w/Device KIT USE TO TEST BLOOD SUGARS UP TO 4 TIMES DAILY 1 each 0  . budesonide-formoterol (SYMBICORT) 160-4.5 MCG/ACT inhaler Inhale 2 puffs into the lungs daily. 1 Inhaler 1  . Cholecalciferol (VITAMIN D) 2000 units tablet Take 1 tablet (2,000 Units total) by mouth daily.    . furosemide (LASIX) 20 MG tablet TAKE ONE TABLET BY MOUTH DAILY 90 tablet 0  . glucose blood (ACCU-CHEK AVIVA) test strip Testing twice daily 100 each 0  . HORIZANT 600 MG TBCR     . HYDROcodone-acetaminophen (NORCO/VICODIN) 5-325 MG tablet Take 1 tablet by mouth at bedtime. 30 tablet 0  . Insulin Pen Needle (BD PEN NEEDLE NANO 2ND GEN) 32G X 4 MM MISC 1 Package by Does not apply route 2 (two) times daily. 100 each 0  . losartan (COZAAR) 100 MG tablet TAKE ONE TABLET BY MOUTH DAILY 90 tablet 3  . pramipexole (MIRAPEX) 0.25 MG tablet Take 0.5 mg by mouth 2 (two) times daily. 1630  And  Bedtime    . pregabalin (LYRICA) 75 MG capsule TAKE ONE CAPSULE BY MOUTH EVERY AFTERNOON AND TAKE TWO CAPSULES BY MOUTH EVERY NIGHT AT BEDTIME 270 capsule 0  . simvastatin (ZOCOR) 40 MG tablet TAKE ONE TABLET BY MOUTH AT BEDTIME 90 tablet 3  . [DISCONTINUED] VESICARE 10 MG tablet TAKE 1 TABLET EVERY DAY 30 tablet 0   No current facility-administered medications on file prior  to visit.     PAST MEDICAL HISTORY: Past Medical History:  Diagnosis Date  . Allergic rhinitis   . Apnea   . Arthritis   . Asthma    very mild  . Back pain   . Bilateral lower extremity edema   . Carotid artery disease ([place])    a. Carotid duplex 03/2016 - duplex was stable, 76-16% RICA, 0-73% LICA  . Complication of anesthesia    post-op delirium  . Endometrial polyp   . Essential hypertension   . GERD (gastroesophageal reflux disease)   . Heart murmur    per pt  . History of adenomatous polyp of colon    04-24-2016  tubular adenoma  . History of squamous cell carcinoma excision    pilonidal area second excision 02-02-2002  . HTN (hypertension)   . Hyperlipidemia   . Leg pain   . OAB (overactive bladder)   . RA (rheumatoid arthritis) ([place])   . RLS (restless legs syndrome)   . Shortness of breath on exertion   .  SUI (stress urinary incontinence, female)   . Venous insufficiency    legs  . Vitamin D deficiency     PAST SURGICAL HISTORY: Past Surgical History:  Procedure Laterality Date  . APPENDECTOMY  07-02-2005   [name]   open  . CARDIAC CATHETERIZATION  01-23-2009  dr Darnell Level brodie   normal coronary arteries and lvf  . CARDIOVASCULAR STRESS TEST  03/31/2016   Low risk nuclear study w/ medium defect of mild severity in basal anterior and mid anterior location w/ no evidence ischemia or infarction/  normal LV function and wall motion,  nuclear stress ef 71%  . CATARACT EXTRACTION W/ INTRAOCULAR LENS IMPLANT Left 09/2016  . COLONOSCOPY  last one 04-24-2016  . HYSTEROSCOPY W/D&C N/A 12/22/2016   Procedure: DILATATION AND CURETTAGE /HYSTEROSCOPY;  Surgeon: Dian Queen, MD;  Location: [facility];  Service: Gynecology;  Laterality: N/A;  . IR US GUIDANCE  09/08/2016  . POSTERIOR LUMBAR FUSION  02/ 2018    [facility] ([place], [place])  . SKIN CANCER EXCISION    . TONSILLECTOMY  child  . TOTAL KNEE ARTHROPLASTY Left 04/09/2015   Procedure:  LEFT TOTAL KNEE ARTHROPLASTY;  Surgeon: Paralee Cancel, MD;  Location: [facility];  Service: Orthopedics;  Laterality: Left;  . TRANSTHORACIC ECHOCARDIOGRAM  01/21/2009   mild LVH, ef 76-73%, grade 1 diastolic dysfunction  . ULNAR NERVE TRANSPOSITION Left 12-20-2008  dr sypher   decompression and partial resection medial triceps fascia  . WIDE LOCAL EXCISION PILONIDAL AREA  02-02-2002  [name]   recurrent squamous cell carcinoma in situ    SOCIAL HISTORY: Social History   Tobacco Use  . Smoking status: Former Smoker    Packs/day: 0.50    Years: 10.00    Pack years: 5.00    Types: Cigarettes    Last attempt to quit: 12/10/1973    Years since quitting: 44.5  . Smokeless tobacco: Never Used  Substance Use Topics  . Alcohol use: Yes    Comment: occasional  . Drug use: No    FAMILY HISTORY: Family History  Problem Relation Age of Onset  . Esophageal cancer Father   . Heart disease Father        Unknown what kind  . Hyperlipidemia Father   . Hypertension Father   . Cancer Father   . Heart failure Father   . Hyperlipidemia Mother   . Hypertension Mother   . CAD Sister        49 years younger than patient - had stent. Was told that her anorexia/bulimia may have played a role  . Stroke Maternal Grandmother 66  . Diabetes Maternal Grandmother   . Heart attack Maternal Grandfather 93  . COPD Maternal Grandfather   . Heart attack Paternal Grandmother        65s  . Diabetes Unknown        MGGM    ROS: Review of Systems  Constitutional: Negative for weight loss.  Gastrointestinal: Negative for nausea and vomiting.  Musculoskeletal:       Negative muscle weakness  Endo/Heme/Allergies:       Negative hypoglycemia    PHYSICAL EXAM: Blood pressure 122/84, pulse (!) 101, temperature 98 F (36.7 C), temperature source Oral, height _0  (1.676 m), weight 199 lb (90.3 kg), SpO2 96 %. Body mass index is 32.12 kg/m. Physical Exam Vitals signs reviewed.  Constitutional:       Appearance: Normal appearance. She is obese.  Cardiovascular:     Rate  and Rhythm: Normal rate.     Pulses: Normal pulses.  Pulmonary:     Effort: Pulmonary effort is normal.     Breath sounds: Normal breath sounds.  Musculoskeletal: Normal range of motion.  Skin:    General: Skin is warm and dry.  Neurological:     Mental Status: She is alert and oriented to person, place, and time.  Psychiatric:        Mood and Affect: Mood normal.        Behavior: Behavior normal.     RECENT LABS AND TESTS: BMET    Component Value Date/Time   NA 140 04/20/2018 1130   K 4.5 04/20/2018 1130   CL 101 04/20/2018 1130   CO2 22 04/20/2018 1130   GLUCOSE 97 04/20/2018 1130   GLUCOSE 147 (H) 12/08/2016 1436   BUN 15 04/20/2018 1130   CREATININE 0.74 04/20/2018 1130   CALCIUM 9.0 04/20/2018 1130   GFRNONAA 80 04/20/2018 1130   GFRAA 92 04/20/2018 1130   Lab Results  Component Value Date   HGBA1C 5.7 (H) 04/20/2018   HGBA1C 5.3 01/06/2018   HGBA1C 5.4 09/28/2017   HGBA1C 5.5 06/09/2017   HGBA1C 8.2 (H) 02/23/2017   Lab Results  Component Value Date   INSULIN 27.9 (H) 04/20/2018   INSULIN 24.6 01/06/2018   INSULIN 17.0 09/28/2017   INSULIN 28.3 (H) 06/09/2017   INSULIN 30.7 (H) 02/23/2017   CBC    Component Value Date/Time   WBC 6.2 09/28/2017 0906   WBC 6.8 12/15/2016 1159   RBC 4.07 09/28/2017 0906   RBC 4.23 12/15/2016 1159   HGB 11.8 09/28/2017 0906   HCT 35.4 09/28/2017 0906   PLT 242 12/15/2016 1159   MCV 87 09/28/2017 0906   MCH 29.0 09/28/2017 0906   MCH 30.3 12/15/2016 1159   MCHC 33.3 09/28/2017 0906   MCHC 34.8 12/15/2016 1159   RDW 14.3 09/28/2017 0906   LYMPHSABS 1.5 09/28/2017 0906   MONOABS 0.5 06/04/2016 0808   EOSABS 0.2 09/28/2017 0906   BASOSABS 0.0 09/28/2017 0906   Iron/TIBC/Ferritin/ %Sat    Component Value Date/Time   IRON 109 03/02/2017 1035   TIBC 333 03/19/2015 1448   FERRITIN 218.0 03/02/2017 1035   IRONPCTSAT 15 03/19/2015 1448   Lipid  Panel     Component Value Date/Time   CHOL 226 (H) 04/20/2018 1130   TRIG 216 (H) 04/20/2018 1130   TRIG 101 05/19/2006 0832   HDL 54 04/20/2018 1130   CHOLHDL 4 06/04/2016 0808   VLDL 31.4 06/04/2016 0808   LDLCALC 129 (H) 04/20/2018 1130   LDLDIRECT 141.3 09/06/2012 0907   Hepatic Function Panel     Component Value Date/Time   PROT 6.5 04/20/2018 1130   ALBUMIN 4.2 04/20/2018 1130   AST 16 04/20/2018 1130   ALT 20 04/20/2018 1130   ALKPHOS 88 04/20/2018 1130   BILITOT 0.3 04/20/2018 1130   BILIDIR 0.1 12/04/2013 1724      Component Value Date/Time   TSH 4.050 09/28/2017 0906   TSH 4.810 (H) 02/23/2017 1007   TSH 4.22 06/04/2016 0808      OBESITY BEHAVIORAL INTERVENTION VISIT  Today's visit was # 24   Starting weight: 223 lbs Starting date: 02/23/17 Today's weight : 199 lbs Today's date: 06/22/2018 Total lbs lost to date: 24 At least 15 minutes were spent on discussing the following behavioral intervention visit.   ASK: We discussed the diagnosis of obesity with [name] today and [name] agreed  to give [name] permission to discuss obesity behavioral modification therapy today.  ASSESS: Shelia has the diagnosis of obesity and her BMI today is 32.13 Cayleigh is in the action stage of change   ADVISE: Wilburta was educated on the multiple health risks of obesity as well as the benefit of weight loss to improve her health. She was advised of the need for long term treatment and the importance of lifestyle modifications to improve her current health and to decrease her risk of future health problems.  AGREE: Multiple dietary modification options and treatment options were discussed and  Braylyn agreed to follow the recommendations documented in the above note.  ARRANGE: Noga was educated on the importance of frequent visits to treat obesity as outlined per CMS and USPSTF guidelines and agreed to schedule her next follow up appointment today.  I, Trixie Dredge, am  acting as transcriptionist for Dennard Nip, MD  I have reviewed the above documentation for accuracy and completeness, and I agree with the above. -Dennard Nip, MD

## 2018-07-01 ENCOUNTER — Other Ambulatory Visit (INDEPENDENT_AMBULATORY_CARE_PROVIDER_SITE_OTHER): Payer: Self-pay | Admitting: Family Medicine

## 2018-07-01 DIAGNOSIS — E1129 Type 2 diabetes mellitus with other diabetic kidney complication: Secondary | ICD-10-CM

## 2018-07-01 DIAGNOSIS — R809 Proteinuria, unspecified: Principal | ICD-10-CM

## 2018-07-10 ENCOUNTER — Encounter: Payer: Self-pay | Admitting: Internal Medicine

## 2018-07-12 MED ORDER — HYDROCODONE-ACETAMINOPHEN 5-325 MG PO TABS: 1.0000 | ORAL_TABLET | Freq: Every day | ORAL | 0 refills | Status: DC

## 2018-07-12 NOTE — Addendum Note (Signed)
Addended by: Binnie Rail on: 07/12/2018 09:09 PM   Modules accepted: Orders

## 2018-07-14 ENCOUNTER — Encounter: Payer: Self-pay | Admitting: Internal Medicine

## 2018-07-15 ENCOUNTER — Other Ambulatory Visit (INDEPENDENT_AMBULATORY_CARE_PROVIDER_SITE_OTHER): Payer: Self-pay | Admitting: Family Medicine

## 2018-07-15 DIAGNOSIS — E559 Vitamin D deficiency, unspecified: Secondary | ICD-10-CM

## 2018-07-17 ENCOUNTER — Other Ambulatory Visit (INDEPENDENT_AMBULATORY_CARE_PROVIDER_SITE_OTHER): Payer: Self-pay | Admitting: Family Medicine

## 2018-07-17 DIAGNOSIS — R809 Proteinuria, unspecified: Principal | ICD-10-CM

## 2018-07-17 DIAGNOSIS — E1129 Type 2 diabetes mellitus with other diabetic kidney complication: Secondary | ICD-10-CM

## 2018-07-19 ENCOUNTER — Other Ambulatory Visit: Payer: Self-pay

## 2018-07-19 ENCOUNTER — Encounter (INDEPENDENT_AMBULATORY_CARE_PROVIDER_SITE_OTHER): Payer: Self-pay | Admitting: Family Medicine

## 2018-07-19 ENCOUNTER — Ambulatory Visit (INDEPENDENT_AMBULATORY_CARE_PROVIDER_SITE_OTHER): Payer: Medicare Other | Admitting: Family Medicine

## 2018-07-19 VITALS — BP 127/58 | HR 103 | Ht 66.0 in | Wt 196.0 lb

## 2018-07-19 DIAGNOSIS — E559 Vitamin D deficiency, unspecified: Secondary | ICD-10-CM | POA: Diagnosis not present

## 2018-07-19 DIAGNOSIS — Z6831 Body mass index (BMI) 31.0-31.9, adult: Secondary | ICD-10-CM

## 2018-07-19 DIAGNOSIS — E669 Obesity, unspecified: Secondary | ICD-10-CM | POA: Diagnosis not present

## 2018-07-19 MED ORDER — VITAMIN D (ERGOCALCIFEROL) 1.25 MG (50000 UNIT) PO CAPS: 50000.0000 [IU] | ORAL_CAPSULE | ORAL | 0 refills | Status: DC

## 2018-07-19 NOTE — Progress Notes (Signed)
Office: [phone]  /  Fax: [phone]   HPI:   Chief Complaint: OBESITY Erin Roach is here to discuss her progress with her obesity treatment plan. She is on the portion control and smarter choices and is following her eating plan approximately 50 % of the time. She states she is walking more than normal. Erin Roach did well with diet even while traveling in [place] She indulged in some celebration eating, but did very well with portion control and smarter choices. She is ready to get back on a structured diet prescription.  Her weight is 196 lb (88.9 kg) today and has had a weight loss of 3 pounds over a period of 4 weeks since her last visit. She has lost 27 lbs since starting treatment with [name].  Vitamin D Deficiency Erin Roach has a diagnosis of vitamin D deficiency. She is currently stable on vit D. Erin Roach denies nausea, vomiting, or muscle weakness.  ASSESSMENT AND PLAN:  Vitamin D deficiency - Plan: Vitamin D, Ergocalciferol, (DRISDOL) 1.25 MG (50000 UT) CAPS capsule  Class 1 obesity with serious comorbidity and body mass index (BMI) of 31.0 to 31.9 in adult, unspecified obesity type  PLAN:  Vitamin D Deficiency Erin Roach was informed that low vitamin D levels contributes to fatigue and are associated with obesity, breast, and colon cancer. Erin Roach agrees to continue to take prescription Vit D '@50' ,000 IU every week #4 with no refills and will follow up for routine testing of vitamin D, at least 2-3 times per year. She was informed of the risk of over-replacement of vitamin D and agrees to not increase her dose unless she discusses this with [name] first. Erin Roach agrees to follow up in 3 weeks as directed.  Obesity Erin Roach is currently in the action stage of change. As such, her goal is to continue with weight loss efforts. She has agreed to keep a food journal with 1500 calories and 80 grams of protein.  Erin Roach has been instructed to work up to a goal of 150 minutes of combined cardio and strengthening  exercise per week for weight loss and overall health benefits. We discussed the following Behavioral Modification Strategies today: increasing lean protein intake, decreasing simple carbohydrates, and work on meal planning and easy cooking plans.  Erin Roach has agreed to follow up with our clinic in 3 weeks. She was informed of the importance of frequent follow up visits to maximize her success with intensive lifestyle modifications for her multiple health conditions.  ALLERGIES: No Known Allergies  MEDICATIONS: Current Outpatient Medications on File Prior to Visit  Medication Sig Dispense Refill  . albuterol (PROAIR HFA) 108 (90 Base) MCG/ACT inhaler Inhale 2 puffs into the lungs every 6 (six) hours as needed for wheezing or shortness of breath. 1 Inhaler 2  . Blood Glucose Monitoring Suppl (ONE TOUCH ULTRA 2) w/Device KIT USE TO TEST BLOOD SUGARS UP TO 4 TIMES DAILY 1 each 0  . budesonide-formoterol (SYMBICORT) 160-4.5 MCG/ACT inhaler Inhale 2 puffs into the lungs daily. 1 Inhaler 1  . Cholecalciferol (VITAMIN D) 2000 units tablet Take 1 tablet (2,000 Units total) by mouth daily.    . fluticasone (FLONASE) 50 MCG/ACT nasal spray Place 1 spray into both nostrils daily. 16 g 0  . furosemide (LASIX) 20 MG tablet TAKE ONE TABLET BY MOUTH DAILY 90 tablet 0  . glucose blood (ACCU-CHEK AVIVA) test strip Testing twice daily 100 each 0  . HORIZANT 600 MG TBCR     . HYDROcodone-acetaminophen (NORCO/VICODIN) 5-325 MG tablet Take 1  tablet by mouth at bedtime. 30 tablet 0  . Insulin Pen Needle (BD PEN NEEDLE NANO 2ND GEN) 32G X 4 MM MISC 1 Package by Does not apply route 2 (two) times daily. 100 each 0  . liraglutide (VICTOZA) 18 MG/3ML SOPN INJECT 1.8 MG TOTAL INTO THE SKIN EVERY MORNING. 3 pen 0  . losartan (COZAAR) 100 MG tablet TAKE ONE TABLET BY MOUTH DAILY 90 tablet 3  . metFORMIN (GLUCOPHAGE) 500 MG tablet TAKE ONE TABLET BY MOUTH TWICE A DAY WITH A MEAL 60 tablet 0  . pramipexole (MIRAPEX) 0.25 MG  tablet Take 0.5 mg by mouth 2 (two) times daily. 1630  And  Bedtime    . pregabalin (LYRICA) 75 MG capsule TAKE ONE CAPSULE BY MOUTH EVERY AFTERNOON AND TAKE TWO CAPSULES BY MOUTH EVERY NIGHT AT BEDTIME 270 capsule 0  . simvastatin (ZOCOR) 40 MG tablet TAKE ONE TABLET BY MOUTH AT BEDTIME 90 tablet 3  . [DISCONTINUED] VESICARE 10 MG tablet TAKE 1 TABLET EVERY DAY 30 tablet 0   No current facility-administered medications on file prior to visit.     PAST MEDICAL HISTORY: Past Medical History:  Diagnosis Date  . Allergic rhinitis   . Apnea   . Arthritis   . Asthma    very mild  . Back pain   . Bilateral lower extremity edema   . Carotid artery disease ([place])    a. Carotid duplex 03/2016 - duplex was stable, 94-85% RICA, 4-62% LICA  . Complication of anesthesia    post-op delirium  . Endometrial polyp   . Essential hypertension   . GERD (gastroesophageal reflux disease)   . Heart murmur    per pt  . History of adenomatous polyp of colon    04-24-2016  tubular adenoma  . History of squamous cell carcinoma excision    pilonidal area second excision 02-02-2002  . HTN (hypertension)   . Hyperlipidemia   . Leg pain   . OAB (overactive bladder)   . RA (rheumatoid arthritis) ([place])   . RLS (restless legs syndrome)   . Shortness of breath on exertion   . SUI (stress urinary incontinence, female)   . Venous insufficiency    legs  . Vitamin D deficiency     PAST SURGICAL HISTORY: Past Surgical History:  Procedure Laterality Date  . APPENDECTOMY  07-02-2005   [name]   open  . CARDIAC CATHETERIZATION  01-23-2009  dr Darnell Level brodie   normal coronary arteries and lvf  . CARDIOVASCULAR STRESS TEST  03/31/2016   Low risk nuclear study w/ medium defect of mild severity in basal anterior and mid anterior location w/ no evidence ischemia or infarction/  normal LV function and wall motion,  nuclear stress ef 71%  . CATARACT EXTRACTION W/ INTRAOCULAR LENS IMPLANT Left 09/2016  .  COLONOSCOPY  last one 04-24-2016  . HYSTEROSCOPY W/D&C N/A 12/22/2016   Procedure: DILATATION AND CURETTAGE /HYSTEROSCOPY;  Surgeon: Dian Queen, MD;  Location: [facility];  Service: Gynecology;  Laterality: N/A;  . IR US GUIDANCE  09/08/2016  . POSTERIOR LUMBAR FUSION  02/ 2018    [facility] ([place])  . SKIN CANCER EXCISION    . TONSILLECTOMY  child  . 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 TRANSTHORACIC ECHOCARDIOGRAM  01/21/2009   mild LVH, ef 70-35%, grade 1 diastolic dysfunction  .  ULNAR NERVE TRANSPOSITION Left 12-20-2008  dr sypher   decompression and partial resection medial triceps fascia  . WIDE LOCAL EXCISION PILONIDAL AREA  02-02-2002  [name]   recurrent squamous cell carcinoma in situ    SOCIAL HISTORY: Social History   Tobacco Use  . Smoking status: Former Smoker    Packs/day: 0.50    Years: 10.00    Pack years: 5.00    Types: Cigarettes    Last attempt to quit: 12/10/1973    Years since quitting: 44.6  . Smokeless tobacco: Never Used  Substance Use Topics  . Alcohol use: Yes    Comment: occasional  . Drug use: No    FAMILY HISTORY: Family History  Problem Relation Age of Onset  . Esophageal cancer Father   . Heart disease Father        Unknown what kind  . Hyperlipidemia Father   . Hypertension Father   . Cancer Father   . Heart failure Father   . Hyperlipidemia Mother   . Hypertension Mother   . CAD Sister        52 years younger than patient - had stent. Was told that her anorexia/bulimia may have played a role  . Stroke Maternal Grandmother 66  . Diabetes Maternal Grandmother   . Heart attack Maternal Grandfather 93  . COPD Maternal Grandfather   . Heart attack Paternal Grandmother        65s  . Diabetes Unknown        MGGM   ROS: Review of Systems  Constitutional:  Positive for weight loss.  Gastrointestinal: Negative for nausea and vomiting.  Musculoskeletal:       Negative for muscle weakness.   PHYSICAL EXAM: Blood pressure (!) 127/58, pulse (!) 103, height '5\' 6"'  (1.676 m), weight 196 lb (88.9 kg), SpO2 100 %. Body mass index is 31.64 kg/m. Physical Exam Vitals signs reviewed.  Constitutional:      Appearance: Normal appearance. She is obese.  Cardiovascular:     Rate and Rhythm: Normal rate.  Pulmonary:     Effort: Pulmonary effort is normal.  Musculoskeletal: Normal range of motion.  Skin:    General: Skin is warm and dry.  Neurological:     Mental Status: She is alert and oriented to person, place, and time.  Psychiatric:        Mood and Affect: Mood normal.        Behavior: Behavior normal.    RECENT LABS AND TESTS: BMET    Component Value Date/Time   NA 140 04/20/2018 1130   K 4.5 04/20/2018 1130   CL 101 04/20/2018 1130   CO2 22 04/20/2018 1130   GLUCOSE 97 04/20/2018 1130   GLUCOSE 147 (H) 12/08/2016 1436   BUN 15 04/20/2018 1130   CREATININE 0.74 04/20/2018 1130   CALCIUM 9.0 04/20/2018 1130   GFRNONAA 80 04/20/2018 1130   GFRAA 92 04/20/2018 1130   Lab Results  Component Value Date   HGBA1C 5.7 (H) 04/20/2018   HGBA1C 5.3 01/06/2018   HGBA1C 5.4 09/28/2017   HGBA1C 5.5 06/09/2017   HGBA1C 8.2 (H) 02/23/2017   Lab Results  Component Value Date   INSULIN 27.9 (H) 04/20/2018   INSULIN 24.6 01/06/2018   INSULIN 17.0 09/28/2017   INSULIN 28.3 (H) 06/09/2017   INSULIN 30.7 (H) 02/23/2017   CBC    Component Value Date/Time   WBC 6.2 09/28/2017 0906   WBC 6.8 12/15/2016 1159   RBC 4.07 09/28/2017 0906  RBC 4.23 12/15/2016 1159   HGB 11.8 09/28/2017 0906   HCT 35.4 09/28/2017 0906   PLT 242 12/15/2016 1159   MCV 87 09/28/2017 0906   MCH 29.0 09/28/2017 0906   MCH 30.3 12/15/2016 1159   MCHC 33.3 09/28/2017 0906   MCHC 34.8 12/15/2016 1159   RDW 14.3 09/28/2017 0906   LYMPHSABS 1.5 09/28/2017 0906    MONOABS 0.5 06/04/2016 0808   EOSABS 0.2 09/28/2017 0906   BASOSABS 0.0 09/28/2017 0906   Iron/TIBC/Ferritin/ %Sat    Component Value Date/Time   IRON 109 03/02/2017 1035   TIBC 333 03/19/2015 1448   FERRITIN 218.0 03/02/2017 1035   IRONPCTSAT 15 03/19/2015 1448   Lipid Panel     Component Value Date/Time   CHOL 226 (H) 04/20/2018 1130   TRIG 216 (H) 04/20/2018 1130   TRIG 101 05/19/2006 0832   HDL 54 04/20/2018 1130   CHOLHDL 4 06/04/2016 0808   VLDL 31.4 06/04/2016 0808   LDLCALC 129 (H) 04/20/2018 1130   LDLDIRECT 141.3 09/06/2012 0907   Hepatic Function Panel     Component Value Date/Time   PROT 6.5 04/20/2018 1130   ALBUMIN 4.2 04/20/2018 1130   AST 16 04/20/2018 1130   ALT 20 04/20/2018 1130   ALKPHOS 88 04/20/2018 1130   BILITOT 0.3 04/20/2018 1130   BILIDIR 0.1 12/04/2013 1724      Component Value Date/Time   TSH 4.050 09/28/2017 0906   TSH 4.810 (H) 02/23/2017 1007   TSH 4.22 06/04/2016 0808   Results for FRADEL, BALDONADO (MRN [id number]) as of 07/19/2018 13:23  Ref. Range 04/20/2018 11:30  Vitamin D, 25-Hydroxy Latest Ref Range: 30.0 - 100.0 ng/mL 46.7   OBESITY BEHAVIORAL INTERVENTION VISIT  Today's visit was # 25   Starting weight: 223 lbs Starting date: 02/23/17 Today's weight : Weight: 196 lb (88.9 kg)  Today's date: 07/19/2018 Total lbs lost to date: 27 At least 15 minutes were spent on discussing the following behavioral intervention visit.    07/19/2018  Height '5\' 6"'  (1.676 m)  Weight 196 lb (88.9 kg)  BMI (Calculated) 31.65  BLOOD PRESSURE - SYSTOLIC 767  BLOOD PRESSURE - DIASTOLIC 58   Body Fat % 34.1 %  Total Body Water (lbs) 85.8 lbs   ASK: We discussed the diagnosis of obesity with Erin Roach today and Erin Roach agreed to give [name] permission to discuss obesity behavioral modification therapy today.  ASSESS: Erin Roach has the diagnosis of obesity and her BMI today is 31.65. Erin Roach is in the action stage of change.   ADVISE: Erin Roach  was educated on the multiple health risks of obesity as well as the benefit of weight loss to improve her health. She was advised of the need for long term treatment and the importance of lifestyle modifications to improve her current health and to decrease her risk of future health problems.  AGREE: Multiple dietary modification options and treatment options were discussed and Mairely agreed to follow the recommendations documented in the above note.  ARRANGE: Erin Roach was educated on the importance of frequent visits to treat obesity as outlined per CMS and USPSTF guidelines and agreed to schedule her next follow up appointment today.  Lenward Chancellor, CMA, am acting as transcriptionist for Starlyn Skeans, MD  I have reviewed the above documentation for accuracy and completeness, and I agree with the above. -Dennard Nip, MD  '

## 2018-07-23 ENCOUNTER — Other Ambulatory Visit (INDEPENDENT_AMBULATORY_CARE_PROVIDER_SITE_OTHER): Payer: Self-pay | Admitting: Family Medicine

## 2018-07-23 DIAGNOSIS — E1129 Type 2 diabetes mellitus with other diabetic kidney complication: Secondary | ICD-10-CM

## 2018-07-23 DIAGNOSIS — R809 Proteinuria, unspecified: Secondary | ICD-10-CM

## 2018-07-23 DIAGNOSIS — E559 Vitamin D deficiency, unspecified: Secondary | ICD-10-CM

## 2018-08-01 ENCOUNTER — Encounter (INDEPENDENT_AMBULATORY_CARE_PROVIDER_SITE_OTHER): Payer: Self-pay | Admitting: Family Medicine

## 2018-08-01 ENCOUNTER — Other Ambulatory Visit (INDEPENDENT_AMBULATORY_CARE_PROVIDER_SITE_OTHER): Payer: Self-pay

## 2018-08-01 ENCOUNTER — Encounter: Payer: Self-pay | Admitting: Internal Medicine

## 2018-08-01 DIAGNOSIS — E118 Type 2 diabetes mellitus with unspecified complications: Secondary | ICD-10-CM

## 2018-08-01 MED ORDER — ONETOUCH ULTRA 2 W/DEVICE KIT: PACK | 0 refills | Status: DC

## 2018-08-03 ENCOUNTER — Other Ambulatory Visit (INDEPENDENT_AMBULATORY_CARE_PROVIDER_SITE_OTHER): Payer: Self-pay | Admitting: Family Medicine

## 2018-08-03 ENCOUNTER — Encounter (INDEPENDENT_AMBULATORY_CARE_PROVIDER_SITE_OTHER): Payer: Self-pay

## 2018-08-03 DIAGNOSIS — R809 Proteinuria, unspecified: Principal | ICD-10-CM

## 2018-08-03 DIAGNOSIS — E1129 Type 2 diabetes mellitus with other diabetic kidney complication: Secondary | ICD-10-CM

## 2018-08-07 ENCOUNTER — Other Ambulatory Visit: Payer: Self-pay | Admitting: Internal Medicine

## 2018-08-07 ENCOUNTER — Encounter: Payer: Self-pay | Admitting: Internal Medicine

## 2018-08-07 DIAGNOSIS — J45909 Unspecified asthma, uncomplicated: Secondary | ICD-10-CM

## 2018-08-07 DIAGNOSIS — J069 Acute upper respiratory infection, unspecified: Secondary | ICD-10-CM

## 2018-08-07 DIAGNOSIS — B9789 Other viral agents as the cause of diseases classified elsewhere: Principal | ICD-10-CM

## 2018-08-08 ENCOUNTER — Encounter: Payer: Self-pay | Admitting: Internal Medicine

## 2018-08-08 MED ORDER — HYDROCODONE-ACETAMINOPHEN 5-325 MG PO TABS: 1.0000 | ORAL_TABLET | Freq: Every day | ORAL | 0 refills | Status: DC

## 2018-08-08 MED ORDER — BUDESONIDE-FORMOTEROL FUMARATE 160-4.5 MCG/ACT IN AERO: 2.0000 | INHALATION_SPRAY | Freq: Every day | RESPIRATORY_TRACT | 5 refills | Status: DC

## 2018-08-08 MED ORDER — ALBUTEROL SULFATE HFA 108 (90 BASE) MCG/ACT IN AERS: 2.0000 | INHALATION_SPRAY | Freq: Four times a day (QID) | RESPIRATORY_TRACT | 5 refills | Status: DC | PRN

## 2018-08-11 ENCOUNTER — Encounter (INDEPENDENT_AMBULATORY_CARE_PROVIDER_SITE_OTHER): Payer: Self-pay | Admitting: Family Medicine

## 2018-08-11 ENCOUNTER — Ambulatory Visit (INDEPENDENT_AMBULATORY_CARE_PROVIDER_SITE_OTHER): Payer: Medicare Other | Admitting: Family Medicine

## 2018-08-11 ENCOUNTER — Other Ambulatory Visit: Payer: Self-pay

## 2018-08-11 DIAGNOSIS — E119 Type 2 diabetes mellitus without complications: Secondary | ICD-10-CM

## 2018-08-11 DIAGNOSIS — E559 Vitamin D deficiency, unspecified: Secondary | ICD-10-CM | POA: Diagnosis not present

## 2018-08-11 DIAGNOSIS — Z6831 Body mass index (BMI) 31.0-31.9, adult: Secondary | ICD-10-CM

## 2018-08-11 DIAGNOSIS — E669 Obesity, unspecified: Secondary | ICD-10-CM | POA: Diagnosis not present

## 2018-08-11 MED ORDER — METFORMIN HCL 500 MG PO TABS: ORAL_TABLET | ORAL | 0 refills | Status: DC

## 2018-08-11 MED ORDER — VITAMIN D (ERGOCALCIFEROL) 1.25 MG (50000 UNIT) PO CAPS: 50000.0000 [IU] | ORAL_CAPSULE | ORAL | 0 refills | Status: DC

## 2018-08-11 MED ORDER — GLUCOSE BLOOD VI STRP: ORAL_STRIP | 0 refills | Status: DC

## 2018-08-11 MED ORDER — LIRAGLUTIDE 18 MG/3ML ~~LOC~~ SOPN: PEN_INJECTOR | SUBCUTANEOUS | 0 refills | Status: DC

## 2018-08-11 NOTE — Progress Notes (Signed)
Office: [phone]  /  Fax: [phone] TeleHealth Visit:  Erin Roach has verbally consented to this TeleHealth visit today. The patient is located at home, the provider is located at the [facility] office. The participants in this visit include the listed provider and patient. The visit was conducted today via Face Time.  HPI:   Chief Complaint: OBESITY Erin Roach is here to discuss her progress with her obesity treatment plan. She is keeping a food journal with 1500 calories and 80 grams of protein and is following her eating plan approximately 50 % of the time. She states she is walking and riding a bike for 15 to 30 minutes 5 times per week. Erin Roach feels that she has been doing well with her eating. She is keeping busy during [place] isolation and is doing more yard and house work. She is sleeping better overall. Erin Roach still struggles to meet her protein goals, but is mindful of this.  We were unable to weigh the patient today for this TeleHealth visit. She feels as if she may have lost weight since her last visit. She has lost 27 lbs since starting treatment with [name].  Diabetes II Erin Roach has a diagnosis of diabetes type II. Erin Roach's last A1c was 5.7 on 04/20/18 and was well controlled on Victoza, metformin, and her diet. She has been working on intensive lifestyle modifications including diet, exercise, and weight loss to help control her blood glucose levels. She denies any nausea, vomiting, or hypoglycemic episodes.   Vitamin D Deficiency Erin Roach has a diagnosis of vitamin D deficiency. She is currently stable on vit D and is not yet at goal, but is improving. Erin Roach denies nausea, vomiting, or muscle weakness.  ASSESSMENT AND PLAN:  Vitamin D deficiency - Plan: Vitamin D, Ergocalciferol, (DRISDOL) 1.25 MG (50000 UT) CAPS capsule  Type 2 diabetes mellitus without complication, without long-term current use of insulin (HCC) - Plan: liraglutide (VICTOZA) 18 MG/3ML SOPN,  glucose blood (ACCU-CHEK AVIVA) test strip  Class 1 obesity with serious comorbidity and body mass index (BMI) of 31.0 to 31.9 in adult, unspecified obesity type  PLAN:  Diabetes II Erin Roach has been given extensive diabetes education by myself today including ideal fasting and post-prandial blood glucose readings, individual ideal Hgb A1c goals, and hypoglycemia prevention. We discussed the importance of good blood sugar control to decrease the likelihood of diabetic complications such as nephropathy, neuropathy, limb loss, blindness, coronary artery disease, and death. We discussed the importance of intensive lifestyle modification including diet, exercise, and weight loss as the first line treatment for diabetes. Erin Roach agrees to continue her Victoza 1.8 mg qd #3 pens, metformin 523m BID #60, and test strips #100 with no refills. Erin Roach with this plan and will follow up at the agreed upon time in 3 weeks.  Vitamin D Deficiency Erin Roach informed that low vitamin D levels contribute to fatigue and are associated with obesity, breast, and colon cancer. Erin Roach to continue to take prescription Vit D _0 ,000 IU every week #12 with no refills and will follow up for routine testing of vitamin D, at least 2-3 times per year. She was informed of the risk of over-replacement of vitamin D and agrees to not increase her dose unless she discusses this with [name]. Erin Roach to follow up in 3 weeks as directed.  Obesity Erin Roach currently in the action stage of change. As such, her goal is to continue with weight loss efforts. She has agreed to  keep a food journal with 1500 calories and 80 grams of protein.  Erin Roach has been instructed to work up to a goal of 150 minutes of combined cardio and strengthening exercise per week for weight loss and overall health benefits. We discussed the following Behavioral Modification Strategies today: increasing lean protein intake, work on meal planning and easy  cooking plans, emotional eating strategies, keeping healthy foods in the home, and ways to avoid boredom eating.  Erin Roach has agreed to follow up with our clinic in 3 weeks. She was informed of the importance of frequent follow up visits to maximize her success with intensive lifestyle modifications for her multiple health conditions.  ALLERGIES: No Known Allergies  MEDICATIONS: Current Outpatient Medications on File Prior to Visit  Medication Sig Dispense Refill  . albuterol (PROAIR HFA) 108 (90 Base) MCG/ACT inhaler Inhale 2 puffs into the lungs every 6 (six) hours as needed for wheezing or shortness of breath. 1 Inhaler 5  . Blood Glucose Monitoring Suppl (ONE TOUCH ULTRA 2) w/Device KIT USE TO TEST BLOOD SUGARS UP TO 4 TIMES DAILY 1 each 0  . budesonide-formoterol (SYMBICORT) 160-4.5 MCG/ACT inhaler Inhale 2 puffs into the lungs daily. 1 Inhaler 5  . Cholecalciferol (VITAMIN D) 2000 units tablet Take 1 tablet (2,000 Units total) by mouth daily.    . fluticasone (FLONASE) 50 MCG/ACT nasal spray Place 1 spray into both nostrils daily. 16 g 0  . furosemide (LASIX) 20 MG tablet TAKE ONE TABLET BY MOUTH DAILY 90 tablet 1  . HORIZANT 600 MG TBCR     . HYDROcodone-acetaminophen (NORCO/VICODIN) 5-325 MG tablet Take 1 tablet by mouth at bedtime. 30 tablet 0  . Insulin Pen Needle (BD PEN NEEDLE NANO 2ND GEN) 32G X 4 MM MISC 1 Package by Does not apply route 2 (two) times daily. 100 each 0  . losartan (COZAAR) 100 MG tablet TAKE ONE TABLET BY MOUTH DAILY 90 tablet 3  . metFORMIN (GLUCOPHAGE) 500 MG tablet TAKE 1 TABLET BY MOUTH TWICE A DAY WITH A MEAL. 60 tablet 0  . pramipexole (MIRAPEX) 0.25 MG tablet Take 0.5 mg by mouth 2 (two) times daily. 1630  And  Bedtime    . pregabalin (LYRICA) 75 MG capsule TAKE ONE CAPSULE BY MOUTH EVERY AFTERNOON AND TAKE TWO CAPSULES BY MOUTH EVERY NIGHT AT BEDTIME 270 capsule 0  . simvastatin (ZOCOR) 40 MG tablet TAKE ONE TABLET BY MOUTH AT BEDTIME 90 tablet 3  .  [DISCONTINUED] VESICARE 10 MG tablet TAKE 1 TABLET EVERY DAY 30 tablet 0   No current facility-administered medications on file prior to visit.     PAST MEDICAL HISTORY: Past Medical History:  Diagnosis Date  . Allergic rhinitis   . Apnea   . Arthritis   . Asthma    very mild  . Back pain   . Bilateral lower extremity edema   . Carotid artery disease ([place])    a. Carotid duplex 03/2016 - duplex was stable, 95-63% RICA, 8-75% LICA  . Complication of anesthesia    post-op delirium  . Endometrial polyp   . Essential hypertension   . GERD (gastroesophageal reflux disease)   . Heart murmur    per pt  . History of adenomatous polyp of colon    04-24-2016  tubular adenoma  . History of squamous cell carcinoma excision    pilonidal area second excision 02-02-2002  . HTN (hypertension)   . Hyperlipidemia   . Leg pain   . OAB (overactive bladder)   .  RA (rheumatoid arthritis) ([place])   . RLS (restless legs syndrome)   . Shortness of breath on exertion   . SUI (stress urinary incontinence, female)   . Venous insufficiency    legs  . Vitamin D deficiency     PAST SURGICAL HISTORY: Past Surgical History:  Procedure Laterality Date  . APPENDECTOMY  07-02-2005   [name]   open  . CARDIAC CATHETERIZATION  01-23-2009  dr Darnell Level brodie   normal coronary arteries and lvf  . CARDIOVASCULAR STRESS TEST  03/31/2016   Low risk nuclear study w/ medium defect of mild severity in basal anterior and mid anterior location w/ no evidence ischemia or infarction/  normal LV function and wall motion,  nuclear stress ef 71%  . CATARACT EXTRACTION W/ INTRAOCULAR LENS IMPLANT Left 09/2016  . COLONOSCOPY  last one 04-24-2016  . HYSTEROSCOPY W/D&C N/A 12/22/2016   Procedure: DILATATION AND CURETTAGE /HYSTEROSCOPY;  Surgeon: Dian Queen, MD;  Location: [facility];  Service: Gynecology;  Laterality: N/A;  . IR US GUIDANCE  09/08/2016  . POSTERIOR LUMBAR FUSION  02/ 2018    [facility] ([place])  . SKIN CANCER EXCISION    . TONSILLECTOMY  child  . 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 TRANSTHORACIC ECHOCARDIOGRAM  01/21/2009   mild LVH, ef 87-56%, grade 1 diastolic dysfunction  . ULNAR NERVE TRANSPOSITION Left 12-20-2008  dr sypher   decompression and partial resection medial triceps fascia  . WIDE LOCAL EXCISION PILONIDAL AREA  02-02-2002  [name]   recurrent squamous cell carcinoma in situ    SOCIAL HISTORY: Social History   Tobacco Use  . Smoking status: Former Smoker    Packs/day: 0.50    Years: 10.00    Pack years: 5.00    Types: Cigarettes    Last attempt to quit: 12/10/1973    Years since quitting: 44.6  . Smokeless tobacco: Never Used  Substance Use Topics  . Alcohol use: Yes    Comment: occasional  . Drug use: No    FAMILY HISTORY: Family History  Problem Relation Age of Onset  . Esophageal cancer Father   . Heart disease Father        Unknown what kind  . Hyperlipidemia Father   . Hypertension Father   . Cancer Father   . Heart failure Father   . Hyperlipidemia Mother   . Hypertension Mother   . CAD Sister        48 years younger than patient - had stent. Was told that her anorexia/bulimia may have played a role  . Stroke Maternal Grandmother 66  . Diabetes Maternal Grandmother   . Heart attack Maternal Grandfather 93  . COPD Maternal Grandfather   . Heart attack Paternal Grandmother        55s  . Diabetes Unknown        MGGM    ROS: Review of Systems  Gastrointestinal: Negative for nausea and vomiting.  Musculoskeletal:       Negative for muscle weakness.  Endo/Heme/Allergies:       Negative for hypoglycemia.    PHYSICAL EXAM: Pt in no acute distress  RECENT LABS AND TESTS: BMET    Component Value Date/Time   NA 140 04/20/2018 1130   K 4.5 04/20/2018 1130   CL  101 04/20/2018 1130   CO2 22 04/20/2018  1130   GLUCOSE 97 04/20/2018 1130   GLUCOSE 147 (H) 12/08/2016 1436   BUN 15 04/20/2018 1130   CREATININE 0.74 04/20/2018 1130   CALCIUM 9.0 04/20/2018 1130   GFRNONAA 80 04/20/2018 1130   GFRAA 92 04/20/2018 1130   Lab Results  Component Value Date   HGBA1C 5.7 (H) 04/20/2018   HGBA1C 5.3 01/06/2018   HGBA1C 5.4 09/28/2017   HGBA1C 5.5 06/09/2017   HGBA1C 8.2 (H) 02/23/2017   Lab Results  Component Value Date   INSULIN 27.9 (H) 04/20/2018   INSULIN 24.6 01/06/2018   INSULIN 17.0 09/28/2017   INSULIN 28.3 (H) 06/09/2017   INSULIN 30.7 (H) 02/23/2017   CBC    Component Value Date/Time   WBC 6.2 09/28/2017 0906   WBC 6.8 12/15/2016 1159   RBC 4.07 09/28/2017 0906   RBC 4.23 12/15/2016 1159   HGB 11.8 09/28/2017 0906   HCT 35.4 09/28/2017 0906   PLT 242 12/15/2016 1159   MCV 87 09/28/2017 0906   MCH 29.0 09/28/2017 0906   MCH 30.3 12/15/2016 1159   MCHC 33.3 09/28/2017 0906   MCHC 34.8 12/15/2016 1159   RDW 14.3 09/28/2017 0906   LYMPHSABS 1.5 09/28/2017 0906   MONOABS 0.5 06/04/2016 0808   EOSABS 0.2 09/28/2017 0906   BASOSABS 0.0 09/28/2017 0906   Iron/TIBC/Ferritin/ %Sat    Component Value Date/Time   IRON 109 03/02/2017 1035   TIBC 333 03/19/2015 1448   FERRITIN 218.0 03/02/2017 1035   IRONPCTSAT 15 03/19/2015 1448   Lipid Panel     Component Value Date/Time   CHOL 226 (H) 04/20/2018 1130   TRIG 216 (H) 04/20/2018 1130   TRIG 101 05/19/2006 0832   HDL 54 04/20/2018 1130   CHOLHDL 4 06/04/2016 0808   VLDL 31.4 06/04/2016 0808   LDLCALC 129 (H) 04/20/2018 1130   LDLDIRECT 141.3 09/06/2012 0907   Hepatic Function Panel     Component Value Date/Time   PROT 6.5 04/20/2018 1130   ALBUMIN 4.2 04/20/2018 1130   AST 16 04/20/2018 1130   ALT 20 04/20/2018 1130   ALKPHOS 88 04/20/2018 1130   BILITOT 0.3 04/20/2018 1130   BILIDIR 0.1 12/04/2013 1724      Component Value Date/Time   TSH 4.050 09/28/2017 0906    TSH 4.810 (H) 02/23/2017 1007   TSH 4.22 06/04/2016 0808    Results for JERRIANN, SCHROM (MRN [id number]) as of 08/11/2018 14:53  Ref. Range 04/20/2018 11:30  Vitamin D, 25-Hydroxy Latest Ref Range: 30.0 - 100.0 ng/mL 46.7    I, Marcille Blanco, CMA, am acting as transcriptionist for Starlyn Skeans, MD I have reviewed the above documentation for accuracy and completeness, and I agree with the above. -Dennard Nip, MD

## 2018-08-12 ENCOUNTER — Other Ambulatory Visit (INDEPENDENT_AMBULATORY_CARE_PROVIDER_SITE_OTHER): Payer: Self-pay | Admitting: Family Medicine

## 2018-08-12 DIAGNOSIS — E119 Type 2 diabetes mellitus without complications: Secondary | ICD-10-CM

## 2018-08-12 DIAGNOSIS — E559 Vitamin D deficiency, unspecified: Secondary | ICD-10-CM

## 2018-08-14 ENCOUNTER — Other Ambulatory Visit (INDEPENDENT_AMBULATORY_CARE_PROVIDER_SITE_OTHER): Payer: Self-pay | Admitting: Family Medicine

## 2018-08-14 DIAGNOSIS — E559 Vitamin D deficiency, unspecified: Secondary | ICD-10-CM

## 2018-08-31 ENCOUNTER — Other Ambulatory Visit (INDEPENDENT_AMBULATORY_CARE_PROVIDER_SITE_OTHER): Payer: Self-pay | Admitting: Family Medicine

## 2018-08-31 DIAGNOSIS — E119 Type 2 diabetes mellitus without complications: Secondary | ICD-10-CM

## 2018-09-06 ENCOUNTER — Encounter (INDEPENDENT_AMBULATORY_CARE_PROVIDER_SITE_OTHER): Payer: Self-pay | Admitting: Family Medicine

## 2018-09-06 ENCOUNTER — Other Ambulatory Visit (INDEPENDENT_AMBULATORY_CARE_PROVIDER_SITE_OTHER): Payer: Self-pay | Admitting: Family Medicine

## 2018-09-06 ENCOUNTER — Ambulatory Visit (INDEPENDENT_AMBULATORY_CARE_PROVIDER_SITE_OTHER): Payer: Medicare Other | Admitting: Family Medicine

## 2018-09-06 ENCOUNTER — Other Ambulatory Visit: Payer: Self-pay

## 2018-09-06 DIAGNOSIS — E669 Obesity, unspecified: Secondary | ICD-10-CM

## 2018-09-06 DIAGNOSIS — G4709 Other insomnia: Secondary | ICD-10-CM

## 2018-09-06 DIAGNOSIS — E119 Type 2 diabetes mellitus without complications: Secondary | ICD-10-CM

## 2018-09-06 DIAGNOSIS — E559 Vitamin D deficiency, unspecified: Secondary | ICD-10-CM | POA: Diagnosis not present

## 2018-09-06 DIAGNOSIS — Z6831 Body mass index (BMI) 31.0-31.9, adult: Secondary | ICD-10-CM

## 2018-09-06 MED ORDER — VITAMIN D (ERGOCALCIFEROL) 1.25 MG (50000 UNIT) PO CAPS: 50000.0000 [IU] | ORAL_CAPSULE | ORAL | 0 refills | Status: DC

## 2018-09-06 MED ORDER — GLUCOSE BLOOD VI STRP: ORAL_STRIP | 0 refills | Status: DC

## 2018-09-06 MED ORDER — LIRAGLUTIDE 18 MG/3ML ~~LOC~~ SOPN: PEN_INJECTOR | SUBCUTANEOUS | 0 refills | Status: DC

## 2018-09-06 MED ORDER — ZOLPIDEM TARTRATE 5 MG PO TABS: ORAL_TABLET | ORAL | 0 refills | Status: DC

## 2018-09-06 MED ORDER — METFORMIN HCL 500 MG PO TABS: ORAL_TABLET | ORAL | 0 refills | Status: DC

## 2018-09-06 MED ORDER — INSULIN PEN NEEDLE 32G X 4 MM MISC: 1.0000 | Freq: Two times a day (BID) | 0 refills | Status: DC

## 2018-09-06 NOTE — Progress Notes (Signed)
Office: [phone]  /  Fax: [phone] TeleHealth Visit:  Erin Roach has verbally consented to this TeleHealth visit today. The patient is located at home, the provider is located at the [facility] office. The participants in this visit include the listed provider and patient. Erin Roach was unable to use Face Time today and the telehealth visit was conducted via telephone.  HPI:   Chief Complaint: OBESITY [name] is here to discuss her progress with her obesity treatment plan. She is keeping a food journal with 1500 calories and 80 grams of protein and is following her eating plan approximately 85 % of the time. She states she is riding the stationary bike and gardening 20 minutes 2 to 3 times per week. Erin Roach feels that she continues to do well with weight loss and is keeping her journal regularly. Erin Roach is keeping herself busy to help avoid boredom snacking. She struggles to meet her protein goal, but is still making the effort.  We were unable to weigh the patient today for this TeleHealth visit. She feels as if she has lost some weight since her last visit. She has lost 27 lbs since starting treatment with [name].  Diabetes II Erin Roach has a diagnosis of diabetes type II. Erin Roach states that she has not checked her blood sugars recently and that she needs some new strips. Her last A1c was 5.7 on 04/20/18 and is well controlled. She has been working on intensive lifestyle modifications including diet, exercise, and weight loss to help control her blood glucose levels. She denies any nausea, vomiting, or hypoglycemic episodes.  Vitamin D Deficiency Erin Roach has a diagnosis of vitamin D deficiency. She is currently stable on vit D, but is not yet at goal. Erin Roach denies nausea, vomiting, or muscle weakness.  Insomnia Erin Roach has not slept well again recently. She took low dose Ambian occasionally in the past and did not have any problems with day time somnolence or confusion. She asks for a  few more to help.  ASSESSMENT AND PLAN:  Type 2 diabetes mellitus without complication, without long-term current use of insulin (HCC)  Vitamin D deficiency  Other insomnia  Class 1 obesity with serious comorbidity and body mass index (BMI) of 31.0 to 31.9 in adult, unspecified obesity type  PLAN:  Diabetes II [name] has been given extensive diabetes education by myself today including ideal fasting and post-prandial blood glucose readings, individual ideal Hgb A1c goals, and hypoglycemia prevention. We discussed the importance of good blood sugar control to decrease the likelihood of diabetic complications such as nephropathy, neuropathy, limb loss, blindness, coronary artery disease, and death. We discussed the importance of intensive lifestyle modification including diet, exercise and weight loss as the first line treatment for diabetes. Erin Roach agrees to continue her metformin 500 mg BID #60, Victoza 1.8 mg qd #3 pens, test strips for testing twice daily #100, and nano needles #100 with no refills. Erin Roach will follow up at the agreed upon time in 3 weeks.  Vitamin D Deficiency Erin Roach was informed that low vitamin D levels contribute to fatigue and are associated with obesity, breast, and colon cancer. Erin Roach agrees to continue to take prescription Vit D '@50'$ ,000 IU every week #4 with no refills and will follow up for routine testing of vitamin D, at least 2-3 times per year. She was informed of the risk of over-replacement of vitamin D and agrees to not increase her dose unless she discusses this with [name] first. Erin Roach agrees to follow up  in 3 weeks as directed.  Insomnia We discussed the risks of Ambian at her age and the importance of taking sparingly. Erin Roach agrees to start Ambian 5 mg 1/2 tablet PO qhs as needed #10 with no refills. Erin Roach will follow up at the agreed upon time in 3 weeks.  Obesity Erin Roach is currently in the action stage of change. As such, her goal is to continue with weight  loss efforts. She has agreed to keep a food journal with 1500 calories and 80 grams of protein.  Erin Roach has been instructed to work up to a goal of 150 minutes of combined cardio and strengthening exercise per week for weight loss and overall health benefits. We discussed the following Behavioral Modification Strategies today: increasing lean protein intake, decreasing simple carbohydrates, and work on meal planning and easy cooking plans.  Erin Roach has agreed to follow up with our clinic in 3 weeks. She was informed of the importance of frequent follow up visits to maximize her success with intensive lifestyle modifications for her multiple health conditions.  ALLERGIES: No Known Allergies  MEDICATIONS: Current Outpatient Medications on File Prior to Visit  Medication Sig Dispense Refill  . albuterol (PROAIR HFA) 108 (90 Base) MCG/ACT inhaler Inhale 2 puffs into the lungs every 6 (six) hours as needed for wheezing or shortness of breath. 1 Inhaler 5  . Blood Glucose Monitoring Suppl (ONE TOUCH ULTRA 2) w/Device KIT USE TO TEST BLOOD SUGARS UP TO 4 TIMES DAILY 1 each 0  . budesonide-formoterol (SYMBICORT) 160-4.5 MCG/ACT inhaler Inhale 2 puffs into the lungs daily. 1 Inhaler 5  . Cholecalciferol (VITAMIN D) 2000 units tablet Take 1 tablet (2,000 Units total) by mouth daily.    . fluticasone (FLONASE) 50 MCG/ACT nasal spray Place 1 spray into both nostrils daily. 16 g 0  . furosemide (LASIX) 20 MG tablet TAKE ONE TABLET BY MOUTH DAILY 90 tablet 1  . glucose blood (ACCU-CHEK AVIVA) test strip Testing twice daily 100 each 0  . HORIZANT 600 MG TBCR     . HYDROcodone-acetaminophen (NORCO/VICODIN) 5-325 MG tablet Take 1 tablet by mouth at bedtime. 30 tablet 0  . Insulin Pen Needle (BD PEN NEEDLE NANO 2ND GEN) 32G X 4 MM MISC 1 Package by Does not apply route 2 (two) times daily. 100 each 0  . liraglutide (VICTOZA) 18 MG/3ML SOPN DIAL AND INJECT SUBCUTANEOUSLY 1.'8MG'$  DAILY EVERY MORNING 3 pen 0  .  losartan (COZAAR) 100 MG tablet TAKE ONE TABLET BY MOUTH DAILY 90 tablet 3  . metFORMIN (GLUCOPHAGE) 500 MG tablet TAKE 1 TABLET BY MOUTH TWICE A DAY WITH A MEAL. 60 tablet 0  . pramipexole (MIRAPEX) 0.25 MG tablet Take 0.5 mg by mouth 2 (two) times daily. 1630  And  Bedtime    . pregabalin (LYRICA) 75 MG capsule TAKE ONE CAPSULE BY MOUTH EVERY AFTERNOON AND TAKE TWO CAPSULES BY MOUTH EVERY NIGHT AT BEDTIME 270 capsule 0  . simvastatin (ZOCOR) 40 MG tablet TAKE ONE TABLET BY MOUTH AT BEDTIME 90 tablet 3  . Vitamin D, Ergocalciferol, (DRISDOL) 1.25 MG (50000 UT) CAPS capsule Take 1 capsule (50,000 Units total) by mouth every 7 (seven) days. 12 capsule 0  . [DISCONTINUED] VESICARE 10 MG tablet TAKE 1 TABLET EVERY DAY 30 tablet 0   No current facility-administered medications on file prior to visit.     PAST MEDICAL HISTORY: Past Medical History:  Diagnosis Date  . Allergic rhinitis   . Apnea   . Arthritis   .  Asthma    very mild  . Back pain   . Bilateral lower extremity edema   . Carotid artery disease ([place])    a. Carotid duplex 03/2016 - duplex was stable, 84-69% RICA, 6-29% LICA  . Complication of anesthesia    post-op delirium  . Endometrial polyp   . Essential hypertension   . GERD (gastroesophageal reflux disease)   . Heart murmur    per pt  . History of adenomatous polyp of colon    04-24-2016  tubular adenoma  . History of squamous cell carcinoma excision    pilonidal area second excision 02-02-2002  . HTN (hypertension)   . Hyperlipidemia   . Leg pain   . OAB (overactive bladder)   . RA (rheumatoid arthritis) ([place])   . RLS (restless legs syndrome)   . Shortness of breath on exertion   . SUI (stress urinary incontinence, female)   . Venous insufficiency    legs  . Vitamin D deficiency     PAST SURGICAL HISTORY: Past Surgical History:  Procedure Laterality Date  . APPENDECTOMY  07-02-2005   [name]   open  . CARDIAC CATHETERIZATION  01-23-2009  dr Darnell Level  [place]   normal coronary arteries and lvf  . CARDIOVASCULAR STRESS TEST  03/31/2016   Low risk nuclear study w/ medium defect of mild severity in basal anterior and mid anterior location w/ no evidence ischemia or infarction/  normal LV function and wall motion,  nuclear stress ef 71%  . CATARACT EXTRACTION W/ INTRAOCULAR LENS IMPLANT Left 09/2016  . COLONOSCOPY  last one 04-24-2016  . HYSTEROSCOPY W/D&C N/A 12/22/2016   Procedure: DILATATION AND CURETTAGE /HYSTEROSCOPY;  Surgeon: Dian Queen, MD;  Location[facility];  Service: Gynecology;  Laterality: N/A;  . IR US GUIDANCE  09/08/2016  . POSTERIOR LUMBAR FUSION  02/ 2018    [facility] ([place])  . SKIN CANCER EXCISION    . TONSILLECTOMY  child  . 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 TRANSTHORACIC ECHOCARDIOGRAM  01/21/2009   mild LVH, ef 52-84%, grade 1 diastolic dysfunction  . ULNAR NERVE TRANSPOSITION Left 12-20-2008  dr sypher   decompression and partial resection medial triceps fascia  . WIDE LOCAL EXCISION PILONIDAL AREA  02-02-2002  [name]   recurrent squamous cell carcinoma in situ    SOCIAL HISTORY: Social History   Tobacco Use  . Smoking status: Former Smoker    Packs/day: 0.50    Years: 10.00    Pack years: 5.00    Types: Cigarettes    Last attempt to quit: 12/10/1973    Years since quitting: 44.7  . Smokeless tobacco: Never Used  Substance Use Topics  . Alcohol use: Yes    Comment: occasional  . Drug use: No    FAMILY HISTORY: Family History  Problem Relation Age of Onset  . Esophageal cancer Father   . Heart disease Father        Unknown what kind  . Hyperlipidemia Father   . Hypertension Father   . Cancer Father   . Heart failure Father   . Hyperlipidemia Mother   . Hypertension Mother   . CAD Sister        15 years younger than  patient - had stent. Was told that her anorexia/bulimia may have played a role  . Stroke Maternal Grandmother  59  . Diabetes Maternal Grandmother   . Heart attack Maternal Grandfather 93  . COPD Maternal Grandfather   . Heart attack Paternal Grandmother        110s  . Diabetes Unknown        MGGM    ROS: Review of Systems  Gastrointestinal: Negative for nausea and vomiting.  Musculoskeletal:       Negative for muscle weakness.  Endo/Heme/Allergies:       Negative for hypoglycemia.  Psychiatric/Behavioral: The patient has insomnia.     PHYSICAL EXAM: Pt in no acute distress  RECENT LABS AND TESTS: BMET    Component Value Date/Time   NA 140 04/20/2018 1130   K 4.5 04/20/2018 1130   CL 101 04/20/2018 1130   CO2 22 04/20/2018 1130   GLUCOSE 97 04/20/2018 1130   GLUCOSE 147 (H) 12/08/2016 1436   BUN 15 04/20/2018 1130   CREATININE 0.74 04/20/2018 1130   CALCIUM 9.0 04/20/2018 1130   GFRNONAA 80 04/20/2018 1130   GFRAA 92 04/20/2018 1130   Lab Results  Component Value Date   HGBA1C 5.7 (H) 04/20/2018   HGBA1C 5.3 01/06/2018   HGBA1C 5.4 09/28/2017   HGBA1C 5.5 06/09/2017   HGBA1C 8.2 (H) 02/23/2017   Lab Results  Component Value Date   INSULIN 27.9 (H) 04/20/2018   INSULIN 24.6 01/06/2018   INSULIN 17.0 09/28/2017   INSULIN 28.3 (H) 06/09/2017   INSULIN 30.7 (H) 02/23/2017   CBC    Component Value Date/Time   WBC 6.2 09/28/2017 0906   WBC 6.8 12/15/2016 1159   RBC 4.07 09/28/2017 0906   RBC 4.23 12/15/2016 1159   HGB 11.8 09/28/2017 0906   HCT 35.4 09/28/2017 0906   PLT 242 12/15/2016 1159   MCV 87 09/28/2017 0906   MCH 29.0 09/28/2017 0906   MCH 30.3 12/15/2016 1159   MCHC 33.3 09/28/2017 0906   MCHC 34.8 12/15/2016 1159   RDW 14.3 09/28/2017 0906   LYMPHSABS 1.5 09/28/2017 0906   MONOABS 0.5 06/04/2016 0808   EOSABS 0.2 09/28/2017 0906   BASOSABS 0.0 09/28/2017 0906   Iron/TIBC/Ferritin/ %Sat    Component Value Date/Time   IRON 109 03/02/2017  1035   TIBC 333 03/19/2015 1448   FERRITIN 218.0 03/02/2017 1035   IRONPCTSAT 15 03/19/2015 1448   Lipid Panel     Component Value Date/Time   CHOL 226 (H) 04/20/2018 1130   TRIG 216 (H) 04/20/2018 1130   TRIG 101 05/19/2006 0832   HDL 54 04/20/2018 1130   CHOLHDL 4 06/04/2016 0808   VLDL 31.4 06/04/2016 0808   LDLCALC 129 (H) 04/20/2018 1130   LDLDIRECT 141.3 09/06/2012 0907   Hepatic Function Panel     Component Value Date/Time   PROT 6.5 04/20/2018 1130   ALBUMIN 4.2 04/20/2018 1130   AST 16 04/20/2018 1130   ALT 20 04/20/2018 1130   ALKPHOS 88 04/20/2018 1130   BILITOT 0.3 04/20/2018 1130   BILIDIR 0.1 12/04/2013 1724      Component Value Date/Time   TSH 4.050 09/28/2017 0906   TSH 4.810 (H) 02/23/2017 1007   TSH 4.22 06/04/2016 0808    Results for GAYNOR, GENCO (MRN [id number]) as of 09/06/2018 12:01  Ref. Range 04/20/2018 11:30  Vitamin D, 25-Hydroxy Latest Ref Range: 30.0 - 100.0 ng/mL 46.7    I, Marcille Blanco, CMA, am acting as transcriptionist for Starlyn Skeans, MD I have reviewed the above documentation for accuracy and completeness, and I agree with  the above. -Dennard Nip, MD

## 2018-09-10 ENCOUNTER — Other Ambulatory Visit (INDEPENDENT_AMBULATORY_CARE_PROVIDER_SITE_OTHER): Payer: Self-pay | Admitting: Family Medicine

## 2018-09-10 DIAGNOSIS — E119 Type 2 diabetes mellitus without complications: Secondary | ICD-10-CM

## 2018-09-13 ENCOUNTER — Encounter: Payer: Self-pay | Admitting: Internal Medicine

## 2018-09-13 MED ORDER — HYDROCODONE-ACETAMINOPHEN 5-325 MG PO TABS: 1.0000 | ORAL_TABLET | Freq: Every day | ORAL | 0 refills | Status: DC

## 2018-09-15 ENCOUNTER — Other Ambulatory Visit: Payer: Self-pay | Admitting: Internal Medicine

## 2018-09-20 ENCOUNTER — Other Ambulatory Visit: Payer: Self-pay | Admitting: Internal Medicine

## 2018-09-25 ENCOUNTER — Other Ambulatory Visit (INDEPENDENT_AMBULATORY_CARE_PROVIDER_SITE_OTHER): Payer: Self-pay | Admitting: Family Medicine

## 2018-09-25 DIAGNOSIS — E119 Type 2 diabetes mellitus without complications: Secondary | ICD-10-CM

## 2018-09-27 ENCOUNTER — Ambulatory Visit (INDEPENDENT_AMBULATORY_CARE_PROVIDER_SITE_OTHER): Payer: Self-pay | Admitting: Family Medicine

## 2018-09-29 ENCOUNTER — Ambulatory Visit (INDEPENDENT_AMBULATORY_CARE_PROVIDER_SITE_OTHER): Payer: Medicare Other | Admitting: Bariatrics

## 2018-09-29 ENCOUNTER — Other Ambulatory Visit: Payer: Self-pay

## 2018-09-29 ENCOUNTER — Encounter (INDEPENDENT_AMBULATORY_CARE_PROVIDER_SITE_OTHER): Payer: Self-pay | Admitting: Bariatrics

## 2018-09-29 DIAGNOSIS — E559 Vitamin D deficiency, unspecified: Secondary | ICD-10-CM | POA: Diagnosis not present

## 2018-09-29 DIAGNOSIS — E119 Type 2 diabetes mellitus without complications: Secondary | ICD-10-CM | POA: Diagnosis not present

## 2018-09-29 DIAGNOSIS — Z6831 Body mass index (BMI) 31.0-31.9, adult: Secondary | ICD-10-CM

## 2018-09-29 DIAGNOSIS — E669 Obesity, unspecified: Secondary | ICD-10-CM | POA: Diagnosis not present

## 2018-09-29 MED ORDER — METFORMIN HCL 500 MG PO TABS: ORAL_TABLET | ORAL | 0 refills | Status: DC

## 2018-09-30 ENCOUNTER — Other Ambulatory Visit (INDEPENDENT_AMBULATORY_CARE_PROVIDER_SITE_OTHER): Payer: Self-pay | Admitting: Family Medicine

## 2018-09-30 DIAGNOSIS — E559 Vitamin D deficiency, unspecified: Secondary | ICD-10-CM

## 2018-10-02 ENCOUNTER — Other Ambulatory Visit (INDEPENDENT_AMBULATORY_CARE_PROVIDER_SITE_OTHER): Payer: Self-pay | Admitting: Family Medicine

## 2018-10-02 DIAGNOSIS — E119 Type 2 diabetes mellitus without complications: Secondary | ICD-10-CM

## 2018-10-03 ENCOUNTER — Other Ambulatory Visit (INDEPENDENT_AMBULATORY_CARE_PROVIDER_SITE_OTHER): Payer: Self-pay | Admitting: Family Medicine

## 2018-10-03 DIAGNOSIS — E119 Type 2 diabetes mellitus without complications: Secondary | ICD-10-CM

## 2018-10-04 NOTE — Progress Notes (Signed)
Office: [phone]  /  Fax: [phone] TeleHealth Visit:  Erin Roach has verbally consented to this TeleHealth visit today. The patient is located at home, the provider is located at the [facility] office. The participants in this visit include the listed provider and patient and any and all parties involved. The visit was conducted today via FaceTime.  HPI:   Chief Complaint: OBESITY Erin Roach is here to discuss her progress with her obesity treatment plan. She is on the keep a food journal with 1500 calories and 80 grams of protein daily and is following her eating plan approximately 50 % of the time. She states she is walking 25 minutes 6 times per week, riding the stationary bike for 17 minutes 6 times and gardening for 60 minutes 6 times per week. Khiara states that she has either maintained or lost weight. She states that she has not weighed herself. We were unable to weigh the patient today for this TeleHealth visit. She feels as if she has maintained or lost weight since her last visit.   Diabetes II Erin Roach has a diagnosis of diabetes type II. Erin Roach states fasting BGs range between 110 and 120 and she denies any hypoglycemic episodes. Last A1c was at 46.7 She has been working on intensive lifestyle modifications including diet, exercise, and weight loss to help control her blood glucose levels. Her appetite is normal.  Vitamin D deficiency Erin Roach has a diagnosis of vitamin D deficiency. She is currently taking vit D and denies nausea, vomiting or muscle weakness.  ASSESSMENT AND PLAN:  Type 2 diabetes mellitus without complication, without long-term current use of insulin ([place]) - Plan: metFORMIN (GLUCOPHAGE) 500 MG tablet  Vitamin D deficiency  Class 1 obesity with serious comorbidity and body mass index (BMI) of 31.0 to 31.9 in adult, unspecified obesity type  PLAN:  Diabetes II Erin Roach has been given extensive diabetes education by myself today including  ideal fasting and post-prandial blood glucose readings, individual ideal Hgb A1c goals and hypoglycemia prevention. We discussed the importance of good blood sugar control to decrease the likelihood of diabetic complications such as nephropathy, neuropathy, limb loss, blindness, coronary artery disease, and death. We discussed the importance of intensive lifestyle modification including diet, exercise and weight loss as the first line treatment for diabetes. Erin Roach agrees to continue Metformin 500 mg BID #60 with no refills and follow up at the agreed upon time.  Vitamin D Deficiency Erin Roach was informed that low vitamin D levels contributes to fatigue and are associated with obesity, breast, and colon cancer. She agrees to continue to take prescription Vit D '@50' ,000 IU every week and will follow up for routine testing of vitamin D, at least 2-3 times per year. She was informed of the risk of over-replacement of vitamin D and agrees to not increase her dose unless she discusses this with [name] first.  Obesity Erin Roach is currently in the action stage of change. As such, her goal is to continue with weight loss efforts She has agreed to keep a food journal with 1500 calories and 80 grams of protein daily Erin Roach will continue activities per week for weight loss and overall health benefits. We discussed the following Behavioral Modification Strategies today: increase H2O intake, no skipping meals, keeping healthy foods in the home, increasing lean protein intake, decreasing simple carbohydrates, increasing vegetables, decrease eating out and work on meal planning and easy cooking plans Erin Roach will weigh herself at home until she resumes visits in-house.  Erin Roach has agreed to follow up with our clinic in 2 weeks. She was informed of the importance of frequent follow up visits to maximize her success with intensive lifestyle modifications for her multiple health conditions.  ALLERGIES: No Known  Allergies  MEDICATIONS: Current Outpatient Medications on File Prior to Visit  Medication Sig Dispense Refill  . albuterol (PROAIR HFA) 108 (90 Base) MCG/ACT inhaler Inhale 2 puffs into the lungs every 6 (six) hours as needed for wheezing or shortness of breath. 1 Inhaler 5  . Blood Glucose Monitoring Suppl (ONE TOUCH ULTRA 2) w/Device KIT USE TO TEST BLOOD SUGARS UP TO 4 TIMES DAILY 1 each 0  . budesonide-formoterol (SYMBICORT) 160-4.5 MCG/ACT inhaler Inhale 2 puffs into the lungs daily. 1 Inhaler 5  . Cholecalciferol (VITAMIN D) 2000 units tablet Take 1 tablet (2,000 Units total) by mouth daily.    . fluticasone (FLONASE) 50 MCG/ACT nasal spray Place 1 spray into both nostrils daily. 16 g 0  . furosemide (LASIX) 20 MG tablet TAKE ONE TABLET BY MOUTH DAILY 90 tablet 1  . glucose blood (ACCU-CHEK AVIVA) test strip Testing twice daily 100 each 0  . HORIZANT 600 MG TBCR     . HYDROcodone-acetaminophen (NORCO/VICODIN) 5-325 MG tablet Take 1 tablet by mouth at bedtime. 30 tablet 0  . Insulin Pen Needle (BD PEN NEEDLE NANO 2ND GEN) 32G X 4 MM MISC 1 Package by Does not apply route 2 (two) times daily. 100 each 0  . liraglutide (VICTOZA) 18 MG/3ML SOPN DIAL AND INJECT SUBCUTANEOUSLY 1.8MG DAILY EVERY MORNING 3 pen 0  . losartan (COZAAR) 100 MG tablet Take 1 tablet (100 mg total) by mouth daily. -- Office visit needed for further refills 90 tablet 0  . pramipexole (MIRAPEX) 0.25 MG tablet Take 0.5 mg by mouth 2 (two) times daily. 1630  And  Bedtime    . pregabalin (LYRICA) 75 MG capsule TAKE ONE CAPSULE BY MOUTH EVERY AFTERNOON AND TAKE TWO CAPSULES BY MOUTH EVERY NIGHT AT BEDTIME 270 capsule 0  . simvastatin (ZOCOR) 40 MG tablet Take 1 tablet (40 mg total) by mouth at bedtime. -- Office visit needed for further refills 90 tablet 0  . Vitamin D, Ergocalciferol, (DRISDOL) 1.25 MG (50000 UT) CAPS capsule Take 1 capsule (50,000 Units total) by mouth every 7 (seven) days. 4 capsule 0  . zolpidem (AMBIEN) 5  MG tablet Take 1/2 tab po QHS PRN 10 tablet 0  . [DISCONTINUED] VESICARE 10 MG tablet TAKE 1 TABLET EVERY DAY 30 tablet 0   No current facility-administered medications on file prior to visit.     PAST MEDICAL HISTORY: Past Medical History:  Diagnosis Date  . Allergic rhinitis   . Apnea   . Arthritis   . Asthma    very mild  . Back pain   . Bilateral lower extremity edema   . Carotid artery disease ([place])    a. Carotid duplex 03/2016 - duplex was stable, 82-50% RICA, 5-39% LICA  . Complication of anesthesia    post-op delirium  . Endometrial polyp   . Essential hypertension   . GERD (gastroesophageal reflux disease)   . Heart murmur    per pt  . History of adenomatous polyp of colon    04-24-2016  tubular adenoma  . History of squamous cell carcinoma excision    pilonidal area second excision 02-02-2002  . HTN (hypertension)   . Hyperlipidemia   . Leg pain   . OAB (overactive bladder)   .  RA (rheumatoid arthritis) ([place])   . RLS (restless legs syndrome)   . Shortness of breath on exertion   . SUI (stress urinary incontinence, female)   . Venous insufficiency    legs  . Vitamin D deficiency     PAST SURGICAL HISTORY: Past Surgical History:  Procedure Laterality Date  . APPENDECTOMY  07-02-2005   [name]   open  . CARDIAC CATHETERIZATION  01-23-2009  dr Darnell Level brodie   normal coronary arteries and lvf  . CARDIOVASCULAR STRESS TEST  03/31/2016   Low risk nuclear study w/ medium defect of mild severity in basal anterior and mid anterior location w/ no evidence ischemia or infarction/  normal LV function and wall motion,  nuclear stress ef 71%  . CATARACT EXTRACTION W/ INTRAOCULAR LENS IMPLANT Left 09/2016  . COLONOSCOPY  last one 04-24-2016  . HYSTEROSCOPY W/D&C N/A 12/22/2016   Procedure: DILATATION AND CURETTAGE /HYSTEROSCOPY;  Surgeon: Dian Queen, MD;  [facility];  Service: Gynecology;  Laterality: N/A;  . IR US GUIDANCE  09/08/2016   . POSTERIOR LUMBAR FUSION  02/ 2018    [facility] ([place], [place])  . SKIN CANCER EXCISION    . TONSILLECTOMY  child  . 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 TRANSTHORACIC ECHOCARDIOGRAM  01/21/2009   mild LVH, ef 35-46%, grade 1 diastolic dysfunction  . ULNAR NERVE TRANSPOSITION Left 12-20-2008  dr sypher   decompression and partial resection medial triceps fascia  . WIDE LOCAL EXCISION PILONIDAL AREA  02-02-2002  [name]   recurrent squamous cell carcinoma in situ    SOCIAL HISTORY: Social History   Tobacco Use  . Smoking status: Former Smoker    Packs/day: 0.50    Years: 10.00    Pack years: 5.00    Types: Cigarettes    Last attempt to quit: 12/10/1973    Years since quitting: 44.8  . Smokeless tobacco: Never Used  Substance Use Topics  . Alcohol use: Yes    Comment: occasional  . Drug use: No    FAMILY HISTORY: Family History  Problem Relation Age of Onset  . Esophageal cancer Father   . Heart disease Father        Unknown what kind  . Hyperlipidemia Father   . Hypertension Father   . Cancer Father   . Heart failure Father   . Hyperlipidemia Mother   . Hypertension Mother   . CAD Sister        71 years younger than patient - had stent. Was told that her anorexia/bulimia may have played a role  . Stroke Maternal Grandmother 66  . Diabetes Maternal Grandmother   . Heart attack Maternal Grandfather 93  . COPD Maternal Grandfather   . Heart attack Paternal Grandmother        27s  . Diabetes Unknown        MGGM    ROS: Review of Systems  Gastrointestinal: Negative for nausea and vomiting.  Musculoskeletal:       Negative for muscle weakness  Endo/Heme/Allergies:       Negative for polyphagia    PHYSICAL EXAM: Pt in no acute distress  RECENT LABS AND TESTS: BMET    Component Value Date/Time   NA 140  04/20/2018 1130   K 4.5 04/20/2018 1130   CL 101 04/20/2018 1130   CO2 22 04/20/2018  1130   GLUCOSE 97 04/20/2018 1130   GLUCOSE 147 (H) 12/08/2016 1436   BUN 15 04/20/2018 1130   CREATININE 0.74 04/20/2018 1130   CALCIUM 9.0 04/20/2018 1130   GFRNONAA 80 04/20/2018 1130   GFRAA 92 04/20/2018 1130   Lab Results  Component Value Date   HGBA1C 5.7 (H) 04/20/2018   HGBA1C 5.3 01/06/2018   HGBA1C 5.4 09/28/2017   HGBA1C 5.5 06/09/2017   HGBA1C 8.2 (H) 02/23/2017   Lab Results  Component Value Date   INSULIN 27.9 (H) 04/20/2018   INSULIN 24.6 01/06/2018   INSULIN 17.0 09/28/2017   INSULIN 28.3 (H) 06/09/2017   INSULIN 30.7 (H) 02/23/2017   CBC    Component Value Date/Time   WBC 6.2 09/28/2017 0906   WBC 6.8 12/15/2016 1159   RBC 4.07 09/28/2017 0906   RBC 4.23 12/15/2016 1159   HGB 11.8 09/28/2017 0906   HCT 35.4 09/28/2017 0906   PLT 242 12/15/2016 1159   MCV 87 09/28/2017 0906   MCH 29.0 09/28/2017 0906   MCH 30.3 12/15/2016 1159   MCHC 33.3 09/28/2017 0906   MCHC 34.8 12/15/2016 1159   RDW 14.3 09/28/2017 0906   LYMPHSABS 1.5 09/28/2017 0906   MONOABS 0.5 06/04/2016 0808   EOSABS 0.2 09/28/2017 0906   BASOSABS 0.0 09/28/2017 0906   Iron/TIBC/Ferritin/ %Sat    Component Value Date/Time   IRON 109 03/02/2017 1035   TIBC 333 03/19/2015 1448   FERRITIN 218.0 03/02/2017 1035   IRONPCTSAT 15 03/19/2015 1448   Lipid Panel     Component Value Date/Time   CHOL 226 (H) 04/20/2018 1130   TRIG 216 (H) 04/20/2018 1130   TRIG 101 05/19/2006 0832   HDL 54 04/20/2018 1130   CHOLHDL 4 06/04/2016 0808   VLDL 31.4 06/04/2016 0808   LDLCALC 129 (H) 04/20/2018 1130   LDLDIRECT 141.3 09/06/2012 0907   Hepatic Function Panel     Component Value Date/Time   PROT 6.5 04/20/2018 1130   ALBUMIN 4.2 04/20/2018 1130   AST 16 04/20/2018 1130   ALT 20 04/20/2018 1130   ALKPHOS 88 04/20/2018 1130   BILITOT 0.3 04/20/2018 1130   BILIDIR 0.1 12/04/2013 1724      Component  Value Date/Time   TSH 4.050 09/28/2017 0906   TSH 4.810 (H) 02/23/2017 1007   TSH 4.22 06/04/2016 0808     Ref. Range 04/20/2018 11:30  Vitamin D, 25-Hydroxy Latest Ref Range: 30.0 - 100.0 ng/mL 46.7    I, Doreene Nest, am acting as Location manager for [employer]. Owens Shark, DO  I have reviewed the above documentation for accuracy and completeness, and I agree with the above. -Jearld Lesch, DO

## 2018-10-11 ENCOUNTER — Encounter: Payer: Self-pay | Admitting: Internal Medicine

## 2018-10-11 MED ORDER — HYDROCODONE-ACETAMINOPHEN 5-325 MG PO TABS: 1.0000 | ORAL_TABLET | Freq: Every day | ORAL | 0 refills | Status: DC

## 2018-10-19 ENCOUNTER — Other Ambulatory Visit: Payer: Self-pay

## 2018-10-19 ENCOUNTER — Ambulatory Visit (INDEPENDENT_AMBULATORY_CARE_PROVIDER_SITE_OTHER): Payer: Medicare Other | Admitting: Family Medicine

## 2018-10-19 ENCOUNTER — Encounter (INDEPENDENT_AMBULATORY_CARE_PROVIDER_SITE_OTHER): Payer: Self-pay | Admitting: Family Medicine

## 2018-10-19 DIAGNOSIS — Z6831 Body mass index (BMI) 31.0-31.9, adult: Secondary | ICD-10-CM

## 2018-10-19 DIAGNOSIS — R7303 Prediabetes: Secondary | ICD-10-CM

## 2018-10-19 DIAGNOSIS — E669 Obesity, unspecified: Secondary | ICD-10-CM | POA: Diagnosis not present

## 2018-10-21 ENCOUNTER — Other Ambulatory Visit (INDEPENDENT_AMBULATORY_CARE_PROVIDER_SITE_OTHER): Payer: Self-pay | Admitting: Bariatrics

## 2018-10-21 DIAGNOSIS — E119 Type 2 diabetes mellitus without complications: Secondary | ICD-10-CM

## 2018-10-24 ENCOUNTER — Other Ambulatory Visit (INDEPENDENT_AMBULATORY_CARE_PROVIDER_SITE_OTHER): Payer: Self-pay

## 2018-10-24 DIAGNOSIS — E119 Type 2 diabetes mellitus without complications: Secondary | ICD-10-CM

## 2018-10-24 DIAGNOSIS — E559 Vitamin D deficiency, unspecified: Secondary | ICD-10-CM

## 2018-10-24 DIAGNOSIS — J3089 Other allergic rhinitis: Secondary | ICD-10-CM

## 2018-10-24 MED ORDER — VICTOZA 18 MG/3ML ~~LOC~~ SOPN: PEN_INJECTOR | SUBCUTANEOUS | 0 refills | Status: DC

## 2018-10-24 MED ORDER — FLUTICASONE PROPIONATE 50 MCG/ACT NA SUSP: 1.0000 | Freq: Every day | NASAL | 0 refills | Status: DC

## 2018-10-24 MED ORDER — METFORMIN HCL 500 MG PO TABS: ORAL_TABLET | ORAL | 0 refills | Status: DC

## 2018-10-24 MED ORDER — LOSARTAN POTASSIUM 100 MG PO TABS: 100.0000 mg | ORAL_TABLET | Freq: Every day | ORAL | 0 refills | Status: DC

## 2018-10-24 MED ORDER — VITAMIN D (ERGOCALCIFEROL) 1.25 MG (50000 UNIT) PO CAPS: 50000.0000 [IU] | ORAL_CAPSULE | ORAL | 0 refills | Status: DC

## 2018-10-24 NOTE — Progress Notes (Signed)
Office: [phone]  /  Fax: [phone] TeleHealth Visit:  MYSTIC LABO has verbally consented to this TeleHealth visit today. The patient is located at home, the provider is located at the [employer] [facility] office. The participants in this visit include the listed provider and patient. The visit was conducted today via Face Time.  HPI:   Chief Complaint: OBESITY Erin Roach is here to discuss her progress with her obesity treatment plan. She is keeping a food journal with 1500 calories and 80 grams of protein and is following her eating plan approximately 40 % of the time. She states she is walking, riding a bike, and gardening 20 minutes 5 times per week. Abbe states that she is doing well maintaining her weight. She hasn't been diligent about journaling, but is still mindful. Erin Roach is happy to maintain her weight for now and is not prepared to do anything more formal.  We were unable to weigh the patient today for this TeleHealth visit. She feels as if she has maintained weight since her last visit. She has lost 27 lbs since starting treatment with [name].  Pre-Diabetes Erin Roach has a diagnosis of pre-diabetes based on her elevated Hgb A1c and was informed this puts her at greater risk of developing diabetes. She is stable on metformin and is mindful of her eating, but is not following a plan closely. Her last A1c was 5.7 on 04/20/18. She continues to work on diet and exercise to decrease risk of diabetes. She denies nausea, vomiting, or hypoglycemia.   ASSESSMENT AND PLAN:  Prediabetes  Class 1 obesity with serious comorbidity and body mass index (BMI) of 31.0 to 31.9 in adult, unspecified obesity type  PLAN:  Pre-Diabetes Erin Roach will continue to work on weight loss, exercise, and decreasing simple carbohydrates in her diet to help decrease the risk of diabetes. She was informed that eating too many simple carbohydrates or too many calories at one sitting increases the likelihood of  GI side effects. Erin Roach agreed to continue her diet, exercise, and metformin. We will check labs when it is safe for her to come back into the office. Erin Roach agreed to follow up with [name] as directed to monitor her progress in 2 to 3 weeks.   I spent > than 50% of the 15 minute visit on counseling as documented in the note.  Obesity Erin Roach is currently in the action stage of change. As such, her goal is to continue with weight loss efforts. She has agreed to continue to keep a food journal with 1500 calories and 80+ grams of protein. Erin Roach agrees to continue to be mindful and we will continue to monitor her progress. Erin Roach has been instructed to work up to a goal of 150 minutes of combined cardio and strengthening exercise per week for weight loss and overall health benefits. We discussed the following Behavioral Modification Strategies today: increasing lean protein intake and decreasing simple carbohydrates.   Erin Roach has agreed to follow up with our clinic in 2 to 3 weeks. She was informed of the importance of frequent follow up visits to maximize her success with intensive lifestyle modifications for her multiple health conditions.  ALLERGIES: No Known Allergies  MEDICATIONS: Current Outpatient Medications on File Prior to Visit  Medication Sig Dispense Refill  . albuterol (PROAIR HFA) 108 (90 Base) MCG/ACT inhaler Inhale 2 puffs into the lungs every 6 (six) hours as needed for wheezing or shortness of breath. 1 Inhaler 5  . Blood Glucose Monitoring Suppl (ONE  TOUCH ULTRA 2) w/Device KIT USE TO TEST BLOOD SUGARS UP TO 4 TIMES DAILY 1 each 0  . budesonide-formoterol (SYMBICORT) 160-4.5 MCG/ACT inhaler Inhale 2 puffs into the lungs daily. 1 Inhaler 5  . Cholecalciferol (VITAMIN D) 2000 units tablet Take 1 tablet (2,000 Units total) by mouth daily.    . fluticasone (FLONASE) 50 MCG/ACT nasal spray Place 1 spray into both nostrils daily. 16 g 0  . furosemide (LASIX) 20 MG tablet TAKE ONE TABLET BY  MOUTH DAILY 90 tablet 1  . glucose blood (ACCU-CHEK AVIVA) test strip Testing twice daily 100 each 0  . HORIZANT 600 MG TBCR     . HYDROcodone-acetaminophen (NORCO/VICODIN) 5-325 MG tablet Take 1 tablet by mouth at bedtime. 30 tablet 0  . Insulin Pen Needle (BD PEN NEEDLE NANO 2ND GEN) 32G X 4 MM MISC 1 Package by Does not apply route 2 (two) times daily. 100 each 0  . liraglutide (VICTOZA) 18 MG/3ML SOPN DIAL AND INJECT SUBCUTANEOUSLY 1.8MG DAILY EVERY MORNING 3 pen 0  . losartan (COZAAR) 100 MG tablet Take 1 tablet (100 mg total) by mouth daily. -- Office visit needed for further refills 90 tablet 0  . metFORMIN (GLUCOPHAGE) 500 MG tablet TAKE 1 TABLET BY MOUTH TWICE A DAY WITH A MEAL. 60 tablet 0  . pramipexole (MIRAPEX) 0.25 MG tablet Take 0.5 mg by mouth 2 (two) times daily. 1630  And  Bedtime    . pregabalin (LYRICA) 75 MG capsule TAKE ONE CAPSULE BY MOUTH EVERY AFTERNOON AND TAKE TWO CAPSULES BY MOUTH EVERY NIGHT AT BEDTIME 270 capsule 0  . simvastatin (ZOCOR) 40 MG tablet Take 1 tablet (40 mg total) by mouth at bedtime. -- Office visit needed for further refills 90 tablet 0  . Vitamin D, Ergocalciferol, (DRISDOL) 1.25 MG (50000 UT) CAPS capsule Take 1 capsule (50,000 Units total) by mouth every 7 (seven) days. 4 capsule 0  . zolpidem (AMBIEN) 5 MG tablet Take 1/2 tab po QHS PRN 10 tablet 0  . [DISCONTINUED] VESICARE 10 MG tablet TAKE 1 TABLET EVERY DAY 30 tablet 0   No current facility-administered medications on file prior to visit.     PAST MEDICAL HISTORY: Past Medical History:  Diagnosis Date  . Allergic rhinitis   . Apnea   . Arthritis   . Asthma    very mild  . Back pain   . Bilateral lower extremity edema   . Carotid artery disease ([place])    a. Carotid duplex 03/2016 - duplex was stable, 20-35% RICA, 5-97% LICA  . Complication of anesthesia    post-op delirium  . Endometrial polyp   . Essential hypertension   . GERD (gastroesophageal reflux disease)   . Heart murmur     per pt  . History of adenomatous polyp of colon    04-24-2016  tubular adenoma  . History of squamous cell carcinoma excision    pilonidal area second excision 02-02-2002  . HTN (hypertension)   . Hyperlipidemia   . Leg pain   . OAB (overactive bladder)   . RA (rheumatoid arthritis) ([place])   . RLS (restless legs syndrome)   . Shortness of breath on exertion   . SUI (stress urinary incontinence, female)   . Venous insufficiency    legs  . Vitamin D deficiency     PAST SURGICAL HISTORY: Past Surgical History:  Procedure Laterality Date  . APPENDECTOMY  07-02-2005   [name]   open  . CARDIAC CATHETERIZATION  01-23-2009  dr Darnell Level brodie   normal coronary arteries and lvf  . CARDIOVASCULAR STRESS TEST  03/31/2016   Low risk nuclear study w/ medium defect of mild severity in basal anterior and mid anterior location w/ no evidence ischemia or infarction/  normal LV function and wall motion,  nuclear stress ef 71%  . CATARACT EXTRACTION W/ INTRAOCULAR LENS IMPLANT Left 09/2016  . COLONOSCOPY  last one 04-24-2016  . HYSTEROSCOPY W/D&C N/A 12/22/2016   Procedure: DILATATION AND CURETTAGE /HYSTEROSCOPY;  Surgeon: Dian Queen, MD;  [facility];  Service: Gynecology;  Laterality: N/A;  . IR US GUIDANCE  09/08/2016  . POSTERIOR LUMBAR FUSION  02/ 2018    [facility] ([place], [place])  . SKIN CANCER EXCISION    . TONSILLECTOMY  child  . 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 TRANSTHORACIC ECHOCARDIOGRAM  01/21/2009   mild LVH, ef 22-02%, grade 1 diastolic dysfunction  . ULNAR NERVE TRANSPOSITION Left 12-20-2008  dr sypher   decompression and partial resection medial triceps fascia  . WIDE LOCAL EXCISION PILONIDAL AREA  02-02-2002  [name]   recurrent squamous cell carcinoma in situ    SOCIAL HISTORY: Social  History   Tobacco Use  . Smoking status: Former Smoker    Packs/day: 0.50    Years: 10.00    Pack years: 5.00    Types: Cigarettes    Quit date: 12/10/1973    Years since quitting: 44.9  . Smokeless tobacco: Never Used  Substance Use Topics  . Alcohol use: Yes    Comment: occasional  . Drug use: No    FAMILY HISTORY: Family History  Problem Relation Age of Onset  . Esophageal cancer Father   . Heart disease Father        Unknown what kind  . Hyperlipidemia Father   . Hypertension Father   . Cancer Father   . Heart failure Father   . Hyperlipidemia Mother   . Hypertension Mother   . CAD Sister        38 years younger than patient - had stent. Was told that her anorexia/bulimia may have played a role  . Stroke Maternal Grandmother 66  . Diabetes Maternal Grandmother   . Heart attack Maternal Grandfather 93  . COPD Maternal Grandfather   . Heart attack Paternal Grandmother        6s  . Diabetes Unknown        MGGM    ROS: Review of Systems  Gastrointestinal: Negative for nausea and vomiting.  Endo/Heme/Allergies:       Negative for hypoglycemia.    PHYSICAL EXAM: Pt in no acute distress  RECENT LABS AND TESTS: BMET    Component Value Date/Time   NA 140 04/20/2018 1130   K 4.5 04/20/2018 1130   CL 101 04/20/2018 1130   CO2 22 04/20/2018 1130   GLUCOSE 97 04/20/2018 1130   GLUCOSE 147 (H) 12/08/2016 1436   BUN 15 04/20/2018 1130   CREATININE 0.74 04/20/2018 1130   CALCIUM 9.0 04/20/2018 1130   GFRNONAA 80 04/20/2018 1130   GFRAA 92 04/20/2018 1130   Lab Results  Component Value Date   HGBA1C 5.7 (H) 04/20/2018   HGBA1C 5.3 01/06/2018   HGBA1C 5.4 09/28/2017   HGBA1C 5.5 06/09/2017   HGBA1C 8.2 (H) 02/23/2017   Lab Results  Component Value Date  INSULIN 27.9 (H) 04/20/2018   INSULIN 24.6 01/06/2018   INSULIN 17.0 09/28/2017   INSULIN 28.3 (H) 06/09/2017   INSULIN 30.7 (H) 02/23/2017   CBC    Component Value Date/Time   WBC 6.2 09/28/2017  0906   WBC 6.8 12/15/2016 1159   RBC 4.07 09/28/2017 0906   RBC 4.23 12/15/2016 1159   HGB 11.8 09/28/2017 0906   HCT 35.4 09/28/2017 0906   PLT 242 12/15/2016 1159   MCV 87 09/28/2017 0906   MCH 29.0 09/28/2017 0906   MCH 30.3 12/15/2016 1159   MCHC 33.3 09/28/2017 0906   MCHC 34.8 12/15/2016 1159   RDW 14.3 09/28/2017 0906   LYMPHSABS 1.5 09/28/2017 0906   MONOABS 0.5 06/04/2016 0808   EOSABS 0.2 09/28/2017 0906   BASOSABS 0.0 09/28/2017 0906   Iron/TIBC/Ferritin/ %Sat    Component Value Date/Time   IRON 109 03/02/2017 1035   TIBC 333 03/19/2015 1448   FERRITIN 218.0 03/02/2017 1035   IRONPCTSAT 15 03/19/2015 1448   Lipid Panel     Component Value Date/Time   CHOL 226 (H) 04/20/2018 1130   TRIG 216 (H) 04/20/2018 1130   TRIG 101 05/19/2006 0832   HDL 54 04/20/2018 1130   CHOLHDL 4 06/04/2016 0808   VLDL 31.4 06/04/2016 0808   LDLCALC 129 (H) 04/20/2018 1130   LDLDIRECT 141.3 09/06/2012 0907   Hepatic Function Panel     Component Value Date/Time   PROT 6.5 04/20/2018 1130   ALBUMIN 4.2 04/20/2018 1130   AST 16 04/20/2018 1130   ALT 20 04/20/2018 1130   ALKPHOS 88 04/20/2018 1130   BILITOT 0.3 04/20/2018 1130   BILIDIR 0.1 12/04/2013 1724      Component Value Date/Time   TSH 4.050 09/28/2017 0906   TSH 4.810 (H) 02/23/2017 1007   TSH 4.22 06/04/2016 0808   Results for CHANISE, HABECK (MRN [id number]) as of 10/24/2018 12:56  Ref. Range 04/20/2018 11:30  Vitamin D, 25-Hydroxy Latest Ref Range: 30.0 - 100.0 ng/mL 46.7     I, Marcille Blanco, CMA, am acting as transcriptionist for Starlyn Skeans, MD I have reviewed the above documentation for accuracy and completeness, and I agree with the above. -Dennard Nip, MD

## 2018-11-08 ENCOUNTER — Encounter: Payer: Self-pay | Admitting: Internal Medicine

## 2018-11-09 MED ORDER — HYDROCODONE-ACETAMINOPHEN 5-325 MG PO TABS: 1.0000 | ORAL_TABLET | Freq: Every day | ORAL | 0 refills | Status: DC

## 2018-11-14 ENCOUNTER — Other Ambulatory Visit (INDEPENDENT_AMBULATORY_CARE_PROVIDER_SITE_OTHER): Payer: Self-pay | Admitting: Family Medicine

## 2018-11-14 DIAGNOSIS — E119 Type 2 diabetes mellitus without complications: Secondary | ICD-10-CM

## 2018-11-14 DIAGNOSIS — E559 Vitamin D deficiency, unspecified: Secondary | ICD-10-CM

## 2018-11-16 ENCOUNTER — Telehealth (INDEPENDENT_AMBULATORY_CARE_PROVIDER_SITE_OTHER): Payer: Medicare Other | Admitting: Family Medicine

## 2018-11-16 ENCOUNTER — Encounter (INDEPENDENT_AMBULATORY_CARE_PROVIDER_SITE_OTHER): Payer: Self-pay | Admitting: Family Medicine

## 2018-11-16 ENCOUNTER — Other Ambulatory Visit (INDEPENDENT_AMBULATORY_CARE_PROVIDER_SITE_OTHER): Payer: Self-pay | Admitting: Family Medicine

## 2018-11-16 ENCOUNTER — Other Ambulatory Visit: Payer: Self-pay

## 2018-11-16 DIAGNOSIS — E119 Type 2 diabetes mellitus without complications: Secondary | ICD-10-CM | POA: Diagnosis not present

## 2018-11-16 DIAGNOSIS — I10 Essential (primary) hypertension: Secondary | ICD-10-CM | POA: Diagnosis not present

## 2018-11-16 DIAGNOSIS — J3089 Other allergic rhinitis: Secondary | ICD-10-CM

## 2018-11-16 DIAGNOSIS — E669 Obesity, unspecified: Secondary | ICD-10-CM

## 2018-11-16 DIAGNOSIS — Z6831 Body mass index (BMI) 31.0-31.9, adult: Secondary | ICD-10-CM

## 2018-11-16 MED ORDER — BD PEN NEEDLE NANO 2ND GEN 32G X 4 MM MISC: 1.0000 | Freq: Two times a day (BID) | 0 refills | Status: DC

## 2018-11-16 MED ORDER — LOSARTAN POTASSIUM 100 MG PO TABS: 100.0000 mg | ORAL_TABLET | Freq: Every day | ORAL | 0 refills | Status: DC

## 2018-11-16 MED ORDER — METFORMIN HCL 500 MG PO TABS: ORAL_TABLET | ORAL | 0 refills | Status: DC

## 2018-11-16 MED ORDER — FLUTICASONE PROPIONATE 50 MCG/ACT NA SUSP: 1.0000 | Freq: Every day | NASAL | 0 refills | Status: DC

## 2018-11-16 MED ORDER — ACCU-CHEK AVIVA VI STRP: ORAL_STRIP | 0 refills | Status: DC

## 2018-11-16 MED ORDER — VICTOZA 18 MG/3ML ~~LOC~~ SOPN: PEN_INJECTOR | SUBCUTANEOUS | 0 refills | Status: DC

## 2018-11-17 NOTE — Progress Notes (Signed)
Office: [phone]  /  Fax: [phone] TeleHealth Visit:  Erin Roach has verbally consented to this TeleHealth visit today. The patient is located at home, the provider is located at the [facility] office. The participants in this visit include the listed provider and patient and any and all parties involved. The visit was conducted today via FaceTime.  HPI:   Chief Complaint: OBESITY Erin Roach is here to discuss her progress with her obesity treatment plan. She is on the keep a food journal with 1500 calories and 80+ grams of protein daily plan and is following her eating plan approximately 70 % of the time. She states she is doing yard work for exercise. Erin Roach continues to do well with maintaining her weight. She has decreased eating out and she has done better with controlling her portions. Erin Roach notes her appetite has decreased and she isn't meeting her protein goals. We were unable to weigh the patient today for this TeleHealth visit. She feels as if she has maintained weight since her last visit. She has lost 27 lbs since starting treatment with [name].  Hypertension Erin Roach is a 78 y.o. female with hypertension. Her blood pressure is stable on Losartan. Erin Roach denies chest pain, headache or dizziness. She is working weight loss to help control her blood pressure with the goal of decreasing her risk of heart attack and stroke. Erin Roach blood pressure is currently controlled.  Diabetes II Erin Roach has a diagnosis of diabetes type II. Erin Roach states fasting BGs average between 110 and 120 and 2 hour post prandial BGs range in the 150's approximately.  Erin Roach denies any hypoglycemic episodes, nausea or vomiting. She is due for labs soon. She has been working on intensive lifestyle modifications including diet, exercise, and weight loss to help control her blood glucose levels.  Allergic Rhinitis Erin Roach has been on Flonase, on and off for years. She notes a decrease  in taste, in the last month, but no anosmia. She denies fever or chills, coughing or sneezing. Erin Roach has had no sick contacts.  ASSESSMENT AND PLAN:  Type 2 diabetes mellitus without complication, without long-term current use of insulin (HCC) - Plan: glucose blood (ACCU-CHEK AVIVA) test strip, Insulin Pen Needle (BD PEN NEEDLE NANO 2ND GEN) 32G X 4 MM MISC, liraglutide (VICTOZA) 18 MG/3ML SOPN, metFORMIN (GLUCOPHAGE) 500 MG tablet  Seasonal allergic rhinitis due to other allergic trigger - Plan: fluticasone (FLONASE) 50 MCG/ACT nasal spray  Essential hypertension  Class 1 obesity with serious comorbidity and body mass index (BMI) of 31.0 to 31.9 in adult, unspecified obesity type  PLAN:  Hypertension We discussed sodium restriction, working on healthy weight loss, and a regular exercise program as the means to achieve improved blood pressure control. Erin Roach agreed with this plan and agreed to follow up as directed. We will continue to monitor her blood pressure as well as her progress with the above lifestyle modifications. We will check labs in 1 month. She has agreed to continue Losartan 100 mg daily #90 with no refills and will watch for signs of hypotension as she continues her lifestyle modifications.  Diabetes II Erin Roach has been given extensive diabetes education by myself today including ideal fasting and post-prandial blood glucose readings, individual ideal Hgb A1c goals and hypoglycemia prevention. We discussed the importance of good blood sugar control to decrease the likelihood of diabetic complications such as nephropathy, neuropathy, limb loss, blindness, coronary artery disease, and death. We discussed the importance of intensive  lifestyle modification including diet, exercise and weight loss as the first line treatment for diabetes. Erin Roach agrees to continue metformin 500 mg two times daily with a meal #60 with no refills and Victoza 1.8 mg every morning #3 pens with no refills, nano  needles #100, Accu-Chek Aviva test strips #100 with no refills and follow up at the agreed upon time.  Allergic Rhinitis Erin Roach agrees to continue Flonase 1 spray each nostril daily with no refills. She is okay to hold for 2 weeks, to see if she returns to a normal sense of taste. She was worried about watching for signs of COVID-19 and to continue masking and social distancing. Erin Roach agrees to follow up with our clinic in 4 weeks.  Obesity Erin Roach is currently in the action stage of change. As such, her goal is to continue with weight loss efforts She has agreed to keep a food journal with 1500 calories and 80+ grams of protein daily Erin Roach has been instructed to work up to a goal of 150 minutes of combined cardio and strengthening exercise per week for weight loss and overall health benefits. We discussed the following Behavioral Modification Strategies today: no skipping meals, increasing lean protein intake and work on meal planning and easy cooking plans  Erin Roach has agreed to follow up with our clinic in 4 weeks. She was informed of the importance of frequent follow up visits to maximize her success with intensive lifestyle modifications for her multiple health conditions.  ALLERGIES: No Known Allergies  MEDICATIONS: Current Outpatient Medications on File Prior to Visit  Medication Sig Dispense Refill  . albuterol (PROAIR HFA) 108 (90 Base) MCG/ACT inhaler Inhale 2 puffs into the lungs every 6 (six) hours as needed for wheezing or shortness of breath. 1 Inhaler 5  . Blood Glucose Monitoring Suppl (ONE TOUCH ULTRA 2) w/Device KIT USE TO TEST BLOOD SUGARS UP TO 4 TIMES DAILY 1 each 0  . budesonide-formoterol (SYMBICORT) 160-4.5 MCG/ACT inhaler Inhale 2 puffs into the lungs daily. 1 Inhaler 5  . Cholecalciferol (VITAMIN D) 2000 units tablet Take 1 tablet (2,000 Units total) by mouth daily.    . furosemide (LASIX) 20 MG tablet TAKE ONE TABLET BY MOUTH DAILY 90 tablet 1  . HORIZANT 600 MG TBCR      . HYDROcodone-acetaminophen (NORCO/VICODIN) 5-325 MG tablet Take 1 tablet by mouth at bedtime. 30 tablet 0  . pramipexole (MIRAPEX) 0.25 MG tablet Take 0.5 mg by mouth 2 (two) times daily. 1630  And  Bedtime    . pregabalin (LYRICA) 75 MG capsule TAKE ONE CAPSULE BY MOUTH EVERY AFTERNOON AND TAKE TWO CAPSULES BY MOUTH EVERY NIGHT AT BEDTIME 270 capsule 0  . simvastatin (ZOCOR) 40 MG tablet Take 1 tablet (40 mg total) by mouth at bedtime. -- Office visit needed for further refills 90 tablet 0  . Vitamin D, Ergocalciferol, (DRISDOL) 1.25 MG (50000 UT) CAPS capsule Take 1 capsule (50,000 Units total) by mouth every 7 (seven) days. 4 capsule 0  . zolpidem (AMBIEN) 5 MG tablet Take 1/2 tab po QHS PRN 10 tablet 0   No current facility-administered medications on file prior to visit.     PAST MEDICAL HISTORY: Past Medical History:  Diagnosis Date  . Allergic rhinitis   . Apnea   . Arthritis   . Asthma    very mild  . Back pain   . Bilateral lower extremity edema   . Carotid artery disease ([place])    a. Carotid duplex 03/2016 - duplex  was stable, 54-65% RICA, 0-35% LICA  . Complication of anesthesia    post-op delirium  . Endometrial polyp   . Essential hypertension   . GERD (gastroesophageal reflux disease)   . Heart murmur    per pt  . History of adenomatous polyp of colon    04-24-2016  tubular adenoma  . History of squamous cell carcinoma excision    pilonidal area second excision 02-02-2002  . HTN (hypertension)   . Hyperlipidemia   . Leg pain   . OAB (overactive bladder)   . RA (rheumatoid arthritis) ([place])   . RLS (restless legs syndrome)   . Shortness of breath on exertion   . SUI (stress urinary incontinence, female)   . Venous insufficiency    legs  . Vitamin D deficiency     PAST SURGICAL HISTORY: Past Surgical History:  Procedure Laterality Date  . APPENDECTOMY  07-02-2005   [name]   open  . CARDIAC CATHETERIZATION  01-23-2009  dr Darnell Level brodie   normal  coronary arteries and lvf  . CARDIOVASCULAR STRESS TEST  03/31/2016   Low risk nuclear study w/ medium defect of mild severity in basal anterior and mid anterior location w/ no evidence ischemia or infarction/  normal LV function and wall motion,  nuclear stress ef 71%  . CATARACT EXTRACTION W/ INTRAOCULAR LENS IMPLANT Left 09/2016  . COLONOSCOPY  last one 04-24-2016  . HYSTEROSCOPY W/D&C N/A 12/22/2016   Procedure: DILATATION AND CURETTAGE /HYSTEROSCOPY;  Surgeon: Dian Queen, MD;  [facility];  Service: Gynecology;  Laterality: N/A;  . IR US GUIDANCE  09/08/2016  . POSTERIOR LUMBAR FUSION  02/ 2018    [facility] ([place])  . SKIN CANCER EXCISION    . TONSILLECTOMY  child  . 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 TRANSTHORACIC ECHOCARDIOGRAM  01/21/2009   mild LVH, ef 46-56%, grade 1 diastolic dysfunction  . ULNAR NERVE TRANSPOSITION Left 12-20-2008  dr sypher   decompression and partial resection medial triceps fascia  . WIDE LOCAL EXCISION PILONIDAL AREA  02-02-2002  [name]   recurrent squamous cell carcinoma in situ    SOCIAL HISTORY: Social History   Tobacco Use  . Smoking status: Former Smoker    Packs/day: 0.50    Years: 10.00    Pack years: 5.00    Types: Cigarettes    Quit date: 12/10/1973    Years since quitting: 44.9  . Smokeless tobacco: Never Used  Substance Use Topics  . Alcohol use: Yes    Comment: occasional  . Drug use: No    FAMILY HISTORY: Family History  Problem Relation Age of Onset  . Esophageal cancer Father   . Heart disease Father        Unknown what kind  . Hyperlipidemia Father   . Hypertension Father   . Cancer Father   . Heart failure Father   . Hyperlipidemia Mother   . Hypertension Mother   . CAD Sister        73 years younger than patient - had stent. Was  told that her anorexia/bulimia may have played a role  . Stroke Maternal Grandmother 66  . Diabetes Maternal Grandmother   . Heart attack Maternal Grandfather 93  . COPD Maternal Grandfather   . Heart attack Paternal Grandmother        41s  .  Diabetes Unknown        MGGM    ROS: Review of Systems  Constitutional: Negative for weight loss.  Cardiovascular: Negative for chest pain.  Gastrointestinal: Negative for nausea and vomiting.  Neurological: Negative for dizziness and headaches.  Endo/Heme/Allergies:       Negative for hypoglycemia    PHYSICAL EXAM: Pt in no acute distress  RECENT LABS AND TESTS: BMET    Component Value Date/Time   NA 140 04/20/2018 1130   K 4.5 04/20/2018 1130   CL 101 04/20/2018 1130   CO2 22 04/20/2018 1130   GLUCOSE 97 04/20/2018 1130   GLUCOSE 147 (H) 12/08/2016 1436   BUN 15 04/20/2018 1130   CREATININE 0.74 04/20/2018 1130   CALCIUM 9.0 04/20/2018 1130   GFRNONAA 80 04/20/2018 1130   GFRAA 92 04/20/2018 1130   Lab Results  Component Value Date   HGBA1C 5.7 (H) 04/20/2018   HGBA1C 5.3 01/06/2018   HGBA1C 5.4 09/28/2017   HGBA1C 5.5 06/09/2017   HGBA1C 8.2 (H) 02/23/2017   Lab Results  Component Value Date   INSULIN 27.9 (H) 04/20/2018   INSULIN 24.6 01/06/2018   INSULIN 17.0 09/28/2017   INSULIN 28.3 (H) 06/09/2017   INSULIN 30.7 (H) 02/23/2017   CBC    Component Value Date/Time   WBC 6.2 09/28/2017 0906   WBC 6.8 12/15/2016 1159   RBC 4.07 09/28/2017 0906   RBC 4.23 12/15/2016 1159   HGB 11.8 09/28/2017 0906   HCT 35.4 09/28/2017 0906   PLT 242 12/15/2016 1159   MCV 87 09/28/2017 0906   MCH 29.0 09/28/2017 0906   MCH 30.3 12/15/2016 1159   MCHC 33.3 09/28/2017 0906   MCHC 34.8 12/15/2016 1159   RDW 14.3 09/28/2017 0906   LYMPHSABS 1.5 09/28/2017 0906   MONOABS 0.5 06/04/2016 0808   EOSABS 0.2 09/28/2017 0906   BASOSABS 0.0 09/28/2017 0906   Iron/TIBC/Ferritin/ %Sat    Component Value Date/Time   IRON 109  03/02/2017 1035   TIBC 333 03/19/2015 1448   FERRITIN 218.0 03/02/2017 1035   IRONPCTSAT 15 03/19/2015 1448   Lipid Panel     Component Value Date/Time   CHOL 226 (H) 04/20/2018 1130   TRIG 216 (H) 04/20/2018 1130   TRIG 101 05/19/2006 0832   HDL 54 04/20/2018 1130   CHOLHDL 4 06/04/2016 0808   VLDL 31.4 06/04/2016 0808   LDLCALC 129 (H) 04/20/2018 1130   LDLDIRECT 141.3 09/06/2012 0907   Hepatic Function Panel     Component Value Date/Time   PROT 6.5 04/20/2018 1130   ALBUMIN 4.2 04/20/2018 1130   AST 16 04/20/2018 1130   ALT 20 04/20/2018 1130   ALKPHOS 88 04/20/2018 1130   BILITOT 0.3 04/20/2018 1130   BILIDIR 0.1 12/04/2013 1724      Component Value Date/Time   TSH 4.050 09/28/2017 0906   TSH 4.810 (H) 02/23/2017 1007   TSH 4.22 06/04/2016 0808     Ref. Range 04/20/2018 11:30  Vitamin D, 25-Hydroxy Latest Ref Range: 30.0 - 100.0 Roach/mL 46.7    I, Doreene Nest, am acting as Location manager for Dennard Nip, MD I have reviewed the above documentation for accuracy and completeness, and I agree with the above. -Dennard Nip, MD

## 2018-12-09 ENCOUNTER — Encounter: Payer: Self-pay | Admitting: Internal Medicine

## 2018-12-09 MED ORDER — HYDROCODONE-ACETAMINOPHEN 5-325 MG PO TABS: 1.0000 | ORAL_TABLET | Freq: Every day | ORAL | 0 refills | Status: DC

## 2018-12-11 ENCOUNTER — Other Ambulatory Visit (INDEPENDENT_AMBULATORY_CARE_PROVIDER_SITE_OTHER): Payer: Self-pay | Admitting: Family Medicine

## 2018-12-11 DIAGNOSIS — E119 Type 2 diabetes mellitus without complications: Secondary | ICD-10-CM

## 2018-12-13 ENCOUNTER — Encounter (INDEPENDENT_AMBULATORY_CARE_PROVIDER_SITE_OTHER): Payer: Self-pay | Admitting: Family Medicine

## 2018-12-13 ENCOUNTER — Other Ambulatory Visit: Payer: Self-pay

## 2018-12-13 ENCOUNTER — Other Ambulatory Visit (INDEPENDENT_AMBULATORY_CARE_PROVIDER_SITE_OTHER): Payer: Self-pay | Admitting: Family Medicine

## 2018-12-13 ENCOUNTER — Ambulatory Visit (INDEPENDENT_AMBULATORY_CARE_PROVIDER_SITE_OTHER): Payer: Medicare Other | Admitting: Family Medicine

## 2018-12-13 VITALS — BP 106/67 | HR 83 | Temp 98.0°F | Ht 66.0 in | Wt 198.0 lb

## 2018-12-13 DIAGNOSIS — E559 Vitamin D deficiency, unspecified: Secondary | ICD-10-CM | POA: Diagnosis not present

## 2018-12-13 DIAGNOSIS — Z6832 Body mass index (BMI) 32.0-32.9, adult: Secondary | ICD-10-CM | POA: Diagnosis not present

## 2018-12-13 DIAGNOSIS — E119 Type 2 diabetes mellitus without complications: Secondary | ICD-10-CM

## 2018-12-13 DIAGNOSIS — J3089 Other allergic rhinitis: Secondary | ICD-10-CM | POA: Diagnosis not present

## 2018-12-13 DIAGNOSIS — I10 Essential (primary) hypertension: Secondary | ICD-10-CM | POA: Diagnosis not present

## 2018-12-13 DIAGNOSIS — E669 Obesity, unspecified: Secondary | ICD-10-CM | POA: Diagnosis not present

## 2018-12-13 MED ORDER — VICTOZA 18 MG/3ML ~~LOC~~ SOPN: PEN_INJECTOR | SUBCUTANEOUS | 0 refills | Status: DC

## 2018-12-13 MED ORDER — LOSARTAN POTASSIUM 100 MG PO TABS: 100.0000 mg | ORAL_TABLET | Freq: Every day | ORAL | 0 refills | Status: DC

## 2018-12-13 MED ORDER — METFORMIN HCL 500 MG PO TABS: ORAL_TABLET | ORAL | 0 refills | Status: DC

## 2018-12-13 MED ORDER — FLUTICASONE PROPIONATE 50 MCG/ACT NA SUSP: 1.0000 | Freq: Every day | NASAL | 0 refills | Status: DC

## 2018-12-13 MED ORDER — VITAMIN D (ERGOCALCIFEROL) 1.25 MG (50000 UNIT) PO CAPS: 50000.0000 [IU] | ORAL_CAPSULE | ORAL | 0 refills | Status: DC

## 2018-12-14 ENCOUNTER — Other Ambulatory Visit (INDEPENDENT_AMBULATORY_CARE_PROVIDER_SITE_OTHER): Payer: Self-pay | Admitting: Family Medicine

## 2018-12-14 DIAGNOSIS — J3089 Other allergic rhinitis: Secondary | ICD-10-CM

## 2018-12-14 LAB — HEMOGLOBIN A1C
Est. average glucose Bld gHb Est-mCnc: 108 mg/dL
Hgb A1c MFr Bld: 5.4 % (ref 4.8–5.6)

## 2018-12-14 LAB — COMPREHENSIVE METABOLIC PANEL
ALT: 38 IU/L — ABNORMAL HIGH (ref 0–32)
AST: 31 IU/L (ref 0–40)
Albumin/Globulin Ratio: 1.8 (ref 1.2–2.2)
Albumin: 4.4 g/dL (ref 3.7–4.7)
Alkaline Phosphatase: 77 IU/L (ref 39–117)
BUN/Creatinine Ratio: 21 (ref 12–28)
BUN: 21 mg/dL (ref 8–27)
Bilirubin Total: 0.4 mg/dL (ref 0.0–1.2)
CO2: 23 mmol/L (ref 20–29)
Calcium: 9 mg/dL (ref 8.7–10.3)
Chloride: 100 mmol/L (ref 96–106)
Creatinine, Ser: 0.99 mg/dL (ref 0.57–1.00)
GFR calc Af Amer: 64 mL/min/{1.73_m2} (ref >59)
GFR calc non Af Amer: 56 mL/min/{1.73_m2} — ABNORMAL LOW (ref >59)
Globulin, Total: 2.5 g/dL (ref 1.5–4.5)
Glucose: 87 mg/dL (ref 65–99)
Potassium: 4.2 mmol/L (ref 3.5–5.2)
Sodium: 137 mmol/L (ref 134–144)
Total Protein: 6.9 g/dL (ref 6.0–8.5)

## 2018-12-14 LAB — LIPID PANEL WITH LDL/HDL RATIO
Cholesterol, Total: 190 mg/dL (ref 100–199)
HDL: 54 mg/dL (ref >39)
LDL Calculated: 104 mg/dL — ABNORMAL HIGH (ref 0–99)
LDl/HDL Ratio: 1.9 ratio (ref 0.0–3.2)
Triglycerides: 160 mg/dL — ABNORMAL HIGH (ref 0–149)
VLDL Cholesterol Cal: 32 mg/dL (ref 5–40)

## 2018-12-14 LAB — VITAMIN B12: Vitamin B-12: 567 pg/mL (ref 232–1245)

## 2018-12-14 LAB — T4, FREE: Free T4: 0.93 ng/dL (ref 0.82–1.77)

## 2018-12-14 LAB — T3: T3, Total: 117 ng/dL (ref 71–180)

## 2018-12-14 LAB — INSULIN, RANDOM: INSULIN: 13.3 u[IU]/mL (ref 2.6–24.9)

## 2018-12-14 LAB — TSH: TSH: 5.13 u[IU]/mL — ABNORMAL HIGH (ref 0.450–4.500)

## 2018-12-14 LAB — VITAMIN D 25 HYDROXY (VIT D DEFICIENCY, FRACTURES): Vit D, 25-Hydroxy: 52.4 ng/mL (ref 30.0–100.0)

## 2018-12-14 NOTE — Progress Notes (Signed)
Office: [phone]  /  Fax: [phone]   HPI:   Chief Complaint: OBESITY Erin Roach is here to discuss her progress with her obesity treatment plan. She is on the keep a food journal with 1500 calories and 80+ grams of protein daily plan and is following her eating plan approximately 30 % of the time. She states she is biking and walking 20 minutes 4 times per week. [name]'s last visit in the office was five months ago due to Covid-19 pandemic. She has only gained two pounds during that time. She has not been journaling as much. She has mostly controlled her portions and made better food choices. Her weight is 198 lb (89.8 kg) today and has had a weight gain of 2 pounds since her last in-office visit. She has lost 25 lbs since starting treatment with [name].  Diabetes II Erin Roach has a diagnosis of diabetes type II. [name] checks her blood sugar sometimes, and fasting BGs seem to average in the 120's and 2 hour post prandial BGs range in the 140's per patient. [name] denies any hypoglycemic episodes. She has been working on intensive lifestyle modifications including diet, exercise, and weight loss to help control her blood glucose levels.  Vitamin D deficiency Erin Roach has a diagnosis of vitamin D deficiency. Erin Roach is stable on vit D and her last labs were almost at goal. She denies nausea, vomiting or muscle weakness.  Hypertension Erin Roach is a 78 y.o. female with hypertension. Erin Roach denies chest pain, headache or dizziness. She is working weight loss to help control her blood pressure with the goal of decreasing her risk of heart attack and stroke. Erin Roach blood pressure is well controlled.  Allergic Rhinitis [name] is stable on Flonase and she denies epistaxis.  ASSESSMENT AND PLAN:  Type 2 diabetes mellitus without complication, without long-term current use of insulin (HCC) - Plan: Comprehensive metabolic panel, Hemoglobin A1c, Insulin, random, Lipid Panel With LDL/HDL Ratio,  Vitamin B12, T3, T4, free, TSH, Microalbumin / creatinine urine ratio, metFORMIN (GLUCOPHAGE) 500 MG tablet, liraglutide (VICTOZA) 18 MG/3ML SOPN  Vitamin D deficiency - Plan: VITAMIN D 25 Hydroxy (Vit-D Deficiency, Fractures), Vitamin D, Ergocalciferol, (DRISDOL) 1.25 MG (50000 UT) CAPS capsule  Seasonal allergic rhinitis due to other allergic trigger - Plan: fluticasone (FLONASE) 50 MCG/ACT nasal spray  Essential hypertension  Class 1 obesity with serious comorbidity and body mass index (BMI) of 32.0 to 32.9 in adult, unspecified obesity type  PLAN:  Diabetes II Erin Roach has been given extensive diabetes education by myself today including ideal fasting and post-prandial blood glucose readings, individual ideal Hgb A1c goals and hypoglycemia prevention. We discussed the importance of good blood sugar control to decrease the likelihood of diabetic complications such as nephropathy, neuropathy, limb loss, blindness, coronary artery disease, and death. We discussed the importance of intensive lifestyle modification including diet, exercise and weight loss as the first line treatment for diabetes. Erin Roach agrees to continue metformin 500 mg two times daily with a meal #60 with no refills and Victoza 1.8 mg sub Q daily #3 pens with no refills and follow up at the agreed upon time.  Vitamin D Deficiency Erin Roach was informed that low vitamin D levels contributes to fatigue and are associated with obesity, breast, and colon cancer. She agrees to continue to take prescription Vit D '@50' ,000 IU every week #4 with no refills  and will follow up for routine testing of vitamin D, at least 2-3 times per year. She was informed of  the risk of over-replacement of vitamin D and agrees to not increase her dose unless she discusses this with [name] first. Erin Roach agrees to follow up with our clinic in 2 to 3 weeks.  Hypertension We discussed sodium restriction, working on healthy weight loss, and a regular exercise program as  the means to achieve improved blood pressure control. Erin Roach agreed with this plan and agreed to follow up as directed. We will continue to monitor her blood pressure as well as her progress with the above lifestyle modifications. We will check labs and Erin Roach agrees to continue losartan 100 mg daily #30 with no refills. We will check labs and Erin Roach will watch for signs of hypotension as she continues her lifestyle modifications.   Allergic Rhinitis Ajanay agrees to continue Flonase 1 spray into both nostrils daily and follow up as directed.  Obesity Erin Roach is currently in the action stage of change. As such, her goal is to continue with weight loss efforts She has agreed to keep a food journal with 1500 calories and 85+ grams of protein daily Erin Roach has been instructed to work up to a goal of 150 minutes of combined cardio and strengthening exercise per week for weight loss and overall health benefits. We discussed the following Behavioral Modification Strategies today: keep a strict food journal, increasing lean protein intake and work on meal planning and easy cooking plans  Erin Roach has agreed to follow up with our clinic in 2 to 3 weeks. She was informed of the importance of frequent follow up visits to maximize her success with intensive lifestyle modifications for her multiple health conditions.  ALLERGIES: No Known Allergies  MEDICATIONS: Current Outpatient Medications on File Prior to Visit  Medication Sig Dispense Refill  . albuterol (PROAIR HFA) 108 (90 Base) MCG/ACT inhaler Inhale 2 puffs into the lungs every 6 (six) hours as needed for wheezing or shortness of breath. 1 Inhaler 5  . Blood Glucose Monitoring Suppl (ONE TOUCH ULTRA 2) w/Device KIT USE TO TEST BLOOD SUGARS UP TO 4 TIMES DAILY 1 each 0  . budesonide-formoterol (SYMBICORT) 160-4.5 MCG/ACT inhaler Inhale 2 puffs into the lungs daily. 1 Inhaler 5  . Cholecalciferol (VITAMIN D) 2000 units tablet Take 1 tablet (2,000 Units total)  by mouth daily.    . furosemide (LASIX) 20 MG tablet TAKE ONE TABLET BY MOUTH DAILY 90 tablet 1  . glucose blood (ACCU-CHEK AVIVA) test strip Testing twice daily 100 each 0  . HORIZANT 600 MG TBCR     . HYDROcodone-acetaminophen (NORCO/VICODIN) 5-325 MG tablet Take 1 tablet by mouth at bedtime. 30 tablet 0  . Insulin Pen Needle (BD PEN NEEDLE NANO 2ND GEN) 32G X 4 MM MISC 1 Package by Does not apply route 2 (two) times daily. 100 each 0  . pramipexole (MIRAPEX) 0.25 MG tablet Take 0.5 mg by mouth 2 (two) times daily. 1630  And  Bedtime    . pregabalin (LYRICA) 75 MG capsule TAKE ONE CAPSULE BY MOUTH EVERY AFTERNOON AND TAKE TWO CAPSULES BY MOUTH EVERY NIGHT AT BEDTIME 270 capsule 0  . simvastatin (ZOCOR) 40 MG tablet Take 1 tablet (40 mg total) by mouth at bedtime. -- Office visit needed for further refills 90 tablet 0  . zolpidem (AMBIEN) 5 MG tablet Take 1/2 tab po QHS PRN 10 tablet 0   No current facility-administered medications on file prior to visit.     PAST MEDICAL HISTORY: Past Medical History:  Diagnosis Date  . Allergic rhinitis   .  Apnea   . Arthritis   . Asthma    very mild  . Back pain   . Bilateral lower extremity edema   . Carotid artery disease ([place])    a. Carotid duplex 03/2016 - duplex was stable, 40-98% RICA, 1-19% LICA  . Complication of anesthesia    post-op delirium  . Endometrial polyp   . Essential hypertension   . GERD (gastroesophageal reflux disease)   . Heart murmur    per pt  . History of adenomatous polyp of colon    04-24-2016  tubular adenoma  . History of squamous cell carcinoma excision    pilonidal area second excision 02-02-2002  . HTN (hypertension)   . Hyperlipidemia   . Leg pain   . OAB (overactive bladder)   . RA (rheumatoid arthritis) ([place])   . RLS (restless legs syndrome)   . Shortness of breath on exertion   . SUI (stress urinary incontinence, female)   . Venous insufficiency    legs  . Vitamin D deficiency     PAST  SURGICAL HISTORY: Past Surgical History:  Procedure Laterality Date  . APPENDECTOMY  07-02-2005   [name]   open  . CARDIAC CATHETERIZATION  01-23-2009  dr Darnell Level brodie   normal coronary arteries and lvf  . CARDIOVASCULAR STRESS TEST  03/31/2016   Low risk nuclear study w/ medium defect of mild severity in basal anterior and mid anterior location w/ no evidence ischemia or infarction/  normal LV function and wall motion,  nuclear stress ef 71%  . CATARACT EXTRACTION W/ INTRAOCULAR LENS IMPLANT Left 09/2016  . COLONOSCOPY  last one 04-24-2016  . HYSTEROSCOPY W/D&C N/A 12/22/2016   Procedure: DILATATION AND CURETTAGE /HYSTEROSCOPY;  Surgeon: Dian Queen, MD;  Location: [facility];  Service: Gynecology;  Laterality: N/A;  . IR US GUIDANCE  09/08/2016  . POSTERIOR LUMBAR FUSION  02/ 2018    [facility] ([place], )  . SKIN CANCER EXCISION    . TONSILLECTOMY  child  . 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 TRANSTHORACIC ECHOCARDIOGRAM  01/21/2009   mild LVH, ef 14-78%, grade 1 diastolic dysfunction  . ULNAR NERVE TRANSPOSITION Left 12-20-2008  dr sypher   decompression and partial resection medial triceps fascia  . WIDE LOCAL EXCISION PILONIDAL AREA  02-02-2002  [name]   recurrent squamous cell carcinoma in situ    SOCIAL HISTORY: Social History   Tobacco Use  . Smoking status: Former Smoker    Packs/day: 0.50    Years: 10.00    Pack years: 5.00    Types: Cigarettes    Quit date: 12/10/1973    Years since quitting: 45.0  . Smokeless tobacco: Never Used  Substance Use Topics  . Alcohol use: Yes    Comment: occasional  . Drug use: No    FAMILY HISTORY: Family History  Problem Relation Age of Onset  . Esophageal cancer Father   . Heart disease Father        Unknown what kind  . Hyperlipidemia Father   .  Hypertension Father   . Cancer Father   . Heart failure Father   . Hyperlipidemia Mother   . Hypertension Mother   . CAD Sister        78 years younger than patient - had stent. Was told that her anorexia/bulimia may have played a  role  . Stroke Maternal Grandmother 66  . Diabetes Maternal Grandmother   . Heart attack Maternal Grandfather 93  . COPD Maternal Grandfather   . Heart attack Paternal Grandmother        62s  . Diabetes Unknown        MGGM    ROS: Review of Systems  Constitutional: Negative for weight loss.  HENT: Negative for nosebleeds.   Cardiovascular: Negative for chest pain.  Gastrointestinal: Negative for nausea and vomiting.  Musculoskeletal:       Negative for muscle weakness  Neurological: Negative for dizziness and headaches.  Endo/Heme/Allergies:       Negative for hypoglycemia    PHYSICAL EXAM: Blood pressure 106/67, pulse 83, temperature 98 F (36.7 C), temperature source Oral, height '5\' 6"'  (1.676 m), weight 198 lb (89.8 kg), SpO2 100 %. Body mass index is 31.96 kg/m. Physical Exam Vitals signs reviewed.  Constitutional:      Appearance: Normal appearance. She is well-developed. She is obese.  Cardiovascular:     Rate and Rhythm: Normal rate.  Pulmonary:     Effort: Pulmonary effort is normal.  Musculoskeletal: Normal range of motion.  Skin:    General: Skin is warm and dry.  Neurological:     Mental Status: She is alert and oriented to person, place, and time.  Psychiatric:        Mood and Affect: Mood normal.        Behavior: Behavior normal.     RECENT LABS AND TESTS: BMET    Component Value Date/Time   NA 137 12/13/2018 0935   K 4.2 12/13/2018 0935   CL 100 12/13/2018 0935   CO2 23 12/13/2018 0935   GLUCOSE 87 12/13/2018 0935   GLUCOSE 147 (H) 12/08/2016 1436   BUN 21 12/13/2018 0935   CREATININE 0.99 12/13/2018 0935   CALCIUM 9.0 12/13/2018 0935   GFRNONAA 56 (L) 12/13/2018 0935   GFRAA 64 12/13/2018 0935   Lab Results   Component Value Date   HGBA1C 5.4 12/13/2018   HGBA1C 5.7 (H) 04/20/2018   HGBA1C 5.3 01/06/2018   HGBA1C 5.4 09/28/2017   HGBA1C 5.5 06/09/2017   Lab Results  Component Value Date   INSULIN WILL FOLLOW 12/13/2018   INSULIN 27.9 (H) 04/20/2018   INSULIN 24.6 01/06/2018   INSULIN 17.0 09/28/2017   INSULIN 28.3 (H) 06/09/2017   CBC    Component Value Date/Time   WBC 6.2 09/28/2017 0906   WBC 6.8 12/15/2016 1159   RBC 4.07 09/28/2017 0906   RBC 4.23 12/15/2016 1159   HGB 11.8 09/28/2017 0906   HCT 35.4 09/28/2017 0906   PLT 242 12/15/2016 1159   MCV 87 09/28/2017 0906   MCH 29.0 09/28/2017 0906   MCH 30.3 12/15/2016 1159   MCHC 33.3 09/28/2017 0906   MCHC 34.8 12/15/2016 1159   RDW 14.3 09/28/2017 0906   LYMPHSABS 1.5 09/28/2017 0906   MONOABS 0.5 06/04/2016 0808   EOSABS 0.2 09/28/2017 0906   BASOSABS 0.0 09/28/2017 0906   Iron/TIBC/Ferritin/ %Sat    Component Value Date/Time   IRON 109 03/02/2017 1035   TIBC 333 03/19/2015 1448   FERRITIN 218.0 03/02/2017 1035   IRONPCTSAT 15 03/19/2015 1448   Lipid Panel     Component Value Date/Time   CHOL 190 12/13/2018 0935   TRIG 160 (H) 12/13/2018 0935   TRIG 101 05/19/2006 0832   HDL 54 12/13/2018 0935   CHOLHDL 4 06/04/2016 0808   VLDL 31.4 06/04/2016 5449  LDLCALC 104 (H) 12/13/2018 0935   LDLDIRECT 141.3 09/06/2012 0907   Hepatic Function Panel     Component Value Date/Time   PROT 6.9 12/13/2018 0935   ALBUMIN 4.4 12/13/2018 0935   AST 31 12/13/2018 0935   ALT 38 (H) 12/13/2018 0935   ALKPHOS 77 12/13/2018 0935   BILITOT 0.4 12/13/2018 0935   BILIDIR 0.1 12/04/2013 1724      Component Value Date/Time   TSH 5.130 (H) 12/13/2018 0935   TSH 4.050 09/28/2017 0906   TSH 4.810 (H) 02/23/2017 1007     Ref. Range 04/20/2018 11:30  Vitamin D, 25-Hydroxy Latest Ref Range: 30.0 - 100.0 ng/mL 46.7    OBESITY BEHAVIORAL INTERVENTION VISIT  Today's visit was # 31  Starting weight: 223 lbs Starting date:  02/23/2017 Today's weight : 198 lbs  Today's date: 12/13/2018 Total lbs lost to date: 25    12/13/2018  Height '5\' 6"'  (1.676 m)  Weight 198 lb (89.8 kg)  BMI (Calculated) 31.97  BLOOD PRESSURE - SYSTOLIC 898  BLOOD PRESSURE - DIASTOLIC 67   Body Fat % 43 %  Total Body Water (lbs) 85.6 lbs    ASK: We discussed the diagnosis of obesity with Erin Roach today and Analeah agreed to give [name] permission to discuss obesity behavioral modification therapy today.  ASSESS: Melida has the diagnosis of obesity and her BMI today is 31.97 Diem is in the action stage of change   ADVISE: Nella was educated on the multiple health risks of obesity as well as the benefit of weight loss to improve her health. She was advised of the need for long term treatment and the importance of lifestyle modifications to improve her current health and to decrease her risk of future health problems.  AGREE: Multiple dietary modification options and treatment options were discussed and  Zaleah agreed to follow the recommendations documented in the above note.  ARRANGE: Latayvia was educated on the importance of frequent visits to treat obesity as outlined per CMS and USPSTF guidelines and agreed to schedule her next follow up appointment today.  I, Doreene Nest, am acting as transcriptionist for Dennard Nip, MD I have reviewed the above documentation for accuracy and completeness, and I agree with the above. -Dennard Nip, MD

## 2018-12-15 ENCOUNTER — Telehealth (INDEPENDENT_AMBULATORY_CARE_PROVIDER_SITE_OTHER): Payer: Medicare Other | Admitting: Family Medicine

## 2018-12-29 ENCOUNTER — Other Ambulatory Visit: Payer: Self-pay

## 2018-12-29 ENCOUNTER — Ambulatory Visit (INDEPENDENT_AMBULATORY_CARE_PROVIDER_SITE_OTHER): Payer: Medicare Other | Admitting: Internal Medicine

## 2018-12-29 ENCOUNTER — Ambulatory Visit: Payer: Self-pay | Admitting: *Deleted

## 2018-12-29 ENCOUNTER — Encounter: Payer: Self-pay | Admitting: Internal Medicine

## 2018-12-29 VITALS — BP 160/100 | HR 115 | Temp 99.2°F | Ht 66.0 in | Wt 203.0 lb

## 2018-12-29 DIAGNOSIS — L03113 Cellulitis of right upper limb: Secondary | ICD-10-CM

## 2018-12-29 DIAGNOSIS — E119 Type 2 diabetes mellitus without complications: Secondary | ICD-10-CM

## 2018-12-29 DIAGNOSIS — J45909 Unspecified asthma, uncomplicated: Secondary | ICD-10-CM | POA: Diagnosis not present

## 2018-12-29 DIAGNOSIS — I10 Essential (primary) hypertension: Secondary | ICD-10-CM | POA: Diagnosis not present

## 2018-12-29 MED ORDER — SULFAMETHOXAZOLE-TRIMETHOPRIM 800-160 MG PO TABS: 1.0000 | ORAL_TABLET | Freq: Two times a day (BID) | ORAL | 0 refills | Status: DC

## 2018-12-29 MED ORDER — CEFTRIAXONE SODIUM 1 G IJ SOLR
1.0000 g | Freq: Once | INTRAMUSCULAR | Status: AC
  Administered 2018-12-29: 1 g via INTRAMUSCULAR

## 2018-12-29 MED ORDER — TRAMADOL HCL 50 MG PO TABS: 50.0000 mg | ORAL_TABLET | Freq: Four times a day (QID) | ORAL | 0 refills | Status: DC | PRN

## 2018-12-29 NOTE — Patient Instructions (Addendum)
You had the antibiotic shot today (rocephin)  Please take all new medication as prescribed - the pill antibiotic, and pain medication  Please have your husband check the area daily; and if you have worsening fever, pain, swelling, and especially redness that moves beyond the pen marks, you should go to the ED  Please continue all other medications as before, and refills have been done if requested.  Please have the pharmacy call with any other refills you may need.  Please keep your appointments with your specialists as you may have planned

## 2018-12-29 NOTE — Telephone Encounter (Signed)
Due to available, patient has been scheduled with Dr Jenny Reichmann this afternoon.

## 2018-12-29 NOTE — Telephone Encounter (Signed)
Husband, Erin Roach called in however Erin Roach was there with him.   She feel Tuesday night and cut her right elbow.   It is now oozing a clear fluid and she is c/o of it starting to hurt.   "I didn't know if she needed stitches or if it was getting infected".   It's not bleeding and she's not on blood thinners.  See triage notes.  I warm transferred the call into the office of Dr. Quay Burow to be scheduled by Elnita Maxwell.    I sent my notes to the office.   Reason for Disposition . [1] Looks infected (spreading redness, pus) AND [2] no fever  Answer Assessment - Initial Assessment Questions 1. APPEARANCE of INJURY: "What does the injury look like?"      Husband calling in for [name] but she is with him.   She is on medication for restless legs syndrome.   The dr increased the medication and made her dizzy it's amitripline.   The dose is reduced now since she fell.     She fell Tuesday night.   She landed on her knees and cut her elbow.     Her elbow hit the floor.    She fell and cut her elbow.   No blood thinners. 2. SIZE: "How large is the cut?"      2 inches. 3. BLEEDING: "Is it bleeding now?" If so, ask: "Is it difficult to stop?"      I stopped the bleeding but it's oozing now.   I've wrapped it in gauze.   It's also hurting her.   4. LOCATION: "Where is the injury located?"      Right elbow over the bone 5. ONSET: "How long ago did the injury occur?"      Tuesday night 6. MECHANISM: "Tell me how it happened."      She fell in the hallway on the hardwood floor.   She was dizzy from the restless legs medication.     7. TETANUS: "When was the last tetanus booster?"     Not sure 8. PREGNANCY: "Is there any chance you are pregnant?" "When was your last menstrual period?"     Not asked due to age  Protocols used: [place]

## 2018-12-29 NOTE — Assessment & Plan Note (Signed)
No abscess or sepsis, for rocephin 1 gm IM, septra ds course, and f/u Ed for any worsening red, swelling, tender

## 2018-12-29 NOTE — Progress Notes (Signed)
Subjective:    Patient ID: Erin Roach, female    DOB: [birth year], 78 y.o.   MRN: [id number]  HPI  Here 2 days after a fall at home striking the right elbow near the tip with a laceration with bleeding stopped within an hour and sort of throbbed that night, but the yesterday with onset gradually worsening red, swelling, tender and mild d/c; husband wrapped up for her and has yellowish d/c.  No high fever, chills, but really couldn't sleep last night due to pain worse with any movement, nothing makes better.  Pt denies chest pain, increased sob or doe, wheezing, orthopnea, PND, increased LE swelling, palpitations, dizziness or syncope.   Pt denies polydipsia, polyuria Past Medical History:  Diagnosis Date  . Allergic rhinitis   . Apnea   . Arthritis   . Asthma    very mild  . Back pain   . Bilateral lower extremity edema   . Carotid artery disease ([place])    a. Carotid duplex 03/2016 - duplex was stable, 13-24% RICA, 4-01% LICA  . Complication of anesthesia    post-op delirium  . Endometrial polyp   . Essential hypertension   . GERD (gastroesophageal reflux disease)   . Heart murmur    per pt  . History of adenomatous polyp of colon    04-24-2016  tubular adenoma  . History of squamous cell carcinoma excision    pilonidal area second excision 02-02-2002  . HTN (hypertension)   . Hyperlipidemia   . Leg pain   . OAB (overactive bladder)   . RA (rheumatoid arthritis) ([place])   . RLS (restless legs syndrome)   . Shortness of breath on exertion   . SUI (stress urinary incontinence, female)   . Venous insufficiency    legs  . Vitamin D deficiency    Past Surgical History:  Procedure Laterality Date  . APPENDECTOMY  07-02-2005   [name]   open  . CARDIAC CATHETERIZATION  01-23-2009  dr Darnell Level brodie   normal coronary arteries and lvf  . CARDIOVASCULAR STRESS TEST  03/31/2016   Low risk nuclear study w/ medium defect of mild severity in basal anterior and mid anterior  location w/ no evidence ischemia or infarction/  normal LV function and wall motion,  nuclear stress ef 71%  . CATARACT EXTRACTION W/ INTRAOCULAR LENS IMPLANT Left 09/2016  . COLONOSCOPY  last one 04-24-2016  . HYSTEROSCOPY W/D&C N/A 12/22/2016   Procedure: DILATATION AND CURETTAGE /HYSTEROSCOPY;  Surgeon: Dian Queen, MD;  Location: [facility];  Service: Gynecology;  Laterality: N/A;  . IR US GUIDANCE  09/08/2016  . POSTERIOR LUMBAR FUSION  02/ 2018    [facility] ([place], [place])  . SKIN CANCER EXCISION    . TONSILLECTOMY  child  . 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 TRANSTHORACIC ECHOCARDIOGRAM  01/21/2009   mild LVH, ef 02-72%, grade 1 diastolic dysfunction  . ULNAR NERVE TRANSPOSITION Left 12-20-2008  dr sypher   decompression and partial resection medial triceps fascia  . WIDE LOCAL EXCISION PILONIDAL AREA  02-02-2002  [name]   recurrent squamous cell carcinoma in situ    reports that she quit smoking about 45 years ago. Her smoking use included cigarettes. She has a 5.00 pack-year smoking history. She has never used smokeless tobacco. She reports current alcohol use. She reports that  she does not use drugs. family history includes CAD in her sister; COPD in her maternal grandfather; Cancer in her father; Diabetes in her maternal grandmother and another family member; Esophageal cancer in her father; Heart attack in her paternal grandmother; Heart attack (age of onset: 73) in her maternal grandfather; Heart disease in her father; Heart failure in her father; Hyperlipidemia in her father and mother; Hypertension in her father and mother; Stroke (age of onset: 45) in her maternal grandmother. No Known Allergies Current Outpatient Medications on File Prior to Visit  Medication Sig Dispense Refill  . albuterol (PROAIR HFA) 108  (90 Base) MCG/ACT inhaler Inhale 2 puffs into the lungs every 6 (six) hours as needed for wheezing or shortness of breath. 1 Inhaler 5  . Blood Glucose Monitoring Suppl (ONE TOUCH ULTRA 2) w/Device KIT USE TO TEST BLOOD SUGARS UP TO 4 TIMES DAILY 1 each 0  . budesonide-formoterol (SYMBICORT) 160-4.5 MCG/ACT inhaler Inhale 2 puffs into the lungs daily. 1 Inhaler 5  . Cholecalciferol (VITAMIN D) 2000 units tablet Take 1 tablet (2,000 Units total) by mouth daily.    . fluticasone (FLONASE) 50 MCG/ACT nasal spray Place 1 spray into both nostrils daily. 16 g 0  . furosemide (LASIX) 20 MG tablet TAKE ONE TABLET BY MOUTH DAILY 90 tablet 1  . glucose blood (ACCU-CHEK AVIVA) test strip Testing twice daily 100 each 0  . HORIZANT 600 MG TBCR     . HYDROcodone-acetaminophen (NORCO/VICODIN) 5-325 MG tablet Take 1 tablet by mouth at bedtime. 30 tablet 0  . Insulin Pen Needle (BD PEN NEEDLE NANO 2ND GEN) 32G X 4 MM MISC 1 Package by Does not apply route 2 (two) times daily. 100 each 0  . liraglutide (VICTOZA) 18 MG/3ML SOPN DIAL AND INJECT SUBCUTANEOUSLY 1.8MG DAILY EVERY MORNING 3 pen 0  . losartan (COZAAR) 100 MG tablet Take 1 tablet (100 mg total) by mouth daily. -- Office visit needed for further refills 90 tablet 0  . metFORMIN (GLUCOPHAGE) 500 MG tablet TAKE 1 TABLET BY MOUTH TWICE A DAY WITH A MEAL. 60 tablet 0  . pramipexole (MIRAPEX) 0.25 MG tablet Take 0.5 mg by mouth 2 (two) times daily. 1630  And  Bedtime    . pregabalin (LYRICA) 75 MG capsule TAKE ONE CAPSULE BY MOUTH EVERY AFTERNOON AND TAKE TWO CAPSULES BY MOUTH EVERY NIGHT AT BEDTIME 270 capsule 0  . simvastatin (ZOCOR) 40 MG tablet Take 1 tablet (40 mg total) by mouth at bedtime. -- Office visit needed for further refills 90 tablet 0  . Vitamin D, Ergocalciferol, (DRISDOL) 1.25 MG (50000 UT) CAPS capsule Take 1 capsule (50,000 Units total) by mouth every 7 (seven) days. 4 capsule 0  . zolpidem (AMBIEN) 5 MG tablet Take 1/2 tab po QHS PRN 10 tablet  0   No current facility-administered medications on file prior to visit.    Review of Systems  Constitutional: Negative for other unusual diaphoresis or sweats HENT: Negative for ear discharge or swelling Eyes: Negative for other worsening visual disturbances Respiratory: Negative for stridor or other swelling  Gastrointestinal: Negative for worsening distension or other blood Genitourinary: Negative for retention or other urinary change Musculoskeletal: Negative for other MSK pain or swelling Skin: Negative for color change or other new lesions Neurological: Negative for worsening tremors and other numbness  Psychiatric/Behavioral: Negative for worsening agitation or other fatigue All other system neg per pt    Objective:   Physical Exam BP (!) 160/100 (BP Location: Left Arm,  Patient Position: Sitting, Cuff Size: Normal)   Pulse (!) 115   Temp 99.2 F (37.3 C) (Oral)   Ht '5\' 6"'  (1.676 m)   Wt 203 lb (92.1 kg)   SpO2 99%   BMI 32.77 kg/m  VS noted,  Constitutional: Pt appears in NAD HENT: Head: NCAT.  Right Ear: External ear normal.  Left Ear: External ear normal.  Eyes: . Pupils are equal, round, and reactive to light. Conjunctivae and EOM are normal Nose: without d/c or deformity Neck: Neck supple. Gross normal ROM Cardiovascular: Normal rate and regular rhythm.   Pulmonary/Chest: Effort normal and breath sounds without rales or wheezing.  Right elbow area with shallow laceration 1 cm x 10 mm x 2 mm with yellowish oozing, no fluctuance found but had 3 cm proximal and 4 cm distal red, swelling, tender worst to the superior aspect of the laceration as well Neurological: Pt is alert. At baseline orientation, motor grossly intact Skin: Skin is warm. No rashes, other new lesions, no LE edema Psychiatric: Pt behavior is normal without agitation  No other exam findings Lab Results  Component Value Date   WBC 6.2 09/28/2017   HGB 11.8 09/28/2017   HCT 35.4 09/28/2017   PLT  242 12/15/2016   GLUCOSE 87 12/13/2018   CHOL 190 12/13/2018   TRIG 160 (H) 12/13/2018   HDL 54 12/13/2018   LDLDIRECT 141.3 09/06/2012   LDLCALC 104 (H) 12/13/2018   ALT 38 (H) 12/13/2018   AST 31 12/13/2018   NA 137 12/13/2018   K 4.2 12/13/2018   CL 100 12/13/2018   CREATININE 0.99 12/13/2018   BUN 21 12/13/2018   CO2 23 12/13/2018   TSH 5.130 (H) 12/13/2018   INR 1.03 03/27/2015   HGBA1C 5.4 12/13/2018       Assessment & Plan:

## 2018-12-29 NOTE — Assessment & Plan Note (Signed)
stable overall by history and exam, recent data reviewed with pt, and pt to continue medical treatment as before,  to f/u any worsening symptoms or concerns  

## 2018-12-29 NOTE — Assessment & Plan Note (Signed)
Mod elevated today, ? Reaactive, pt declines change in tx today

## 2018-12-29 NOTE — Telephone Encounter (Signed)
Noted  

## 2018-12-31 ENCOUNTER — Other Ambulatory Visit (INDEPENDENT_AMBULATORY_CARE_PROVIDER_SITE_OTHER): Payer: Self-pay | Admitting: Family Medicine

## 2018-12-31 DIAGNOSIS — E119 Type 2 diabetes mellitus without complications: Secondary | ICD-10-CM

## 2019-01-01 NOTE — Progress Notes (Signed)
Subjective:    Patient ID: Erin Roach, female    DOB: Mar 14, 1941, 78 y.o.   MRN: [id number]  HPI The patient is here for an acute visit.  Legs buckling, multiple falls: This started approximately couple weeks ago.  She is fallen approximately 6 times in the past 2 weeks.  She only hurt herself once-she sustained a skin tear and she was seen for that recently here.  Prior to 2 weeks ago she thinks one leg may have buckled or collapsed, but there has been a difference in the past couple of weeks.  Her most recent near fall was last night.  She was walking to the bathroom from the living room and felt her legs were getting weak.  She grabbed onto the counter and called her husband.  She had been doing 5 shallow knee bends and he did pass, but he had to support her.  When these episodes occur she states both her legs get weak.  She is a little bit of numbness and tingling, but has been diagnosed with peripheral neuropathy by her neurologist.  She has chronic left knee pain, but there has been no change.  She had that knee replaced and has never been quite right since.  She has not been exercising regularly.  She sees a neurologist in [place] for restless leg syndrome and neuropathy.  There has been some medication adjustments.  She states she is sleeping more than she had, but does feel a little drowsy first thing in the morning.  She does not feel that there has been any increase in confusion.  She denies any lower back pain.  She has had back surgery in the past and had to lower vertebrae fused.  She denies any changes in bowels or bladder.She does have some lightheadedness/dizziness at times.  She has been having some tremors-she does have a family history of tremors.  Her balance has not been good.  She denies any weakness in her arms or hands.        Medications and allergies reviewed with patient and updated if appropriate.  Patient Active Problem List   Diagnosis Date Noted   . Cellulitis of right elbow 12/29/2018  . OSA on CPAP 06/15/2017  . Diabetes ([place]) 03/02/2017  . Herpes zoster without complication 53/97/6734  . Prediabetes 02/23/2017  . Shortness of breath on exertion 02/23/2017  . B12 nutritional deficiency 02/23/2017  . Vitamin D deficiency 12/08/2016  . Tubular adenoma of colon 04/29/2016  . Essential hypertension 09/12/2015  . Allergic rhinitis 07/17/2015  . Insomnia 07/17/2015  . Obese 04/10/2015  . S/P left TKA 04/09/2015  . Osteopenia 12/04/2012  . 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 RESTLESS LEG SYNDROME 02/11/2009  . LEG EDEMA, BILATERAL 02/11/2009  . Hyperlipidemia 10/30/2008  . Urinary incontinence 10/30/2008  . Skin cancer 10/30/2008  . Asthma 11/21/2007  . Venous (peripheral) insufficiency 11/22/2006    Current Outpatient Medications on File Prior to Visit  Medication Sig Dispense Refill  . albuterol (PROAIR HFA) 108 (90 Base) MCG/ACT inhaler Inhale 2 puffs into the lungs every 6 (six) hours as needed for wheezing or shortness of breath. 1 Inhaler 5  . Blood Glucose Monitoring Suppl (ONE TOUCH ULTRA 2) w/Device KIT USE TO TEST BLOOD SUGARS UP TO 4 TIMES DAILY 1 each 0  . budesonide-formoterol (SYMBICORT) 160-4.5 MCG/ACT inhaler Inhale 2 puffs into the lungs daily. 1 Inhaler 5  . Cholecalciferol (VITAMIN D) 2000 units tablet Take 1 tablet (2,000 Units total)  by mouth daily.    . fluticasone (FLONASE) 50 MCG/ACT nasal spray Place 1 spray into both nostrils daily. 16 g 0  . furosemide (LASIX) 20 MG tablet TAKE ONE TABLET BY MOUTH DAILY 90 tablet 1  . glucose blood (ACCU-CHEK AVIVA) test strip Testing twice daily 100 each 0  . HORIZANT 600 MG TBCR     . HYDROcodone-acetaminophen (NORCO/VICODIN) 5-325 MG tablet Take 1 tablet by mouth at bedtime. 30 tablet 0  . Insulin Pen Needle (BD PEN NEEDLE NANO 2ND GEN) 32G X 4 MM MISC 1 Package by Does not apply route 2 (two) times daily. 100 each 0  . liraglutide (VICTOZA) 18 MG/3ML SOPN DIAL AND  INJECT SUBCUTANEOUSLY 1.8MG DAILY EVERY MORNING 3 pen 0  . losartan (COZAAR) 100 MG tablet Take 1 tablet (100 mg total) by mouth daily. -- Office visit needed for further refills 90 tablet 0  . metFORMIN (GLUCOPHAGE) 500 MG tablet TAKE 1 TABLET BY MOUTH TWICE A DAY WITH A MEAL. 60 tablet 0  . pramipexole (MIRAPEX) 0.25 MG tablet Take 0.5 mg by mouth 2 (two) times daily. 1630  And  Bedtime    . pregabalin (LYRICA) 75 MG capsule TAKE ONE CAPSULE BY MOUTH EVERY AFTERNOON AND TAKE TWO CAPSULES BY MOUTH EVERY NIGHT AT BEDTIME 270 capsule 0  . simvastatin (ZOCOR) 40 MG tablet Take 1 tablet (40 mg total) by mouth at bedtime. -- Office visit needed for further refills 90 tablet 0  . sulfamethoxazole-trimethoprim (BACTRIM DS) 800-160 MG tablet Take 1 tablet by mouth 2 (two) times daily. 20 tablet 0  . traMADol (ULTRAM) 50 MG tablet Take 1 tablet (50 mg total) by mouth every 6 (six) hours as needed. 30 tablet 0  . Vitamin D, Ergocalciferol, (DRISDOL) 1.25 MG (50000 UT) CAPS capsule Take 1 capsule (50,000 Units total) by mouth every 7 (seven) days. 4 capsule 0  . zolpidem (AMBIEN) 5 MG tablet Take 1/2 tab po QHS PRN 10 tablet 0   No current facility-administered medications on file prior to visit.     Past Medical History:  Diagnosis Date  . Allergic rhinitis   . Apnea   . Arthritis   . Asthma    very mild  . Back pain   . Bilateral lower extremity edema   . Carotid artery disease ([place])    a. Carotid duplex 03/2016 - duplex was stable, 54-65% RICA, 6-81% LICA  . Complication of anesthesia    post-op delirium  . Endometrial polyp   . Essential hypertension   . GERD (gastroesophageal reflux disease)   . Heart murmur    per pt  . History of adenomatous polyp of colon    04-24-2016  tubular adenoma  . History of squamous cell carcinoma excision    pilonidal area second excision 02-02-2002  . HTN (hypertension)   . Hyperlipidemia   . Leg pain   . OAB (overactive bladder)   . RA (rheumatoid  arthritis) ([place])   . RLS (restless legs syndrome)   . Shortness of breath on exertion   . SUI (stress urinary incontinence, female)   . Venous insufficiency    legs  . Vitamin D deficiency     Past Surgical History:  Procedure Laterality Date  . APPENDECTOMY  07-02-2005   [name]   open  . CARDIAC CATHETERIZATION  01-23-2009  dr Darnell Level brodie   normal coronary arteries and lvf  . CARDIOVASCULAR STRESS TEST  03/31/2016   Low risk nuclear study w/ medium  defect of mild severity in basal anterior and mid anterior location w/ no evidence ischemia or infarction/  normal LV function and wall motion,  nuclear stress ef 71%  . CATARACT EXTRACTION W/ INTRAOCULAR LENS IMPLANT Left 09/2016  . COLONOSCOPY  last one 04-24-2016  . HYSTEROSCOPY W/D&C N/A 12/22/2016   Procedure: DILATATION AND CURETTAGE /HYSTEROSCOPY;  Surgeon: Dian Queen, MD;  Location: [facility];  Service: Gynecology;  Laterality: N/A;  . IR US GUIDANCE  09/08/2016  . POSTERIOR LUMBAR FUSION  02/ 2018    [facility] ([place], [place])  . SKIN CANCER EXCISION    . TONSILLECTOMY  child  . 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 TRANSTHORACIC ECHOCARDIOGRAM  01/21/2009   mild LVH, ef 94-17%, grade 1 diastolic dysfunction  . ULNAR NERVE TRANSPOSITION Left 12-20-2008  dr sypher   decompression and partial resection medial triceps fascia  . WIDE LOCAL EXCISION PILONIDAL AREA  02-02-2002  [name]   recurrent squamous cell carcinoma in situ    Social History   Socioeconomic History  . Marital status: Married    Spouse name: Clare Gandy  . Number of children: 2  . Years of education: Not on file  . Highest education level: Not on file  Occupational History  . Occupation: retired Tour manager  . Financial resource strain: Not hard at all  . Food insecurity     Worry: Never true    Inability: Never true  . Transportation needs    Medical: No    Non-medical: No  Tobacco Use  . Smoking status: Former Smoker    Packs/day: 0.50    Years: 10.00    Pack years: 5.00    Types: Cigarettes    Quit date: 12/10/1973    Years since quitting: 45.0  . Smokeless tobacco: Never Used  Substance and Sexual Activity  . Alcohol use: Yes    Comment: occasional  . Drug use: No  . Sexual activity: Not on file  Lifestyle  . Physical activity    Days per week: 3 days    Minutes per session: 40 min  . Stress: Only a little  Relationships  . Social connections    Talks on phone: More than three times a week    Gets together: More than three times a week    Attends religious service: Not on file    Active member of club or organization: Not on file    Attends meetings of clubs or organizations: Not on file    Relationship status: Married  Other Topics Concern  . Not on file  Social History Narrative   2 children ages 53,35    Family History  Problem Relation Age of Onset  . Esophageal cancer Father   . Heart disease Father        Unknown what kind  . Hyperlipidemia Father   . Hypertension Father   . Cancer Father   . Heart failure Father   . Hyperlipidemia Mother   . Hypertension Mother   . CAD Sister        5 years younger than patient - had stent. Was told that her anorexia/bulimia may have played a role  . Stroke Maternal Grandmother 66  . Diabetes Maternal Grandmother   . Heart attack Maternal Grandfather 93  . COPD Maternal Grandfather   . Heart attack Paternal  Grandmother        68s  . Diabetes Other        MGGM    Review of Systems  Constitutional: Negative for chills and fever.  Gastrointestinal:       No change in bowel control  Genitourinary:       No change in bladder control  Musculoskeletal: Positive for gait problem (lack of balance).  Neurological: Positive for dizziness, tremors (sometimes in the morning), weakness  (legs), light-headedness and numbness. Negative for headaches.       Objective:   Vitals:   01/02/19 1420  BP: (!) 146/68  Pulse: 97  Resp: 16  Temp: 98.6 F (37 C)  SpO2: 99%   BP Readings from Last 3 Encounters:  01/02/19 (!) 146/68  12/29/18 (!) 160/100  12/13/18 106/67   Wt Readings from Last 3 Encounters:  01/02/19 205 lb 6.4 oz (93.2 kg)  12/29/18 203 lb (92.1 kg)  12/13/18 198 lb (89.8 kg)   Body mass index is 33.15 kg/m.   Physical Exam Constitutional:      General: She is not in acute distress.    Appearance: Normal appearance. She is not ill-appearing.  HENT:     Head: Normocephalic and atraumatic.  Musculoskeletal:        General: No tenderness (No lower back tenderness).     Right lower leg: No edema.     Left lower leg: No edema.  Skin:    General: Skin is warm and dry.  Neurological:     General: No focal deficit present.     Mental Status: She is alert and oriented to person, place, and time.     Cranial Nerves: No cranial nerve deficit.     Sensory: No sensory deficit.     Motor: Weakness (Mild bilateral lower extremities) present.     Gait: Gait abnormal (Unsteady, cautious gait).  Psychiatric:        Mood and Affect: Mood normal.        Behavior: Behavior normal.     Comments: Appears slightly drowsy-?  Medication effect versus lack of sleep which is chronic            Assessment & Plan:    See Problem List for Assessment and Plan of chronic medical problems.

## 2019-01-02 ENCOUNTER — Encounter: Payer: Self-pay | Admitting: Internal Medicine

## 2019-01-02 ENCOUNTER — Ambulatory Visit (INDEPENDENT_AMBULATORY_CARE_PROVIDER_SITE_OTHER): Payer: Medicare Other | Admitting: Internal Medicine

## 2019-01-02 ENCOUNTER — Other Ambulatory Visit (INDEPENDENT_AMBULATORY_CARE_PROVIDER_SITE_OTHER): Payer: Self-pay | Admitting: Family Medicine

## 2019-01-02 ENCOUNTER — Other Ambulatory Visit: Payer: Self-pay

## 2019-01-02 VITALS — BP 146/68 | HR 97 | Temp 98.6°F | Resp 16 | Ht 66.0 in | Wt 205.4 lb

## 2019-01-02 DIAGNOSIS — R296 Repeated falls: Secondary | ICD-10-CM | POA: Insufficient documentation

## 2019-01-02 DIAGNOSIS — R29898 Other symptoms and signs involving the musculoskeletal system: Secondary | ICD-10-CM

## 2019-01-02 DIAGNOSIS — E559 Vitamin D deficiency, unspecified: Secondary | ICD-10-CM

## 2019-01-02 DIAGNOSIS — E119 Type 2 diabetes mellitus without complications: Secondary | ICD-10-CM

## 2019-01-02 MED ORDER — AMITRIPTYLINE HCL 50 MG PO TABS: 100.0000 mg | ORAL_TABLET | Freq: Every day | ORAL | Status: DC

## 2019-01-02 NOTE — Assessment & Plan Note (Signed)
Frequent falls related to bilateral lower extremity weakness and possibly medications Plan as above

## 2019-01-02 NOTE — Assessment & Plan Note (Signed)
She complains of weakness of both lower extremities and then giving out on her resulting in falls ?  Orthopedic versus neurological, versus medication related She has chronic left knee pain, but this has not changed.  She denies any back issues She has some neuropathy, RLS and is on multiple medications for this.  I am concerned that some of her symptoms are from combination of medications she is on We will refer to physical therapy to help improve her balance and leg strength Advised that she follow-up with her neurologist

## 2019-01-02 NOTE — Patient Instructions (Addendum)
  A referral was ordered for PT - [facility].    Follow up with neurology again.

## 2019-01-03 ENCOUNTER — Encounter: Payer: Self-pay | Admitting: Internal Medicine

## 2019-01-04 ENCOUNTER — Other Ambulatory Visit: Payer: Self-pay

## 2019-01-04 ENCOUNTER — Telehealth (INDEPENDENT_AMBULATORY_CARE_PROVIDER_SITE_OTHER): Payer: Medicare Other | Admitting: Family Medicine

## 2019-01-04 ENCOUNTER — Encounter (INDEPENDENT_AMBULATORY_CARE_PROVIDER_SITE_OTHER): Payer: Self-pay | Admitting: Family Medicine

## 2019-01-04 DIAGNOSIS — R809 Proteinuria, unspecified: Secondary | ICD-10-CM | POA: Diagnosis not present

## 2019-01-04 DIAGNOSIS — E1129 Type 2 diabetes mellitus with other diabetic kidney complication: Secondary | ICD-10-CM

## 2019-01-04 DIAGNOSIS — E559 Vitamin D deficiency, unspecified: Secondary | ICD-10-CM | POA: Diagnosis not present

## 2019-01-04 DIAGNOSIS — E669 Obesity, unspecified: Secondary | ICD-10-CM

## 2019-01-04 DIAGNOSIS — Z6832 Body mass index (BMI) 32.0-32.9, adult: Secondary | ICD-10-CM

## 2019-01-04 MED ORDER — VITAMIN D (ERGOCALCIFEROL) 1.25 MG (50000 UNIT) PO CAPS: 50000.0000 [IU] | ORAL_CAPSULE | ORAL | 0 refills | Status: DC

## 2019-01-04 MED ORDER — METFORMIN HCL 500 MG PO TABS: ORAL_TABLET | ORAL | 0 refills | Status: DC

## 2019-01-05 NOTE — Progress Notes (Signed)
Office: [phone]  /  Fax: [phone] TeleHealth Visit:  Erin Roach has verbally consented to this TeleHealth visit today. The patient is located at home, the provider is located at the [facility] office. The participants in this visit include the listed provider and patient. The visit was conducted today via face time.  HPI:   Chief Complaint: OBESITY Erin Roach is here to discuss her progress with her obesity treatment plan. She is on the keep a food journal with 1500 calories and 85+ grams of protein daily and is following her eating plan approximately 50 % of the time. She states she is riding the stationary bike, walking, and gardening for 20-60 minutes 3 times per week. Erin Roach feels she has done well maintaining her weight, but hasn't been journaling much. She has been dealing with feeling dizzy and frequent falls, and has been more pre-occupied and not concentrating on weight loss.  We were unable to weigh the patient today for this TeleHealth visit. She feels as if she has maintained her weight since her last visit. She has lost 25 lbs since starting treatment with [name].  Diabetes II Erin Roach has a diagnosis of diabetes type II. Erin Roach is stable on medications. She states fasting BGs mostly range in 120's. She denies any hypoglycemia which could contribute to falls. Last A1c was 5.4. She has been working on intensive lifestyle modifications including diet, exercise, and weight loss to help control her blood glucose levels.  Vitamin D Deficiency Erin Roach has a diagnosis of vitamin D deficiency. She is stable on prescription Vit D, last level at goal. She denies nausea, vomiting or muscle weakness.  ASSESSMENT AND PLAN:  Vitamin D deficiency - Plan: Vitamin D, Ergocalciferol, (DRISDOL) 1.25 MG (50000 UT) CAPS capsule  Type 2 diabetes mellitus with microalbuminuria, without long-term current use of insulin (HCC) - Plan: metFORMIN (GLUCOPHAGE) 500 MG tablet  Class 1 obesity  with serious comorbidity and body mass index (BMI) of 32.0 to 32.9 in adult, unspecified obesity type  PLAN:  Diabetes II Erin Roach has been given extensive diabetes education by myself today including ideal fasting and post-prandial blood glucose readings, individual ideal Hgb A1c goals and hypoglycemia prevention. We discussed the importance of good blood sugar control to decrease the likelihood of diabetic complications such as nephropathy, neuropathy, limb loss, blindness, coronary artery disease, and death. We discussed the importance of intensive lifestyle modification including diet, exercise and weight loss as the first line treatment for diabetes. Eliot agrees to continue Victoza, and she agrees to continue taking metformin 500 mg BID #60 and we will refill for 1 month. Ta agrees to follow up with our clinic in 3 weeks.  Vitamin D Deficiency Erin Roach was informed that low vitamin D levels contributes to fatigue and are associated with obesity, breast, and colon cancer. Erin Roach agrees to continue taking prescription Vit D 50,000 IU every week #4 and we will refill for 1 month. She will follow up for routine testing of vitamin D, at least 2-3 times per year. She was informed of the risk of over-replacement of vitamin D and agrees to not increase her dose unless she discusses this with [name] first. Erin Roach agrees to follow up with our clinic in 3 weeks.  Obesity Erin Roach is currently in the action stage of change. As such, her goal is to maintain weight for now  Erin Roach's goal is to maintain her weight while she deals with getting her balance and falls evaluated. She has agreed to keep  a food journal with 1500 calories and 85+ grams of protein daily Erin Roach has been instructed to work up to a goal of 150 minutes of combined cardio and strengthening exercise per week for weight loss and overall health benefits. We discussed the following Behavioral Modification Strategies today: increasing lean protein  intake   Erin Roach has agreed to follow up with our clinic in 3 weeks. She was informed of the importance of frequent follow up visits to maximize her success with intensive lifestyle modifications for her multiple health conditions.  ALLERGIES: No Known Allergies  MEDICATIONS: Current Outpatient Medications on File Prior to Visit  Medication Sig Dispense Refill  . albuterol (PROAIR HFA) 108 (90 Base) MCG/ACT inhaler Inhale 2 puffs into the lungs every 6 (six) hours as needed for wheezing or shortness of breath. 1 Inhaler 5  . amitriptyline (ELAVIL) 50 MG tablet Take 2 tablets (100 mg total) by mouth at bedtime.    . Blood Glucose Monitoring Suppl (ONE TOUCH ULTRA 2) w/Device KIT USE TO TEST BLOOD SUGARS UP TO 4 TIMES DAILY 1 each 0  . budesonide-formoterol (SYMBICORT) 160-4.5 MCG/ACT inhaler Inhale 2 puffs into the lungs daily. 1 Inhaler 5  . Cholecalciferol (VITAMIN D) 2000 units tablet Take 1 tablet (2,000 Units total) by mouth daily.    . fluticasone (FLONASE) 50 MCG/ACT nasal spray Place 1 spray into both nostrils daily. 16 g 0  . furosemide (LASIX) 20 MG tablet TAKE ONE TABLET BY MOUTH DAILY 90 tablet 1  . glucose blood (ACCU-CHEK AVIVA) test strip Testing twice daily 100 each 0  . HORIZANT 600 MG TBCR     . HYDROcodone-acetaminophen (NORCO/VICODIN) 5-325 MG tablet Take 1 tablet by mouth at bedtime. 30 tablet 0  . Insulin Pen Needle (BD PEN NEEDLE NANO 2ND GEN) 32G X 4 MM MISC 1 Package by Does not apply route 2 (two) times daily. 100 each 0  . liraglutide (VICTOZA) 18 MG/3ML SOPN DIAL AND INJECT SUBCUTANEOUSLY 1.8MG DAILY EVERY MORNING 3 pen 0  . losartan (COZAAR) 100 MG tablet Take 1 tablet (100 mg total) by mouth daily. -- Office visit needed for further refills 90 tablet 0  . pramipexole (MIRAPEX) 0.25 MG tablet Take 0.5 mg by mouth 2 (two) times daily. 1630  And  Bedtime    . pregabalin (LYRICA) 75 MG capsule TAKE ONE CAPSULE BY MOUTH EVERY AFTERNOON AND TAKE TWO CAPSULES BY MOUTH  EVERY NIGHT AT BEDTIME 270 capsule 0  . simvastatin (ZOCOR) 40 MG tablet Take 1 tablet (40 mg total) by mouth at bedtime. -- Office visit needed for further refills 90 tablet 0  . sulfamethoxazole-trimethoprim (BACTRIM DS) 800-160 MG tablet Take 1 tablet by mouth 2 (two) times daily. 20 tablet 0  . traMADol (ULTRAM) 50 MG tablet Take 1 tablet (50 mg total) by mouth every 6 (six) hours as needed. 30 tablet 0   No current facility-administered medications on file prior to visit.     PAST MEDICAL HISTORY: Past Medical History:  Diagnosis Date  . Allergic rhinitis   . Apnea   . Arthritis   . Asthma    very mild  . Back pain   . Bilateral lower extremity edema   . Carotid artery disease ([place])    a. Carotid duplex 03/2016 - duplex was stable, 29-52% RICA, 8-41% LICA  . Complication of anesthesia    post-op delirium  . Endometrial polyp   . Essential hypertension   . GERD (gastroesophageal reflux disease)   . Heart  murmur    per pt  . History of adenomatous polyp of colon    04-24-2016  tubular adenoma  . History of squamous cell carcinoma excision    pilonidal area second excision 02-02-2002  . HTN (hypertension)   . Hyperlipidemia   . Leg pain   . OAB (overactive bladder)   . RA (rheumatoid arthritis) ([place])   . RLS (restless legs syndrome)   . Shortness of breath on exertion   . SUI (stress urinary incontinence, female)   . Venous insufficiency    legs  . Vitamin D deficiency     PAST SURGICAL HISTORY: Past Surgical History:  Procedure Laterality Date  . APPENDECTOMY  07-02-2005   [name]   open  . CARDIAC CATHETERIZATION  01-23-2009  dr Darnell Level brodie   normal coronary arteries and lvf  . CARDIOVASCULAR STRESS TEST  03/31/2016   Low risk nuclear study w/ medium defect of mild severity in basal anterior and mid anterior location w/ no evidence ischemia or infarction/  normal LV function and wall motion,  nuclear stress ef 71%  . CATARACT EXTRACTION W/ INTRAOCULAR  LENS IMPLANT Left 09/2016  . COLONOSCOPY  last one 04-24-2016  . HYSTEROSCOPY W/D&C N/A 12/22/2016   Procedure: DILATATION AND CURETTAGE /HYSTEROSCOPY;  Surgeon: Dian Queen, MD;  Location: [facility];  Service: Gynecology;  Laterality: N/A;  . IR US GUIDANCE  09/08/2016  . POSTERIOR LUMBAR FUSION  02/ 2018    [facility] ([place])  . SKIN CANCER EXCISION    . TONSILLECTOMY  child  . 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 TRANSTHORACIC ECHOCARDIOGRAM  01/21/2009   mild LVH, ef 38-45%, grade 1 diastolic dysfunction  . ULNAR NERVE TRANSPOSITION Left 12-20-2008  dr sypher   decompression and partial resection medial triceps fascia  . WIDE LOCAL EXCISION PILONIDAL AREA  02-02-2002  [name]   recurrent squamous cell carcinoma in situ    SOCIAL HISTORY: Social History   Tobacco Use  . Smoking status: Former Smoker    Packs/day: 0.50    Years: 10.00    Pack years: 5.00    Types: Cigarettes    Quit date: 12/10/1973    Years since quitting: 45.1  . Smokeless tobacco: Never Used  Substance Use Topics  . Alcohol use: Yes    Comment: occasional  . Drug use: No    FAMILY HISTORY: Family History  Problem Relation Age of Onset  . Esophageal cancer Father   . Heart disease Father        Unknown what kind  . Hyperlipidemia Father   . Hypertension Father   . Cancer Father   . Heart failure Father   . Hyperlipidemia Mother   . Hypertension Mother   . CAD Sister        72 years younger than patient - had stent. Was told that her anorexia/bulimia may have played a role  . Stroke Maternal Grandmother 66  . Diabetes Maternal Grandmother   . Heart attack Maternal Grandfather 93  . COPD Maternal Grandfather   . Heart attack Paternal Grandmother        90s  . Diabetes Other        MGGM    ROS: Review of Systems   Constitutional: Negative for weight loss.  Gastrointestinal: Negative for nausea and vomiting.  Musculoskeletal: Positive for falls.  Negative muscle weakness  Neurological: Positive for dizziness.  Endo/Heme/Allergies:       Negative hypoglycemia    PHYSICAL EXAM: Pt in no acute distress  RECENT LABS AND TESTS: BMET    Component Value Date/Time   NA 137 12/13/2018 0935   K 4.2 12/13/2018 0935   CL 100 12/13/2018 0935   CO2 23 12/13/2018 0935   GLUCOSE 87 12/13/2018 0935   GLUCOSE 147 (H) 12/08/2016 1436   BUN 21 12/13/2018 0935   CREATININE 0.99 12/13/2018 0935   CALCIUM 9.0 12/13/2018 0935   GFRNONAA 56 (L) 12/13/2018 0935   GFRAA 64 12/13/2018 0935   Lab Results  Component Value Date   HGBA1C 5.4 12/13/2018   HGBA1C 5.7 (H) 04/20/2018   HGBA1C 5.3 01/06/2018   HGBA1C 5.4 09/28/2017   HGBA1C 5.5 06/09/2017   Lab Results  Component Value Date   INSULIN 13.3 12/13/2018   INSULIN 27.9 (H) 04/20/2018   INSULIN 24.6 01/06/2018   INSULIN 17.0 09/28/2017   INSULIN 28.3 (H) 06/09/2017   CBC    Component Value Date/Time   WBC 6.2 09/28/2017 0906   WBC 6.8 12/15/2016 1159   RBC 4.07 09/28/2017 0906   RBC 4.23 12/15/2016 1159   HGB 11.8 09/28/2017 0906   HCT 35.4 09/28/2017 0906   PLT 242 12/15/2016 1159   MCV 87 09/28/2017 0906   MCH 29.0 09/28/2017 0906   MCH 30.3 12/15/2016 1159   MCHC 33.3 09/28/2017 0906   MCHC 34.8 12/15/2016 1159   RDW 14.3 09/28/2017 0906   LYMPHSABS 1.5 09/28/2017 0906   MONOABS 0.5 06/04/2016 0808   EOSABS 0.2 09/28/2017 0906   BASOSABS 0.0 09/28/2017 0906   Iron/TIBC/Ferritin/ %Sat    Component Value Date/Time   IRON 109 03/02/2017 1035   TIBC 333 03/19/2015 1448   FERRITIN 218.0 03/02/2017 1035   IRONPCTSAT 15 03/19/2015 1448   Lipid Panel     Component Value Date/Time   CHOL 190 12/13/2018 0935   TRIG 160 (H) 12/13/2018 0935   TRIG 101 05/19/2006 0832   HDL 54 12/13/2018 0935   CHOLHDL 4 06/04/2016 0808    VLDL 31.4 06/04/2016 0808   LDLCALC 104 (H) 12/13/2018 0935   LDLDIRECT 141.3 09/06/2012 0907   Hepatic Function Panel     Component Value Date/Time   PROT 6.9 12/13/2018 0935   ALBUMIN 4.4 12/13/2018 0935   AST 31 12/13/2018 0935   ALT 38 (H) 12/13/2018 0935   ALKPHOS 77 12/13/2018 0935   BILITOT 0.4 12/13/2018 0935   BILIDIR 0.1 12/04/2013 1724      Component Value Date/Time   TSH 5.130 (H) 12/13/2018 0935   TSH 4.050 09/28/2017 0906   TSH 4.810 (H) 02/23/2017 1007      I, Trixie Dredge, am acting as Location manager for Dennard Nip, MD I have reviewed the above documentation for accuracy and completeness, and I agree with the above. -Dennard Nip, MD

## 2019-01-06 ENCOUNTER — Other Ambulatory Visit: Payer: Self-pay | Admitting: Internal Medicine

## 2019-01-06 MED ORDER — HYDROCODONE-ACETAMINOPHEN 5-325 MG PO TABS: 1.0000 | ORAL_TABLET | Freq: Every day | ORAL | 0 refills | Status: DC

## 2019-01-06 NOTE — Telephone Encounter (Signed)
Copied from [place] [phone]. Topic: Quick Communication - Rx Refill/Question >> Jan 06, 2019  8:20 AM Leward Quan A wrote: Medication: HYDROcodone-acetaminophen (NORCO/VICODIN) 5-325 MG tablet  Has the patient contacted their pharmacy? Yes.   (Agent: If no, request that the patient contact the pharmacy for the refill.) (Agent: If yes, when and what did the pharmacy advise?)  Preferred Pharmacy (with phone number or street name): [id number] - [place], [phone] (Phone) [phone] (Fax)    Agent: Please be advised that RX refills may take up to 3 business days. We ask that you follow-up with your pharmacy.

## 2019-01-06 NOTE — Telephone Encounter (Signed)
Requested medication (s) are due for refill today: yes  Requested medication (s) are on the active medication list: yes  Last refill: 12/09/2018  Future visit scheduled: no  Notes to clinic:  This refill cannot be delegated   Requested Prescriptions  Pending Prescriptions Disp Refills   HYDROcodone-acetaminophen (NORCO/VICODIN) 5-325 MG tablet 30 tablet 0    Sig: Take 1 tablet by mouth at bedtime.     Not Delegated - Analgesics:  Opioid Agonist Combinations Failed - 01/06/2019  8:23 AM      Failed - This refill cannot be delegated      Failed - Urine Drug Screen completed in last 360 days.      Passed - Valid encounter within last 6 months    Recent Outpatient Visits          4 days ago Weakness of both lower extremities   [place], Claudina Lick, MD   1 week ago Type 2 diabetes mellitus without complication, without long-term current use of insulin [facility])   [facility] -Georges Mouse, MD   6 months ago Essential hypertension   [place], Claudina Lick, MD   11 months ago Viral URI with cough   [place], Delphia Grates, NP   1 year ago Essential hypertension   [place], Claudina Lick, MD

## 2019-01-06 NOTE — Telephone Encounter (Signed)
Routing to CMA 

## 2019-01-08 ENCOUNTER — Other Ambulatory Visit (INDEPENDENT_AMBULATORY_CARE_PROVIDER_SITE_OTHER): Payer: Self-pay | Admitting: Family Medicine

## 2019-01-08 DIAGNOSIS — E1129 Type 2 diabetes mellitus with other diabetic kidney complication: Secondary | ICD-10-CM

## 2019-01-08 DIAGNOSIS — R809 Proteinuria, unspecified: Secondary | ICD-10-CM

## 2019-01-10 ENCOUNTER — Other Ambulatory Visit: Payer: Self-pay

## 2019-01-10 ENCOUNTER — Encounter: Payer: Self-pay | Admitting: Physical Therapy

## 2019-01-10 ENCOUNTER — Ambulatory Visit: Payer: Medicare Other | Attending: Internal Medicine | Admitting: Physical Therapy

## 2019-01-10 DIAGNOSIS — M62838 Other muscle spasm: Secondary | ICD-10-CM | POA: Insufficient documentation

## 2019-01-10 DIAGNOSIS — M25562 Pain in left knee: Secondary | ICD-10-CM | POA: Diagnosis present

## 2019-01-10 DIAGNOSIS — R296 Repeated falls: Secondary | ICD-10-CM | POA: Diagnosis present

## 2019-01-10 DIAGNOSIS — R262 Difficulty in walking, not elsewhere classified: Secondary | ICD-10-CM

## 2019-01-10 DIAGNOSIS — G8929 Other chronic pain: Secondary | ICD-10-CM | POA: Diagnosis present

## 2019-01-10 DIAGNOSIS — R279 Unspecified lack of coordination: Secondary | ICD-10-CM | POA: Diagnosis present

## 2019-01-10 DIAGNOSIS — M6281 Muscle weakness (generalized): Secondary | ICD-10-CM | POA: Diagnosis present

## 2019-01-10 NOTE — Therapy (Signed)
[facility] [address] [place], [place], [address] Phone: [phone]   Fax:  [phone]  Physical Therapy Evaluation  Patient Details  Name: Erin Roach MRN: [id number] Date of Birth: [birth year] Referring Provider (PT): Dr. Billey Gosling   Encounter Date: 01/10/2019  PT End of Session - 01/10/19 2004    Visit Number  1    Date for PT Re-Evaluation  03/07/19    Authorization Type  UHC Medicare    PT Start Time  1215    PT Stop Time  1300    PT Time Calculation (min)  45 min    Activity Tolerance  Patient tolerated treatment well       Past Medical History:  Diagnosis Date  . Allergic rhinitis   . Apnea   . Arthritis   . Asthma    very mild  . Back pain   . Bilateral lower extremity edema   . Carotid artery disease ([place])    a. Carotid duplex 03/2016 - duplex was stable, 123456 RICA, 123456 LICA  . Complication of anesthesia    post-op delirium  . Endometrial polyp   . Essential hypertension   . GERD (gastroesophageal reflux disease)   . Heart murmur    per pt  . History of adenomatous polyp of colon    04-24-2016  tubular adenoma  . History of squamous cell carcinoma excision    pilonidal area second excision 02-02-2002  . HTN (hypertension)   . Hyperlipidemia   . Leg pain   . OAB (overactive bladder)   . RA (rheumatoid arthritis) ([place])   . RLS (restless legs syndrome)   . Shortness of breath on exertion   . SUI (stress urinary incontinence, female)   . Venous insufficiency    legs  . Vitamin D deficiency     Past Surgical History:  Procedure Laterality Date  . APPENDECTOMY  07-02-2005   [name]   open  . CARDIAC CATHETERIZATION  01-23-2009  dr Darnell Level brodie   normal coronary arteries and lvf  . CARDIOVASCULAR STRESS TEST  03/31/2016   Low risk nuclear study w/ medium defect of mild severity in basal anterior and mid anterior location w/ no evidence ischemia or infarction/  normal  LV function and wall motion,  nuclear stress ef 71%  . CATARACT EXTRACTION W/ INTRAOCULAR LENS IMPLANT Left 09/2016  . COLONOSCOPY  last one 04-24-2016  . HYSTEROSCOPY W/D&C N/A 12/22/2016   Procedure: DILATATION AND CURETTAGE /HYSTEROSCOPY;  Surgeon: Dian Queen, MD;  Location: [facility];  Service: Gynecology;  Laterality: N/A;  . IR US GUIDANCE  09/08/2016  . POSTERIOR LUMBAR FUSION  02/ 2018    [facility] ([place])  . SKIN CANCER EXCISION    . TONSILLECTOMY  child  . 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 TRANSTHORACIC ECHOCARDIOGRAM  01/21/2009   mild LVH, ef Q000111Q, grade 1 diastolic dysfunction  . ULNAR NERVE TRANSPOSITION Left 12-20-2008  dr sypher   decompression and partial resection medial triceps fascia  . WIDE LOCAL EXCISION PILONIDAL AREA  02-02-2002  [name]   recurrent squamous cell carcinoma in situ    There were no vitals filed for this visit.   Subjective Assessment - 01/10/19 1222    Subjective  Difficulty balancing and not able to walk as far.  Got back  from [place] right before Covid and has been sedentary ever since.  History of knee give way causing fall;  I get light headed about once a week.    Pertinent History  "Kajal";  left TKR;  lumbar fusion; restless legs, HTN; diabetes;  mild neuropathy;  osteopenia    Limitations  House hold activities    How long can you walk comfortably?  push grocery cart 30 minutes holding on;  not holding on 15 minutes    Diagnostic tests  none    Patient Stated Goals  not to have those spells happen and be more active again    Currently in Pain?  No/denies         [facility] PT Assessment - 01/10/19 0001      Assessment   Medical Diagnosis  weakness LEs; falls    Referring Provider (PT)  Dr. Billey Gosling    Onset Date/Surgical Date  08/09/18    Next MD Visit   as needed     Prior Therapy  in past for TKR;  fusion       Precautions   Precautions  Fall      Restrictions   Weight Bearing Restrictions  No      Balance Screen   Has the patient fallen in the past 6 months  Yes    How many times?  3     Has the patient had a decrease in activity level because of a fear of falling?   Yes    Is the patient reluctant to leave their home because of a fear of falling?   No      Home Environment   Living Environment  Private residence    Living Arrangements  Spouse/significant other    Type of [place] to enter    Home Layout  Two level;Able to live on main level with bedroom/bathroom      Prior Function   Level of Independence  Independent    Vocation  Retired    Leisure  read books ; visit friends, family       Observation/Other Assessments   Activities of Balance Confidence Scale (ABC Scale)   65% level of confidence      AROM   Overall AROM Comments  Bil UEs, LEs and spinal ROM WNLS       Strength   Overall Strength Comments  able to rise from a chair without UE use;  able to heel rise     Right Hip Flexion  4+/5    Right Hip ABduction  4/5    Left Hip Flexion  4+/5    Left Hip ABduction  4/5    Right Knee Flexion  4+/5    Right Knee Extension  4+/5    Left Knee Flexion  4+/5    Left Knee Extension  4+/5    Right Ankle Dorsiflexion  4/5    Right Ankle Plantar Flexion  4/5    Left Ankle Dorsiflexion  4/5    Left Ankle Plantar Flexion  4/5      Special Tests   Other special tests  SLS right/left < 3 sec       Standardized Balance Assessment   Five times sit to stand comments   11 sec    10 Meter Walk  9.37      Berg Balance Test   Sit to Stand  Able to stand without using hands and stabilize  independently    Standing Unsupported  Able to stand safely 2 minutes    Sitting with Back Unsupported but Feet Supported on Floor or Stool  Able to sit safely and securely 2 minutes    Stand to Sit  Sits safely  with minimal use of hands    Transfers  Able to transfer safely, minor use of hands    Standing Unsupported with Eyes Closed  Able to stand 10 seconds with supervision    Standing Unsupported with Feet Together  Able to place feet together independently and stand for 1 minute with supervision    From Standing, Reach Forward with Outstretched Arm  Can reach forward >12 cm safely (5")    From Standing Position, Pick up Object from [place] to pick up shoe safely and easily    From Standing Position, Turn to Look Behind Over each Shoulder  Looks behind one side only/other side shows less weight shift    Turn 360 Degrees  Able to turn 360 degrees safely in 4 seconds or less    Standing Unsupported, Alternately Place Feet on Step/Stool  Able to stand independently and safely and complete 8 steps in 20 seconds    Standing Unsupported, One Foot in [employer] balance while stepping or standing    Standing on One Leg  Tries to lift leg/unable to hold 3 seconds but remains standing independently    Total Score  45      Dynamic Gait Index   Level Surface  Normal    Change in Gait Speed  Mild Impairment    Gait with Horizontal Head Turns  Mild Impairment    Gait with Vertical Head Turns  Mild Impairment    Gait and Pivot Turn  Mild Impairment    Step Over Obstacle  Normal    Step Around Obstacles  Normal    Steps  Mild Impairment    Total Score  19      Timed Up and Go Test   Normal TUG (seconds)  11.1                Objective measurements completed on examination: See above findings.              PT Education - 01/10/19 2004    Education Details  Access Code: [email or web address]  sit to stand;  seated heel raises; seated push down for abdominal activation    Person(s) Educated  Patient    Methods  Explanation;Demonstration;Handout    Comprehension  Returned demonstration;Verbalized understanding       PT Short Term Goals - 01/10/19 2018      PT SHORT TERM GOAL #1   Title   independent with initial HEP for balance and strengthening    Time  4    Period  Weeks    Status  New    Target Date  02/07/19      PT SHORT TERM GOAL #2   Title  The patient will have improved BERG balance test to 48/56    Time  4    Period  Weeks    Status  New      PT SHORT TERM GOAL #3   Title  The patient will be able to walk her puppy for 15 minutes    Time  4    Period  Weeks    Status  New      PT SHORT TERM GOAL #4   Title  .Marland KitchenMarland Kitchen  PT Long Term Goals - 01/10/19 2020      PT LONG TERM GOAL #1   Title  independent with advanced HEP    Time  8    Period  Weeks    Status  New    Target Date  03/07/19      PT LONG TERM GOAL #2   Title  The patient will be able to take her puppy for a 20 min walk    Time  8    Period  Weeks    Status  New      PT LONG TERM GOAL #3   Title  BERG balance score improved to 50/56    Time  8    Period  Weeks    Status  New      PT LONG TERM GOAL #4   Title  Dynamic Gait Index improved to 21 indicating improved balance    Time  8    Period  Weeks    Status  New      PT LONG TERM GOAL #5   Title  Timed up and go improved to 10 sec or less    Time  8    Period  Weeks    Status  New      Additional Long Term Goals   Additional Long Term Goals  Yes      PT LONG TERM GOAL #6   Title  Bilateral LE strength grossly 4+/5 needed for improved standing and walking tolerance    Time  8    Period  Weeks    Status  New      PT LONG TERM GOAL #7   Title  Six minute walk test > 600 feet needed for community ambulation/shopping    Time  8    Period  Weeks    Status  New             Plan - 01/10/19 2006    Clinical Impression Statement  The patient reports the onset of loss of balance, light-headedness and LE/knee giveway since the end of March.  She reports 3 falls in the last few months and several times when her husband had to assist her to keep from falling.  She reports limited ambulation time to 10 minutes to walk  her puppy (with her husband close by)  but she can walk longer if pushing the shopping cart at the grocery store.  Her UE ROM is WFLS.  LE strength 4/5 in hip abductors and ankle plantar flexors and extensors, other LE muscles 4+/5.  Her ABC scale indicates only  65% confidence level in her balance.  BERG balance test is 45/56 indicating a moderate > 50% risk of falls.  Her balance is significantly impaired with narrow base of support or SLS.  Her single leg stance time is < 3 sec.  Dynamic gait index is impaired as well at 19.  Timed up and go 11.1 (minor impairment).  Minor reduction in gait speed with 10 m walk test 9.37 sec.  She would benefit from PT to address strength and dynamic balance deficits to decrease risk of falls.    Personal Factors and Comorbidities  Age;Past/Current Experience;Comorbidity 1;Comorbidity 2;Comorbidity 3+    Comorbidities  Lumbar fusion;  left TKR with poor result; HTN; diabetes; asthma; osteopenia; restless legs    Examination-Activity Limitations  Locomotion Level;Lift;Other    Examination-Participation Restrictions  Community Activity;Other;Shop    Stability/Clinical Decision Making  Evolving/Moderate complexity  Clinical Decision Making  Moderate    Rehab Potential  Good    PT Frequency  2x / week    PT Duration  8 weeks    PT Treatment/Interventions  ADLs/Self Care Home Management;Therapeutic exercise;Therapeutic activities;Balance training;Neuromuscular re-education;Patient/family education    PT Next Visit Plan  Nu-Step;  single leg standing;  balance narrow base of support;  unstable surface;  check 6 minute walk test;  step ups    PT Home Exercise Plan  Access Code: [email or web address]    Consulted and Agree with Plan of Care  Patient       Patient will benefit from skilled therapeutic intervention in order to improve the following deficits and impairments:  Difficulty walking, Decreased activity tolerance, Decreased balance, Decreased strength  Visit  Diagnosis: Difficulty in walking, not elsewhere classified - Plan: PT plan of care cert/re-cert  Unspecified lack of coordination - Plan: PT plan of care cert/re-cert  Repeated falls - Plan: PT plan of care cert/re-cert  Muscle weakness (generalized) - Plan: PT plan of care cert/re-cert     Problem List Patient Active Problem List   Diagnosis Date Noted  . Weakness of both lower extremities 01/02/2019  . Frequent falls 01/02/2019  . Cellulitis of right elbow 12/29/2018  . OSA on CPAP 06/15/2017  . Diabetes ([place]) 03/02/2017  . Herpes zoster without complication 123456  . Prediabetes 02/23/2017  . Shortness of breath on exertion 02/23/2017  . B12 nutritional deficiency 02/23/2017  . Vitamin D deficiency 12/08/2016  . Tubular adenoma of colon 04/29/2016  . Essential hypertension 09/12/2015  . Allergic rhinitis 07/17/2015  . Insomnia 07/17/2015  . Obese 04/10/2015  . S/P left TKA 04/09/2015  . Osteopenia 12/04/2012  . 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 RESTLESS LEG SYNDROME 02/11/2009  . LEG EDEMA, BILATERAL 02/11/2009  . Hyperlipidemia 10/30/2008  . Urinary incontinence 10/30/2008  . Skin cancer 10/30/2008  . Asthma 11/21/2007  . Venous (peripheral) insufficiency 11/22/2006   Ruben Im, PT 01/10/19 8:26 PM Phone: [phone] Fax: [phone] Alvera Singh 01/10/2019, 8:26 PM  [facility] [address] [place], [place], [address] Phone: [phone]   Fax:  [phone]  Name: Erin Roach MRN: [id number] Date of Birth: 1940/06/03

## 2019-01-10 NOTE — Patient Instructions (Signed)
Access Code: [email or web address]  URL: [email or web address]  Date: 01/10/2019  Prepared by: Ruben Im   Exercises  Sit to Stand - 10 reps - 1 sets - 2x daily - 7x weekly  Seated Transversus Abdominis Bracing - 10 reps - 1 sets - 5 hold - 3x daily - 7x weekly  Seated Heel Raise - 10 reps - 2 sets - 1x daily - 7x weekly

## 2019-01-11 ENCOUNTER — Other Ambulatory Visit (INDEPENDENT_AMBULATORY_CARE_PROVIDER_SITE_OTHER): Payer: Self-pay | Admitting: Family Medicine

## 2019-01-11 DIAGNOSIS — J3089 Other allergic rhinitis: Secondary | ICD-10-CM

## 2019-01-12 ENCOUNTER — Other Ambulatory Visit: Payer: Self-pay | Admitting: Internal Medicine

## 2019-01-12 ENCOUNTER — Encounter: Payer: Self-pay | Admitting: Physical Therapy

## 2019-01-12 ENCOUNTER — Other Ambulatory Visit: Payer: Self-pay

## 2019-01-12 ENCOUNTER — Ambulatory Visit: Payer: Medicare Other | Admitting: Physical Therapy

## 2019-01-12 DIAGNOSIS — R296 Repeated falls: Secondary | ICD-10-CM

## 2019-01-12 DIAGNOSIS — R262 Difficulty in walking, not elsewhere classified: Secondary | ICD-10-CM

## 2019-01-12 DIAGNOSIS — M6281 Muscle weakness (generalized): Secondary | ICD-10-CM

## 2019-01-12 DIAGNOSIS — G8929 Other chronic pain: Secondary | ICD-10-CM

## 2019-01-12 DIAGNOSIS — R279 Unspecified lack of coordination: Secondary | ICD-10-CM

## 2019-01-12 DIAGNOSIS — M62838 Other muscle spasm: Secondary | ICD-10-CM

## 2019-01-12 DIAGNOSIS — M25562 Pain in left knee: Secondary | ICD-10-CM

## 2019-01-12 NOTE — Therapy (Signed)
[facility] [address], [place], [place], [address] Phone: [phone]   Fax:  [phone]  Physical Therapy Treatment  Patient Details  Name: Erin Roach MRN: [id number] Date of Birth: [birth year] Referring Provider (PT): Dr. Billey Gosling   Encounter Date: 01/12/2019  PT End of Session - 01/12/19 1140    Visit Number  2    Date for PT Re-Evaluation  03/07/19    Authorization Type  UHC Medicare    PT Start Time  [id number]    PT Stop Time  1129    PT Time Calculation (min)  44 min    Activity Tolerance  Patient tolerated treatment well       Past Medical History:  Diagnosis Date  . Allergic rhinitis   . Apnea   . Arthritis   . Asthma    very mild  . Back pain   . Bilateral lower extremity edema   . Carotid artery disease ([place])    a. Carotid duplex 03/2016 - duplex was stable, 123456 RICA, 123456 LICA  . Complication of anesthesia    post-op delirium  . Endometrial polyp   . Essential hypertension   . GERD (gastroesophageal reflux disease)   . Heart murmur    per pt  . History of adenomatous polyp of colon    04-24-2016  tubular adenoma  . History of squamous cell carcinoma excision    pilonidal area second excision 02-02-2002  . HTN (hypertension)   . Hyperlipidemia   . Leg pain   . OAB (overactive bladder)   . RA (rheumatoid arthritis) ([place])   . RLS (restless legs syndrome)   . Shortness of breath on exertion   . SUI (stress urinary incontinence, female)   . Venous insufficiency    legs  . Vitamin D deficiency     Past Surgical History:  Procedure Laterality Date  . APPENDECTOMY  07-02-2005   [name]   open  . CARDIAC CATHETERIZATION  01-23-2009  dr Darnell Level brodie   normal coronary arteries and lvf  . CARDIOVASCULAR STRESS TEST  03/31/2016   Low risk nuclear study w/ medium defect of mild severity in basal anterior and mid anterior location w/ no evidence ischemia or infarction/  normal LV  function and wall motion,  nuclear stress ef 71%  . CATARACT EXTRACTION W/ INTRAOCULAR LENS IMPLANT Left 09/2016  . COLONOSCOPY  last one 04-24-2016  . HYSTEROSCOPY W/D&C N/A 12/22/2016   Procedure: DILATATION AND CURETTAGE /HYSTEROSCOPY;  Surgeon: Dian Queen, MD;  Location: [facility];  Service: Gynecology;  Laterality: N/A;  . IR US GUIDANCE  09/08/2016  . POSTERIOR LUMBAR FUSION  02/ 2018    [facility] ([place])  . SKIN CANCER EXCISION    . TONSILLECTOMY  child  . 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 TRANSTHORACIC ECHOCARDIOGRAM  01/21/2009   mild LVH, ef Q000111Q, grade 1 diastolic dysfunction  . ULNAR NERVE TRANSPOSITION Left 12-20-2008  dr sypher   decompression and partial resection medial triceps fascia  . WIDE LOCAL EXCISION PILONIDAL AREA  02-02-2002  [name]   recurrent squamous cell carcinoma in situ    There were no vitals filed for this visit.  Subjective Assessment - 01/12/19 1048    Subjective  I have my husband doing the exercises too.  I felt good on  Tuesday but I'm sluggish today.    Pertinent History  "Kadijha";  left TKR;  lumbar fusion; restless legs, HTN; diabetes;  mild neuropathy;  osteopenia    Currently in Pain?  No/denies         [facility] PT Assessment - 01/12/19 0001      6 minute walk test results    Aerobic Endurance Distance Walked  720    Endurance additional comments  9 perceived exertion    needs seated rest break at 3 minutes                  [facility] Adult PT Treatment/Exercise - 01/12/19 0001      Therapeutic Activites    Therapeutic Activities  ADL's    ADL's  walking, standing, sit to stand       Neuro Re-ed    Neuro Re-ed Details   standing on black foam weight shifting; staggered stand with UE raises, head turns       Knee/Hip Exercises: Stretches   Hip  Flexor Stretch Limitations  2nd step with UE reach 10x right/left       Knee/Hip Exercises: Aerobic   Nustep  L2 7 min while discussing progress       Knee/Hip Exercises: Standing   Forward Step Up  Right;Left;10 reps    Gait Training  counter push ups 1 min    bothered wrist   Other Standing Knee Exercises  wall pull aways 1 minute     Other Standing Knee Exercises  side stepping at counter 1 minute       Knee/Hip Exercises: Seated   Sit to Sand  15 reps   holding 5# weight            PT Education - 01/12/19 1138    Education Details  Access Code: [email or web address]   side stepping; step ups; SLS 30 sec intervals    Person(s) Educated  Patient    Methods  Explanation;Demonstration;Handout    Comprehension  Returned demonstration;Verbalized understanding       PT Short Term Goals - 01/10/19 2018      PT SHORT TERM GOAL #1   Title  independent with initial HEP for balance and strengthening    Time  4    Period  Weeks    Status  New    Target Date  02/07/19      PT SHORT TERM GOAL #2   Title  The patient will have improved BERG balance test to 48/56    Time  4    Period  Weeks    Status  New      PT SHORT TERM GOAL #3   Title  The patient will be able to walk her puppy for 15 minutes    Time  4    Period  Weeks    Status  New      PT SHORT TERM GOAL #4   Title  ...        PT Long Term Goals - 01/10/19 2020      PT LONG TERM GOAL #1   Title  independent with advanced HEP    Time  8    Period  Weeks    Status  New    Target Date  03/07/19      PT LONG TERM GOAL #2   Title  The patient will be able to take her puppy for a 20 min walk    Time  8  Period  Weeks    Status  New      PT LONG TERM GOAL #3   Title  BERG balance score improved to 50/56    Time  8    Period  Weeks    Status  New      PT LONG TERM GOAL #4   Title  Dynamic Gait Index improved to 21 indicating improved balance    Time  8    Period  Weeks    Status  New      PT LONG TERM  GOAL #5   Title  Timed up and go improved to 10 sec or less    Time  8    Period  Weeks    Status  New      Additional Long Term Goals   Additional Long Term Goals  Yes      PT LONG TERM GOAL #6   Title  Bilateral LE strength grossly 4+/5 needed for improved standing and walking tolerance    Time  8    Period  Weeks    Status  New      PT LONG TERM GOAL #7   Title  Six minute walk test > 600 feet needed for community ambulation/shopping    Time  8    Period  Weeks    Status  New            Plan - 01/12/19 1121    Clinical Impression Statement  The patient needs min assist 4x during treatment session to recover balance with narrow base of support, standing on a soft surface and with weight shifting in the posterior direction.  She reports moderate fatigue with 6 min walk test and needs a seated rest break half way through.  She is able to complete 720 feet (norm for age/gender 1500 feet).  She denies left knee pain although she does report anterior tibialis achiness following balance exs and wrist discomfort with UE weight bearing.  Therapist closely monitoring response with all treatment interventions and providing close supervision for safety.    Comorbidities  Lumbar fusion;  left TKR with poor result; HTN; diabetes; asthma; osteopenia; restless legs    Rehab Potential  Good    PT Frequency  2x / week    PT Duration  8 weeks    PT Treatment/Interventions  ADLs/Self Care Home Management;Therapeutic exercise;Therapeutic activities;Balance training;Neuromuscular re-education;Patient/family education    PT Next Visit Plan  Nu-Step;  single leg standing;  balance narrow base of support;  unstable surface;    step ups    PT Home Exercise Plan  Access Code: [email or web address]       Patient will benefit from skilled therapeutic intervention in order to improve the following deficits and impairments:  Difficulty walking, Decreased activity tolerance, Decreased balance, Decreased  strength  Visit Diagnosis: Difficulty in walking, not elsewhere classified  Unspecified lack of coordination  Repeated falls  Muscle weakness (generalized)  Chronic pain of left knee  Other muscle spasm     Problem List Patient Active Problem List   Diagnosis Date Noted  . Weakness of both lower extremities 01/02/2019  . Frequent falls 01/02/2019  . Cellulitis of right elbow 12/29/2018  . OSA on CPAP 06/15/2017  . Diabetes ([place]) 03/02/2017  . Herpes zoster without complication 123456  . Prediabetes 02/23/2017  . Shortness of breath on exertion 02/23/2017  . B12 nutritional deficiency 02/23/2017  . Vitamin D deficiency 12/08/2016  . Tubular adenoma of colon  04/29/2016  . Essential hypertension 09/12/2015  . Allergic rhinitis 07/17/2015  . Insomnia 07/17/2015  . Obese 04/10/2015  . S/P left TKA 04/09/2015  . Osteopenia 12/04/2012  . 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 RESTLESS LEG SYNDROME 02/11/2009  . LEG EDEMA, BILATERAL 02/11/2009  . Hyperlipidemia 10/30/2008  . Urinary incontinence 10/30/2008  . Skin cancer 10/30/2008  . Asthma 11/21/2007  . Venous (peripheral) insufficiency 11/22/2006   Ruben Im, PT 01/12/19 11:57 AM Phone: [phone] Fax: [phone] Alvera Singh 01/12/2019, 11:57 AM  [facility] [address] [place], [place], [address] Phone: [phone]   Fax:  [phone]  Name: QUINTESSA QUILLEN MRN: [id number] Date of Birth: 1940/07/02

## 2019-01-12 NOTE — Patient Instructions (Signed)
Access Code: [email or web address]  URL: [email or web address]  Date: 01/12/2019  Prepared by: Ruben Im   Exercises  Sit to Stand - 10 reps - 1 sets - 2x daily - 7x weekly  Seated Transversus Abdominis Bracing - 10 reps - 1 sets - 5 hold - 3x daily - 7x weekly  Seated Heel Raise - 10 reps - 2 sets - 1x daily - 7x weekly  Step Up - 10 reps - 1 sets - 1x daily - 7x weekly  Side Stepping with Counter Support - 10 reps - 3 sets - 1x daily - 7x weekly  Single Leg Stance - 3 reps - 1 sets - 1x daily - 7x weekly

## 2019-01-17 ENCOUNTER — Ambulatory Visit: Payer: Medicare Other | Admitting: Physical Therapy

## 2019-01-17 ENCOUNTER — Other Ambulatory Visit: Payer: Self-pay

## 2019-01-17 ENCOUNTER — Encounter: Payer: Self-pay | Admitting: Physical Therapy

## 2019-01-17 DIAGNOSIS — M6281 Muscle weakness (generalized): Secondary | ICD-10-CM

## 2019-01-17 DIAGNOSIS — R262 Difficulty in walking, not elsewhere classified: Secondary | ICD-10-CM | POA: Diagnosis not present

## 2019-01-17 DIAGNOSIS — R279 Unspecified lack of coordination: Secondary | ICD-10-CM

## 2019-01-17 DIAGNOSIS — R296 Repeated falls: Secondary | ICD-10-CM

## 2019-01-17 NOTE — Therapy (Signed)
[facility] [address], [place], [place], [address] Phone: [phone]   Fax:  [phone]  Physical Therapy Treatment  Patient Details  Name: Erin Roach MRN: [id number] Date of Birth: 1940-05-22 Referring Provider (PT): Dr. Billey Gosling   Encounter Date: 01/17/2019  PT End of Session - 01/17/19 1623    Visit Number  3    Date for PT Re-Evaluation  03/07/19    Authorization Type  UHC Medicare    PT Start Time  1618    PT Stop Time  1700    PT Time Calculation (min)  42 min    Activity Tolerance  Patient tolerated treatment well    Behavior During Therapy  [facility] for tasks assessed/performed       Past Medical History:  Diagnosis Date  . Allergic rhinitis   . Apnea   . Arthritis   . Asthma    very mild  . Back pain   . Bilateral lower extremity edema   . Carotid artery disease ([place])    a. Carotid duplex 03/2016 - duplex was stable, 123456 RICA, 123456 LICA  . Complication of anesthesia    post-op delirium  . Endometrial polyp   . Essential hypertension   . GERD (gastroesophageal reflux disease)   . Heart murmur    per pt  . History of adenomatous polyp of colon    04-24-2016  tubular adenoma  . History of squamous cell carcinoma excision    pilonidal area second excision 02-02-2002  . HTN (hypertension)   . Hyperlipidemia   . Leg pain   . OAB (overactive bladder)   . RA (rheumatoid arthritis) ([place])   . RLS (restless legs syndrome)   . Shortness of breath on exertion   . SUI (stress urinary incontinence, female)   . Venous insufficiency    legs  . Vitamin D deficiency     Past Surgical History:  Procedure Laterality Date  . APPENDECTOMY  07-02-2005   [name]   open  . CARDIAC CATHETERIZATION  01-23-2009  dr Darnell Level brodie   normal coronary arteries and lvf  . CARDIOVASCULAR STRESS TEST  03/31/2016   Low risk nuclear study w/ medium defect of mild severity in basal anterior and mid  anterior location w/ no evidence ischemia or infarction/  normal LV function and wall motion,  nuclear stress ef 71%  . CATARACT EXTRACTION W/ INTRAOCULAR LENS IMPLANT Left 09/2016  . COLONOSCOPY  last one 04-24-2016  . HYSTEROSCOPY W/D&C N/A 12/22/2016   Procedure: DILATATION AND CURETTAGE /HYSTEROSCOPY;  Surgeon: Dian Queen, MD;  Location: [facility];  Service: Gynecology;  Laterality: N/A;  . IR US GUIDANCE  09/08/2016  . POSTERIOR LUMBAR FUSION  02/ 2018    [facility] ([place])  . SKIN CANCER EXCISION    . TONSILLECTOMY  child  . 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 TRANSTHORACIC ECHOCARDIOGRAM  01/21/2009   mild LVH, ef Q000111Q, grade 1 diastolic dysfunction  . ULNAR NERVE TRANSPOSITION Left 12-20-2008  dr sypher   decompression and partial resection medial triceps fascia  . WIDE LOCAL EXCISION PILONIDAL AREA  02-02-2002  [name]   recurrent squamous cell carcinoma in situ    There were no vitals filed for this visit.  Subjective Assessment - 01/17/19 1622    Subjective  "I felt  it the day after last visit but in a good way."  MD has adjusted meds and I'm less light-headed.    Pertinent History  "Richie";  left TKR;  lumbar fusion; restless legs, HTN; diabetes;  mild neuropathy;  osteopenia    How long can you walk comfortably?  push grocery cart 30 minutes holding on;  not holding on 15 minutes    Diagnostic tests  none    Patient Stated Goals  not to have those spells happen and be more active again    Currently in Pain?  No/denies                       [facility] Adult PT Treatment/Exercise - 01/17/19 0001      Neuro Re-ed    Neuro Re-ed Details   standing on black foam pad x 1' end of session      Exercises   Exercises  Ankle      Knee/Hip Exercises: Aerobic   Stationary Bike  L2 x 6', PT  present to discuss progress      Knee/Hip Exercises: Standing   Forward Step Up  Right;Left;10 reps;Hand Hold: 0    Other Standing Knee Exercises  weight shifts on trampoline side to side, stagger stance bil, PT cued stand tall through hips, tighten quads without locking knees, "zip up" core for TrA    Other Standing Knee Exercises  side stepping at counter 1 pass without resistance, 2 passes with yellow band around ankles      Knee/Hip Exercises: Seated   Sit to Sand  15 reps   3x5     Ankle Exercises: Seated   ABC's  1 rep    ABC's Limitations  Lt only    Heel Raises  20 reps    Heel Raises Limitations  with toe raises, bil    Other Seated Ankle Exercises  Lt ankle inversion/eversion x 10 reps           Balance Exercises - 01/17/19 1705      Balance Exercises: Standing   SLS  1 rep;Upper extremity support 1;30 secs;Eyes open   bil   Gait with Head Turns  Forward   100', gait belt, initial LOB, improved with reps   Tandem Gait  Forward;Upper extremity support;1 rep          PT Short Term Goals - 01/17/19 1624      PT SHORT TERM GOAL #1   Title  independent with initial HEP for balance and strengthening    Status  Achieved      PT SHORT TERM GOAL #3   Title  The patient will be able to walk her puppy for 15 minutes    Status  On-going        PT Long Term Goals - 01/10/19 2020      PT LONG TERM GOAL #1   Title  independent with advanced HEP    Time  8    Period  Weeks    Status  New    Target Date  03/07/19      PT LONG TERM GOAL #2   Title  The patient will be able to take her puppy for a 20 min walk    Time  8    Period  Weeks    Status  New      PT LONG TERM GOAL #3   Title  BERG balance score improved to 50/56    Time  8    Period  Weeks    Status  New      PT LONG TERM GOAL #4   Title  Dynamic Gait Index improved to 21 indicating improved balance    Time  8    Period  Weeks    Status  New      PT LONG TERM GOAL #5   Title  Timed up and  go improved to 10 sec or less    Time  8    Period  Weeks    Status  New      Additional Long Term Goals   Additional Long Term Goals  Yes      PT LONG TERM GOAL #6   Title  Bilateral LE strength grossly 4+/5 needed for improved standing and walking tolerance    Time  8    Period  Weeks    Status  New      PT LONG TERM GOAL #7   Title  Six minute walk test > 600 feet needed for community ambulation/shopping    Time  8    Period  Weeks    Status  New            Plan - 01/17/19 1709    Clinical Impression Statement  Pt reported soreness "in a good way" after last visit.  Pt reports mild/mod Lt knee pain from prior TKR and PT noted Lt ankle weakness today.  PT updated HEP to include targeted Lt ankle strength as this will play a role in balance.  PT focused on neuro re-ed and balance with weight shifting, gait with head turns, intro of tandem walking, balance board today.  Pt reported fatigue end of session.  Continue along POC with careful progression.  Gait belt wiht dynamic gait/balance tasks.    Comorbidities  Lumbar fusion;  left TKR with poor result; HTN; diabetes; asthma; osteopenia; restless legs    Rehab Potential  Good    PT Frequency  2x / week    PT Duration  8 weeks    PT Treatment/Interventions  ADLs/Self Care Home Management;Therapeutic exercise;Therapeutic activities;Balance training;Neuromuscular re-education;Patient/family education    PT Next Visit Plan  Nu-Step, stand with foot on step, gait with head turns, unstable surface, step ups, sit to stand, Lt ankle strength    PT Home Exercise Plan  Access Code: [email or web address]    Consulted and Agree with Plan of Care  Patient       Patient will benefit from skilled therapeutic intervention in order to improve the following deficits and impairments:     Visit Diagnosis: Difficulty in walking, not elsewhere classified  Unspecified lack of coordination  Repeated falls  Muscle weakness (generalized)     Problem  List Patient Active Problem List   Diagnosis Date Noted  . Weakness of both lower extremities 01/02/2019  . Frequent falls 01/02/2019  . Cellulitis of right elbow 12/29/2018  . OSA on CPAP 06/15/2017  . Diabetes ([place]) 03/02/2017  . Herpes zoster without complication 123456  . Prediabetes 02/23/2017  . Shortness of breath on exertion 02/23/2017  . B12 nutritional deficiency 02/23/2017  . Vitamin D deficiency 12/08/2016  . Tubular adenoma of colon 04/29/2016  . Essential hypertension 09/12/2015  . Allergic rhinitis 07/17/2015  . Insomnia 07/17/2015  . Obese 04/10/2015  . S/P left TKA 04/09/2015  . Osteopenia 12/04/2012  . 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 RESTLESS LEG SYNDROME 02/11/2009  . LEG EDEMA, BILATERAL 02/11/2009  . Hyperlipidemia 10/30/2008  .  Urinary incontinence 10/30/2008  . Skin cancer 10/30/2008  . Asthma 11/21/2007  . Venous (peripheral) insufficiency 11/22/2006    Baruch Merl, PT 01/17/19 5:13 PM   [facility] [address] [place], [place], [address] Phone: [phone]   Fax:  [phone]  Name: Erin Roach MRN: [id number] Date of Birth: 09-Jul-1940

## 2019-01-19 ENCOUNTER — Ambulatory Visit: Payer: Medicare Other | Admitting: Physical Therapy

## 2019-01-19 ENCOUNTER — Other Ambulatory Visit: Payer: Self-pay

## 2019-01-19 ENCOUNTER — Encounter: Payer: Self-pay | Admitting: Physical Therapy

## 2019-01-19 DIAGNOSIS — R262 Difficulty in walking, not elsewhere classified: Secondary | ICD-10-CM

## 2019-01-19 DIAGNOSIS — R296 Repeated falls: Secondary | ICD-10-CM

## 2019-01-19 DIAGNOSIS — M6281 Muscle weakness (generalized): Secondary | ICD-10-CM

## 2019-01-19 DIAGNOSIS — M25562 Pain in left knee: Secondary | ICD-10-CM

## 2019-01-19 DIAGNOSIS — R279 Unspecified lack of coordination: Secondary | ICD-10-CM

## 2019-01-19 DIAGNOSIS — G8929 Other chronic pain: Secondary | ICD-10-CM

## 2019-01-19 NOTE — Therapy (Signed)
[facility] [address] [place], [place], [address] Phone: [phone]   Fax:  [phone]  Physical Therapy Treatment  Patient Details  Name: Erin Roach MRN: [id number] Date of Birth: November 20, 1940 Referring Provider (PT): Dr. Billey Gosling   Encounter Date: 01/19/2019  PT End of Session - 01/19/19 1405    Visit Number  4    Date for PT Re-Evaluation  03/07/19    Authorization Type  UHC Medicare    PT Start Time  [id number]    PT Stop Time  1443    PT Time Calculation (min)  44 min    Activity Tolerance  Patient tolerated treatment well    Behavior During Therapy  [facility] for tasks assessed/performed       Past Medical History:  Diagnosis Date  . Allergic rhinitis   . Apnea   . Arthritis   . Asthma    very mild  . Back pain   . Bilateral lower extremity edema   . Carotid artery disease ([place])    a. Carotid duplex 03/2016 - duplex was stable, 123456 RICA, 123456 LICA  . Complication of anesthesia    post-op delirium  . Endometrial polyp   . Essential hypertension   . GERD (gastroesophageal reflux disease)   . Heart murmur    per pt  . History of adenomatous polyp of colon    04-24-2016  tubular adenoma  . History of squamous cell carcinoma excision    pilonidal area second excision 02-02-2002  . HTN (hypertension)   . Hyperlipidemia   . Leg pain   . OAB (overactive bladder)   . RA (rheumatoid arthritis) ([place])   . RLS (restless legs syndrome)   . Shortness of breath on exertion   . SUI (stress urinary incontinence, female)   . Venous insufficiency    legs  . Vitamin D deficiency     Past Surgical History:  Procedure Laterality Date  . APPENDECTOMY  07-02-2005   [name]   open  . CARDIAC CATHETERIZATION  01-23-2009  dr Darnell Level brodie   normal coronary arteries and lvf  . CARDIOVASCULAR STRESS TEST  03/31/2016   Low risk nuclear study w/ medium defect of mild severity in basal anterior and mid  anterior location w/ no evidence ischemia or infarction/  normal LV function and wall motion,  nuclear stress ef 71%  . CATARACT EXTRACTION W/ INTRAOCULAR LENS IMPLANT Left 09/2016  . COLONOSCOPY  last one 04-24-2016  . HYSTEROSCOPY W/D&C N/A 12/22/2016   Procedure: DILATATION AND CURETTAGE /HYSTEROSCOPY;  Surgeon: Dian Queen, MD;  [facility];  Service: Gynecology;  Laterality: N/A;  . IR US GUIDANCE  09/08/2016  . POSTERIOR LUMBAR FUSION  02/ 2018    [facility] ([place], [place])  . SKIN CANCER EXCISION    . TONSILLECTOMY  child  . 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 TRANSTHORACIC ECHOCARDIOGRAM  01/21/2009   mild LVH, ef Q000111Q, grade 1 diastolic dysfunction  . ULNAR NERVE TRANSPOSITION Left 12-20-2008  dr sypher   decompression and partial resection medial triceps fascia  . WIDE LOCAL EXCISION PILONIDAL AREA  02-02-2002  [name]   recurrent squamous cell carcinoma in situ    There were no vitals filed for this visit.  Subjective Assessment - 01/19/19 1401    Subjective  I was  sore again after last visit and I can't do the exercises the day after to allow recovery but it's a good sore.  I had to bathe my dog twice yesterday and it hurt my back but I'm better today.  No more episodes of light-headedness.    Pertinent History  "Vyvy";  left TKR;  lumbar fusion; restless legs, HTN; diabetes;  mild neuropathy;  osteopenia    Limitations  House hold activities    How long can you walk comfortably?  push grocery cart 30 minutes holding on;  not holding on 15 minutes    Diagnostic tests  none    Patient Stated Goals  not to have those spells happen and be more active again    Currently in Pain?  No/denies                       [facility] Adult PT Treatment/Exercise - 01/19/19 0001      Exercises    Exercises  Lumbar;Knee/Hip;Ankle      Lumbar Exercises: Supine   Pelvic Tilt  5 reps      Knee/Hip Exercises: Aerobic   Nustep  seat 8 L2 x 7', PT present to discuss symptoms and response to PT so far      Knee/Hip Exercises: Seated   Sit to Sand  2 sets;10 reps   holding 5#     Knee/Hip Exercises: Supine   Bridges  Strengthening;Both;2 sets;10 reps    Bridges Limitations  articulating and regular, 1x each set    Straight Leg Raises  Both;Strengthening;3 sets;5 reps    Straight Leg Raises Limitations  with TrA cues      Ankle Exercises: Seated   Ankle Circles/Pumps  AROM;Strengthening;Left;10 reps    Ankle Circles/Pumps Limitations  circles both directions seated    Heel Raises  20 reps    Toe Raise  20 reps    Other Seated Ankle Exercises  long sitting yellow tband inversion/eversion x 10 reps on LEFT, added to HEP          Balance Exercises - 01/19/19 1426      Balance Exercises: Standing   Standing Eyes Opened  Foam/compliant surface;Head turns;Narrow base of support (BOS);Wide ([place])   with weight    Gait with Head Turns  Forward        PT Education - 01/19/19 1443    Education Details  Access Code: [email or web address]    Person(s) Educated  Patient    Methods  Explanation;Demonstration;Verbal cues;Handout    Comprehension  Verbalized understanding;Returned demonstration       PT Short Term Goals - 01/17/19 1624      PT SHORT TERM GOAL #1   Title  independent with initial HEP for balance and strengthening    Status  Achieved      PT SHORT TERM GOAL #3   Title  The patient will be able to walk her puppy for 15 minutes    Status  On-going        PT Long Term Goals - 01/10/19 2020      PT LONG TERM GOAL #1   Title  independent with advanced HEP    Time  8    Period  Weeks    Status  New    Target Date  03/07/19      PT LONG TERM GOAL #2   Title  The patient will be able to take her puppy for a 20 min walk  Time  8    Period  Weeks    Status  New       PT LONG TERM GOAL #3   Title  BERG balance score improved to 50/56    Time  8    Period  Weeks    Status  New      PT LONG TERM GOAL #4   Title  Dynamic Gait Index improved to 21 indicating improved balance    Time  8    Period  Weeks    Status  New      PT LONG TERM GOAL #5   Title  Timed up and go improved to 10 sec or less    Time  8    Period  Weeks    Status  New      Additional Long Term Goals   Additional Long Term Goals  Yes      PT LONG TERM GOAL #6   Title  Bilateral LE strength grossly 4+/5 needed for improved standing and walking tolerance    Time  8    Period  Weeks    Status  New      PT LONG TERM GOAL #7   Title  Six minute walk test > 600 feet needed for community ambulation/shopping    Time  8    Period  Weeks    Status  New            Plan - 01/19/19 1444    Clinical Impression Statement  Pt reports soreness x 1 day following PT visits, limiting compliance of HEP at this time.  PT updated HEP to progress Lt ankle strength.  Pt already showing improvement in gait steadiness and balance with head turns and in DLS on compliant surface with head turns.  Lt knee pain from past TKR limits some exercise and PT varied standing, sitting and supine ther ex to give knee a break.  Pt will benefit from ongoing POC for gait, balance, strength.    Comorbidities  Lumbar fusion;  left TKR with poor result; HTN; diabetes; asthma; osteopenia; restless legs    PT Frequency  2x / week    PT Duration  8 weeks    PT Treatment/Interventions  ADLs/Self Care Home Management;Therapeutic exercise;Therapeutic activities;Balance training;Neuromuscular re-education;Patient/family education    PT Next Visit Plan  Nu-Step, stand with foot on step, gait with head turns, unstable surface, step ups, sit to stand, Lt ankle strength, add SLR and bridges to HEP    PT Home Exercise Plan  Access Code: [email or web address]    Consulted and Agree with Plan of Care  Patient       Patient will benefit  from skilled therapeutic intervention in order to improve the following deficits and impairments:     Visit Diagnosis: Difficulty in walking, not elsewhere classified  Unspecified lack of coordination  Repeated falls  Muscle weakness (generalized)  Chronic pain of left knee     Problem List Patient Active Problem List   Diagnosis Date Noted  . Weakness of both lower extremities 01/02/2019  . Frequent falls 01/02/2019  . Cellulitis of right elbow 12/29/2018  . OSA on CPAP 06/15/2017  . Diabetes ([place]) 03/02/2017  . Herpes zoster without complication 123456  . Prediabetes 02/23/2017  . Shortness of breath on exertion 02/23/2017  . B12 nutritional deficiency 02/23/2017  . Vitamin D deficiency 12/08/2016  . Tubular adenoma of colon 04/29/2016  . Essential hypertension 09/12/2015  . Allergic rhinitis 07/17/2015  .  Insomnia 07/17/2015  . Obese 04/10/2015  . S/P left TKA 04/09/2015  . Osteopenia 12/04/2012  . 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 RESTLESS LEG SYNDROME 02/11/2009  . LEG EDEMA, BILATERAL 02/11/2009  . Hyperlipidemia 10/30/2008  . Urinary incontinence 10/30/2008  . Skin cancer 10/30/2008  . Asthma 11/21/2007  . Venous (peripheral) insufficiency 11/22/2006    Baruch Merl, PT 01/19/19 2:48 PM   [facility] [address], [place], [place], [address] Phone: [phone]   Fax:  [phone]  Name: Erin Roach MRN: [id number] Date of Birth: 1941/01/27

## 2019-01-19 NOTE — Patient Instructions (Signed)
Access Code: [email or web address]  URL: [email or web address]  Date: 01/19/2019  Prepared by: Venetia Night Beuhring   Exercises  Sit to Stand - 10 reps - 1 sets - 2x daily - 7x weekly  Seated Transversus Abdominis Bracing - 10 reps - 1 sets - 5 hold - 3x daily - 7x weekly  Step Up - 10 reps - 1 sets - 1x daily - 7x weekly  Side Stepping with Counter Support - 10 reps - 3 sets - 1x daily - 7x weekly  Single Leg Stance - 3 reps - 1 sets - 1x daily - 7x weekly  Seated Heel Toe Raises - 10 reps - 1 sets - 2 hold - 1x daily - 7x weekly  Seated Ankle Inversion Eversion AROM - 10 reps - 1 sets - 1x daily - 7x weekly  Seated Ankle Circles - 10 reps - 1 sets - 1x daily - 7x weekly  Long Sitting Ankle Inversion with Anchored Resistance - 10 reps - 2 sets - 1x daily - 7x weekly  Ankle Eversion with Resistance - 10 reps - 2 sets - 1x daily - 7x weekly

## 2019-01-22 ENCOUNTER — Emergency Department (HOSPITAL_COMMUNITY): Payer: Medicare Other

## 2019-01-22 ENCOUNTER — Other Ambulatory Visit: Payer: Self-pay

## 2019-01-22 ENCOUNTER — Observation Stay (HOSPITAL_COMMUNITY)
Admission: EM | Admit: 2019-01-22 | Discharge: 2019-01-23 | Disposition: A | Discharge status: Home / Self Care | Payer: Medicare Other | Attending: Internal Medicine | Admitting: Internal Medicine

## 2019-01-22 ENCOUNTER — Encounter (HOSPITAL_COMMUNITY): Payer: Self-pay

## 2019-01-22 DIAGNOSIS — J45909 Unspecified asthma, uncomplicated: Secondary | ICD-10-CM | POA: Diagnosis not present

## 2019-01-22 DIAGNOSIS — G2581 Restless legs syndrome: Secondary | ICD-10-CM | POA: Diagnosis present

## 2019-01-22 DIAGNOSIS — E669 Obesity, unspecified: Secondary | ICD-10-CM | POA: Diagnosis present

## 2019-01-22 DIAGNOSIS — I259 Chronic ischemic heart disease, unspecified: Secondary | ICD-10-CM | POA: Insufficient documentation

## 2019-01-22 DIAGNOSIS — Z20828 Contact with and (suspected) exposure to other viral communicable diseases: Secondary | ICD-10-CM | POA: Insufficient documentation

## 2019-01-22 DIAGNOSIS — I10 Essential (primary) hypertension: Secondary | ICD-10-CM | POA: Diagnosis present

## 2019-01-22 DIAGNOSIS — E1122 Type 2 diabetes mellitus with diabetic chronic kidney disease: Secondary | ICD-10-CM

## 2019-01-22 DIAGNOSIS — Z85828 Personal history of other malignant neoplasm of skin: Secondary | ICD-10-CM | POA: Diagnosis not present

## 2019-01-22 DIAGNOSIS — Z87891 Personal history of nicotine dependence: Secondary | ICD-10-CM | POA: Insufficient documentation

## 2019-01-22 DIAGNOSIS — Z96652 Presence of left artificial knee joint: Secondary | ICD-10-CM | POA: Diagnosis not present

## 2019-01-22 DIAGNOSIS — R079 Chest pain, unspecified: Secondary | ICD-10-CM | POA: Diagnosis not present

## 2019-01-22 DIAGNOSIS — I452 Bifascicular block: Secondary | ICD-10-CM | POA: Diagnosis present

## 2019-01-22 DIAGNOSIS — I152 Hypertension secondary to endocrine disorders: Secondary | ICD-10-CM | POA: Diagnosis present

## 2019-01-22 DIAGNOSIS — E1169 Type 2 diabetes mellitus with other specified complication: Secondary | ICD-10-CM | POA: Diagnosis present

## 2019-01-22 DIAGNOSIS — Z79899 Other long term (current) drug therapy: Secondary | ICD-10-CM | POA: Insufficient documentation

## 2019-01-22 DIAGNOSIS — E66811 Obesity, class 1: Secondary | ICD-10-CM | POA: Diagnosis present

## 2019-01-22 DIAGNOSIS — Z794 Long term (current) use of insulin: Secondary | ICD-10-CM | POA: Diagnosis not present

## 2019-01-22 DIAGNOSIS — E119 Type 2 diabetes mellitus without complications: Secondary | ICD-10-CM

## 2019-01-22 DIAGNOSIS — E785 Hyperlipidemia, unspecified: Secondary | ICD-10-CM | POA: Diagnosis present

## 2019-01-22 DIAGNOSIS — R0789 Other chest pain: Secondary | ICD-10-CM | POA: Diagnosis not present

## 2019-01-22 HISTORY — DX: Bifascicular block: I45.2

## 2019-01-22 LAB — CBG MONITORING, ED: Glucose-Capillary: 90 mg/dL (ref 70–99)

## 2019-01-22 LAB — CBC
HCT: 32.9 % — ABNORMAL LOW (ref 36.0–46.0)
Hemoglobin: 11 g/dL — ABNORMAL LOW (ref 12.0–15.0)
MCH: 30.3 pg (ref 26.0–34.0)
MCHC: 33.4 g/dL (ref 30.0–36.0)
MCV: 90.6 fL (ref 80.0–100.0)
Platelets: 264 10*3/uL (ref 150–400)
RBC: 3.63 MIL/uL — ABNORMAL LOW (ref 3.87–5.11)
RDW: 12.2 % (ref 11.5–15.5)
WBC: 7.8 10*3/uL (ref 4.0–10.5)
nRBC: 0 % (ref 0.0–0.2)

## 2019-01-22 LAB — HEPATIC FUNCTION PANEL
ALT: 36 U/L (ref 0–44)
AST: 26 U/L (ref 15–41)
Albumin: 3.6 g/dL (ref 3.5–5.0)
Alkaline Phosphatase: 73 U/L (ref 38–126)
Bilirubin, Direct: 0.1 mg/dL (ref 0.0–0.2)
Indirect Bilirubin: 0.4 mg/dL (ref 0.3–0.9)
Total Bilirubin: 0.5 mg/dL (ref 0.3–1.2)
Total Protein: 6.7 g/dL (ref 6.5–8.1)

## 2019-01-22 LAB — BASIC METABOLIC PANEL
Anion gap: 10 (ref 5–15)
BUN: 18 mg/dL (ref 8–23)
CO2: 26 mmol/L (ref 22–32)
Calcium: 8.8 mg/dL — ABNORMAL LOW (ref 8.9–10.3)
Chloride: 104 mmol/L (ref 98–111)
Creatinine, Ser: 1.03 mg/dL — ABNORMAL HIGH (ref 0.44–1.00)
GFR calc Af Amer: >60 mL/min (ref >60)
GFR calc non Af Amer: 53 mL/min — ABNORMAL LOW (ref >60)
Glucose, Bld: 99 mg/dL (ref 70–99)
Potassium: 4.3 mmol/L (ref 3.5–5.1)
Sodium: 140 mmol/L (ref 135–145)

## 2019-01-22 LAB — TROPONIN I (HIGH SENSITIVITY)
Troponin I (High Sensitivity): 5 ng/L (ref <18)
Troponin I (High Sensitivity): 3 ng/L (ref <18)

## 2019-01-22 LAB — GLUCOSE, CAPILLARY: Glucose-Capillary: 129 mg/dL — ABNORMAL HIGH (ref 70–99)

## 2019-01-22 LAB — D-DIMER, QUANTITATIVE: D-Dimer, Quant: 1.45 ug/mL-FEU — ABNORMAL HIGH (ref 0.00–0.50)

## 2019-01-22 LAB — SARS CORONAVIRUS 2 (TAT 6-24 HRS): SARS Coronavirus 2: NEGATIVE

## 2019-01-22 MED ORDER — ASPIRIN EC 81 MG PO TBEC
81.0000 mg | DELAYED_RELEASE_TABLET | Freq: Every day | ORAL | Status: DC
  Administered 2019-01-23: 81 mg via ORAL
  Filled 2019-01-22 (×2): qty 1

## 2019-01-22 MED ORDER — AMITRIPTYLINE HCL 25 MG PO TABS
50.0000 mg | ORAL_TABLET | Freq: Every day | ORAL | Status: DC
  Administered 2019-01-22: 50 mg via ORAL
  Filled 2019-01-22: qty 2

## 2019-01-22 MED ORDER — PANTOPRAZOLE SODIUM 40 MG PO TBEC: 40.0000 mg | DELAYED_RELEASE_TABLET | Freq: Every day | ORAL | Status: DC

## 2019-01-22 MED ORDER — ALBUTEROL SULFATE (2.5 MG/3ML) 0.083% IN NEBU: 2.5000 mg | INHALATION_SOLUTION | Freq: Four times a day (QID) | RESPIRATORY_TRACT | Status: DC | PRN

## 2019-01-22 MED ORDER — PREGABALIN 75 MG PO CAPS
75.0000 mg | ORAL_CAPSULE | Freq: Every evening | ORAL | Status: DC
  Administered 2019-01-22: 20:00:00 75 mg via ORAL
  Filled 2019-01-22: qty 1

## 2019-01-22 MED ORDER — SIMVASTATIN 20 MG PO TABS
40.0000 mg | ORAL_TABLET | Freq: Every day | ORAL | Status: DC
  Administered 2019-01-22: 40 mg via ORAL
  Filled 2019-01-22: qty 2

## 2019-01-22 MED ORDER — ONDANSETRON HCL 4 MG/2ML IJ SOLN: 4.0000 mg | Freq: Four times a day (QID) | INTRAMUSCULAR | Status: DC | PRN

## 2019-01-22 MED ORDER — NITROGLYCERIN 0.4 MG SL SUBL: 0.4000 mg | SUBLINGUAL_TABLET | SUBLINGUAL | Status: DC | PRN

## 2019-01-22 MED ORDER — MOMETASONE FURO-FORMOTEROL FUM 200-5 MCG/ACT IN AERO
2.0000 | INHALATION_SPRAY | Freq: Two times a day (BID) | RESPIRATORY_TRACT | Status: DC
  Filled 2019-01-22: qty 8.8

## 2019-01-22 MED ORDER — LOSARTAN POTASSIUM 50 MG PO TABS
100.0000 mg | ORAL_TABLET | Freq: Every day | ORAL | Status: DC
  Administered 2019-01-23: 100 mg via ORAL
  Filled 2019-01-22: qty 2

## 2019-01-22 MED ORDER — ACETAMINOPHEN 325 MG PO TABS: 650.0000 mg | ORAL_TABLET | ORAL | Status: DC | PRN

## 2019-01-22 MED ORDER — FUROSEMIDE 20 MG PO TABS: 20.0000 mg | ORAL_TABLET | Freq: Every day | ORAL | Status: DC

## 2019-01-22 MED ORDER — ASPIRIN 81 MG PO CHEW
324.0000 mg | CHEWABLE_TABLET | Freq: Once | ORAL | Status: AC
  Administered 2019-01-22: 324 mg via ORAL
  Filled 2019-01-22: qty 4

## 2019-01-22 MED ORDER — SODIUM CHLORIDE 0.9% FLUSH: 3.0000 mL | Freq: Once | INTRAVENOUS | Status: DC

## 2019-01-22 MED ORDER — ENOXAPARIN SODIUM 40 MG/0.4ML ~~LOC~~ SOLN
40.0000 mg | SUBCUTANEOUS | Status: DC
  Administered 2019-01-22: 40 mg via SUBCUTANEOUS
  Filled 2019-01-22: qty 0.4

## 2019-01-22 MED ORDER — HYDRALAZINE HCL 20 MG/ML IJ SOLN: 5.0000 mg | Freq: Four times a day (QID) | INTRAMUSCULAR | Status: DC | PRN

## 2019-01-22 MED ORDER — INSULIN ASPART 100 UNIT/ML ~~LOC~~ SOLN: 0.0000 [IU] | Freq: Every day | SUBCUTANEOUS | Status: DC

## 2019-01-22 MED ORDER — PRAMIPEXOLE DIHYDROCHLORIDE 0.25 MG PO TABS
0.2500 mg | ORAL_TABLET | Freq: Two times a day (BID) | ORAL | Status: DC
  Administered 2019-01-22: 0.25 mg via ORAL
  Filled 2019-01-22 (×3): qty 1

## 2019-01-22 MED ORDER — NITROGLYCERIN 0.4 MG SL SUBL
SUBLINGUAL_TABLET | SUBLINGUAL | Status: AC
  Administered 2019-01-22: 22:00:00
  Filled 2019-01-22: qty 1

## 2019-01-22 MED ORDER — FLUTICASONE PROPIONATE 50 MCG/ACT NA SUSP
1.0000 | Freq: Every day | NASAL | Status: DC | PRN
  Filled 2019-01-22: qty 16

## 2019-01-22 MED ORDER — INSULIN ASPART 100 UNIT/ML ~~LOC~~ SOLN: 0.0000 [IU] | Freq: Three times a day (TID) | SUBCUTANEOUS | Status: DC

## 2019-01-22 NOTE — ED Provider Notes (Signed)
Patient remains pain-free and initial labs are within normal limits.  However given Heart score of 6, older age and risk factors with good story will admit for r/o.   Blanchie Dessert, MD 01/22/19 [phone]

## 2019-01-22 NOTE — ED Notes (Signed)
ED TO INPATIENT HANDOFF REPORT  ED Nurse Name and Phone #: [facility] [phone]   S Name/Age/Gender Erin Roach 78 y.o. female Room/Bed: 031C/031C  Code Status   Code Status: Prior  Home/SNF/Other Home Patient oriented to: self, place, time and situation Is this baseline? Yes   Triage Complete: Triage complete  Chief Complaint upper chest & jaw pain  Triage Note Pt c/o sudden onset chest pain approx 1 hour ago with radaition to right jaw and sweating. Denies shob or nausea.    Allergies No Known Allergies  Level of Care/Admitting Diagnosis ED Disposition    ED Disposition Condition [facility] Area: [place] [[id number]]  Level of Care: Telemetry Cardiac [103]  I expect the patient will be discharged within 24 hours: No (not a candidate for 5C-Observation unit)  Covid Evaluation: Asymptomatic Screening Protocol (No Symptoms)  Diagnosis: Chest pain [id number]  Admitting Physician: Hosie Poisson [[id number]]  Attending Physician: Hosie Poisson [[id number]]  PT Class (Do Not Modify): Observation [104]  PT Acc Code (Do Not Modify): Observation [10022]       B Medical/Surgery History Past Medical History:  Diagnosis Date  . Allergic rhinitis   . Apnea   . Arthritis   . Asthma    very mild  . Back pain   . Bilateral lower extremity edema   . Carotid artery disease ([place])    a. Carotid duplex 03/2016 - duplex was stable, [id number] RICA, 123456 LICA  . Complication of anesthesia    post-op delirium  . Endometrial polyp   . Essential hypertension   . GERD (gastroesophageal reflux disease)   . Heart murmur    per pt  . History of adenomatous polyp of colon    04-24-2016  tubular adenoma  . History of squamous cell carcinoma excision    pilonidal area second excision 02-02-2002  . HTN (hypertension)   . Hyperlipidemia   . Leg pain   . OAB (overactive bladder)   . RA (rheumatoid arthritis) ([place])   . RLS (restless legs syndrome)   . Shortness of  breath on exertion   . SUI (stress urinary incontinence, female)   . Venous insufficiency    legs  . Vitamin D deficiency    Past Surgical History:  Procedure Laterality Date  . APPENDECTOMY  07-02-2005   [name]   open  . CARDIAC CATHETERIZATION  01-23-2009  dr Darnell Level brodie   normal coronary arteries and lvf  . CARDIOVASCULAR STRESS TEST  03/31/2016   Low risk nuclear study w/ medium defect of mild severity in basal anterior and mid anterior location w/ no evidence ischemia or infarction/  normal LV function and wall motion,  nuclear stress ef 71%  . CATARACT EXTRACTION W/ INTRAOCULAR LENS IMPLANT Left 09/2016  . COLONOSCOPY  last one 04-24-2016  . HYSTEROSCOPY W/D&C N/A 12/22/2016   Procedure: DILATATION AND CURETTAGE /HYSTEROSCOPY;  Surgeon: Dian Queen, MD;  Location[facility];  Service: Gynecology;  Laterality: N/A;  . IR US GUIDANCE  09/08/2016  . POSTERIOR LUMBAR FUSION  02/ 2018    [facility] ([place])  . SKIN CANCER EXCISION    . TONSILLECTOMY  child  . 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 TRANSTHORACIC ECHOCARDIOGRAM  01/21/2009   mild LVH, ef [id number], grade 1 diastolic dysfunction  . ULNAR NERVE TRANSPOSITION  Left 12-20-2008  dr sypher   decompression and partial resection medial triceps fascia  . WIDE LOCAL EXCISION PILONIDAL AREA  02-02-2002  [name]   recurrent squamous cell carcinoma in situ     A IV Location/Drains/Wounds Patient Lines/Drains/Airways Status   Active Line/Drains/Airways    Name:   Placement date:   Placement time:   Site:   Days:   Peripheral IV 04/09/15 Left Hand   04/09/15    0730    Hand   1384   Peripheral IV 01/22/19 Left Antecubital   01/22/19    1452    Antecubital   less than 1   Incision (Closed) 04/09/15 Knee Left   04/09/15    0808     1384   Incision (Closed)  12/22/16 Vagina   12/22/16    0716     761          Intake/Output Last 24 hours No intake or output data in the 24 hours ending 01/22/19 1809  Labs/Imaging Results for orders placed or performed during the hospital encounter of 01/22/19 (from the past 48 hour(s))  CBG monitoring, ED     Status: None   Collection Time: 01/22/19  2:18 PM  Result Value Ref Range   Glucose-Capillary 90 70 - 99 mg/dL   Comment 1 Notify RN    Comment 2 Document in Chart   Basic metabolic panel     Status: Abnormal   Collection Time: 01/22/19  2:50 PM  Result Value Ref Range   Sodium 140 135 - 145 mmol/L   Potassium 4.3 3.5 - 5.1 mmol/L   Chloride 104 98 - 111 mmol/L   CO2 26 22 - 32 mmol/L   Glucose, Bld 99 70 - 99 mg/dL   BUN 18 8 - 23 mg/dL   Creatinine, Ser 1.03 (H) 0.44 - 1.00 mg/dL   Calcium 8.8 (L) 8.9 - 10.3 mg/dL   GFR calc non Af Amer 53 (L) >60 mL/min   GFR calc Af Amer >60 >60 mL/min   Anion gap 10 5 - 15    Comment: Performed at [facility], [address], [place], [place] [address]  CBC     Status: Abnormal   Collection Time: 01/22/19  2:50 PM  Result Value Ref Range   WBC 7.8 4.0 - 10.5 K/uL   RBC 3.63 (L) 3.87 - 5.11 MIL/uL   Hemoglobin 11.0 (L) 12.0 - 15.0 g/dL   HCT 32.9 (L) 36.0 - 46.0 %   MCV 90.6 80.0 - 100.0 fL   MCH 30.3 26.0 - 34.0 pg   MCHC 33.4 30.0 - 36.0 g/dL   RDW 12.2 11.5 - 15.5 %   Platelets 264 150 - 400 K/uL   nRBC 0.0 0.0 - 0.2 %    Comment: Performed at [facility], [place] [address] [place], [place] [address]  Troponin I (High Sensitivity)     Status: None   Collection Time: 01/22/19  2:50 PM  Result Value Ref Range   Troponin I (High Sensitivity) 5 <18 ng/L    Comment: (NOTE) Elevated high sensitivity troponin I (hsTnI) values and significant  changes across serial measurements may suggest ACS but many other  chronic and acute conditions are known to elevate hsTnI results.  Refer to the Links section for chest pain algorithms and  additional  guidance. Performed at [facility], [place] [address] [place], [place] [address]   Hepatic function panel  Status: None   Collection Time: 01/22/19  2:50 PM  Result Value Ref Range   Total Protein 6.7 6.5 - 8.1 g/dL   Albumin 3.6 3.5 - 5.0 g/dL   AST 26 15 - 41 U/L   ALT 36 0 - 44 U/L   Alkaline Phosphatase 73 38 - 126 U/L   Total Bilirubin 0.5 0.3 - 1.2 mg/dL   Bilirubin, Direct 0.1 0.0 - 0.2 mg/dL   Indirect Bilirubin 0.4 0.3 - 0.9 mg/dL    Comment: Performed at [facility] [address] [place], [place] [address]   Dg Chest 2 View  Result Date: 01/22/2019 CLINICAL DATA:  Chest pain EXAM: CHEST - 2 VIEW COMPARISON:  02/07/2012 FINDINGS: The lungs are clear without focal pneumonia, edema, pneumothorax or pleural effusion. Linear atelectasis or scarring at the bases, left greater than right, is similar to prior. Cardiopericardial silhouette is at upper limits of normal for size. The visualized bony structures of the thorax are intact. Telemetry leads overlie the chest. IMPRESSION: No active cardiopulmonary disease. Electronically Signed   By: Misty Stanley M.D.   On: 01/22/2019 15:56    Pending Labs Unresulted Labs (From admission, onward)    Start     Ordered   01/22/19 1702  SARS CORONAVIRUS 2 (TAT 6-24 HRS) Nasopharyngeal Nasopharyngeal Swab  (Asymptomatic/Tier 2 Patients Labs)  Once,   STAT    Question Answer Comment  Is this test for diagnosis or screening Screening   Symptomatic for COVID-19 as defined by CDC No   Hospitalized for COVID-19 No   Admitted to ICU for COVID-19 No   Previously tested for COVID-19 No   Resident in a congregate (group) care setting No   Employed in healthcare setting No   Pregnant No      01/22/19 1701          Vitals/Pain Today's Vitals   01/22/19 1736 01/22/19 1740 01/22/19 1745 01/22/19 1800  BP: (!) 141/82  (!) 136/54 (!) 119/103  Pulse: 91  89 93  Resp: 13  18 20   Temp:      TempSrc:      SpO2: 99%   97% 98%  Weight:      Height:      PainSc:  0-No pain      Isolation Precautions No active isolations  Medications Medications  sodium chloride flush (NS) 0.9 % injection 3 mL (has no administration in time range)  aspirin chewable tablet 324 mg (324 mg Oral Given 01/22/19 1456)    Mobility walks Low fall risk   Focused Assessments Cardiac Assessment Handoff:  Cardiac Rhythm: Normal sinus rhythm Lab Results  Component Value Date   CKTOTAL 26 03/19/2015   CKMB 0.9 01/22/2009   TROPONINI <0.30 02/07/2012   Lab Results  Component Value Date   DDIMER 0.82 (H) 02/06/2012   Does the Patient currently have chest pain? No     R Recommendations: See Admitting Provider Note  Report given to:   Additional Notes: Husband at bedside

## 2019-01-22 NOTE — ED Notes (Addendum)
Attempted to call report Admitting MD at bedside

## 2019-01-22 NOTE — ED Notes (Signed)
IV attempted x2 without success.

## 2019-01-22 NOTE — ED Notes (Signed)
CBG:90 

## 2019-01-22 NOTE — ED Triage Notes (Signed)
Pt c/o sudden onset chest pain approx 1 hour ago with radaition to right jaw and sweating. Denies shob or nausea.

## 2019-01-22 NOTE — ED Provider Notes (Signed)
[facility] EMERGENCY DEPARTMENT Provider Note   CSN: [id number] Arrival date & time: 01/22/19  1349     History   Chief Complaint Chief Complaint  Patient presents with  . Chest Pain    HPI Erin Roach is a 78 y.o. female.     Pt presents to the ED today with CP.  Pt said CP developed about 1 hr pta.  She broke out in a sweat and pain radiated to her jaw.  Pain is gone now.  She has never had anything like it in the past.     Past Medical History:  Diagnosis Date  . Allergic rhinitis   . Apnea   . Arthritis   . Asthma    very mild  . Back pain   . Bilateral lower extremity edema   . Carotid artery disease ([place])    a. Carotid duplex 03/2016 - duplex was stable, 71-24% RICA, 5-80% LICA  . Complication of anesthesia    post-op delirium  . Endometrial polyp   . Essential hypertension   . GERD (gastroesophageal reflux disease)   . Heart murmur    per pt  . History of adenomatous polyp of colon    04-24-2016  tubular adenoma  . History of squamous cell carcinoma excision    pilonidal area second excision 02-02-2002  . HTN (hypertension)   . Hyperlipidemia   . Leg pain   . OAB (overactive bladder)   . RA (rheumatoid arthritis) ([place])   . RLS (restless legs syndrome)   . Shortness of breath on exertion   . SUI (stress urinary incontinence, female)   . Venous insufficiency    legs  . Vitamin D deficiency     Patient Active Problem List   Diagnosis Date Noted  . Weakness of both lower extremities 01/02/2019  . Frequent falls 01/02/2019  . Cellulitis of right elbow 12/29/2018  . OSA on CPAP 06/15/2017  . Diabetes ([place]) 03/02/2017  . Herpes zoster without complication 99/83/3825  . Prediabetes 02/23/2017  . Shortness of breath on exertion 02/23/2017  . B12 nutritional deficiency 02/23/2017  . Vitamin D deficiency 12/08/2016  . Tubular adenoma of colon 04/29/2016  . Essential hypertension 09/12/2015  . Allergic rhinitis 07/17/2015  .  Insomnia 07/17/2015  . Obese 04/10/2015  . S/P left TKA 04/09/2015  . Osteopenia 12/04/2012  . 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 RESTLESS LEG SYNDROME 02/11/2009  . LEG EDEMA, BILATERAL 02/11/2009  . Hyperlipidemia 10/30/2008  . Urinary incontinence 10/30/2008  . Skin cancer 10/30/2008  . Asthma 11/21/2007  . Venous (peripheral) insufficiency 11/22/2006    Past Surgical History:  Procedure Laterality Date  . APPENDECTOMY  07-02-2005   [name]   open  . CARDIAC CATHETERIZATION  01-23-2009  dr Darnell Level brodie   normal coronary arteries and lvf  . CARDIOVASCULAR STRESS TEST  03/31/2016   Low risk nuclear study w/ medium defect of mild severity in basal anterior and mid anterior location w/ no evidence ischemia or infarction/  normal LV function and wall motion,  nuclear stress ef 71%  . CATARACT EXTRACTION W/ INTRAOCULAR LENS IMPLANT Left 09/2016  . COLONOSCOPY  last one 04-24-2016  . HYSTEROSCOPY W/D&C N/A 12/22/2016   Procedure: DILATATION AND CURETTAGE /HYSTEROSCOPY;  Surgeon: Dian Queen, MD;  Location: [facility];  Service: Gynecology;  Laterality: N/A;  . IR US GUIDANCE  09/08/2016  . POSTERIOR LUMBAR FUSION  02/ 2018    [facility] ([place], [place])  .  SKIN CANCER EXCISION    . TONSILLECTOMY  child  . 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 TRANSTHORACIC ECHOCARDIOGRAM  01/21/2009   mild LVH, ef 86-76%, grade 1 diastolic dysfunction  . ULNAR NERVE TRANSPOSITION Left 12-20-2008  dr sypher   decompression and partial resection medial triceps fascia  . WIDE LOCAL EXCISION PILONIDAL AREA  02-02-2002  [name]   recurrent squamous cell carcinoma in situ     OB History    Gravida  2   Para  2   Term  2   Preterm      AB      Living  2     SAB      TAB      Ectopic      Multiple      Live Births  2             Home Medications    Prior to Admission medications   Medication Sig Start Date End Date Taking? Authorizing Provider  albuterol (PROAIR HFA) 108 (90 Base) MCG/ACT inhaler Inhale 2 puffs into the lungs every 6 (six) hours as needed for wheezing or shortness of breath. 08/08/18   Binnie Rail, MD  amitriptyline (ELAVIL) 50 MG tablet Take 2 tablets (100 mg total) by mouth at bedtime. 01/02/19   Binnie Rail, MD  Blood Glucose Monitoring Suppl (ONE TOUCH ULTRA 2) w/Device KIT USE TO TEST BLOOD SUGARS UP TO 4 TIMES DAILY 08/01/18   Dennard Nip D, MD  budesonide-formoterol [facility]) 160-4.5 MCG/ACT inhaler Inhale 2 puffs into the lungs daily. 08/08/18   Binnie Rail, MD  Cholecalciferol (VITAMIN D) 2000 units tablet Take 1 tablet (2,000 Units total) by mouth daily. 03/09/17   Dennard Nip D, MD  fluticasone (FLONASE) 50 MCG/ACT nasal spray Place 1 spray into both nostrils daily. 12/13/18   Dennard Nip D, MD  furosemide (LASIX) 20 MG tablet TAKE ONE TABLET BY MOUTH DAILY 08/08/18   Binnie Rail, MD  glucose blood (ACCU-CHEK AVIVA) test strip Testing twice daily 11/16/18   Starlyn Skeans, MD  HORIZANT 600 MG [facility]  06/10/18   [provider]  HYDROcodone-acetaminophen (NORCO/VICODIN) 5-325 MG tablet Take 1 tablet by mouth at bedtime. Patient not taking: Reported on 01/10/2019 01/06/19   Binnie Rail, MD  Insulin Pen Needle (BD PEN NEEDLE NANO 2ND GEN) 32G X 4 MM MISC 1 Package by Does not apply route 2 (two) times daily. 11/16/18   Dennard Nip D, MD  liraglutide (VICTOZA) 18 MG/3ML SOPN DIAL AND INJECT SUBCUTANEOUSLY 1.8MG DAILY EVERY MORNING 12/13/18   Dennard Nip D, MD  losartan (COZAAR) 100 MG tablet Take 1 tablet (100 mg total) by mouth daily. -- Office visit needed for further refills 12/13/18   Dennard Nip D, MD  metFORMIN (GLUCOPHAGE) 500 MG tablet TAKE 1 TABLET BY MOUTH TWICE A DAY WITH A MEAL. 01/04/19   Dennard Nip D, MD  pramipexole (MIRAPEX) 0.25 MG tablet  Take 0.5 mg by mouth 2 (two) times daily. 1630  And  Bedtime    [provider]  pregabalin (LYRICA) 75 MG capsule TAKE ONE CAPSULE BY MOUTH EVERY AFTERNOON AND TAKE TWO CAPSULES BY MOUTH EVERY NIGHT AT BEDTIME 12/08/17   Burns, Claudina Lick, MD  simvastatin (ZOCOR) 40 MG tablet TAKE ONE TABLET BY MOUTH AT BEDTIME *OFFICE VISIT NEEDED  FOR FURTHER REFILLS* 01/12/19   Binnie Rail, MD  sulfamethoxazole-trimethoprim (BACTRIM DS) 800-160 MG tablet Take 1 tablet by mouth 2 (two) times daily. Patient not taking: Reported on 01/10/2019 12/29/18   Biagio Borg, MD  traMADol (ULTRAM) 50 MG tablet Take 1 tablet (50 mg total) by mouth every 6 (six) hours as needed. Patient not taking: Reported on 01/10/2019 12/29/18   Biagio Borg, MD  Vitamin D, Ergocalciferol, (DRISDOL) 1.25 MG (50000 UT) CAPS capsule Take 1 capsule (50,000 Units total) by mouth every 7 (seven) days. Patient not taking: Reported on 01/10/2019 01/04/19   Starlyn Skeans, MD    Family History Family History  Problem Relation Age of Onset  . Esophageal cancer Father   . Heart disease Father        Unknown what kind  . Hyperlipidemia Father   . Hypertension Father   . Cancer Father   . Heart failure Father   . Hyperlipidemia Mother   . Hypertension Mother   . CAD Sister        58 years younger than patient - had stent. Was told that her anorexia/bulimia may have played a role  . Stroke Maternal Grandmother 66  . Diabetes Maternal Grandmother   . Heart attack Maternal Grandfather 93  . COPD Maternal Grandfather   . Heart attack Paternal Grandmother        70s  . Diabetes Other        [place]    Social History Social History   Tobacco Use  . Smoking status: Former Smoker    Packs/day: 0.50    Years: 10.00    Pack years: 5.00    Types: Cigarettes    Quit date: 12/10/1973    Years since quitting: 45.1  . Smokeless tobacco: Never Used  Substance Use Topics  . Alcohol use: Yes    Comment: occasional  . Drug use: No      Allergies   Patient has no known allergies.   Review of Systems Review of Systems  Cardiovascular: Positive for chest pain.  All other systems reviewed and are negative.    Physical Exam Updated Vital Signs BP (!) 154/65 (BP Location: Right Arm)   Pulse 86   Temp 98.7 F (37.1 C) (Oral)   Resp 16   Ht _0  (1.676 m)   Wt 90.7 kg   SpO2 99%   BMI 32.28 kg/m   Physical Exam Vitals signs and nursing note reviewed.  Constitutional:      Appearance: She is well-developed.  HENT:     Head: Normocephalic and atraumatic.  Eyes:     Extraocular Movements: Extraocular movements intact.     Pupils: Pupils are equal, round, and reactive to light.  Neck:     Musculoskeletal: Normal range of motion and neck supple.  Cardiovascular:     Rate and Rhythm: Normal rate and regular rhythm.     Heart sounds: Normal heart sounds.  Pulmonary:     Effort: Pulmonary effort is normal.     Breath sounds: Normal breath sounds.  Abdominal:     General: Bowel sounds are normal.     Palpations: Abdomen is soft.  Musculoskeletal: Normal range of motion.     Right lower leg: Edema present.     Left lower leg: Edema present.  Skin:    General: Skin is warm.     Capillary Refill: Capillary refill takes less than 2 seconds.  Neurological:     General:  No focal deficit present.     Mental Status: She is alert and oriented to person, place, and time.  Psychiatric:        Mood and Affect: Mood normal.        Behavior: Behavior normal.      ED Treatments / Results  Labs (all labs ordered are listed, but only abnormal results are displayed) Labs Reviewed  CBC - Abnormal; Notable for the following components:      Result Value   RBC 3.63 (*)    Hemoglobin 11.0 (*)    HCT 32.9 (*)    All other components within normal limits  BASIC METABOLIC PANEL  HEPATIC FUNCTION PANEL  CBG MONITORING, ED  TROPONIN I (HIGH SENSITIVITY)    EKG EKG Interpretation  Date/Time:  Sunday January 22 2019 13:58:29 EDT Ventricular Rate:  92 PR Interval:    QRS Duration: 143 QT Interval:  403 QTC Calculation: 499 R Axis:   -74 Text Interpretation:  Sinus rhythm Right atrial enlargement RBBB and LAFB Probable left ventricular hypertrophy No significant change since last tracing Confirmed by Isla Pence [phone]) on 01/22/2019 3:09:28 PM   Radiology No results found.  Procedures Procedures (including critical care time)  Medications Ordered in ED Medications  sodium chloride flush (NS) 0.9 % injection 3 mL (has no administration in time range)  aspirin chewable tablet 324 mg (324 mg Oral Given 01/22/19 1456)     Initial Impression / Assessment and Plan / ED Course  I have reviewed the triage vital signs and the nursing notes.  Pertinent labs & imaging results that were available during my care of the patient were reviewed by me and considered in my medical decision making (see chart for details).       Labs pending at shift change.  Pt has multiple risk factors for CAD.  Pt signed out to Dr. Maryan Rued at shift change.  Final Clinical Impressions(s) / ED Diagnoses   Final diagnoses:  Chest pain, unspecified type    ED Discharge Orders    None       Isla Pence, MD 01/22/19 1529

## 2019-01-22 NOTE — H&P (Addendum)
History and Physical    Erin Roach AXK:[id number] DOB: [birth year] DOA: 01/22/2019  PCP: Binnie Rail, MD  Patient coming from: home.   I have personally briefly reviewed patient's old medical records in [place]  Chief Complaint: chest pain around 12 pm.   HPI: Erin Roach is a 78 y.o. female with medical history significant of hypertension, DM, RLS, asthma, arthritis, hyperlipidemia,bilateral carotid disease, Chronic lower extremity edema, presents to ED today for chest pain occurred at rest lasted for 10 minutes. Chest pain is precordial, non radiating but associated with right jaw pain lasted for 15 minutes and diaphoresis.  Chest pain resolved spontaneously. Not associated with nausea, vomiting, abdominal pain, diarrhea, dysuria. Pt reports about a month ago, her elavil was increased to BID, and since then she had repeated episodes of lightheadedness and multiple falls. She also reports one episode of syncope, after which her elavil dose was changed. Since then she had no more episodes of lightheadedness and falls.  She denies any tingling or numbness of the extremities, no headaches .  She has chronic pedal edema from venous insufficiency. She denies any hematochezia, hematemesis .   She had cardiac cath in 2010 with normal coronaries. She also had a recent stress test in 2017.    ED Course: on arrival to ED, she was afebrile, tachycardic, tachypneic, hypertensive. Labs were remarkable for creatinine of 1, and hemoglobin of 11. troponin's were negative.  COVID 19 SCREEN IS pending.  EKG shows sinus rhythm, right atrial enlargement, right bundle branch block, left ventricular hypertrophy.   In view of her multiple risk factors, heart score of 6, she was referred for admission for evaluation of ACS.   Review of Systems: As per HPI otherwise  "All others reviewed and are negative," .  Past Medical History:  Diagnosis Date  . Allergic rhinitis   . Apnea   .  Arthritis   . Asthma    very mild  . Back pain   . Bilateral lower extremity edema   . Carotid artery disease ([place])    a. Carotid duplex 03/2016 - duplex was stable, 78-67% RICA, 5-44% LICA  . Complication of anesthesia    post-op delirium  . Endometrial polyp   . Essential hypertension   . GERD (gastroesophageal reflux disease)   . Heart murmur    per pt  . History of adenomatous polyp of colon    04-24-2016  tubular adenoma  . History of squamous cell carcinoma excision    pilonidal area second excision 02-02-2002  . HTN (hypertension)   . Hyperlipidemia   . Leg pain   . OAB (overactive bladder)   . RA (rheumatoid arthritis) ([place])   . RLS (restless legs syndrome)   . Shortness of breath on exertion   . SUI (stress urinary incontinence, female)   . Venous insufficiency    legs  . Vitamin D deficiency     Past Surgical History:  Procedure Laterality Date  . APPENDECTOMY  07-02-2005   [name]   open  . CARDIAC CATHETERIZATION  01-23-2009  dr Darnell Level brodie   normal coronary arteries and lvf  . CARDIOVASCULAR STRESS TEST  03/31/2016   Low risk nuclear study w/ medium defect of mild severity in basal anterior and mid anterior location w/ no evidence ischemia or infarction/  normal LV function and wall motion,  nuclear stress ef 71%  . CATARACT EXTRACTION W/ INTRAOCULAR LENS IMPLANT Left 09/2016  . COLONOSCOPY  last one  04-24-2016  . HYSTEROSCOPY W/D&C N/A 12/22/2016   Procedure: DILATATION AND CURETTAGE /HYSTEROSCOPY;  Surgeon: Dian Queen, MD;  [facility];  Service: Gynecology;  Laterality: N/A;  . IR US GUIDANCE  09/08/2016  . POSTERIOR LUMBAR FUSION  02/ 2018    [facility] ([place])  . SKIN CANCER EXCISION    . TONSILLECTOMY  child  . 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 TRANSTHORACIC  ECHOCARDIOGRAM  01/21/2009   mild LVH, ef 34-19%, grade 1 diastolic dysfunction  . ULNAR NERVE TRANSPOSITION Left 12-20-2008  dr sypher   decompression and partial resection medial triceps fascia  . WIDE LOCAL EXCISION PILONIDAL AREA  02-02-2002  [name]   recurrent squamous cell carcinoma in situ   SOCIAL HIstory.   reports that she quit smoking about 45 years ago. Her smoking use included cigarettes. She has a 5.00 pack-year smoking history. She has never used smokeless tobacco. She reports current alcohol use. She reports that she does not use drugs.  No Known Allergies  Family History  Problem Relation Age of Onset  . Esophageal cancer Father   . Heart disease Father        Unknown what kind  . Hyperlipidemia Father   . Hypertension Father   . Cancer Father   . Heart failure Father   . Hyperlipidemia Mother   . Hypertension Mother   . CAD Sister        18 years younger than patient - had stent. Was told that her anorexia/bulimia may have played a role  . Stroke Maternal Grandmother 66  . Diabetes Maternal Grandmother   . Heart attack Maternal Grandfather 93  . COPD Maternal Grandfather   . Heart attack Paternal Grandmother        34s  . Diabetes Other        [place]   Family history reviewed and pertinent.  Prior to Admission medications   Medication Sig Start Date End Date Taking? Authorizing Provider  albuterol (PROAIR HFA) 108 (90 Base) MCG/ACT inhaler Inhale 2 puffs into the lungs every 6 (six) hours as needed for wheezing or shortness of breath. 08/08/18  Yes Burns, Claudina Lick, MD  amitriptyline (ELAVIL) 50 MG tablet Take 2 tablets (100 mg total) by mouth at bedtime. Patient taking differently: Take 50 mg by mouth at bedtime.  01/02/19  Yes Burns, Claudina Lick, MD  Blood Glucose Monitoring Suppl (ONE TOUCH ULTRA 2) w/Device KIT USE TO TEST BLOOD SUGARS UP TO 4 TIMES DAILY 08/01/18  Yes Leafy Ro, Caren D, MD  budesonide-formoterol [facility]) 160-4.5 MCG/ACT inhaler Inhale 2  puffs into the lungs daily. 08/08/18  Yes Burns, Claudina Lick, MD  ergocalciferol (VITAMIN D2) 1.25 MG (50000 UT) capsule Take 50,000 Units by mouth once a week. Take one tablet by mouth on Mondays   Yes [provider]  fluticasone (FLONASE) 50 MCG/ACT nasal spray Place 1 spray into both nostrils daily. Patient taking differently: Place 1 spray into both nostrils daily as needed for allergies.  12/13/18  Yes Beasley, Caren D, MD  furosemide (LASIX) 20 MG tablet TAKE ONE TABLET BY MOUTH DAILY Patient taking differently: Take 40 mg by mouth daily.  08/08/18  Yes Burns, Claudina Lick, MD  HORIZANT 600 MG TBCR Take 1 tablet by mouth at bedtime.  06/10/18  Yes [provider]  Insulin Pen Needle (BD  PEN NEEDLE NANO 2ND GEN) 32G X 4 MM MISC 1 Package by Does not apply route 2 (two) times daily. 11/16/18  Yes Beasley, Caren D, MD  liraglutide (VICTOZA) 18 MG/3ML SOPN DIAL AND INJECT SUBCUTANEOUSLY 1.8MG DAILY EVERY MORNING Patient taking differently: Inject 1.8 mg into the skin every evening.  12/13/18  Yes Beasley, Caren D, MD  losartan (COZAAR) 100 MG tablet Take 1 tablet (100 mg total) by mouth daily. -- Office visit needed for further refills 12/13/18  Yes Leafy Ro, Caren D, MD  metFORMIN (GLUCOPHAGE) 500 MG tablet TAKE 1 TABLET BY MOUTH TWICE A DAY WITH A MEAL. Patient taking differently: Take 500 mg by mouth daily with breakfast.  01/04/19  Yes Leafy Ro, Caren D, MD  pramipexole (MIRAPEX) 0.25 MG tablet Take 0.25 mg by mouth daily. 1630  And  Bedtime    Yes [provider]  pregabalin (LYRICA) 75 MG capsule TAKE ONE CAPSULE BY MOUTH EVERY AFTERNOON AND TAKE TWO CAPSULES BY MOUTH EVERY NIGHT AT BEDTIME Patient taking differently: Take 75 mg by mouth every evening.  12/08/17  Yes Burns, Claudina Lick, MD  simvastatin (ZOCOR) 40 MG tablet TAKE ONE TABLET BY MOUTH AT BEDTIME *OFFICE VISIT NEEDED FOR FURTHER REFILLS* Patient taking differently: Take 40 mg by mouth at bedtime.  01/12/19  Yes Binnie Rail, MD   glucose blood (ACCU-CHEK AVIVA) test strip Testing twice daily 11/16/18   Dennard Nip D, MD    Physical Exam: Vitals:   01/22/19 1736 01/22/19 1745 01/22/19 1800 01/22/19 1815  BP: (!) 141/82 (!) 136/54 (!) 119/103 (!) 149/66  Pulse: 91 89 93 95  Resp: '13 18 20 ' (!) 21  Temp:      TempSrc:      SpO2: 99% 97% 98% 99%  Weight:      Height:        Constitutional: NAD, calm, comfortable Vitals:   01/22/19 1736 01/22/19 1745 01/22/19 1800 01/22/19 1815  BP: (!) 141/82 (!) 136/54 (!) 119/103 (!) 149/66  Pulse: 91 89 93 95  Resp: '13 18 20 ' (!) 21  Temp:      TempSrc:      SpO2: 99% 97% 98% 99%  Weight:      Height:       Eyes: PERRL, lids and conjunctivae normal ENMT: Mucous membranes are moist. Neck: normal, supple, carotid bruit heard bilateral.  Respiratory: clear to auscultation bilaterally, no wheezing, no crackles. Slight tachypnea present.  Cardiovascular: Regular rate and rhythm, S1S2 HEARD, no JVD. Abdomen: no tenderness, no masses palpated. No hepatosplenomegaly. Bowel sounds positive.  Musculoskeletal: no clubbing / cyanosis. Bilateral leg edema present.  Skin: no rashes, lesions, ulcers.  Neurologic: CN 2-12 grossly intact. Sensation intact, Strength 5/5 in all 4.  Psychiatric: Normal judgment and insight. Alert and oriented x 3. Normal mood.     Labs on Admission: I have personally reviewed following labs and imaging studies  CBC: Recent Labs  Lab 01/22/19 1450  WBC 7.8  HGB 11.0*  HCT 32.9*  MCV 90.6  PLT 786   Basic Metabolic Panel: Recent Labs  Lab 01/22/19 1450  NA 140  K 4.3  CL 104  CO2 26  GLUCOSE 99  BUN 18  CREATININE 1.03*  CALCIUM 8.8*   GFR: Estimated Creatinine Clearance: 52.7 mL/min (A) (by C-G formula based on SCr of 1.03 mg/dL (H)). Liver Function Tests: Recent Labs  Lab 01/22/19 1450  AST 26  ALT 36  ALKPHOS 73  BILITOT 0.5  PROT 6.7  ALBUMIN 3.6   No results for input(s): LIPASE, AMYLASE in the last 168 hours. No  results for input(s): AMMONIA in the last 168 hours. Coagulation Profile: No results for input(s): INR, PROTIME in the last 168 hours. Cardiac Enzymes: No results for input(s): CKTOTAL, CKMB, CKMBINDEX, TROPONINI in the last 168 hours. BNP (last 3 results) No results for input(s): PROBNP in the last 8760 hours. HbA1C: No results for input(s): HGBA1C in the last 72 hours. CBG: Recent Labs  Lab 01/22/19 1418  GLUCAP 90   Lipid Profile: No results for input(s): CHOL, HDL, LDLCALC, TRIG, CHOLHDL, LDLDIRECT in the last 72 hours. Thyroid Function Tests: No results for input(s): TSH, T4TOTAL, FREET4, T3FREE, THYROIDAB in the last 72 hours. Anemia Panel: No results for input(s): VITAMINB12, FOLATE, FERRITIN, TIBC, IRON, RETICCTPCT in the last 72 hours. Urine analysis:    Component Value Date/Time   COLORURINE YELLOW 03/27/2015 [place] 03/27/2015 1358   LABSPEC 1.014 03/27/2015 1358   PHURINE 6.0 03/27/2015 1358   GLUCOSEU NEGATIVE 03/27/2015 1358   HGBUR NEGATIVE 03/27/2015 [place] 03/27/2015 1358   BILIRUBINUR Neg 09/27/2012 1702   KETONESUR NEGATIVE 03/27/2015 [place] 03/27/2015 1358   UROBILINOGEN 0.2 09/27/2012 1702   NITRITE NEGATIVE 03/27/2015 1358   LEUKOCYTESUR TRACE (A) 03/27/2015 1358    Radiological Exams on Admission: Dg Chest 2 View  Result Date: 01/22/2019 CLINICAL DATA:  Chest pain EXAM: CHEST - 2 VIEW COMPARISON:  02/07/2012 FINDINGS: The lungs are clear without focal pneumonia, edema, pneumothorax or pleural effusion. Linear atelectasis or scarring at the bases, left greater than right, is similar to prior. Cardiopericardial silhouette is at upper limits of normal for size. The visualized bony structures of the thorax are intact. Telemetry leads overlie the chest. IMPRESSION: No active cardiopulmonary disease. Electronically Signed   By: Misty Stanley M.D.   On: 01/22/2019 15:56    EKG: Independently  reviewed. Sinus rhythm, with right bundle branch block. Right atrial enlargement.   Assessment/Plan Active Problems:   Chest pain  Chest pain:  Atypical in nature. Differential ACS, VS GERD vs Musculoskeletal.  occurred at rest, associated with right jaw pain,  resolved spontaneously.not associated with nausea or sob.  Atypical in nature. ACS ruled out with negative troponin EKG does not show any ischemic changes. CXR does not show any acute cardiopulm disease.   Echocardiogram ordered. Aspirin 324 mg given in ED.  Resume aspirin 81 mg daily.  Npo after midnight for possible stress test in am.  Cardiology will be consulted.  Repeat EKG in am.   GERD:  No symptoms of reflux as per the patient.  Added protonix.    Carotid artery disease:  Carotid duplex ordered.    Hypertensive urgency:  Sub optimal. Restart home meds.  Add prn hydralazine.   RLS; Resume home meds.    Mild normocytic anemia:  Hemoglobin around 11.  Get anemia panel.    Hyperlipidemia:  Get lipid panel in am and resume statin.    Asthma:  No wheezing heard.  Resume dulera and albuterol.   Diabetes Mellitus:  Type 2.  CBG (last 3)  Recent Labs    01/22/19 1418  GLUCAP 90   Hold oral meds.  A1c is less than 6.5 and well controlled cbgs.     Severity of Illness: The appropriate patient status for this patient is OBSERVATION. Observation status is judged to be reasonable and necessary in order to provide the required intensity  of service to ensure the patient's safety. The patient's presenting symptoms, physical exam findings, and initial radiographic and laboratory data in the context of their medical condition is felt to place them at decreased risk for further clinical deterioration. Furthermore, it is anticipated that the patient will be medically stable for discharge from the hospital within 2 midnights of admission.    DVT prophylaxis: lovenox.  Code Status:full code.  Family  Communication: husband at bedside.  Disposition Plan: pending further work up.  Consults called: cardiology consulted via staff message.  Admission status: obs tele   Hosie Poisson MD [facility] Pager [phone]  If 7PM-7AM, please contact night-coverage [email or web address] Password [facility]  01/22/2019, 6:27 PM

## 2019-01-23 ENCOUNTER — Ambulatory Visit (HOSPITAL_BASED_OUTPATIENT_CLINIC_OR_DEPARTMENT_OTHER): Payer: Medicare Other

## 2019-01-23 ENCOUNTER — Encounter (HOSPITAL_COMMUNITY): Payer: Self-pay | Admitting: Internal Medicine

## 2019-01-23 ENCOUNTER — Observation Stay (HOSPITAL_BASED_OUTPATIENT_CLINIC_OR_DEPARTMENT_OTHER): Payer: Medicare Other

## 2019-01-23 ENCOUNTER — Encounter: Payer: Self-pay | Admitting: Internal Medicine

## 2019-01-23 DIAGNOSIS — E7800 Pure hypercholesterolemia, unspecified: Secondary | ICD-10-CM | POA: Diagnosis not present

## 2019-01-23 DIAGNOSIS — I259 Chronic ischemic heart disease, unspecified: Secondary | ICD-10-CM | POA: Diagnosis not present

## 2019-01-23 DIAGNOSIS — I452 Bifascicular block: Secondary | ICD-10-CM | POA: Diagnosis not present

## 2019-01-23 DIAGNOSIS — R079 Chest pain, unspecified: Secondary | ICD-10-CM

## 2019-01-23 DIAGNOSIS — R072 Precordial pain: Secondary | ICD-10-CM | POA: Diagnosis not present

## 2019-01-23 DIAGNOSIS — I739 Peripheral vascular disease, unspecified: Secondary | ICD-10-CM

## 2019-01-23 DIAGNOSIS — E1169 Type 2 diabetes mellitus with other specified complication: Secondary | ICD-10-CM

## 2019-01-23 DIAGNOSIS — J45909 Unspecified asthma, uncomplicated: Secondary | ICD-10-CM | POA: Diagnosis not present

## 2019-01-23 DIAGNOSIS — E669 Obesity, unspecified: Secondary | ICD-10-CM

## 2019-01-23 DIAGNOSIS — I6521 Occlusion and stenosis of right carotid artery: Secondary | ICD-10-CM

## 2019-01-23 DIAGNOSIS — Z20828 Contact with and (suspected) exposure to other viral communicable diseases: Secondary | ICD-10-CM | POA: Diagnosis not present

## 2019-01-23 DIAGNOSIS — R52 Pain, unspecified: Secondary | ICD-10-CM

## 2019-01-23 DIAGNOSIS — I10 Essential (primary) hypertension: Secondary | ICD-10-CM

## 2019-01-23 DIAGNOSIS — R0789 Other chest pain: Secondary | ICD-10-CM | POA: Diagnosis not present

## 2019-01-23 DIAGNOSIS — I779 Disorder of arteries and arterioles, unspecified: Secondary | ICD-10-CM | POA: Insufficient documentation

## 2019-01-23 LAB — BASIC METABOLIC PANEL
Anion gap: 10 (ref 5–15)
BUN: 16 mg/dL (ref 8–23)
CO2: 25 mmol/L (ref 22–32)
Calcium: 8.6 mg/dL — ABNORMAL LOW (ref 8.9–10.3)
Chloride: 105 mmol/L (ref 98–111)
Creatinine, Ser: 0.86 mg/dL (ref 0.44–1.00)
GFR calc Af Amer: >60 mL/min (ref >60)
GFR calc non Af Amer: >60 mL/min (ref >60)
Glucose, Bld: 95 mg/dL (ref 70–99)
Potassium: 4 mmol/L (ref 3.5–5.1)
Sodium: 140 mmol/L (ref 135–145)

## 2019-01-23 LAB — CBC
HCT: 32.8 % — ABNORMAL LOW (ref 36.0–46.0)
Hemoglobin: 11 g/dL — ABNORMAL LOW (ref 12.0–15.0)
MCH: 30.1 pg (ref 26.0–34.0)
MCHC: 33.5 g/dL (ref 30.0–36.0)
MCV: 89.6 fL (ref 80.0–100.0)
Platelets: 250 K/uL (ref 150–400)
RBC: 3.66 MIL/uL — ABNORMAL LOW (ref 3.87–5.11)
RDW: 12.2 % (ref 11.5–15.5)
WBC: 6.2 K/uL (ref 4.0–10.5)
nRBC: 0 % (ref 0.0–0.2)

## 2019-01-23 LAB — LIPID PANEL
Cholesterol: 192 mg/dL (ref 0–200)
HDL: 45 mg/dL
LDL Cholesterol: 91 mg/dL (ref 0–99)
Total CHOL/HDL Ratio: 4.3 ratio
Triglycerides: 279 mg/dL — ABNORMAL HIGH
VLDL: 56 mg/dL — ABNORMAL HIGH (ref 0–40)

## 2019-01-23 LAB — GLUCOSE, CAPILLARY
Glucose-Capillary: 95 mg/dL (ref 70–99)
Glucose-Capillary: 142 mg/dL — ABNORMAL HIGH (ref 70–99)

## 2019-01-23 LAB — ECHOCARDIOGRAM COMPLETE
Height: 66 in
Weight: 3244.8 oz

## 2019-01-23 MED ORDER — OMEPRAZOLE 10 MG PO CPDR: 10.0000 mg | DELAYED_RELEASE_CAPSULE | Freq: Every day | ORAL | 1 refills | Status: DC

## 2019-01-23 MED FILL — OMEPRAZOLE 20 MG CPDR: 20 | 30 days supply | Qty: 30 | Fill #0

## 2019-01-23 NOTE — Consult Note (Addendum)
Cardiology Consultation:   Patient ID: BOWEN GOYAL MRN: [id number]; DOB: [birth year]  Admit date: 01/22/2019 Date of Consult: 01/23/2019  Primary Care Provider: Binnie Rail, MD Primary Cardiologist: Dr. Angelena Form Primary Electrophysiologist:  None    Patient Profile:   Erin Roach is a 78 y.o. female with a hx of HTN, DM, HLD, PVD (bilateral carotid; RICA 58-52% and LICA 7-78%), and chronic LE edema who is being seen today for the evaluation of chest pain.  History of Present Illness:   Ms. Scroggin was in her usual state of health until yesterday afternoon between 12-1pm when she developed sharp left-sided chest pain that radiated to her right chest and right jaw. It was associated with diaphoresis. The whole episode last 10-15 minutes before complete resolution. She did ambulate during this episode and states that the pain remained unchanged. She did not take anything for the pain and has never had similar pain before. She did have an episode of chest pain >10 years ago and she was told it was due to GERD.   Overnight she did have a recurrence of her chest pain 1-2 times. States that it was very sharp and slowly dissipated over 5 minutes. She was again resting in bed. She did not noticed any worsening with position change or deep inspiration. At baseline she is retired and able to walk her dog without associated CP or SHOB. She also has no issues walking around the grocery store. Her father was diagnosed with CAD in his late 72s and she has no family history of early heart disease.   She denies SHOB, N/V, abdominal pain, cough, recent URI, diarrhea, new LE swelling/edema, recent prolonged immobility.   In the ED her Hs-troponin was negative x2 and D-dimer elevated at 1.45. Given her high heart score she was admitted for further evaluation and management.   Heart Pathway Score:    6  Past Medical History:  Diagnosis Date  . Allergic rhinitis   . Apnea   . Arthritis   . Asthma     very mild  . Back pain   . Bilateral lower extremity edema   . Carotid artery disease ([place])    a. Carotid duplex 03/2016 - duplex was stable, 24-23% RICA, 5-36% LICA  . Complication of anesthesia    post-op delirium  . Endometrial polyp   . Essential hypertension   . GERD (gastroesophageal reflux disease)   . Heart murmur    per pt  . History of adenomatous polyp of colon    04-24-2016  tubular adenoma  . History of squamous cell carcinoma excision    pilonidal area second excision 02-02-2002  . HTN (hypertension)   . Hyperlipidemia   . Leg pain   . OAB (overactive bladder)   . RA (rheumatoid arthritis) ([place])   . RLS (restless legs syndrome)   . Shortness of breath on exertion   . SUI (stress urinary incontinence, female)   . Venous insufficiency    legs  . Vitamin D deficiency    Past Surgical History:  Procedure Laterality Date  . APPENDECTOMY  07-02-2005   [name]   open  . CARDIAC CATHETERIZATION  01-23-2009  dr Darnell Level brodie   normal coronary arteries and lvf  . CARDIOVASCULAR STRESS TEST  03/31/2016   Low risk nuclear study w/ medium defect of mild severity in basal anterior and mid anterior location w/ no evidence ischemia or infarction/  normal LV function and wall motion,  nuclear stress ef  71%  . CATARACT EXTRACTION W/ INTRAOCULAR LENS IMPLANT Left 09/2016  . COLONOSCOPY  last one 04-24-2016  . HYSTEROSCOPY W/D&C N/A 12/22/2016   Procedure: DILATATION AND CURETTAGE /HYSTEROSCOPY;  Surgeon: Dian Queen, MD;  Location: [facility];  Service: Gynecology;  Laterality: N/A;  . IR US GUIDANCE  09/08/2016  . POSTERIOR LUMBAR FUSION  02/ 2018    [facility] ([place])  . SKIN CANCER EXCISION    . TONSILLECTOMY  child  . 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 TRANSTHORACIC ECHOCARDIOGRAM  01/21/2009    mild LVH, ef 02-63%, grade 1 diastolic dysfunction  . ULNAR NERVE TRANSPOSITION Left 12-20-2008  dr sypher   decompression and partial resection medial triceps fascia  . WIDE LOCAL EXCISION PILONIDAL AREA  02-02-2002  [name]   recurrent squamous cell carcinoma in situ    Home Medications:  Prior to Admission medications   Medication Sig Start Date End Date Taking? Authorizing Provider  albuterol (PROAIR HFA) 108 (90 Base) MCG/ACT inhaler Inhale 2 puffs into the lungs every 6 (six) hours as needed for wheezing or shortness of breath. 08/08/18  Yes Burns, Claudina Lick, MD  amitriptyline (ELAVIL) 50 MG tablet Take 2 tablets (100 mg total) by mouth at bedtime. Patient taking differently: Take 50 mg by mouth at bedtime.  01/02/19  Yes Burns, Claudina Lick, MD  Blood Glucose Monitoring Suppl (ONE TOUCH ULTRA 2) w/Device KIT USE TO TEST BLOOD SUGARS UP TO 4 TIMES DAILY 08/01/18  Yes Leafy Ro, Caren D, MD  budesonide-formoterol [facility]) 160-4.5 MCG/ACT inhaler Inhale 2 puffs into the lungs daily. 08/08/18  Yes Burns, Claudina Lick, MD  ergocalciferol (VITAMIN D2) 1.25 MG (50000 UT) capsule Take 50,000 Units by mouth once a week. Take one tablet by mouth on Mondays   Yes [provider]  fluticasone (FLONASE) 50 MCG/ACT nasal spray Place 1 spray into both nostrils daily. Patient taking differently: Place 1 spray into both nostrils daily as needed for allergies.  12/13/18  Yes Beasley, Caren D, MD  furosemide (LASIX) 20 MG tablet TAKE ONE TABLET BY MOUTH DAILY Patient taking differently: Take 40 mg by mouth daily.  08/08/18  Yes Burns, Claudina Lick, MD  HORIZANT 600 MG TBCR Take 1 tablet by mouth at bedtime.  06/10/18  Yes [provider]  Insulin Pen Needle (BD PEN NEEDLE NANO 2ND GEN) 32G X 4 MM MISC 1 Package by Does not apply route 2 (two) times daily. 11/16/18  Yes Beasley, Caren D, MD  liraglutide (VICTOZA) 18 MG/3ML SOPN DIAL AND INJECT SUBCUTANEOUSLY 1.8MG DAILY EVERY MORNING Patient taking  differently: Inject 1.8 mg into the skin every evening.  12/13/18  Yes Beasley, Caren D, MD  losartan (COZAAR) 100 MG tablet Take 1 tablet (100 mg total) by mouth daily. -- Office visit needed for further refills 12/13/18  Yes Leafy Ro, Caren D, MD  metFORMIN (GLUCOPHAGE) 500 MG tablet TAKE 1 TABLET BY MOUTH TWICE A DAY WITH A MEAL. Patient taking differently: Take 500 mg by mouth daily with breakfast.  01/04/19  Yes Leafy Ro, Caren D, MD  pramipexole (MIRAPEX) 0.25 MG tablet Take 0.25 mg by mouth daily. 1630  And  Bedtime    Yes [provider]  pregabalin (LYRICA) 75 MG capsule TAKE ONE CAPSULE BY MOUTH EVERY AFTERNOON AND TAKE TWO CAPSULES BY MOUTH EVERY NIGHT AT BEDTIME Patient taking differently:  Take 75 mg by mouth every evening.  12/08/17  Yes Burns, Claudina Lick, MD  simvastatin (ZOCOR) 40 MG tablet TAKE ONE TABLET BY MOUTH AT BEDTIME *OFFICE VISIT NEEDED FOR FURTHER REFILLS* Patient taking differently: Take 40 mg by mouth at bedtime.  01/12/19  Yes Burns, Claudina Lick, MD  glucose blood (ACCU-CHEK AVIVA) test strip Testing twice daily 11/16/18   Dennard Nip D, MD   Inpatient Medications: Scheduled Meds: . amitriptyline  50 mg Oral QHS  . aspirin EC  81 mg Oral Daily  . enoxaparin (LOVENOX) injection  40 mg Subcutaneous Q24H  . furosemide  20 mg Oral Daily  . insulin aspart  0-5 Units Subcutaneous QHS  . insulin aspart  0-9 Units Subcutaneous TID WC  . losartan  100 mg Oral Daily  . mometasone-formoterol  2 puff Inhalation BID  . pantoprazole  40 mg Oral Q0600  . pramipexole  0.25 mg Oral BID  . pregabalin  75 mg Oral QPM  . simvastatin  40 mg Oral QHS  . sodium chloride flush  3 mL Intravenous Once   Continuous Infusions:  PRN Meds: acetaminophen, albuterol, fluticasone, hydrALAZINE, nitroGLYCERIN, ondansetron (ZOFRAN) IV  Allergies:   No Known Allergies  Social History:   Social History   Socioeconomic History  . Marital status: Married    Spouse name: Clare Gandy  . Number of  children: 2  . Years of education: Not on file  . Highest education level: Not on file  Occupational History  . Occupation: retired Tour manager  . Financial resource strain: Not hard at all  . Food insecurity    Worry: Never true    Inability: Never true  . Transportation needs    Medical: No    Non-medical: No  Tobacco Use  . Smoking status: Former Smoker    Packs/day: 0.50    Years: 10.00    Pack years: 5.00    Types: Cigarettes    Quit date: 12/10/1973    Years since quitting: 45.1  . Smokeless tobacco: Never Used  Substance and Sexual Activity  . Alcohol use: Yes    Comment: occasional  . Drug use: No  . Sexual activity: Not on file  Lifestyle  . Physical activity    Days per week: 3 days    Minutes per session: 40 min  . Stress: Only a little  Relationships  . Social connections    Talks on phone: More than three times a week    Gets together: More than three times a week    Attends religious service: Not on file    Active member of club or organization: Not on file    Attends meetings of clubs or organizations: Not on file    Relationship status: Married  . Intimate partner violence    Fear of current or ex partner: No    Emotionally abused: No    Physically abused: No    Forced sexual activity: No  Other Topics Concern  . Not on file  Social History Narrative   2 children ages 38,35    Family History:   Family History  Problem Relation Age of Onset  . Esophageal cancer Father   . Heart disease Father        Unknown what kind  . Hyperlipidemia Father   . Hypertension Father   . Cancer Father   . Heart failure Father   . Hyperlipidemia Mother   . Hypertension Mother   . CAD Sister  10 years younger than patient - had stent. Was told that her anorexia/bulimia may have played a role  . Stroke Maternal Grandmother 66  . Diabetes Maternal Grandmother   . Heart attack Maternal Grandfather 93  . COPD Maternal Grandfather   . Heart  attack Paternal Grandmother        30s  . Diabetes Other        MGGM    ROS:  Please see the history of present illness.  All other ROS reviewed and negative.     Physical Exam/Data:   Vitals:   01/22/19 1815 01/22/19 1846 01/22/19 1931 01/23/19 0400  BP: (!) 149/66 (!) 171/79 98/74 139/71  Pulse: 95 (!) 101 99   Resp: (!) '21 18 19   ' Temp:  98.7 F (37.1 C) 98.4 F (36.9 C) 98.2 F (36.8 C)  TempSrc:  Oral Oral Oral  SpO2: 99% 100% 99% 99%  Weight:  92 kg    Height:  '5\' 6"'  (1.676 m)      Intake/Output Summary (Last 24 hours) at 01/23/2019 0906 Last data filed at 01/22/2019 2100 Gross per 24 hour  Intake 360 ml  Output -  Net 360 ml   Last 3 Weights 01/22/2019 01/22/2019 01/02/2019  Weight (lbs) 202 lb 12.8 oz 200 lb 205 lb 6.4 oz  Weight (kg) 91.989 kg 90.719 kg 93.169 kg     Body mass index is 32.73 kg/m.   General:  Obese female, in no acute distress HEENT: Normal Lymph: No adenopathy Neck: No JVD Endocrine:  No thryomegaly Vascular: FA pulses 2+ bilaterally without bruits  Cardiac:  Normal S1, S2; RRR; no murmur  Lungs: Clear to auscultation bilaterally, no wheezing, rhonchi or rales  Abd: Soft, nontender, no hepatomegaly  Ext: Mild bilateral LE edema Musculoskeletal:  No deformities, BUE and BLE strength normal and equal Skin: Warm and dry  Neuro:  CNs 2-12 intact, no focal abnormalities noted Psych:  Normal affect   EKG:  EKG at presentation is NSR with right atrial enlargement, left axis deviation, and intraventricular conduction delay. Today's EKG is similar to prior with the exception of new TWI in lead III, QTc prolongation (likley prolonged by intraventricular conduction delay), and an intraventricular conduction delay consistent with a RBBB.   Telemetry:  Telemetry was personally reviewed and demonstrates: Intermittent episodes of sinus tachycardia.   Relevant CV Studies:  NM Stress Test 03/31/2018   Nuclear stress EF: 71%.  There was no ST  segment deviation noted during stress.  Defect 1: There is a medium defect of mild severity present in the basal anterior and mid anterior location.  This is a low risk study.  The left ventricular ejection fraction is hyperdynamic (>65%).   Low risk stress nuclear study with soft tissue attenuation but no ischemia or infarction; EF 71 with normal wall motion.   Per note in 2010 LHC with normal coronaries.   Laboratory Data:  High Sensitivity Troponin:   Recent Labs  Lab 01/22/19 1450 01/22/19 1737  TROPONINIHS 5 3     Chemistry Recent Labs  Lab 01/22/19 1450 01/23/19 0718  NA 140 140  K 4.3 4.0  CL 104 105  CO2 26 25  GLUCOSE 99 95  BUN 18 16  CREATININE 1.03* 0.86  CALCIUM 8.8* 8.6*  GFRNONAA 53* >60  GFRAA >60 >60  ANIONGAP 10 10    Recent Labs  Lab 01/22/19 1450  PROT 6.7  ALBUMIN 3.6  AST 26  ALT 36  ALKPHOS 73  BILITOT 0.5   Hematology Recent Labs  Lab 01/22/19 1450 01/23/19 0718  WBC 7.8 6.2  RBC 3.63* 3.66*  HGB 11.0* 11.0*  HCT 32.9* 32.8*  MCV 90.6 89.6  MCH 30.3 30.1  MCHC 33.4 33.5  RDW 12.2 12.2  PLT 264 250   BNPNo results for input(s): BNP, PROBNP in the last 168 hours.  DDimer  Recent Labs  Lab 01/22/19 1905  DDIMER 1.45*   Radiology/Studies:  Dg Chest 2 View  Result Date: 01/22/2019 CLINICAL DATA:  Chest pain EXAM: CHEST - 2 VIEW COMPARISON:  02/07/2012 FINDINGS: The lungs are clear without focal pneumonia, edema, pneumothorax or pleural effusion. Linear atelectasis or scarring at the bases, left greater than right, is similar to prior. Cardiopericardial silhouette is at upper limits of normal for size. The visualized bony structures of the thorax are intact. Telemetry leads overlie the chest. IMPRESSION: No active cardiopulmonary disease. Electronically Signed   By: Misty Stanley M.D.   On: 01/22/2019 15:56   Assessment and Plan:   Erin Roach is a 78 y.o. female with a hx of HTN, DM, HLD, PVD (bilateral carotid; RICA  50-27% and LICA 7-41%), and chronic LE edema who is being seen today for the evaluation of left sided sharp chest pain. ACS adequately ruled out and the patient's symptoms are not consistent with typical/atypical chest pain. Suspect non-cardiac source. While she does have significant risk factors for CAD and likely has CAD given her PVD we will await her echocardiogram before performing any further work-up as an inpatient.   Chest pain - Unlikely to be cardiac  - Follow-up echocardiogram, if no new wall motion abnormalities she would be stable from a cardiac standpoint. If there is a new wall motion abnormalities we will need to pursue more invasive testing given her risk factors.  - Her 10 year ASCVD risk score is 45% and therefore would start a high intensity statin  - Continue losartan  - Continue Victoza on discharge   HTN  - Continue losartan  HLD - Start high intensity statin   Carotid Artery Stenosis  - Risk factor modification including HTN control, HLD control, and ASA  Will discuss the case further with Dr. Sallyanne Kuster   For questions or updates, please contact [facility] Please consult [email or web address] for contact info under   Signed, Ina Homes, MD  01/23/2019 9:06 AM   I have seen and examined the patient along with Ina Homes, MD.  I have reviewed the chart, notes and new data.  I agree with his note.  Key new complaints: Chest pain occurred at rest, not worsened by activity, resolved spontaneously. Key examination changes: normal CV exam Key new findings / data: low risk ECG, troponin, echo shows normal LV and RV function and no wall motion abnormalities. Review of old angio shows no visible CAD, review of previous chest CT 2017 shows no coronary calcifications and minimal plaque in the descending aorta. Moderate, stable R carotid disease. 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  PLAN: Low suspicion for cardiac source of symptoms. No plan for additional inpatient cardiac w/u.  Will arrange f/u. If symptoms do recur, recommend coronary CTA. Consider GI etiology (has hiatal hernia on CT, used to take PPI). She reports that she does take simvastatin as an OP. Target LDL<70.  Sanda Klein, MD, [place] [phone] 01/23/2019, 10:58 AM

## 2019-01-23 NOTE — Discharge Summary (Addendum)
Physician Discharge Summary  SUMMIT BORCHARDT ZMO:[id number] DOB: 1940-10-28 DOA: 01/22/2019  PCP: Binnie Rail, MD  Admit date: 01/22/2019 Discharge date: 01/23/2019  Time spent: 45 minutes  Recommendations for Outpatient Follow-up:  1. Follow up with PCP 3-4 weeks for evaluation of symptoms 2. Cardiology will contact to arrange for follow up visit      Discharge Diagnoses:  Principal Problem:   Chest pain Active Problems:   Essential hypertension   Diabetes ([place])   Hyperlipidemia   Obese   RESTLESS LEG SYNDROME   Discharge Condition: stable  Diet recommendation: heart healthy carb modified  Filed Weights   01/22/19 1400 01/22/19 1846  Weight: 90.7 kg 92 kg    History of present illness:  Erin Roach is a 78 y.o. female with medical history significant of hypertension, DM, RLS, asthma, arthritis, hyperlipidemia,bilateral carotid disease, Chronic lower extremity edema, presented to ED 9/13 for chest pain occurred at rest lasted for 10 minutes. Chest pain  precordial, non radiating but associated with right jaw pain lasted for 15 minutes and diaphoresis.  Chest pain resolved spontaneously. Not associated with nausea, vomiting, abdominal pain, diarrhea, dysuria. Pt reported about a month ago, her elavil was increased to BID, and since then she had repeated episodes of lightheadedness and multiple falls. She also reported one episode of syncope, after which her elavil dose was changed. Since then she had no more episodes of lightheadedness and falls.  She denied any tingling or numbness of the extremities, no headaches .  She had chronic pedal edema from venous insufficiency. She denied any hematochezia, hematemesis .   She had cardiac cath in 2010 with normal coronaries. She also had a recent stress test in 2017  Hospital Course:   Chest pain:  Atypical in nature. Heart score 5. Occurred at rest, associated with right jaw pain,  resolved spontaneously.not associated  with nausea or sob. ACS ruled out with negative troponin. EKG does not show any ischemic changes. CXR does not show any acute cardiopulm disease. Echo shows normal LV function and no wall motion abnormalities. D-dimer elevated at 1.45.  Evaluated by cardiology who opined not likely cardiac source. Cardiology noted no visible CAD on old angio, and Chest CT 2017 showed no coronary calcification and minimal plaque in the descending aorta. EKG with RBBB and LAFB.  Recommend PPI and follow up with PCP. Cardiology will contact for follow up appointment.   GERD: hx hiatal hernia as well. Patient reports taking prilosec in past. Provided with protonix. Recommend follow up with PCP 3-4 weeks  Carotid artery disease: preliminary report is duplex within limits of norma. continue simvastatin  Hypertensive urgency: only fair control. Home meds include lasix, losartan. Follow up with PCP regarding optimal BP control.   RLS; stable.   Mild normocytic anemia: Hemoglobin around 11. No s/sx active bleeding. OP follow up.   Hyperlipidemia: lipid panel with triglyceride 279 and LDL 91. OP follow up  Asthma: stable at baseline.   Diabetes Mellitus:  Type 2.  CBG (last 3)  Recent Labs (last 2 labs)      Recent Labs    01/22/19 1418  GLUCAP 90     Hold oral meds.   Procedures: echo Consultations:  Dr Sallyanne Kuster cardiology  Discharge Exam: Vitals:   01/22/19 1931 01/23/19 0400  BP: 98/74 139/71  Pulse: 99   Resp: 19   Temp: 98.4 F (36.9 C) 98.2 F (36.8 C)  SpO2: 99% 99%    General: awake alert obese  no acute distress Cardiovascular: rrr no mgr no LE edema Respiratory: normal effort BS clear bilaterally no wheeze  Discharge Instructions   Discharge Instructions    Call MD for:  difficulty breathing, headache or visual disturbances   Complete by: As directed    Call MD for:  persistant dizziness or light-headedness   Complete by: As directed    Call MD for:  severe  uncontrolled pain   Complete by: As directed    Diet - low sodium heart healthy   Complete by: As directed    Discharge instructions   Complete by: As directed    Take medications as prescribed Follow up with PCP 3-4 weeks for evaluation of symptoms Cardiology will contact to arrange for follow up appointment   Increase activity slowly   Complete by: As directed      Allergies as of 01/23/2019   No Known Allergies     Medication List    TAKE these medications   Accu-Chek Aviva test strip Generic drug: glucose blood Testing twice daily   albuterol 108 (90 Base) MCG/ACT inhaler Commonly known as: ProAir HFA Inhale 2 puffs into the lungs every 6 (six) hours as needed for wheezing or shortness of breath.   amitriptyline 50 MG tablet Commonly known as: ELAVIL Take 2 tablets (100 mg total) by mouth at bedtime. What changed: how much to take   BD Pen Needle Nano 2nd Gen 32G X 4 MM Misc Generic drug: Insulin Pen Needle 1 Package by Does not apply route 2 (two) times daily.   budesonide-formoterol 160-4.5 MCG/ACT inhaler Commonly known as: SYMBICORT Inhale 2 puffs into the lungs daily.   ergocalciferol 1.25 MG (50000 UT) capsule Commonly known as: VITAMIN D2 Take 50,000 Units by mouth once a week. Take one tablet by mouth on Mondays   fluticasone 50 MCG/ACT nasal spray Commonly known as: FLONASE Place 1 spray into both nostrils daily. What changed:   when to take this  reasons to take this   furosemide 20 MG tablet Commonly known as: LASIX TAKE ONE TABLET BY MOUTH DAILY What changed: how much to take   Horizant 600 MG Tbcr Generic drug: Gabapentin Enacarbil Take 1 tablet by mouth at bedtime.   losartan 100 MG tablet Commonly known as: COZAAR Take 1 tablet (100 mg total) by mouth daily. -- Office visit needed for further refills   metFORMIN 500 MG tablet Commonly known as: GLUCOPHAGE TAKE 1 TABLET BY MOUTH TWICE A DAY WITH A MEAL. What changed:   how much  to take  how to take this  when to take this  additional instructions   Mirapex 0.25 MG tablet Generic drug: pramipexole Take 0.25 mg by mouth daily. 1630  And  Bedtime   omeprazole 10 MG capsule Commonly known as: PriLOSEC Take 1 capsule (10 mg total) by mouth daily.   ONE TOUCH ULTRA 2 w/Device Kit USE TO TEST BLOOD SUGARS UP TO 4 TIMES DAILY   pregabalin 75 MG capsule Commonly known as: Lyrica TAKE ONE CAPSULE BY MOUTH EVERY AFTERNOON AND TAKE TWO CAPSULES BY MOUTH EVERY NIGHT AT BEDTIME What changed:   how much to take  how to take this  when to take this  additional instructions   simvastatin 40 MG tablet Commonly known as: ZOCOR TAKE ONE TABLET BY MOUTH AT BEDTIME *OFFICE VISIT NEEDED FOR FURTHER REFILLS* What changed: See the new instructions.   Victoza 18 MG/3ML Sopn Generic drug: liraglutide DIAL AND INJECT SUBCUTANEOUSLY 1.8MG DAILY EVERY  MORNING What changed:   how much to take  how to take this  when to take this  additional instructions      No Known Allergies    The results of significant diagnostics from this hospitalization (including imaging, microbiology, ancillary and laboratory) are listed below for reference.    Significant Diagnostic Studies: Dg Chest 2 View  Result Date: 01/22/2019 CLINICAL DATA:  Chest pain EXAM: CHEST - 2 VIEW COMPARISON:  02/07/2012 FINDINGS: The lungs are clear without focal pneumonia, edema, pneumothorax or pleural effusion. Linear atelectasis or scarring at the bases, left greater than right, is similar to prior. Cardiopericardial silhouette is at upper limits of normal for size. The visualized bony structures of the thorax are intact. Telemetry leads overlie the chest. IMPRESSION: No active cardiopulmonary disease. Electronically Signed   By: Misty Stanley M.D.   On: 01/22/2019 15:56    Microbiology: Recent Results (from the past 240 hour(s))  SARS CORONAVIRUS 2 (TAT 6-24 HRS) Nasopharyngeal  Nasopharyngeal Swab     Status: None   Collection Time: 01/22/19  5:35 PM   Specimen: Nasopharyngeal Swab  Result Value Ref Range Status   SARS Coronavirus 2 NEGATIVE NEGATIVE Final    Comment: (NOTE) SARS-CoV-2 target nucleic acids are NOT DETECTED. The SARS-CoV-2 RNA is generally detectable in upper and lower respiratory specimens during the acute phase of infection. Negative results do not preclude SARS-CoV-2 infection, do not rule out co-infections with other pathogens, and should not be used as the sole basis for treatment or other patient management decisions. Negative results must be combined with clinical observations, patient history, and epidemiological information. The expected result is Negative. Fact Sheet for Patients: SugarRoll.be Fact Sheet for Healthcare Providers: https://www.woods-mathews.com/ This test is not yet approved or cleared by the [place] FDA and  has been authorized for detection and/or diagnosis of SARS-CoV-2 by FDA under an Emergency Use Authorization (EUA). This EUA will remain  in effect (meaning this test can be used) for the duration of the COVID-19 declaration under Section 56 4(b)(1) of the Act, 21 U.S.C. section 360bbb-3(b)(1), unless the authorization is terminated or revoked sooner. Performed at [facility], [place] [address], [place], [place] [address]      Labs: Basic Metabolic Panel: Recent Labs  Lab 01/22/19 1450 01/23/19 0718  NA 140 140  K 4.3 4.0  CL 104 105  CO2 26 25  GLUCOSE 99 95  BUN 18 16  CREATININE 1.03* 0.86  CALCIUM 8.8* 8.6*   Liver Function Tests: Recent Labs  Lab 01/22/19 1450  AST 26  ALT 36  ALKPHOS 73  BILITOT 0.5  PROT 6.7  ALBUMIN 3.6   No results for input(s): LIPASE, AMYLASE in the last 168 hours. No results for input(s): AMMONIA in the last 168 hours. CBC: Recent Labs  Lab 01/22/19 1450 01/23/19 0718  WBC 7.8 6.2  HGB 11.0* 11.0*   HCT 32.9* 32.8*  MCV 90.6 89.6  PLT 264 250   Cardiac Enzymes: No results for input(s): CKTOTAL, CKMB, CKMBINDEX, TROPONINI in the last 168 hours. BNP: BNP (last 3 results) No results for input(s): BNP in the last 8760 hours.  ProBNP (last 3 results) No results for input(s): PROBNP in the last 8760 hours.  CBG: Recent Labs  Lab 01/22/19 1418 01/22/19 2117 01/23/19 0747 01/23/19 1143  GLUCAP 90 129* 95 142*       Signed:  Radene Gunning NP.  [facility] 01/23/2019, 12:00 PM   Patient was seen, examined,treatment  plan was discussed with the Advance Practice Provider.  I have personally reviewed the clinical findings, labs, EKG, imaging studies and management of this patient in detail. I have also reviewed the orders written for this patient which were under my direction. I agree with the documentation, as recorded by the Advance Practice Provider.   Erin Roach is a 78 y.o. female here with chest pain-- Low suspicion for cardiac source of symptoms. No plan for additional inpatient cardiac w/u. Will arrange f/u. If symptoms do recur, recommend coronary CTA. Consider GI etiology (has hiatal hernia on CT, used to take PPI).  Geradine Girt, DO   How to contact the [facility] Attending or Consulting provider [place] or covering provider during after hours [place], for this patient?  1. Check the care team in [facility] and look for a) attending/consulting [facility] provider listed and b) the [facility] team listed 2. Log into [email or web address] and use [place]'s universal password to access. If you do not have the password, please contact the hospital operator. 3. Locate the [facility] provider you are looking for under [facility] and page to a number that you can be directly reached. 4. If you still have difficulty reaching the provider, please page the [facility] (Director on Call) for the Hospitalists listed on amion for assistance.

## 2019-01-23 NOTE — Progress Notes (Signed)
  Echocardiogram 2D Echocardiogram has been performed.  Oron Westrup L Androw 01/23/2019, 10:42 AM

## 2019-01-23 NOTE — Progress Notes (Signed)
VAST returned to pt's room to start IV. Pt's nurse stated stress test has been canceled and pt no longer needs IV.

## 2019-01-23 NOTE — Progress Notes (Addendum)
VAST consulted for PIV placement. VAS RN called unit nurse and inquired of reason for IV as no fluids or regular meds are currently ordered. Pt is going for stress test and needs IV placement.  0945 Arrived at pt bedside to start IV; pt undergoing cardiac echo; expected to last 20 more mins.

## 2019-01-24 ENCOUNTER — Encounter: Payer: Medicare Other | Admitting: Physical Therapy

## 2019-01-24 ENCOUNTER — Telehealth: Payer: Self-pay | Admitting: *Deleted

## 2019-01-24 NOTE — Telephone Encounter (Signed)
Pt was on TCM report admitted for observation 01/22/19 for chest pain. Chest pain  precordial, non radiating but associated with right jaw pain lasted for 15 minutes and diaphoresis. Chest pain resolved spontaneously. Not associated with nausea, vomiting, abdominal pain, diarrhea, dysuria. Pt D/C 01/23/19, and will follow-up w/cardiology.Marland KitchenJohny Chess

## 2019-01-26 ENCOUNTER — Other Ambulatory Visit (INDEPENDENT_AMBULATORY_CARE_PROVIDER_SITE_OTHER): Payer: Self-pay | Admitting: Family Medicine

## 2019-01-26 ENCOUNTER — Encounter: Payer: Medicare Other | Admitting: Physical Therapy

## 2019-01-26 DIAGNOSIS — E559 Vitamin D deficiency, unspecified: Secondary | ICD-10-CM

## 2019-01-31 ENCOUNTER — Encounter: Payer: Self-pay | Admitting: Physical Therapy

## 2019-01-31 ENCOUNTER — Other Ambulatory Visit: Payer: Self-pay

## 2019-01-31 ENCOUNTER — Ambulatory Visit: Payer: Medicare Other | Admitting: Physical Therapy

## 2019-01-31 DIAGNOSIS — R296 Repeated falls: Secondary | ICD-10-CM

## 2019-01-31 DIAGNOSIS — R279 Unspecified lack of coordination: Secondary | ICD-10-CM

## 2019-01-31 DIAGNOSIS — M6281 Muscle weakness (generalized): Secondary | ICD-10-CM

## 2019-01-31 DIAGNOSIS — R262 Difficulty in walking, not elsewhere classified: Secondary | ICD-10-CM

## 2019-01-31 NOTE — Therapy (Signed)
[facility] [address], [place], [place], [address] Phone: [phone]   Fax:  [phone]  Physical Therapy Treatment  Patient Details  Name: Erin Roach MRN: [id number] Date of Birth: Nov 30, 1940 Referring Provider (PT): Dr. Billey Gosling   Encounter Date: 01/31/2019  PT End of Session - 01/31/19 1349    Visit Number  5    Date for PT Re-Evaluation  03/07/19    Authorization Type  UHC Medicare    PT Start Time  [id number]    PT Stop Time  1427    PT Time Calculation (min)  38 min    Activity Tolerance  Patient tolerated treatment well       Past Medical History:  Diagnosis Date  . Allergic rhinitis   . Apnea   . Arthritis   . Asthma    very mild  . Back pain   . Bilateral lower extremity edema   . Carotid artery disease ([place])    a. Carotid duplex 03/2016 - duplex was stable, 123456 RICA, 123456 LICA  . Complication of anesthesia    post-op delirium  . Endometrial polyp   . Essential hypertension   . GERD (gastroesophageal reflux disease)   . Heart murmur    per pt  . History of adenomatous polyp of colon    04-24-2016  tubular adenoma  . History of squamous cell carcinoma excision    pilonidal area second excision 02-02-2002  . HTN (hypertension)   . Hyperlipidemia   . Leg pain   . OAB (overactive bladder)   . RA (rheumatoid arthritis) ([place])   . RBBB (right bundle branch block with left anterior fascicular block)   . RLS (restless legs syndrome)   . Shortness of breath on exertion   . SUI (stress urinary incontinence, female)   . Venous insufficiency    legs  . Vitamin D deficiency     Past Surgical History:  Procedure Laterality Date  . APPENDECTOMY  07-02-2005   [name]   open  . CARDIAC CATHETERIZATION  01-23-2009  dr Darnell Level brodie   normal coronary arteries and lvf  . CARDIOVASCULAR STRESS TEST  03/31/2016   Low risk nuclear study w/ medium defect of mild severity in basal anterior  and mid anterior location w/ no evidence ischemia or infarction/  normal LV function and wall motion,  nuclear stress ef 71%  . CATARACT EXTRACTION W/ INTRAOCULAR LENS IMPLANT Left 09/2016  . COLONOSCOPY  last one 04-24-2016  . HYSTEROSCOPY W/D&C N/A 12/22/2016   Procedure: DILATATION AND CURETTAGE /HYSTEROSCOPY;  Surgeon: Dian Queen, MD;  Location: [facility];  Service: Gynecology;  Laterality: N/A;  . IR US GUIDANCE  09/08/2016  . POSTERIOR LUMBAR FUSION  02/ 2018    [facility] ([place])  . SKIN CANCER EXCISION    . TONSILLECTOMY  child  . 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 TRANSTHORACIC ECHOCARDIOGRAM  01/21/2009   mild LVH, ef [id number], grade 1 diastolic dysfunction  . ULNAR NERVE TRANSPOSITION Left 12-20-2008  dr sypher   decompression and partial resection medial triceps fascia  . WIDE LOCAL EXCISION PILONIDAL AREA  02-02-2002  [name]   recurrent squamous cell carcinoma in situ    There were no vitals filed for this visit.  Subjective Assessment - 01/31/19 1348    Subjective  I had work up for my heart.  All negative they decided it was reflux.  The sit to stand exercise makes my knees hurt.  Sometimes my balance is OK, other times it's not.    Pertinent History  "Vestie";  left TKR;  lumbar fusion; restless legs, HTN; diabetes;  mild neuropathy;  osteopenia    Currently in Pain?  No/denies    Pain Score  0-No pain                       [facility] Adult PT Treatment/Exercise - 01/31/19 0001      Therapeutic Activites    ADL's  walking, standing, sit to stand       Neuro Re-ed    Neuro Re-ed Details   narrow base of support, dyamic balance, compliant surface, with and without vision      Lumbar Exercises: Standing   Heel Raises  15 reps    Lifting Limitations  dead lift 5# to knee lievel 10x     Other Standing Lumbar Exercises  stepping forward and side step over 3 obstacles 5x each leg       Knee/Hip Exercises: Stretches   Hip Flexor Stretch Limitations  2nd step with UE reach 10x right/left       Knee/Hip Exercises: Aerobic   Nustep  seat 8 L1 6 min      Knee/Hip Exercises: Standing   Heel Raises  Both;10 reps    SLS with Vectors  10x right/left     Other Standing Knee Exercises  carrying 5# weight laps    Other Standing Knee Exercises  tandem stand weight pass 10x each side       Knee/Hip Exercises: Seated   Sit to Sand  10 reps   patellofem align cues               PT Short Term Goals - 01/17/19 1624      PT SHORT TERM GOAL #1   Title  independent with initial HEP for balance and strengthening    Status  Achieved      PT SHORT TERM GOAL #3   Title  The patient will be able to walk her puppy for 15 minutes    Status  On-going        PT Long Term Goals - 01/10/19 2020      PT LONG TERM GOAL #1   Title  independent with advanced HEP    Time  8    Period  Weeks    Status  New    [place] Date  03/07/19      PT LONG TERM GOAL #2   Title  The patient will be able to take her puppy for a 20 min walk    Time  8    Period  Weeks    Status  New      PT LONG TERM GOAL #3   Title  BERG balance score improved to 50/56    Time  8    Period  Weeks    Status  New      PT LONG TERM GOAL #4   Title  Dynamic Gait Index improved to 21 indicating improved balance    Time  8    Period  Weeks    Status  New      PT LONG TERM GOAL #5   Title  Timed up and go improved to 10 sec or less  Time  8    Period  Weeks    Status  New      Additional Long Term Goals   Additional Long Term Goals  Yes      PT LONG TERM GOAL #6   Title  Bilateral LE strength grossly 4+/5 needed for improved standing and walking tolerance    Time  8    Period  Weeks    Status  New      PT LONG TERM GOAL #7   Title  Six minute walk test > 600 feet needed for community  ambulation/shopping    Time  8    Period  Weeks    Status  New            Plan - 01/31/19 1435    Clinical Impression Statement  The patient returns after a health scare in which she had chest pain.  After extensive work up it was determined to be non-cardiac.   She is able to perform a progression of dynamic balance ex with narrow base of support, head and UE movements.  She needs min assist from therapist to help stabilize periodically. Staggered standing position and SLR with vectors are quite challenging for her.  Sit to stand ex modified to avoid partial squats secondary to lack of patellofemoral control causing knee pain.  Verbal cues needed for alignment.  She reports less knee pain with modiifications.  General fatigue reported post session as expected.    Comorbidities  Lumbar fusion;  left TKR with poor result; HTN; diabetes; asthma; osteopenia; restless legs    Rehab Potential  Good    PT Frequency  2x / week    PT Duration  8 weeks    PT Next Visit Plan  Nu-Step, stand with foot on step, gait with head turns, unstable surface balance    PT Home Exercise Plan  Access Code: [email or web address]       Patient will benefit from skilled therapeutic intervention in order to improve the following deficits and impairments:  Difficulty walking, Decreased activity tolerance, Decreased balance, Decreased strength  Visit Diagnosis: Difficulty in walking, not elsewhere classified  Unspecified lack of coordination  Repeated falls  Muscle weakness (generalized)     Problem List Patient Active Problem List   Diagnosis Date Noted  . Carotid arterial disease ([place]) 01/23/2019  . RBBB (right bundle branch block with left anterior fascicular block)   . Chest pain 01/22/2019  . Weakness of both lower extremities 01/02/2019  . Frequent falls 01/02/2019  . Cellulitis of right elbow 12/29/2018  . OSA on CPAP 06/15/2017  . Diabetes ([place]) 03/02/2017  . Herpes zoster without complication  [id number]  . Prediabetes 02/23/2017  . Shortness of breath on exertion 02/23/2017  . B12 nutritional deficiency 02/23/2017  . Vitamin D deficiency 12/08/2016  . Tubular adenoma of colon 04/29/2016  . Essential hypertension 09/12/2015  . Allergic rhinitis 07/17/2015  . Insomnia 07/17/2015  . Obese 04/10/2015  . S/P left TKA 04/09/2015  . Osteopenia 12/04/2012  . 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 RESTLESS LEG SYNDROME 02/11/2009  . LEG EDEMA, BILATERAL 02/11/2009  . Hyperlipidemia 10/30/2008  . Urinary incontinence 10/30/2008  . Skin cancer 10/30/2008  . Asthma 11/21/2007  . Venous (peripheral) insufficiency 11/22/2006   Ruben Im, PT 01/31/19 2:44 PM Phone: [phone] Fax: [phone] Alvera Singh 01/31/2019, 2:44 PM  [facility] [address] [place], [place], [address] Phone: [phone]   Fax:  [phone]  Name: Erin Roach MRN: [id number] Date of Birth: 1940/09/14

## 2019-02-01 ENCOUNTER — Encounter: Payer: Self-pay | Admitting: Internal Medicine

## 2019-02-01 ENCOUNTER — Telehealth (INDEPENDENT_AMBULATORY_CARE_PROVIDER_SITE_OTHER): Payer: Medicare Other | Admitting: Family Medicine

## 2019-02-01 ENCOUNTER — Encounter (INDEPENDENT_AMBULATORY_CARE_PROVIDER_SITE_OTHER): Payer: Self-pay | Admitting: Family Medicine

## 2019-02-01 ENCOUNTER — Other Ambulatory Visit: Payer: Self-pay

## 2019-02-01 DIAGNOSIS — R809 Proteinuria, unspecified: Secondary | ICD-10-CM

## 2019-02-01 DIAGNOSIS — E119 Type 2 diabetes mellitus without complications: Secondary | ICD-10-CM | POA: Diagnosis not present

## 2019-02-01 DIAGNOSIS — E1129 Type 2 diabetes mellitus with other diabetic kidney complication: Secondary | ICD-10-CM | POA: Diagnosis not present

## 2019-02-01 DIAGNOSIS — Z6832 Body mass index (BMI) 32.0-32.9, adult: Secondary | ICD-10-CM

## 2019-02-01 DIAGNOSIS — E669 Obesity, unspecified: Secondary | ICD-10-CM

## 2019-02-01 DIAGNOSIS — K219 Gastro-esophageal reflux disease without esophagitis: Secondary | ICD-10-CM | POA: Diagnosis not present

## 2019-02-01 MED ORDER — VICTOZA 18 MG/3ML ~~LOC~~ SOPN: PEN_INJECTOR | SUBCUTANEOUS | 0 refills | Status: DC

## 2019-02-01 MED ORDER — METFORMIN HCL 500 MG PO TABS: ORAL_TABLET | ORAL | 0 refills | Status: DC

## 2019-02-02 ENCOUNTER — Ambulatory Visit: Payer: Medicare Other | Admitting: Physical Therapy

## 2019-02-02 ENCOUNTER — Other Ambulatory Visit: Payer: Self-pay

## 2019-02-02 ENCOUNTER — Other Ambulatory Visit: Payer: Self-pay | Admitting: Internal Medicine

## 2019-02-02 ENCOUNTER — Encounter: Payer: Self-pay | Admitting: Physical Therapy

## 2019-02-02 DIAGNOSIS — M6281 Muscle weakness (generalized): Secondary | ICD-10-CM

## 2019-02-02 DIAGNOSIS — R296 Repeated falls: Secondary | ICD-10-CM

## 2019-02-02 DIAGNOSIS — R262 Difficulty in walking, not elsewhere classified: Secondary | ICD-10-CM | POA: Diagnosis not present

## 2019-02-02 DIAGNOSIS — R279 Unspecified lack of coordination: Secondary | ICD-10-CM

## 2019-02-02 MED ORDER — HYDROCODONE-ACETAMINOPHEN 5-325 MG PO TABS: 1.0000 | ORAL_TABLET | Freq: Every day | ORAL | 0 refills | Status: DC

## 2019-02-02 NOTE — Therapy (Signed)
[facility] [address], [place], [place], [address] Phone: [phone]   Fax:  [phone]  Physical Therapy Treatment  Patient Details  Name: Erin Roach MRN: [id number] Date of Birth: [birth year] Referring Provider (PT): Dr. Billey Gosling   Encounter Date: 02/02/2019  PT End of Session - 02/02/19 1643    Visit Number  6    Date for PT Re-Evaluation  03/07/19    Authorization Type  [employer] Medicare    PT Start Time  [id number]   pt arrives on a day not scheduled   PT Stop Time  1430    PT Time Calculation (min)  36 min    Activity Tolerance  Patient tolerated treatment well       Past Medical History:  Diagnosis Date  . Allergic rhinitis   . Apnea   . Arthritis   . Asthma    very mild  . Back pain   . Bilateral lower extremity edema   . Carotid artery disease ([place])    a. Carotid duplex 03/2016 - duplex was stable, [id number] RICA, [id number] LICA  . Complication of anesthesia    post-op delirium  . Endometrial polyp   . Essential hypertension   . GERD (gastroesophageal reflux disease)   . Heart murmur    per pt  . History of adenomatous polyp of colon    04-24-2016  tubular adenoma  . History of squamous cell carcinoma excision    pilonidal area second excision 02-02-2002  . HTN (hypertension)   . Hyperlipidemia   . Leg pain   . OAB (overactive bladder)   . RA (rheumatoid arthritis) ([place])   . RBBB (right bundle branch block with left anterior fascicular block)   . RLS (restless legs syndrome)   . Shortness of breath on exertion   . SUI (stress urinary incontinence, female)   . Venous insufficiency    legs  . Vitamin D deficiency     Past Surgical History:  Procedure Laterality Date  . APPENDECTOMY  07-02-2005   [name]   open  . CARDIAC CATHETERIZATION  01-23-2009  dr Darnell Level brodie   normal coronary arteries and lvf  . CARDIOVASCULAR STRESS TEST  03/31/2016   Low risk nuclear study w/ medium defect  of mild severity in basal anterior and mid anterior location w/ no evidence ischemia or infarction/  normal LV function and wall motion,  nuclear stress ef 71%  . CATARACT EXTRACTION W/ INTRAOCULAR LENS IMPLANT Left 09/2016  . COLONOSCOPY  last one 04-24-2016  . HYSTEROSCOPY W/D&C N/A 12/22/2016   Procedure: DILATATION AND CURETTAGE /HYSTEROSCOPY;  Surgeon: Dian Queen, MD;  Location: [facility];  Service: Gynecology;  Laterality: N/A;  . IR US GUIDANCE  09/08/2016  . POSTERIOR LUMBAR FUSION  02/ 2018    [facility] ([place])  . SKIN CANCER EXCISION    . TONSILLECTOMY  child  . 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 TRANSTHORACIC ECHOCARDIOGRAM  01/21/2009   mild LVH, ef [id number], grade 1 diastolic dysfunction  . ULNAR NERVE TRANSPOSITION Left 12-20-2008  dr sypher   decompression and partial resection medial triceps fascia  . WIDE LOCAL EXCISION PILONIDAL AREA  02-02-2002  [name]   recurrent squamous cell carcinoma in situ    There were no vitals filed for this visit.  Subjective Assessment -  02/02/19 1358    Subjective  Patient arrives on non-appt day but worked into the schedule.  I'm tired today.  The sit to stand ex with a pillow in the seat is going better.  My knees and lower back bother me a little bit today.    Pertinent History  "[place]";  left TKR;  lumbar fusion; restless legs, HTN; diabetes;  mild neuropathy;  osteopenia    Currently in Pain?  Yes    Pain Score  2     Pain Location  Knee    Pain Orientation  Right;Left                       [facility] Adult PT Treatment/Exercise - 02/02/19 0001      Therapeutic Activites    ADL's  walking, standing, sit to stand       Neuro Re-ed    Neuro Re-ed Details   narrow base of support, dyamic balance, compliant surface, ball toss       Lumbar  Exercises: Stretches   Other Lumbar Stretch Exercise  doorway psoas with UE movement 3x5 right/left     Other Lumbar Stretch Exercise  slant board gastroc stretch 5x each       Lumbar Exercises: Standing   Row  Strengthening;Both;15 reps;Theraband    Row Limitations  tandem stand     Other Standing Lumbar Exercises  rocker board 2 min     Other Standing Lumbar Exercises  washcloth hip abduction and circle slides 10x right/left       Knee/Hip Exercises: Aerobic   Nustep  seat 8 L2 6 min      Knee/Hip Exercises: Standing   SLS  retro step with green band handles 10x right/left     Rebounder  3 ways 1 min each; marching 1 minute     Other Standing Knee Exercises  ladder walk high step 2 laps     Other Standing Knee Exercises  ball toss with varying foot positions and standing on black foam                PT Short Term Goals - 01/17/19 1624      PT SHORT TERM GOAL #1   Title  independent with initial HEP for balance and strengthening    Status  Achieved      PT SHORT TERM GOAL #3   Title  The patient will be able to walk her puppy for 15 minutes    Status  On-going        PT Long Term Goals - 01/10/19 2020      PT LONG TERM GOAL #1   Title  independent with advanced HEP    Time  8    Period  Weeks    Status  New    [place] Date  03/07/19      PT LONG TERM GOAL #2   Title  The patient will be able to take her puppy for a 20 min walk    Time  8    Period  Weeks    Status  New      PT LONG TERM GOAL #3   Title  BERG balance score improved to 50/56    Time  8    Period  Weeks    Status  New      PT LONG TERM GOAL #4   Title  Dynamic Gait Index improved to 21 indicating improved balance  Time  8    Period  Weeks    Status  New      PT LONG TERM GOAL #5   Title  Timed up and go improved to 10 sec or less    Time  8    Period  Weeks    Status  New      Additional Long Term Goals   Additional Long Term Goals  Yes      PT LONG TERM GOAL #6   Title   Bilateral LE strength grossly 4+/5 needed for improved standing and walking tolerance    Time  8    Period  Weeks    Status  New      PT LONG TERM GOAL #7   Title  Six minute walk test > 600 feet needed for community ambulation/shopping    Time  8    Period  Weeks    Status  New            Plan - 02/02/19 1644    Clinical Impression Statement  The patient complains of knee pain today so treatment modified to avoid excessive knee movements focusing more on balance ex today.  She has difficulty with single leg standing ex's requiring therapist min assist but did well today with UE and head movments with narrow base of support and compliant surfaces.  She reports muscular fatigue in hip abductors during and post treatment session.  Therapist closely monitoring response with all treatment interventions and providing close supervision for safety secondary to risk of falls.    Comorbidities  Lumbar fusion;  left TKR with poor result; HTN; diabetes; asthma; osteopenia; restless legs    Rehab Potential  Good    PT Frequency  2x / week    PT Duration  8 weeks    PT Next Visit Plan  Nu-Step, stand with foot on step, gait with head turns, unstable surface balance    PT Home Exercise Plan  Access Code: [email or web address]       Patient will benefit from skilled therapeutic intervention in order to improve the following deficits and impairments:  Difficulty walking, Decreased activity tolerance, Decreased balance, Decreased strength  Visit Diagnosis: Difficulty in walking, not elsewhere classified  Unspecified lack of coordination  Repeated falls  Muscle weakness (generalized)     Problem List Patient Active Problem List   Diagnosis Date Noted  . Carotid arterial disease ([place]) 01/23/2019  . RBBB (right bundle branch block with left anterior fascicular block)   . Chest pain 01/22/2019  . Weakness of both lower extremities 01/02/2019  . Frequent falls 01/02/2019  . Cellulitis of right elbow  12/29/2018  . OSA on CPAP 06/15/2017  . Diabetes ([place]) 03/02/2017  . Herpes zoster without complication [id number]  . Prediabetes 02/23/2017  . Shortness of breath on exertion 02/23/2017  . B12 nutritional deficiency 02/23/2017  . Vitamin D deficiency 12/08/2016  . Tubular adenoma of colon 04/29/2016  . Essential hypertension 09/12/2015  . Allergic rhinitis 07/17/2015  . Insomnia 07/17/2015  . Obese 04/10/2015  . S/P left TKA 04/09/2015  . Osteopenia 12/04/2012  . 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 RESTLESS LEG SYNDROME 02/11/2009  . LEG EDEMA, BILATERAL 02/11/2009  . Hyperlipidemia 10/30/2008  . Urinary incontinence 10/30/2008  . Skin cancer 10/30/2008  . Asthma 11/21/2007  . Venous (peripheral) insufficiency 11/22/2006   Ruben Im, PT 02/02/19 4:49 PM Phone: [phone] Fax: [phone] Alvera Singh 02/02/2019, 4:48 PM  [facility] [address], [place], [place], [address] Phone: [phone]   Fax:  [phone]  Name: JINELLE DOLSEN MRN: [id number] Date of Birth: [birth year]

## 2019-02-02 NOTE — Telephone Encounter (Signed)
For some reason medication is not on her current list.

## 2019-02-02 NOTE — Telephone Encounter (Signed)
Last OV 12/29/18 Next OV NA Last RF 01/07/19

## 2019-02-06 NOTE — Progress Notes (Signed)
Office: [phone]  /  Fax: [phone] TeleHealth Visit:  Erin Roach has verbally consented to this TeleHealth visit today. The patient is located at home, the provider is located at the [facility] office. The participants in this visit include the listed provider and patient. The visit was conducted today via telephone call (Doxy failed - changed to telephone call).  HPI:   Chief Complaint: OBESITY Erin Roach is here to discuss her progress with her obesity treatment plan. She is keeping a food journal with 1500 calories and 85  grams of protein and is following her eating plan approximately 50% of the time. She states she is doing physical therapy 45 minutes 2 times per week and walking/home exercises/yard work 30-45 minutes 5 days a week. Erin Roach feels she has done well maintaining her weight. She had a health scare and was admitted for observation for chest pain. She was told it was GERD and has resolved on omeprazole. We were unable to weigh the patient today for this TeleHealth visit. She feels as if she has maintained her weight since her last visit. She has lost 25 lbs since starting treatment with [name].  Diabetes II Erin Roach has a diagnosis of diabetes type II. [name] states blood sugars are averaging 120 to 145 - both fasting and postprandial. Last A1c 5.4 on 12/13/2018. She has been working on intensive lifestyle modifications including diet, exercise, and weight loss to help control her blood glucose levels. No nausea, vomiting, or hypoglycemia.  Gastroesophageal Reflux Disease (GERD) Erin Roach has a new diagnosis of gastroesophageal reflux disease, which is controlled on omeprazole.  ASSESSMENT AND PLAN:  Type 2 diabetes mellitus without complication, without long-term current use of insulin (HCC) - Plan: liraglutide (VICTOZA) 18 MG/3ML SOPN  Type 2 diabetes mellitus with microalbuminuria, without long-term current use of insulin ([place]) - Plan: metFORMIN (GLUCOPHAGE) 500  MG tablet  Gastroesophageal reflux disease, esophagitis presence not specified  Class 1 obesity with serious comorbidity and body mass index (BMI) of 32.0 to 32.9 in adult, unspecified obesity type  PLAN:  Diabetes II Erin Roach has been given extensive diabetes education by myself today including ideal fasting and post-prandial blood glucose readings, individual ideal HgA1c goals  and hypoglycemia prevention. We discussed the importance of good blood sugar control to decrease the likelihood of diabetic complications such as nephropathy, neuropathy, limb loss, blindness, coronary artery disease, and death. We discussed the importance of intensive lifestyle modification including diet, exercise and weight loss as the first line treatment for diabetes. [name] was given refills on metformin and Victoza and agrees to follow-up with our clinic in 2-3 weeks.  Gastroesophageal Reflux Disease (GERD) Erin Roach was educated on the importance of diet and weight loss in addition to medications to help control her symptoms.  Obesity Erin Roach is currently in the action stage of change. As such, her goal is to continue with weight loss efforts. She has agreed to keep a food journal with 1500 calories and 85 grams of protein daily. Erin Roach has been instructed to work up to a goal of 150 minutes of combined cardio and strengthening exercise per week for weight loss and overall health benefits. We discussed the following Behavioral Modification Strategies today: travel eating strategies and celebration eating strategies.  Erin Roach has agreed to follow-up with our clinic in 2-3 weeks. She was informed of the importance of frequent follow-up visits to maximize her success with intensive lifestyle modifications for her multiple health conditions.  ALLERGIES: No Known Allergies  MEDICATIONS: Current  Outpatient Medications on File Prior to Visit  Medication Sig Dispense Refill  . albuterol (PROAIR HFA) 108 (90 Base) MCG/ACT  inhaler Inhale 2 puffs into the lungs every 6 (six) hours as needed for wheezing or shortness of breath. 1 Inhaler 5  . amitriptyline (ELAVIL) 50 MG tablet Take 2 tablets (100 mg total) by mouth at bedtime. (Patient taking differently: Take 50 mg by mouth at bedtime. )    . Blood Glucose Monitoring Suppl (ONE TOUCH ULTRA 2) w/Device KIT USE TO TEST BLOOD SUGARS UP TO 4 TIMES DAILY 1 each 0  . budesonide-formoterol (SYMBICORT) 160-4.5 MCG/ACT inhaler Inhale 2 puffs into the lungs daily. 1 Inhaler 5  . ergocalciferol (VITAMIN D2) 1.25 MG (50000 UT) capsule Take 50,000 Units by mouth once a week. Take one tablet by mouth on Mondays    . fluticasone (FLONASE) 50 MCG/ACT nasal spray Place 1 spray into both nostrils daily. (Patient taking differently: Place 1 spray into both nostrils daily as needed for allergies. ) 16 g 0  . furosemide (LASIX) 20 MG tablet TAKE ONE TABLET BY MOUTH DAILY (Patient taking differently: Take 40 mg by mouth daily. ) 90 tablet 1  . glucose blood (ACCU-CHEK AVIVA) test strip Testing twice daily 100 each 0  . HORIZANT 600 MG TBCR Take 1 tablet by mouth at bedtime.     . Insulin Pen Needle (BD PEN NEEDLE NANO 2ND GEN) 32G X 4 MM MISC 1 Package by Does not apply route 2 (two) times daily. 100 each 0  . omeprazole (PRILOSEC) 10 MG capsule Take 1 capsule (10 mg total) by mouth daily. 30 capsule 1  . pramipexole (MIRAPEX) 0.25 MG tablet Take 0.25 mg by mouth daily. 1630  And  Bedtime     . pregabalin (LYRICA) 75 MG capsule TAKE ONE CAPSULE BY MOUTH EVERY AFTERNOON AND TAKE TWO CAPSULES BY MOUTH EVERY NIGHT AT BEDTIME (Patient taking differently: Take 75 mg by mouth every evening. ) 270 capsule 0  . simvastatin (ZOCOR) 40 MG tablet TAKE ONE TABLET BY MOUTH AT BEDTIME *OFFICE VISIT NEEDED FOR FURTHER REFILLS* (Patient taking differently: Take 40 mg by mouth at bedtime. ) 90 tablet 0   No current facility-administered medications on file prior to visit.     PAST MEDICAL HISTORY: Past  Medical History:  Diagnosis Date  . Allergic rhinitis   . Apnea   . Arthritis   . Asthma    very mild  . Back pain   . Bilateral lower extremity edema   . Carotid artery disease ([place])    a. Carotid duplex 03/2016 - duplex was stable, 21-19% RICA, 4-17% LICA  . Complication of anesthesia    post-op delirium  . Endometrial polyp   . Essential hypertension   . GERD (gastroesophageal reflux disease)   . Heart murmur    per pt  . History of adenomatous polyp of colon    04-24-2016  tubular adenoma  . History of squamous cell carcinoma excision    pilonidal area second excision 02-02-2002  . HTN (hypertension)   . Hyperlipidemia   . Leg pain   . OAB (overactive bladder)   . RA (rheumatoid arthritis) ([place])   . RBBB (right bundle branch block with left anterior fascicular block)   . RLS (restless legs syndrome)   . Shortness of breath on exertion   . SUI (stress urinary incontinence, female)   . Venous insufficiency    legs  . Vitamin D deficiency  PAST SURGICAL HISTORY: Past Surgical History:  Procedure Laterality Date  . APPENDECTOMY  07-02-2005   [name]   open  . CARDIAC CATHETERIZATION  01-23-2009  dr Darnell Level brodie   normal coronary arteries and lvf  . CARDIOVASCULAR STRESS TEST  03/31/2016   Low risk nuclear study w/ medium defect of mild severity in basal anterior and mid anterior location w/ no evidence ischemia or infarction/  normal LV function and wall motion,  nuclear stress ef 71%  . CATARACT EXTRACTION W/ INTRAOCULAR LENS IMPLANT Left 09/2016  . COLONOSCOPY  last one 04-24-2016  . HYSTEROSCOPY W/D&C N/A 12/22/2016   Procedure: DILATATION AND CURETTAGE /HYSTEROSCOPY;  Surgeon: Dian Queen, MD;  Location: [facility];  Service: Gynecology;  Laterality: N/A;  . IR US GUIDANCE  09/08/2016  . POSTERIOR LUMBAR FUSION  02/ 2018    [facility] ([place])  . SKIN CANCER EXCISION    . TONSILLECTOMY  child  . TOTAL KNEE ARTHROPLASTY  Left 04/09/2015   Procedure: LEFT TOTAL KNEE ARTHROPLASTY;  Surgeon: Paralee Cancel, MD;  Location[facility];  Service: Orthopedics;  Laterality: Left;  . TRANSTHORACIC ECHOCARDIOGRAM  01/21/2009   mild LVH, ef 19-41%, grade 1 diastolic dysfunction  . ULNAR NERVE TRANSPOSITION Left 12-20-2008  dr sypher   decompression and partial resection medial triceps fascia  . WIDE LOCAL EXCISION PILONIDAL AREA  02-02-2002  [name]   recurrent squamous cell carcinoma in situ    SOCIAL HISTORY: Social History   Tobacco Use  . Smoking status: Former Smoker    Packs/day: 0.50    Years: 10.00    Pack years: 5.00    Types: Cigarettes    Quit date: 12/10/1973    Years since quitting: 45.1  . Smokeless tobacco: Never Used  Substance Use Topics  . Alcohol use: Yes    Comment: occasional  . Drug use: No    FAMILY HISTORY: Family History  Problem Relation Age of Onset  . Esophageal cancer Father   . Heart disease Father        Unknown what kind  . Hyperlipidemia Father   . Hypertension Father   . Cancer Father   . Heart failure Father   . Hyperlipidemia Mother   . Hypertension Mother   . CAD Sister        39 years younger than patient - had stent. Was told that her anorexia/bulimia may have played a role  . Stroke Maternal Grandmother 66  . Diabetes Maternal Grandmother   . Heart attack Maternal Grandfather 93  . COPD Maternal Grandfather   . Heart attack Paternal Grandmother        38s  . Diabetes Other        MGGM   ROS: Review of Systems  Gastrointestinal: Negative for nausea and vomiting.       Positive for gastroesophageal reflux disease (GERD).  Endo/Heme/Allergies:       Negative for hypoglycemia.   PHYSICAL EXAM: Pt in no acute distress  RECENT LABS AND TESTS: BMET    Component Value Date/Time   NA 140 01/23/2019 0718   NA 137 12/13/2018 0935   K 4.0 01/23/2019 0718   CL 105 01/23/2019 0718   CO2 25 01/23/2019 0718   GLUCOSE 95 01/23/2019 0718   BUN 16  01/23/2019 0718   BUN 21 12/13/2018 0935   CREATININE 0.86 01/23/2019 0718   CALCIUM 8.6 (L) 01/23/2019 0718   GFRNONAA >60 01/23/2019 0718   GFRAA >60 01/23/2019  2957   Lab Results  Component Value Date   HGBA1C 5.4 12/13/2018   HGBA1C 5.7 (H) 04/20/2018   HGBA1C 5.3 01/06/2018   HGBA1C 5.4 09/28/2017   HGBA1C 5.5 06/09/2017   Lab Results  Component Value Date   INSULIN 13.3 12/13/2018   INSULIN 27.9 (H) 04/20/2018   INSULIN 24.6 01/06/2018   INSULIN 17.0 09/28/2017   INSULIN 28.3 (H) 06/09/2017   CBC    Component Value Date/Time   WBC 6.2 01/23/2019 0718   RBC 3.66 (L) 01/23/2019 0718   HGB 11.0 (L) 01/23/2019 0718   HGB 11.8 09/28/2017 0906   HCT 32.8 (L) 01/23/2019 0718   HCT 35.4 09/28/2017 0906   PLT 250 01/23/2019 0718   MCV 89.6 01/23/2019 0718   MCV 87 09/28/2017 0906   MCH 30.1 01/23/2019 0718   MCHC 33.5 01/23/2019 0718   RDW 12.2 01/23/2019 0718   RDW 14.3 09/28/2017 0906   LYMPHSABS 1.5 09/28/2017 0906   MONOABS 0.5 06/04/2016 0808   EOSABS 0.2 09/28/2017 0906   BASOSABS 0.0 09/28/2017 0906   Iron/TIBC/Ferritin/ %Sat    Component Value Date/Time   IRON 109 03/02/2017 1035   TIBC 333 03/19/2015 1448   FERRITIN 218.0 03/02/2017 1035   IRONPCTSAT 15 03/19/2015 1448   Lipid Panel     Component Value Date/Time   CHOL 192 01/23/2019 0718   CHOL 190 12/13/2018 0935   TRIG 279 (H) 01/23/2019 0718   TRIG 101 05/19/2006 0832   HDL 45 01/23/2019 0718   HDL 54 12/13/2018 0935   CHOLHDL 4.3 01/23/2019 0718   VLDL 56 (H) 01/23/2019 0718   LDLCALC 91 01/23/2019 0718   LDLCALC 104 (H) 12/13/2018 0935   LDLDIRECT 141.3 09/06/2012 0907   Hepatic Function Panel     Component Value Date/Time   PROT 6.7 01/22/2019 1450   PROT 6.9 12/13/2018 0935   ALBUMIN 3.6 01/22/2019 1450   ALBUMIN 4.4 12/13/2018 0935   AST 26 01/22/2019 1450   ALT 36 01/22/2019 1450   ALKPHOS 73 01/22/2019 1450   BILITOT 0.5 01/22/2019 1450   BILITOT 0.4 12/13/2018 0935    BILIDIR 0.1 01/22/2019 1450   IBILI 0.4 01/22/2019 1450      Component Value Date/Time   TSH 5.130 (H) 12/13/2018 0935   TSH 4.050 09/28/2017 0906   TSH 4.810 (H) 02/23/2017 1007   Results for Erin Roach, Erin Roach (MRN [id number]) as of 02/06/2019 07:34  Ref. Range 12/13/2018 09:35  Vitamin D, 25-Hydroxy Latest Ref Range: 30.0 - 100.0 ng/mL 52.4   I, Michaelene Song, am acting as Location manager for Dennard Nip, MD  I have reviewed the above documentation for accuracy and completeness, and I agree with the above. -Dennard Nip, MD

## 2019-02-07 ENCOUNTER — Encounter: Payer: Medicare Other | Admitting: Physical Therapy

## 2019-02-08 ENCOUNTER — Other Ambulatory Visit (INDEPENDENT_AMBULATORY_CARE_PROVIDER_SITE_OTHER): Payer: Self-pay | Admitting: Family Medicine

## 2019-02-08 DIAGNOSIS — E1129 Type 2 diabetes mellitus with other diabetic kidney complication: Secondary | ICD-10-CM

## 2019-02-09 ENCOUNTER — Encounter: Payer: Self-pay | Admitting: Internal Medicine

## 2019-02-09 ENCOUNTER — Encounter: Payer: Medicare Other | Admitting: Physical Therapy

## 2019-02-09 NOTE — Progress Notes (Signed)
Outside notes received. Information abstracted. Notes sent to scan.  

## 2019-02-16 ENCOUNTER — Other Ambulatory Visit: Payer: Self-pay

## 2019-02-16 ENCOUNTER — Ambulatory Visit: Payer: Medicare Other | Attending: Internal Medicine | Admitting: Physical Therapy

## 2019-02-16 DIAGNOSIS — G8929 Other chronic pain: Secondary | ICD-10-CM | POA: Insufficient documentation

## 2019-02-16 DIAGNOSIS — M79621 Pain in right upper arm: Secondary | ICD-10-CM | POA: Insufficient documentation

## 2019-02-16 DIAGNOSIS — R279 Unspecified lack of coordination: Secondary | ICD-10-CM | POA: Diagnosis present

## 2019-02-16 DIAGNOSIS — M25552 Pain in left hip: Secondary | ICD-10-CM | POA: Diagnosis present

## 2019-02-16 DIAGNOSIS — R262 Difficulty in walking, not elsewhere classified: Secondary | ICD-10-CM | POA: Diagnosis present

## 2019-02-16 DIAGNOSIS — R296 Repeated falls: Secondary | ICD-10-CM | POA: Insufficient documentation

## 2019-02-16 DIAGNOSIS — M545 Low back pain: Secondary | ICD-10-CM | POA: Insufficient documentation

## 2019-02-16 DIAGNOSIS — M6281 Muscle weakness (generalized): Secondary | ICD-10-CM | POA: Diagnosis present

## 2019-02-16 DIAGNOSIS — M25551 Pain in right hip: Secondary | ICD-10-CM | POA: Insufficient documentation

## 2019-02-16 NOTE — Therapy (Signed)
[facility] [address], [place], [place], [address] Phone: [phone]   Fax:  [phone]  Physical Therapy Treatment  Patient Details  Name: Erin Roach MRN: [id number] Date of Birth: Oct 12, 1940 Referring Provider (PT): Dr. Billey Gosling   Encounter Date: 02/16/2019  PT End of Session - 02/16/19 1410    Visit Number  7    Date for PT Re-Evaluation  03/07/19    Authorization Type  [employer] Medicare    PT Start Time  [id number]    PT Stop Time  [id number]    PT Time Calculation (min)  40 min    Activity Tolerance  Patient tolerated treatment well       Past Medical History:  Diagnosis Date  . Allergic rhinitis   . Apnea   . Arthritis   . Asthma    very mild  . Back pain   . Bilateral lower extremity edema   . Carotid artery disease ([place])    a. Carotid duplex 03/2016 - duplex was stable, 123456 RICA, 123456 LICA  . Complication of anesthesia    post-op delirium  . Endometrial polyp   . Essential hypertension   . GERD (gastroesophageal reflux disease)   . Heart murmur    per pt  . History of adenomatous polyp of colon    04-24-2016  tubular adenoma  . History of squamous cell carcinoma excision    pilonidal area second excision 02-02-2002  . HTN (hypertension)   . Hyperlipidemia   . Leg pain   . OAB (overactive bladder)   . RA (rheumatoid arthritis) ([place])   . RBBB (right bundle branch block with left anterior fascicular block)   . RLS (restless legs syndrome)   . Shortness of breath on exertion   . SUI (stress urinary incontinence, female)   . Venous insufficiency    legs  . Vitamin D deficiency     Past Surgical History:  Procedure Laterality Date  . APPENDECTOMY  07-02-2005   [name]   open  . CARDIAC CATHETERIZATION  01-23-2009  dr Darnell Level brodie   normal coronary arteries and lvf  . CARDIOVASCULAR STRESS TEST  03/31/2016   Low risk nuclear study w/ medium defect of mild severity in basal anterior  and mid anterior location w/ no evidence ischemia or infarction/  normal LV function and wall motion,  nuclear stress ef 71%  . CATARACT EXTRACTION W/ INTRAOCULAR LENS IMPLANT Left 09/2016  . COLONOSCOPY  last one 04-24-2016  . HYSTEROSCOPY W/D&C N/A 12/22/2016   Procedure: DILATATION AND CURETTAGE /HYSTEROSCOPY;  Surgeon: Dian Queen, MD;  Location: [facility];  Service: Gynecology;  Laterality: N/A;  . IR US GUIDANCE  09/08/2016  . POSTERIOR LUMBAR FUSION  02/ 2018    [facility] ([place])  . SKIN CANCER EXCISION    . TONSILLECTOMY  child  . 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 TRANSTHORACIC ECHOCARDIOGRAM  01/21/2009   mild LVH, ef Q000111Q, grade 1 diastolic dysfunction  . ULNAR NERVE TRANSPOSITION Left 12-20-2008  dr sypher   decompression and partial resection medial triceps fascia  . WIDE LOCAL EXCISION PILONIDAL AREA  02-02-2002  [name]   recurrent squamous cell carcinoma in situ    There were no vitals filed for this visit.  Subjective Assessment - 02/16/19 1404    Subjective  [place]  My knee hurts but it always does.  My balance is not right.    Pertinent History  "Erin Roach";  left TKR;  lumbar fusion; restless legs, HTN; diabetes;  mild neuropathy;  osteopenia    Pain Score  2     Pain Location  Knee         [facility] PT Assessment - 02/16/19 0001      Berg Balance Test   Sit to Stand  Able to stand without using hands and stabilize independently    Standing Unsupported  Able to stand safely 2 minutes    Sitting with Back Unsupported but Feet Supported on Floor or Stool  Able to sit safely and securely 2 minutes    Stand to Sit  Sits safely with minimal use of hands    Transfers  Able to transfer safely, minor use of hands    Standing Unsupported with Eyes Closed  Able to stand  10 seconds safely    Standing Unsupported with Feet Together  Able to place feet together independently and stand 1 minute safely    From Standing, Reach Forward with Outstretched Arm  Can reach forward >12 cm safely (5")    From Standing Position, Pick up Object from Floor  Able to pick up shoe safely and easily    From Standing Position, Turn to Look Behind Over each Shoulder  Looks behind from both sides and weight shifts well    Turn 360 Degrees  Able to turn 360 degrees safely in 4 seconds or less    Standing Unsupported, Alternately Place Feet on Step/Stool  Able to stand independently and safely and complete 8 steps in 20 seconds    Standing Unsupported, One Foot in Front  Able to take small step independently and hold 30 seconds    Standing on One Leg  Tries to lift leg/unable to hold 3 seconds but remains standing independently    Total Score  50      Dynamic Gait Index   Level Surface  Normal    Change in Gait Speed  Normal    Gait with Horizontal Head Turns  Normal    Gait with Vertical Head Turns  Normal    Gait and Pivot Turn  Normal    Step Over Obstacle  Normal    Step Around Obstacles  Normal    Steps  Mild Impairment    Total Score  23      Timed Up and Go Test   Normal TUG (seconds)  8.91                   [facility] Adult PT Treatment/Exercise - 02/16/19 0001      Therapeutic Activites    ADL's  walking, standing, sit to stand , reciprocal stairs      Neuro Re-ed    Neuro Re-ed Details   narrow base of support, dyamic balance, single leg balance       Knee/Hip Exercises: Aerobic   Nustep  seat 8 L2 8 min      Knee/Hip Exercises: Standing   Heel Raises  Both;10 reps    Forward Lunges  Right;Left;5 reps    Forward Lunges Limitations  c/o right knee pain     Side Lunges  Right;Left;5 reps    Side Lunges Limitations  c/o right knee pain to both sides     Hip Extension  Stengthening;Right;Left;10 reps    Extension  Limitations  green band 1 pole for UE  assist    Walking with Sports Cord  25# forward and back resisted walking 5x each way with CGA     Other Standing Knee Exercises  hip hinge with cane 5x, hip hinge 5x; hip hinge 5# 5x                PT Short Term Goals - 02/16/19 1543      PT SHORT TERM GOAL #1   Title  independent with initial HEP for balance and strengthening    Status  Achieved      PT SHORT TERM GOAL #2   Title  The patient will have improved BERG balance test to 48/56    Status  Achieved      PT SHORT TERM GOAL #3   Title  The patient will be able to walk her puppy for 15 minutes    Time  4    Period  Weeks    Status  On-going        PT Long Term Goals - 02/16/19 1543      PT LONG TERM GOAL #1   Title  independent with advanced HEP    Time  8    Period  Weeks    Status  On-going      PT LONG TERM GOAL #2   Title  The patient will be able to take her puppy for a 20 min walk    Time  8    Period  Weeks    Status  On-going      PT LONG TERM GOAL #3   Title  BERG balance score improved to 50/56    Status  Achieved      PT LONG TERM GOAL #4   Title  Dynamic Gait Index improved to 21 indicating improved balance    Status  Achieved      PT LONG TERM GOAL #5   Title  Timed up and go improved to 10 sec or less    Status  Achieved      PT LONG TERM GOAL #6   Title  Bilateral LE strength grossly 4+/5 needed for improved standing and walking tolerance    Time  8    Period  Weeks    Status  On-going      PT LONG TERM GOAL #7   Title  Six minute walk test > 600 feet needed for community ambulation/shopping    Time  8    Period  Weeks    Status  On-going            Plan - 02/16/19 1539    Clinical Impression Statement  The patient demonstrates significant improvements in Timed up and Go from 11.1 sec to 8.91.  Dynamic Gait Index has improved as well from 19/24 to 23/24.  BERG balance test has improved as well from 45/56 to 50/56.  Narrow base of support and single leg standing ex's  are the most challenging.  Exercises are modified secondary to right knee pain.  Close supervision needed for safety with balance challenges.  On track to meet rehab goals.    Comorbidities  Lumbar fusion;  left TKR with poor result; HTN; diabetes; asthma; osteopenia; restless legs    Rehab Potential  Good    PT Frequency  2x / week    PT Duration  8 weeks    PT Treatment/Interventions  ADLs/Self Care Home Management;Therapeutic exercise;Therapeutic activities;Balance training;Neuromuscular re-education;Patient/family  education    PT Next Visit Plan  recheck 6 min walk test;  check walking time for STG;  Nu-Step, stand with foot on step, unstable surface balance;  narrow base of support and single limb standing    PT Home Exercise Plan  Access Code: [email or web address]       Patient will benefit from skilled therapeutic intervention in order to improve the following deficits and impairments:  Difficulty walking, Decreased activity tolerance, Decreased balance, Decreased strength  Visit Diagnosis: Difficulty in walking, not elsewhere classified  Unspecified lack of coordination  Repeated falls  Muscle weakness (generalized)     Problem List Patient Active Problem List   Diagnosis Date Noted  . Carotid arterial disease ([place]) 01/23/2019  . RBBB (right bundle branch block with left anterior fascicular block)   . Chest pain 01/22/2019  . Weakness of both lower extremities 01/02/2019  . Frequent falls 01/02/2019  . Cellulitis of right elbow 12/29/2018  . OSA on CPAP 06/15/2017  . Diabetes ([place]) 03/02/2017  . Herpes zoster without complication 123456  . Prediabetes 02/23/2017  . Shortness of breath on exertion 02/23/2017  . B12 nutritional deficiency 02/23/2017  . Vitamin D deficiency 12/08/2016  . Tubular adenoma of colon 04/29/2016  . Essential hypertension 09/12/2015  . Allergic rhinitis 07/17/2015  . Insomnia 07/17/2015  . Obese 04/10/2015  . S/P left TKA 04/09/2015  . Osteopenia  12/04/2012  . 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 RESTLESS LEG SYNDROME 02/11/2009  . LEG EDEMA, BILATERAL 02/11/2009  . Hyperlipidemia 10/30/2008  . Urinary incontinence 10/30/2008  . Skin cancer 10/30/2008  . Asthma 11/21/2007  . Venous (peripheral) insufficiency 11/22/2006   Ruben Im, PT 02/16/19 3:46 PM Phone: [phone] Fax: [phone] Erin Roach 02/16/2019, 3:46 PM  [facility] [address] [place], [place], [address] Phone: [phone]   Fax:  [phone]  Name: Erin Roach MRN: [id number] Date of Birth: March 01, 1941

## 2019-02-21 ENCOUNTER — Ambulatory Visit: Payer: Medicare Other | Admitting: Physical Therapy

## 2019-02-23 ENCOUNTER — Ambulatory Visit: Payer: Medicare Other | Admitting: Physical Therapy

## 2019-02-23 ENCOUNTER — Encounter: Payer: Self-pay | Admitting: Physical Therapy

## 2019-02-23 ENCOUNTER — Other Ambulatory Visit: Payer: Self-pay

## 2019-02-23 DIAGNOSIS — M6281 Muscle weakness (generalized): Secondary | ICD-10-CM

## 2019-02-23 DIAGNOSIS — R262 Difficulty in walking, not elsewhere classified: Secondary | ICD-10-CM

## 2019-02-23 DIAGNOSIS — R279 Unspecified lack of coordination: Secondary | ICD-10-CM

## 2019-02-23 DIAGNOSIS — R296 Repeated falls: Secondary | ICD-10-CM

## 2019-02-23 NOTE — Therapy (Signed)
Erin Roach [address], [place], [place], [address] Phone: [phone]   Fax:  [phone]  Erin Roach  Erin Roach  Name: Erin Roach MRN: [id number] Date of Birth: Aug 19, 1940 Referring Provider (PT): Dr. Billey Gosling   Encounter Date: 02/23/2019  PT End of Session - 02/23/19 1717    Visit Number  8    Date for PT Re-Evaluation  03/07/19    Authorization Type  Erin Roach    PT Start Time  [id number]    PT Stop Time  1615    PT Time Calculation (min)  42 min    Activity Tolerance  Erin tolerated Roach well       Past Medical History:  Diagnosis Date  . Allergic rhinitis   . Apnea   . Arthritis   . Asthma    very mild  . Back pain   . Bilateral lower extremity edema   . Carotid artery disease ([place])    a. Carotid duplex 03/2016 - duplex was stable, 123456 RICA, 123456 LICA  . Complication of anesthesia    post-op delirium  . Endometrial polyp   . Essential hypertension   . GERD (gastroesophageal reflux disease)   . Heart murmur    per pt  . History of adenomatous polyp of colon    04-24-2016  tubular adenoma  . History of squamous cell carcinoma excision    pilonidal area second excision 02-02-2002  . HTN (hypertension)   . Hyperlipidemia   . Leg pain   . OAB (overactive bladder)   . RA (rheumatoid arthritis) ([place])   . RBBB (right bundle branch block with left anterior fascicular block)   . RLS (restless legs syndrome)   . Shortness of breath on exertion   . SUI (stress urinary incontinence, female)   . Venous insufficiency    legs  . Vitamin D deficiency     Past Surgical History:  Procedure Laterality Date  . APPENDECTOMY  07-02-2005   [name]   open  . CARDIAC CATHETERIZATION  01-23-2009  dr Darnell Level brodie   normal coronary arteries and lvf  . CARDIOVASCULAR STRESS TEST  03/31/2016   Low risk nuclear study w/ medium defect of mild severity in basal anterior  and mid anterior location w/ no evidence ischemia or infarction/  normal LV function and wall motion,  nuclear stress ef 71%  . CATARACT EXTRACTION W/ INTRAOCULAR LENS IMPLANT Left 09/2016  . COLONOSCOPY  last one 04-24-2016  . HYSTEROSCOPY W/D&C N/A 12/22/2016   Procedure: DILATATION AND CURETTAGE /HYSTEROSCOPY;  Surgeon: Dian Queen, MD;  Location: [facility];  Service: Gynecology;  Laterality: N/A;  . IR US GUIDANCE  09/08/2016  . POSTERIOR LUMBAR FUSION  02/ 2018    [facility] ([place], [place])  . SKIN CANCER EXCISION    . TONSILLECTOMY  child  . 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 TRANSTHORACIC ECHOCARDIOGRAM  01/21/2009   mild LVH, ef Q000111Q, grade 1 diastolic dysfunction  . ULNAR NERVE TRANSPOSITION Left 12-20-2008  dr sypher   decompression and partial resection medial triceps fascia  . WIDE LOCAL EXCISION PILONIDAL AREA  02-02-2002  [name]   recurrent squamous cell carcinoma in situ    There were no vitals filed for this visit.  Subjective Assessment - 02/23/19 1536    Subjective  My week is "not great."  I haven't slept in days b/c of restless legs.  I am going to [place] next week to see a specialist about restless legs.  Walking all night long.  Daytime is not too bad.  I got a weighted blanket.    Pertinent History  "[name]";  left TKR;  lumbar fusion; restless legs, HTN; diabetes;  mild neuropathy;  osteopenia    How long can you walk comfortably?  20 minutes    Currently in Pain?  No/denies    Pain Score  0-No pain                       [facility] Adult PT Roach/Exercise - 02/23/19 0001      Therapeutic Activites    ADL's  walking, standing, sit to stand , reciprocal stairs      Neuro Re-ed    Neuro Re-ed Roach   narrow base of support, dyamic balance, single leg balance       Lumbar  Exercises: Aerobic   UBE (Upper Arm Bike)  standing 6 min       Lumbar Exercises: Seated   Other Seated Lumbar Exercises  seated foam roll push down to activate transverse abdominus       Knee/Hip Exercises: Stretches   Active Hamstring Stretch  Right;Left;5 reps    Active Hamstring Stretch Limitations  2nd step     Hip Flexor Stretch Limitations  2nd step with UE reach 10x right/left       Knee/Hip Exercises: Aerobic   Nustep  seat 7 L2 4 min      Knee/Hip Exercises: Standing   Hip Abduction  Stengthening;Right;Left;10 reps    Abduction Limitations  red band     Hip Extension  Stengthening;Right;Left;10 reps    Extension Limitations  red band     Walking with Sports Cord  30# 5x backward only     Other Standing Knee Exercises  weight swings 15x 7#     Other Standing Knee Exercises  abdominal brace with 20# rows in regular and staggered standing                PT Short Term Goals - 02/16/19 1543      PT SHORT TERM GOAL #1   Title  independent with initial HEP for balance and strengthening    Status  Achieved      PT SHORT TERM GOAL #2   Title  The Erin will have improved BERG balance test to 48/56    Status  Achieved      PT SHORT TERM GOAL #3   Title  The Erin will be able to walk her puppy for 15 minutes    Time  4    Period  Weeks    Status  On-going        PT Long Term Goals - 02/16/19 1543      PT LONG TERM GOAL #1   Title  independent with advanced HEP    Time  8    Period  Weeks    Status  On-going      PT LONG TERM GOAL #2   Title  The Erin will be able to take her puppy for a 20 min walk    Time  8    Period  Weeks    Status  On-going      PT LONG TERM GOAL #3   Title  BERG balance score improved to  50/56    Status  Achieved      PT LONG TERM GOAL #4   Title  Dynamic Gait Index improved to 21 indicating improved balance    Status  Achieved      PT LONG TERM GOAL #5   Title  Timed up and go improved to 10 sec or less     Status  Achieved      PT LONG TERM GOAL #6   Title  Bilateral LE strength grossly 4+/5 needed for improved standing and walking tolerance    Time  8    Period  Weeks    Status  On-going      PT LONG TERM GOAL #7   Title  Six minute walk test > 600 feet needed for community ambulation/shopping    Time  8    Period  Weeks    Status  On-going            Plan - 02/23/19 1610    Clinical Impression Statement  Erin reports she is generally not doing well this week from minimal sleep secondary to restless legs.  She states she paces all night.  She plans to travel to [place] next week in order to see a specialist.  She is able to participate in lower level strengthening ex's (modified secondary to Erin's report of not feeling well today.)  Therapist closely monitoring with all interventions.    Comorbidities  Lumbar fusion;  left TKR with poor result; HTN; diabetes; asthma; osteopenia; restless legs    Rehab Potential  Good    PT Frequency  2x / week    PT Duration  8 weeks    PT Roach/Interventions  ADLs/Self Care Home Management;Therapeutic exercise;Therapeutic activities;Balance training;Neuromuscular re-education;Erin/family education    PT Next Visit Plan  follow up in 10 days;  do recert;  recheck 6 min walk test;  check walking time for STG;  Nu-Step, stand with foot on step, unstable surface balance;  narrow base of support and single limb standing    PT Home Exercise Plan  Access Code: [email or web address]       Erin will benefit from skilled therapeutic intervention in order to improve the following deficits and impairments:  Difficulty walking, Decreased activity tolerance, Decreased balance, Decreased strength  Visit Diagnosis: Difficulty in walking, not elsewhere classified  Unspecified lack of coordination  Repeated falls  Muscle weakness (generalized)     Problem List Erin Active Problem List   Diagnosis Date Noted  . Carotid arterial disease ([place])  01/23/2019  . RBBB (right bundle branch block with left anterior fascicular block)   . Chest pain 01/22/2019  . Weakness of both lower extremities 01/02/2019  . Frequent falls 01/02/2019  . Cellulitis of right elbow 12/29/2018  . OSA on CPAP 06/15/2017  . Diabetes ([place]) 03/02/2017  . Herpes zoster without complication 123456  . Prediabetes 02/23/2017  . Shortness of breath on exertion 02/23/2017  . B12 nutritional deficiency 02/23/2017  . Vitamin D deficiency 12/08/2016  . Tubular adenoma of colon 04/29/2016  . Essential hypertension 09/12/2015  . Allergic rhinitis 07/17/2015  . Insomnia 07/17/2015  . Obese 04/10/2015  . S/P left TKA 04/09/2015  . Osteopenia 12/04/2012  . 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 RESTLESS LEG SYNDROME 02/11/2009  . LEG EDEMA, BILATERAL 02/11/2009  . Hyperlipidemia 10/30/2008  . Urinary incontinence 10/30/2008  . Skin cancer 10/30/2008  . Asthma 11/21/2007  . Venous (peripheral) insufficiency 11/22/2006   Ruben Im, PT 02/23/19 5:21 PM Phone:  [phone] Fax: [phone] Alvera Singh 02/23/2019, 5:21 PM  [facility] [address] [place], [place], [address] Phone: [phone]   Fax:  [phone]  Name: Erin Roach MRN: [id number] Date of Birth: [birth year]

## 2019-02-27 ENCOUNTER — Other Ambulatory Visit (INDEPENDENT_AMBULATORY_CARE_PROVIDER_SITE_OTHER): Payer: Self-pay | Admitting: Family Medicine

## 2019-02-28 ENCOUNTER — Encounter: Payer: Self-pay | Admitting: Internal Medicine

## 2019-02-28 ENCOUNTER — Encounter: Payer: Medicare Other | Admitting: Physical Therapy

## 2019-03-01 ENCOUNTER — Other Ambulatory Visit: Payer: Self-pay

## 2019-03-01 MED ORDER — HYDROCODONE-ACETAMINOPHEN 5-325 MG PO TABS: 1.0000 | ORAL_TABLET | Freq: Every day | ORAL | 0 refills | Status: DC

## 2019-03-01 NOTE — Telephone Encounter (Signed)
Last RF 02/05/19 Last OV 06/15/18 Next OV NA

## 2019-03-02 ENCOUNTER — Encounter: Payer: Medicare Other | Admitting: Physical Therapy

## 2019-03-06 ENCOUNTER — Ambulatory Visit (INDEPENDENT_AMBULATORY_CARE_PROVIDER_SITE_OTHER): Payer: Medicare Other | Admitting: Internal Medicine

## 2019-03-06 ENCOUNTER — Other Ambulatory Visit (INDEPENDENT_AMBULATORY_CARE_PROVIDER_SITE_OTHER): Payer: Self-pay | Admitting: Family Medicine

## 2019-03-06 ENCOUNTER — Other Ambulatory Visit: Payer: Self-pay

## 2019-03-06 ENCOUNTER — Encounter: Payer: Self-pay | Admitting: Internal Medicine

## 2019-03-06 VITALS — BP 140/80 | HR 106 | Temp 98.3°F | Ht 66.0 in | Wt 201.0 lb

## 2019-03-06 DIAGNOSIS — G2581 Restless legs syndrome: Secondary | ICD-10-CM

## 2019-03-06 DIAGNOSIS — I10 Essential (primary) hypertension: Secondary | ICD-10-CM | POA: Diagnosis not present

## 2019-03-06 DIAGNOSIS — K219 Gastro-esophageal reflux disease without esophagitis: Secondary | ICD-10-CM

## 2019-03-06 DIAGNOSIS — E1129 Type 2 diabetes mellitus with other diabetic kidney complication: Secondary | ICD-10-CM

## 2019-03-06 DIAGNOSIS — R0789 Other chest pain: Secondary | ICD-10-CM

## 2019-03-06 MED ORDER — OMEPRAZOLE 10 MG PO CPDR: 10.0000 mg | DELAYED_RELEASE_CAPSULE | Freq: Every day | ORAL | 1 refills | Status: DC

## 2019-03-06 MED ORDER — HYDROCODONE-ACETAMINOPHEN 5-325 MG PO TABS: 1.0000 | ORAL_TABLET | Freq: Every day | ORAL | 0 refills | Status: DC

## 2019-03-06 NOTE — Progress Notes (Signed)
Subjective:    Patient ID: Erin Roach, female    DOB: [birth year], 78 y.o.   MRN: [id number]  HPI The patient is here for follow up.  Admitted 9/13-9/14 for chest pain.  She had chest pressure across her chest and then started to have jaw pain. The chest pain lasted 10 min and the jaw pain lasted 15 min.  She had no associated symptoms.  Her chest pain was atypical.  She had negative troponin, EKG w/o changes, CXR normal, Echo was normal, D-dimer was elevated.  Cardiology saw patient who thought pain was not cardiac in nature.  Old angio showed no coronary calcification.  Ct chest 2017 showed no calcifications in coronaries.  She had minimal plaque in aorta.  EKG showed RBBB and LAFB.   PPI recommended - omeprazole 10 mg daily, which she has been taking.  She denies recurrence of symptoms.  She does recall having a couple of episodes of reflux in the 1-2 weeks preceding the chest / jaw pain.       RLS: she is taking her medications as prescribed.  She will be seeing a new specialist in December.  She has good nights and many nights where she does not sleep    Hypertension: She is taking her medication daily. She is compliant with a low sodium diet.  She denies chest pain, palpitations, shortness of breath and regular headaches.      Medications and allergies reviewed with patient and updated if appropriate.  Patient Active Problem List   Diagnosis Date Noted  . Carotid arterial disease ([place]) 01/23/2019  . RBBB (right bundle branch block with left anterior fascicular block)   . Chest pain 01/22/2019  . Weakness of both lower extremities 01/02/2019  . Frequent falls 01/02/2019  . Cellulitis of right elbow 12/29/2018  . OSA on CPAP 06/15/2017  . Diabetes ([place]) 03/02/2017  . Herpes zoster without complication 63/87/5643  . Prediabetes 02/23/2017  . Shortness of breath on exertion 02/23/2017  . B12 nutritional deficiency 02/23/2017  . Vitamin D deficiency 12/08/2016  . Tubular  adenoma of colon 04/29/2016  . Essential hypertension 09/12/2015  . Allergic rhinitis 07/17/2015  . Insomnia 07/17/2015  . Obese 04/10/2015  . S/P left TKA 04/09/2015  . Osteopenia 12/04/2012  . 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 RESTLESS LEG SYNDROME 02/11/2009  . LEG EDEMA, BILATERAL 02/11/2009  . Hyperlipidemia 10/30/2008  . Urinary incontinence 10/30/2008  . Skin cancer 10/30/2008  . Asthma 11/21/2007  . Venous (peripheral) insufficiency 11/22/2006    Current Outpatient Medications on File Prior to Visit  Medication Sig Dispense Refill  . albuterol (PROAIR HFA) 108 (90 Base) MCG/ACT inhaler Inhale 2 puffs into the lungs every 6 (six) hours as needed for wheezing or shortness of breath. 1 Inhaler 5  . amitriptyline (ELAVIL) 50 MG tablet Take 2 tablets (100 mg total) by mouth at bedtime. (Patient taking differently: Take 50 mg by mouth at bedtime. )    . Blood Glucose Monitoring Suppl (ONE TOUCH ULTRA 2) w/Device KIT USE TO TEST BLOOD SUGARS UP TO 4 TIMES DAILY 1 each 0  . budesonide-formoterol (SYMBICORT) 160-4.5 MCG/ACT inhaler Inhale 2 puffs into the lungs daily. 1 Inhaler 5  . ergocalciferol (VITAMIN D2) 1.25 MG (50000 UT) capsule Take 50,000 Units by mouth once a week. Take one tablet by mouth on Mondays    . fluticasone (FLONASE) 50 MCG/ACT nasal spray Place 1 spray into both nostrils daily. (Patient taking differently: Place 1 spray into  both nostrils daily as needed for allergies. ) 16 g 0  . furosemide (LASIX) 20 MG tablet TAKE ONE TABLET BY MOUTH DAILY (Patient taking differently: Take 40 mg by mouth daily. ) 90 tablet 1  . glucose blood (ACCU-CHEK AVIVA) test strip Testing twice daily 100 each 0  . HORIZANT 600 MG TBCR Take 1 tablet by mouth at bedtime.     [name] HYDROcodone-acetaminophen (NORCO/VICODIN) 5-325 MG tablet Take 1 tablet by mouth at bedtime. 30 tablet 0  . Insulin Pen Needle (BD PEN NEEDLE NANO 2ND GEN) 32G X 4 MM MISC 1 Package by Does not apply route 2 (two) times  daily. 100 each 0  . liraglutide (VICTOZA) 18 MG/3ML SOPN DIAL AND INJECT SUBCUTANEOUSLY 1.8MG DAILY EVERY MORNING 3 pen 0  . losartan (COZAAR) 100 MG tablet TAKE ONE TABLET BY MOUTH DAILY *OFFICE VISIT NEEDED FOR FURTHER REFILLS* 30 tablet 0  . metFORMIN (GLUCOPHAGE) 500 MG tablet TAKE 1 TABLET BY MOUTH TWICE A DAY WITH A MEAL. 60 tablet 0  . omeprazole (PRILOSEC) 10 MG capsule Take 1 capsule (10 mg total) by mouth daily. 30 capsule 1  . omeprazole (PRILOSEC) 20 MG capsule Take 20 mg by mouth daily.    . pramipexole (MIRAPEX) 0.25 MG tablet Take 0.25 mg by mouth daily. 1630  And  Bedtime     . pregabalin (LYRICA) 75 MG capsule TAKE ONE CAPSULE BY MOUTH EVERY AFTERNOON AND TAKE TWO CAPSULES BY MOUTH EVERY NIGHT AT BEDTIME (Patient taking differently: Take 75 mg by mouth every evening. ) 270 capsule 0  . simvastatin (ZOCOR) 40 MG tablet TAKE ONE TABLET BY MOUTH AT BEDTIME *OFFICE VISIT NEEDED FOR FURTHER REFILLS* (Patient taking differently: Take 40 mg by mouth at bedtime. ) 90 tablet 0   No current facility-administered medications on file prior to visit.     Past Medical History:  Diagnosis Date  . Allergic rhinitis   . Apnea   . Arthritis   . Asthma    very mild  . Back pain   . Bilateral lower extremity edema   . Carotid artery disease ([place])    a. Carotid duplex 03/2016 - duplex was stable, 34-28% RICA, 7-68% LICA  . Complication of anesthesia    post-op delirium  . Endometrial polyp   . Essential hypertension   . GERD (gastroesophageal reflux disease)   . Heart murmur    per pt  . History of adenomatous polyp of colon    04-24-2016  tubular adenoma  . History of squamous cell carcinoma excision    pilonidal area second excision 02-02-2002  . HTN (hypertension)   . Hyperlipidemia   . Leg pain   . OAB (overactive bladder)   . RA (rheumatoid arthritis) ([place])   . RBBB (right bundle branch block with left anterior fascicular block)   . RLS (restless legs syndrome)   .  Shortness of breath on exertion   . SUI (stress urinary incontinence, female)   . Venous insufficiency    legs  . Vitamin D deficiency     Past Surgical History:  Procedure Laterality Date  . APPENDECTOMY  07-02-2005   [name]   open  . CARDIAC CATHETERIZATION  01-23-2009  dr Darnell Level brodie   normal coronary arteries and lvf  . CARDIOVASCULAR STRESS TEST  03/31/2016   Low risk nuclear study w/ medium defect of mild severity in basal anterior and mid anterior location w/ no evidence ischemia or infarction/  normal LV function and wall  motion,  nuclear stress ef 71%  . CATARACT EXTRACTION W/ INTRAOCULAR LENS IMPLANT Left 09/2016  . COLONOSCOPY  last one 04-24-2016  . HYSTEROSCOPY W/D&C N/A 12/22/2016   Procedure: DILATATION AND CURETTAGE /HYSTEROSCOPY;  Surgeon: Dian Queen, MD;  Location: [facility];  Service: Gynecology;  Laterality: N/A;  . IR US GUIDANCE  09/08/2016  . POSTERIOR LUMBAR FUSION  02/ 2018    [facility] ([place])  . SKIN CANCER EXCISION    . TONSILLECTOMY  child  . 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 TRANSTHORACIC ECHOCARDIOGRAM  01/21/2009   mild LVH, ef 84-53%, grade 1 diastolic dysfunction  . ULNAR NERVE TRANSPOSITION Left 12-20-2008  dr sypher   decompression and partial resection medial triceps fascia  . WIDE LOCAL EXCISION PILONIDAL AREA  02-02-2002  [name]   recurrent squamous cell carcinoma in situ    Social History   Socioeconomic History  . Marital status: Married    Spouse name: Clare Gandy  . Number of children: 2  . Years of education: Not on file  . Highest education level: Not on file  Occupational History  . Occupation: retired Tour manager  . Financial resource strain: Not hard at all  . Food insecurity    Worry: Never true    Inability: Never true  .  Transportation needs    Medical: No    Non-medical: No  Tobacco Use  . Smoking status: Former Smoker    Packs/day: 0.50    Years: 10.00    Pack years: 5.00    Types: Cigarettes    Quit date: 12/10/1973    Years since quitting: 45.2  . Smokeless tobacco: Never Used  Substance and Sexual Activity  . Alcohol use: Yes    Comment: occasional  . Drug use: No  . Sexual activity: Not on file  Lifestyle  . Physical activity    Days per week: 3 days    Minutes per session: 40 min  . Stress: Only a little  Relationships  . Social connections    Talks on phone: More than three times a week    Gets together: More than three times a week    Attends religious service: Not on file    Active member of club or organization: Not on file    Attends meetings of clubs or organizations: Not on file    Relationship status: Married  Other Topics Concern  . Not on file  Social History Narrative   2 children ages 28,35    Family History  Problem Relation Age of Onset  . Esophageal cancer Father   . Heart disease Father        Unknown what kind  . Hyperlipidemia Father   . Hypertension Father   . Cancer Father   . Heart failure Father   . Hyperlipidemia Mother   . Hypertension Mother   . CAD Sister        55 years younger than patient - had stent. Was told that her anorexia/bulimia may have played a role  . Stroke Maternal Grandmother 66  . Diabetes Maternal Grandmother   . Heart attack Maternal Grandfather 93  . COPD Maternal Grandfather   . Heart attack Paternal Grandmother        52s  . Diabetes Other        MGGM  Review of Systems  Constitutional: Negative for fever.  Respiratory: Negative for cough, shortness of breath and wheezing.   Cardiovascular: Positive for leg swelling (chronic). Negative for chest pain and palpitations.  Gastrointestinal: Negative for abdominal pain.       No gerd, burping  Neurological: Negative for light-headedness and headaches.        Objective:   Vitals:   03/06/19 1546  BP: 140/80  Pulse: (!) 106  Temp: 98.3 F (36.8 C)  SpO2: 98%   BP Readings from Last 3 Encounters:  03/06/19 140/80  01/23/19 139/71  01/02/19 (!) 146/68   Wt Readings from Last 3 Encounters:  03/06/19 201 lb (91.2 kg)  01/22/19 202 lb 12.8 oz (92 kg)  01/02/19 205 lb 6.4 oz (93.2 kg)   Body mass index is 32.44 kg/m.   Physical Exam    Constitutional: Appears well-developed and well-nourished. No distress.  HENT:  Head: Normocephalic and atraumatic.  Neck: Neck supple. No tracheal deviation present. No thyromegaly present.  No cervical lymphadenopathy Cardiovascular: Normal rate, regular rhythm and normal heart sounds.   No murmur heard. No carotid bruit .  No edema Pulmonary/Chest: Effort normal and breath sounds normal. No respiratory distress. No has no wheezes. No rales.  Skin: Skin is warm and dry. Not diaphoretic.  Psychiatric: Normal mood and affect. Behavior is normal.      Assessment & Plan:    See Problem List for Assessment and Plan of chronic medical problems.

## 2019-03-06 NOTE — Assessment & Plan Note (Signed)
Chest pain likely related to atypical GERD Omeprazole 10 mg daily - prn   Discussed potential side effects - on a low dose - wants to try to take it only prn -- GERD diet, advised to take for several days - a week if needed

## 2019-03-06 NOTE — Assessment & Plan Note (Signed)
Following with specialist and will see a new one in a couple of  months taking hydrocodone nightly, Mirapex lyrica, elavil, horizant Continue current medications

## 2019-03-06 NOTE — Patient Instructions (Signed)
   Medications reviewed and updated.  Changes include :   Continue omeprazole or take as needed.    Your prescription(s) have been submitted to your pharmacy. Please take as directed and contact our office if you believe you are having problem(s) with the medication(s).   Please followup in 6 months

## 2019-03-06 NOTE — Assessment & Plan Note (Signed)
Non cardiac Likely atypical GERD No recurrence since taking omeprazole 10 mg daily - she would like to try to stop it -- will take prn -- if she has any GERD symptoms should take for a week or so Discussed concerns with long term use, but also discussed preventing damage to esophagus

## 2019-03-06 NOTE — Assessment & Plan Note (Signed)
Slightly high today Monitor at home Continue current medications

## 2019-03-07 ENCOUNTER — Encounter: Payer: Self-pay | Admitting: Physical Therapy

## 2019-03-07 ENCOUNTER — Encounter: Payer: Self-pay | Admitting: Internal Medicine

## 2019-03-07 ENCOUNTER — Ambulatory Visit: Payer: Medicare Other | Admitting: Physical Therapy

## 2019-03-07 DIAGNOSIS — M79601 Pain in right arm: Secondary | ICD-10-CM

## 2019-03-07 DIAGNOSIS — R279 Unspecified lack of coordination: Secondary | ICD-10-CM

## 2019-03-07 DIAGNOSIS — M25562 Pain in left knee: Secondary | ICD-10-CM

## 2019-03-07 DIAGNOSIS — M545 Low back pain, unspecified: Secondary | ICD-10-CM

## 2019-03-07 DIAGNOSIS — R262 Difficulty in walking, not elsewhere classified: Secondary | ICD-10-CM | POA: Diagnosis not present

## 2019-03-07 DIAGNOSIS — M25559 Pain in unspecified hip: Secondary | ICD-10-CM

## 2019-03-07 DIAGNOSIS — M6281 Muscle weakness (generalized): Secondary | ICD-10-CM

## 2019-03-07 DIAGNOSIS — R296 Repeated falls: Secondary | ICD-10-CM

## 2019-03-07 NOTE — Therapy (Signed)
[facility] [address], [place], [place], [address] Phone: [phone]   Fax:  [phone]  Physical Therapy Treatment  Patient Details  Name: Erin Roach MRN: [id number] Date of Birth: 08-Jun-1940 Referring Provider (PT): Dr. Billey Gosling   Encounter Date: 03/07/2019  PT End of Session - 03/07/19 1441    Visit Number  9    Date for PT Re-Evaluation  03/07/19    Authorization Type  [employer] Medicare    PT Start Time  6962    PT Stop Time  1440    PT Time Calculation (min)  35 min    Activity Tolerance  Patient tolerated treatment well;Patient limited by pain    Behavior During Therapy  [facility] for tasks assessed/performed       Past Medical History:  Diagnosis Date  . Allergic rhinitis   . Apnea   . Arthritis   . Asthma    very mild  . Back pain   . Bilateral lower extremity edema   . Carotid artery disease ([place])    a. Carotid duplex 03/2016 - duplex was stable, 95-28% RICA, 4-13% LICA  . Complication of anesthesia    post-op delirium  . Endometrial polyp   . Essential hypertension   . GERD (gastroesophageal reflux disease)   . Heart murmur    per pt  . History of adenomatous polyp of colon    04-24-2016  tubular adenoma  . History of squamous cell carcinoma excision    pilonidal area second excision 02-02-2002  . HTN (hypertension)   . Hyperlipidemia   . Leg pain   . OAB (overactive bladder)   . RA (rheumatoid arthritis) ([place])   . RBBB (right bundle branch block with left anterior fascicular block)   . RLS (restless legs syndrome)   . Shortness of breath on exertion   . SUI (stress urinary incontinence, female)   . Venous insufficiency    legs  . Vitamin D deficiency     Past Surgical History:  Procedure Laterality Date  . APPENDECTOMY  07-02-2005   [name]   open  . CARDIAC CATHETERIZATION  01-23-2009  dr Darnell Level brodie   normal coronary arteries and lvf  . CARDIOVASCULAR STRESS TEST   03/31/2016   Low risk nuclear study w/ medium defect of mild severity in basal anterior and mid anterior location w/ no evidence ischemia or infarction/  normal LV function and wall motion,  nuclear stress ef 71%  . CATARACT EXTRACTION W/ INTRAOCULAR LENS IMPLANT Left 09/2016  . COLONOSCOPY  last one 04-24-2016  . HYSTEROSCOPY W/D&C N/A 12/22/2016   Procedure: DILATATION AND CURETTAGE /HYSTEROSCOPY;  Surgeon: Dian Queen, MD;  Location: [facility];  Service: Gynecology;  Laterality: N/A;  . IR US GUIDANCE  09/08/2016  . POSTERIOR LUMBAR FUSION  02/ 2018    [facility] ([place])  . SKIN CANCER EXCISION    . TONSILLECTOMY  child  . 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 TRANSTHORACIC ECHOCARDIOGRAM  01/21/2009   mild LVH, ef 24-40%, grade 1 diastolic dysfunction  . ULNAR NERVE TRANSPOSITION Left 12-20-2008  dr sypher   decompression and partial resection medial triceps fascia  . WIDE LOCAL EXCISION PILONIDAL AREA  02-02-2002  [name]   recurrent squamous cell carcinoma in situ    There were no vitals filed  for this visit.  Subjective Assessment - 03/07/19 1406    Subjective  I took last week off from PT to do the exercises on my own but I was limited some days by Rt hip pain.  The legs have been better at night the last three nights so I've had better sleep lately.  I feel my balance is improving but not where I want to be yet.    Pertinent History  "[place]";  left TKR;  lumbar fusion; restless legs, HTN; diabetes;  mild neuropathy;  osteopenia    Limitations  House hold activities    How long can you walk comfortably?  12 min without support, longer with grocery cart    Diagnostic tests  none    Patient Stated Goals  not to have those spells happen and be more active again    Currently in Pain?  Yes    Pain Score  3      Pain Location  Knee    Pain Orientation  Left    Pain Descriptors / Indicators  Aching    Pain Type  Chronic pain    Pain Onset  More than a month ago    Pain Frequency  Constant    Aggravating Factors   balance challenges: walking, stairs if without railing, curbs         [facility] PT Assessment - 03/07/19 0001      Assessment   Medical Diagnosis  weakness LEs; falls    Referring Provider (PT)  Dr. Billey Gosling    Onset Date/Surgical Date  08/09/18    Next MD Visit  as needed     Prior Therapy  in past for TKR;  fusion       Balance Screen   Has the patient fallen in the past 6 months  --   no falls since starting PT     Observation/Other Assessments   Activities of Balance Confidence Scale (ABC Scale)   46%   Pt feels that Lt knee pain and LBP caused decreased conf     ROM / Strength   AROM / PROM / Strength  Strength      Strength   Overall Strength Comments  4+/5 throughout bil LEs      Ambulation/Gait   Gait Comments  6 min walk test, Pt used UEs abduction of shoulder around 90 deg turns to feel more confident      Berg Balance Test   Sit to Stand  Able to stand without using hands and stabilize independently    Standing Unsupported  Able to stand safely 2 minutes    Sitting with Back Unsupported but Feet Supported on Floor or Stool  Able to sit safely and securely 2 minutes    Stand to Sit  Sits safely with minimal use of hands    Transfers  Able to transfer safely, minor use of hands    Standing Unsupported with Eyes Closed  Able to stand 10 seconds safely    Standing Unsupported with Feet Together  Able to place feet together independently and stand 1 minute safely    From Standing, Reach Forward with Outstretched Arm  Can reach forward >12 cm safely (5")    From Standing Position, Pick up Object from Floor  Able to pick up shoe safely and easily    From Standing Position, Turn to Look Behind Over each Shoulder  Looks behind from both sides and weight shifts well     Turn 360  Degrees  Able to turn 360 degrees safely in 4 seconds or less    Standing Unsupported, Alternately Place Feet on Step/Stool  Able to stand independently and safely and complete 8 steps in 20 seconds    Standing Unsupported, One Foot in Front  Able to take small step independently and hold 30 seconds    Standing on One Leg  Tries to lift leg/unable to hold 3 seconds but remains standing independently    Total Score  50      Dynamic Gait Index   Level Surface  Normal    Change in Gait Speed  Normal    Gait with Horizontal Head Turns  Normal    Gait with Vertical Head Turns  Normal    Gait and Pivot Turn  Normal    Step Over Obstacle  Normal    Step Around Obstacles  Normal    Steps  Mild Impairment    Total Score  23      Timed Up and Go Test   Normal TUG (seconds)  8.91                   [facility] Adult PT Treatment/Exercise - 03/07/19 0001      Exercises   Exercises  Lumbar      Lumbar Exercises: Aerobic   Nustep  L2 x 12 min, PT present to review goals and do ABC scale               PT Short Term Goals - 03/07/19 1441      PT SHORT TERM GOAL #1   Title  independent with initial HEP for balance and strengthening    Status  Achieved      PT SHORT TERM GOAL #2   Title  The patient will have improved BERG balance test to 48/56    Status  Achieved      PT SHORT TERM GOAL #3   Title  The patient will be able to walk her puppy for 15 minutes    Baseline  walks x 12 min without pain, can do 15 but with pain    Status  Achieved        PT Long Term Goals - 03/07/19 1417      PT LONG TERM GOAL #1   Title  independent with advanced HEP    Baseline  some days limited by pain for HEP    Status  Partially Met      PT LONG TERM GOAL #2   Title  The patient will be able to take her puppy for a 20 min walk    Baseline  can walk 20 min but it hurts, 12 min before pain    Status  Achieved      PT LONG TERM GOAL #3   Title  BERG balance score  improved to 50/56    Status  Achieved      PT LONG TERM GOAL #4   Title  Dynamic Gait Index improved to 21 indicating improved balance    Status  Achieved      PT LONG TERM GOAL #5   Title  Timed up and go improved to 10 sec or less    Status  Achieved      PT LONG TERM GOAL #6   Title  Bilateral LE strength grossly 4+/5 needed for improved standing and walking tolerance    Baseline  throughout bil LEs but with pain    Status  Achieved      PT LONG TERM GOAL #7   Title  Six minute walk test > 600 feet needed for community ambulation/shopping    Baseline  920' in 6 min with pain of 8/10 in bil hips beginning at 3' mark    Status  Achieved            Plan - 03/07/19 1443    Clinical Impression Statement  Pt has met all LTGs surrounding objective measures of balance and strength (BERG, DGI, TUG, and LE strength).  Goal of tolerance of gait x 20' is met but with significant pain which is better if holding onto a grocery cart.  While her objective measures are improved, she feels limited by pain and her balance confidence has decreased as a result.  She is limited in HEP compliance some days due to pain.  Her Activities of Balance Confidence Scale has decreased to 46% (from 65%).  PT and Pt agreed to d/c Pt today with plan for Pt to contact referring MD to see about a referral back to PT to address LBP, bil hip pain and Lt knee pain so that she will be able to tolerate greater activity with less pain and hopefully in doing so build her confidence for balance back up.    Comorbidities  Lumbar fusion;  left TKR with poor result; HTN; diabetes; asthma; osteopenia; restless legs    Rehab Potential  Good    PT Frequency  2x / week    PT Duration  8 weeks    PT Treatment/Interventions  ADLs/Self Care Home Management;Therapeutic exercise;Therapeutic activities;Balance training;Neuromuscular re-education;Patient/family education    PT Next Visit Plan  d/c to HEP and f/u with MD to see about  referral for PT for LBP/hip/knee pain    PT Home Exercise Plan  Access Code: [email or web address]    Consulted and Agree with Plan of Care  Patient       Patient will benefit from skilled therapeutic intervention in order to improve the following deficits and impairments:  Difficulty walking, Decreased activity tolerance, Decreased balance, Decreased strength  Visit Diagnosis: Difficulty in walking, not elsewhere classified  Unspecified lack of coordination  Repeated falls  Muscle weakness (generalized)     Problem List Patient Active Problem List   Diagnosis Date Noted  . Carotid arterial disease ([place]) 01/23/2019  . RBBB (right bundle branch block with left anterior fascicular block)   . Chest pain 01/22/2019  . Weakness of both lower extremities 01/02/2019  . Frequent falls 01/02/2019  . Cellulitis of right elbow 12/29/2018  . OSA on CPAP 06/15/2017  . Diabetes ([place]) 03/02/2017  . Herpes zoster without complication 53/97/6734  . Prediabetes 02/23/2017  . Shortness of breath on exertion 02/23/2017  . B12 nutritional deficiency 02/23/2017  . Vitamin D deficiency 12/08/2016  . Tubular adenoma of colon 04/29/2016  . Essential hypertension 09/12/2015  . Allergic rhinitis 07/17/2015  . Insomnia 07/17/2015  . Obese 04/10/2015  . S/P left TKA 04/09/2015  . Osteopenia 12/04/2012  . GERD (gastroesophageal reflux disease) 02/07/2012  . 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 RESTLESS LEG SYNDROME 02/11/2009  . LEG EDEMA, BILATERAL 02/11/2009  . Hyperlipidemia 10/30/2008  . Urinary incontinence 10/30/2008  . Skin cancer 10/30/2008  . Asthma 11/21/2007  . Venous (peripheral) insufficiency 11/22/2006   PHYSICAL THERAPY DISCHARGE SUMMARY  Visits from Start of Care: 9  Current functional level related to goals / functional outcomes: See above   Remaining deficits: See above   Education /  Equipment: HEP Plan: Patient agrees to discharge.  Patient goals were met. Patient is being  discharged due to meeting the stated rehab goals.  ?????         Baruch Merl, PT 03/07/19 2:50 PM     [facility] [address], [place], [place], [address] Phone: [phone]   Fax:  [phone]  Name: CATRENA VARI MRN: [id number] Date of Birth: August 31, 1940

## 2019-03-09 ENCOUNTER — Other Ambulatory Visit: Payer: Self-pay

## 2019-03-09 ENCOUNTER — Ambulatory Visit: Payer: Medicare Other | Admitting: Physical Therapy

## 2019-03-09 ENCOUNTER — Encounter: Payer: Self-pay | Admitting: Physical Therapy

## 2019-03-09 DIAGNOSIS — M6281 Muscle weakness (generalized): Secondary | ICD-10-CM

## 2019-03-09 DIAGNOSIS — M79621 Pain in right upper arm: Secondary | ICD-10-CM

## 2019-03-09 DIAGNOSIS — R262 Difficulty in walking, not elsewhere classified: Secondary | ICD-10-CM

## 2019-03-09 DIAGNOSIS — M25552 Pain in left hip: Secondary | ICD-10-CM

## 2019-03-09 DIAGNOSIS — G8929 Other chronic pain: Secondary | ICD-10-CM

## 2019-03-09 DIAGNOSIS — M545 Low back pain, unspecified: Secondary | ICD-10-CM

## 2019-03-09 DIAGNOSIS — M25551 Pain in right hip: Secondary | ICD-10-CM

## 2019-03-09 NOTE — Therapy (Signed)
[facility] [address] [place], [place], [address] Phone: [phone]   Fax:  [phone]  Physical Therapy Evaluation  Patient Details  Name: Erin Roach MRN: [id number] Date of Birth: February 23, 1941 Referring Provider (PT): Dr. Billey Roach   Encounter Date: 03/09/2019  PT End of Session - 03/09/19 2041    Visit Number  1    Date for PT Re-Evaluation  05/04/19    Authorization Type  [employer] Medicare;  KX starting at visit 5 secondary to previous PT    PT Start Time  1232    PT Stop Time  1315    PT Time Calculation (min)  43 min    Activity Tolerance  Patient tolerated treatment well       Past Medical History:  Diagnosis Date  . Allergic rhinitis   . Apnea   . Arthritis   . Asthma    very mild  . Back pain   . Bilateral lower extremity edema   . Carotid artery disease ([place])    a. Carotid duplex 03/2016 - duplex was stable, 123456 RICA, 123456 LICA  . Complication of anesthesia    post-op delirium  . Endometrial polyp   . Essential hypertension   . GERD (gastroesophageal reflux disease)   . Heart murmur    per pt  . History of adenomatous polyp of colon    04-24-2016  tubular adenoma  . History of squamous cell carcinoma excision    pilonidal area second excision 02-02-2002  . HTN (hypertension)   . Hyperlipidemia   . Leg pain   . OAB (overactive bladder)   . RA (rheumatoid arthritis) ([place])   . RBBB (right bundle branch block with left anterior fascicular block)   . RLS (restless legs syndrome)   . Shortness of breath on exertion   . SUI (stress urinary incontinence, female)   . Venous insufficiency    legs  . Vitamin D deficiency     Past Surgical History:  Procedure Laterality Date  . APPENDECTOMY  07-02-2005   dr Erin Roach   open  . CARDIAC CATHETERIZATION  01-23-2009  dr Erin Roach   normal coronary arteries and lvf  . CARDIOVASCULAR STRESS TEST  03/31/2016   Low risk nuclear study  w/ medium defect of mild severity in basal anterior and mid anterior location w/ no evidence ischemia or infarction/  normal LV function and wall motion,  nuclear stress ef 71%  . CATARACT EXTRACTION W/ INTRAOCULAR LENS IMPLANT Left 09/2016  . COLONOSCOPY  last one 04-24-2016  . HYSTEROSCOPY W/D&C N/A 12/22/2016   Procedure: DILATATION AND CURETTAGE /HYSTEROSCOPY;  Surgeon: Erin Queen, MD;  Location: [facility];  Service: Gynecology;  Laterality: N/A;  . IR US GUIDANCE  09/08/2016  . POSTERIOR LUMBAR FUSION  02/ 2018    [facility] ([place])  . SKIN CANCER EXCISION    . TONSILLECTOMY  child  . TOTAL KNEE ARTHROPLASTY Left 04/09/2015   Procedure: LEFT TOTAL KNEE ARTHROPLASTY;  Surgeon: Erin Cancel, MD;  Location: [facility];  Service: Orthopedics;  Laterality: Left;  . TRANSTHORACIC ECHOCARDIOGRAM  01/21/2009   mild LVH, ef Q000111Q, grade 1 diastolic dysfunction  . ULNAR NERVE TRANSPOSITION Left 12-20-2008  dr Erin Roach   decompression and partial resection medial triceps fascia  . WIDE LOCAL EXCISION PILONIDAL AREA  02-02-2002  dr Erin Roach   recurrent squamous cell carcinoma in situ    There were no vitals filed for this visit.  Subjective Assessment - 03/09/19 1235    Subjective  Bil hip, low back pain, hip pain, left knee pain, right upper arm pain chronic problem since my knee was replaced.    Bridging helps, SLRs feels better.  Saw the doctor in [place] regarding restless legs.    Pertinent History  "Erin Roach";  left TKR;  lumbar fusion; restless legs, HTN; diabetes;  mild neuropathy;  osteopenia    How long can you stand comfortably?  2 minutes    How long can you walk comfortably?  5 min then hurts hips    Patient Stated Goals  I would like to feel better, be more active; better quality of life    Currently in Pain?  Yes    Pain Score  3     Pain Location  Leg    Pain Orientation  Right    Pain Type  Chronic pain    Aggravating Factors   standing, walking     Pain Relieving Factors  sitting         [facility] PT Assessment - 03/09/19 0001      Assessment   Medical Diagnosis  LBP; hip pain, left knee pain; right upper arm pain     Referring Provider (PT)  Dr. Billey Roach    Onset Date/Surgical Date  --   > 6 months    Next MD Visit  as needed     Prior Therapy  in past for TKR;  fusion; recently at [facility] for balance       Precautions   Precautions  Fall      Restrictions   Weight Bearing Restrictions  No      Balance Screen   Has the patient fallen in the past 6 months  --   no falls in last few months      [place] residence    Living Arrangements  Spouse/significant other    Type of [place] to enter    [place]  Two level;Able to live on main level with bedroom/bathroom      Prior Function   Level of Independence  Independent    Vocation  Retired    Leisure  read books ; visit friends, family       Observation/Other Assessments   Focus on Therapeutic Outcomes (FOTO)   60% limitation       AROM   Overall AROM Comments  bil UE ROM is WFLS without complaints of pain today.      Right Hip Extension  5    Right Hip Flexion  110    Right Hip External Rotation   30    Right Hip Internal Rotation   10    Left Hip Extension  5    Left Hip Flexion  110    Left Hip External Rotation   30    Left Hip Internal Rotation   10    Left Knee Flexion  121    Lumbar Flexion  50     Lumbar Extension  20       Strength   Overall Strength Comments  bil shoulder strength grossly 4/5     Right Hip Flexion  4+/5    Right Hip ABduction  3+/5    Left Hip Flexion  4+/5    Left Hip ABduction  3+/5    Right Knee Flexion  4+/5    Right Knee  Extension  4+/5    Left Knee Flexion  4+/5    Left Knee Extension  4+/5    Lumbar Flexion  3+/5    Lumbar Extension  3+/5      Flexibility   Soft Tissue Assessment /Muscle Length  yes    Hamstrings  HS bil 65 degrees     Quadriceps   decreased bil;  left > right     ITB  decreased bil       Palpation   Palpation comment  left lateral quads, HS;  bil glutes, piriformis      Slump test   Findings  Negative      Straight Leg Raise   Findings  Negative      Thomas Test    Findings  Positive    Comments  left > right       Hip Scouring   Findings  Negative                Objective measurements completed on examination: See above findings.              PT Education - 03/09/19 1319    Education Details  Access Code: [email or web address] HS supine with strap with biases;  knee to opp shoulder stretch;  seated HS stretch    Person(s) Educated  Patient    Methods  Explanation;Demonstration;Handout    Comprehension  Returned demonstration;Verbalized understanding       PT Short Term Goals - 03/09/19 2052      PT SHORT TERM GOAL #1   Title  independent with initial HEP for mobility and strengthening    Time  4    Period  Weeks    Status  New    Target Date  04/06/19      PT SHORT TERM GOAL #2   Title  The patient will have improved lumbar flexion to 60 degrees and HS muscle lengths to 70 degrees needed for improved household and community mobility    Time  4    Period  Weeks    Status  New      PT SHORT TERM GOAL #3   Title  The patient will be able to stand for 3-4 minutes for washing dishes at the sink    Time  4    Period  Weeks    Status  New      PT SHORT TERM GOAL #4   Title  The patient will report back, hip, knee and right upper arm pain about 30% improved with daily activities    Time  4    Period  Weeks    Status  New        PT Long Term Goals - 03/09/19 2055      PT LONG TERM GOAL #1   Title  independent with advanced HEP    Time  8    Period  Weeks    Status  New    Target Date  05/04/19      PT LONG TERM GOAL #2   Title  The patient will be able to to walk puppy 8-12 min with 50% less pain    Time  8    Period  Weeks    Status  New      PT LONG TERM GOAL #3    Title  The patient will have improved lumbo/pelvic/shoulder/periscapular core strength grossly 4+/5 needed for walking and standing longer periods of time    Time  8    Period  Weeks    Status  New      PT LONG TERM GOAL #4   Title  The patient will have improved bil hip abduction/gluteal strength to 4/5 needed for standing >5 minutes    Time  8    Period  Weeks    Status  New      PT LONG TERM GOAL #5   Title  FOTO functional outcome score improved from 60% limitation to 51%    Time  8    Period  Weeks    Status  New      PT LONG TERM GOAL #6   Title  ...      PT LONG TERM GOAL #7   Title  .Marland KitchenMarland Kitchen             Plan - 03/09/19 1319    Clinical Impression Statement  The patient is a previous patient of this facility for treatment of balance and gait instability.  She has now been referred back to PT for evaluation and treatment of LBP, hip pain, left knee pain and right upper arm pain.  Her pain is chronic with reports of worsenening of symptoms over time.  Her standing tolerance is only 2 minutes and walking tolerance is 5 minutes.  Decreased lumbar ROM as well as hip mobility especially hip internal and external rotation and hip extension.  Decreased muscle lengths of bil HS, hip flexors and ITB.  Significant weakness in bil hip abductors. Decreased core strength and shoulder/periscapular strength.   Tender points in left HS, quads and bil glutes and piriformis muscles.  She would benefit from PT to address these deficits.    Personal Factors and Comorbidities  Age;Past/Current Experience;Comorbidity 1;Comorbidity 2;Comorbidity 3+    Comorbidities  Lumbar fusion;  left TKR with poor result; HTN; diabetes; asthma; osteopenia; restless legs    Examination-Activity Limitations  Locomotion Level;Lift;Other;Stairs;Sleep    Examination-Participation Restrictions  Community Activity;Other;Shop;Meal Prep    Stability/Clinical Decision Making  Evolving/Moderate complexity    Clinical  Decision Making  Moderate    Rehab Potential  Good    PT Frequency  2x / week    PT Duration  8 weeks    PT Treatment/Interventions  ADLs/Self Care Home Management;Therapeutic exercise;Therapeutic activities;Balance training;Neuromuscular re-education;Patient/family education;Dry needling;Taping;Moist Heat;Cryotherapy;Electrical Stimulation;Ultrasound;Manual techniques;Traction    PT Next Visit Plan  pt interested in DN to gluteals, piriformis, lateral HS, lateral quads, ITB;  hip joint mobs;  add to HEP for LE stretching;  start transverse abdominus activation ex's    PT Home Exercise Plan  Access Code: [email or web address]       Patient will benefit from skilled therapeutic intervention in order to improve the following deficits and impairments:  Difficulty walking, Increased fascial restricitons, Increased muscle spasms, Impaired UE functional use, Pain, Impaired perceived functional ability, Decreased activity tolerance, Impaired flexibility, Decreased strength  Visit Diagnosis: Chronic bilateral low back pain without sciatica - Plan: PT plan of care cert/re-cert  Pain in left hip - Plan: PT plan of care cert/re-cert  Pain in right hip - Plan: PT plan of care cert/re-cert  Pain in right upper arm - Plan: PT plan of care cert/re-cert  Difficulty in walking, not elsewhere classified - Plan: PT plan of care cert/re-cert  Muscle weakness (generalized) - Plan: PT plan of care cert/re-cert     Problem List Patient Active Problem List   Diagnosis Date Noted  . Carotid arterial disease ([place]) 01/23/2019  . RBBB (right  bundle branch block with left anterior fascicular block)   . Chest pain 01/22/2019  . Weakness of both lower extremities 01/02/2019  . Frequent falls 01/02/2019  . Cellulitis of right elbow 12/29/2018  . OSA on CPAP 06/15/2017  . Diabetes ([place]) 03/02/2017  . Herpes zoster without complication 123456  . Prediabetes 02/23/2017  . Shortness of breath on exertion 02/23/2017  .  B12 nutritional deficiency 02/23/2017  . Vitamin D deficiency 12/08/2016  . Tubular adenoma of colon 04/29/2016  . Essential hypertension 09/12/2015  . Allergic rhinitis 07/17/2015  . Insomnia 07/17/2015  . Obese 04/10/2015  . S/P left TKA 04/09/2015  . Osteopenia 12/04/2012  . GERD (gastroesophageal reflux disease) 02/07/2012  . 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 RESTLESS LEG SYNDROME 02/11/2009  . LEG EDEMA, BILATERAL 02/11/2009  . Hyperlipidemia 10/30/2008  . Urinary incontinence 10/30/2008  . Skin cancer 10/30/2008  . Asthma 11/21/2007  . Venous (peripheral) insufficiency 11/22/2006   Ruben Im, PT 03/09/19 9:05 PM Phone: [phone] Fax: [phone] Alvera Singh 03/09/2019, 9:04 PM  [facility] [address] [place], [place], [address] Phone: [phone]   Fax:  [phone]  Name: Erin Roach MRN: [id number] Date of Birth: [birth year]

## 2019-03-09 NOTE — Patient Instructions (Signed)
Access Code: [email or web address]  URL: [email or web address]  Date: 03/09/2019  Prepared by: Ruben Im   Exercises  Supine Piriformis Stretch with Foot on Ground - 3 reps - 1 sets - 30 hold - 1x daily - 7x weekly  Hooklying Hamstring Stretch with Strap - 3 reps - 1 sets - 30 hold - 1x daily - 7x weekly  Seated Hamstring Stretch - 3 reps - 1 sets - 30 hold - 1x daily - 7x weekly

## 2019-03-22 ENCOUNTER — Ambulatory Visit: Payer: Medicare Other | Attending: Internal Medicine | Admitting: Physical Therapy

## 2019-03-22 ENCOUNTER — Other Ambulatory Visit: Payer: Self-pay

## 2019-03-22 ENCOUNTER — Encounter: Payer: Self-pay | Admitting: Physical Therapy

## 2019-03-22 DIAGNOSIS — M6281 Muscle weakness (generalized): Secondary | ICD-10-CM | POA: Diagnosis present

## 2019-03-22 DIAGNOSIS — R262 Difficulty in walking, not elsewhere classified: Secondary | ICD-10-CM | POA: Diagnosis present

## 2019-03-22 DIAGNOSIS — M545 Low back pain, unspecified: Secondary | ICD-10-CM

## 2019-03-22 DIAGNOSIS — G8929 Other chronic pain: Secondary | ICD-10-CM | POA: Diagnosis present

## 2019-03-22 DIAGNOSIS — M62838 Other muscle spasm: Secondary | ICD-10-CM | POA: Diagnosis present

## 2019-03-22 DIAGNOSIS — M25562 Pain in left knee: Secondary | ICD-10-CM | POA: Diagnosis present

## 2019-03-22 DIAGNOSIS — M25551 Pain in right hip: Secondary | ICD-10-CM

## 2019-03-22 NOTE — Therapy (Signed)
[facility] [address] [place], [place], [address] Phone: [phone]   Fax:  [phone]  Physical Therapy Treatment  Patient Details  Name: Erin Roach MRN: [id number] Date of Birth: [birth year] Referring Provider (PT): Dr. Billey Gosling   Encounter Date: 03/22/2019  PT End of Session - 03/22/19 1234    Visit Number  2    Date for PT Re-Evaluation  05/04/19    Authorization Type  UHC Medicare;  KX starting at visit 5 secondary to previous PT    PT Start Time  1145    PT Stop Time  1230    PT Time Calculation (min)  45 min    Activity Tolerance  Patient tolerated treatment well;No increased pain    Behavior During Therapy  WFL for tasks assessed/performed       Past Medical History:  Diagnosis Date  . Allergic rhinitis   . Apnea   . Arthritis   . Asthma    very mild  . Back pain   . Bilateral lower extremity edema   . Carotid artery disease ([place])    a. Carotid duplex 03/2016 - duplex was stable, 123456 RICA, 123456 LICA  . Complication of anesthesia    post-op delirium  . Endometrial polyp   . Essential hypertension   . GERD (gastroesophageal reflux disease)   . Heart murmur    per pt  . History of adenomatous polyp of colon    04-24-2016  tubular adenoma  . History of squamous cell carcinoma excision    pilonidal area second excision 02-02-2002  . HTN (hypertension)   . Hyperlipidemia   . Leg pain   . OAB (overactive bladder)   . RA (rheumatoid arthritis) ([place])   . RBBB (right bundle branch block with left anterior fascicular block)   . RLS (restless legs syndrome)   . Shortness of breath on exertion   . SUI (stress urinary incontinence, female)   . Venous insufficiency    legs  . Vitamin D deficiency     Past Surgical History:  Procedure Laterality Date  . APPENDECTOMY  07-02-2005   [name]   open  . CARDIAC CATHETERIZATION  01-23-2009  dr Darnell Level brodie   normal coronary  arteries and lvf  . CARDIOVASCULAR STRESS TEST  03/31/2016   Low risk nuclear study w/ medium defect of mild severity in basal anterior and mid anterior location w/ no evidence ischemia or infarction/  normal LV function and wall motion,  nuclear stress ef 71%  . CATARACT EXTRACTION W/ INTRAOCULAR LENS IMPLANT Left 09/2016  . COLONOSCOPY  last one 04-24-2016  . HYSTEROSCOPY W/D&C N/A 12/22/2016   Procedure: DILATATION AND CURETTAGE /HYSTEROSCOPY;  Surgeon: Dian Queen, MD;  [facility]: [facility];  Service: Gynecology;  Laterality: N/A;  . IR US GUIDANCE  09/08/2016  . POSTERIOR LUMBAR FUSION  02/ 2018    [facility] ([place])  . SKIN CANCER EXCISION    . TONSILLECTOMY  child  . 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 TRANSTHORACIC ECHOCARDIOGRAM  01/21/2009   mild LVH, ef Q000111Q, grade 1 diastolic dysfunction  . ULNAR NERVE TRANSPOSITION Left 12-20-2008  dr sypher   decompression and partial resection medial triceps fascia  . WIDE LOCAL EXCISION PILONIDAL AREA  02-02-2002  [name]   recurrent squamous cell carcinoma in  situ    There were no vitals filed for this visit.  Subjective Assessment - 03/22/19 1148    Subjective  I bought a machine that moves my legs while I sit to help me with my leg swelling and restless legs.  I used it for two days for 45 min and ended up with a charlie horse in Rt hamstring and finally today am not sore from that.  I do think it helped my swelling but not sure if it helps the restless legs.  Last night was bad for them.    Pertinent History  "Erin Roach";  left TKR;  lumbar fusion; restless legs, HTN; diabetes;  mild neuropathy;  osteopenia    Limitations  House hold activities    How long can you stand comfortably?  2 minutes    How long can you walk comfortably?  5 min then hurts hips     Diagnostic tests  none    Patient Stated Goals  I would like to feel better, be more active; better quality of life    Currently in Pain?  Yes    Pain Score  1     Pain Location  Leg    Pain Orientation  Left;Right;Proximal;Posterior    Pain Descriptors / Indicators  Aching    Pain Type  Chronic pain    Pain Onset  More than a month ago    Pain Frequency  Constant    Aggravating Factors   standing/walking    Pain Relieving Factors  sitting                       [facility] Adult PT Treatment/Exercise - 03/22/19 0001      Self-Care   Self-Care  Other Self-Care Comments    Other Self-Care Comments   reminders for DN aftercare      Exercises   Exercises  Lumbar      Lumbar Exercises: Stretches   Active Hamstring Stretch  Right;1 rep;30 seconds    Active Hamstring Stretch Limitations  seated, after DN    Piriformis Stretch  Right;1 rep;30 seconds    Piriformis Stretch Limitations  seated edge of table, after DN      Lumbar Exercises: Aerobic   Nustep  L1 x 7', PT present to monitor, after DN end of session       Trigger Point Dry Needling - 03/22/19 0001    Consent Given?  Yes    Education Handout Provided  No    Muscles Treated Lower Quadrant  Hamstring    Muscles Treated Back/Hip  Gluteus medius;Piriformis    Hamstring Response  Twitch response elicited;Palpable increased muscle length    Gluteus Medius Response  Twitch response elicited;Palpable increased muscle length    Piriformis Response  Twitch response elicited;Palpable increased muscle length             PT Short Term Goals - 03/09/19 2052      PT SHORT TERM GOAL #1   Title  independent with initial HEP for mobility and strengthening    Time  4    Period  Weeks    Status  New    Target Date  04/06/19      PT SHORT TERM GOAL #2   Title  The patient will have improved lumbar flexion to 60 degrees and HS muscle lengths to 70 degrees needed for improved household and community mobility    Time   4    Period  Weeks    Status  New      PT SHORT TERM GOAL #3   Title  The patient will be able to stand for 3-4 minutes for washing dishes at the sink    Time  4    Period  Weeks    Status  New      PT SHORT TERM GOAL #4   Title  The patient will report back, hip, knee and right upper arm pain about 30% improved with daily activities    Time  4    Period  Weeks    Status  New        PT Long Term Goals - 03/09/19 2055      PT LONG TERM GOAL #1   Title  independent with advanced HEP    Time  8    Period  Weeks    Status  New    Target Date  05/04/19      PT LONG TERM GOAL #2   Title  The patient will be able to to walk puppy 8-12 min with 50% less pain    Time  8    Period  Weeks    Status  New      PT LONG TERM GOAL #3   Title  The patient will have improved lumbo/pelvic/shoulder/periscapular core strength grossly 4+/5 needed for walking and standing longer periods of time    Time  8    Period  Weeks    Status  New      PT LONG TERM GOAL #4   Title  The patient will have improved bil hip abduction/gluteal strength to 4/5 needed for standing >5 minutes    Time  8    Period  Weeks    Status  New      PT LONG TERM GOAL #5   Title  FOTO functional outcome score improved from 60% limitation to 51%    Time  8    Period  Weeks    Status  New      PT LONG TERM GOAL #6   Title  ...      PT LONG TERM GOAL #7   Title  ...            Plan - 03/22/19 1235    Clinical Impression Statement  Pt with signif twitch response and release with DN to Rt lateral hip and posterior thigh today.  PT targeted trigger points with DN and followed up with stretching of these muscles and gentle movement on NuStep end of session.  Pt reported signif improvement in tension and pain/soreness with walking and standing end of session and was very pleased with her first dry needling experience.  PT reminded her of aftercare for DN.  She will continue to benefit from skilled PT along  current POC.    Comorbidities  Lumbar fusion;  left TKR with poor result; HTN; diabetes; asthma; osteopenia; restless legs    Rehab Potential  Good    PT Frequency  2x / week    PT Duration  8 weeks    PT Treatment/Interventions  ADLs/Self Care Home Management;Therapeutic exercise;Therapeutic activities;Balance training;Neuromuscular re-education;Patient/family education;Dry needling;Taping;Moist Heat;Cryotherapy;Electrical Stimulation;Ultrasound;Manual techniques;Traction    PT Next Visit Plan  f/u on DN to Rt lateral hip and hamstring, perform DN to lateral quads/ITB on Rt, continue LE stretches and intro TrA, add to HEP    PT Home Exercise Plan  Access Code: [email or web address]    Consulted and  Agree with Plan of Care  Patient       Patient will benefit from skilled therapeutic intervention in order to improve the following deficits and impairments:  Difficulty walking, Increased fascial restricitons, Increased muscle spasms, Impaired UE functional use, Pain, Impaired perceived functional ability, Decreased activity tolerance, Impaired flexibility, Decreased strength  Visit Diagnosis: Chronic bilateral low back pain without sciatica  Pain in right hip  Difficulty in walking, not elsewhere classified  Muscle weakness (generalized)  Other muscle spasm     Problem List Patient Active Problem List   Diagnosis Date Noted  . Carotid arterial disease ([place]) 01/23/2019  . RBBB (right bundle branch block with left anterior fascicular block)   . Chest pain 01/22/2019  . Weakness of both lower extremities 01/02/2019  . Frequent falls 01/02/2019  . Cellulitis of right elbow 12/29/2018  . OSA on CPAP 06/15/2017  . Diabetes ([place]) 03/02/2017  . Herpes zoster without complication 123456  . Prediabetes 02/23/2017  . Shortness of breath on exertion 02/23/2017  . B12 nutritional deficiency 02/23/2017  . Vitamin D deficiency 12/08/2016  . Tubular adenoma of colon 04/29/2016  . Essential  hypertension 09/12/2015  . Allergic rhinitis 07/17/2015  . Insomnia 07/17/2015  . Obese 04/10/2015  . S/P left TKA 04/09/2015  . Osteopenia 12/04/2012  . GERD (gastroesophageal reflux disease) 02/07/2012  . 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 RESTLESS LEG SYNDROME 02/11/2009  . LEG EDEMA, BILATERAL 02/11/2009  . Hyperlipidemia 10/30/2008  . Urinary incontinence 10/30/2008  . Skin cancer 10/30/2008  . Asthma 11/21/2007  . Venous (peripheral) insufficiency 11/22/2006    Baruch Merl, PT 03/22/19 12:39 PM   [facility] [address] [place], [place], [address] Phone: [phone]   Fax:  [phone]  Name: Erin Roach MRN: [id number] Date of Birth: Jul 29, 1940

## 2019-03-27 ENCOUNTER — Ambulatory Visit: Payer: Medicare Other | Admitting: Physical Therapy

## 2019-03-27 ENCOUNTER — Other Ambulatory Visit: Payer: Self-pay

## 2019-03-27 ENCOUNTER — Encounter: Payer: Self-pay | Admitting: Physical Therapy

## 2019-03-27 DIAGNOSIS — M545 Low back pain, unspecified: Secondary | ICD-10-CM

## 2019-03-27 DIAGNOSIS — M25551 Pain in right hip: Secondary | ICD-10-CM

## 2019-03-27 DIAGNOSIS — R262 Difficulty in walking, not elsewhere classified: Secondary | ICD-10-CM

## 2019-03-27 DIAGNOSIS — M6281 Muscle weakness (generalized): Secondary | ICD-10-CM

## 2019-03-27 DIAGNOSIS — M62838 Other muscle spasm: Secondary | ICD-10-CM

## 2019-03-27 DIAGNOSIS — G8929 Other chronic pain: Secondary | ICD-10-CM

## 2019-03-27 NOTE — Patient Instructions (Signed)
Access Code: [email or web address]  URL: [email or web address]  Date: 03/27/2019  Prepared by: Venetia Night Alaric Gladwin   Exercises  Supine Piriformis Stretch with Foot on Ground - 3 reps - 1 sets - 30 hold - 1x daily - 7x weekly  Hooklying Hamstring Stretch with Strap - 3 reps - 1 sets - 30 hold - 1x daily - 7x weekly  Seated Hamstring Stretch - 3 reps - 1 sets - 30 hold - 1x daily - 7x weekly  Quadriceps Stretch with Chair - 2 reps - 1 sets - 30 hold - 1x daily - 7x weekly  Sidelying Transversus Abdominis Bracing - 10 reps - 2 sets - 10 hold - 1x daily - 7x weekly  Clamshell - 10 reps - 2 sets - 1x daily - 7x weekly

## 2019-03-27 NOTE — Therapy (Signed)
[facility] [address] [place], [place], [address] Phone: [phone]   Fax:  [phone]  Physical Therapy Treatment  Patient Details  Name: Erin Roach MRN: [id number] Date of Birth: 09/13/1940 Referring Provider (PT): Dr. Billey Gosling   Encounter Date: 03/27/2019  PT End of Session - 03/27/19 1149    Visit Number  3    Date for PT Re-Evaluation  05/04/19    Authorization Type  UHC Medicare;  KX starting at visit 5 secondary to previous PT    PT Start Time  1100    PT Stop Time  1145    PT Time Calculation (min)  45 min    Activity Tolerance  Patient tolerated treatment well;No increased pain    Behavior During Therapy  WFL for tasks assessed/performed       Past Medical History:  Diagnosis Date  . Allergic rhinitis   . Apnea   . Arthritis   . Asthma    very mild  . Back pain   . Bilateral lower extremity edema   . Carotid artery disease ([place])    a. Carotid duplex 03/2016 - duplex was stable, [id number] RICA, 123456 LICA  . Complication of anesthesia    post-op delirium  . Endometrial polyp   . Essential hypertension   . GERD (gastroesophageal reflux disease)   . Heart murmur    per pt  . History of adenomatous polyp of colon    04-24-2016  tubular adenoma  . History of squamous cell carcinoma excision    pilonidal area second excision 02-02-2002  . HTN (hypertension)   . Hyperlipidemia   . Leg pain   . OAB (overactive bladder)   . RA (rheumatoid arthritis) ([place])   . RBBB (right bundle branch block with left anterior fascicular block)   . RLS (restless legs syndrome)   . Shortness of breath on exertion   . SUI (stress urinary incontinence, female)   . Venous insufficiency    legs  . Vitamin D deficiency     Past Surgical History:  Procedure Laterality Date  . APPENDECTOMY  07-02-2005   [name]   open  . CARDIAC CATHETERIZATION  01-23-2009  dr Darnell Level brodie   normal coronary  arteries and lvf  . CARDIOVASCULAR STRESS TEST  03/31/2016   Low risk nuclear study w/ medium defect of mild severity in basal anterior and mid anterior location w/ no evidence ischemia or infarction/  normal LV function and wall motion,  nuclear stress ef 71%  . CATARACT EXTRACTION W/ INTRAOCULAR LENS IMPLANT Left 09/2016  . COLONOSCOPY  last one 04-24-2016  . HYSTEROSCOPY W/D&C N/A 12/22/2016   Procedure: DILATATION AND CURETTAGE /HYSTEROSCOPY;  Surgeon: Dian Queen, MD;  Location[facility];  Service: Gynecology;  Laterality: N/A;  . IR US GUIDANCE  09/08/2016  . POSTERIOR LUMBAR FUSION  02/ 2018    [facility] ([place], [place])  . SKIN CANCER EXCISION    . TONSILLECTOMY  child  . 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 TRANSTHORACIC ECHOCARDIOGRAM  01/21/2009   mild LVH, ef Q000111Q, grade 1 diastolic dysfunction  . ULNAR NERVE TRANSPOSITION Left 12-20-2008  dr sypher   decompression and partial resection medial triceps fascia  . WIDE LOCAL EXCISION PILONIDAL AREA  02-02-2002  [name]   recurrent squamous cell carcinoma in  situ    There were no vitals filed for this visit.  Subjective Assessment - 03/27/19 1104    Subjective  I really liked the DN and felt immediate improvement in movement and pain in Rt LE last visit.  It lasted about a day and returned to baseline but I'm encouraged.    Pertinent History  "[place]";  left TKR;  lumbar fusion; restless legs, HTN; diabetes;  mild neuropathy;  osteopenia    Limitations  House hold activities    How long can you stand comfortably?  2 minutes    How long can you walk comfortably?  5 min then hurts hips    Diagnostic tests  none    Patient Stated Goals  I would like to feel better, be more active; better quality of life    Currently in Pain?  Yes    Pain Score  5     Pain  Location  Hip    Pain Orientation  Right;Lateral;Posterior    Pain Descriptors / Indicators  Aching    Pain Type  Chronic pain    Pain Onset  More than a month ago    Pain Frequency  Intermittent    Aggravating Factors   stand/walk    Pain Relieving Factors  sitting                       [facility] Adult PT Treatment/Exercise - 03/27/19 0001      Neuro Re-ed    Neuro Re-ed Details   sidelying PF VC to lift and palpate for TrA drawing in, 10x5 sec holds, work up to 10 sec holds for HEP, added clam A/ROM on Rt      Lumbar Exercises: Public affairs consultant Limitations  added to HEP on Rt, ran out of time to try it during session       Trigger Point Dry Needling - 03/27/19 0001    Consent Given?  Yes    Education Handout Provided  Previously provided    Muscles Treated Lower Quadrant  Vastus lateralis;Gastrocnemius    Muscles Treated Back/Hip  Gluteus medius;Gluteus maximus;Piriformis    Other Dry Needling  all on RIGHT, ITB where it blends with lateral quad    Vastus lateralis Response  Twitch response elicited;Palpable increased muscle length    Gastrocnemius Response  Twitch response elicited;Palpable increased muscle length   medial and lateral head, Rt   Gluteus Medius Response  Twitch response elicited;Palpable increased muscle length    Gluteus Maximus Response  Twitch response elicited;Palpable increased muscle length    Piriformis Response  Twitch response elicited;Palpable increased muscle length           PT Education - 03/27/19 1149    Education Details  Access Code: [email or web address]    Person(s) Educated  Patient    Methods  Explanation;Demonstration;Handout    Comprehension  Verbalized understanding;Returned demonstration       PT Short Term Goals - 03/27/19 1155      PT SHORT TERM GOAL #1   Title  independent with initial HEP for mobility and strengthening    Status  On-going        PT Long Term Goals - 03/09/19 2055      PT LONG TERM GOAL #1    Title  independent with advanced HEP    Time  8    Period  Weeks    Status  New    Target Date  05/04/19  PT LONG TERM GOAL #2   Title  The patient will be able to to walk puppy 8-12 min with 50% less pain    Time  8    Period  Weeks    Status  New      PT LONG TERM GOAL #3   Title  The patient will have improved lumbo/pelvic/shoulder/periscapular core strength grossly 4+/5 needed for walking and standing longer periods of time    Time  8    Period  Weeks    Status  New      PT LONG TERM GOAL #4   Title  The patient will have improved bil hip abduction/gluteal strength to 4/5 needed for standing >5 minutes    Time  8    Period  Weeks    Status  New      PT LONG TERM GOAL #5   Title  FOTO functional outcome score improved from 60% limitation to 51%    Time  8    Period  Weeks    Status  New      PT LONG TERM GOAL #6   Title  ...      PT LONG TERM GOAL #7   Title  ...            Plan - 03/27/19 1149    Clinical Impression Statement  Pt with relief after last visit x approx 1 day with DN to lateral hip and hamstring.  PT revisited DN to lateral hip again with noted improved tenderness and more mild reaction this visit to gluteals and piriformis.  Pt with signif reaction to ITB and lateral quad, medial gastroc > lateral gastroc with good release.  Pt sore afterwards but was encouraged.  PT reiterated DN after care.  Pt with good co-contraction of PF with TrA in sidelying today.  PT added core and core with clamshell to HEP and encouraged her to use these muscles in everyday static and dynamic postures/activities as able.  Continue current POC with ongoing assessment to response to treatment.    Comorbidities  Lumbar fusion;  left TKR with poor result; HTN; diabetes; asthma; osteopenia; restless legs    Rehab Potential  Good    PT Frequency  2x / week    PT Duration  8 weeks    PT Treatment/Interventions  ADLs/Self Care Home Management;Therapeutic  exercise;Therapeutic activities;Balance training;Neuromuscular re-education;Patient/family education;Dry needling;Taping;Moist Heat;Cryotherapy;Electrical Stimulation;Ultrasound;Manual techniques;Traction    PT Next Visit Plan  f/u on DN to Rt lateral hip, ITB, lateral quad, medial and lateral gastroc.  Check Rt posterior tib and soleus for DN, revisit sidelying core/clam, progress gentle core, LE stretching    PT Home Exercise Plan  Access Code: [email or web address]    Consulted and Agree with Plan of Care  Patient       Patient will benefit from skilled therapeutic intervention in order to improve the following deficits and impairments:     Visit Diagnosis: Chronic bilateral low back pain without sciatica  Pain in right hip  Difficulty in walking, not elsewhere classified  Muscle weakness (generalized)  Other muscle spasm     Problem List Patient Active Problem List   Diagnosis Date Noted  . Carotid arterial disease ([place]) 01/23/2019  . RBBB (right bundle branch block with left anterior fascicular block)   . Chest pain 01/22/2019  . Weakness of both lower extremities 01/02/2019  . Frequent falls 01/02/2019  . Cellulitis of right elbow 12/29/2018  . OSA on CPAP 06/15/2017  .  Diabetes ([place]) 03/02/2017  . Herpes zoster without complication 123456  . Prediabetes 02/23/2017  . Shortness of breath on exertion 02/23/2017  . B12 nutritional deficiency 02/23/2017  . Vitamin D deficiency 12/08/2016  . Tubular adenoma of colon 04/29/2016  . Essential hypertension 09/12/2015  . Allergic rhinitis 07/17/2015  . Insomnia 07/17/2015  . Obese 04/10/2015  . S/P left TKA 04/09/2015  . Osteopenia 12/04/2012  . GERD (gastroesophageal reflux disease) 02/07/2012  . 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 RESTLESS LEG SYNDROME 02/11/2009  . LEG EDEMA, BILATERAL 02/11/2009  . Hyperlipidemia 10/30/2008  . Urinary incontinence 10/30/2008  . Skin cancer 10/30/2008  . Asthma 11/21/2007  . Venous (peripheral)  insufficiency 11/22/2006    Baruch Merl, PT 03/27/19 12:00 PM   [facility] [address] [place], [place], [address] Phone: [phone]   Fax:  [phone]  Name: Erin Roach MRN: [id number] Date of Birth: 18-Jan-1941

## 2019-03-30 ENCOUNTER — Ambulatory Visit: Payer: Medicare Other | Admitting: Physical Therapy

## 2019-03-30 ENCOUNTER — Encounter: Payer: Self-pay | Admitting: Physical Therapy

## 2019-03-30 ENCOUNTER — Other Ambulatory Visit: Payer: Self-pay

## 2019-03-30 DIAGNOSIS — M25551 Pain in right hip: Secondary | ICD-10-CM

## 2019-03-30 DIAGNOSIS — G8929 Other chronic pain: Secondary | ICD-10-CM

## 2019-03-30 DIAGNOSIS — M545 Low back pain, unspecified: Secondary | ICD-10-CM

## 2019-03-30 DIAGNOSIS — M6281 Muscle weakness (generalized): Secondary | ICD-10-CM

## 2019-03-30 DIAGNOSIS — R262 Difficulty in walking, not elsewhere classified: Secondary | ICD-10-CM

## 2019-03-30 NOTE — Patient Instructions (Signed)
Access Code: [email or web address]  URL: [email or web address]  Date: 03/30/2019  Prepared by: Ruben Im   Exercises  Supine Piriformis Stretch with Foot on Ground - 3 reps - 1 sets - 30 hold - 1x daily - 7x weekly  Hooklying Hamstring Stretch with Strap - 3 reps - 1 sets - 30 hold - 1x daily - 7x weekly  Seated Hamstring Stretch - 3 reps - 1 sets - 30 hold - 1x daily - 7x weekly  Quadriceps Stretch with Chair - 2 reps - 1 sets - 30 hold - 1x daily - 7x weekly  Sidelying Transversus Abdominis Bracing - 10 reps - 2 sets - 10 hold - 1x daily - 7x weekly  Clamshell - 10 reps - 2 sets - 1x daily - 7x weekly  Supine Lower Trunk Rotation - 5 reps - 1 sets - 15 hold - 1x daily - 7x weekly  Hooklying Single Knee to Chest - 5 reps - 1 sets - 15 hold - 1x daily - 7x weekly  Supine Double Knee to Chest - 5 reps - 1 sets - 15 hold - 1x daily - 7x weekly

## 2019-03-30 NOTE — Therapy (Signed)
[facility] [address] [place], [place], [address] Phone: [phone]   Fax:  [phone]  Physical Therapy Treatment  Patient Details  Name: Erin Roach MRN: [id number] Date of Birth: 04-28-1941 Referring Provider (PT): Dr. Billey Gosling   Encounter Date: 03/30/2019  PT End of Session - 03/30/19 1707    Visit Number  4    Date for PT Re-Evaluation  05/04/19    Authorization Type  UHC Medicare;  KX starting at visit 5 secondary to previous PT    PT Start Time  1530    PT Stop Time  1615    PT Time Calculation (min)  45 min    Activity Tolerance  Patient tolerated treatment well;No increased pain       Past Medical History:  Diagnosis Date  . Allergic rhinitis   . Apnea   . Arthritis   . Asthma    very mild  . Back pain   . Bilateral lower extremity edema   . Carotid artery disease ([place])    a. Carotid duplex 03/2016 - duplex was stable, [id number] RICA, 123456 LICA  . Complication of anesthesia    post-op delirium  . Endometrial polyp   . Essential hypertension   . GERD (gastroesophageal reflux disease)   . Heart murmur    per pt  . History of adenomatous polyp of colon    04-24-2016  tubular adenoma  . History of squamous cell carcinoma excision    pilonidal area second excision 02-02-2002  . HTN (hypertension)   . Hyperlipidemia   . Leg pain   . OAB (overactive bladder)   . RA (rheumatoid arthritis) ([place])   . RBBB (right bundle branch block with left anterior fascicular block)   . RLS (restless legs syndrome)   . Shortness of breath on exertion   . SUI (stress urinary incontinence, female)   . Venous insufficiency    legs  . Vitamin D deficiency     Past Surgical History:  Procedure Laterality Date  . APPENDECTOMY  07-02-2005   [name]   open  . CARDIAC CATHETERIZATION  01-23-2009  dr Darnell Level brodie   normal coronary arteries and lvf  . CARDIOVASCULAR STRESS TEST  03/31/2016   Low  risk nuclear study w/ medium defect of mild severity in basal anterior and mid anterior location w/ no evidence ischemia or infarction/  normal LV function and wall motion,  nuclear stress ef 71%  . CATARACT EXTRACTION W/ INTRAOCULAR LENS IMPLANT Left 09/2016  . COLONOSCOPY  last one 04-24-2016  . HYSTEROSCOPY W/D&C N/A 12/22/2016   Procedure: DILATATION AND CURETTAGE /HYSTEROSCOPY;  Surgeon: Dian Queen, MD;  Location: [facility];  Service: Gynecology;  Laterality: N/A;  . IR US GUIDANCE  09/08/2016  . POSTERIOR LUMBAR FUSION  02/ 2018    [facility] ([place], [place])  . SKIN CANCER EXCISION    . TONSILLECTOMY  child  . 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 TRANSTHORACIC ECHOCARDIOGRAM  01/21/2009   mild LVH, ef Q000111Q, grade 1 diastolic dysfunction  . ULNAR NERVE TRANSPOSITION Left 12-20-2008  dr sypher   decompression and partial resection medial triceps fascia  . WIDE LOCAL EXCISION PILONIDAL AREA  02-02-2002  [name]   recurrent squamous cell carcinoma in situ    There were no vitals filed for this  visit.  Subjective Assessment - 03/30/19 1529    Subjective  I haven't felt overly sore from the DN.  The back of the leg feels a lot looser.    Pertinent History  "Erin Roach";  left TKR;  lumbar fusion; restless legs, HTN; diabetes;  mild neuropathy;  osteopenia    Currently in Pain?  Yes    Pain Score  4     Pain Location  Back    Pain Orientation  Mid    Pain Type  Chronic pain    Multiple Pain Sites  Yes    Pain Score  3    Pain Location  Knee    Pain Orientation  Left                       [facility] Adult PT Treatment/Exercise - 03/30/19 0001      Lumbar Exercises: Stretches   Single Knee to Chest Stretch  Right;Left    Single Knee to Chest Stretch Limitations  per HEP     Double Knee to Chest Stretch  Limitations  per HEP     Lower Trunk Rotation  3 reps    Lower Trunk Rotation Limitations  per HEP       Lumbar Exercises: Aerobic   Nustep  L1 6 min while discussing progress     Other Aerobic Exercise  verbal review of previous HEP       Moist Heat Therapy   Number Minutes Moist Heat  5 Minutes    Moist Heat Location  Lumbar Spine      Manual Therapy   Manual therapy comments  piriformis contract relax right/left 3x 5 sec     Joint Mobilization  right/left inferior hip mobs and distraction grade 3 3x 20 sec    Soft tissue mobilization  bil lumbar paraspinals        Trigger Point Dry Needling - 03/30/19 0001    Consent Given?  Yes    Muscles Treated Back/Hip  Lumbar multifidi    Dry Needling Comments  bil     Electrical Stimulation Performed with Dry Needling  Yes    E-stim with Dry Needling Details  L2 8 min     Lumbar multifidi Response  Twitch response elicited;Palpable increased muscle length           PT Education - 03/30/19 1707    Education Details  Access Code: [email or web address]    Person(s) Educated  Patient    Methods  Explanation;Handout    Comprehension  Verbalized understanding       PT Short Term Goals - 03/27/19 1155      PT SHORT TERM GOAL #1   Title  independent with initial HEP for mobility and strengthening    Status  On-going        PT Long Term Goals - 03/09/19 2055      PT LONG TERM GOAL #1   Title  independent with advanced HEP    Time  8    Period  Weeks    Status  New    Target Date  05/04/19      PT LONG TERM GOAL #2   Title  The patient will be able to to walk puppy 8-12 min with 50% less pain    Time  8    Period  Weeks    Status  New      PT LONG TERM GOAL #3   Title  The patient will  have improved lumbo/pelvic/shoulder/periscapular core strength grossly 4+/5 needed for walking and standing longer periods of time    Time  8    Period  Weeks    Status  New      PT LONG TERM GOAL #4   Title  The patient will have improved bil  hip abduction/gluteal strength to 4/5 needed for standing >5 minutes    Time  8    Period  Weeks    Status  New      PT LONG TERM GOAL #5   Title  FOTO functional outcome score improved from 60% limitation to 51%    Time  8    Period  Weeks    Status  New      PT LONG TERM GOAL #6   Title  ...      PT LONG TERM GOAL #7   Title  ...            Plan - 03/30/19 1708    Clinical Impression Statement  Treatment focus on patient's primary complaint of central low back pain.  She is very receptive and optimistic on the benefit of DN thus far.   She has tender points and dense soft tissue along bilateral lumbar paraspinals with increased resistance noted in lumbar multifidi.   Therapist closely monitoring response with all interventions.  Following treatment session, much improved soft tissue and joint mobility noted.    Comorbidities  Lumbar fusion;  left TKR with poor result; HTN; diabetes; asthma; osteopenia; restless legs    Rehab Potential  Good    PT Frequency  2x / week    PT Duration  8 weeks    PT Treatment/Interventions  ADLs/Self Care Home Management;Therapeutic exercise;Therapeutic activities;Balance training;Neuromuscular re-education;Patient/family education;Dry needling;Taping;Moist Heat;Cryotherapy;Electrical Stimulation;Ultrasound;Manual techniques;Traction    PT Next Visit Plan  start KX;  assess response to ES/with DN to lumbar multifidi;   DN to Rt lateral hip, ITB, lateral quad, medial and lateral gastroc.  Check Rt posterior tib and soleus for DN, revisit sidelying core/clam, progress gentle core, LE stretching    PT Home Exercise Plan  Access Code: [email or web address]       Patient will benefit from skilled therapeutic intervention in order to improve the following deficits and impairments:  Difficulty walking, Increased fascial restricitons, Increased muscle spasms, Impaired UE functional use, Pain, Impaired perceived functional ability, Decreased activity tolerance, Impaired  flexibility, Decreased strength  Visit Diagnosis: Chronic bilateral low back pain without sciatica  Pain in right hip  Difficulty in walking, not elsewhere classified  Muscle weakness (generalized)     Problem List Patient Active Problem List   Diagnosis Date Noted  . Carotid arterial disease ([place]) 01/23/2019  . RBBB (right bundle branch block with left anterior fascicular block)   . Chest pain 01/22/2019  . Weakness of both lower extremities 01/02/2019  . Frequent falls 01/02/2019  . Cellulitis of right elbow 12/29/2018  . OSA on CPAP 06/15/2017  . Diabetes ([place]) 03/02/2017  . Herpes zoster without complication 123456  . Prediabetes 02/23/2017  . Shortness of breath on exertion 02/23/2017  . B12 nutritional deficiency 02/23/2017  . Vitamin D deficiency 12/08/2016  . Tubular adenoma of colon 04/29/2016  . Essential hypertension 09/12/2015  . Allergic rhinitis 07/17/2015  . Insomnia 07/17/2015  . Obese 04/10/2015  . S/P left TKA 04/09/2015  . Osteopenia 12/04/2012  . GERD (gastroesophageal reflux disease) 02/07/2012  . 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 RESTLESS LEG SYNDROME 02/11/2009  . LEG EDEMA,  BILATERAL 02/11/2009  . Hyperlipidemia 10/30/2008  . Urinary incontinence 10/30/2008  . Skin cancer 10/30/2008  . Asthma 11/21/2007  . Venous (peripheral) insufficiency 11/22/2006   Ruben Im, PT 03/30/19 5:19 PM Phone: [phone] Fax: [phone] Alvera Singh 03/30/2019, 5:19 PM  [facility] [address] [place], [place], [address] Phone: [phone]   Fax:  [phone]  Name: Erin Roach MRN: [id number] Date of Birth: 1940-08-10

## 2019-03-31 ENCOUNTER — Encounter: Payer: Self-pay | Admitting: Internal Medicine

## 2019-04-03 ENCOUNTER — Encounter: Payer: Self-pay | Admitting: Physical Therapy

## 2019-04-03 ENCOUNTER — Other Ambulatory Visit: Payer: Self-pay

## 2019-04-03 ENCOUNTER — Ambulatory Visit: Payer: Medicare Other | Admitting: Physical Therapy

## 2019-04-03 DIAGNOSIS — G8929 Other chronic pain: Secondary | ICD-10-CM

## 2019-04-03 DIAGNOSIS — M25551 Pain in right hip: Secondary | ICD-10-CM

## 2019-04-03 DIAGNOSIS — M6281 Muscle weakness (generalized): Secondary | ICD-10-CM

## 2019-04-03 DIAGNOSIS — M545 Low back pain, unspecified: Secondary | ICD-10-CM

## 2019-04-03 DIAGNOSIS — M62838 Other muscle spasm: Secondary | ICD-10-CM

## 2019-04-03 DIAGNOSIS — R262 Difficulty in walking, not elsewhere classified: Secondary | ICD-10-CM

## 2019-04-03 DIAGNOSIS — M25562 Pain in left knee: Secondary | ICD-10-CM

## 2019-04-03 NOTE — Telephone Encounter (Signed)
Last OV was 03/06/19 Next OV 09/04/19 Last RF 03/06/19

## 2019-04-03 NOTE — Therapy (Signed)
[facility] [address], [place], [place], [address] Phone: [phone]   Fax:  [phone]  Physical Therapy Treatment  Patient Details  Name: Erin Roach MRN: [id number] Date of Birth: November 19, 1940 Referring Provider (PT): Dr. Billey Gosling   Encounter Date: 04/03/2019  PT End of Session - 04/03/19 1113    Visit Number  5    Date for PT Re-Evaluation  05/04/19    Authorization Type  [employer] Medicare;  KX starting at visit 5 secondary to previous PT    PT Start Time  1103    PT Stop Time  1145    PT Time Calculation (min)  42 min    Activity Tolerance  Patient tolerated treatment well;No increased pain    Behavior During Therapy  WFL for tasks assessed/performed       Past Medical History:  Diagnosis Date  . Allergic rhinitis   . Apnea   . Arthritis   . Asthma    very mild  . Back pain   . Bilateral lower extremity edema   . Carotid artery disease ([place])    a. Carotid duplex 03/2016 - duplex was stable, 123456 RICA, 123456 LICA  . Complication of anesthesia    post-op delirium  . Endometrial polyp   . Essential hypertension   . GERD (gastroesophageal reflux disease)   . Heart murmur    per pt  . History of adenomatous polyp of colon    04-24-2016  tubular adenoma  . History of squamous cell carcinoma excision    pilonidal area second excision 02-02-2002  . HTN (hypertension)   . Hyperlipidemia   . Leg pain   . OAB (overactive bladder)   . RA (rheumatoid arthritis) ([place])   . RBBB (right bundle branch block with left anterior fascicular block)   . RLS (restless legs syndrome)   . Shortness of breath on exertion   . SUI (stress urinary incontinence, female)   . Venous insufficiency    legs  . Vitamin D deficiency     Past Surgical History:  Procedure Laterality Date  . APPENDECTOMY  07-02-2005   [name]   open  . CARDIAC CATHETERIZATION  01-23-2009  dr Darnell Level brodie   normal coronary  arteries and lvf  . CARDIOVASCULAR STRESS TEST  03/31/2016   Low risk nuclear study w/ medium defect of mild severity in basal anterior and mid anterior location w/ no evidence ischemia or infarction/  normal LV function and wall motion,  nuclear stress ef 71%  . CATARACT EXTRACTION W/ INTRAOCULAR LENS IMPLANT Left 09/2016  . COLONOSCOPY  last one 04-24-2016  . HYSTEROSCOPY W/D&C N/A 12/22/2016   Procedure: DILATATION AND CURETTAGE /HYSTEROSCOPY;  Surgeon: Dian Queen, MD;  Location: [facility];  Service: Gynecology;  Laterality: N/A;  . IR US GUIDANCE  09/08/2016  . POSTERIOR LUMBAR FUSION  02/ 2018    [facility] ([place], [place])  . SKIN CANCER EXCISION    . TONSILLECTOMY  child  . 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 TRANSTHORACIC ECHOCARDIOGRAM  01/21/2009   mild LVH, ef Q000111Q, grade 1 diastolic dysfunction  . ULNAR NERVE TRANSPOSITION Left 12-20-2008  dr sypher   decompression and partial resection medial triceps fascia  . WIDE LOCAL EXCISION PILONIDAL AREA  02-02-2002  [name]   recurrent squamous cell carcinoma in  situ    There were no vitals filed for this visit.  Subjective Assessment - 04/03/19 1109    Subjective  I went to [employer] after last visit and my back bothered me by the time I got home.  The HEP additions seemed to help.    Pertinent History  "Kealey";  left TKR;  lumbar fusion; restless legs, HTN; diabetes;  mild neuropathy;  osteopenia    How long can you stand comfortably?  2 minutes    How long can you walk comfortably?  5 min then hurts hips    Diagnostic tests  none    Patient Stated Goals  I would like to feel better, be more active; better quality of life    Currently in Pain?  Yes    Pain Score  3     Pain Location  Knee    Pain Orientation  Left    Pain Descriptors / Indicators   Aching;Tightness    Pain Type  Chronic pain    Pain Onset  More than a month ago    Pain Frequency  Intermittent    Aggravating Factors   stand/walk    Multiple Pain Sites  Yes    Pain Score  2    Pain Location  Back    Pain Orientation  Mid    Pain Descriptors / Indicators  Aching    Pain Type  Chronic pain    Pain Onset  More than a month ago    Pain Frequency  Intermittent    Aggravating Factors   walking/standing    Pain Relieving Factors  stretching                       [facility] Adult PT Treatment/Exercise - 04/03/19 0001      Exercises   Exercises  Lumbar;Knee/Hip      Lumbar Exercises: Stretches   Single Knee to Chest Stretch  Left;Right;3 reps;10 seconds    Lower Trunk Rotation  5 reps      Lumbar Exercises: Aerobic   Nustep  L3 x 9', Pt present to discuss symptoms and plan for session       Lumbar Exercises: Supine   Bridge  10 reps;2 seconds      Lumbar Exercises: Prone   Other Prone Lumbar Exercises  full core recruitment PF, TrA, knee bend hover for deep mulitif, 10x5 sec after DN, tactile cues by PT on Lt L3-L4      Manual Therapy   Manual Therapy  Manual Traction    Soft tissue mobilization  skin rolling along central lumbar scar    Manual Traction  prone sacral distraction after DN Gr III intermittent, gentle sacral rocking Gr II       Trigger Point Dry Needling - 04/03/19 0001    Consent Given?  Yes    Education Handout Provided  Previously provided    Muscles Treated Back/Hip  Lumbar multifidi    Dry Needling Comments  bil    Other Dry Needling  L3-L5    Lumbar multifidi Response  Twitch response elicited;Palpable increased muscle length   Rt>Lt, L5>L3-L4            PT Short Term Goals - 03/27/19 1155      PT SHORT TERM GOAL #1   Title  independent with initial HEP for mobility and strengthening    Status  On-going        PT Long Term Goals - 03/09/19  2055      PT LONG TERM GOAL #1   Title  independent with advanced  HEP    Time  8    Period  Weeks    Status  New    Target Date  05/04/19      PT LONG TERM GOAL #2   Title  The patient will be able to to walk puppy 8-12 min with 50% less pain    Time  8    Period  Weeks    Status  New      PT LONG TERM GOAL #3   Title  The patient will have improved lumbo/pelvic/shoulder/periscapular core strength grossly 4+/5 needed for walking and standing longer periods of time    Time  8    Period  Weeks    Status  New      PT LONG TERM GOAL #4   Title  The patient will have improved bil hip abduction/gluteal strength to 4/5 needed for standing >5 minutes    Time  8    Period  Weeks    Status  New      PT LONG TERM GOAL #5   Title  FOTO functional outcome score improved from 60% limitation to 51%    Time  8    Period  Weeks    Status  New      PT LONG TERM GOAL #6   Title  ...      PT LONG TERM GOAL #7   Title  ...            Plan - 04/03/19 1147    Clinical Impression Statement  Pt was unsure about response to lumbar DN last visit but wanted to try again.  PT performed DN to bil lumbar multifidi L3-S1 with greater thickness and tissue density noted Rt>Lt and L5>L3 and L4.  PT performed skin rolling along central lumbar scar due to poor tissue mobility there.  PT noted improved resting tissue density and mobility of L5/S1 and sacrum end of manual treatment.  Pt noted improved excursion of LE stretches added last visit in supine end of session today and reported less LBP end of session.  She continues to be compliant in HEP but PT noted we may modify as we go to incorporate more direct core strength/bulking for deep MF.  Atrophy noted on Lt L3/4, L4/5 but with improved recruitment after DN today in prone.  Pt will continue to benefit from skilled PT along POC with slower anticipated progress due to multiple chronic pain areas.    Comorbidities  Lumbar fusion;  left TKR with poor result; HTN; diabetes; asthma; osteopenia; restless legs    Rehab  Potential  Good    PT Frequency  2x / week    PT Duration  8 weeks    PT Treatment/Interventions  ADLs/Self Care Home Management;Therapeutic exercise;Therapeutic activities;Balance training;Neuromuscular re-education;Patient/family education;Dry needling;Taping;Moist Heat;Cryotherapy;Electrical Stimulation;Ultrasound;Manual techniques;Traction    PT Next Visit Plan  continue KX, f/u on DN to lumbar, continue deep core recruitment faciliation especially on Lt lumbar, progress lumbar and hip ROM, stabilization    PT Home Exercise Plan  Access Code: [email or web address]    Consulted and Agree with Plan of Care  Patient       Patient will benefit from skilled therapeutic intervention in order to improve the following deficits and impairments:     Visit Diagnosis: Chronic bilateral low back pain without sciatica  Pain in right hip  Difficulty in walking,  not elsewhere classified  Muscle weakness (generalized)  Other muscle spasm  Chronic pain of left knee     Problem List Patient Active Problem List   Diagnosis Date Noted  . Carotid arterial disease ([place]) 01/23/2019  . RBBB (right bundle branch block with left anterior fascicular block)   . Chest pain 01/22/2019  . Weakness of both lower extremities 01/02/2019  . Frequent falls 01/02/2019  . Cellulitis of right elbow 12/29/2018  . OSA on CPAP 06/15/2017  . Diabetes ([place]) 03/02/2017  . Herpes zoster without complication 123456  . Prediabetes 02/23/2017  . Shortness of breath on exertion 02/23/2017  . B12 nutritional deficiency 02/23/2017  . Vitamin D deficiency 12/08/2016  . Tubular adenoma of colon 04/29/2016  . Essential hypertension 09/12/2015  . Allergic rhinitis 07/17/2015  . Insomnia 07/17/2015  . Obese 04/10/2015  . S/P left TKA 04/09/2015  . Osteopenia 12/04/2012  . GERD (gastroesophageal reflux disease) 02/07/2012  . 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 RESTLESS LEG SYNDROME 02/11/2009  . LEG EDEMA, BILATERAL 02/11/2009  .  Hyperlipidemia 10/30/2008  . Urinary incontinence 10/30/2008  . Skin cancer 10/30/2008  . Asthma 11/21/2007  . Venous (peripheral) insufficiency 11/22/2006    Baruch Merl, PT 04/03/19 11:55 AM    [facility] [address], [place], [place], [address] Phone: [phone]   Fax:  [phone]  Name: KHRISTINE VEREB MRN: [id number] Date of Birth: Jan 28, 1941

## 2019-04-04 MED ORDER — HYDROCODONE-ACETAMINOPHEN 5-325 MG PO TABS: 1.0000 | ORAL_TABLET | Freq: Every day | ORAL | 0 refills | Status: DC

## 2019-04-05 ENCOUNTER — Encounter: Payer: Self-pay | Admitting: Physical Therapy

## 2019-04-05 ENCOUNTER — Ambulatory Visit: Payer: Medicare Other | Admitting: Physical Therapy

## 2019-04-05 ENCOUNTER — Other Ambulatory Visit: Payer: Self-pay

## 2019-04-05 DIAGNOSIS — G8929 Other chronic pain: Secondary | ICD-10-CM

## 2019-04-05 DIAGNOSIS — R262 Difficulty in walking, not elsewhere classified: Secondary | ICD-10-CM

## 2019-04-05 DIAGNOSIS — M62838 Other muscle spasm: Secondary | ICD-10-CM

## 2019-04-05 DIAGNOSIS — M6281 Muscle weakness (generalized): Secondary | ICD-10-CM

## 2019-04-05 DIAGNOSIS — M545 Low back pain, unspecified: Secondary | ICD-10-CM

## 2019-04-05 DIAGNOSIS — M25562 Pain in left knee: Secondary | ICD-10-CM

## 2019-04-05 NOTE — Therapy (Signed)
[facility] [address] [place], [place], [address] Phone: [phone]   Fax:  [phone]  Physical Therapy Treatment  Patient Details  Name: Erin Roach MRN: [id number] Date of Birth: [birth year] Referring Provider (PT): Dr. Billey Gosling   Encounter Date: 04/05/2019  PT End of Session - 04/05/19 1112    Visit Number  6    Date for PT Re-Evaluation  05/04/19    Authorization Type  [employer]HC Medicare;  KX starting at visit 5 secondary to previous PT    PT Start Time  1103    PT Stop Time  1145    PT Time Calculation (min)  42 min    Activity Tolerance  Patient tolerated treatment well;No increased pain    Behavior During Therapy  WFL for tasks assessed/performed       Past Medical History:  Diagnosis Date  . Allergic rhinitis   . Apnea   . Arthritis   . Asthma    very mild  . Back pain   . Bilateral lower extremity edema   . Carotid artery disease ([place])    a. Carotid duplex 03/2016 - duplex was stable, 123456 RICA, 123456 LICA  . Complication of anesthesia    post-op delirium  . Endometrial polyp   . Essential hypertension   . GERD (gastroesophageal reflux disease)   . Heart murmur    per pt  . History of adenomatous polyp of colon    04-24-2016  tubular adenoma  . History of squamous cell carcinoma excision    pilonidal area second excision 02-02-2002  . HTN (hypertension)   . Hyperlipidemia   . Leg pain   . OAB (overactive bladder)   . RA (rheumatoid arthritis) ([place])   . RBBB (right bundle branch block with left anterior fascicular block)   . RLS (restless legs syndrome)   . Shortness of breath on exertion   . SUI (stress urinary incontinence, female)   . Venous insufficiency    legs  . Vitamin D deficiency     Past Surgical History:  Procedure Laterality Date  . APPENDECTOMY  07-02-2005   [name]   open  . CARDIAC CATHETERIZATION  01-23-2009  dr Darnell Level brodie   normal coronary  arteries and lvf  . CARDIOVASCULAR STRESS TEST  03/31/2016   Low risk nuclear study w/ medium defect of mild severity in basal anterior and mid anterior location w/ no evidence ischemia or infarction/  normal LV function and wall motion,  nuclear stress ef 71%  . CATARACT EXTRACTION W/ INTRAOCULAR LENS IMPLANT Left 09/2016  . COLONOSCOPY  last one 04-24-2016  . HYSTEROSCOPY W/D&C N/A 12/22/2016   Procedure: DILATATION AND CURETTAGE /HYSTEROSCOPY;  Surgeon: Dian Queen, MD;  Location: [facility];  Service: Gynecology;  Laterality: N/A;  . IR US GUIDANCE  09/08/2016  . POSTERIOR LUMBAR FUSION  02/ 2018    [facility] ([place], [place])  . SKIN CANCER EXCISION    . TONSILLECTOMY  child  . 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 TRANSTHORACIC ECHOCARDIOGRAM  01/21/2009   mild LVH, ef Q000111Q, grade 1 diastolic dysfunction  . ULNAR NERVE TRANSPOSITION Left 12-20-2008  dr sypher   decompression and partial resection medial triceps fascia  . WIDE LOCAL EXCISION PILONIDAL AREA  02-02-2002  [name]   recurrent squamous cell carcinoma in  situ    There were no vitals filed for this visit.  Subjective Assessment - 04/05/19 1109    Subjective  Pain is about a 3/10 in Lt knee and low back.  No noticable long term improvements between appointments yet.  I do feel that my back feels better when I do my HEP.    Pertinent History  "Erin Roach";  left TKR;  lumbar fusion; restless legs, HTN; diabetes;  mild neuropathy;  osteopenia    Limitations  House hold activities    How long can you stand comfortably?  2 minutes    How long can you walk comfortably?  5 min then hurts hips    Diagnostic tests  none    Patient Stated Goals  I would like to feel better, be more active; better quality of life    Currently in Pain?  Yes    Pain Score  3     Pain  Location  Knee    Pain Orientation  Left    Pain Descriptors / Indicators  Aching;Tightness    Pain Type  Chronic pain    Pain Onset  More than a month ago    Pain Frequency  Intermittent    Pain Score  3    Pain Location  Back    Pain Orientation  Mid    Pain Descriptors / Indicators  Aching    Pain Type  Chronic pain    Pain Onset  More than a month ago    Pain Frequency  Intermittent    Aggravating Factors   walk/stand    Pain Relieving Factors  stretching, DN                       [facility] Adult PT Treatment/Exercise - 04/05/19 0001      Neuro Re-ed    Neuro Re-ed Details   PT VC PF and TrA for all supine stabilization exercises, PT cued breath and counting aloud to ensure no breath holding      Exercises   Exercises  Lumbar;Knee/Hip      Lumbar Exercises: Supine   Heel Slides  20 reps    Heel Slides Limitations  10 each leg, as leg extension hovered off table to create more challenge from heel slide    Bent Knee Raise  20 reps    Bridge  10 reps    Other Supine Lumbar Exercises  bent knee fall out x 20 (10 each)    Other Supine Lumbar Exercises  yellow tband bil shoulder extension to table with PT anchoring overhead to facil TrA x 10 reps      Knee/Hip Exercises: Aerobic   Nustep  seat 8, arms 10 L3 x 7', PT present to review progress and plan for session today      Knee/Hip Exercises: Seated   Long Arc Quad  AROM;Left;15 reps      Manual Therapy   Manual Therapy  Joint mobilization;Soft tissue mobilization;Passive ROM    Joint Mobilization  Lt patellar mobs all directions Gr III/IV    Soft tissue mobilization  Lt ITB, peri-patellar on Lt    Passive ROM  Lt knee flexion/extension in supine             PT Education - 04/05/19 1144    Education Details  Access Code: [email or web address]    Person(s) Educated  Patient    Methods  Explanation;Handout    Comprehension  Verbalized understanding;Returned demonstration  PT Short Term Goals - 03/27/19  1155      PT SHORT TERM GOAL #1   Title  independent with initial HEP for mobility and strengthening    Status  On-going        PT Long Term Goals - 03/09/19 2055      PT LONG TERM GOAL #1   Title  independent with advanced HEP    Time  8    Period  Weeks    Status  New    Target Date  05/04/19      PT LONG TERM GOAL #2   Title  The patient Erin be able to to walk puppy 8-12 min with 50% less pain    Time  8    Period  Weeks    Status  New      PT LONG TERM GOAL #3   Title  The patient Erin have improved lumbo/pelvic/shoulder/periscapular core strength grossly 4+/5 needed for walking and standing longer periods of time    Time  8    Period  Weeks    Status  New      PT LONG TERM GOAL #4   Title  The patient Erin have improved bil hip abduction/gluteal strength to 4/5 needed for standing >5 minutes    Time  8    Period  Weeks    Status  New      PT LONG TERM GOAL #5   Title  FOTO functional outcome score improved from 60% limitation to 51%    Time  8    Period  Weeks    Status  New      PT LONG TERM GOAL #6   Title  ...      PT LONG TERM GOAL #7   Title  ...            Plan - 04/05/19 1320    Clinical Impression Statement  Pt has had good response to DN but is unsure she is getting carry over benefits between visits.  PT focused on Lt knee today with manual techniques to improve lateral soft tissue mobility, patellar mobility, and ROM.  PT encouraged Pt to be more compliant with already existing HEP for knee including LAQ which were reviewed today.  PT progressed supine stabilization for lumbar region today with good performane and improving awareness of deep core muscles.  PT encouraged Pt to count aloud to improve breath control.  Pt Erin continue to benefit from skilled PT along POC with focus on multiple body areas on PT Rx.    Comorbidities  Lumbar fusion;  left TKR with poor result; HTN; diabetes; asthma; osteopenia; restless legs    Rehab Potential   Good    PT Frequency  2x / week    PT Duration  8 weeks    PT Treatment/Interventions  ADLs/Self Care Home Management;Therapeutic exercise;Therapeutic activities;Balance training;Neuromuscular re-education;Patient/family education;Dry needling;Taping;Moist Heat;Cryotherapy;Electrical Stimulation;Ultrasound;Manual techniques;Traction    PT Next Visit Plan  continue KX, revisit supine core and progress as able, f/u on manual treatment to Lt knee, lumbar and Rt hip DN, progress to dynamic LE/trunk stabilization exercise over next 1-2 weeks    PT Home Exercise Plan  Access Code: [email or web address]    Consulted and Agree with Plan of Care  Patient       Patient Erin benefit from skilled therapeutic intervention in order to improve the following deficits and impairments:     Visit Diagnosis: Chronic bilateral low back pain without  sciatica  Muscle weakness (generalized)  Chronic pain of left knee  Difficulty in walking, not elsewhere classified  Other muscle spasm     Problem List Patient Active Problem List   Diagnosis Date Noted  . Carotid arterial disease ([place]) 01/23/2019  . RBBB (right bundle branch block with left anterior fascicular block)   . Chest pain 01/22/2019  . Weakness of both lower extremities 01/02/2019  . Frequent falls 01/02/2019  . Cellulitis of right elbow 12/29/2018  . OSA on CPAP 06/15/2017  . Diabetes ([place]) 03/02/2017  . Herpes zoster without complication 123456  . Prediabetes 02/23/2017  . Shortness of breath on exertion 02/23/2017  . B12 nutritional deficiency 02/23/2017  . Vitamin D deficiency 12/08/2016  . Tubular adenoma of colon 04/29/2016  . Essential hypertension 09/12/2015  . Allergic rhinitis 07/17/2015  . Insomnia 07/17/2015  . Obese 04/10/2015  . S/P left TKA 04/09/2015  . Osteopenia 12/04/2012  . GERD (gastroesophageal reflux disease) 02/07/2012  . 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 RESTLESS LEG SYNDROME 02/11/2009  . LEG EDEMA, BILATERAL  02/11/2009  . Hyperlipidemia 10/30/2008  . Urinary incontinence 10/30/2008  . Skin cancer 10/30/2008  . Asthma 11/21/2007  . Venous (peripheral) insufficiency 11/22/2006   Baruch Merl, PT 04/05/19 1:26 PM   [facility] [address], [place], [place], [address] Phone: [phone]   Fax:  [phone]  Name: Erin Roach MRN: [id number] Date of Birth: Aug 06, 1940

## 2019-04-05 NOTE — Patient Instructions (Signed)
Access Code: [email or web address]  URL: [email or web address]  Date: 04/05/2019  Prepared by: Venetia Night Beuhring   Exercises  Supine Piriformis Stretch with Foot on Ground - 3 reps - 1 sets - 30 hold - 1x daily - 7x weekly  Hooklying Hamstring Stretch with Strap - 3 reps - 1 sets - 30 hold - 1x daily - 7x weekly  Seated Hamstring Stretch - 3 reps - 1 sets - 30 hold - 1x daily - 7x weekly  Quadriceps Stretch with Chair - 2 reps - 1 sets - 30 hold - 1x daily - 7x weekly  Sidelying Transversus Abdominis Bracing - 10 reps - 2 sets - 10 hold - 1x daily - 7x weekly  Clamshell - 10 reps - 2 sets - 1x daily - 7x weekly  Supine Lower Trunk Rotation - 5 reps - 1 sets - 15 hold - 1x daily - 7x weekly  Hooklying Single Knee to Chest - 5 reps - 1 sets - 15 hold - 1x daily - 7x weekly  Supine Double Knee to Chest - 5 reps - 1 sets - 15 hold - 1x daily - 7x weekly  Bent Knee Fallouts - 10 reps - 2 sets - 1x daily - 7x weekly  Supine March - 10 reps - 2 sets - 1x daily - 7x weekly  Supine Heel Slides - 10 reps - 2 sets - 1x daily - 7x weekly

## 2019-04-08 ENCOUNTER — Other Ambulatory Visit (INDEPENDENT_AMBULATORY_CARE_PROVIDER_SITE_OTHER): Payer: Self-pay | Admitting: Family Medicine

## 2019-04-10 ENCOUNTER — Other Ambulatory Visit (INDEPENDENT_AMBULATORY_CARE_PROVIDER_SITE_OTHER): Payer: Self-pay | Admitting: Internal Medicine

## 2019-04-11 ENCOUNTER — Other Ambulatory Visit: Payer: Self-pay

## 2019-04-11 ENCOUNTER — Ambulatory Visit: Payer: Medicare Other | Attending: Internal Medicine | Admitting: Physical Therapy

## 2019-04-11 ENCOUNTER — Encounter: Payer: Self-pay | Admitting: Physical Therapy

## 2019-04-11 DIAGNOSIS — M25552 Pain in left hip: Secondary | ICD-10-CM | POA: Diagnosis present

## 2019-04-11 DIAGNOSIS — M25562 Pain in left knee: Secondary | ICD-10-CM | POA: Diagnosis present

## 2019-04-11 DIAGNOSIS — M62838 Other muscle spasm: Secondary | ICD-10-CM | POA: Diagnosis present

## 2019-04-11 DIAGNOSIS — R262 Difficulty in walking, not elsewhere classified: Secondary | ICD-10-CM | POA: Diagnosis present

## 2019-04-11 DIAGNOSIS — G8929 Other chronic pain: Secondary | ICD-10-CM | POA: Diagnosis present

## 2019-04-11 DIAGNOSIS — M545 Low back pain, unspecified: Secondary | ICD-10-CM

## 2019-04-11 DIAGNOSIS — M25551 Pain in right hip: Secondary | ICD-10-CM | POA: Insufficient documentation

## 2019-04-11 DIAGNOSIS — M6281 Muscle weakness (generalized): Secondary | ICD-10-CM | POA: Diagnosis present

## 2019-04-11 NOTE — Therapy (Signed)
[facility] [address], [place], [place], [address] Phone: [phone]   Fax:  [phone]  Physical Therapy Treatment  Patient Details  Name: Erin Roach MRN: [id number] Date of Birth: [birth year] Referring Provider (PT): Dr. Billey Gosling   Encounter Date: 04/11/2019  PT End of Session - 04/11/19 1409    Visit Number  7    Date for PT Re-Evaluation  05/04/19    Authorization Type  [employer] Medicare;  KX starting at visit 5 secondary to previous PT    PT Start Time  1149    PT Stop Time  1235    PT Time Calculation (min)  46 min    Activity Tolerance  Patient tolerated treatment well;No increased pain       Past Medical History:  Diagnosis Date  . Allergic rhinitis   . Apnea   . Arthritis   . Asthma    very mild  . Back pain   . Bilateral lower extremity edema   . Carotid artery disease ([place])    a. Carotid duplex 03/2016 - duplex was stable, 123456 RICA, 123456 LICA  . Complication of anesthesia    post-op delirium  . Endometrial polyp   . Essential hypertension   . GERD (gastroesophageal reflux disease)   . Heart murmur    per pt  . History of adenomatous polyp of colon    04-24-2016  tubular adenoma  . History of squamous cell carcinoma excision    pilonidal area second excision 02-02-2002  . HTN (hypertension)   . Hyperlipidemia   . Leg pain   . OAB (overactive bladder)   . RA (rheumatoid arthritis) ([place])   . RBBB (right bundle branch block with left anterior fascicular block)   . RLS (restless legs syndrome)   . Shortness of breath on exertion   . SUI (stress urinary incontinence, female)   . Venous insufficiency    legs  . Vitamin D deficiency     Past Surgical History:  Procedure Laterality Date  . APPENDECTOMY  07-02-2005   [name]   open  . CARDIAC CATHETERIZATION  01-23-2009  dr Darnell Level brodie   normal coronary arteries and lvf  . CARDIOVASCULAR STRESS TEST  03/31/2016   Low  risk nuclear study w/ medium defect of mild severity in basal anterior and mid anterior location w/ no evidence ischemia or infarction/  normal LV function and wall motion,  nuclear stress ef 71%  . CATARACT EXTRACTION W/ INTRAOCULAR LENS IMPLANT Left 09/2016  . COLONOSCOPY  last one 04-24-2016  . HYSTEROSCOPY W/D&C N/A 12/22/2016   Procedure: DILATATION AND CURETTAGE /HYSTEROSCOPY;  Surgeon: Dian Queen, MD;  Location: [facility];  Service: Gynecology;  Laterality: N/A;  . IR US GUIDANCE  09/08/2016  . POSTERIOR LUMBAR FUSION  02/ 2018    [facility] ([place], [place])  . SKIN CANCER EXCISION    . TONSILLECTOMY  child  . 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 TRANSTHORACIC ECHOCARDIOGRAM  01/21/2009   mild LVH, ef Q000111Q, grade 1 diastolic dysfunction  . ULNAR NERVE TRANSPOSITION Left 12-20-2008  dr sypher   decompression and partial resection medial triceps fascia  . WIDE LOCAL EXCISION PILONIDAL AREA  02-02-2002  [name]   recurrent squamous cell carcinoma in situ    There were no vitals filed for this  visit.  Subjective Assessment - 04/11/19 1150    Subjective  I'm the same.  Going OK.  It isn't perfect but it's livable.  The back of my right leg gets tight.  My home massager helps.  More of back of the thigh and lower leg.  I do a lot of the abdominal draw in, bridging and sit to stands.    Pertinent History  "[place]";  left TKR;  lumbar fusion; restless legs, HTN; diabetes;  mild neuropathy;  osteopenia    Patient Stated Goals  I would like to feel better, be more active; better quality of life    Currently in Pain?  Yes    Pain Score  1     Pain Location  Leg    Pain Orientation  Right                       [facility] Adult PT Treatment/Exercise - 04/11/19 0001      Self-Care   Other Self-Care Comments    neuroscience of pain education discussed setting attainable functional goals (walking the dog, walking distance) rather than striving for perfection or abolishment of pain       Knee/Hip Exercises: Stretches   Active Hamstring Stretch Limitations  review of HEP for HS stretch to optimize benefit of DN     Gastroc Stretch Limitations  review of HEP to optimize benefit of DN       Moist Heat Therapy   Number Minutes Moist Heat  5 Minutes    Moist Heat Location  Hip;Knee;Ankle      Manual Therapy   Soft tissue mobilization  right lateral HS, ITB, lateral quads, gastroc     Passive ROM  neural glide in supine right knee extension with and without ankle dorsiflexion 15x       Trigger Point Dry Needling - 04/11/19 0001    Consent Given?  Yes    Other Dry Needling  right side only     Vastus lateralis Response  Palpable increased muscle length    Hamstring Response  Palpable increased muscle length    Gastrocnemius Response  Palpable increased muscle length             PT Short Term Goals - 04/11/19 1414      PT SHORT TERM GOAL #1   Title  independent with initial HEP for mobility and strengthening    Status  Achieved      PT SHORT TERM GOAL #2   Title  The patient will have improved lumbar flexion to 60 degrees and HS muscle lengths to 70 degrees needed for improved household and community mobility    Status  Achieved      PT SHORT TERM GOAL #3   Title  The patient will be able to stand for 3-4 minutes for washing dishes at the sink    Time  4    Period  Weeks    Status  On-going      PT SHORT TERM GOAL #4   Title  The patient will report back, hip, knee and right upper arm pain about 30% improved with daily activities    Time  4    Period  Weeks    Status  On-going        PT Long Term Goals - 03/09/19 2055      PT LONG TERM GOAL #1   Title  independent with advanced HEP    Time  8  Period  Weeks    Status  New    Target Date  05/04/19      PT LONG TERM  GOAL #2   Title  The patient will be able to to walk puppy 8-12 min with 50% less pain    Time  8    Period  Weeks    Status  New      PT LONG TERM GOAL #3   Title  The patient will have improved lumbo/pelvic/shoulder/periscapular core strength grossly 4+/5 needed for walking and standing longer periods of time    Time  8    Period  Weeks    Status  New      PT LONG TERM GOAL #4   Title  The patient will have improved bil hip abduction/gluteal strength to 4/5 needed for standing >5 minutes    Time  8    Period  Weeks    Status  New      PT LONG TERM GOAL #5   Title  FOTO functional outcome score improved from 60% limitation to 51%    Time  8    Period  Weeks    Status  New      PT LONG TERM GOAL #6   Title  ...      PT LONG TERM GOAL #7   Title  ...            Plan - 04/11/19 1409    Clinical Impression Statement  The patient's main complaint is right posterior/lateral thigh tightness and calf tightness more than low back pain.  She is interested in trying DN for this region today.  Multiple tender points indentified in lateral HS and lateral quads in particular.  Following DN and manual therapy much improved soft tissue length.  Encouraged frequent compliance with HEP for best long term outcomes.  Neuroscience of pain education to encourage functional, attainable goal setting.  Therapist closely monitoring response and understanding of all treatment interventions.    Comorbidities  Lumbar fusion;  left TKR with poor result; HTN; diabetes; asthma; osteopenia; restless legs    Rehab Potential  Good    PT Frequency  2x / week    PT Duration  8 weeks    PT Treatment/Interventions  ADLs/Self Care Home Management;Therapeutic exercise;Therapeutic activities;Balance training;Neuromuscular re-education;Patient/family education;Dry needling;Taping;Moist Heat;Cryotherapy;Electrical Stimulation;Ultrasound;Manual techniques;Traction    PT Next Visit Plan  continue KX, assess response  to DN of right LE;  assess remaining STGS;  revisit supine core and progress as able, progress to dynamic LE/trunk stabilization exercise over next 1-2 weeks    PT Home Exercise Plan  Access Code: [email or web address]       Patient will benefit from skilled therapeutic intervention in order to improve the following deficits and impairments:  Difficulty walking, Increased fascial restricitons, Increased muscle spasms, Impaired UE functional use, Pain, Impaired perceived functional ability, Decreased activity tolerance, Impaired flexibility, Decreased strength  Visit Diagnosis: Chronic bilateral low back pain without sciatica  Muscle weakness (generalized)     Problem List Patient Active Problem List   Diagnosis Date Noted  . Carotid arterial disease ([place]) 01/23/2019  . RBBB (right bundle branch block with left anterior fascicular block)   . Chest pain 01/22/2019  . Weakness of both lower extremities 01/02/2019  . Frequent falls 01/02/2019  . Cellulitis of right elbow 12/29/2018  . OSA on CPAP 06/15/2017  . Diabetes ([place]) 03/02/2017  . Herpes zoster without complication 123456  . Prediabetes 02/23/2017  .  Shortness of breath on exertion 02/23/2017  . B12 nutritional deficiency 02/23/2017  . Vitamin D deficiency 12/08/2016  . Tubular adenoma of colon 04/29/2016  . Essential hypertension 09/12/2015  . Allergic rhinitis 07/17/2015  . Insomnia 07/17/2015  . Obese 04/10/2015  . S/P left TKA 04/09/2015  . Osteopenia 12/04/2012  . GERD (gastroesophageal reflux disease) 02/07/2012  . 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 RESTLESS LEG SYNDROME 02/11/2009  . LEG EDEMA, BILATERAL 02/11/2009  . Hyperlipidemia 10/30/2008  . Urinary incontinence 10/30/2008  . Skin cancer 10/30/2008  . Asthma 11/21/2007  . Venous (peripheral) insufficiency 11/22/2006   Ruben Im, PT 04/11/19 2:16 PM Phone: [phone] Fax: [phone] Alvera Singh 04/11/2019, 2:15 PM  [facility] [address] [place], [place], [address] Phone: [phone]   Fax:  [phone]  Name: SHANEKA JANET MRN: [id number] Date of Birth: February 20, 1941

## 2019-04-13 ENCOUNTER — Other Ambulatory Visit: Payer: Self-pay

## 2019-04-13 ENCOUNTER — Ambulatory Visit: Payer: Medicare Other | Admitting: Cardiovascular Disease

## 2019-04-13 ENCOUNTER — Encounter: Payer: Self-pay | Admitting: Cardiovascular Disease

## 2019-04-13 VITALS — BP 151/84 | HR 95 | Temp 96.7°F | Ht 66.0 in | Wt 201.0 lb

## 2019-04-13 DIAGNOSIS — I739 Peripheral vascular disease, unspecified: Secondary | ICD-10-CM | POA: Diagnosis not present

## 2019-04-13 DIAGNOSIS — R809 Proteinuria, unspecified: Secondary | ICD-10-CM

## 2019-04-13 DIAGNOSIS — Z9989 Dependence on other enabling machines and devices: Secondary | ICD-10-CM

## 2019-04-13 DIAGNOSIS — G4733 Obstructive sleep apnea (adult) (pediatric): Secondary | ICD-10-CM

## 2019-04-13 DIAGNOSIS — I10 Essential (primary) hypertension: Secondary | ICD-10-CM | POA: Diagnosis not present

## 2019-04-13 DIAGNOSIS — E1129 Type 2 diabetes mellitus with other diabetic kidney complication: Secondary | ICD-10-CM | POA: Diagnosis not present

## 2019-04-13 DIAGNOSIS — E782 Mixed hyperlipidemia: Secondary | ICD-10-CM

## 2019-04-13 NOTE — Patient Instructions (Signed)
Medication Instructions:  No changes *If you need a refill on your cardiac medications before your next appointment, please call your pharmacy*  Lab Work: None ordered If you have labs (blood work) drawn today and your tests are completely normal, you will receive your results only by: [name] MyChart Message (if you have MyChart) OR . A paper copy in the mail If you have any lab test that is abnormal or we need to change your treatment, we will call you to review the results.  Testing/Procedures: Your physician has requested that you have an ankle brachial index (ABI). During this test an ultrasound and blood pressure cuff are used to evaluate the arteries that supply the arms and legs with blood. Allow thirty minutes for this exam. There are no restrictions or special instructions. This will take place at [place], [address].   Follow-Up: At [facility], you and your health needs are our priority.  As part of our continuing mission to provide you with exceptional heart care, we have created designated Provider Care Teams.  These Care Teams include your primary Cardiologist (physician) and Advanced Practice Providers (APPs -  Physician Assistants and Nurse Practitioners) who all work together to provide you with the care you need, when you need it.  Your next appointment:   12 month(s)  The format for your next appointment:   In Person  Provider:   You may see Dr. Sallyanne Kuster or one of the following Advanced Practice Providers on your designated Care Team:    Almyra Deforest, PA-C  Fabian Sharp, PA-C or   Roby Lofts, [place]

## 2019-04-13 NOTE — Progress Notes (Signed)
Cardiology Office Note:    Date:  04/16/2019   ID:  Erin Roach, DOB [birth year], MRN [id number]  PCP:  Binnie Rail, MD  Cardiologist:  No primary care provider on file.  Electrophysiologist:  None   Referring MD: Binnie Rail, MD   Chief Complaint  Patient presents with  . PAD    HTN, hyperlipidemia, DM  . Sleep Apnea    History of Present Illness:    Erin Roach is a 78 y.o. female with a hx of PAD (moderate bilateral carotid stenosis), diabetes mellitus type 2, hypertension, hyperlipidemia, obesity, obstructive sleep apnea, chronic lower extremity edema returns in follow-up.  She was briefly hospitalized in September with chest discomfort.  Her chest discomfort was sharp and was not associated with physical activity.  Her cardiac enzymes and ECG showed low risk findings.  She has a history of a "normal" cardiac catheterization in 2010 and a low risk nuclear stress test in 2019 (basal mid anterior attenuation artifact, no reversible ischemia, EF 71%).  She has not had any recurrent episodes of chest pain since then.  She takes omeprazole "only as needed".  She has a lot of problems with severe restless leg syndromes, although she uses CPAP for sleep apnea.  She is on multiple medications including gabapentin, amitriptyline, pramipexole, Lyrica.  She only scored 7 points on the Epworth Sleepiness Scale.  She has mild intermittent lower extremity edema on a very low-dose of loop diuretic.    Glycemic control is exceptional on Victoza and Metformin with a most recent hemoglobin A1c of 5.4% in August 2020.  His recent LDL cholesterol was 91, but her triglycerides were modestly elevated at 279 (January 23, 2019).  She complains of cold feet.  She has never had ABI check.  She has a history of a knee replacement in 2016 and back surgery in 2018.  Some limitations due to musculoskeletal pain.  She exercises regularly (physical therapy type exercises) 15 minutes a day 6 days a  week and also walks.  Past Medical History:  Diagnosis Date  . Allergic rhinitis   . Apnea   . Arthritis   . Asthma    very mild  . Back pain   . Bilateral lower extremity edema   . Carotid artery disease ([place])    a. Carotid duplex 03/2016 - duplex was stable, 54-65% RICA, 0-35% LICA  . Complication of anesthesia    post-op delirium  . Endometrial polyp   . Essential hypertension   . GERD (gastroesophageal reflux disease)   . Heart murmur    per pt  . History of adenomatous polyp of colon    04-24-2016  tubular adenoma  . History of squamous cell carcinoma excision    pilonidal area second excision 02-02-2002  . HTN (hypertension)   . Hyperlipidemia   . Leg pain   . OAB (overactive bladder)   . RA (rheumatoid arthritis) ([place])   . RBBB (right bundle branch block with left anterior fascicular block)   . RLS (restless legs syndrome)   . Shortness of breath on exertion   . SUI (stress urinary incontinence, female)   . Venous insufficiency    legs  . Vitamin D deficiency     Past Surgical History:  Procedure Laterality Date  . APPENDECTOMY  07-02-2005   [name]   open  . CARDIAC CATHETERIZATION  01-23-2009  dr Darnell Level brodie   normal coronary arteries and lvf  . CARDIOVASCULAR STRESS TEST  03/31/2016  Low risk nuclear study w/ medium defect of mild severity in basal anterior and mid anterior location w/ no evidence ischemia or infarction/  normal LV function and wall motion,  nuclear stress ef 71%  . CATARACT EXTRACTION W/ INTRAOCULAR LENS IMPLANT Left 09/2016  . COLONOSCOPY  last one 04-24-2016  . HYSTEROSCOPY W/D&C N/A 12/22/2016   Procedure: DILATATION AND CURETTAGE /HYSTEROSCOPY;  Surgeon: Dian Queen, MD;  Location: [facility];  Service: Gynecology;  Laterality: N/A;  . IR US GUIDANCE  09/08/2016  . POSTERIOR LUMBAR FUSION  02/ 2018    [facility] ([place], [place])  . SKIN CANCER EXCISION    . TONSILLECTOMY  child  . TOTAL KNEE  ARTHROPLASTY Left 04/09/2015   Procedure: LEFT TOTAL KNEE ARTHROPLASTY;  Surgeon: Paralee Cancel, MD;  Location: [facility];  Service: Orthopedics;  Laterality: Left;  . TRANSTHORACIC ECHOCARDIOGRAM  01/21/2009   mild LVH, ef 54-49%, grade 1 diastolic dysfunction  . ULNAR NERVE TRANSPOSITION Left 12-20-2008  dr sypher   decompression and partial resection medial triceps fascia  . WIDE LOCAL EXCISION PILONIDAL AREA  02-02-2002  [name]   recurrent squamous cell carcinoma in situ    Current Medications: Current Meds  Medication Sig  . amitriptyline (ELAVIL) 50 MG tablet Take 2 tablets (100 mg total) by mouth at bedtime. (Patient taking differently: Take 50 mg by mouth at bedtime. )  . Blood Glucose Monitoring Suppl (ONE TOUCH ULTRA 2) w/Device KIT USE TO TEST BLOOD SUGARS UP TO 4 TIMES DAILY  . ergocalciferol (VITAMIN D2) 1.25 MG (50000 UT) capsule Take 50,000 Units by mouth once a week. Take one tablet by mouth on Mondays  . fluticasone (FLONASE) 50 MCG/ACT nasal spray Place 1 spray into both nostrils daily. (Patient taking differently: Place 1 spray into both nostrils daily as needed for allergies. )  . furosemide (LASIX) 20 MG tablet TAKE ONE TABLET BY MOUTH DAILY (Patient taking differently: Take 40 mg by mouth daily. )  . glucose blood (ACCU-CHEK AVIVA) test strip Testing twice daily  . HORIZANT 600 MG TBCR Take 1 tablet by mouth at bedtime.   [name] HYDROcodone-acetaminophen (NORCO/VICODIN) 5-325 MG tablet Take 1 tablet by mouth at bedtime.  . Insulin Pen Needle (BD PEN NEEDLE NANO 2ND GEN) 32G X 4 MM MISC 1 Package by Does not apply route 2 (two) times daily.  [name] liraglutide (VICTOZA) 18 MG/3ML SOPN DIAL AND INJECT SUBCUTANEOUSLY 1.8MG DAILY EVERY MORNING  . losartan (COZAAR) 100 MG tablet Take 1 tablet (100 mg total) by mouth daily.  . metFORMIN (GLUCOPHAGE) 500 MG tablet TAKE 1 TABLET BY MOUTH TWICE A DAY WITH A MEAL.  [name] omeprazole (PRILOSEC) 10 MG capsule Take 1 capsule (10 mg total) by  mouth daily.  . pramipexole (MIRAPEX) 0.25 MG tablet Take 0.25 mg by mouth daily. 1630  And  Bedtime   . pregabalin (LYRICA) 75 MG capsule TAKE ONE CAPSULE BY MOUTH EVERY AFTERNOON AND TAKE TWO CAPSULES BY MOUTH EVERY NIGHT AT BEDTIME (Patient taking differently: Take 75 mg by mouth every evening. )  . simvastatin (ZOCOR) 40 MG tablet TAKE ONE TABLET BY MOUTH AT BEDTIME *OFFICE VISIT NEEDED FOR FURTHER REFILLS* (Patient taking differently: Take 40 mg by mouth at bedtime. )     Allergies:   Patient has no known allergies.   Social History   Socioeconomic History  . Marital status: Married    Spouse name: Erin Roach  . Number of children: 2  . Years of education: Not on  file  . Highest education level: Not on file  Occupational History  . Occupation: retired Tour manager  . Financial resource strain: Not hard at all  . Food insecurity    Worry: Never true    Inability: Never true  . Transportation needs    Medical: No    Non-medical: No  Tobacco Use  . Smoking status: Former Smoker    Packs/day: 0.50    Years: 10.00    Pack years: 5.00    Types: Cigarettes    Quit date: 12/10/1973    Years since quitting: 45.3  . Smokeless tobacco: Never Used  Substance and Sexual Activity  . Alcohol use: Yes    Comment: occasional  . Drug use: No  . Sexual activity: Not on file  Lifestyle  . Physical activity    Days per week: 3 days    Minutes per session: 40 min  . Stress: Only a little  Relationships  . Social connections    Talks on phone: More than three times a week    Gets together: More than three times a week    Attends religious service: Not on file    Active member of club or organization: Not on file    Attends meetings of clubs or organizations: Not on file    Relationship status: Married  Other Topics Concern  . Not on file  Social History Narrative   2 children ages 54,35     Family History: The patient's family history includes CAD in her sister; COPD in her  maternal grandfather; Cancer in her father; Diabetes in her maternal grandmother and another family member; Esophageal cancer in her father; Heart attack in her paternal grandmother; Heart attack (age of onset: 61) in her maternal grandfather; Heart disease in her father; Heart failure in her father; Hyperlipidemia in her father and mother; Hypertension in her father and mother; Stroke (age of onset: 63) in her maternal grandmother.  ROS:   Please see the history of present illness.     All other systems reviewed and are negative.  EKGs/Labs/Other Studies Reviewed:    The following studies were reviewed today: Carotid duplex ultrasound 01/23/2019: Summary: Right Carotid: Velocities in the right ICA are consistent with a 40-59% stenosis. No significant change from pervious study.  Left Carotid: Velocities in the left ICA are consistent with a 1-39% stenosis. No significant change from previous study.  Vertebrals:  Bilateral vertebral arteries demonstrate antegrade flow. Subclavians: Normal flow hemodynamics were seen in bilateral subclavian arteries.  ECHO 01/23/2019:  1. The left ventricle has hyperdynamic systolic function, with an ejection fraction of >65%. The cavity size was normal. There is mildly increased left ventricular wall thickness. Left ventricular diastolic Doppler parameters are consistent with  impaired relaxation. No evidence of left ventricular regional wall motion abnormalities.  2. The right ventricle has normal systolic function. The cavity was normal. There is no increase in right ventricular wall thickness.  3. Left atrial size was mildly dilated.  4. Trivial pericardial effusion is present.  5. There is mild to moderate mitral annular calcification present. No evidence of mitral valve stenosis. Trivial mitral regurgitation.  6. The aortic valve is tricuspid. Mild calcification of the aortic valve. No stenosis of the aortic valve.  7. The aortic root is normal in  size and structure.  8. The IVC is normal in size. No complete TR doppler jet so unable to estimate PA systolic pressure.  EKG:  EKG is not ordered  today.  The ekg ordered 01/22/2019 demonstrates sinus rhythm, right bundle branch block, left anterior fascicular block, possible right atrial enlargement  Recent Labs: 12/13/2018: TSH 5.130 01/22/2019: ALT 36 01/23/2019: BUN 16; Creatinine, Ser 0.86; Hemoglobin 11.0; Platelets 250; Potassium 4.0; Sodium 140  Recent Lipid Panel    Component Value Date/Time   CHOL 192 01/23/2019 0718   CHOL 190 12/13/2018 0935   TRIG 279 (H) 01/23/2019 0718   TRIG 101 05/19/2006 0832   HDL 45 01/23/2019 0718   HDL 54 12/13/2018 0935   CHOLHDL 4.3 01/23/2019 0718   VLDL 56 (H) 01/23/2019 0718   LDLCALC 91 01/23/2019 0718   LDLCALC 104 (H) 12/13/2018 0935   LDLDIRECT 141.3 09/06/2012 0907    Physical Exam:    VS:  BP (!) 151/84   Pulse 95   Temp (!) 96.7 F (35.9 C)   Ht _0  (1.676 m)   Wt 201 lb (91.2 kg)   SpO2 100%   BMI 32.44 kg/m     Wt Readings from Last 3 Encounters:  04/13/19 201 lb (91.2 kg)  03/06/19 201 lb (91.2 kg)  01/22/19 202 lb 12.8 oz (92 kg)     GEN: Mildly obese well nourished, well developed in no acute distress HEENT: Normal NECK: No JVD; No carotid bruits LYMPHATICS: No lymphadenopathy CARDIAC: Widely split S2, RRR, no murmurs, rubs, gallops RESPIRATORY:  Clear to auscultation without rales, wheezing or rhonchi  ABDOMEN: Soft, non-tender, non-distended MUSCULOSKELETAL:  No edema; No deformity  SKIN: Warm and dry NEUROLOGIC:  Alert and oriented x 3 PSYCHIATRIC:  Normal affect   ASSESSMENT:    1. PAD (peripheral artery disease) ([place])   2. Mixed hyperlipidemia   3. Type 2 diabetes mellitus with microalbuminuria, without long-term current use of insulin ([place])   4. Essential hypertension   5. OSA on CPAP    PLAN:    In order of problems listed above:  1. PAD: No progression in carotid stenoses compared to  2017.  No symptoms of TIA/CVA.  Check lower extremity ankle-brachial indices. 2. HLP: Ideally would treat for LDL cholesterol less than 70.  There is room to switch to a more potent statin such as atorvastatin or rosuvastatin.  Try lifestyle changes first.  Persistent hypertriglyceridemia despite excellent glycemic control suggests insulin resistance/metabolic syndrome.  Weight loss would be highly beneficial.  He is enrolled in the healthy weight and wellness program with Dr. Leafy Ro, on a 1500-calorie / 85 g protein daily diet. 3. DM: Excellent control. 4. HTN: Blood pressure is high today, but she reports much better control at home.  Target 130/80 or less.  ARB is an excellent choice for renal protection. 5. OSA: No symptoms of daytime hypersomnolence while using CPAP.  Still has a lot of issues with restless leg syndrome.   Medication Adjustments/Labs and Tests Ordered: Current medicines are reviewed at length with the patient today.  Concerns regarding medicines are outlined above.  No orders of the defined types were placed in this encounter.  No orders of the defined types were placed in this encounter.   Patient Instructions  Medication Instructions:  No changes *If you need a refill on your cardiac medications before your next appointment, please call your pharmacy*  Lab Work: None ordered If you have labs (blood work) drawn today and your tests are completely normal, you will receive your results only by: [name] MyChart Message (if you have MyChart) OR . A paper copy in the mail If you have any  lab test that is abnormal or we need to change your treatment, we will call you to review the results.  Testing/Procedures: Your physician has requested that you have an ankle brachial index (ABI). During this test an ultrasound and blood pressure cuff are used to evaluate the arteries that supply the arms and legs with blood. Allow thirty minutes for this exam. There are no restrictions or  special instructions. This will take place at [place], [address].   Follow-Up: At [facility], you and your health needs are our priority.  As part of our continuing mission to provide you with exceptional heart care, we have created designated Provider Care Teams.  These Care Teams include your primary Cardiologist (physician) and Advanced Practice Providers (APPs -  Physician Assistants and Nurse Practitioners) who all work together to provide you with the care you need, when you need it.  Your next appointment:   12 month(s)  The format for your next appointment:   In Person  Provider:   You may see Dr. Sallyanne Kuster or one of the following Advanced Practice Providers on your designated Care Team:    Almyra Deforest, PA-C  Fabian Sharp, [place] or   Roby Lofts, PA-C       Signed, Sanda Klein, MD  04/16/2019 10:18 AM    [place]

## 2019-04-14 ENCOUNTER — Ambulatory Visit: Payer: Medicare Other | Admitting: Physical Therapy

## 2019-04-14 ENCOUNTER — Other Ambulatory Visit (HOSPITAL_COMMUNITY): Payer: Self-pay | Admitting: Cardiovascular Disease

## 2019-04-14 ENCOUNTER — Other Ambulatory Visit: Payer: Self-pay | Admitting: Cardiovascular Disease

## 2019-04-14 DIAGNOSIS — I739 Peripheral vascular disease, unspecified: Secondary | ICD-10-CM

## 2019-04-16 ENCOUNTER — Encounter: Payer: Self-pay | Admitting: Cardiovascular Disease

## 2019-04-17 ENCOUNTER — Ambulatory Visit: Payer: Medicare Other | Admitting: Physical Therapy

## 2019-04-17 ENCOUNTER — Encounter: Payer: Self-pay | Admitting: Physical Therapy

## 2019-04-17 ENCOUNTER — Other Ambulatory Visit: Payer: Self-pay

## 2019-04-17 DIAGNOSIS — M545 Low back pain, unspecified: Secondary | ICD-10-CM

## 2019-04-17 DIAGNOSIS — G8929 Other chronic pain: Secondary | ICD-10-CM

## 2019-04-17 DIAGNOSIS — M25551 Pain in right hip: Secondary | ICD-10-CM

## 2019-04-17 DIAGNOSIS — M62838 Other muscle spasm: Secondary | ICD-10-CM

## 2019-04-17 DIAGNOSIS — M6281 Muscle weakness (generalized): Secondary | ICD-10-CM

## 2019-04-17 DIAGNOSIS — M25562 Pain in left knee: Secondary | ICD-10-CM

## 2019-04-17 DIAGNOSIS — R262 Difficulty in walking, not elsewhere classified: Secondary | ICD-10-CM

## 2019-04-17 NOTE — Therapy (Signed)
[facility] [address] [place], [place], [address] Phone: [phone]   Fax:  [phone]  Physical Therapy Treatment  Patient Details  Name: Erin Roach MRN: [id number] Date of Birth: 24-Aug-1940 Referring Provider (PT): Dr. Billey Gosling   Encounter Date: 04/17/2019  PT End of Session - 04/17/19 1153    Visit Number  8    Date for PT Re-Evaluation  05/04/19    Authorization Type  [employer] Medicare;  KX starting at visit 5 secondary to previous PT    PT Start Time  1100    PT Stop Time  1146    PT Time Calculation (min)  46 min    Activity Tolerance  Patient tolerated treatment well;No increased pain    Behavior During Therapy  WFL for tasks assessed/performed       Past Medical History:  Diagnosis Date  . Allergic rhinitis   . Apnea   . Arthritis   . Asthma    very mild  . Back pain   . Bilateral lower extremity edema   . Carotid artery disease ([place])    a. Carotid duplex 03/2016 - duplex was stable, 32-95% RICA, 1-88% LICA  . Complication of anesthesia    post-op delirium  . Endometrial polyp   . Essential hypertension   . GERD (gastroesophageal reflux disease)   . Heart murmur    per pt  . History of adenomatous polyp of colon    04-24-2016  tubular adenoma  . History of squamous cell carcinoma excision    pilonidal area second excision 02-02-2002  . HTN (hypertension)   . Hyperlipidemia   . Leg pain   . OAB (overactive bladder)   . RA (rheumatoid arthritis) ([place])   . RBBB (right bundle branch block with left anterior fascicular block)   . RLS (restless legs syndrome)   . Shortness of breath on exertion   . SUI (stress urinary incontinence, female)   . Venous insufficiency    legs  . Vitamin D deficiency     Past Surgical History:  Procedure Laterality Date  . APPENDECTOMY  07-02-2005   [name]   open  . CARDIAC CATHETERIZATION  01-23-2009  dr Darnell Level brodie   normal coronary arteries  and lvf  . CARDIOVASCULAR STRESS TEST  03/31/2016   Low risk nuclear study w/ medium defect of mild severity in basal anterior and mid anterior location w/ no evidence ischemia or infarction/  normal LV function and wall motion,  nuclear stress ef 71%  . CATARACT EXTRACTION W/ INTRAOCULAR LENS IMPLANT Left 09/2016  . COLONOSCOPY  last one 04-24-2016  . HYSTEROSCOPY W/D&C N/A 12/22/2016   Procedure: DILATATION AND CURETTAGE /HYSTEROSCOPY;  Surgeon: Dian Queen, MD;  Location: [facility];  Service: Gynecology;  Laterality: N/A;  . IR US GUIDANCE  09/08/2016  . POSTERIOR LUMBAR FUSION  02/ 2018    [facility] ([place])  . SKIN CANCER EXCISION    . TONSILLECTOMY  child  . 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 TRANSTHORACIC ECHOCARDIOGRAM  01/21/2009   mild LVH, ef 41-66%, grade 1 diastolic dysfunction  . ULNAR NERVE TRANSPOSITION Left 12-20-2008  dr sypher   decompression and partial resection medial triceps fascia  . WIDE LOCAL EXCISION PILONIDAL AREA  02-02-2002  [name]   recurrent squamous cell carcinoma in  situ    There were no vitals filed for this visit.  Subjective Assessment - 04/17/19 1104    Subjective  Lots of relief after last visit.  No return of pain in Rt calf and Rt posterior thigh pain just returned yesterday.  The needling really helped.    Pertinent History  "Najai";  left TKR;  lumbar fusion; restless legs, HTN; diabetes;  mild neuropathy;  osteopenia    Limitations  House hold activities    How long can you stand comfortably?  2 minutes    How long can you walk comfortably?  5 min then hurts hips    Diagnostic tests  none    Patient Stated Goals  I would like to feel better, be more active; better quality of life    Currently in Pain?  Yes    Pain Score  3     Pain Location  Back    Pain  Orientation  Lower;Mid    Pain Descriptors / Indicators  Aching    Pain Type  Chronic pain    Pain Onset  More than a month ago    Pain Frequency  Intermittent    Aggravating Factors   stand/walk                       [facility] Adult PT Treatment/Exercise - 04/17/19 0001      Neuro Re-ed    Neuro Re-ed Details   prone TC bil multifidi L4-S1 knee bend with TrA 10x5 sec hold, Pt noted less pain when co-cueing TrA with knee bend      Lumbar Exercises: Stretches   Active Hamstring Stretch  Left;Right;30 seconds    Active Hamstring Stretch Limitations  seated      Lumbar Exercises: Standing   Other Standing Lumbar Exercises  (added to HEP) yellow band "hands to hips" shoulder ext and flexion bil x 10x5 sec holds for standing core recruitment, PT cued postural alignment for ribcage over pelvis/sternum over pubic bone to avoid hanging on lumbar spine      Knee/Hip Exercises: Standing   Hip Extension  Stengthening;Right;Left;10 reps;Knee straight    Extension Limitations  from small range countertop forearm plank to protect lumbar region from extension with end range hip ext      Manual Therapy   Joint Mobilization  sacral rocking bil Gr I-III       Trigger Point Dry Needling - 04/17/19 0001    Other Dry Needling  Rt side    Hamstring Response  Twitch response elicited;Palpable increased muscle length    Piriformis Response  Twitch response elicited;Palpable increased muscle length             PT Short Term Goals - 04/17/19 1107      PT SHORT TERM GOAL #1   Title  independent with initial HEP for mobility and strengthening    Status  Achieved      PT SHORT TERM GOAL #2   Title  The patient will have improved lumbar flexion to 60 degrees and HS muscle lengths to 70 degrees needed for improved household and community mobility    Status  Achieved      PT SHORT TERM GOAL #3   Title  The patient will be able to stand for 3-4 minutes for washing dishes at the sink     Status  Achieved      PT SHORT TERM GOAL #4   Title  The patient will report  back, hip, knee and right upper arm pain about 30% improved with daily activities    Baseline  met for back/hip 30% improvement, Lt knee still ongoing pain levels    Status  Partially Met        PT Long Term Goals - 03/09/19 2055      PT LONG TERM GOAL #1   Title  independent with advanced HEP    Time  8    Period  Weeks    Status  New    Target Date  05/04/19      PT LONG TERM GOAL #2   Title  The patient will be able to to walk puppy 8-12 min with 50% less pain    Time  8    Period  Weeks    Status  New      PT LONG TERM GOAL #3   Title  The patient will have improved lumbo/pelvic/shoulder/periscapular core strength grossly 4+/5 needed for walking and standing longer periods of time    Time  8    Period  Weeks    Status  New      PT LONG TERM GOAL #4   Title  The patient will have improved bil hip abduction/gluteal strength to 4/5 needed for standing >5 minutes    Time  8    Period  Weeks    Status  New      PT LONG TERM GOAL #5   Title  FOTO functional outcome score improved from 60% limitation to 51%    Time  8    Period  Weeks    Status  New      PT LONG TERM GOAL #6   Title  ...      PT LONG TERM GOAL #7   Title  ...            Plan - 04/17/19 1154    Clinical Impression Statement  Pt reported good carry over from last visit and continues to do well with DN and initial HEP.  Pt partially met STG today with 30% improvement in lumbar and hip pain but reports ongoing Lt knee pain.  Pt with good recruitment of bil multifidi in prone today with mild Lt LBP on activation which improved with co-cueing of TrA.  PT progressed core recruitment to standing yellow band shoulder pullbacks/forwards with need for TC to align posture to avoid locking out lumbar spine.  PT observed superior glute max atrophy Rt>Lt where tenderness is present.  PT added standing hip ext to HEP from forearm  plank on countertop as Pt was extending through lumbar spine from upright posture.  Pt noted "cold" feeling in Rt lateral hamstring after DN today.  Pt will benefit from continued PT along POC with progression of dynamic lumbar stabilization as able given improved core recruitment in static postures.    Comorbidities  Lumbar fusion;  left TKR with poor result; HTN; diabetes; asthma; osteopenia; restless legs    Rehab Potential  Good    PT Frequency  2x / week    PT Duration  8 weeks    PT Treatment/Interventions  ADLs/Self Care Home Management;Therapeutic exercise;Therapeutic activities;Balance training;Neuromuscular re-education;Patient/family education;Dry needling;Taping;Moist Heat;Cryotherapy;Electrical Stimulation;Ultrasound;Manual techniques;Traction    PT Next Visit Plan  f/u on standing yellow pullbacks/forwards and countertop hip ext added last visit, consider manual therapy for Lt knee and progression of LE/stab exercises (try pallof presses?)    PT Home Exercise Plan  Access Code: [email or web address]  Consulted and Agree with Plan of Care  Patient       Patient will benefit from skilled therapeutic intervention in order to improve the following deficits and impairments:     Visit Diagnosis: Chronic bilateral low back pain without sciatica  Muscle weakness (generalized)  Chronic pain of left knee  Difficulty in walking, not elsewhere classified  Other muscle spasm  Pain in right hip     Problem List Patient Active Problem List   Diagnosis Date Noted  . Carotid arterial disease ([place]) 01/23/2019  . RBBB (right bundle branch block with left anterior fascicular block)   . Chest pain 01/22/2019  . Weakness of both lower extremities 01/02/2019  . Frequent falls 01/02/2019  . Cellulitis of right elbow 12/29/2018  . OSA on CPAP 06/15/2017  . Diabetes ([place]) 03/02/2017  . Herpes zoster without complication 30/14/8403  . Prediabetes 02/23/2017  . Shortness of breath on exertion  02/23/2017  . B12 nutritional deficiency 02/23/2017  . Vitamin D deficiency 12/08/2016  . Tubular adenoma of colon 04/29/2016  . Essential hypertension 09/12/2015  . Allergic rhinitis 07/17/2015  . Insomnia 07/17/2015  . Obese 04/10/2015  . S/P left TKA 04/09/2015  . Osteopenia 12/04/2012  . GERD (gastroesophageal reflux disease) 02/07/2012  . 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 RESTLESS LEG SYNDROME 02/11/2009  . LEG EDEMA, BILATERAL 02/11/2009  . Hyperlipidemia 10/30/2008  . Urinary incontinence 10/30/2008  . Skin cancer 10/30/2008  . Asthma 11/21/2007  . Venous (peripheral) insufficiency 11/22/2006    Baruch Merl, PT 04/17/19 12:02 PM   [facility] [address] [place], [place], [address] Phone: [phone]   Fax:  [phone]  Name: Erin Roach MRN: [id number] Date of Birth: 02-12-1941

## 2019-04-18 ENCOUNTER — Encounter (HOSPITAL_COMMUNITY): Payer: Medicare Other

## 2019-04-19 ENCOUNTER — Other Ambulatory Visit: Payer: Self-pay | Admitting: Internal Medicine

## 2019-04-20 ENCOUNTER — Ambulatory Visit: Payer: Medicare Other | Admitting: Physical Therapy

## 2019-04-20 ENCOUNTER — Other Ambulatory Visit: Payer: Self-pay

## 2019-04-20 ENCOUNTER — Other Ambulatory Visit: Payer: Self-pay | Admitting: Internal Medicine

## 2019-04-20 ENCOUNTER — Encounter: Payer: Self-pay | Admitting: Physical Therapy

## 2019-04-20 DIAGNOSIS — M545 Low back pain, unspecified: Secondary | ICD-10-CM

## 2019-04-20 DIAGNOSIS — R262 Difficulty in walking, not elsewhere classified: Secondary | ICD-10-CM

## 2019-04-20 DIAGNOSIS — M25552 Pain in left hip: Secondary | ICD-10-CM

## 2019-04-20 DIAGNOSIS — G8929 Other chronic pain: Secondary | ICD-10-CM

## 2019-04-20 DIAGNOSIS — M25562 Pain in left knee: Secondary | ICD-10-CM

## 2019-04-20 DIAGNOSIS — M62838 Other muscle spasm: Secondary | ICD-10-CM

## 2019-04-20 DIAGNOSIS — M6281 Muscle weakness (generalized): Secondary | ICD-10-CM

## 2019-04-20 DIAGNOSIS — M25551 Pain in right hip: Secondary | ICD-10-CM

## 2019-04-20 NOTE — Therapy (Signed)
[facility] [address], [place], [place], [address] Phone: [phone]   Fax:  [phone]  Physical Therapy Treatment  Patient Details  Name: Erin Roach MRN: [id number] Date of Birth: Feb 25, 1941 Referring Provider (PT): Dr. Billey Gosling   Encounter Date: 04/20/2019  PT End of Session - 04/20/19 1452    Visit Number  9    Date for PT Re-Evaluation  05/04/19    Authorization Type  [employer]HC Medicare;  KX starting at visit 5 secondary to previous PT    PT Start Time  1446    PT Stop Time  1530    PT Time Calculation (min)  44 min       Past Medical History:  Diagnosis Date  . Allergic rhinitis   . Apnea   . Arthritis   . Asthma    very mild  . Back pain   . Bilateral lower extremity edema   . Carotid artery disease ([place])    a. Carotid duplex 03/2016 - duplex was stable, 37-90% RICA, 2-40% LICA  . Complication of anesthesia    post-op delirium  . Endometrial polyp   . Essential hypertension   . GERD (gastroesophageal reflux disease)   . Heart murmur    per pt  . History of adenomatous polyp of colon    04-24-2016  tubular adenoma  . History of squamous cell carcinoma excision    pilonidal area second excision 02-02-2002  . HTN (hypertension)   . Hyperlipidemia   . Leg pain   . OAB (overactive bladder)   . RA (rheumatoid arthritis) ([place])   . RBBB (right bundle branch block with left anterior fascicular block)   . RLS (restless legs syndrome)   . Shortness of breath on exertion   . SUI (stress urinary incontinence, female)   . Venous insufficiency    legs  . Vitamin D deficiency     Past Surgical History:  Procedure Laterality Date  . APPENDECTOMY  07-02-2005   [name]   open  . CARDIAC CATHETERIZATION  01-23-2009  dr Darnell Level brodie   normal coronary arteries and lvf  . CARDIOVASCULAR STRESS TEST  03/31/2016   Low risk nuclear study w/ medium defect of mild severity in basal anterior and  mid anterior location w/ no evidence ischemia or infarction/  normal LV function and wall motion,  nuclear stress ef 71%  . CATARACT EXTRACTION W/ INTRAOCULAR LENS IMPLANT Left 09/2016  . COLONOSCOPY  last one 04-24-2016  . HYSTEROSCOPY W/D&C N/A 12/22/2016   Procedure: DILATATION AND CURETTAGE /HYSTEROSCOPY;  Surgeon: Dian Queen, MD;  Location: [facility];  Service: Gynecology;  Laterality: N/A;  . IR US GUIDANCE  09/08/2016  . POSTERIOR LUMBAR FUSION  02/ 2018    [facility] ([place], [place])  . SKIN CANCER EXCISION    . TONSILLECTOMY  child  . 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 TRANSTHORACIC ECHOCARDIOGRAM  01/21/2009   mild LVH, ef 97-35%, grade 1 diastolic dysfunction  . ULNAR NERVE TRANSPOSITION Left 12-20-2008  dr sypher   decompression and partial resection medial triceps fascia  . WIDE LOCAL EXCISION PILONIDAL AREA  02-02-2002  [name]   recurrent squamous cell carcinoma in situ    There were no vitals filed for this visit.  Subjective Assessment - 04/20/19 1450    Subjective  Rt calf pain returned last night but had improved x 2 weeks, upper Rt hamstring bothered me in the night.  I don't feel any pain now though.    Pertinent History  "Yarielys";  left TKR;  lumbar fusion; restless legs, HTN; diabetes;  mild neuropathy;  osteopenia    How long can you stand comfortably?  2 minutes    How long can you walk comfortably?  5 min then hurts hips    Diagnostic tests  none    Patient Stated Goals  I would like to feel better, be more active; better quality of life    Currently in Pain?  No/denies    Pain Score  0-No pain                       [facility] Adult PT Treatment/Exercise - 04/20/19 0001      Self-Care   Other Self-Care Comments   stretch before bed to see if LE symptoms are relieved for better  night's rest      Neuro Re-ed    Neuro Re-ed Details   standing foot on 4" step yellow band pullbacks/forwards hands to hips, PT cued stand tall through stance hip for co-cue of deep core, shoulders, glutes, quads (trialed with 8" step to simulate use of Pt's step stool at home      Lumbar Exercises: Stretches   Active Hamstring Stretch  Right;Left;2 reps;30 seconds    Active Hamstring Stretch Limitations  seated    Lower Trunk Rotation  10 seconds;3 reps    Piriformis Stretch  Right;Left;1 rep;30 seconds      Lumbar Exercises: Aerobic   Nustep  L2 x 6', PT cued 30 sec intervals for SPM target of 80-90      Lumbar Exercises: Supine   Heel Slides  20 reps    Heel Slides Limitations  PT cued exhale on effort, done as march/hovered leg vs heel slide to advance challenge    Dead Bug  20 reps;2 seconds    Large Ball Abdominal Isometric  15 reps;3 seconds    Large Ball Abdominal Isometric Limitations  press with exhale VC    Other Supine Lumbar Exercises  alt marching with whole arm press down into table bil x 20 marches      Knee/Hip Exercises: Social research officer, government reps;Left;30 seconds    Gastroc Stretch Limitations  slant board      Knee/Hip Exercises: Supine   [employer]  Strengthening;10 reps;Both    [employer] Limitations  over firm foam roller               PT Short Term Goals - 04/17/19 1107      PT SHORT TERM GOAL #1   Title  independent with initial HEP for mobility and strengthening    Status  Achieved      PT SHORT TERM GOAL #2   Title  The patient will have improved lumbar flexion to 60 degrees and HS muscle lengths to 70 degrees needed for improved household and community mobility    Status  Achieved      PT SHORT TERM GOAL #3   Title  The patient will be able to stand for 3-4 minutes for washing dishes at the sink    Status  Achieved      PT SHORT TERM GOAL #4   Title  The patient will report back, hip, knee and right  upper arm pain about 30% improved with daily activities    Baseline  met for back/hip 30% improvement, Lt knee still ongoing pain levels    Status  Partially Met        PT Long Term Goals - 03/09/19 2055      PT LONG TERM GOAL #1   Title  independent with advanced HEP    Time  8    Period  Weeks    Status  New    Target Date  05/04/19      PT LONG TERM GOAL #2   Title  The patient will be able to to walk puppy 8-12 min with 50% less pain    Time  8    Period  Weeks    Status  New      PT LONG TERM GOAL #3   Title  The patient will have improved lumbo/pelvic/shoulder/periscapular core strength grossly 4+/5 needed for walking and standing longer periods of time    Time  8    Period  Weeks    Status  New      PT LONG TERM GOAL #4   Title  The patient will have improved bil hip abduction/gluteal strength to 4/5 needed for standing >5 minutes    Time  8    Period  Weeks    Status  New      PT LONG TERM GOAL #5   Title  FOTO functional outcome score improved from 60% limitation to 51%    Time  8    Period  Weeks    Status  New      PT LONG TERM GOAL #6   Title  ...      PT LONG TERM GOAL #7   Title  ...            Plan - 04/20/19 1529    Clinical Impression Statement  Pt with report of improvement overall in feeling stronger and able to more active with less pain.  Pt reported ongoing intermittent sleep disruption with Rt LE pain in calf and posterior thigh.  She admitted she hasn't been doing her stretches as much so PT encouraged Pt to stretch at night to see if this helps.  PT progressed standing neuro re-ed for postural stabilization today with foot on step to co-cue hip with core.  PT focused on cueing breath management with core stab in supine today with improved recruitment of TrA.  Pt with good tolerance of all ther ex and re-ed today with some expected Lt knee pain that did not increase from baseline.  Continue along POC with increased focus on strength and  mobility with careful monitoring and manual techniques as needed.    Comorbidities  Lumbar fusion;  left TKR with poor result; HTN; diabetes; asthma; osteopenia; restless legs    Rehab Potential  Good    PT Frequency  2x / week    PT Duration  8 weeks    PT Treatment/Interventions  ADLs/Self Care Home Management;Therapeutic exercise;Therapeutic activities;Balance training;Neuromuscular re-education;Patient/family education;Dry needling;Taping;Moist Heat;Cryotherapy;Electrical Stimulation;Ultrasound;Manual techniques;Traction    PT Next Visit Plan  continue KX, progress standing stabilization (add pallof press?), lumbar ROM/LE stretching, did stretching at night help sleep?    PT Home Exercise Plan  Access Code: [email or web address]    Consulted and Agree with Plan of Care  Patient       Patient will benefit from skilled therapeutic intervention in order to improve the following deficits and impairments:  Visit Diagnosis: Chronic bilateral low back pain without sciatica  Muscle weakness (generalized)  Chronic pain of left knee  Difficulty in walking, not elsewhere classified  Other muscle spasm  Pain in right hip  Pain in left hip     Problem List Patient Active Problem List   Diagnosis Date Noted  . Carotid arterial disease ([place]) 01/23/2019  . RBBB (right bundle branch block with left anterior fascicular block)   . Chest pain 01/22/2019  . Weakness of both lower extremities 01/02/2019  . Frequent falls 01/02/2019  . Cellulitis of right elbow 12/29/2018  . OSA on CPAP 06/15/2017  . Diabetes ([place]) 03/02/2017  . Herpes zoster without complication 37/08/8887  . Prediabetes 02/23/2017  . Shortness of breath on exertion 02/23/2017  . B12 nutritional deficiency 02/23/2017  . Vitamin D deficiency 12/08/2016  . Tubular adenoma of colon 04/29/2016  . Essential hypertension 09/12/2015  . Allergic rhinitis 07/17/2015  . Insomnia 07/17/2015  . Obese 04/10/2015  . S/P left TKA  04/09/2015  . Osteopenia 12/04/2012  . GERD (gastroesophageal reflux disease) 02/07/2012  . 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 RESTLESS LEG SYNDROME 02/11/2009  . LEG EDEMA, BILATERAL 02/11/2009  . Hyperlipidemia 10/30/2008  . Urinary incontinence 10/30/2008  . Skin cancer 10/30/2008  . Asthma 11/21/2007  . Venous (peripheral) insufficiency 11/22/2006    Baruch Merl, PT 04/20/19 3:32 PM   [facility] [address] [place], [place], [address] Phone: [phone]   Fax:  [phone]  Name: ARISTEA POSADA MRN: [id number] Date of Birth: [birth year]

## 2019-04-21 ENCOUNTER — Other Ambulatory Visit: Payer: Self-pay | Admitting: Internal Medicine

## 2019-04-23 ENCOUNTER — Other Ambulatory Visit: Payer: Self-pay | Admitting: Internal Medicine

## 2019-04-24 ENCOUNTER — Ambulatory Visit: Payer: Medicare Other | Admitting: Physical Therapy

## 2019-04-24 ENCOUNTER — Other Ambulatory Visit: Payer: Self-pay

## 2019-04-24 ENCOUNTER — Encounter: Payer: Self-pay | Admitting: Physical Therapy

## 2019-04-24 DIAGNOSIS — M6281 Muscle weakness (generalized): Secondary | ICD-10-CM

## 2019-04-24 DIAGNOSIS — M25552 Pain in left hip: Secondary | ICD-10-CM

## 2019-04-24 DIAGNOSIS — G8929 Other chronic pain: Secondary | ICD-10-CM

## 2019-04-24 DIAGNOSIS — M25551 Pain in right hip: Secondary | ICD-10-CM

## 2019-04-24 DIAGNOSIS — R262 Difficulty in walking, not elsewhere classified: Secondary | ICD-10-CM

## 2019-04-24 DIAGNOSIS — M62838 Other muscle spasm: Secondary | ICD-10-CM

## 2019-04-24 DIAGNOSIS — M25562 Pain in left knee: Secondary | ICD-10-CM

## 2019-04-24 DIAGNOSIS — M545 Low back pain, unspecified: Secondary | ICD-10-CM

## 2019-04-24 NOTE — Therapy (Signed)
[facility] [address] [place], [place], [address] Phone: [phone]   Fax:  [phone]  Physical Therapy Treatment  Progress Note Reporting Period 03/09/19 to 04/24/19  See note below for Objective Data and Assessment of Progress/Goals.       Patient Details  Name: Erin Roach MRN: [id number] Date of Birth: January 21, 1941 Referring Provider (PT): Dr. Billey Gosling   Encounter Date: 04/24/2019  PT End of Session - 04/24/19 1320    Visit Number  10    Date for PT Re-Evaluation  05/04/19    Authorization Type  [employer] Medicare;  KX starting at visit 5 secondary to previous PT    PT Start Time  1100    PT Stop Time  1145    PT Time Calculation (min)  45 min    Activity Tolerance  Patient tolerated treatment well;No increased pain    Behavior During Therapy  WFL for tasks assessed/performed       Past Medical History:  Diagnosis Date  . Allergic rhinitis   . Apnea   . Arthritis   . Asthma    very mild  . Back pain   . Bilateral lower extremity edema   . Carotid artery disease ([place])    a. Carotid duplex 03/2016 - duplex was stable, 69-62% RICA, 9-52% LICA  . Complication of anesthesia    post-op delirium  . Endometrial polyp   . Essential hypertension   . GERD (gastroesophageal reflux disease)   . Heart murmur    per pt  . History of adenomatous polyp of colon    04-24-2016  tubular adenoma  . History of squamous cell carcinoma excision    pilonidal area second excision 02-02-2002  . HTN (hypertension)   . Hyperlipidemia   . Leg pain   . OAB (overactive bladder)   . RA (rheumatoid arthritis) ([place])   . RBBB (right bundle branch block with left anterior fascicular block)   . RLS (restless legs syndrome)   . Shortness of breath on exertion   . SUI (stress urinary incontinence, female)   . Venous insufficiency    legs  . Vitamin D deficiency     Past Surgical History:  Procedure Laterality  Date  . APPENDECTOMY  07-02-2005   [name]   open  . CARDIAC CATHETERIZATION  01-23-2009  dr Darnell Level brodie   normal coronary arteries and lvf  . CARDIOVASCULAR STRESS TEST  03/31/2016   Low risk nuclear study w/ medium defect of mild severity in basal anterior and mid anterior location w/ no evidence ischemia or infarction/  normal LV function and wall motion,  nuclear stress ef 71%  . CATARACT EXTRACTION W/ INTRAOCULAR LENS IMPLANT Left 09/2016  . COLONOSCOPY  last one 04-24-2016  . HYSTEROSCOPY W/D&C N/A 12/22/2016   Procedure: DILATATION AND CURETTAGE /HYSTEROSCOPY;  Surgeon: Dian Queen, MD;  Location: [facility];  Service: Gynecology;  Laterality: N/A;  . IR US GUIDANCE  09/08/2016  . POSTERIOR LUMBAR FUSION  02/ 2018    [facility] ([place])  . SKIN CANCER EXCISION    . TONSILLECTOMY  child  . 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 TRANSTHORACIC ECHOCARDIOGRAM  01/21/2009   mild LVH, ef 84-13%, grade 1 diastolic dysfunction  . ULNAR NERVE TRANSPOSITION Left 12-20-2008  dr sypher   decompression and  partial resection medial triceps fascia  . WIDE LOCAL EXCISION PILONIDAL AREA  02-02-2002  [name]   recurrent squamous cell carcinoma in situ    There were no vitals filed for this visit.  Subjective Assessment - 04/24/19 1107    Subjective  Increased Rt LBP and posterior thigh and calf pain today.  Started overnight and is bad today.  Unsure whether the weather change could be an influence.    Pertinent History  "Erin Roach";  left TKR;  lumbar fusion; restless legs, HTN; diabetes;  mild neuropathy;  osteopenia    Limitations  House hold activities    How long can you stand comfortably?  3-4 min    How long can you walk comfortably?  10% improvement in ability to be on feet/active throughout day    Patient Stated  Goals  I would like to feel better, be more active; better quality of life    Currently in Pain?  Yes    Pain Score  5     Pain Location  Back    Pain Orientation  Lower;Right    Pain Descriptors / Indicators  Aching    Pain Type  Chronic pain    Pain Radiating Towards  posterior calf 3/10    Pain Onset  More than a month ago    Pain Frequency  Intermittent    Aggravating Factors   stand/walk    Pain Relieving Factors  sitting in supported chair, pain improved to 1/10 with history taking in chair today         [facility] PT Assessment - 04/24/19 0001      Assessment   Medical Diagnosis  LBP; hip pain, left knee pain; right upper arm pain     Referring Provider (PT)  Dr. Billey Gosling    Onset Date/Surgical Date  --   > 6 months    Next MD Visit  as needed     Prior Therapy  in past for TKR;  fusion; recently at [facility] for balance       AROM   Overall AROM Comments  bil UE ROM is WFLS without complaints of pain today.      Right Hip Extension  5    Right Hip Flexion  110    Right Hip External Rotation   30    Right Hip Internal Rotation   10    Left Hip Extension  5    Left Hip Flexion  110    Left Hip External Rotation   30    Left Hip Internal Rotation   10    Left Knee Flexion  121    Lumbar Flexion  50     Lumbar Extension  20       Strength   Overall Strength Comments  bil shoulder strength grossly 4/5     Right Hip Flexion  4+/5    Right Hip ABduction  4-/5    Left Hip Flexion  4+/5    Left Hip ABduction  3+/5    Right Knee Flexion  4+/5    Right Knee Extension  4+/5    Left Knee Flexion  4+/5    Left Knee Extension  4+/5    Lumbar Flexion  4/5    Lumbar Extension  4/5      Flexibility   Hamstrings  70 bil    Quadriceps  limited bil by 20%    ITB  limited bil by 20%      Palpation  Palpation comment  Rt lateral hamstring, med/lat gastroc on Rt, Rt posterior tibialis                   [facility] Adult PT Treatment/Exercise - 04/24/19 0001       Ambulation/Gait   Gait Comments  mild Trendelenburg, lack of thoracic rotation and pelvic transverse plane motion, PT cued ribcage over pelvis and avoid bracing, allow trunk rot/arm swing, manual facil of pelvic transverse plane motion      Self-Care   Other Self-Care Comments   walking for exercise on days she doesn't run a lot of errands, focus on trunk and pelvic motion      Knee/Hip Exercises: Stretches   Gastroc Stretch  Right;60 seconds    Gastroc Stretch Limitations  slant board    Soleus Stretch  Right;60 seconds    Soleus Stretch Limitations  slant board       Trigger Point Dry Needling - 04/24/19 0001    Consent Given?  Yes    Education Handout Provided  Previously provided    Muscles Treated Lower Quadrant  Posterior tibialis;Gastrocnemius    Other Dry Needling  Rt side    Posterior tibialis Response  Twitch response elicited;Palpable increased muscle length    Hamstring Response  Twitch response elicited;Palpable increased muscle length    Gastrocnemius Response  Twitch response elicited;Palpable increased muscle length   medial and lateral heads            PT Short Term Goals - 04/17/19 1107      PT SHORT TERM GOAL #1   Title  independent with initial HEP for mobility and strengthening    Status  Achieved      PT SHORT TERM GOAL #2   Title  The patient will have improved lumbar flexion to 60 degrees and HS muscle lengths to 70 degrees needed for improved household and community mobility    Status  Achieved      PT SHORT TERM GOAL #3   Title  The patient will be able to stand for 3-4 minutes for washing dishes at the sink    Status  Achieved      PT SHORT TERM GOAL #4   Title  The patient will report back, hip, knee and right upper arm pain about 30% improved with daily activities    Baseline  met for back/hip 30% improvement, Lt knee still ongoing pain levels    Status  Partially Met        PT Long Term Goals - 04/24/19 1112      PT LONG TERM GOAL  #1   Title  independent with advanced HEP    Baseline  compliant 5-7 days a week    Status  On-going      PT LONG TERM GOAL #2   Title  The patient will be able to walk 8-12 min with 50% less pain    Baseline  deferred b/c puppy is too strong for pt to handle, Pt admits needing to walk more often 3-4 days/week, some of it is running errands      PT LONG TERM GOAL #3   Title  The patient will have improved lumbo/pelvic/shoulder/periscapular core strength grossly 4+/5 needed for walking and standing longer periods of time    Baseline  4-/5 from 3+/5    Status  On-going      PT LONG TERM GOAL #4   Title  The patient will have improved bil hip abduction/gluteal strength  to 4/5 needed for standing >5 minutes    Baseline  4-/5    Status  On-going            Plan - 04/24/19 1321    Clinical Impression Statement  Pt with report of improving tolerance of standing and walking before pain onset.  She has met all but one STG and is making progress towards LTGs.  Her pain levels continue to be variable with report of 10% overall reduction in experience of pain.  She needed cueing/gait training today to prevent core strategy from becoming rigidity in trunk with gait.  PT suggested Pt return to use of compression stockings to see if this helps LE pain and tease out whether circulation is a factor.  PT performed extensive calf and post thigh dry needling today with good relief and release as Pt reported sleep disturbance from pain last night.  PT encouraged Pt to try short walks for exercise with gait training concept application today for improved trunk and pelvic mobility and less rigidity.  Pt with improving core awareness and strength with min need for postural alignment and demo'd improvements in bil hip strength today.  Pt will continue to benefit from skilled PT along POC with ongoing assessment of response to interventions.    Comorbidities  Lumbar fusion;  left TKR with poor result; HTN;  diabetes; asthma; osteopenia; restless legs    Rehab Potential  Good    PT Frequency  2x / week    PT Duration  8 weeks    PT Treatment/Interventions  ADLs/Self Care Home Management;Therapeutic exercise;Therapeutic activities;Balance training;Neuromuscular re-education;Patient/family education;Dry needling;Taping;Moist Heat;Cryotherapy;Electrical Stimulation;Ultrasound;Manual techniques;Traction    PT Next Visit Plan  continue KX, progress standing stab (add pallof press), lumbar ROM, LE stretching (quads, hams, gastroc, soleus, hip flexors)    PT Home Exercise Plan  Access Code: [email or web address]    Consulted and Agree with Plan of Care  Patient       Patient will benefit from skilled therapeutic intervention in order to improve the following deficits and impairments:     Visit Diagnosis: Chronic bilateral low back pain without sciatica  Muscle weakness (generalized)  Chronic pain of left knee  Difficulty in walking, not elsewhere classified  Other muscle spasm  Pain in right hip  Pain in left hip     Problem List Patient Active Problem List   Diagnosis Date Noted  . Carotid arterial disease ([place]) 01/23/2019  . RBBB (right bundle branch block with left anterior fascicular block)   . Chest pain 01/22/2019  . Weakness of both lower extremities 01/02/2019  . Frequent falls 01/02/2019  . Cellulitis of right elbow 12/29/2018  . OSA on CPAP 06/15/2017  . Diabetes ([place]) 03/02/2017  . Herpes zoster without complication 01/74/9449  . Prediabetes 02/23/2017  . Shortness of breath on exertion 02/23/2017  . B12 nutritional deficiency 02/23/2017  . Vitamin D deficiency 12/08/2016  . Tubular adenoma of colon 04/29/2016  . Essential hypertension 09/12/2015  . Allergic rhinitis 07/17/2015  . Insomnia 07/17/2015  . Obese 04/10/2015  . S/P left TKA 04/09/2015  . Osteopenia 12/04/2012  . GERD (gastroesophageal reflux disease) 02/07/2012  . 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 RESTLESS LEG  SYNDROME 02/11/2009  . LEG EDEMA, BILATERAL 02/11/2009  . Hyperlipidemia 10/30/2008  . Urinary incontinence 10/30/2008  . Skin cancer 10/30/2008  . Asthma 11/21/2007  . Venous (peripheral) insufficiency 11/22/2006   Baruch Merl, PT 04/24/19 1:29 PM   [facility] [address]  [address] [place], [place], [address] Phone: [phone]   Fax:  [phone]  Name: Erin Roach MRN: [id number] Date of Birth: [birth year]

## 2019-04-25 ENCOUNTER — Other Ambulatory Visit: Payer: Self-pay

## 2019-04-25 ENCOUNTER — Ambulatory Visit (HOSPITAL_COMMUNITY)
Admission: RE | Admit: 2019-04-25 | Discharge: 2019-04-25 | Disposition: A | Discharge status: Home / Self Care | Payer: Medicare Other | Source: Ambulatory Visit | Attending: Cardiovascular Disease | Admitting: Cardiovascular Disease

## 2019-04-25 DIAGNOSIS — I739 Peripheral vascular disease, unspecified: Secondary | ICD-10-CM | POA: Insufficient documentation

## 2019-04-27 ENCOUNTER — Encounter: Payer: Self-pay | Admitting: Physical Therapy

## 2019-04-27 ENCOUNTER — Other Ambulatory Visit: Payer: Self-pay

## 2019-04-27 ENCOUNTER — Ambulatory Visit: Payer: Medicare Other | Admitting: Physical Therapy

## 2019-04-27 DIAGNOSIS — M62838 Other muscle spasm: Secondary | ICD-10-CM

## 2019-04-27 DIAGNOSIS — M6281 Muscle weakness (generalized): Secondary | ICD-10-CM

## 2019-04-27 DIAGNOSIS — R262 Difficulty in walking, not elsewhere classified: Secondary | ICD-10-CM

## 2019-04-27 DIAGNOSIS — G8929 Other chronic pain: Secondary | ICD-10-CM

## 2019-04-27 DIAGNOSIS — M545 Low back pain, unspecified: Secondary | ICD-10-CM

## 2019-04-27 NOTE — Therapy (Signed)
[facility] [address], [place], [place], [address] Phone: [phone]   Fax:  [phone]  Physical Therapy Treatment  Patient Details  Name: Erin Roach MRN: [id number] Date of Birth: 1941/03/07 Referring Provider (PT): Dr. Billey Gosling   Encounter Date: 04/27/2019  PT End of Session - 04/27/19 1452    Visit Number  11    Date for PT Re-Evaluation  05/04/19    Authorization Type  [employer]HC Medicare;  KX starting at visit 5 secondary to previous PT    PT Start Time  1445    PT Stop Time  1530    PT Time Calculation (min)  45 min    Activity Tolerance  Patient tolerated treatment well;No increased pain    Behavior During Therapy  WFL for tasks assessed/performed       Past Medical History:  Diagnosis Date  . Allergic rhinitis   . Apnea   . Arthritis   . Asthma    very mild  . Back pain   . Bilateral lower extremity edema   . Carotid artery disease ([place])    a. Carotid duplex 03/2016 - duplex was stable, 27-25% RICA, 3-66% LICA  . Complication of anesthesia    post-op delirium  . Endometrial polyp   . Essential hypertension   . GERD (gastroesophageal reflux disease)   . Heart murmur    per pt  . History of adenomatous polyp of colon    04-24-2016  tubular adenoma  . History of squamous cell carcinoma excision    pilonidal area second excision 02-02-2002  . HTN (hypertension)   . Hyperlipidemia   . Leg pain   . OAB (overactive bladder)   . RA (rheumatoid arthritis) ([place])   . RBBB (right bundle branch block with left anterior fascicular block)   . RLS (restless legs syndrome)   . Shortness of breath on exertion   . SUI (stress urinary incontinence, female)   . Venous insufficiency    legs  . Vitamin D deficiency     Past Surgical History:  Procedure Laterality Date  . APPENDECTOMY  07-02-2005   [name]   open  . CARDIAC CATHETERIZATION  01-23-2009  dr Darnell Level brodie   normal coronary  arteries and lvf  . CARDIOVASCULAR STRESS TEST  03/31/2016   Low risk nuclear study w/ medium defect of mild severity in basal anterior and mid anterior location w/ no evidence ischemia or infarction/  normal LV function and wall motion,  nuclear stress ef 71%  . CATARACT EXTRACTION W/ INTRAOCULAR LENS IMPLANT Left 09/2016  . COLONOSCOPY  last one 04-24-2016  . HYSTEROSCOPY WITH D & C N/A 12/22/2016   Procedure: DILATATION AND CURETTAGE /HYSTEROSCOPY;  Surgeon: Dian Queen, MD;  Location: [facility];  Service: Gynecology;  Laterality: N/A;  . IR US GUIDANCE  09/08/2016  . POSTERIOR LUMBAR FUSION  02/ 2018    [facility] ([place])  . SKIN CANCER EXCISION    . TONSILLECTOMY  child  . 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 TRANSTHORACIC ECHOCARDIOGRAM  01/21/2009   mild LVH, ef 44-03%, grade 1 diastolic dysfunction  . ULNAR NERVE TRANSPOSITION Left 12-20-2008  dr sypher   decompression and partial resection medial triceps fascia  . WIDE LOCAL EXCISION PILONIDAL AREA  02-02-2002  [name]   recurrent squamous  cell carcinoma in situ    There were no vitals filed for this visit.  Subjective Assessment - 04/27/19 1450    Subjective  I am limping today for some reason due to Lt knee pain.  That is not my usual.  Rt calf and thigh are still tight.    Pertinent History  "[place]";  left TKR;  lumbar fusion; restless legs, HTN; diabetes;  mild neuropathy;  osteopenia    Limitations  House hold activities    How long can you stand comfortably?  3-4 min    How long can you walk comfortably?  10% improvement in ability to be on feet/active throughout day    Diagnostic tests  none    Patient Stated Goals  I would like to feel better, be more active; better quality of life    Currently in Pain?  Yes    Pain Score  3     Pain Location   Knee    Pain Orientation  Anterior;Left    Pain Descriptors / Indicators  Aching    Pain Type  Chronic pain    Pain Onset  More than a month ago    Pain Frequency  Intermittent    Aggravating Factors   walking, exercising                       [facility] Adult PT Treatment/Exercise - 04/27/19 0001      Ambulation/Gait   Pre-Gait Activities  TC/VC for facil of transverse reciprocal pelvic motion during gait with arm swing      Exercises   Exercises  Knee/Hip      Lumbar Exercises: Stretches   Other Lumbar Stretch Exercise  green ball rollouts trunk flexion and SB bil 5x5 sec each      Lumbar Exercises: Supine   Other Supine Lumbar Exercises  heel slide hovered leg bil alt x 20 reps with core cueing      Knee/Hip Exercises: Stretches   Active Hamstring Stretch  Both;2 reps;30 seconds    Hip Flexor Stretch  Both;2 reps;20 seconds    Hip Flexor Stretch Limitations  foot on 2nd step lunge style with pelvic tuck    Gastroc Stretch  Both;30 seconds;2 reps    Gastroc Stretch Limitations  slant board    Soleus Stretch  Both;30 seconds    Soleus Stretch Limitations  slant board bent knees      Knee/Hip Exercises: Aerobic   Nustep  seat 8 L3 x 6' PT present to discuss symptoms      Manual Therapy   Manual Therapy  Soft tissue mobilization;Joint mobilization    Joint Mobilization  Lt patellar glides all directions Gr II-III    Soft tissue mobilization  Lt peripatellar retrograde massage, med/lat joint lines, patellar tendon, distal ITB               PT Short Term Goals - 04/17/19 1107      PT SHORT TERM GOAL #1   Title  independent with initial HEP for mobility and strengthening    Status  Achieved      PT SHORT TERM GOAL #2   Title  The patient will have improved lumbar flexion to 60 degrees and HS muscle lengths to 70 degrees needed for improved household and community mobility    Status  Achieved      PT SHORT TERM GOAL #3   Title  The patient will be  able to stand for 3-4  minutes for washing dishes at the sink    Status  Achieved      PT SHORT TERM GOAL #4   Title  The patient will report back, hip, knee and right upper arm pain about 30% improved with daily activities    Baseline  met for back/hip 30% improvement, Lt knee still ongoing pain levels    Status  Partially Met        PT Long Term Goals - 04/24/19 1112      PT LONG TERM GOAL #1   Title  independent with advanced HEP    Baseline  compliant 5-7 days a week    Status  On-going      PT LONG TERM GOAL #2   Title  The patient will be able to walk 8-12 min with 50% less pain    Baseline  deferred b/c puppy is too strong for pt to handle, Pt admits needing to walk more often 3-4 days/week, some of it is running errands      PT LONG TERM GOAL #3   Title  The patient will have improved lumbo/pelvic/shoulder/periscapular core strength grossly 4+/5 needed for walking and standing longer periods of time    Baseline  4-/5 from 3+/5    Status  On-going      PT LONG TERM GOAL #4   Title  The patient will have improved bil hip abduction/gluteal strength to 4/5 needed for standing >5 minutes    Baseline  4-/5    Status  On-going            Plan - 04/27/19 1901    Clinical Impression Statement  Pt continues to have intermittent Rt posteiror thigh and calf pain which seems to be more painful at night.  She arrived with Lt knee pain and report of new limp but this resolved after warm up and LE stretching.  PT stressed importance of recognizing how being active and using HEP as tools for multiple pain locations can help manage and resolve pain as it comes/goes.  Pt with some mild crepitus under Lt patella today and min restriction of pateallar glides sup/inf>med/lat which improved with manual techniques.  PT continues to give TC and VCs for arm swing and transverse plane motion in reciprocal pattern of trunk and pelvis for less rigid posture and imrpoved efficiency of movement.  Pt  will continue to benefit from progression towards increased activity and use of ther ex for pain management.    Comorbidities  Lumbar fusion;  left TKR with poor result; HTN; diabetes; asthma; osteopenia; restless legs    Rehab Potential  Good    PT Frequency  2x / week    PT Duration  8 weeks    PT Treatment/Interventions  ADLs/Self Care Home Management;Therapeutic exercise;Therapeutic activities;Balance training;Neuromuscular re-education;Patient/family education;Dry needling;Taping;Moist Heat;Cryotherapy;Electrical Stimulation;Ultrasound;Manual techniques;Traction    PT Next Visit Plan  cont KX, progress standing stab (add row, pallof press, shoulder diagonals), cont lumbar ROM and LE stretches, do 6 min walk test    PT Home Exercise Plan  Access Code: [email or web address]    Consulted and Agree with Plan of Care  Patient       Patient will benefit from skilled therapeutic intervention in order to improve the following deficits and impairments:     Visit Diagnosis: Chronic bilateral low back pain without sciatica  Muscle weakness (generalized)  Chronic pain of left knee  Difficulty in walking, not elsewhere classified  Other muscle spasm     Problem  List Patient Active Problem List   Diagnosis Date Noted  . Carotid arterial disease ([place]) 01/23/2019  . RBBB (right bundle branch block with left anterior fascicular block)   . Chest pain 01/22/2019  . Weakness of both lower extremities 01/02/2019  . Frequent falls 01/02/2019  . Cellulitis of right elbow 12/29/2018  . OSA on CPAP 06/15/2017  . Diabetes ([place]) 03/02/2017  . Herpes zoster without complication 22/41/1464  . Prediabetes 02/23/2017  . Shortness of breath on exertion 02/23/2017  . B12 nutritional deficiency 02/23/2017  . Vitamin D deficiency 12/08/2016  . Tubular adenoma of colon 04/29/2016  . Essential hypertension 09/12/2015  . Allergic rhinitis 07/17/2015  . Insomnia 07/17/2015  . Obese 04/10/2015  . S/P left TKA  04/09/2015  . Osteopenia 12/04/2012  . GERD (gastroesophageal reflux disease) 02/07/2012  . 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 RESTLESS LEG SYNDROME 02/11/2009  . LEG EDEMA, BILATERAL 02/11/2009  . Hyperlipidemia 10/30/2008  . Urinary incontinence 10/30/2008  . Skin cancer 10/30/2008  . Asthma 11/21/2007  . Venous (peripheral) insufficiency 11/22/2006    Baruch Merl, PT 04/27/19 7:12 PM   [facility] [address] [place], [place], [address] Phone: [phone]   Fax:  [phone]  Name: Erin Roach MRN: [id number] Date of Birth: Jun 18, 1940

## 2019-04-28 ENCOUNTER — Other Ambulatory Visit (INDEPENDENT_AMBULATORY_CARE_PROVIDER_SITE_OTHER): Payer: Self-pay | Admitting: Family Medicine

## 2019-04-28 DIAGNOSIS — E119 Type 2 diabetes mellitus without complications: Secondary | ICD-10-CM

## 2019-04-29 ENCOUNTER — Other Ambulatory Visit: Payer: Self-pay | Admitting: Internal Medicine

## 2019-04-30 ENCOUNTER — Encounter: Payer: Self-pay | Admitting: Internal Medicine

## 2019-04-30 ENCOUNTER — Encounter (INDEPENDENT_AMBULATORY_CARE_PROVIDER_SITE_OTHER): Payer: Self-pay | Admitting: Family Medicine

## 2019-05-01 ENCOUNTER — Ambulatory Visit: Payer: Medicare Other | Attending: Internal Medicine

## 2019-05-01 ENCOUNTER — Encounter: Payer: Self-pay | Admitting: Internal Medicine

## 2019-05-01 DIAGNOSIS — Z20822 Contact with and (suspected) exposure to covid-19: Secondary | ICD-10-CM

## 2019-05-02 ENCOUNTER — Ambulatory Visit: Payer: Medicare Other | Admitting: Physical Therapy

## 2019-05-02 ENCOUNTER — Encounter: Payer: Self-pay | Admitting: Physical Therapy

## 2019-05-02 ENCOUNTER — Other Ambulatory Visit: Payer: Self-pay

## 2019-05-02 DIAGNOSIS — R262 Difficulty in walking, not elsewhere classified: Secondary | ICD-10-CM

## 2019-05-02 DIAGNOSIS — M6281 Muscle weakness (generalized): Secondary | ICD-10-CM

## 2019-05-02 DIAGNOSIS — M62838 Other muscle spasm: Secondary | ICD-10-CM

## 2019-05-02 DIAGNOSIS — G8929 Other chronic pain: Secondary | ICD-10-CM

## 2019-05-02 DIAGNOSIS — M545 Low back pain, unspecified: Secondary | ICD-10-CM

## 2019-05-02 DIAGNOSIS — M25562 Pain in left knee: Secondary | ICD-10-CM

## 2019-05-02 LAB — NOVEL CORONAVIRUS, NAA: SARS-CoV-2, NAA: NOT DETECTED

## 2019-05-02 MED ORDER — HYDROCODONE-ACETAMINOPHEN 5-325 MG PO TABS: 1.0000 | ORAL_TABLET | Freq: Every day | ORAL | 0 refills | Status: DC

## 2019-05-02 NOTE — Therapy (Signed)
[facility] [address] [place], [place], [address] Phone: [phone]   Fax:  [phone]  Physical Therapy Treatment  Patient Details  Name: Erin Roach MRN: [id number] Date of Birth: 1940-07-16 Referring Provider (PT): Dr. Billey Gosling   Encounter Date: 05/02/2019  PT End of Session - 05/02/19 1414    Visit Number  12    Date for PT Re-Evaluation  05/04/19    Authorization Type  [employer] Medicare;  KX starting at visit 5 secondary to previous PT    PT Start Time  1405   Pt late   PT Stop Time  1446    PT Time Calculation (min)  41 min    Activity Tolerance  Patient tolerated treatment well;No increased pain    Behavior During Therapy  WFL for tasks assessed/performed       Past Medical History:  Diagnosis Date  . Allergic rhinitis   . Apnea   . Arthritis   . Asthma    very mild  . Back pain   . Bilateral lower extremity edema   . Carotid artery disease ([place])    a. Carotid duplex 03/2016 - duplex was stable, 61-95% RICA, 0-93% LICA  . Complication of anesthesia    post-op delirium  . Endometrial polyp   . Essential hypertension   . GERD (gastroesophageal reflux disease)   . Heart murmur    per pt  . History of adenomatous polyp of colon    04-24-2016  tubular adenoma  . History of squamous cell carcinoma excision    pilonidal area second excision 02-02-2002  . HTN (hypertension)   . Hyperlipidemia   . Leg pain   . OAB (overactive bladder)   . RA (rheumatoid arthritis) ([place])   . RBBB (right bundle branch block with left anterior fascicular block)   . RLS (restless legs syndrome)   . Shortness of breath on exertion   . SUI (stress urinary incontinence, female)   . Venous insufficiency    legs  . Vitamin D deficiency     Past Surgical History:  Procedure Laterality Date  . APPENDECTOMY  07-02-2005   [name]   open  . CARDIAC CATHETERIZATION  01-23-2009  dr Darnell Level brodie   normal  coronary arteries and lvf  . CARDIOVASCULAR STRESS TEST  03/31/2016   Low risk nuclear study w/ medium defect of mild severity in basal anterior and mid anterior location w/ no evidence ischemia or infarction/  normal LV function and wall motion,  nuclear stress ef 71%  . CATARACT EXTRACTION W/ INTRAOCULAR LENS IMPLANT Left 09/2016  . COLONOSCOPY  last one 04-24-2016  . HYSTEROSCOPY WITH D & C N/A 12/22/2016   Procedure: DILATATION AND CURETTAGE /HYSTEROSCOPY;  Surgeon: Dian Queen, MD;  Location: [place];  Service: Gynecology;  Laterality: N/A;  . IR US GUIDANCE  09/08/2016  . POSTERIOR LUMBAR FUSION  02/ 2018    [facility] ([place])  . SKIN CANCER EXCISION    . TONSILLECTOMY  child  . 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 TRANSTHORACIC ECHOCARDIOGRAM  01/21/2009   mild LVH, ef 26-71%, grade 1 diastolic dysfunction  . ULNAR NERVE TRANSPOSITION Left 12-20-2008  dr sypher   decompression and partial resection medial triceps fascia  . WIDE LOCAL EXCISION PILONIDAL AREA  02-02-2002  [name]  recurrent squamous cell carcinoma in situ    There were no vitals filed for this visit.  Subjective Assessment - 05/02/19 1410    Subjective  I am doing pretty well.  I have variable pain in Rt LE and Lt knee.  I am doing more exercise consistently since being in PT which helps.    Pertinent History  "[place]";  left TKR;  lumbar fusion; restless legs, HTN; diabetes;  mild neuropathy;  osteopenia    Limitations  House hold activities    How long can you stand comfortably?  3-4 min    How long can you walk comfortably?  10% improvement in ability to be on feet/active throughout day    Diagnostic tests  none    Patient Stated Goals  I would like to feel better, be more active; better quality of life    Currently in Pain?  Yes     Pain Score  4     Pain Location  Knee    Pain Orientation  Left;Anterior    Pain Descriptors / Indicators  Aching    Pain Type  Chronic pain    Pain Onset  More than a month ago    Pain Frequency  Intermittent    Aggravating Factors   NuStep sometimes    Pain Relieving Factors  sitting in supported chair for my back                       [facility] Adult PT Treatment/Exercise - 05/02/19 0001      Exercises   Exercises  Knee/Hip;Lumbar      Lumbar Exercises: Aerobic   Nustep  L3 seat 7 arms 10 x 8', Pt present to discuss progress and goals      Lumbar Exercises: Standing   Row  Strengthening;10 reps;Theraband    Theraband Level (Row)  Level 3 (Green)    Row Limitations  Pt needed max VC and TC for form    Other Standing Lumbar Exercises  shoulder extension bil yellow band, PT TC for standing posture alignment, consistent need for cueing      Knee/Hip Exercises: Stretches   Gastroc Stretch  Both;30 seconds    Gastroc Stretch Limitations  slant board, bil simultaneously      Knee/Hip Exercises: Seated   Other Seated Knee/Hip Exercises  hip flexor isom march x 10 x 3 sec hold, PT cued awareness of abdominals    Marching  AROM;20 reps    Abd/Adduction Limitations  green band hip abd bil x 15 PT cued TrA    Sit to Sand  10 reps;with UE support   VC and demo by PT to use hip hinge more and avoid valgu     Knee/Hip Exercises: Supine   Bridges with Clamshell  Strengthening;Both;15 reps   green              PT Short Term Goals - 04/17/19 1107      PT SHORT TERM GOAL #1   Title  independent with initial HEP for mobility and strengthening    Status  Achieved      PT SHORT TERM GOAL #2   Title  The patient will have improved lumbar flexion to 60 degrees and HS muscle lengths to 70 degrees needed for improved household and community mobility    Status  Achieved      PT SHORT TERM GOAL #3   Title  The patient will be able to stand for 3-4  minutes for washing  dishes at the sink    Status  Achieved      PT SHORT TERM GOAL #4   Title  The patient will report back, hip, knee and right upper arm pain about 30% improved with daily activities    Baseline  met for back/hip 30% improvement, Lt knee still ongoing pain levels    Status  Partially Met        PT Long Term Goals - 05/02/19 1415      PT LONG TERM GOAL #1   Title  independent with advanced HEP    Baseline  compliant 6 days/week, working on more dynamic exercise and standing core transition    Status  On-going      PT LONG TERM GOAL #2   Title  The patient will be able to walk 8-12 min with 50% less pain    Baseline  using better transverse plane motion with arm swing, Pt can't give numbers      PT LONG TERM GOAL #3   Title  The patient will have improved lumbo/pelvic/shoulder/periscapular core strength grossly 4+/5 needed for walking and standing longer periods of time    Baseline  4-/5 from 3+/5    Status  On-going      PT LONG TERM GOAL #4   Title  The patient will have improved bil hip abduction/gluteal strength to 4/5 needed for standing >5 minutes    Baseline  4-/5    Status  On-going      PT LONG TERM GOAL #5   Title  FOTO functional outcome score improved from 60% limitation to 51%    Status  On-going            Plan - 05/02/19 1726    Clinical Impression Statement  Pt has intermittent pain that comes and goes in Rt LE and Lt knee.  PT discussed how progression of ROM, stretching, strength and stabilization will likely benefit ongoing management of pain with improved activity level.  Pt agreeable to this plan.  She needed signif TC and VC for standing and sitting posture during ther ex and stab training today.  She had initial increase in Lt knee pain which diminished with hip abduction strength training. PT will re-evaluate at next visit with anticipated need to extend PT with more focus on strength and stabilization with cueing and supervision for prooper form and  muscle recruitment.    Comorbidities  Lumbar fusion;  left TKR with poor result; HTN; diabetes; asthma; osteopenia; restless legs    Examination-Activity Limitations  Locomotion Level;Lift;Other;Stairs;Sleep    PT Frequency  2x / week    PT Duration  8 weeks    PT Treatment/Interventions  ADLs/Self Care Home Management;Therapeutic exercise;Therapeutic activities;Balance training;Neuromuscular re-education;Patient/family education;Dry needling;Taping;Moist Heat;Cryotherapy;Electrical Stimulation;Ultrasound;Manual techniques;Traction    PT Next Visit Plan  re-eval next visit, revisit seated march, hip flex isom, hip abd, bridge with hip abd, update HEP, try SLR or SAQ for Lt knee.    PT Home Exercise Plan  Access Code: [email or web address]    Consulted and Agree with Plan of Care  Patient       Patient will benefit from skilled therapeutic intervention in order to improve the following deficits and impairments:     Visit Diagnosis: Chronic bilateral low back pain without sciatica  Muscle weakness (generalized)  Chronic pain of left knee  Difficulty in walking, not elsewhere classified  Other muscle spasm     Problem List Patient Active Problem List  Diagnosis Date Noted  . Carotid arterial disease ([place]) 01/23/2019  . RBBB (right bundle branch block with left anterior fascicular block)   . Chest pain 01/22/2019  . Weakness of both lower extremities 01/02/2019  . Frequent falls 01/02/2019  . Cellulitis of right elbow 12/29/2018  . OSA on CPAP 06/15/2017  . Diabetes ([place]) 03/02/2017  . Herpes zoster without complication 51/06/5850  . Prediabetes 02/23/2017  . Shortness of breath on exertion 02/23/2017  . B12 nutritional deficiency 02/23/2017  . Vitamin D deficiency 12/08/2016  . Tubular adenoma of colon 04/29/2016  . Essential hypertension 09/12/2015  . Allergic rhinitis 07/17/2015  . Insomnia 07/17/2015  . Obese 04/10/2015  . S/P left TKA 04/09/2015  . Osteopenia 12/04/2012  .  GERD (gastroesophageal reflux disease) 02/07/2012  . 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 RESTLESS LEG SYNDROME 02/11/2009  . LEG EDEMA, BILATERAL 02/11/2009  . Hyperlipidemia 10/30/2008  . Urinary incontinence 10/30/2008  . Skin cancer 10/30/2008  . Asthma 11/21/2007  . Venous (peripheral) insufficiency 11/22/2006    Baruch Merl, PT 05/02/19 5:32 PM   [facility] [address] [place], [place], [address] Phone: [phone]   Fax:  [phone]  Name: LYLAH LANTIS MRN: [id number] Date of Birth: Mar 04, 1941

## 2019-05-02 NOTE — Telephone Encounter (Signed)
Losartan already been refilled. Pls advise on hydrocodone. Check [place] registry last filled 04/04/2019.Marland KitchenJohny Chess

## 2019-05-04 ENCOUNTER — Other Ambulatory Visit: Payer: Self-pay

## 2019-05-04 ENCOUNTER — Ambulatory Visit: Payer: Medicare Other | Admitting: Physical Therapy

## 2019-05-04 ENCOUNTER — Encounter: Payer: Self-pay | Admitting: Physical Therapy

## 2019-05-04 DIAGNOSIS — G8929 Other chronic pain: Secondary | ICD-10-CM

## 2019-05-04 DIAGNOSIS — M545 Low back pain, unspecified: Secondary | ICD-10-CM

## 2019-05-04 DIAGNOSIS — R262 Difficulty in walking, not elsewhere classified: Secondary | ICD-10-CM

## 2019-05-04 DIAGNOSIS — M6281 Muscle weakness (generalized): Secondary | ICD-10-CM

## 2019-05-04 DIAGNOSIS — M25552 Pain in left hip: Secondary | ICD-10-CM

## 2019-05-04 DIAGNOSIS — M25551 Pain in right hip: Secondary | ICD-10-CM

## 2019-05-04 DIAGNOSIS — M62838 Other muscle spasm: Secondary | ICD-10-CM

## 2019-05-04 NOTE — Therapy (Signed)
[facility] [address], [place], [place], [address] Phone: [phone]   Fax:  [phone]  Physical Therapy Treatment  Patient Details  Name: Erin Roach MRN: [id number] Date of Birth: [birth year] Referring Provider (PT): Dr. Billey Gosling   Encounter Date: 05/04/2019    Past Medical History:  Diagnosis Date  . Allergic rhinitis   . Apnea   . Arthritis   . Asthma    very mild  . Back pain   . Bilateral lower extremity edema   . Carotid artery disease ([place])    a. Carotid duplex 03/2016 - duplex was stable, 38-46% RICA, 6-59% LICA  . Complication of anesthesia    post-op delirium  . Endometrial polyp   . Essential hypertension   . GERD (gastroesophageal reflux disease)   . Heart murmur    per pt  . History of adenomatous polyp of colon    04-24-2016  tubular adenoma  . History of squamous cell carcinoma excision    pilonidal area second excision 02-02-2002  . HTN (hypertension)   . Hyperlipidemia   . Leg pain   . OAB (overactive bladder)   . RA (rheumatoid arthritis) ([place])   . RBBB (right bundle branch block with left anterior fascicular block)   . RLS (restless legs syndrome)   . Shortness of breath on exertion   . SUI (stress urinary incontinence, female)   . Venous insufficiency    legs  . Vitamin D deficiency     Past Surgical History:  Procedure Laterality Date  . APPENDECTOMY  07-02-2005   [name]   open  . CARDIAC CATHETERIZATION  01-23-2009  dr Darnell Level brodie   normal coronary arteries and lvf  . CARDIOVASCULAR STRESS TEST  03/31/2016   Low risk nuclear study w/ medium defect of mild severity in basal anterior and mid anterior location w/ no evidence ischemia or infarction/  normal LV function and wall motion,  nuclear stress ef 71%  . CATARACT EXTRACTION W/ INTRAOCULAR LENS IMPLANT Left 09/2016  . COLONOSCOPY  last one 04-24-2016  . HYSTEROSCOPY WITH D & C N/A 12/22/2016    Procedure: DILATATION AND CURETTAGE /HYSTEROSCOPY;  Surgeon: Dian Queen, MD;  Location: [place];  Service: Gynecology;  Laterality: N/A;  . IR US GUIDANCE  09/08/2016  . POSTERIOR LUMBAR FUSION  02/ 2018    [facility] ([place])  . SKIN CANCER EXCISION    . TONSILLECTOMY  child  . 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 TRANSTHORACIC ECHOCARDIOGRAM  01/21/2009   mild LVH, ef 93-57%, grade 1 diastolic dysfunction  . ULNAR NERVE TRANSPOSITION Left 12-20-2008  dr sypher   decompression and partial resection medial triceps fascia  . WIDE LOCAL EXCISION PILONIDAL AREA  02-02-2002  [name]   recurrent squamous cell carcinoma in situ    There were no vitals filed for this visit.  Subjective Assessment - 05/04/19 0935    Subjective  Pt reports continued pain that is more intermittent and some days it doesn't get as high as it used to.  Pain comes and goes in Rt low back and hip and the exercises help.  Lt knee bothers me with use.    Pertinent History  "Erin Roach";  left TKR;  lumbar fusion; restless legs, HTN; diabetes;  mild neuropathy;  osteopenia    How  long can you stand comfortably?  6-8 min    How long can you walk comfortably?  10% improvement in ability to be on feet/active throughout day    Diagnostic tests  none    Patient Stated Goals  I would like to feel better, be more active; better quality of life    Currently in Pain?  Yes    Pain Score  2     Pain Location  Knee    Pain Orientation  Left;Anterior    Pain Descriptors / Indicators  Aching    Pain Type  Chronic pain    Aggravating Factors   use, walking    Pain Score  4    Pain Location  Back    Pain Orientation  Right;Posterior    Pain Descriptors / Indicators  Aching    Pain Type  Chronic pain    Pain Radiating Towards  Rt buttock and hip    Pain Onset   More than a month ago    Pain Frequency  Intermittent    Aggravating Factors   walk, stand    Pain Relieving Factors  exercises         [facility] PT Assessment - 05/04/19 0001      Assessment   Medical Diagnosis  weakness LEs; falls    Referring Provider (PT)  Dr. Billey Gosling    Onset Date/Surgical Date  08/09/18    Next MD Visit  as needed     Prior Therapy  in past for TKR;  fusion       Observation/Other Assessments   Focus on Therapeutic Outcomes (FOTO)   47%      Posture/Postural Control   Posture Comments  needs cueing in standing to bring ribcage over pelvis, control awareness worsens with addition of standing exercise      ROM / Strength   AROM / PROM / Strength  AROM;Strength      AROM   Overall AROM Comments  trunk ROM WFL without pain, bil knee flexion 122 deg with pain end range on Lt      Strength   Overall Strength Comments  able to rise from a chair without UE use;  able to heel rise     Right Hip Flexion  4+/5    Right Hip ABduction  4/5    Left Hip Flexion  4/5    Left Hip External Rotation  4+/5    Left Hip ABduction  4/5    Right Knee Flexion  4+/5    Right Knee Extension  4+/5    Left Knee Flexion  4+/5    Left Knee Extension  4/5    Right Ankle Dorsiflexion  4/5    Right Ankle Plantar Flexion  4/5    Left Ankle Dorsiflexion  4+/5    Left Ankle Plantar Flexion  4+/5    Lumbar Flexion  4/5    Lumbar Extension  4+/5      Flexibility   Hamstrings  70 bil    Quadriceps  limited bil by 10% on Lt    ITB  limited by 10% on Lt      Palpation   Palpation comment  tenderness: Lt quad and ITB, Rt piriformis, glute min, lumbar MF L4/5      6 Minute Walk- Baseline   6 Minute Walk- Baseline  yes   modified to 3 min, 480', knee pain 3/10, back 3/10  [facility] Adult PT Treatment/Exercise - 05/04/19 0001      Self-Care   Self-Care  Other Self-Care Comments    Other Self-Care Comments   massage roller to Lt quad and ITB, showed Pt  online where she can purchase      Exercises   Exercises  --   review of HEP with updates for hip abd progression     Lumbar Exercises: Aerobic   Nustep  L3 seat 7 with arms x 9', PT present to do FOTO and review goals      Lumbar Exercises: Seated   Other Seated Lumbar Exercises  clamshell with green band    progressed to blue for HEP     Knee/Hip Exercises: Supine   Bridges with Clamshell  Strengthening;Both;15 reps   green, progressed to blue for HEP     Manual Therapy   Soft tissue mobilization  Lt quad and ITB spiky massage roller stick               PT Short Term Goals - 04/17/19 1107      PT SHORT TERM GOAL #1   Title  independent with initial HEP for mobility and strengthening    Status  Achieved      PT SHORT TERM GOAL #2   Title  The patient will have improved lumbar flexion to 60 degrees and HS muscle lengths to 70 degrees needed for improved household and community mobility    Status  Achieved      PT SHORT TERM GOAL #3   Title  The patient will be able to stand for 3-4 minutes for washing dishes at the sink    Status  Achieved      PT SHORT TERM GOAL #4   Title  The patient will report back, hip, knee and right upper arm pain about 30% improved with daily activities    Baseline  met for back/hip 30% improvement, Lt knee still ongoing pain levels    Status  Partially Met        PT Long Term Goals - 05/04/19 0943      PT LONG TERM GOAL #1   Title  independent with advanced HEP    Baseline  compliant 6 days/week, working on more dynamic exercise and standing core transition    Time  7    Period  Weeks    Status  On-going    Target Date  06/22/19      PT LONG TERM GOAL #2   Title  The patient will be able to walk 8-12 min with 50% less pain    Baseline  using better transverse plane motion with arm swing, 3 min walk test caused pain 3/10 for back and Lt knee    Time  7    Period  Weeks    Status  On-going    Target Date  06/22/19      PT  LONG TERM GOAL #3   Title  The patient will have improved lumbo/pelvic/shoulder/periscapular core strength grossly 4+/5 needed for walking and standing longer periods of time    Baseline  Pt with strength ranging from 4- to 4+/5 in hip abd bil, hip flexors, knee extensors, hip rotators and trunk flexors/core, needs cueing for dynamic functional form    Time  7    Period  Weeks    Status  New    Target Date  06/22/19      PT LONG TERM GOAL #4   Title  The patient will have improved bil hip abduction/gluteal strength to 4/5 needed for standing >5 minutes    Baseline  hip strength 4-/5 to 4+/5    Time  7    Period  Weeks    Status  On-going    Target Date  06/22/19      PT LONG TERM GOAL #5   Title  FOTO functional outcome score improved from 60% limitation to 51%    Baseline  47% 05/04/19    Status  Achieved            Plan - 05/04/19 1126    Clinical Impression Statement  Pt reports ongoing pain but with reduction in frequency and ability to do more before onset.  She continues to be limited in standing and walking x approx 5-8 min.  She performed 3 min walk test today covering 480' with pain increasing from 0/10 to 3/10 in both Rt lumbar/hip and Lt knee with test.  Strength has improved to ranging from 4-/5 to 4+/5 with weakness most notable in trunk stabilizers, trunk flexors, hip flexors and abductors.  She continues to need frequent cueing for postural awareness in standing especially with overlay of UE/LE strength and functional movements such as squatting.  She met FOTO LTG today reducing limitation from 60% to 47%.  She has Lt LE > Rt LE flexibility restrictions surrounding Lt knee primarily.  PT encouraged more compliance with stretchind and addition of use of massage roller for quad/ITB area on Lt.  PT continues to remind Pt that her goals should focus on increased function with pain management via use of tools she is learning at PT vs complete abolishment of pain.  She will  benefit from extension of PT to progress endurance, strength, flexibility, gait tolerance and dynamic postural control especially in standing and transfers.  Slower progress due to multiple pain areas and chronic pain history.    Comorbidities  Lumbar fusion;  left TKR with poor result; HTN; diabetes; asthma; osteopenia; restless legs    Examination-Activity Limitations  Locomotion Level;Lift;Other;Stairs;Sleep    Examination-Participation Restrictions  Community Activity;Other;Shop;Meal Prep    Stability/Clinical Decision Making  Evolving/Moderate complexity    Rehab Potential  Good    PT Frequency  2x / week    PT Duration  Other (comment)   7 weeks   PT Treatment/Interventions  ADLs/Self Care Home Management;Therapeutic exercise;Therapeutic activities;Balance training;Neuromuscular re-education;Patient/family education;Dry needling;Taping;Moist Heat;Cryotherapy;Electrical Stimulation;Ultrasound;Manual techniques;Traction    PT Next Visit Plan  f/u on massage roller and hip abd progression, continue working on gait endurance, LE flexibility, hip flexor and abd strength, core and posture awareness    PT Home Exercise Plan  Access Code: [email or web address]    Consulted and Agree with Plan of Care  Patient       Patient will benefit from skilled therapeutic intervention in order to improve the following deficits and impairments:  Difficulty walking, Increased fascial restricitons, Increased muscle spasms, Impaired UE functional use, Pain, Impaired perceived functional ability, Decreased activity tolerance, Impaired flexibility, Decreased strength  Visit Diagnosis: Chronic bilateral low back pain without sciatica - Plan: PT plan of care cert/re-cert  Muscle weakness (generalized) - Plan: PT plan of care cert/re-cert  Chronic pain of left knee - Plan: PT plan of care cert/re-cert  Difficulty in walking, not elsewhere classified - Plan: PT plan of care cert/re-cert  Other muscle spasm - Plan: PT plan of  care cert/re-cert  Pain in right hip - Plan: PT plan of care cert/re-cert  Pain  in left hip - Plan: PT plan of care cert/re-cert     Problem List Patient Active Problem List   Diagnosis Date Noted  . Carotid arterial disease ([place]) 01/23/2019  . RBBB (right bundle branch block with left anterior fascicular block)   . Chest pain 01/22/2019  . Weakness of both lower extremities 01/02/2019  . Frequent falls 01/02/2019  . Cellulitis of right elbow 12/29/2018  . OSA on CPAP 06/15/2017  . Diabetes ([place]) 03/02/2017  . Herpes zoster without complication 16/55/3748  . Prediabetes 02/23/2017  . Shortness of breath on exertion 02/23/2017  . B12 nutritional deficiency 02/23/2017  . Vitamin D deficiency 12/08/2016  . Tubular adenoma of colon 04/29/2016  . Essential hypertension 09/12/2015  . Allergic rhinitis 07/17/2015  . Insomnia 07/17/2015  . Obese 04/10/2015  . S/P left TKA 04/09/2015  . Osteopenia 12/04/2012  . GERD (gastroesophageal reflux disease) 02/07/2012  . 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 RESTLESS LEG SYNDROME 02/11/2009  . LEG EDEMA, BILATERAL 02/11/2009  . Hyperlipidemia 10/30/2008  . Urinary incontinence 10/30/2008  . Skin cancer 10/30/2008  . Asthma 11/21/2007  . Venous (peripheral) insufficiency 11/22/2006    Baruch Merl, PT 05/04/19 11:39 AM   [facility] [address], [place], [place], [address] Phone: [phone]   Fax:  [phone]  Name: BREANA LITTS MRN: [id number] Date of Birth: Apr 09, 1941

## 2019-05-08 ENCOUNTER — Other Ambulatory Visit: Payer: Self-pay

## 2019-05-08 ENCOUNTER — Encounter: Payer: Self-pay | Admitting: Physical Therapy

## 2019-05-08 ENCOUNTER — Ambulatory Visit: Payer: Medicare Other | Admitting: Physical Therapy

## 2019-05-08 DIAGNOSIS — R262 Difficulty in walking, not elsewhere classified: Secondary | ICD-10-CM

## 2019-05-08 DIAGNOSIS — M62838 Other muscle spasm: Secondary | ICD-10-CM

## 2019-05-08 DIAGNOSIS — M6281 Muscle weakness (generalized): Secondary | ICD-10-CM

## 2019-05-08 DIAGNOSIS — M25551 Pain in right hip: Secondary | ICD-10-CM

## 2019-05-08 DIAGNOSIS — M25562 Pain in left knee: Secondary | ICD-10-CM

## 2019-05-08 DIAGNOSIS — G8929 Other chronic pain: Secondary | ICD-10-CM

## 2019-05-08 DIAGNOSIS — M545 Low back pain, unspecified: Secondary | ICD-10-CM

## 2019-05-08 DIAGNOSIS — M25552 Pain in left hip: Secondary | ICD-10-CM

## 2019-05-08 NOTE — Therapy (Signed)
[facility] [address], [place], [place], [address] Phone: [phone]   Fax:  [phone]  Physical Therapy Treatment  Patient Details  Name: Erin Roach MRN: [id number] Date of Birth: September 05, 1940 Referring Provider (PT): Dr. Billey Gosling   Encounter Date: 05/08/2019  PT End of Session - 05/08/19 1107    Visit Number  14    Date for PT Re-Evaluation  06/15/19    Authorization Type  [employer] Medicare;  KX starting at visit 5 secondary to previous PT    PT Start Time  1100    PT Stop Time  1144    PT Time Calculation (min)  44 min    Activity Tolerance  Patient tolerated treatment well;No increased pain    Behavior During Therapy  WFL for tasks assessed/performed       Past Medical History:  Diagnosis Date  . Allergic rhinitis   . Apnea   . Arthritis   . Asthma    very mild  . Back pain   . Bilateral lower extremity edema   . Carotid artery disease ([place])    a. Carotid duplex 03/2016 - duplex was stable, 10-93% RICA, 2-35% LICA  . Complication of anesthesia    post-op delirium  . Endometrial polyp   . Essential hypertension   . GERD (gastroesophageal reflux disease)   . Heart murmur    per pt  . History of adenomatous polyp of colon    04-24-2016  tubular adenoma  . History of squamous cell carcinoma excision    pilonidal area second excision 02-02-2002  . HTN (hypertension)   . Hyperlipidemia   . Leg pain   . OAB (overactive bladder)   . RA (rheumatoid arthritis) ([place])   . RBBB (right bundle branch block with left anterior fascicular block)   . RLS (restless legs syndrome)   . Shortness of breath on exertion   . SUI (stress urinary incontinence, female)   . Venous insufficiency    legs  . Vitamin D deficiency     Past Surgical History:  Procedure Laterality Date  . APPENDECTOMY  07-02-2005   [name]   open  . CARDIAC CATHETERIZATION  01-23-2009  dr Darnell Level brodie   normal coronary  arteries and lvf  . CARDIOVASCULAR STRESS TEST  03/31/2016   Low risk nuclear study w/ medium defect of mild severity in basal anterior and mid anterior location w/ no evidence ischemia or infarction/  normal LV function and wall motion,  nuclear stress ef 71%  . CATARACT EXTRACTION W/ INTRAOCULAR LENS IMPLANT Left 09/2016  . COLONOSCOPY  last one 04-24-2016  . HYSTEROSCOPY WITH D & C N/A 12/22/2016   Procedure: DILATATION AND CURETTAGE /HYSTEROSCOPY;  Surgeon: Dian Queen, MD;  Location: [facility];  Service: Gynecology;  Laterality: N/A;  . IR US GUIDANCE  09/08/2016  . POSTERIOR LUMBAR FUSION  02/ 2018    [facility] ([place])  . SKIN CANCER EXCISION    . TONSILLECTOMY  child  . 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 TRANSTHORACIC ECHOCARDIOGRAM  01/21/2009   mild LVH, ef 57-32%, grade 1 diastolic dysfunction  . ULNAR NERVE TRANSPOSITION Left 12-20-2008  dr sypher   decompression and partial resection medial triceps fascia  . WIDE LOCAL EXCISION PILONIDAL AREA  02-02-2002  [name]   recurrent squamous  cell carcinoma in situ    There were no vitals filed for this visit.  Subjective Assessment - 05/08/19 1105    Subjective  Feeling pretty good, using the spikey roller which helps.  Noticing some bil hip soreness from new exercises.    Pertinent History  "Erin Roach";  left TKR;  lumbar fusion; restless legs, HTN; diabetes;  mild neuropathy;  osteopenia    Limitations  House hold activities    How long can you stand comfortably?  6-8 min    How long can you walk comfortably?  10% improvement in ability to be on feet/active throughout day    Diagnostic tests  none    Patient Stated Goals  I would like to feel better, be more active; better quality of life    Currently in Pain?  Yes    Pain Score  1     Pain Location  Knee     Pain Orientation  Left    Pain Descriptors / Indicators  Aching    Pain Onset  More than a month ago    Aggravating Factors   use, walking                       [facility] Adult PT Treatment/Exercise - 05/08/19 0001      Lumbar Exercises: Stretches   Lower Trunk Rotation  10 seconds;3 reps    Lower Trunk Rotation Limitations  bil    Other Lumbar Stretch Exercise  seated trunk flexion hang between legs x 30 sec      Lumbar Exercises: Aerobic   Other Aerobic Exercise  4' walk test for gait endurance, arm swing, transverse plane motion awareness      Lumbar Exercises: Seated   Other Seated Lumbar Exercises  clam and march with blue band x 20 each, PT cued seated posture alignment    Other Seated Lumbar Exercises  4# dumbbell hip to hip, hip to shoulder bil       Lumbar Exercises: Supine   Other Supine Lumbar Exercises  heel slides with core cueing bil x 15 reps      Knee/Hip Exercises: Stretches   Active Hamstring Stretch  Left;Right;2 reps;30 seconds    Piriformis Stretch  Both;2 reps;30 seconds    Piriformis Stretch Limitations  seated knee hug foot crossed    Gastroc Stretch  2 reps;Both;30 seconds      Knee/Hip Exercises: Seated   Sit to Sand  15 reps;without UE support   holding 5#     Knee/Hip Exercises: Supine   Straight Leg Raises  10 reps;2 sets    Other Supine Knee/Hip Exercises  heels on red ball roll in/out with core contractions x 10 reps, 2 sets               PT Short Term Goals - 04/17/19 1107      PT SHORT TERM GOAL #1   Title  independent with initial HEP for mobility and strengthening    Status  Achieved      PT SHORT TERM GOAL #2   Title  The patient will have improved lumbar flexion to 60 degrees and HS muscle lengths to 70 degrees needed for improved household and community mobility    Status  Achieved      PT SHORT TERM GOAL #3   Title  The patient will be able to stand for 3-4 minutes for washing dishes at the sink    Status   Achieved  PT SHORT TERM GOAL #4   Title  The patient will report back, hip, knee and right upper arm pain about 30% improved with daily activities    Baseline  met for back/hip 30% improvement, Lt knee still ongoing pain levels    Status  Partially Met        PT Long Term Goals - 05/04/19 0943      PT LONG TERM GOAL #1   Title  independent with advanced HEP    Baseline  compliant 6 days/week, working on more dynamic exercise and standing core transition    Time  7    Period  Weeks    Status  On-going    Target Date  06/22/19      PT LONG TERM GOAL #2   Title  The patient will be able to walk 8-12 min with 50% less pain    Baseline  using better transverse plane motion with arm swing, 3 min walk test caused pain 3/10 for back and Lt knee    Time  7    Period  Weeks    Status  On-going    Target Date  06/22/19      PT LONG TERM GOAL #3   Title  The patient will have improved lumbo/pelvic/shoulder/periscapular core strength grossly 4+/5 needed for walking and standing longer periods of time    Baseline  Pt with strength ranging from 4- to 4+/5 in hip abd bil, hip flexors, knee extensors, hip rotators and trunk flexors/core, needs cueing for dynamic functional form    Time  7    Period  Weeks    Status  New    Target Date  06/22/19      PT LONG TERM GOAL #4   Title  The patient will have improved bil hip abduction/gluteal strength to 4/5 needed for standing >5 minutes    Baseline  hip strength 4-/5 to 4+/5    Time  7    Period  Weeks    Status  On-going    Target Date  06/22/19      PT LONG TERM GOAL #5   Title  FOTO functional outcome score improved from 60% limitation to 51%    Baseline  47% 05/04/19    Status  Achieved            Plan - 05/08/19 1117    Clinical Impression Statement  Pt arrived without pain in lumbar region and only a 1/10 in Lt knee today.  She has soreness in bil gluteals likely from addition of resistance clamshell ther ex last week.   Pain went up to 2-3/10 for lumbar and knee with 4' walk test.  Stretching helped resolve pain to baseline again.  PT emphasized continuing to be active within window of lower grade pain with use of HEP as tools to manage symptoms as they arise.  PT continues to monitor posture in all positions with cueing as needed for alignment.  PT continues to focus on core progression with overlay of UE/LE strength.  She does better with TrA recruitment with "blow out a candle" start before LE ther ex.  She needed form cueing for sit to stand with finding good seated and standing posture with each rep.  She will continue to benefit from careful progression along POC.    Comorbidities  Lumbar fusion;  left TKR with poor result; HTN; diabetes; asthma; osteopenia; restless legs    Rehab Potential  Good    PT  Frequency  2x / week    PT Duration  6 weeks    PT Treatment/Interventions  ADLs/Self Care Home Management;Therapeutic exercise;Therapeutic activities;Balance training;Neuromuscular re-education;Patient/family education;Dry needling;Taping;Moist Heat;Cryotherapy;Electrical Stimulation;Ultrasound;Manual techniques;Traction    PT Next Visit Plan  review sit to stand with finding optimal standing and sitting posture between reps, continue core with "blow out candle" cue, progress Lt knee strength as tol    PT Home Exercise Plan  Access Code: [email or web address]    Consulted and Agree with Plan of Care  Patient       Patient will benefit from skilled therapeutic intervention in order to improve the following deficits and impairments:  Difficulty walking, Increased fascial restricitons, Increased muscle spasms, Impaired UE functional use, Pain, Impaired perceived functional ability, Decreased activity tolerance, Impaired flexibility, Decreased strength  Visit Diagnosis: Chronic bilateral low back pain without sciatica  Muscle weakness (generalized)  Chronic pain of left knee  Difficulty in walking, not elsewhere  classified  Pain in right hip  Pain in left hip  Other muscle spasm     Problem List Patient Active Problem List   Diagnosis Date Noted  . Carotid arterial disease ([place]) 01/23/2019  . RBBB (right bundle branch block with left anterior fascicular block)   . Chest pain 01/22/2019  . Weakness of both lower extremities 01/02/2019  . Frequent falls 01/02/2019  . Cellulitis of right elbow 12/29/2018  . OSA on CPAP 06/15/2017  . Diabetes ([place]) 03/02/2017  . Herpes zoster without complication 99/83/3825  . Prediabetes 02/23/2017  . Shortness of breath on exertion 02/23/2017  . B12 nutritional deficiency 02/23/2017  . Vitamin D deficiency 12/08/2016  . Tubular adenoma of colon 04/29/2016  . Essential hypertension 09/12/2015  . Allergic rhinitis 07/17/2015  . Insomnia 07/17/2015  . Obese 04/10/2015  . S/P left TKA 04/09/2015  . Osteopenia 12/04/2012  . GERD (gastroesophageal reflux disease) 02/07/2012  . 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 RESTLESS LEG SYNDROME 02/11/2009  . LEG EDEMA, BILATERAL 02/11/2009  . Hyperlipidemia 10/30/2008  . Urinary incontinence 10/30/2008  . Skin cancer 10/30/2008  . Asthma 11/21/2007  . Venous (peripheral) insufficiency 11/22/2006    Baruch Merl, PT 05/08/19 11:45 AM   [facility] [address], [place], [place], [address] Phone: [phone]   Fax:  [phone]  Name: Erin Roach MRN: [id number] Date of Birth: 03/09/1941

## 2019-05-16 ENCOUNTER — Ambulatory Visit: Payer: Medicare PPO | Attending: Internal Medicine | Admitting: Physical Therapy

## 2019-05-16 ENCOUNTER — Encounter: Payer: Self-pay | Admitting: Physical Therapy

## 2019-05-16 ENCOUNTER — Encounter: Payer: Self-pay | Admitting: Internal Medicine

## 2019-05-16 ENCOUNTER — Other Ambulatory Visit: Payer: Self-pay

## 2019-05-16 DIAGNOSIS — M6281 Muscle weakness (generalized): Secondary | ICD-10-CM | POA: Diagnosis not present

## 2019-05-16 DIAGNOSIS — G8929 Other chronic pain: Secondary | ICD-10-CM | POA: Diagnosis not present

## 2019-05-16 DIAGNOSIS — M25552 Pain in left hip: Secondary | ICD-10-CM | POA: Insufficient documentation

## 2019-05-16 DIAGNOSIS — M25562 Pain in left knee: Secondary | ICD-10-CM | POA: Diagnosis not present

## 2019-05-16 DIAGNOSIS — R262 Difficulty in walking, not elsewhere classified: Secondary | ICD-10-CM | POA: Insufficient documentation

## 2019-05-16 DIAGNOSIS — M62838 Other muscle spasm: Secondary | ICD-10-CM | POA: Diagnosis not present

## 2019-05-16 DIAGNOSIS — M25551 Pain in right hip: Secondary | ICD-10-CM

## 2019-05-16 DIAGNOSIS — M545 Low back pain, unspecified: Secondary | ICD-10-CM

## 2019-05-16 NOTE — Therapy (Signed)
[facility] [address] [place], [place], [address] Phone: [phone]   Fax:  [phone]  Physical Therapy Treatment  Patient Details  Name: Erin Roach MRN: [id number] Date of Birth: [birth year] Referring Provider (PT): Dr. Billey Gosling   Encounter Date: 05/16/2019  PT End of Session - 05/16/19 1609    Visit Number  15    Date for PT Re-Evaluation  06/15/19    Authorization Type  UHC Medicare    PT Start Time  0177    PT Stop Time  1655    PT Time Calculation (min)  45 min    Activity Tolerance  Patient tolerated treatment well;No increased pain    Behavior During Therapy  WFL for tasks assessed/performed       Past Medical History:  Diagnosis Date  . Allergic rhinitis   . Apnea   . Arthritis   . Asthma    very mild  . Back pain   . Bilateral lower extremity edema   . Carotid artery disease ([place])    a. Carotid duplex 03/2016 - duplex was stable, 93-90% RICA, 3-00% LICA  . Complication of anesthesia    post-op delirium  . Endometrial polyp   . Essential hypertension   . GERD (gastroesophageal reflux disease)   . Heart murmur    per pt  . History of adenomatous polyp of colon    04-24-2016  tubular adenoma  . History of squamous cell carcinoma excision    pilonidal area second excision 02-02-2002  . HTN (hypertension)   . Hyperlipidemia   . Leg pain   . OAB (overactive bladder)   . RA (rheumatoid arthritis) ([place])   . RBBB (right bundle branch block with left anterior fascicular block)   . RLS (restless legs syndrome)   . Shortness of breath on exertion   . SUI (stress urinary incontinence, female)   . Venous insufficiency    legs  . Vitamin D deficiency     Past Surgical History:  Procedure Laterality Date  . APPENDECTOMY  07-02-2005   [name]   open  . CARDIAC CATHETERIZATION  01-23-2009  dr Darnell Level brodie   normal coronary arteries and lvf  . CARDIOVASCULAR STRESS TEST   03/31/2016   Low risk nuclear study w/ medium defect of mild severity in basal anterior and mid anterior location w/ no evidence ischemia or infarction/  normal LV function and wall motion,  nuclear stress ef 71%  . CATARACT EXTRACTION W/ INTRAOCULAR LENS IMPLANT Left 09/2016  . COLONOSCOPY  last one 04-24-2016  . HYSTEROSCOPY WITH D & C N/A 12/22/2016   Procedure: DILATATION AND CURETTAGE /HYSTEROSCOPY;  Surgeon: Dian Queen, MD;  Location: [facility];  Service: Gynecology;  Laterality: N/A;  . IR US GUIDANCE  09/08/2016  . POSTERIOR LUMBAR FUSION  02/ 2018    [facility] ([place], [place])  . SKIN CANCER EXCISION    . TONSILLECTOMY  child  . 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 TRANSTHORACIC ECHOCARDIOGRAM  01/21/2009   mild LVH, ef 92-33%, grade 1 diastolic dysfunction  . ULNAR NERVE TRANSPOSITION Left 12-20-2008  dr sypher   decompression and partial resection medial triceps fascia  . WIDE LOCAL EXCISION PILONIDAL AREA  02-02-2002  [name]   recurrent squamous cell carcinoma in situ    There were no  vitals filed for this visit.  Subjective Assessment - 05/16/19 1608    Subjective  I typed up all of my exercises and want you to look them over to see if I'm missing anything.  Two nights ago I got a very bad pain in Rt hamstring.  It took 2 pain pills to make it better.  The Rt calf is tight too.    Pertinent History  "Erin Roach";  left TKR;  lumbar fusion; restless legs, HTN; diabetes;  mild neuropathy;  osteopenia    Limitations  House hold activities    How long can you stand comfortably?  6-8 min    How long can you walk comfortably?  10% improvement in ability to be on feet/active throughout day    Diagnostic tests  none    Patient Stated Goals  I would like to feel better, be more active; better quality of life    Currently  in Pain?  Yes    Pain Score  2     Pain Location  Buttocks    Pain Orientation  Right    Pain Descriptors / Indicators  Aching    Pain Type  Chronic pain    Pain Onset  More than a month ago    Pain Frequency  Intermittent    Aggravating Factors   standing, walking                       [facility] Adult PT Treatment/Exercise - 05/16/19 0001      Exercises   Exercises  Lumbar      Lumbar Exercises: Stretches   Active Hamstring Stretch  Right;1 rep;30 seconds    Active Hamstring Stretch Limitations  seated, after DN      Lumbar Exercises: Aerobic   Nustep  L2 x 8', PT present to discuss symptoms      Manual Therapy   Soft tissue mobilization  Rt lateral hamstring, piriformis, lateral gastroc after DN       Trigger Point Dry Needling - 05/16/19 0001    Consent Given?  Yes    Education Handout Provided  Previously provided    Other Dry Needling  Rt side only    Hamstring Response  Twitch response elicited;Palpable increased muscle length   lateral   Gastrocnemius Response  Twitch response elicited;Palpable increased muscle length   lateral   Piriformis Response  Twitch response elicited;Palpable increased muscle length             PT Short Term Goals - 04/17/19 1107      PT SHORT TERM GOAL #1   Title  independent with initial HEP for mobility and strengthening    Status  Achieved      PT SHORT TERM GOAL #2   Title  The patient will have improved lumbar flexion to 60 degrees and HS muscle lengths to 70 degrees needed for improved household and community mobility    Status  Achieved      PT SHORT TERM GOAL #3   Title  The patient will be able to stand for 3-4 minutes for washing dishes at the sink    Status  Achieved      PT SHORT TERM GOAL #4   Title  The patient will report back, hip, knee and right upper arm pain about 30% improved with daily activities    Baseline  met for back/hip 30% improvement, Lt knee still ongoing pain levels    Status   Partially  Met        PT Long Term Goals - 05/04/19 0943      PT LONG TERM GOAL #1   Title  independent with advanced HEP    Baseline  compliant 6 days/week, working on more dynamic exercise and standing core transition    Time  7    Period  Weeks    Status  On-going    Target Date  06/22/19      PT LONG TERM GOAL #2   Title  The patient will be able to walk 8-12 min with 50% less pain    Baseline  using better transverse plane motion with arm swing, 3 min walk test caused pain 3/10 for back and Lt knee    Time  7    Period  Weeks    Status  On-going    Target Date  06/22/19      PT LONG TERM GOAL #3   Title  The patient will have improved lumbo/pelvic/shoulder/periscapular core strength grossly 4+/5 needed for walking and standing longer periods of time    Baseline  Pt with strength ranging from 4- to 4+/5 in hip abd bil, hip flexors, knee extensors, hip rotators and trunk flexors/core, needs cueing for dynamic functional form    Time  7    Period  Weeks    Status  New    Target Date  06/22/19      PT LONG TERM GOAL #4   Title  The patient will have improved bil hip abduction/gluteal strength to 4/5 needed for standing >5 minutes    Baseline  hip strength 4-/5 to 4+/5    Time  7    Period  Weeks    Status  On-going    Target Date  06/22/19      PT LONG TERM GOAL #5   Title  FOTO functional outcome score improved from 60% limitation to 51%    Baseline  47% 05/04/19    Status  Achieved            Plan - 05/16/19 1701    Clinical Impression Statement  Pt reported signif Rt hamstring pain 2 nights ago that required 2 pain pills and ongoing tightness in Rt calf.  PT revisited DN for hip, posterior thigh and posterior calf with some twitch and release noted in lateral hamstring and gastroc.  Pt expressed a need to review her HEP which she consolidated into a typed list which time did not allow for today so PT will do this next visit.  Pt will likely benefit from a  simplified HEP with 3-4 grouped strengthening exercises alongside stretches that she cycles through every day or every other day for consistency and confidence in knowing what to do with more independence.  Continue PT along POC with need to revisit past ther ex with cueing for form as needed.    Comorbidities  Lumbar fusion;  left TKR with poor result; HTN; diabetes; asthma; osteopenia; restless legs    Rehab Potential  Good    PT Frequency  2x / week    PT Duration  6 weeks    PT Treatment/Interventions  ADLs/Self Care Home Management;Therapeutic exercise;Therapeutic activities;Balance training;Neuromuscular re-education;Patient/family education;Dry needling;Taping;Moist Heat;Cryotherapy;Electrical Stimulation;Ultrasound;Manual techniques;Traction    PT Next Visit Plan  NuStep, choose 3-4 strength exercises to do 10 reps of each (i.e. sit to stand, seated green band march, supine hip abd with bridge, heel slide with core) as example of how Pt should approach  HEP    PT Home Exercise Plan  Access Code: [email or web address]    Consulted and Agree with Plan of Care  Patient       Patient will benefit from skilled therapeutic intervention in order to improve the following deficits and impairments:     Visit Diagnosis: Chronic bilateral low back pain without sciatica  Muscle weakness (generalized)  Chronic pain of left knee  Difficulty in walking, not elsewhere classified  Pain in right hip  Pain in left hip  Other muscle spasm     Problem List Patient Active Problem List   Diagnosis Date Noted  . Carotid arterial disease ([place]) 01/23/2019  . RBBB (right bundle branch block with left anterior fascicular block)   . Chest pain 01/22/2019  . Weakness of both lower extremities 01/02/2019  . Frequent falls 01/02/2019  . Cellulitis of right elbow 12/29/2018  . OSA on CPAP 06/15/2017  . Diabetes ([place]) 03/02/2017  . Herpes zoster without complication 12/11/2334  . Prediabetes 02/23/2017  .  Shortness of breath on exertion 02/23/2017  . B12 nutritional deficiency 02/23/2017  . Vitamin D deficiency 12/08/2016  . Tubular adenoma of colon 04/29/2016  . Essential hypertension 09/12/2015  . Allergic rhinitis 07/17/2015  . Insomnia 07/17/2015  . Obese 04/10/2015  . S/P left TKA 04/09/2015  . Osteopenia 12/04/2012  . GERD (gastroesophageal reflux disease) 02/07/2012  . 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 RESTLESS LEG SYNDROME 02/11/2009  . LEG EDEMA, BILATERAL 02/11/2009  . Hyperlipidemia 10/30/2008  . Urinary incontinence 10/30/2008  . Skin cancer 10/30/2008  . Asthma 11/21/2007  . Venous (peripheral) insufficiency 11/22/2006    Baruch Merl, PT 05/16/19 5:16 PM   [facility] [address] [place], [place], [address] Phone: [phone]   Fax:  [phone]  Name: Erin Roach MRN: [id number] Date of Birth: 21-Oct-1940

## 2019-05-17 ENCOUNTER — Encounter: Payer: Self-pay | Admitting: Internal Medicine

## 2019-05-17 DIAGNOSIS — Z961 Presence of intraocular lens: Secondary | ICD-10-CM | POA: Diagnosis not present

## 2019-05-17 DIAGNOSIS — H25811 Combined forms of age-related cataract, right eye: Secondary | ICD-10-CM | POA: Diagnosis not present

## 2019-05-17 DIAGNOSIS — H26492 Other secondary cataract, left eye: Secondary | ICD-10-CM | POA: Diagnosis not present

## 2019-05-17 LAB — HM DIABETES EYE EXAM

## 2019-05-18 ENCOUNTER — Ambulatory Visit: Payer: Medicare PPO | Admitting: Physical Therapy

## 2019-05-18 ENCOUNTER — Other Ambulatory Visit: Payer: Self-pay

## 2019-05-18 DIAGNOSIS — M25562 Pain in left knee: Secondary | ICD-10-CM | POA: Diagnosis not present

## 2019-05-18 DIAGNOSIS — G8929 Other chronic pain: Secondary | ICD-10-CM | POA: Diagnosis not present

## 2019-05-18 DIAGNOSIS — R262 Difficulty in walking, not elsewhere classified: Secondary | ICD-10-CM | POA: Diagnosis not present

## 2019-05-18 DIAGNOSIS — M25552 Pain in left hip: Secondary | ICD-10-CM | POA: Diagnosis not present

## 2019-05-18 DIAGNOSIS — M25551 Pain in right hip: Secondary | ICD-10-CM | POA: Diagnosis not present

## 2019-05-18 DIAGNOSIS — I872 Venous insufficiency (chronic) (peripheral): Secondary | ICD-10-CM | POA: Diagnosis not present

## 2019-05-18 DIAGNOSIS — M6281 Muscle weakness (generalized): Secondary | ICD-10-CM

## 2019-05-18 DIAGNOSIS — M545 Low back pain, unspecified: Secondary | ICD-10-CM

## 2019-05-18 DIAGNOSIS — G609 Hereditary and idiopathic neuropathy, unspecified: Secondary | ICD-10-CM | POA: Diagnosis not present

## 2019-05-18 DIAGNOSIS — B351 Tinea unguium: Secondary | ICD-10-CM | POA: Diagnosis not present

## 2019-05-18 DIAGNOSIS — M62838 Other muscle spasm: Secondary | ICD-10-CM | POA: Diagnosis not present

## 2019-05-18 NOTE — Therapy (Signed)
[facility] [address] [place], [place], [address] Phone: [phone]   Fax:  [phone]  Physical Therapy Treatment  Patient Details  Name: Erin Roach MRN: [id number] Date of Birth: 03-13-1941 Referring Provider (PT): Dr. Billey Gosling   Encounter Date: 05/18/2019  PT End of Session - 05/18/19 1404    Visit Number  16    Date for PT Re-Evaluation  06/15/19    Authorization Type  [employer] Medicare    PT Start Time  1281    PT Stop Time  1309    PT Time Calculation (min)  39 min    Activity Tolerance  Patient tolerated treatment well;No increased pain       Past Medical History:  Diagnosis Date  . Allergic rhinitis   . Apnea   . Arthritis   . Asthma    very mild  . Back pain   . Bilateral lower extremity edema   . Carotid artery disease ([place])    a. Carotid duplex 03/2016 - duplex was stable, 18-86% RICA, 7-73% LICA  . Complication of anesthesia    post-op delirium  . Endometrial polyp   . Essential hypertension   . GERD (gastroesophageal reflux disease)   . Heart murmur    per pt  . History of adenomatous polyp of colon    04-24-2016  tubular adenoma  . History of squamous cell carcinoma excision    pilonidal area second excision 02-02-2002  . HTN (hypertension)   . Hyperlipidemia   . Leg pain   . OAB (overactive bladder)   . RA (rheumatoid arthritis) ([place])   . RBBB (right bundle branch block with left anterior fascicular block)   . RLS (restless legs syndrome)   . Shortness of breath on exertion   . SUI (stress urinary incontinence, female)   . Venous insufficiency    legs  . Vitamin D deficiency     Past Surgical History:  Procedure Laterality Date  . APPENDECTOMY  07-02-2005   [name]   open  . CARDIAC CATHETERIZATION  01-23-2009  dr Darnell Level brodie   normal coronary arteries and lvf  . CARDIOVASCULAR STRESS TEST  03/31/2016   Low risk nuclear study w/ medium defect of mild severity  in basal anterior and mid anterior location w/ no evidence ischemia or infarction/  normal LV function and wall motion,  nuclear stress ef 71%  . CATARACT EXTRACTION W/ INTRAOCULAR LENS IMPLANT Left 09/2016  . COLONOSCOPY  last one 04-24-2016  . HYSTEROSCOPY WITH D & C N/A 12/22/2016   Procedure: DILATATION AND CURETTAGE /HYSTEROSCOPY;  Surgeon: Dian Queen, MD;  Location: [facility];  Service: Gynecology;  Laterality: N/A;  . IR US GUIDANCE  09/08/2016  . POSTERIOR LUMBAR FUSION  02/ 2018    [facility] ([place])  . SKIN CANCER EXCISION    . TONSILLECTOMY  child  . 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 TRANSTHORACIC ECHOCARDIOGRAM  01/21/2009   mild LVH, ef 73-66%, grade 1 diastolic dysfunction  . ULNAR NERVE TRANSPOSITION Left 12-20-2008  dr sypher   decompression and partial resection medial triceps fascia  . WIDE LOCAL EXCISION PILONIDAL AREA  02-02-2002  [name]   recurrent squamous cell carcinoma in situ    There were no vitals filed for this visit.  Subjective Assessment - 05/18/19 1256  Subjective  I brought my list of exercises for you to review.  Getting down on the floor bothers me.    Pertinent History  "Erin Roach";  left TKR;  lumbar fusion; restless legs, HTN; diabetes;  mild neuropathy;  osteopenia                       [facility] Adult PT Treatment/Exercise - 05/18/19 0001      Lumbar Exercises: Aerobic   Nustep  L2 x 12', PT present to discuss HEP       Lumbar Exercises: Standing   Row  Strengthening;10 reps;Theraband    Theraband Level (Row)  Level 3 (Green)    Shoulder Extension  Strengthening;10 reps;Theraband    Theraband Level (Shoulder Extension)  Level 3 (Green)      Lumbar Exercises: Seated   Sit to Stand  10 reps    Sit to Stand Limitations  2 sets; 3rd set with green band  around thighs     Other Seated Lumbar Exercises  green band around thighs march 2x 10       Lumbar Exercises: Supine   Heel Slides  10 reps    Heel Slides Limitations  2 sets     Bridge  10 reps    Bridge Limitations  2 sets with green band around thighs                PT Short Term Goals - 04/17/19 1107      PT SHORT TERM GOAL #1   Title  independent with initial HEP for mobility and strengthening    Status  Achieved      PT SHORT TERM GOAL #2   Title  The patient will have improved lumbar flexion to 60 degrees and HS muscle lengths to 70 degrees needed for improved household and community mobility    Status  Achieved      PT SHORT TERM GOAL #3   Title  The patient will be able to stand for 3-4 minutes for washing dishes at the sink    Status  Achieved      PT SHORT TERM GOAL #4   Title  The patient will report back, hip, knee and right upper arm pain about 30% improved with daily activities    Baseline  met for back/hip 30% improvement, Lt knee still ongoing pain levels    Status  Partially Met        PT Long Term Goals - 05/04/19 0943      PT LONG TERM GOAL #1   Title  independent with advanced HEP    Baseline  compliant 6 days/week, working on more dynamic exercise and standing core transition    Time  7    Period  Weeks    Status  On-going    Target Date  06/22/19      PT LONG TERM GOAL #2   Title  The patient will be able to walk 8-12 min with 50% less pain    Baseline  using better transverse plane motion with arm swing, 3 min walk test caused pain 3/10 for back and Lt knee    Time  7    Period  Weeks    Status  On-going    Target Date  06/22/19      PT LONG TERM GOAL #3   Title  The patient will have improved lumbo/pelvic/shoulder/periscapular core strength grossly 4+/5 needed for walking and standing longer periods of time  Baseline  Pt with strength ranging from 4- to 4+/5 in hip abd bil, hip flexors, knee extensors, hip rotators and trunk  flexors/core, needs cueing for dynamic functional form    Time  7    Period  Weeks    Status  New    Target Date  06/22/19      PT LONG TERM GOAL #4   Title  The patient will have improved bil hip abduction/gluteal strength to 4/5 needed for standing >5 minutes    Baseline  hip strength 4-/5 to 4+/5    Time  7    Period  Weeks    Status  On-going    Target Date  06/22/19      PT LONG TERM GOAL #5   Title  FOTO functional outcome score improved from 60% limitation to 51%    Baseline  47% 05/04/19    Status  Achieved            Plan - 05/18/19 1405    Clinical Impression Statement  Treatment session focus on streamlining HEP to avoid frustration and promote consistency with performance.  She responded well to 2 sets of 4 strengthening ex's which did not exacerbate her pain.  She has a negative perception of lying on the floor to exercise so I recommended she do them on the bed instead.  Minimal cues needed for transverse abdominus activation.  Therapist closely monitoring response with all treatment interventions.    Comorbidities  Lumbar fusion;  left TKR with poor result; HTN; diabetes; asthma; osteopenia; restless legs    Examination-Participation Restrictions  Community Activity;Other;Shop;Meal Prep    Rehab Potential  Good    PT Frequency  2x / week    PT Duration  6 weeks    PT Treatment/Interventions  ADLs/Self Care Home Management;Therapeutic exercise;Therapeutic activities;Balance training;Neuromuscular re-education;Patient/family education;Dry needling;Taping;Moist Heat;Cryotherapy;Electrical Stimulation;Ultrasound;Manual techniques;Traction    PT Next Visit Plan  Review HEP as needed to promote independence for discharge    PT Home Exercise Plan  Access Code: [email or web address]       Patient will benefit from skilled therapeutic intervention in order to improve the following deficits and impairments:  Difficulty walking, Increased fascial restricitons, Increased muscle spasms,  Impaired UE functional use, Pain, Impaired perceived functional ability, Decreased activity tolerance, Impaired flexibility, Decreased strength  Visit Diagnosis: Chronic bilateral low back pain without sciatica  Muscle weakness (generalized)     Problem List Patient Active Problem List   Diagnosis Date Noted  . Carotid arterial disease ([place]) 01/23/2019  . RBBB (right bundle branch block with left anterior fascicular block)   . Chest pain 01/22/2019  . Weakness of both lower extremities 01/02/2019  . Frequent falls 01/02/2019  . Cellulitis of right elbow 12/29/2018  . OSA on CPAP 06/15/2017  . Diabetes ([place]) 03/02/2017  . Herpes zoster without complication 81/85/6314  . Prediabetes 02/23/2017  . Shortness of breath on exertion 02/23/2017  . B12 nutritional deficiency 02/23/2017  . Vitamin D deficiency 12/08/2016  . Tubular adenoma of colon 04/29/2016  . Essential hypertension 09/12/2015  . Allergic rhinitis 07/17/2015  . Insomnia 07/17/2015  . Obese 04/10/2015  . S/P left TKA 04/09/2015  . Osteopenia 12/04/2012  . GERD (gastroesophageal reflux disease) 02/07/2012  . 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 RESTLESS LEG SYNDROME 02/11/2009  . LEG EDEMA, BILATERAL 02/11/2009  . Hyperlipidemia 10/30/2008  . Urinary incontinence 10/30/2008  . Skin cancer 10/30/2008  . Asthma 11/21/2007  . Venous (peripheral) insufficiency 11/22/2006   Ruben Im,  PT 05/18/19 2:12 PM Phone: [phone] Fax: [phone]  Alvera Singh 05/18/2019, 2:11 PM  [facility] [address], [place], [place], [address] Phone: [phone]   Fax:  [phone]  Name: Erin Roach MRN: [id number] Date of Birth: 30-Apr-1941

## 2019-05-22 ENCOUNTER — Ambulatory Visit: Payer: Medicare PPO | Admitting: Physical Therapy

## 2019-05-22 ENCOUNTER — Other Ambulatory Visit: Payer: Self-pay

## 2019-05-22 ENCOUNTER — Encounter: Payer: Self-pay | Admitting: Physical Therapy

## 2019-05-22 DIAGNOSIS — M25552 Pain in left hip: Secondary | ICD-10-CM | POA: Diagnosis not present

## 2019-05-22 DIAGNOSIS — G8929 Other chronic pain: Secondary | ICD-10-CM | POA: Diagnosis not present

## 2019-05-22 DIAGNOSIS — M62838 Other muscle spasm: Secondary | ICD-10-CM | POA: Diagnosis not present

## 2019-05-22 DIAGNOSIS — R262 Difficulty in walking, not elsewhere classified: Secondary | ICD-10-CM | POA: Diagnosis not present

## 2019-05-22 DIAGNOSIS — M25551 Pain in right hip: Secondary | ICD-10-CM

## 2019-05-22 DIAGNOSIS — M545 Low back pain, unspecified: Secondary | ICD-10-CM

## 2019-05-22 DIAGNOSIS — M6281 Muscle weakness (generalized): Secondary | ICD-10-CM | POA: Diagnosis not present

## 2019-05-22 DIAGNOSIS — M25562 Pain in left knee: Secondary | ICD-10-CM | POA: Diagnosis not present

## 2019-05-22 NOTE — Patient Instructions (Signed)
Access Code: [email or web address]  URL: [email or web address]  Date: 05/22/2019  Prepared by: Venetia Night Priseis Cratty   Exercises  Supine Piriformis Stretch with Foot on Ground - 3 reps - 1 sets - 30 hold - 1x daily - 7x weekly  Hooklying Hamstring Stretch with Strap - 3 reps - 1 sets - 30 hold - 1x daily - 7x weekly  Straight Leg Raise - 10 reps - 3 sets - 1x daily - 7x weekly  Seated Hamstring Stretch - 3 reps - 1 sets - 30 hold - 1x daily - 7x weekly  Sidelying Transversus Abdominis Bracing - 10 reps - 2 sets - 10 hold - 1x daily - 7x weekly  Clamshell - 10 reps - 2 sets - 1x daily - 7x weekly  Quadriceps Stretch with Chair - 2 reps - 1 sets - 30 hold - 1x daily - 7x weekly  Supine Lower Trunk Rotation - 5 reps - 1 sets - 15 hold - 1x daily - 7x weekly  Hooklying Single Knee to Chest - 5 reps - 1 sets - 15 hold - 1x daily - 7x weekly  Supine Double Knee to Chest - 5 reps - 1 sets - 15 hold - 1x daily - 7x weekly  Bridge with Hip Abduction and Resistance - 10 reps - 2 sets - 1x daily - 7x weekly  Bent Knee Fallouts - 10 reps - 2 sets - 1x daily - 7x weekly  Supine March - 10 reps - 2 sets - 1x daily - 7x weekly  Supine Heel Slides - 10 reps - 2 sets - 1x daily - 7x weekly  Standing Shoulder Extension with Resistance - 10 reps - 2 sets - 5 hold - 1x daily - 7x weekly  Standing Hip Extension with Counter Support - 10 reps - 2 sets - 1x daily - 7x weekly  Seated Flexion Stretch with Swiss Ball - 10 reps - 3 sets - 1x daily - 7x weekly  Seated Thoracic Flexion and Rotation with [employer] - 10 reps - 3 sets - 1x daily - 7x weekly  Seated March with Resistance - 10 reps - 3 sets - 1x daily - 7x weekly  Seated Hip Abduction with Resistance - 15 reps - 2 sets - 1x daily - 7x weekly  Seated Long Arc Quad - 10 reps - 3 sets - 1x daily - 7x weekly  Sit to Stand with Arms Crossed - 10 reps - 3 sets - 1x daily - 7x weekly  Gastroc Stretch with Foot at Wall - 3 reps - 1 sets - 30 hold - 1x daily - 7x  weekly

## 2019-05-22 NOTE — Therapy (Signed)
[facility] [address] [place], [place], [address] Phone: [phone]   Fax:  [phone]  Physical Therapy Treatment  Patient Details  Name: Erin Roach MRN: [id number] Date of Birth: 1941/01/09 Referring Provider (PT): Dr. Billey Gosling   Encounter Date: 05/22/2019  PT End of Session - 05/22/19 1018    Visit Number  17    Date for PT Re-Evaluation  06/15/19    Authorization Type  UHC Medicare    PT Start Time  7867    PT Stop Time  1056    PT Time Calculation (min)  41 min    Activity Tolerance  Patient tolerated treatment well;No increased pain    Behavior During Therapy  WFL for tasks assessed/performed       Past Medical History:  Diagnosis Date  . Allergic rhinitis   . Apnea   . Arthritis   . Asthma    very mild  . Back pain   . Bilateral lower extremity edema   . Carotid artery disease ([place])    a. Carotid duplex 03/2016 - duplex was stable, 54-49% RICA, 2-01% LICA  . Complication of anesthesia    post-op delirium  . Endometrial polyp   . Essential hypertension   . GERD (gastroesophageal reflux disease)   . Heart murmur    per pt  . History of adenomatous polyp of colon    04-24-2016  tubular adenoma  . History of squamous cell carcinoma excision    pilonidal area second excision 02-02-2002  . HTN (hypertension)   . Hyperlipidemia   . Leg pain   . OAB (overactive bladder)   . RA (rheumatoid arthritis) ([place])   . RBBB (right bundle branch block with left anterior fascicular block)   . RLS (restless legs syndrome)   . Shortness of breath on exertion   . SUI (stress urinary incontinence, female)   . Venous insufficiency    legs  . Vitamin D deficiency     Past Surgical History:  Procedure Laterality Date  . APPENDECTOMY  07-02-2005   [name]   open  . CARDIAC CATHETERIZATION  01-23-2009  dr Darnell Level brodie   normal coronary arteries and lvf  . CARDIOVASCULAR STRESS TEST   03/31/2016   Low risk nuclear study w/ medium defect of mild severity in basal anterior and mid anterior location w/ no evidence ischemia or infarction/  normal LV function and wall motion,  nuclear stress ef 71%  . CATARACT EXTRACTION W/ INTRAOCULAR LENS IMPLANT Left 09/2016  . COLONOSCOPY  last one 04-24-2016  . HYSTEROSCOPY WITH D & C N/A 12/22/2016   Procedure: DILATATION AND CURETTAGE /HYSTEROSCOPY;  Surgeon: Dian Queen, MD;  Location: [facility];  Service: Gynecology;  Laterality: N/A;  . IR US GUIDANCE  09/08/2016  . POSTERIOR LUMBAR FUSION  02/ 2018    [facility] ([place])  . SKIN CANCER EXCISION    . TONSILLECTOMY  child  . 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 TRANSTHORACIC ECHOCARDIOGRAM  01/21/2009   mild LVH, ef 00-71%, grade 1 diastolic dysfunction  . ULNAR NERVE TRANSPOSITION Left 12-20-2008  dr sypher   decompression and partial resection medial triceps fascia  . WIDE LOCAL EXCISION PILONIDAL AREA  02-02-2002  [name]   recurrent squamous cell carcinoma in situ    There were no  vitals filed for this visit.  Subjective Assessment - 05/22/19 1016    Subjective  I had a near fall about 45 min ago.  I just feel a little woozy and may need to do everything seated or lying down today as I get my bearings.  My knees buckled for some reason.    Pertinent History  "Erin Roach";  left TKR;  lumbar fusion; restless legs, HTN; diabetes;  mild neuropathy;  osteopenia    Limitations  House hold activities    How long can you stand comfortably?  6-8 min    How long can you walk comfortably?  10% improvement in ability to be on feet/active throughout day    Diagnostic tests  none    Patient Stated Goals  I would like to feel better, be more active; better quality of life    Currently in Pain?  Yes    Pain Score  1      Pain Location  Knee    Pain Orientation  Right;Left    Pain Descriptors / Indicators  Aching    Pain Type  Chronic pain    Pain Onset  More than a month ago    Pain Frequency  Intermittent                       [facility] Adult PT Treatment/Exercise - 05/22/19 0001      Exercises   Exercises  Lumbar;Knee/Hip      Lumbar Exercises: Stretches   Lower Trunk Rotation  5 reps;10 seconds    Lower Trunk Rotation Limitations  and A/ROM x 10 each way    Piriformis Stretch  2 reps;20 seconds      Lumbar Exercises: Aerobic   Nustep  L2 x 8', PT present to monitor symptoms and discuss progress with HEP      Lumbar Exercises: Supine   Bridge  10 reps;2 seconds    Bridge Limitations  2 sets with hip abd green band around knees      Knee/Hip Exercises: Seated   Long Arc Quad  AROM;4 sets;5 reps    Clamshell with TheraBand  Green   2x10   Marching  Strengthening;2 sets;20 reps    Marching Limitations  green band, PT cued slower for increased awareness of recruitment    Sit to [employer]  10 reps   PT cued neutral posture on stand and hip hinge on stand to s     Knee/Hip Exercises: Supine   Straight Leg Raises  AROM;Strengthening;2 sets;5 sets      Knee/Hip Exercises: Sidelying   Clams  2x10 bil green band with TrA             PT Education - 05/22/19 1055    Education Details  Access Code: [email or web address]    Person(s) Educated  Patient    Methods  Explanation;Demonstration;Verbal cues;Handout    Comprehension  Verbalized understanding;Returned demonstration       PT Short Term Goals - 04/17/19 1107      PT SHORT TERM GOAL #1   Title  independent with initial HEP for mobility and strengthening    Status  Achieved      PT SHORT TERM GOAL #2   Title  The patient will have improved lumbar flexion to 60 degrees and HS muscle lengths to 70 degrees needed for improved household and community mobility    Status  Achieved      PT SHORT TERM GOAL #3   Title  The patient will be  able to stand for 3-4 minutes for washing dishes at the sink    Status  Achieved      PT SHORT TERM GOAL #4   Title  The patient will report back, hip, knee and right upper arm pain about 30% improved with daily activities    Baseline  met for back/hip 30% improvement, Lt knee still ongoing pain levels    Status  Partially Met        PT Long Term Goals - 05/04/19 0943      PT LONG TERM GOAL #1   Title  independent with advanced HEP    Baseline  compliant 6 days/week, working on more dynamic exercise and standing core transition    Time  7    Period  Weeks    Status  On-going    Target Date  06/22/19      PT LONG TERM GOAL #2   Title  The patient will be able to walk 8-12 min with 50% less pain    Baseline  using better transverse plane motion with arm swing, 3 min walk test caused pain 3/10 for back and Lt knee    Time  7    Period  Weeks    Status  On-going    Target Date  06/22/19      PT LONG TERM GOAL #3   Title  The patient will have improved lumbo/pelvic/shoulder/periscapular core strength grossly 4+/5 needed for walking and standing longer periods of time    Baseline  Pt with strength ranging from 4- to 4+/5 in hip abd bil, hip flexors, knee extensors, hip rotators and trunk flexors/core, needs cueing for dynamic functional form    Time  7    Period  Weeks    Status  New    Target Date  06/22/19      PT LONG TERM GOAL #4   Title  The patient will have improved bil hip abduction/gluteal strength to 4/5 needed for standing >5 minutes    Baseline  hip strength 4-/5 to 4+/5    Time  7    Period  Weeks    Status  On-going    Target Date  06/22/19      PT LONG TERM GOAL #5   Title  FOTO functional outcome score improved from 60% limitation to 51%    Baseline  47% 05/04/19    Status  Achieved            Plan - 05/22/19 1022    Clinical Impression Statement  Pt arrived with report of a near fall in her kitchen approx 45 min before appointment.  She was shaken  and felt a little woozy so asked that we avoid standing ther ex today.  PT reviewed "formula" of HEP to continue encouraging compliance via daily stretches and 4 strengthening exericses per day.  We discussed idea of choosing all ther ex in one position for ease as needed (standing, sitting, SL, supine, etc).  PT added specific knee strength now that Pt's knee pain is improved to include LAQ and SLR which she performed without pain.  Pt is demonstrating improved awareness of finding neutral posture with sit to stand upon standing with min need for cueing on initial reps.  She will continue to benefit on working her towards a confidence and proper performance of HEP for support and strength of multiple chronic pain areas including bil knees and lumbar/Rt hip.  Comorbidities  Lumbar fusion;  left TKR with poor result; HTN; diabetes; asthma; osteopenia; restless legs    Stability/Clinical Decision Making  Evolving/Moderate complexity    Rehab Potential  Good    PT Frequency  2x / week    PT Duration  6 weeks    PT Treatment/Interventions  ADLs/Self Care Home Management;Therapeutic exercise;Therapeutic activities;Balance training;Neuromuscular re-education;Patient/family education;Dry needling;Taping;Moist Heat;Cryotherapy;Electrical Stimulation;Ultrasound;Manual techniques;Traction    PT Next Visit Plan  f/u on near fall from before last visit, continue building confidence for HEP, f/u on added LAQ and SLR for knee strength, review sit to stand posture/technique    PT Home Exercise Plan  Access Code: [email or web address]    Consulted and Agree with Plan of Care  Patient       Patient will benefit from skilled therapeutic intervention in order to improve the following deficits and impairments:  Difficulty walking, Increased fascial restricitons, Increased muscle spasms, Impaired UE functional use, Pain, Impaired perceived functional ability, Decreased activity tolerance, Impaired flexibility, Decreased  strength  Visit Diagnosis: Chronic bilateral low back pain without sciatica  Muscle weakness (generalized)  Chronic pain of left knee  Difficulty in walking, not elsewhere classified  Pain in right hip  Pain in left hip     Problem List Patient Active Problem List   Diagnosis Date Noted  . Carotid arterial disease ([place]) 01/23/2019  . RBBB (right bundle branch block with left anterior fascicular block)   . Chest pain 01/22/2019  . Weakness of both lower extremities 01/02/2019  . Frequent falls 01/02/2019  . Cellulitis of right elbow 12/29/2018  . OSA on CPAP 06/15/2017  . Diabetes ([place]) 03/02/2017  . Herpes zoster without complication 95/32/0233  . Prediabetes 02/23/2017  . Shortness of breath on exertion 02/23/2017  . B12 nutritional deficiency 02/23/2017  . Vitamin D deficiency 12/08/2016  . Tubular adenoma of colon 04/29/2016  . Essential hypertension 09/12/2015  . Allergic rhinitis 07/17/2015  . Insomnia 07/17/2015  . Obese 04/10/2015  . S/P left TKA 04/09/2015  . Osteopenia 12/04/2012  . GERD (gastroesophageal reflux disease) 02/07/2012  . 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 RESTLESS LEG SYNDROME 02/11/2009  . LEG EDEMA, BILATERAL 02/11/2009  . Hyperlipidemia 10/30/2008  . Urinary incontinence 10/30/2008  . Skin cancer 10/30/2008  . Asthma 11/21/2007  . Venous (peripheral) insufficiency 11/22/2006    Baruch Merl, PT 05/22/19 10:58 AM    [facility] [address] [place], [place], [address] Phone: [phone]   Fax:  [phone]  Name: Erin Roach MRN: [id number] Date of Birth: 09-07-1940

## 2019-05-24 ENCOUNTER — Encounter (INDEPENDENT_AMBULATORY_CARE_PROVIDER_SITE_OTHER): Payer: Self-pay | Admitting: Family Medicine

## 2019-05-24 ENCOUNTER — Other Ambulatory Visit: Payer: Self-pay

## 2019-05-24 ENCOUNTER — Ambulatory Visit: Payer: Medicare PPO | Admitting: Physical Therapy

## 2019-05-24 ENCOUNTER — Ambulatory Visit (INDEPENDENT_AMBULATORY_CARE_PROVIDER_SITE_OTHER): Payer: Medicare PPO | Admitting: Family Medicine

## 2019-05-24 ENCOUNTER — Encounter: Payer: Self-pay | Admitting: Physical Therapy

## 2019-05-24 VITALS — BP 137/80 | HR 109 | Temp 98.2°F | Ht 66.0 in | Wt 203.0 lb

## 2019-05-24 DIAGNOSIS — E119 Type 2 diabetes mellitus without complications: Secondary | ICD-10-CM | POA: Diagnosis not present

## 2019-05-24 DIAGNOSIS — Z6832 Body mass index (BMI) 32.0-32.9, adult: Secondary | ICD-10-CM

## 2019-05-24 DIAGNOSIS — E559 Vitamin D deficiency, unspecified: Secondary | ICD-10-CM

## 2019-05-24 DIAGNOSIS — R262 Difficulty in walking, not elsewhere classified: Secondary | ICD-10-CM

## 2019-05-24 DIAGNOSIS — M62838 Other muscle spasm: Secondary | ICD-10-CM | POA: Diagnosis not present

## 2019-05-24 DIAGNOSIS — G8929 Other chronic pain: Secondary | ICD-10-CM

## 2019-05-24 DIAGNOSIS — M25562 Pain in left knee: Secondary | ICD-10-CM | POA: Diagnosis not present

## 2019-05-24 DIAGNOSIS — E669 Obesity, unspecified: Secondary | ICD-10-CM | POA: Diagnosis not present

## 2019-05-24 DIAGNOSIS — M25551 Pain in right hip: Secondary | ICD-10-CM | POA: Diagnosis not present

## 2019-05-24 DIAGNOSIS — M545 Low back pain, unspecified: Secondary | ICD-10-CM

## 2019-05-24 DIAGNOSIS — M6281 Muscle weakness (generalized): Secondary | ICD-10-CM | POA: Diagnosis not present

## 2019-05-24 DIAGNOSIS — M25552 Pain in left hip: Secondary | ICD-10-CM | POA: Diagnosis not present

## 2019-05-24 MED ORDER — METFORMIN HCL 500 MG PO TABS: ORAL_TABLET | ORAL | 0 refills | Status: DC

## 2019-05-24 MED ORDER — ONETOUCH ULTRASOFT LANCETS MISC: 0 refills | Status: DC

## 2019-05-24 MED ORDER — GLUCOSE BLOOD VI STRP: 1.0000 | ORAL_STRIP | Freq: Two times a day (BID) | 0 refills | Status: DC

## 2019-05-24 MED ORDER — ERGOCALCIFEROL 1.25 MG (50000 UT) PO CAPS: 50000.0000 [IU] | ORAL_CAPSULE | ORAL | 0 refills | Status: DC

## 2019-05-24 MED ORDER — VICTOZA 18 MG/3ML ~~LOC~~ SOPN: PEN_INJECTOR | SUBCUTANEOUS | 0 refills | Status: DC

## 2019-05-24 MED ORDER — BD PEN NEEDLE NANO 2ND GEN 32G X 4 MM MISC: 1.0000 | Freq: Two times a day (BID) | 0 refills | Status: DC

## 2019-05-24 NOTE — Therapy (Signed)
[facility] [address] [place], [place], [address] Phone: [phone]   Fax:  [phone]  Physical Therapy Treatment  Patient Details  Name: Erin Roach Erin Roach Roach MRN: [id number] Date of Birth: 1941-04-30 Referring Provider (PT): Dr. Billey Gosling   Encounter Date: 05/24/2019  PT End of Session - 05/24/19 1022    Visit Number  18    Date for PT Re-Evaluation  06/15/19    Authorization Type  [employer] Medicare    PT Start Time  3094    PT Stop Time  1053    PT Time Calculation (min)  38 min    Activity Tolerance  Patient tolerated treatment well;No increased pain    Behavior During Therapy  WFL for tasks assessed/performed       Past Medical History:  Diagnosis Date  . Allergic rhinitis   . Apnea   . Arthritis   . Asthma    very mild  . Back pain   . Bilateral lower extremity edema   . Carotid artery disease ([place])    a. Carotid duplex 03/2016 - duplex was stable, 07-68% RICA, 0-88% LICA  . Complication of anesthesia    post-op delirium  . Endometrial polyp   . Essential hypertension   . GERD (gastroesophageal reflux disease)   . Heart murmur    per pt  . History of adenomatous polyp of colon    04-24-2016  tubular adenoma  . History of squamous cell carcinoma excision    pilonidal area second excision 02-02-2002  . HTN (hypertension)   . Hyperlipidemia   . Leg pain   . OAB (overactive bladder)   . RA (rheumatoid arthritis) ([place])   . RBBB (right bundle branch block with left anterior fascicular block)   . RLS (restless legs syndrome)   . Shortness of breath on exertion   . SUI (stress urinary incontinence, female)   . Venous insufficiency    legs  . Vitamin D deficiency     Past Surgical History:  Procedure Laterality Date  . APPENDECTOMY  07-02-2005   [name]   open  . CARDIAC CATHETERIZATION  01-23-2009  dr Darnell Level brodie   normal coronary arteries and lvf  . CARDIOVASCULAR STRESS TEST   03/31/2016   Low risk nuclear study w/ medium defect of mild severity in basal anterior and mid anterior location w/ no evidence ischemia or infarction/  normal LV function and wall motion,  nuclear stress ef 71%  . CATARACT EXTRACTION W/ INTRAOCULAR LENS IMPLANT Left 09/2016  . COLONOSCOPY  last one 04-24-2016  . HYSTEROSCOPY WITH D & C N/A 12/22/2016   Procedure: DILATATION AND CURETTAGE /HYSTEROSCOPY;  Surgeon: Dian Queen, MD;  Location: [place];  Service: Gynecology;  Laterality: N/A;  . IR US GUIDANCE  09/08/2016  . POSTERIOR LUMBAR FUSION  02/ 2018    [facility] ([place])  . SKIN CANCER EXCISION    . TONSILLECTOMY  child  . 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 TRANSTHORACIC ECHOCARDIOGRAM  01/21/2009   mild LVH, ef 11-03%, grade 1 diastolic dysfunction  . ULNAR NERVE TRANSPOSITION Left 12-20-2008  dr sypher   decompression and partial resection medial triceps fascia  . WIDE LOCAL EXCISION PILONIDAL AREA  02-02-2002  [name]   recurrent squamous cell carcinoma in situ    There were no  vitals filed for this visit.  Subjective Assessment - 05/24/19 1017    Subjective  I drank water when I got home after last visit and have felt much better since.  I had a moment of pain in my back this morning but used my seated back stretch and only had to do a few and it felt better.    Pertinent History  "Erin Roach Erin Roach Roach";  left TKR;  lumbar fusion; restless legs, HTN; diabetes;  mild neuropathy;  osteopenia    Limitations  House hold activities    How long can you stand comfortably?  6-8 min    How long can you walk comfortably?  20-30 min at grocery store with some support on grocery cart    Diagnostic tests  none    Patient Stated Goals  I would like to feel better, be more active; better quality of life    Currently in Pain?  Yes     Pain Score  2     Pain Location  Knee    Pain Orientation  Left    Pain Descriptors / Indicators  Aching    Pain Type  Chronic pain    Pain Onset  More than a month ago    Pain Frequency  Intermittent    Aggravating Factors   stand, walk    Pain Relieving Factors  sitting in supported chair                       [facility] Adult PT Treatment/Exercise - 05/24/19 0001      Exercises   Exercises  Knee/Hip;Lumbar      Lumbar Exercises: Aerobic   UBE (Upper Arm Bike)  L1 x 4' alt each min fwd/bwd    Nustep  L3 x 8' PT present to discuss progress      Lumbar Exercises: Standing   Row  Strengthening;20 reps;Theraband    Theraband Level (Row)  Level 2 (Red)    Row Limitations  PT cued form for UEs    Shoulder Extension  Strengthening;Both;20 reps;Theraband    Theraband Level (Shoulder Extension)  Level 2 (Red)    Other Standing Lumbar Exercises  red band bil UE pullforwards hands to hips for core, 20x3 sec pause for core recruitment      Lumbar Exercises: Seated   Long Arc Quad on Chair  Strengthening;3 sets;5 reps    Sit to Stand  10 reps    Sit to Stand Limitations  PT cued form on stand to sit    Other Seated Lumbar Exercises  3# hip to hip and hip to shoulder bil x 30 each with PT cue to exhale each tap    Other Seated Lumbar Exercises  2# shoulder flexion bil UEs               PT Short Term Goals - 04/17/19 1107      PT SHORT TERM GOAL #1   Title  independent with initial HEP for mobility and strengthening    Status  Achieved      PT SHORT TERM GOAL #2   Title  The patient will have improved lumbar flexion to 60 degrees and HS muscle lengths to 70 degrees needed for improved household and community mobility    Status  Achieved      PT SHORT TERM GOAL #3   Title  The patient will be able to stand for 3-4 minutes for washing dishes at the sink    Status  Achieved      PT SHORT TERM GOAL #4   Title  The patient will report back, hip, knee and right  upper arm pain about 30% improved with daily activities    Baseline  met for back/hip 30% improvement, Lt knee still ongoing pain levels    Status  Partially Met        PT Long Term Goals - 05/04/19 0943      PT LONG TERM GOAL #1   Title  independent with advanced HEP    Baseline  compliant 6 days/week, working on more dynamic exercise and standing core transition    Time  7    Period  Weeks    Status  On-going    Target Date  06/22/19      PT LONG TERM GOAL #2   Title  The patient will be able to walk 8-12 min with 50% less pain    Baseline  using better transverse plane motion with arm swing, 3 min walk test caused pain 3/10 for back and Lt knee    Time  7    Period  Weeks    Status  On-going    Target Date  06/22/19      PT LONG TERM GOAL #3   Title  The patient will have improved lumbo/pelvic/shoulder/periscapular core strength grossly 4+/5 needed for walking and standing longer periods of time    Baseline  Pt with strength ranging from 4- to 4+/5 in hip abd bil, hip flexors, knee extensors, hip rotators and trunk flexors/core, needs cueing for dynamic functional form    Time  7    Period  Weeks    Status  New    Target Date  06/22/19      PT LONG TERM GOAL #4   Title  The patient will have improved bil hip abduction/gluteal strength to 4/5 needed for standing >5 minutes    Baseline  hip strength 4-/5 to 4+/5    Time  7    Period  Weeks    Status  On-going    Target Date  06/22/19      PT LONG TERM GOAL #5   Title  FOTO functional outcome score improved from 60% limitation to 51%    Baseline  47% 05/04/19    Status  Achieved            Plan - 05/24/19 1047    Clinical Impression Statement  Pt felt much better today having discovered she was dehydrated last visit.  We reviewed sit to stands with a need for less cueing for form today by PT.  Pt performed standing tband ther ex with ongoing cueing by PT for postural alignment and technique.  She is developing  improved control and recruitment/awareness of deep core use with overlay of ther ex for both UEs and LEs in a variety of positions (sitting/standing etc).  She will benefit from continued reinforcement of dynamic posture stabilization with overlay of strength for improved technique and confidence.    Comorbidities  Lumbar fusion;  left TKR with poor result; HTN; diabetes; asthma; osteopenia; restless legs    Rehab Potential  Good    PT Frequency  2x / week    PT Duration  6 weeks    PT Treatment/Interventions  ADLs/Self Care Home Management;Therapeutic exercise;Therapeutic activities;Balance training;Neuromuscular re-education;Patient/family education;Dry needling;Taping;Moist Heat;Cryotherapy;Electrical Stimulation;Ultrasound;Manual techniques;Traction    PT Next Visit Plan  continue building confidence in standing and seated ther ex, f/u on intro  of UBE, add seated hip to hip/hip to shoulder taps to HEP if desired by Pt    PT Home Exercise Plan  Access Code: [email or web address]    Consulted and Agree with Plan of Care  Patient       Patient will benefit from skilled therapeutic intervention in order to improve the following deficits and impairments:     Visit Diagnosis: Chronic bilateral low back pain without sciatica  Muscle weakness (generalized)  Chronic pain of left knee  Difficulty in walking, not elsewhere classified     Problem List Patient Active Problem List   Diagnosis Date Noted  . Carotid arterial disease ([place]) 01/23/2019  . RBBB (right bundle branch block with left anterior fascicular block)   . Chest pain 01/22/2019  . Weakness of both lower extremities 01/02/2019  . Frequent falls 01/02/2019  . Cellulitis of right elbow 12/29/2018  . OSA on CPAP 06/15/2017  . Diabetes ([place]) 03/02/2017  . Herpes zoster without complication 79/43/2761  . Prediabetes 02/23/2017  . Shortness of breath on exertion 02/23/2017  . B12 nutritional deficiency 02/23/2017  . Vitamin D deficiency  12/08/2016  . Tubular adenoma of colon 04/29/2016  . Essential hypertension 09/12/2015  . Allergic rhinitis 07/17/2015  . Insomnia 07/17/2015  . Obese 04/10/2015  . S/P left TKA 04/09/2015  . Osteopenia 12/04/2012  . GERD (gastroesophageal reflux disease) 02/07/2012  . 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 RESTLESS LEG SYNDROME 02/11/2009  . LEG EDEMA, BILATERAL 02/11/2009  . Hyperlipidemia 10/30/2008  . Urinary incontinence 10/30/2008  . Skin cancer 10/30/2008  . Asthma 11/21/2007  . Venous (peripheral) insufficiency 11/22/2006    Baruch Merl, PT 05/24/19 10:54 AM   [facility] [address] [place], [place], [address] Phone: [phone]   Fax:  [phone]  Name: Erin Roach Erin Roach Roach MRN: [id number] Date of Birth: Sep 23, 1940

## 2019-05-26 DIAGNOSIS — H26492 Other secondary cataract, left eye: Secondary | ICD-10-CM | POA: Diagnosis not present

## 2019-05-29 ENCOUNTER — Encounter: Payer: Self-pay | Admitting: Physical Therapy

## 2019-05-29 ENCOUNTER — Other Ambulatory Visit: Payer: Self-pay

## 2019-05-29 ENCOUNTER — Ambulatory Visit: Payer: Medicare PPO | Admitting: Physical Therapy

## 2019-05-29 DIAGNOSIS — M6281 Muscle weakness (generalized): Secondary | ICD-10-CM | POA: Diagnosis not present

## 2019-05-29 DIAGNOSIS — M25551 Pain in right hip: Secondary | ICD-10-CM | POA: Diagnosis not present

## 2019-05-29 DIAGNOSIS — M25562 Pain in left knee: Secondary | ICD-10-CM | POA: Diagnosis not present

## 2019-05-29 DIAGNOSIS — M545 Low back pain, unspecified: Secondary | ICD-10-CM

## 2019-05-29 DIAGNOSIS — M25552 Pain in left hip: Secondary | ICD-10-CM | POA: Diagnosis not present

## 2019-05-29 DIAGNOSIS — R262 Difficulty in walking, not elsewhere classified: Secondary | ICD-10-CM | POA: Diagnosis not present

## 2019-05-29 DIAGNOSIS — G8929 Other chronic pain: Secondary | ICD-10-CM

## 2019-05-29 DIAGNOSIS — M62838 Other muscle spasm: Secondary | ICD-10-CM | POA: Diagnosis not present

## 2019-05-29 NOTE — Progress Notes (Signed)
Chief Complaint:   OBESITY Erin Roach is here to discuss her progress with her obesity treatment plan along with follow-up of her obesity related diagnoses. Anjani is keeping a food journal and adhering to recommended goals of 1500 calories and 85 grams of protein daily and states she is following her eating plan approximately 10% of the time. Eiko states she is doing physical therapy, bike riding, and walking for 45 minutes 6 times per week.  Today's visit was #: 59 Starting weight: 223 lbs Starting date: 02/23/17 Today's weight: 203 lbs Today's date: 05/24/2019 Total lbs lost to date: 20 Total lbs lost since last in-office visit: 0  Interim History: Kimmberly's last visit was 5 months ago. She has gained 5 lbs during this time and she is ready to get back on track. She thinks her protein intake has dropped since her last visit. She has really increased her activity with physical therapy.  Subjective:   1. Type 2 diabetes mellitus without complication, without long-term current use of insulin ([place]) Ramsha is stable on medications, she did not bring in her blood sugar log today. She denies hypoglycemia.  2. Vitamin D deficiency Pacita is stable on Vit D, and she is due for labs soon.  Assessment/Plan:   1. Type 2 diabetes mellitus without complication, without long-term current use of insulin (H[facility]) Good blood sugar control is important to decrease the likelihood of diabetic complications such as nephropathy, neuropathy, limb loss, blindness, coronary artery disease, and death. Intensive lifestyle modification including diet, exercise and weight loss are the first line of treatment for diabetes. We will refill metformin, Victoza, and nano needles, one touch lancets, and strips for 1 month. We will recheck labs at her next visit.  - metFORMIN (GLUCOPHAGE) 500 MG tablet; TAKE 1 TABLET BY MOUTH TWICE A DAY WITH A MEAL.  Dispense: 60 tablet; Refill: 0 - liraglutide (VICTOZA) 18 MG/3ML SOPN; DIAL  AND INJECT SUBCUTANEOUSLY 1.8MG  DAILY EVERY MORNING  Dispense: 3 pen; Refill: 0 - Insulin Pen Needle (BD PEN NEEDLE NANO 2ND GEN) 32G X 4 MM MISC; 1 Package by Does not apply route 2 (two) times daily.  Dispense: 100 each; Refill: 0 - glucose blood test strip; 1 each by Other route 2 (two) times daily. Use as instructed  Dispense: 100 each; Refill: 0 - Lancets (ONETOUCH ULTRASOFT) lancets; Use as instructed  Dispense: 100 each; Refill: 0  2. Vitamin D deficiency Low Vitamin D level contributes to fatigue and are associated with obesity, breast, and colon cancer. We will refill prescription Vit D for 1 month. She will follow-up for routine testing of Vitamin D, at least 2-3 times per year to avoid over-replacement. We will recheck labs in 1 month, and will continue to monitor.  - ergocalciferol (VITAMIN D2) 1.25 MG (50000 UT) capsule; Take 1 capsule (50,000 Units total) by mouth once a week. Take one tablet by mouth on Mondays  Dispense: 4 capsule; Refill: 0  3. Class 1 obesity with serious comorbidity and body mass index (BMI) of 32.0 to 32.9 in adult, unspecified obesity type Devonna is currently in the action stage of change. As such, her goal is to continue with weight loss efforts. She has agreed to keeping a food journal and adhering to recommended goals of 1500 calories and 85+ grams of protein daily.   Exercise goals: Niobe is to continue her current exercise regimen as is.  Behavioral modification strategies: increasing lean protein intake and meal planning and cooking strategies.  [name]  has agreed to follow-up with our clinic in 4 weeks. She was informed of the importance of frequent follow-up visits to maximize her success with intensive lifestyle modifications for her multiple health conditions.   Objective:   Blood pressure 137/80, pulse (!) 109, temperature 98.2 F (36.8 C), temperature source Oral, height 5\' 6"  (1.676 m), weight 203 lb (92.1 kg), SpO2 99 %. Body mass index is 32.77  kg/m.  General: Cooperative, alert, well developed, in no acute distress. HEENT: Conjunctivae and lids unremarkable. Cardiovascular: Regular rhythm.  Lungs: Normal work of breathing. Neurologic: No focal deficits.   Lab Results  Component Value Date   CREATININE 0.86 01/23/2019   BUN 16 01/23/2019   NA 140 01/23/2019   K 4.0 01/23/2019   CL 105 01/23/2019   CO2 25 01/23/2019   Lab Results  Component Value Date   ALT 36 01/22/2019   AST 26 01/22/2019   ALKPHOS 73 01/22/2019   BILITOT 0.5 01/22/2019   Lab Results  Component Value Date   HGBA1C 5.4 12/13/2018   HGBA1C 5.7 (H) 04/20/2018   HGBA1C 5.3 01/06/2018   HGBA1C 5.4 09/28/2017   HGBA1C 5.5 06/09/2017   Lab Results  Component Value Date   INSULIN 13.3 12/13/2018   INSULIN 27.9 (H) 04/20/2018   INSULIN 24.6 01/06/2018   INSULIN 17.0 09/28/2017   INSULIN 28.3 (H) 06/09/2017   Lab Results  Component Value Date   TSH 5.130 (H) 12/13/2018   Lab Results  Component Value Date   CHOL 192 01/23/2019   HDL 45 01/23/2019   LDLCALC 91 01/23/2019   LDLDIRECT 141.3 09/06/2012   TRIG 279 (H) 01/23/2019   CHOLHDL 4.3 01/23/2019   Lab Results  Component Value Date   WBC 6.2 01/23/2019   HGB 11.0 (L) 01/23/2019   HCT 32.8 (L) 01/23/2019   MCV 89.6 01/23/2019   PLT 250 01/23/2019   Lab Results  Component Value Date   IRON 109 03/02/2017   TIBC 333 03/19/2015   FERRITIN 218.0 03/02/2017    Obesity Behavioral Intervention Documentation for Insurance:   Approximately 15 minutes were spent on the discussion below.  ASK: We discussed the diagnosis of obesity with [name] today and Seraphine agreed to give [name] permission to discuss obesity behavioral modification therapy today.  ASSESS: Makaylyn has the diagnosis of obesity and her BMI today is 32.78. Ardeen is in the action stage of change.   ADVISE: Kenslei was educated on the multiple health risks of obesity as well as the benefit of weight loss to improve her health.  She was advised of the need for long term treatment and the importance of lifestyle modifications to improve her current health and to decrease her risk of future health problems.  AGREE: Multiple dietary modification options and treatment options were discussed and Shardea agreed to follow the recommendations documented in the above note.  ARRANGE: Zabdi was educated on the importance of frequent visits to treat obesity as outlined per CMS and USPSTF guidelines and agreed to schedule her next follow up appointment today.  Attestation Statements:   Reviewed by clinician on day of visit: allergies, medications, problem list, medical history, surgical history, family history, social history, and previous encounter notes.   I, Trixie Dredge, am acting as transcriptionist for Dennard Nip, MD.  I have reviewed the above documentation for accuracy and completeness, and I agree with the above. -  Dennard Nip, MD

## 2019-05-29 NOTE — Therapy (Signed)
[facility] [address] [place], [place], [address] Phone: [phone]   Fax:  [phone]  Physical Therapy Treatment  Patient Details  Name: Erin Roach MRN: [id number] Date of Birth: [birth year] Referring Provider (PT): Dr. Billey Gosling   Encounter Date: 05/29/2019  PT End of Session - 05/29/19 1021    Visit Number  19    Date for PT Re-Evaluation  06/15/19    Authorization Type  [employer]HC Medicare    PT Start Time  9449    PT Stop Time  1055    PT Time Calculation (min)  40 min    Activity Tolerance  Patient tolerated treatment well;No increased pain    Behavior During Therapy  WFL for tasks assessed/performed       Past Medical History:  Diagnosis Date  . Allergic rhinitis   . Apnea   . Arthritis   . Asthma    very mild  . Back pain   . Bilateral lower extremity edema   . Carotid artery disease ([place])    a. Carotid duplex 03/2016 - duplex was stable, 67-59% RICA, 1-63% LICA  . Complication of anesthesia    post-op delirium  . Endometrial polyp   . Essential hypertension   . GERD (gastroesophageal reflux disease)   . Heart murmur    per pt  . History of adenomatous polyp of colon    04-24-2016  tubular adenoma  . History of squamous cell carcinoma excision    pilonidal area second excision 02-02-2002  . HTN (hypertension)   . Hyperlipidemia   . Leg pain   . OAB (overactive bladder)   . RA (rheumatoid arthritis) ([place])   . RBBB (right bundle branch block with left anterior fascicular block)   . RLS (restless legs syndrome)   . Shortness of breath on exertion   . SUI (stress urinary incontinence, female)   . Venous insufficiency    legs  . Vitamin D deficiency     Past Surgical History:  Procedure Laterality Date  . APPENDECTOMY  07-02-2005   [name]   open  . CARDIAC CATHETERIZATION  01-23-2009  dr Darnell Level brodie   normal coronary arteries and lvf  . CARDIOVASCULAR STRESS TEST   03/31/2016   Low risk nuclear study w/ medium defect of mild severity in basal anterior and mid anterior location w/ no evidence ischemia or infarction/  normal LV function and wall motion,  nuclear stress ef 71%  . CATARACT EXTRACTION W/ INTRAOCULAR LENS IMPLANT Left 09/2016  . COLONOSCOPY  last one 04-24-2016  . HYSTEROSCOPY WITH D & C N/A 12/22/2016   Procedure: DILATATION AND CURETTAGE /HYSTEROSCOPY;  Surgeon: Dian Queen, MD;  Location: [facility];  Service: Gynecology;  Laterality: N/A;  . IR US GUIDANCE  09/08/2016  . POSTERIOR LUMBAR FUSION  02/ 2018    [facility] ([place])  . SKIN CANCER EXCISION    . TONSILLECTOMY  child  . 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 TRANSTHORACIC ECHOCARDIOGRAM  01/21/2009   mild LVH, ef 84-66%, grade 1 diastolic dysfunction  . ULNAR NERVE TRANSPOSITION Left 12-20-2008  dr sypher   decompression and partial resection medial triceps fascia  . WIDE LOCAL EXCISION PILONIDAL AREA  02-02-2002  [name]   recurrent squamous cell carcinoma in situ    There were no  vitals filed for this visit.  Subjective Assessment - 05/29/19 1017    Subjective  I went to two soccer games yesterday. I stood for 30 min 2x for each game.  I then went to the grocery store and was in a lot of pain at the end of the day.  It was too much.    Pertinent History  "Erin Roach";  left TKR;  lumbar fusion; restless legs, HTN; diabetes;  mild neuropathy;  osteopenia    Limitations  House hold activities    How long can you stand comfortably?  30 min but then it hurts    How long can you walk comfortably?  20-30 min at grocery store with some support on grocery cart    Patient Stated Goals  I would like to feel better, be more active; better quality of life    Currently in Pain?  Yes    Pain Score  1     Pain Orientation   Lower;Mid;Right    Pain Descriptors / Indicators  Aching    Pain Type  Chronic pain    Pain Onset  More than a month ago    Pain Frequency  Intermittent    Aggravating Factors   stand, walk    Pain Relieving Factors  sitting in supported chair    Pain Onset  More than a month ago    Pain Frequency  Intermittent    Aggravating Factors   walk, stand    Pain Relieving Factors  exercises                       [facility] Adult PT Treatment/Exercise - 05/29/19 0001      Exercises   Exercises  Lumbar;Knee/Hip;Shoulder      Lumbar Exercises: Stretches   Active Hamstring Stretch  Left;Right;20 seconds;2 reps    Active Hamstring Stretch Limitations  foot on second step    Hip Flexor Stretch  Left;Right;10 seconds;5 reps    Hip Flexor Stretch Limitations  foot on second step      Lumbar Exercises: Aerobic   UBE (Upper Arm Bike)  L1 x 4' alt each min fwd/bwd    Nustep  L3 x 5' PT present to discuss progress      Lumbar Exercises: Seated   Long Arc Quad on Chair  Strengthening;3 sets;5 reps    Other Seated Lumbar Exercises  3# hip to hip and hip to shoulder bil x 30 each with PT cue to exhale each tap    Other Seated Lumbar Exercises  2# shoulder flexion bil UEs      Knee/Hip Exercises: Standing   SLS  foot on 6" stand tall with PT posture VCs to align trunk over pelvis 2x10 sec bil, no UE support      Knee/Hip Exercises: Seated   Clamshell with TheraBand  Green   15   Marching  Strengthening;Both;20 reps    Marching Limitations  green band, PT cued slow for control and recruitment      Shoulder Exercises: Standing   Flexion  Strengthening;Both;Theraband;20 reps    Theraband Level (Shoulder Flexion)  Level 2 (Red)    Extension  Strengthening;Both;Theraband;20 reps    Theraband Level (Shoulder Extension)  Level 2 (Red)    Row  Strengthening;Both;20 reps;Theraband    Theraband Level (Shoulder Row)  Level 2 (Red)               PT Short Term Goals - 04/17/19 1107  PT SHORT TERM GOAL #1   Title  independent with initial HEP for mobility and strengthening    Status  Achieved      PT SHORT TERM GOAL #2   Title  The patient will have improved lumbar flexion to 60 degrees and HS muscle lengths to 70 degrees needed for improved household and community mobility    Status  Achieved      PT SHORT TERM GOAL #3   Title  The patient will be able to stand for 3-4 minutes for washing dishes at the sink    Status  Achieved      PT SHORT TERM GOAL #4   Title  The patient will report back, hip, knee and right upper arm pain about 30% improved with daily activities    Baseline  met for back/hip 30% improvement, Lt knee still ongoing pain levels    Status  Partially Met        PT Long Term Goals - 05/04/19 0943      PT LONG TERM GOAL #1   Title  independent with advanced HEP    Baseline  compliant 6 days/week, working on more dynamic exercise and standing core transition    Time  7    Period  Weeks    Status  On-going    Target Date  06/22/19      PT LONG TERM GOAL #2   Title  The patient will be able to walk 8-12 min with 50% less pain    Baseline  using better transverse plane motion with arm swing, 3 min walk test caused pain 3/10 for back and Lt knee    Time  7    Period  Weeks    Status  On-going    Target Date  06/22/19      PT LONG TERM GOAL #3   Title  The patient will have improved lumbo/pelvic/shoulder/periscapular core strength grossly 4+/5 needed for walking and standing longer periods of time    Baseline  Pt with strength ranging from 4- to 4+/5 in hip abd bil, hip flexors, knee extensors, hip rotators and trunk flexors/core, needs cueing for dynamic functional form    Time  7    Period  Weeks    Status  New    Target Date  06/22/19      PT LONG TERM GOAL #4   Title  The patient will have improved bil hip abduction/gluteal strength to 4/5 needed for standing >5 minutes    Baseline  hip strength 4-/5 to 4+/5    Time  7     Period  Weeks    Status  On-going    Target Date  06/22/19      PT LONG TERM GOAL #5   Title  FOTO functional outcome score improved from 60% limitation to 51%    Baseline  47% 05/04/19    Status  Achieved            Plan - 05/29/19 1041    Clinical Impression Statement  Pt with flare up of pain over the weekend standing at soccer games.  She recovered after resting yesterday and demo'd improving postural awareness and exercise tolerance and endurance today.  She has mastered breath coordination with ther ex.  She needed cueing for standing with one foot on stair to align trunk over pelvis.  Her balance in this SLS position was excellent without balance loss and without UE support.  Continue along POC with  focus on strength, posture and stabilization independence.    Comorbidities  Lumbar fusion;  left TKR with poor result; HTN; diabetes; asthma; osteopenia; restless legs    Rehab Potential  Good    PT Frequency  2x / week    PT Duration  6 weeks    PT Treatment/Interventions  ADLs/Self Care Home Management;Therapeutic exercise;Therapeutic activities;Balance training;Neuromuscular re-education;Patient/family education;Dry needling;Taping;Moist Heat;Cryotherapy;Electrical Stimulation;Ultrasound;Manual techniques;Traction    PT Next Visit Plan  continue building confidence in standing and seated ther ex, f/u on intro of UBE    PT Home Exercise Plan  Access Code: [email or web address]    Consulted and Agree with Plan of Care  Patient       Patient will benefit from skilled therapeutic intervention in order to improve the following deficits and impairments:     Visit Diagnosis: Chronic bilateral low back pain without sciatica  Muscle weakness (generalized)  Chronic pain of left knee  Difficulty in walking, not elsewhere classified     Problem List Patient Active Problem List   Diagnosis Date Noted  . Carotid arterial disease ([place]) 01/23/2019  . RBBB (right bundle branch block with left  anterior fascicular block)   . Chest pain 01/22/2019  . Weakness of both lower extremities 01/02/2019  . Frequent falls 01/02/2019  . Cellulitis of right elbow 12/29/2018  . OSA on CPAP 06/15/2017  . Diabetes ([place]) 03/02/2017  . Herpes zoster without complication 40/02/2724  . Prediabetes 02/23/2017  . Shortness of breath on exertion 02/23/2017  . B12 nutritional deficiency 02/23/2017  . Vitamin D deficiency 12/08/2016  . Tubular adenoma of colon 04/29/2016  . Essential hypertension 09/12/2015  . Allergic rhinitis 07/17/2015  . Insomnia 07/17/2015  . Obese 04/10/2015  . S/P left TKA 04/09/2015  . Osteopenia 12/04/2012  . GERD (gastroesophageal reflux disease) 02/07/2012  . 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 RESTLESS LEG SYNDROME 02/11/2009  . LEG EDEMA, BILATERAL 02/11/2009  . Hyperlipidemia 10/30/2008  . Urinary incontinence 10/30/2008  . Skin cancer 10/30/2008  . Asthma 11/21/2007  . Venous (peripheral) insufficiency 11/22/2006    Baruch Merl, PT 05/29/19 10:55 AM    [facility] [address] [place], [place], [address] Phone: [phone]   Fax:  [phone]  Name: Erin Roach MRN: [id number] Date of Birth: 08/10/1940

## 2019-05-30 ENCOUNTER — Encounter: Payer: Self-pay | Admitting: Internal Medicine

## 2019-05-31 ENCOUNTER — Ambulatory Visit: Payer: Medicare PPO | Admitting: Physical Therapy

## 2019-05-31 MED ORDER — HYDROCODONE-ACETAMINOPHEN 5-325 MG PO TABS: 1.0000 | ORAL_TABLET | Freq: Every day | ORAL | 0 refills | Status: DC

## 2019-05-31 NOTE — Telephone Encounter (Signed)
Check [place] registry last filled 05/03/2019../lmb  

## 2019-06-05 ENCOUNTER — Ambulatory Visit: Payer: Medicare PPO | Admitting: Physical Therapy

## 2019-06-05 ENCOUNTER — Other Ambulatory Visit: Payer: Self-pay

## 2019-06-05 ENCOUNTER — Encounter: Payer: Self-pay | Admitting: Physical Therapy

## 2019-06-05 DIAGNOSIS — G8929 Other chronic pain: Secondary | ICD-10-CM | POA: Diagnosis not present

## 2019-06-05 DIAGNOSIS — M545 Low back pain, unspecified: Secondary | ICD-10-CM

## 2019-06-05 DIAGNOSIS — M62838 Other muscle spasm: Secondary | ICD-10-CM | POA: Diagnosis not present

## 2019-06-05 DIAGNOSIS — M25562 Pain in left knee: Secondary | ICD-10-CM | POA: Diagnosis not present

## 2019-06-05 DIAGNOSIS — M6281 Muscle weakness (generalized): Secondary | ICD-10-CM

## 2019-06-05 DIAGNOSIS — M25551 Pain in right hip: Secondary | ICD-10-CM | POA: Diagnosis not present

## 2019-06-05 DIAGNOSIS — M25552 Pain in left hip: Secondary | ICD-10-CM | POA: Diagnosis not present

## 2019-06-05 DIAGNOSIS — R262 Difficulty in walking, not elsewhere classified: Secondary | ICD-10-CM | POA: Diagnosis not present

## 2019-06-05 NOTE — Therapy (Signed)
[facility] [address] [place], [place], [address] Phone: [phone]   Fax:  [phone]  Physical Therapy Treatment  Patient Details  Name: Erin Roach MRN: [id number] Date of Birth: April 04, 1941 Referring Provider (PT): Dr. Billey Gosling   Progress Note Reporting Period 04/24/19  to 06/05/19  See note below for Objective Data and Assessment of Progress/Goals.      Encounter Date: 06/05/2019  PT End of Session - 06/05/19 1014    Visit Number  20    Date for PT Re-Evaluation  06/15/19    Authorization Type  UHC Medicare    PT Start Time  6962    PT Stop Time  1055    PT Time Calculation (min)  40 min    Activity Tolerance  Patient tolerated treatment well;No increased pain    Behavior During Therapy  WFL for tasks assessed/performed       Past Medical History:  Diagnosis Date  . Allergic rhinitis   . Apnea   . Arthritis   . Asthma    very mild  . Back pain   . Bilateral lower extremity edema   . Carotid artery disease ([place])    a. Carotid duplex 03/2016 - duplex was stable, 95-28% RICA, 4-13% LICA  . Complication of anesthesia    post-op delirium  . Endometrial polyp   . Essential hypertension   . GERD (gastroesophageal reflux disease)   . Heart murmur    per pt  . History of adenomatous polyp of colon    04-24-2016  tubular adenoma  . History of squamous cell carcinoma excision    pilonidal area second excision 02-02-2002  . HTN (hypertension)   . Hyperlipidemia   . Leg pain   . OAB (overactive bladder)   . RA (rheumatoid arthritis) ([place])   . RBBB (right bundle branch block with left anterior fascicular block)   . RLS (restless legs syndrome)   . Shortness of breath on exertion   . SUI (stress urinary incontinence, female)   . Venous insufficiency    legs  . Vitamin D deficiency     Past Surgical History:  Procedure Laterality Date  . APPENDECTOMY  07-02-2005   [name]   open   . CARDIAC CATHETERIZATION  01-23-2009  dr Darnell Level brodie   normal coronary arteries and lvf  . CARDIOVASCULAR STRESS TEST  03/31/2016   Low risk nuclear study w/ medium defect of mild severity in basal anterior and mid anterior location w/ no evidence ischemia or infarction/  normal LV function and wall motion,  nuclear stress ef 71%  . CATARACT EXTRACTION W/ INTRAOCULAR LENS IMPLANT Left 09/2016  . COLONOSCOPY  last one 04-24-2016  . HYSTEROSCOPY WITH D & C N/A 12/22/2016   Procedure: DILATATION AND CURETTAGE /HYSTEROSCOPY;  Surgeon: Dian Queen, MD;  Location: [facility];  Service: Gynecology;  Laterality: N/A;  . IR US GUIDANCE  09/08/2016  . POSTERIOR LUMBAR FUSION  02/ 2018    [facility] ([place], [place])  . SKIN CANCER EXCISION    . TONSILLECTOMY  child  . 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 TRANSTHORACIC ECHOCARDIOGRAM  01/21/2009   mild LVH, ef 24-40%, grade 1 diastolic dysfunction  . ULNAR NERVE TRANSPOSITION Left 12-20-2008  dr sypher   decompression and partial resection medial triceps fascia  .  WIDE LOCAL EXCISION PILONIDAL AREA  02-02-2002  [name]   recurrent squamous cell carcinoma in situ    There were no vitals filed for this visit.  Subjective Assessment - 06/05/19 1013    Subjective  Feeling pretty good.  I had some back pain when I woke up but the exercises helped.    Pertinent History  "Erin Roach";  left TKR;  lumbar fusion; restless legs, HTN; diabetes;  mild neuropathy;  osteopenia    Limitations  House hold activities    How long can you stand comfortably?  30 min but then it hurts    How long can you walk comfortably?  20-30 min at grocery store with some support on grocery cart    Diagnostic tests  none    Patient Stated Goals  I would like to feel better, be more active; better quality of life     Currently in Pain?  Yes    Pain Score  2     Pain Location  Knee    Pain Orientation  Left;Anterior    Pain Descriptors / Indicators  Aching    Pain Type  Chronic pain    Pain Onset  More than a month ago    Pain Frequency  Intermittent    Aggravating Factors   stand, walk    Pain Relieving Factors  sitting in supported chair, HEP         [facility] PT Assessment - 06/05/19 0001      Assessment   Medical Diagnosis  weakness LEs; falls    Referring Provider (PT)  Dr. Billey Gosling    Onset Date/Surgical Date  08/09/18    Next MD Visit  as needed     Prior Therapy  in past for TKR;  fusion       Posture/Postural Control   Posture Comments  improved alignment and awareness in sitting, standing and transitional movements      ROM / Strength   AROM / PROM / Strength  Strength      Strength   Overall Strength Comments  core 4/5, Lt hip 4+/5, Rt hip ext, flex, abd 4/5, all other Rt LE 4+/5                   [facility] Adult PT Treatment/Exercise - 06/05/19 0001      Exercises   Exercises  Lumbar;Knee/Hip;Shoulder      Lumbar Exercises: Stretches   Active Hamstring Stretch  Right    Active Hamstring Stretch Limitations  dynamic in supine hooklying with knee flex/ext x 30    Lower Trunk Rotation  5 reps;10 seconds    Gastroc Stretch  2 reps;20 seconds;Right;Left    Gastroc Stretch Limitations  slant board      Lumbar Exercises: Aerobic   UBE (Upper Arm Bike)  L2 2x2 alt fwd/bwd   PT present    Nustep  L3 x 6', PT present to review HEP and goals      Lumbar Exercises: Standing   Heel Raises  15 reps    Other Standing Lumbar Exercises  standing foot on step, PT used TCs to align trunk over pelvis      Lumbar Exercises: Seated   Other Seated Lumbar Exercises  4# hip to hip and hip to shoulder diagonals x 30 each with exhale on tapping, core cueing    Other Seated Lumbar Exercises  2# single and double UE shoulder flexion to 90 deg x 15 reps, PT noted good  alignent, cued  scapular setting vs rounded scapulae      Knee/Hip Exercises: Supine   Bridges with Clamshell  Strengthening;Both;2 sets;20 reps    Straight Leg Raises  Strengthening;Both;10 reps    Straight Leg Raises Limitations  able to hover Rt LE off table between all reps, unable on Lt                PT Short Term Goals - 04/17/19 1107      PT SHORT TERM GOAL #1   Title  independent with initial HEP for mobility and strengthening    Status  Achieved      PT SHORT TERM GOAL #2   Title  The patient will have improved lumbar flexion to 60 degrees and HS muscle lengths to 70 degrees needed for improved household and community mobility    Status  Achieved      PT SHORT TERM GOAL #3   Title  The patient will be able to stand for 3-4 minutes for washing dishes at the sink    Status  Achieved      PT SHORT TERM GOAL #4   Title  The patient will report back, hip, knee and right upper arm pain about 30% improved with daily activities    Baseline  met for back/hip 30% improvement, Lt knee still ongoing pain levels    Status  Partially Met        PT Long Term Goals - 05/04/19 0943      PT LONG TERM GOAL #1   Title  independent with advanced HEP    Baseline  compliant 6 days/week, working on more dynamic exercise and standing core transition    Time  7    Period  Weeks    Status  On-going    Target Date  06/22/19      PT LONG TERM GOAL #2   Title  The patient will be able to walk 8-12 min with 50% less pain    Baseline  using better transverse plane motion with arm swing, 3 min walk test caused pain 3/10 for back and Lt knee    Time  7    Period  Weeks    Status  On-going    Target Date  06/22/19      PT LONG TERM GOAL #3   Title  The patient will have improved lumbo/pelvic/shoulder/periscapular core strength grossly 4+/5 needed for walking and standing longer periods of time    Baseline  Pt with strength ranging from 4- to 4+/5 in hip abd bil, hip flexors, knee extensors, hip  rotators and trunk flexors/core, needs cueing for dynamic functional form    Time  7    Period  Weeks    Status  New    Target Date  06/22/19      PT LONG TERM GOAL #4   Title  The patient will have improved bil hip abduction/gluteal strength to 4/5 needed for standing >5 minutes    Baseline  hip strength 4-/5 to 4+/5    Time  7    Period  Weeks    Status  On-going    Target Date  06/22/19      PT LONG TERM GOAL #5   Title  FOTO functional outcome score improved from 60% limitation to 51%    Baseline  47% 05/04/19    Status  Achieved            Plan - 06/05/19 1049  Clinical Impression Statement  Pt brought her typed list of ther ex from HEP with quesions today having bought some dumbbells and needing updates on how to stretch Rt hamstring without getting tingling in Rt foot.  PT made written notes to update quesitons she had an reviewed seated, supine and standing ther ex with occassional need for cueing on form, breathing and posture alignment.  Pt was worn out end of session but no increase in pain. She continues to use HEP as tool to reduce intermittment pain and is nearing d/c next week.    Comorbidities  Lumbar fusion;  left TKR with poor result; HTN; diabetes; asthma; osteopenia; restless legs    Rehab Potential  Good    PT Frequency  2x / week    PT Duration  6 weeks    PT Treatment/Interventions  ADLs/Self Care Home Management;Therapeutic exercise;Therapeutic activities;Balance training;Neuromuscular re-education;Patient/family education;Dry needling;Taping;Moist Heat;Cryotherapy;Electrical Stimulation;Ultrasound;Manual techniques;Traction    PT Next Visit Plan  continue building confidence in standing and seated ther ex, f/u on intro of UBE    PT Home Exercise Plan  Access Code: [email or web address]    Consulted and Agree with Plan of Care  Patient       Patient will benefit from skilled therapeutic intervention in order to improve the following deficits and impairments:   Difficulty walking, Increased fascial restricitons, Increased muscle spasms, Impaired UE functional use, Pain, Impaired perceived functional ability, Decreased activity tolerance, Impaired flexibility, Decreased strength  Visit Diagnosis: Chronic bilateral low back pain without sciatica  Muscle weakness (generalized)  Chronic pain of left knee     Problem List Patient Active Problem List   Diagnosis Date Noted  . Carotid arterial disease ([place]) 01/23/2019  . RBBB (right bundle branch block with left anterior fascicular block)   . Chest pain 01/22/2019  . Weakness of both lower extremities 01/02/2019  . Frequent falls 01/02/2019  . Cellulitis of right elbow 12/29/2018  . OSA on CPAP 06/15/2017  . Diabetes ([place]) 03/02/2017  . Herpes zoster without complication 17/61/6073  . Prediabetes 02/23/2017  . Shortness of breath on exertion 02/23/2017  . B12 nutritional deficiency 02/23/2017  . Vitamin D deficiency 12/08/2016  . Tubular adenoma of colon 04/29/2016  . Essential hypertension 09/12/2015  . Allergic rhinitis 07/17/2015  . Insomnia 07/17/2015  . Obese 04/10/2015  . S/P left TKA 04/09/2015  . Osteopenia 12/04/2012  . GERD (gastroesophageal reflux disease) 02/07/2012  . 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 RESTLESS LEG SYNDROME 02/11/2009  . LEG EDEMA, BILATERAL 02/11/2009  . Hyperlipidemia 10/30/2008  . Urinary incontinence 10/30/2008  . Skin cancer 10/30/2008  . Asthma 11/21/2007  . Venous (peripheral) insufficiency 11/22/2006    Baruch Merl, PT 06/05/19 10:56 AM   [facility] [address] [place], [place], [address] Phone: [phone]   Fax:  [phone]  Name: Erin Roach MRN: [id number] Date of Birth: 01-Mar-1941

## 2019-06-07 ENCOUNTER — Ambulatory Visit: Payer: Medicare PPO

## 2019-06-07 ENCOUNTER — Encounter: Payer: Medicare Other | Admitting: Physical Therapy

## 2019-06-07 ENCOUNTER — Ambulatory Visit: Payer: Medicare PPO | Admitting: Physical Therapy

## 2019-06-07 ENCOUNTER — Encounter: Payer: Self-pay | Admitting: Physical Therapy

## 2019-06-07 ENCOUNTER — Other Ambulatory Visit: Payer: Self-pay

## 2019-06-07 DIAGNOSIS — M25562 Pain in left knee: Secondary | ICD-10-CM | POA: Diagnosis not present

## 2019-06-07 DIAGNOSIS — G8929 Other chronic pain: Secondary | ICD-10-CM | POA: Diagnosis not present

## 2019-06-07 DIAGNOSIS — M25551 Pain in right hip: Secondary | ICD-10-CM | POA: Diagnosis not present

## 2019-06-07 DIAGNOSIS — M62838 Other muscle spasm: Secondary | ICD-10-CM | POA: Diagnosis not present

## 2019-06-07 DIAGNOSIS — M545 Low back pain, unspecified: Secondary | ICD-10-CM

## 2019-06-07 DIAGNOSIS — R262 Difficulty in walking, not elsewhere classified: Secondary | ICD-10-CM | POA: Diagnosis not present

## 2019-06-07 DIAGNOSIS — M6281 Muscle weakness (generalized): Secondary | ICD-10-CM

## 2019-06-07 DIAGNOSIS — M25552 Pain in left hip: Secondary | ICD-10-CM | POA: Diagnosis not present

## 2019-06-07 NOTE — Therapy (Signed)
[facility] [address], [place], [place], [address] Phone: [phone]   Fax:  [phone]  Physical Therapy Treatment  Patient Details  Name: Erin Roach MRN: [id number] Date of Birth: May 03, 1941 Referring Provider (PT): Dr. Billey Gosling   Encounter Date: 06/07/2019  PT End of Session - 06/07/19 1018    Visit Number  21    Date for PT Re-Evaluation  06/15/19    Authorization Type  [employer]HC Medicare    PT Start Time  1013    PT Stop Time  1057    PT Time Calculation (min)  44 min    Activity Tolerance  Patient tolerated treatment well;No increased pain    Behavior During Therapy  WFL for tasks assessed/performed       Past Medical History:  Diagnosis Date  . Allergic rhinitis   . Apnea   . Arthritis   . Asthma    very mild  . Back pain   . Bilateral lower extremity edema   . Carotid artery disease ([place])    a. Carotid duplex 03/2016 - duplex was stable, 18-84% RICA, 1-66% LICA  . Complication of anesthesia    post-op delirium  . Endometrial polyp   . Essential hypertension   . GERD (gastroesophageal reflux disease)   . Heart murmur    per pt  . History of adenomatous polyp of colon    04-24-2016  tubular adenoma  . History of squamous cell carcinoma excision    pilonidal area second excision 02-02-2002  . HTN (hypertension)   . Hyperlipidemia   . Leg pain   . OAB (overactive bladder)   . RA (rheumatoid arthritis) ([place])   . RBBB (right bundle branch block with left anterior fascicular block)   . RLS (restless legs syndrome)   . Shortness of breath on exertion   . SUI (stress urinary incontinence, female)   . Venous insufficiency    legs  . Vitamin D deficiency     Past Surgical History:  Procedure Laterality Date  . APPENDECTOMY  07-02-2005   [name]   open  . CARDIAC CATHETERIZATION  01-23-2009  dr Darnell Level brodie   normal coronary arteries and lvf  . CARDIOVASCULAR STRESS TEST   03/31/2016   Low risk nuclear study w/ medium defect of mild severity in basal anterior and mid anterior location w/ no evidence ischemia or infarction/  normal LV function and wall motion,  nuclear stress ef 71%  . CATARACT EXTRACTION W/ INTRAOCULAR LENS IMPLANT Left 09/2016  . COLONOSCOPY  last one 04-24-2016  . HYSTEROSCOPY WITH D & C N/A 12/22/2016   Procedure: DILATATION AND CURETTAGE /HYSTEROSCOPY;  Surgeon: Dian Queen, MD;  Location: [place];  Service: Gynecology;  Laterality: N/A;  . IR US GUIDANCE  09/08/2016  . POSTERIOR LUMBAR FUSION  02/ 2018    [facility] ([place])  . SKIN CANCER EXCISION    . TONSILLECTOMY  child  . 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 TRANSTHORACIC ECHOCARDIOGRAM  01/21/2009   mild LVH, ef 06-30%, grade 1 diastolic dysfunction  . ULNAR NERVE TRANSPOSITION Left 12-20-2008  dr sypher   decompression and partial resection medial triceps fascia  . WIDE LOCAL EXCISION PILONIDAL AREA  02-02-2002  [name]   recurrent squamous cell carcinoma in situ    There were no  vitals filed for this visit.  Subjective Assessment - 06/07/19 1015    Subjective  I have had some increased restless leg symptoms on waking today.  Rt>Lt since stretching and walkingwhich has helped somewhat.    Pertinent History  "[place]";  left TKR;  lumbar fusion; restless legs, HTN; diabetes;  mild neuropathy;  osteopenia    Limitations  House hold activities    How long can you stand comfortably?  30 min but then it hurts    How long can you walk comfortably?  20-30 min at grocery store with some support on grocery cart    Diagnostic tests  none    Patient Stated Goals  I would like to feel better, be more active; better quality of life    Currently in Pain?  Yes    Pain Score  2    Lt knee 1/10, lumbar 2/10, Rt leg 3/10    Pain Location  Back    Pain Orientation  Right    Pain Descriptors / Indicators  Aching    Pain Type  Chronic pain    Pain Onset  More than a month ago    Pain Frequency  Intermittent    Aggravating Factors   stand, walk    Pain Relieving Factors  sitting in supported chair, HEP                       [facility] Adult PT Treatment/Exercise - 06/07/19 0001      Lumbar Exercises: Stretches   Active Hamstring Stretch  Right    Active Hamstring Stretch Limitations  dynamic in supine hooklying with knee flex/ext x 30    Lower Trunk Rotation  3 reps;20 seconds    Other Lumbar Stretch Exercise  large green ball seated rollouts x 5 each way (3 ways)      Lumbar Exercises: Aerobic   Nustep  L3x8' PT present to discuss symptoms/HEP      Lumbar Exercises: Standing   Heel Raises  15 reps      Knee/Hip Exercises: Standing   Hip Abduction  Stengthening;2 sets;5 reps;Knee straight    Hip Extension  Stengthening;2 sets;5 reps;Knee straight      Shoulder Exercises: Standing   Flexion  Strengthening;Both;10 reps;Theraband    Theraband Level (Shoulder Flexion)  Level 2 (Red)    Flexion Limitations  hold 5 sec for core cueing and posture alignment self checks    Extension  Strengthening;Both;10 reps;Theraband    Theraband Level (Shoulder Extension)  Level 2 (Red)    Extension Limitations  5 sec hold for TrA    Row  Strengthening;Theraband;Both;10 reps    Theraband Level (Shoulder Row)  Level 2 (Red)    Row Limitations  standing with posture awareness      Manual Therapy   Soft tissue mobilization  spikey roller massage bil quads and ITB supine hooklying               PT Short Term Goals - 04/17/19 1107      PT SHORT TERM GOAL #1   Title  independent with initial HEP for mobility and strengthening    Status  Achieved      PT SHORT TERM GOAL #2   Title  The patient will have improved lumbar flexion to 60 degrees and HS muscle lengths to 70 degrees needed for improved  household and community mobility    Status  Achieved      PT SHORT TERM GOAL #  3   Title  The patient will be able to stand for 3-4 minutes for washing dishes at the sink    Status  Achieved      PT SHORT TERM GOAL #4   Title  The patient will report back, hip, knee and right upper arm pain about 30% improved with daily activities    Baseline  met for back/hip 30% improvement, Lt knee still ongoing pain levels    Status  Partially Met        PT Long Term Goals - 05/04/19 0943      PT LONG TERM GOAL #1   Title  independent with advanced HEP    Baseline  compliant 6 days/week, working on more dynamic exercise and standing core transition    Time  7    Period  Weeks    Status  On-going    Target Date  06/22/19      PT LONG TERM GOAL #2   Title  The patient will be able to walk 8-12 min with 50% less pain    Baseline  using better transverse plane motion with arm swing, 3 min walk test caused pain 3/10 for back and Lt knee    Time  7    Period  Weeks    Status  On-going    Target Date  06/22/19      PT LONG TERM GOAL #3   Title  The patient will have improved lumbo/pelvic/shoulder/periscapular core strength grossly 4+/5 needed for walking and standing longer periods of time    Baseline  Pt with strength ranging from 4- to 4+/5 in hip abd bil, hip flexors, knee extensors, hip rotators and trunk flexors/core, needs cueing for dynamic functional form    Time  7    Period  Weeks    Status  New    Target Date  06/22/19      PT LONG TERM GOAL #4   Title  The patient will have improved bil hip abduction/gluteal strength to 4/5 needed for standing >5 minutes    Baseline  hip strength 4-/5 to 4+/5    Time  7    Period  Weeks    Status  On-going    Target Date  06/22/19      PT LONG TERM GOAL #5   Title  FOTO functional outcome score improved from 60% limitation to 51%    Baseline  47% 05/04/19    Status  Achieved            Plan - 06/07/19 1050    Clinical Impression  Statement  Pt arrived with pain ranges from 1-3/10 including Rt leg pain > Lt related to restless legs.  PT used spikey roller for massage of bil LEs which helped Pt's leg pain.  PT reviewed all standing UE with core and posture related ther ex from HEP today to ensure proper postural alignment and technique.  Pt needed min cueing for initial reps to bring trunk over pelvis but was then able to maintain without further cueing this session.  She continues to get relief with stretching for lumbar and LEs for her multiple chronic pain locations.  She will continue to benefit from skilled PT with plan to d/c to HEP next week.    Comorbidities  Lumbar fusion;  left TKR with poor result; HTN; diabetes; asthma; osteopenia; restless legs    PT Frequency  2x / week    PT Duration  6 weeks  PT Treatment/Interventions  ADLs/Self Care Home Management;Therapeutic exercise;Therapeutic activities;Balance training;Neuromuscular re-education;Patient/family education;Dry needling;Taping;Moist Heat;Cryotherapy;Electrical Stimulation;Ultrasound;Manual techniques;Traction    PT Next Visit Plan  UBE, NuStep, standing ther ex for independence and reinforcement of postural alignment    PT Home Exercise Plan  Access Code: [email or web address]    Consulted and Agree with Plan of Care  Patient       Patient will benefit from skilled therapeutic intervention in order to improve the following deficits and impairments:     Visit Diagnosis: Chronic bilateral low back pain without sciatica  Muscle weakness (generalized)  Chronic pain of left knee  Pain in right hip     Problem List Patient Active Problem List   Diagnosis Date Noted  . Carotid arterial disease ([place]) 01/23/2019  . RBBB (right bundle branch block with left anterior fascicular block)   . Chest pain 01/22/2019  . Weakness of both lower extremities 01/02/2019  . Frequent falls 01/02/2019  . Cellulitis of right elbow 12/29/2018  . OSA on CPAP 06/15/2017  .  Diabetes ([place]) 03/02/2017  . Herpes zoster without complication 19/05/2222  . Prediabetes 02/23/2017  . Shortness of breath on exertion 02/23/2017  . B12 nutritional deficiency 02/23/2017  . Vitamin D deficiency 12/08/2016  . Tubular adenoma of colon 04/29/2016  . Essential hypertension 09/12/2015  . Allergic rhinitis 07/17/2015  . Insomnia 07/17/2015  . Obese 04/10/2015  . S/P left TKA 04/09/2015  . Osteopenia 12/04/2012  . GERD (gastroesophageal reflux disease) 02/07/2012  . 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 RESTLESS LEG SYNDROME 02/11/2009  . LEG EDEMA, BILATERAL 02/11/2009  . Hyperlipidemia 10/30/2008  . Urinary incontinence 10/30/2008  . Skin cancer 10/30/2008  . Asthma 11/21/2007  . Venous (peripheral) insufficiency 11/22/2006   Baruch Merl, PT 06/07/19 10:57 AM    [facility] [address], [place], [place], [address] Phone: [phone]   Fax:  [phone]  Name: MAISEN SCHMIT MRN: [id number] Date of Birth: 1941-01-22

## 2019-06-12 ENCOUNTER — Ambulatory Visit: Payer: Medicare PPO | Attending: Internal Medicine | Admitting: Physical Therapy

## 2019-06-12 ENCOUNTER — Encounter: Payer: Self-pay | Admitting: Physical Therapy

## 2019-06-12 ENCOUNTER — Other Ambulatory Visit: Payer: Self-pay

## 2019-06-12 DIAGNOSIS — G8929 Other chronic pain: Secondary | ICD-10-CM | POA: Insufficient documentation

## 2019-06-12 DIAGNOSIS — M25562 Pain in left knee: Secondary | ICD-10-CM | POA: Diagnosis not present

## 2019-06-12 DIAGNOSIS — M545 Low back pain, unspecified: Secondary | ICD-10-CM

## 2019-06-12 DIAGNOSIS — M6281 Muscle weakness (generalized): Secondary | ICD-10-CM | POA: Insufficient documentation

## 2019-06-12 DIAGNOSIS — M25551 Pain in right hip: Secondary | ICD-10-CM

## 2019-06-12 NOTE — Therapy (Signed)
[facility] [address] [place], [place], [address] Phone: [phone]   Fax:  [phone]  Physical Therapy Treatment  Patient Details  Name: Erin Roach MRN: [id number] Date of Birth: [birth year] Referring Provider (PT): Dr. Billey Gosling   Encounter Date: 06/12/2019  PT End of Session - 06/12/19 1020    Visit Number  22    Date for PT Re-Evaluation  06/15/19    Authorization Type  UHC Medicare    PT Start Time  1018    PT Stop Time  1100    PT Time Calculation (min)  42 min    Activity Tolerance  Patient tolerated treatment well;No increased pain    Behavior During Therapy  WFL for tasks assessed/performed       Past Medical History:  Diagnosis Date  . Allergic rhinitis   . Apnea   . Arthritis   . Asthma    very mild  . Back pain   . Bilateral lower extremity edema   . Carotid artery disease ([place])    a. Carotid duplex 03/2016 - duplex was stable, 62-22% RICA, 9-79% LICA  . Complication of anesthesia    post-op delirium  . Endometrial polyp   . Essential hypertension   . GERD (gastroesophageal reflux disease)   . Heart murmur    per pt  . History of adenomatous polyp of colon    04-24-2016  tubular adenoma  . History of squamous cell carcinoma excision    pilonidal area second excision 02-02-2002  . HTN (hypertension)   . Hyperlipidemia   . Leg pain   . OAB (overactive bladder)   . RA (rheumatoid arthritis) ([place])   . RBBB (right bundle branch block with left anterior fascicular block)   . RLS (restless legs syndrome)   . Shortness of breath on exertion   . SUI (stress urinary incontinence, female)   . Venous insufficiency    legs  . Vitamin D deficiency     Past Surgical History:  Procedure Laterality Date  . APPENDECTOMY  07-02-2005   [name]   open  . CARDIAC CATHETERIZATION  01-23-2009  dr Darnell Level brodie   normal coronary arteries and lvf  . CARDIOVASCULAR STRESS TEST   03/31/2016   Low risk nuclear study w/ medium defect of mild severity in basal anterior and mid anterior location w/ no evidence ischemia or infarction/  normal LV function and wall motion,  nuclear stress ef 71%  . CATARACT EXTRACTION W/ INTRAOCULAR LENS IMPLANT Left 09/2016  . COLONOSCOPY  last one 04-24-2016  . HYSTEROSCOPY WITH D & C N/A 12/22/2016   Procedure: DILATATION AND CURETTAGE /HYSTEROSCOPY;  Surgeon: Dian Queen, MD;  Location: [place];  Service: Gynecology;  Laterality: N/A;  . IR US GUIDANCE  09/08/2016  . POSTERIOR LUMBAR FUSION  02/ 2018    [facility] ([place], [place])  . SKIN CANCER EXCISION    . TONSILLECTOMY  child  . 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 TRANSTHORACIC ECHOCARDIOGRAM  01/21/2009   mild LVH, ef 89-21%, grade 1 diastolic dysfunction  . ULNAR NERVE TRANSPOSITION Left 12-20-2008  dr sypher   decompression and partial resection medial triceps fascia  . WIDE LOCAL EXCISION PILONIDAL AREA  02-02-2002  [name]   recurrent squamous cell carcinoma in situ    There were no  vitals filed for this visit.  Subjective Assessment - 06/12/19 1021    Subjective  I've done well since last visit.  When I have some back issues the exercises help.    Pertinent History  "[name]";  left TKR;  lumbar fusion; restless legs, HTN; diabetes;  mild neuropathy;  osteopenia    Limitations  House hold activities    How long can you stand comfortably?  30 min but then it hurts    How long can you walk comfortably?  20-30 min at grocery store with some support on grocery cart    Diagnostic tests  none    Patient Stated Goals  I would like to feel better, be more active; better quality of life    Currently in Pain?  Yes    Pain Score  2     Pain Location  Knee    Pain Orientation  Left    Pain Descriptors / Indicators   Aching    Pain Type  Chronic pain    Pain Onset  More than a month ago    Pain Frequency  Intermittent    Aggravating Factors   stand, walk    Pain Relieving Factors  sitting in supported chair, HEP                       [facility] Adult PT Treatment/Exercise - 06/12/19 0001      Lumbar Exercises: Stretches   Active Hamstring Stretch  Left;Right;2 reps;20 seconds    Active Hamstring Stretch Limitations  with green strap    Lower Trunk Rotation  3 reps;20 seconds    Hip Flexor Stretch  Left;Right;3 reps;10 seconds    Piriformis Stretch  Left;Right;2 reps;20 seconds    Gastroc Stretch  Left;Right;2 reps;20 seconds    Gastroc Stretch Limitations  slant board      Lumbar Exercises: Aerobic   Nustep  L3 x 8', PT present to discuss progress      Lumbar Exercises: Supine   Bridge with clamshell  20 reps   2x10     Knee/Hip Exercises: Standing   Hip Abduction  Stengthening;2 sets;5 reps;Knee straight    Hip Extension  Stengthening;2 sets;5 reps;Knee straight      Knee/Hip Exercises: Seated   Marching  Strengthening;Both;20 reps;2 sets    Marching Limitations  blue band around knees    Abd/Adduction Limitations  seated hip abd with resistance band blue 2x20      Knee/Hip Exercises: Supine   Straight Leg Raises  Strengthening;Both;5 sets;3 sets      Shoulder Exercises: Standing   Flexion  Strengthening;Both;10 reps;Theraband    Theraband Level (Shoulder Flexion)  Level 2 (Red)    Extension  Strengthening;Both;10 reps;Theraband    Theraband Level (Shoulder Extension)  Level 2 (Red)    Row  Strengthening;Both;15 reps;Theraband    Theraband Level (Shoulder Row)  Level 3 (Green)    Row Limitations  PT cued ribcage over pelvis      Shoulder Exercises: ROM/Strengthening   UBE (Upper Arm Bike)  3' 1.5' each way L2 end of session               PT Short Term Goals - 04/17/19 1107      PT SHORT TERM GOAL #1   Title  independent with initial HEP for mobility and  strengthening    Status  Achieved      PT SHORT TERM GOAL #2   Title  The patient will  have improved lumbar flexion to 60 degrees and HS muscle lengths to 70 degrees needed for improved household and community mobility    Status  Achieved      PT SHORT TERM GOAL #3   Title  The patient will be able to stand for 3-4 minutes for washing dishes at the sink    Status  Achieved      PT SHORT TERM GOAL #4   Title  The patient will report back, hip, knee and right upper arm pain about 30% improved with daily activities    Baseline  met for back/hip 30% improvement, Lt knee still ongoing pain levels    Status  Partially Met        PT Long Term Goals - 05/04/19 0943      PT LONG TERM GOAL #1   Title  independent with advanced HEP    Baseline  compliant 6 days/week, working on more dynamic exercise and standing core transition    Time  7    Period  Weeks    Status  On-going    Target Date  06/22/19      PT LONG TERM GOAL #2   Title  The patient will be able to walk 8-12 min with 50% less pain    Baseline  using better transverse plane motion with arm swing, 3 min walk test caused pain 3/10 for back and Lt knee    Time  7    Period  Weeks    Status  On-going    Target Date  06/22/19      PT LONG TERM GOAL #3   Title  The patient will have improved lumbo/pelvic/shoulder/periscapular core strength grossly 4+/5 needed for walking and standing longer periods of time    Baseline  Pt with strength ranging from 4- to 4+/5 in hip abd bil, hip flexors, knee extensors, hip rotators and trunk flexors/core, needs cueing for dynamic functional form    Time  7    Period  Weeks    Status  New    Target Date  06/22/19      PT LONG TERM GOAL #4   Title  The patient will have improved bil hip abduction/gluteal strength to 4/5 needed for standing >5 minutes    Baseline  hip strength 4-/5 to 4+/5    Time  7    Period  Weeks    Status  On-going    Target Date  06/22/19      PT LONG TERM GOAL #5    Title  FOTO functional outcome score improved from 60% limitation to 51%    Baseline  47% 05/04/19    Status  Achieved            Plan - 06/12/19 1038    Clinical Impression Statement  Pt reports she is much more tolerant of running errands and doing household tasks with less pain than prior to starting PT.  She has difficulty expressing how long or what percent improvement she feels but is pleased with her ability to tolerate standing tasks with managable pain.  She uses HEP effectively to manage lumbar symptoms.  She demo's much imrpoved flexibility, postural awareness and core strenth.  PT progressed seated hip strenth from green to blue band with Pt good demo and without substitution or compromise of form today.  She lost her balance in the yard over the weekend picking up sticks so PT discussed strategies of wide base of support squat and  stoop vs stand on one leg.  She will be ready for d/c next visit.    Comorbidities  Lumbar fusion;  left TKR with poor result; HTN; diabetes; asthma; osteopenia; restless legs    Rehab Potential  Good    PT Frequency  2x / week    PT Duration  6 weeks    PT Treatment/Interventions  ADLs/Self Care Home Management;Therapeutic exercise;Therapeutic activities;Balance training;Neuromuscular re-education;Patient/family education;Dry needling;Taping;Moist Heat;Cryotherapy;Electrical Stimulation;Ultrasound;Manual techniques;Traction    PT Next Visit Plan  finalize HEP, do FOTO, review LTGs, d/c to HEP    PT Home Exercise Plan  Access Code: [email or web address]    Consulted and Agree with Plan of Care  Patient       Patient will benefit from skilled therapeutic intervention in order to improve the following deficits and impairments:     Visit Diagnosis: Chronic bilateral low back pain without sciatica  Muscle weakness (generalized)  Chronic pain of left knee  Pain in right hip     Problem List Patient Active Problem List   Diagnosis Date Noted  . Carotid  arterial disease ([place]) 01/23/2019  . RBBB (right bundle branch block with left anterior fascicular block)   . Chest pain 01/22/2019  . Weakness of both lower extremities 01/02/2019  . Frequent falls 01/02/2019  . Cellulitis of right elbow 12/29/2018  . OSA on CPAP 06/15/2017  . Diabetes ([place]) 03/02/2017  . Herpes zoster without complication 76/73/4193  . Prediabetes 02/23/2017  . Shortness of breath on exertion 02/23/2017  . B12 nutritional deficiency 02/23/2017  . Vitamin D deficiency 12/08/2016  . Tubular adenoma of colon 04/29/2016  . Essential hypertension 09/12/2015  . Allergic rhinitis 07/17/2015  . Insomnia 07/17/2015  . Obese 04/10/2015  . S/P left TKA 04/09/2015  . Osteopenia 12/04/2012  . GERD (gastroesophageal reflux disease) 02/07/2012  . 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 RESTLESS LEG SYNDROME 02/11/2009  . LEG EDEMA, BILATERAL 02/11/2009  . Hyperlipidemia 10/30/2008  . Urinary incontinence 10/30/2008  . Skin cancer 10/30/2008  . Asthma 11/21/2007  . Venous (peripheral) insufficiency 11/22/2006   Baruch Merl, PT 06/12/19 10:59 AM   [facility] [address], [place], [place], [address] Phone: [phone]   Fax:  [phone]  Name: MARYCRUZ BOEHNER MRN: [id number] Date of Birth: Oct 01, 1940

## 2019-06-13 DIAGNOSIS — G629 Polyneuropathy, unspecified: Secondary | ICD-10-CM | POA: Diagnosis not present

## 2019-06-14 ENCOUNTER — Other Ambulatory Visit: Payer: Self-pay

## 2019-06-14 ENCOUNTER — Ambulatory Visit: Payer: Medicare PPO | Admitting: Physical Therapy

## 2019-06-14 ENCOUNTER — Encounter: Payer: Self-pay | Admitting: Physical Therapy

## 2019-06-14 DIAGNOSIS — M25562 Pain in left knee: Secondary | ICD-10-CM | POA: Diagnosis not present

## 2019-06-14 DIAGNOSIS — M545 Low back pain, unspecified: Secondary | ICD-10-CM

## 2019-06-14 DIAGNOSIS — M25551 Pain in right hip: Secondary | ICD-10-CM | POA: Diagnosis not present

## 2019-06-14 DIAGNOSIS — M6281 Muscle weakness (generalized): Secondary | ICD-10-CM | POA: Diagnosis not present

## 2019-06-14 DIAGNOSIS — G8929 Other chronic pain: Secondary | ICD-10-CM

## 2019-06-14 NOTE — Therapy (Signed)
[facility] [address], [place], [place], [address] Phone: [phone]   Fax:  [phone]  Physical Therapy Treatment  Patient Details  Name: Erin Roach MRN: [id number] Date of Birth: 1940-11-14 Referring Provider (PT): Dr. Billey Gosling   Encounter Date: 06/14/2019  PT End of Session - 06/14/19 1016    Visit Number  23    Date for PT Re-Evaluation  06/15/19    Authorization Type  UHC Medicare    PT Start Time  1012    PT Stop Time  1051    PT Time Calculation (min)  39 min    Activity Tolerance  Patient tolerated treatment well;No increased pain    Behavior During Therapy  WFL for tasks assessed/performed       Past Medical History:  Diagnosis Date  . Allergic rhinitis   . Apnea   . Arthritis   . Asthma    very mild  . Back pain   . Bilateral lower extremity edema   . Carotid artery disease ([place])    a. Carotid duplex 03/2016 - duplex was stable, 19-50% RICA, 9-32% LICA  . Complication of anesthesia    post-op delirium  . Endometrial polyp   . Essential hypertension   . GERD (gastroesophageal reflux disease)   . Heart murmur    per pt  . History of adenomatous polyp of colon    04-24-2016  tubular adenoma  . History of squamous cell carcinoma excision    pilonidal area second excision 02-02-2002  . HTN (hypertension)   . Hyperlipidemia   . Leg pain   . OAB (overactive bladder)   . RA (rheumatoid arthritis) ([place])   . RBBB (right bundle branch block with left anterior fascicular block)   . RLS (restless legs syndrome)   . Shortness of breath on exertion   . SUI (stress urinary incontinence, female)   . Venous insufficiency    legs  . Vitamin D deficiency     Past Surgical History:  Procedure Laterality Date  . APPENDECTOMY  07-02-2005   [name]   open  . CARDIAC CATHETERIZATION  01-23-2009  dr Darnell Level brodie   normal coronary arteries and lvf  . CARDIOVASCULAR STRESS TEST   03/31/2016   Low risk nuclear study w/ medium defect of mild severity in basal anterior and mid anterior location w/ no evidence ischemia or infarction/  normal LV function and wall motion,  nuclear stress ef 71%  . CATARACT EXTRACTION W/ INTRAOCULAR LENS IMPLANT Left 09/2016  . COLONOSCOPY  last one 04-24-2016  . HYSTEROSCOPY WITH D & C N/A 12/22/2016   Procedure: DILATATION AND CURETTAGE /HYSTEROSCOPY;  Surgeon: Dian Queen, MD;  Location: [place];  Service: Gynecology;  Laterality: N/A;  . IR US GUIDANCE  09/08/2016  . POSTERIOR LUMBAR FUSION  02/ 2018    [facility] ([place])  . SKIN CANCER EXCISION    . TONSILLECTOMY  child  . 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 TRANSTHORACIC ECHOCARDIOGRAM  01/21/2009   mild LVH, ef 67-12%, grade 1 diastolic dysfunction  . ULNAR NERVE TRANSPOSITION Left 12-20-2008  dr sypher   decompression and partial resection medial triceps fascia  . WIDE LOCAL EXCISION PILONIDAL AREA  02-02-2002  [name]   recurrent squamous cell carcinoma in situ    There were no  vitals filed for this visit.  Subjective Assessment - 06/14/19 1013    Subjective  I've had a very active morning at home - going up/down steps a lot.  I continue to use my HEP to help my pain when it comes on.  I can do standing activies more now than I used to with lower pain levels.    Pertinent History  "Erin Roach";  left TKR;  lumbar fusion; restless legs, HTN; diabetes;  mild neuropathy;  osteopenia    How long can you stand comfortably?  30 min    How long can you walk comfortably?  30 min    Patient Stated Goals  I would like to feel better, be more active; better quality of life    Currently in Pain?  Yes    Pain Score  3     Pain Location  Back    Pain Orientation  Right   and Lt knee   Pain Descriptors / Indicators   Aching    Pain Type  Chronic pain    Pain Onset  More than a month ago    Pain Frequency  Intermittent    Aggravating Factors   prolonged standing/walking    Pain Relieving Factors  HEP         [facility] PT Assessment - 06/14/19 0001      Assessment   Medical Diagnosis  weakness LEs; falls    Referring Provider (PT)  Dr. Billey Gosling    Onset Date/Surgical Date  08/09/18    Next MD Visit  as needed     Prior Therapy  in past for TKR;  fusion                    [facility] Adult PT Treatment/Exercise - 06/14/19 0001      Exercises   Exercises  Lumbar;Knee/Hip;Shoulder      Lumbar Exercises: Stretches   Active Hamstring Stretch  Left;Right;2 reps;20 seconds    Active Hamstring Stretch Limitations  with green strap    Lower Trunk Rotation  3 reps;20 seconds    Piriformis Stretch  Left;Right;2 reps;20 seconds      Lumbar Exercises: Aerobic   Nustep  L3 x 8', PT present to review goals      Knee/Hip Exercises: Standing   Hip Abduction  Stengthening;Both;1 set;10 reps;Knee straight    Abduction Limitations  at counter    Hip Extension  Stengthening;Both;1 set;10 reps;Knee straight    Extension Limitations  at counter      Knee/Hip Exercises: Supine   Bridges with Clamshell  Strengthening;20 reps;Both   blue   Straight Leg Raises  Strengthening;Both;2 sets;10 reps      Shoulder Exercises: Standing   Flexion  Strengthening;Both;10 reps;Theraband    Theraband Level (Shoulder Flexion)  Level 2 (Red)    Extension  Strengthening;Both;10 reps;Theraband    Theraband Level (Shoulder Extension)  Level 2 (Red)    Row  Strengthening;Both;15 reps;Theraband    Theraband Level (Shoulder Row)  Level 3 (Green)    Row Limitations  PT cued ribcage over pelvis      Shoulder Exercises: ROM/Strengthening   UBE (Upper Arm Bike)  4' 2x2 fwd/bwd each way L2               PT Short Term Goals - 04/17/19 1107      PT SHORT TERM GOAL #1   Title  independent with initial HEP for  mobility and strengthening    Status  Achieved  PT SHORT TERM GOAL #2   Title  The patient will have improved lumbar flexion to 60 degrees and HS muscle lengths to 70 degrees needed for improved household and community mobility    Status  Achieved      PT SHORT TERM GOAL #3   Title  The patient will be able to stand for 3-4 minutes for washing dishes at the sink    Status  Achieved      PT SHORT TERM GOAL #4   Title  The patient will report back, hip, knee and right upper arm pain about 30% improved with daily activities    Baseline  met for back/hip 30% improvement, Lt knee still ongoing pain levels    Status  Partially Met        PT Long Term Goals - 06/14/19 1016      PT LONG TERM GOAL #1   Title  independent with advanced HEP    Status  Achieved      PT LONG TERM GOAL #2   Title  The patient will be able to walk 8-12 min with 50% less pain    Status  Achieved      PT LONG TERM GOAL #3   Title  The patient will have improved lumbo/pelvic/shoulder/periscapular core strength grossly 4+/5 needed for walking and standing longer periods of time    Status  Achieved      PT LONG TERM GOAL #4   Title  The patient will have improved bil hip abduction/gluteal strength to 4/5 needed for standing >5 minutes    Status  Achieved      PT LONG TERM GOAL #5   Title  FOTO functional outcome score improved from 60% limitation to 51%    Status  Achieved            Plan - 06/14/19 1020    Clinical Impression Statement  Pt has come a long way in her strength, activity tolerance, postural awareness and pain management strategies.  She is confident in her HEP and uses it to reduce pain.  Pain levels range from 1-3/10 in lumbar and Lt knee pain with standing and walking activities.  She can now walk/stand for 30 min before pain increases.  Strength improved to 4+/5 throughout bil UEs and core.  She will need to continue using postural awareness to reduce overloading lumbar spine.  She  is ready to d/c to HEP.    Personal Factors and Comorbidities  Age;Past/Current Experience;Comorbidity 1;Comorbidity 2;Comorbidity 3+    Comorbidities  Lumbar fusion;  left TKR with poor result; HTN; diabetes; asthma; osteopenia; restless legs    Rehab Potential  Good    PT Frequency  2x / week    PT Duration  6 weeks    PT Treatment/Interventions  ADLs/Self Care Home Management;Therapeutic exercise;Therapeutic activities;Balance training;Neuromuscular re-education;Patient/family education;Dry needling;Taping;Moist Heat;Cryotherapy;Electrical Stimulation;Ultrasound;Manual techniques;Traction    PT Next Visit Plan  d/c to HEP    PT Home Exercise Plan  Access Code: [email or web address]    Consulted and Agree with Plan of Care  Patient       Patient will benefit from skilled therapeutic intervention in order to improve the following deficits and impairments:     Visit Diagnosis: Chronic bilateral low back pain without sciatica  Muscle weakness (generalized)  Chronic pain of left knee  Pain in right hip     Problem List Patient Active Problem List   Diagnosis Date Noted  . Carotid arterial disease ([place]) 01/23/2019  .  RBBB (right bundle branch block with left anterior fascicular block)   . Chest pain 01/22/2019  . Weakness of both lower extremities 01/02/2019  . Frequent falls 01/02/2019  . Cellulitis of right elbow 12/29/2018  . OSA on CPAP 06/15/2017  . Diabetes ([place]) 03/02/2017  . Herpes zoster without complication 81/59/4707  . Prediabetes 02/23/2017  . Shortness of breath on exertion 02/23/2017  . B12 nutritional deficiency 02/23/2017  . Vitamin D deficiency 12/08/2016  . Tubular adenoma of colon 04/29/2016  . Essential hypertension 09/12/2015  . Allergic rhinitis 07/17/2015  . Insomnia 07/17/2015  . Obese 04/10/2015  . S/P left TKA 04/09/2015  . Osteopenia 12/04/2012  . GERD (gastroesophageal reflux disease) 02/07/2012  . 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 RESTLESS LEG SYNDROME  02/11/2009  . LEG EDEMA, BILATERAL 02/11/2009  . Hyperlipidemia 10/30/2008  . Urinary incontinence 10/30/2008  . Skin cancer 10/30/2008  . Asthma 11/21/2007  . Venous (peripheral) insufficiency 11/22/2006    PHYSICAL THERAPY DISCHARGE SUMMARY  Visits from Start of Care: 23  Current functional level related to goals / functional outcomes: See above   Remaining deficits: See above   Education / Equipment: HEP Plan: Patient agrees to discharge.  Patient goals were partially met. Patient is being discharged due to meeting the stated rehab goals.  ?????         Baruch Merl, PT 06/14/19 10:49 AM   [facility] [address] [place], [place], [address] Phone: [phone]   Fax:  [phone]  Name: Erin Roach MRN: [id number] Date of Birth: October 10, 1940

## 2019-06-15 ENCOUNTER — Other Ambulatory Visit (INDEPENDENT_AMBULATORY_CARE_PROVIDER_SITE_OTHER): Payer: Self-pay | Admitting: Family Medicine

## 2019-06-15 DIAGNOSIS — E559 Vitamin D deficiency, unspecified: Secondary | ICD-10-CM

## 2019-06-16 ENCOUNTER — Ambulatory Visit: Payer: Medicare PPO

## 2019-06-17 ENCOUNTER — Other Ambulatory Visit (INDEPENDENT_AMBULATORY_CARE_PROVIDER_SITE_OTHER): Payer: Self-pay | Admitting: Family Medicine

## 2019-06-17 DIAGNOSIS — E119 Type 2 diabetes mellitus without complications: Secondary | ICD-10-CM

## 2019-06-19 ENCOUNTER — Encounter: Payer: Medicare Other | Admitting: Physical Therapy

## 2019-06-21 ENCOUNTER — Encounter: Payer: Medicare Other | Admitting: Physical Therapy

## 2019-06-21 ENCOUNTER — Other Ambulatory Visit (INDEPENDENT_AMBULATORY_CARE_PROVIDER_SITE_OTHER): Payer: Self-pay | Admitting: Family Medicine

## 2019-06-21 DIAGNOSIS — E119 Type 2 diabetes mellitus without complications: Secondary | ICD-10-CM

## 2019-06-22 ENCOUNTER — Encounter (INDEPENDENT_AMBULATORY_CARE_PROVIDER_SITE_OTHER): Payer: Self-pay | Admitting: Family Medicine

## 2019-06-22 ENCOUNTER — Encounter: Payer: Self-pay | Admitting: Internal Medicine

## 2019-06-22 DIAGNOSIS — E1129 Type 2 diabetes mellitus with other diabetic kidney complication: Secondary | ICD-10-CM

## 2019-06-22 DIAGNOSIS — G2581 Restless legs syndrome: Secondary | ICD-10-CM | POA: Diagnosis not present

## 2019-06-22 DIAGNOSIS — G4733 Obstructive sleep apnea (adult) (pediatric): Secondary | ICD-10-CM | POA: Diagnosis not present

## 2019-06-22 DIAGNOSIS — R809 Proteinuria, unspecified: Secondary | ICD-10-CM

## 2019-06-22 NOTE — Telephone Encounter (Signed)
Please advise 

## 2019-06-23 MED ORDER — FREESTYLE LIBRE 14 DAY SENSOR MISC: 5 refills | Status: DC

## 2019-06-23 MED ORDER — FREESTYLE LIBRE READER DEVI: 1.0000 | Freq: Every day | 0 refills | Status: DC

## 2019-06-26 ENCOUNTER — Ambulatory Visit (INDEPENDENT_AMBULATORY_CARE_PROVIDER_SITE_OTHER): Payer: Self-pay | Admitting: Family Medicine

## 2019-06-26 ENCOUNTER — Encounter (INDEPENDENT_AMBULATORY_CARE_PROVIDER_SITE_OTHER): Payer: Self-pay | Admitting: Family Medicine

## 2019-06-27 ENCOUNTER — Other Ambulatory Visit (INDEPENDENT_AMBULATORY_CARE_PROVIDER_SITE_OTHER): Payer: Self-pay

## 2019-06-27 DIAGNOSIS — E119 Type 2 diabetes mellitus without complications: Secondary | ICD-10-CM

## 2019-06-27 MED ORDER — METFORMIN HCL 500 MG PO TABS: ORAL_TABLET | ORAL | 0 refills | Status: DC

## 2019-06-29 ENCOUNTER — Encounter: Payer: Self-pay | Admitting: Internal Medicine

## 2019-06-29 MED ORDER — HYDROCODONE-ACETAMINOPHEN 5-325 MG PO TABS: 1.0000 | ORAL_TABLET | Freq: Every day | ORAL | 0 refills | Status: DC

## 2019-07-03 ENCOUNTER — Ambulatory Visit (INDEPENDENT_AMBULATORY_CARE_PROVIDER_SITE_OTHER): Payer: Medicare PPO | Admitting: Family Medicine

## 2019-07-03 ENCOUNTER — Other Ambulatory Visit: Payer: Self-pay

## 2019-07-03 ENCOUNTER — Encounter (INDEPENDENT_AMBULATORY_CARE_PROVIDER_SITE_OTHER): Payer: Self-pay | Admitting: Family Medicine

## 2019-07-03 VITALS — BP 134/78 | HR 93 | Temp 98.0°F | Ht 66.0 in | Wt 206.0 lb

## 2019-07-03 DIAGNOSIS — E559 Vitamin D deficiency, unspecified: Secondary | ICD-10-CM

## 2019-07-03 DIAGNOSIS — J3089 Other allergic rhinitis: Secondary | ICD-10-CM | POA: Diagnosis not present

## 2019-07-03 DIAGNOSIS — E669 Obesity, unspecified: Secondary | ICD-10-CM | POA: Diagnosis not present

## 2019-07-03 DIAGNOSIS — D508 Other iron deficiency anemias: Secondary | ICD-10-CM | POA: Diagnosis not present

## 2019-07-03 DIAGNOSIS — E7849 Other hyperlipidemia: Secondary | ICD-10-CM | POA: Diagnosis not present

## 2019-07-03 DIAGNOSIS — Z6833 Body mass index (BMI) 33.0-33.9, adult: Secondary | ICD-10-CM | POA: Diagnosis not present

## 2019-07-03 DIAGNOSIS — E119 Type 2 diabetes mellitus without complications: Secondary | ICD-10-CM

## 2019-07-03 DIAGNOSIS — E538 Deficiency of other specified B group vitamins: Secondary | ICD-10-CM | POA: Diagnosis not present

## 2019-07-03 MED ORDER — FLUTICASONE PROPIONATE 50 MCG/ACT NA SUSP: 1.0000 | Freq: Every day | NASAL | 0 refills | Status: DC | PRN

## 2019-07-03 MED ORDER — ERGOCALCIFEROL 1.25 MG (50000 UT) PO CAPS: 50000.0000 [IU] | ORAL_CAPSULE | ORAL | 0 refills | Status: DC

## 2019-07-03 MED ORDER — VICTOZA 18 MG/3ML ~~LOC~~ SOPN: PEN_INJECTOR | SUBCUTANEOUS | 0 refills | Status: DC

## 2019-07-03 NOTE — Progress Notes (Signed)
Chief Complaint:   OBESITY VERALYN YURKO is here to discuss her progress with her obesity treatment plan along with follow-up of her obesity related diagnoses. Anandi is keeping a food journal and adhering to recommended goals of 1500 calories and 85 grams of protein and states she is following her eating plan approximately 60% of the time. 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  Today's visit was #: 55  Starting weight: 223 lbs Starting date: 02/23/2017 Today's weight: 206 lbs Today's date: 07/03/2019 Total lbs lost to date: 17  Total lbs lost since last in-office visit: 0  Interim History: Alyne has finished PT and is exercising at home most days. She isn't journaling as much and mostly is doing PC/[place]. She is trying to increase her protein, but may not always be meeting her goal.  Subjective:   B12 deficiency. Daesha is on metformin and has RLS, which puts her at increased risk of B12 deficiency.  Lab Results  Component Value Date   VITAMINB12 567 12/13/2018    Vitamin D deficiency. Jennaveve is stable on Vitamin D and is due for labs. Last Vitamin D level 52.4 on 12/13/2018.  Other hyperlipidemia. Tawsha's last triglycerides were increased. No chest pain. She is working on her diet.  Lab Results  Component Value Date   CHOL 192 01/23/2019   HDL 45 01/23/2019   LDLCALC 91 01/23/2019   LDLDIRECT 141.3 09/06/2012   TRIG 279 (H) 01/23/2019   CHOLHDL 4.3 01/23/2019   Lab Results  Component Value Date   ALT 36 01/22/2019   AST 26 01/22/2019   ALKPHOS 73 01/22/2019   BILITOT 0.5 01/22/2019   The 10-year ASCVD risk score Mikey Bussing DC Jr., et al., 2013) is: 42.6%   Values used to calculate the score:     Age: 29 years     Sex: Female     Is Non-Hispanic African American: No     Diabetic: Yes     Tobacco smoker: No     Systolic Blood Pressure: [id number] mmHg     Is BP treated: Yes     HDL Cholesterol: 45 mg/dL     Total Cholesterol: 192  mg/dL  Type 2 diabetes mellitus without complication, without long-term current use of insulin ([place]). Zahriyah is stable on her medications and is due for labs. She is working on diet and exercise.  Lab Results  Component Value Date   HGBA1C 5.4 12/13/2018   HGBA1C 5.7 (H) 04/20/2018   HGBA1C 5.3 01/06/2018   Lab Results  Component Value Date   LDLCALC 91 01/23/2019   CREATININE 0.86 01/23/2019   Lab Results  Component Value Date   INSULIN 13.3 12/13/2018   INSULIN 27.9 (H) 04/20/2018   INSULIN 24.6 01/06/2018   INSULIN 17.0 09/28/2017   INSULIN 28.3 (H) 06/09/2017   Other iron deficiency anemia. Arliss notes worsening RLS and has a history of decreased hemoglobin.   CBC Latest Ref Rng & Units 01/23/2019 01/22/2019 09/28/2017  WBC 4.0 - 10.5 K/uL 6.2 7.8 6.2  Hemoglobin 12.0 - 15.0 g/dL 11.0(L) 11.0(L) 11.8  Hematocrit 36.0 - 46.0 % 32.8(L) 32.9(L) 35.4  Platelets 150 - 400 K/uL 250 264 -   Lab Results  Component Value Date   IRON 109 03/02/2017   TIBC 333 03/19/2015   FERRITIN 218.0 03/02/2017   Lab Results  Component Value Date   VITAMINB12 567 12/13/2018   Allergic rhinitis due to other allergic trigger, unspecified  seasonality. Annalina is stable on Flonase and requests a refill.  Assessment/Plan:   B12 deficiency. The diagnosis was reviewed with the patient. Counseling provided today, see below. We will continue to monitor. Orders and follow up as documented in patient record. Vitamin B12 level ordered.  Counseling . The body needs vitamin B12: to make red blood cells; to make DNA; and to help the nerves work properly so they can carry messages from the brain to the body.  . The main causes of vitamin B12 deficiency include dietary deficiency, digestive diseases, pernicious anemia, and having a surgery in which part of the stomach or small intestine is removed.  . Certain medicines can make it harder for the body to absorb vitamin B12. These medicines include: heartburn  medications; some antibiotics; some medications used to treat diabetes, gout, and high cholesterol.  . In some cases, there are no symptoms of this condition. If the condition leads to anemia or nerve damage, various symptoms can occur, such as weakness or fatigue, shortness of breath, and numbness or tingling in your hands and feet.   . Treatment:  o May include taking vitamin B12 supplements.  o Avoid alcohol.  o Eat lots of healthy foods that contain vitamin B12: - Beef, pork, chicken, [place], and organ meats, such as liver.  - Seafood: This includes clams, rainbow trout, salmon, tuna, and haddock.  - Eggs.  - Cereal and dairy products that are fortified: This means that vitamin B12 has been added to the food.      Vitamin D deficiency. Low Vitamin D level contributes to fatigue and are associated with obesity, breast, and colon cancer. She was given a prescription for ergocalciferol (VITAMIN D2) 1.25 MG (50000 UT) capsule every week #4 with 0 refills. VITAMIN D 25 Hydroxy (Vit-D Deficiency, Fractures) level ordered today.   Other hyperlipidemia. Cardiovascular risk and specific lipid/LDL goals reviewed.  We discussed several lifestyle modifications today and Lafaye will continue to work on diet, exercise and weight loss efforts. Orders and follow up as documented in patient record. Lipid Panel With LDL/HDL Ratio ordered today.  Counseling Intensive lifestyle modifications are the first line treatment for this issue. . Dietary changes: Increase soluble fiber. Decrease simple carbohydrates. . Exercise changes: Moderate to vigorous-intensity aerobic activity 150 minutes per week if tolerated. . Lipid-lowering medications: see documented in medical record.     Type 2 diabetes mellitus without complication, without long-term current use of insulin ([place]). Good blood sugar control is important to decrease the likelihood of diabetic complications such as nephropathy, neuropathy, limb loss, blindness,  coronary artery disease, and death. Intensive lifestyle modification including diet, exercise and weight loss are the first line of treatment for diabetes. Camri was given a refill on her liraglutide (VICTOZA) 18 MG/3ML SOPN #3 pens with 0 refills. Comprehensive metabolic panel, CBC with Differential/Platelet, Hemoglobin A1c, Insulin, random labs ordered.  Other iron deficiency anemia. Orders and follow up as documented in patient record. Labs ordered today.  Counseling . Iron is essential for our bodies to make red blood cells.  Reasons that someone may be deficient include: an iron-deficient diet (more likely in those following vegan or vegetarian diets), women with heavy menses, patients with GI disorders or poor absorption, patients that have had bariatric surgery, frequent blood donors, patients with cancer, and patients with heart disease.   [name] An iron supplement has been recommended. This is found over-the-counter.   M[place] Noble foods include dark leafy greens, red and white meats, eggs, seafood,  and beans.   . Certain foods and drinks prevent your body from absorbing iron properly. Avoid eating these foods in the same meal as iron-rich foods or with iron supplements. These foods include: coffee, black tea, and red wine; milk, dairy products, and foods that are high in calcium; beans and soybeans; whole grains.  . Constipation can be a side effect of iron supplementation. Increased water and fiber intake are helpful. Water goal: > 2 liters/day. Fiber goal: > 25 grams/day.  Allergic rhinitis due to other allergic trigger, unspecified seasonality. Tura was given a refill on her fluticasone (FLONASE) 50 MCG/ACT nasal spray.   Class 1 obesity with serious comorbidity and body mass index (BMI) of 33.0 to 33.9 in adult, unspecified obesity type.  Shawnell is currently in the action stage of change. As such, her goal is to continue with weight loss efforts. She has agreed to keeping a food journal and  adhering to recommended goals of 1500 calories and 85 grams of protein.   Exercise goals: Older adults should follow the adult guidelines. When older adults cannot meet the adult guidelines, they should be as physically active as their abilities and conditions will allow.   Behavioral modification strategies: keeping a strict food journal.  Larissa has agreed to follow-up with our clinic in 4 weeks. She was informed of the importance of frequent follow-up visits to maximize her success with intensive lifestyle modifications for her multiple health conditions.   Vanesa was informed we would discuss her lab results at her next visit unless there is a critical issue that needs to be addressed sooner. Esma agreed to keep her next visit at the agreed upon time to discuss these results.  Objective:   Blood pressure 134/78, pulse 93, temperature 98 F (36.7 C), temperature source Oral, height 5\' 6"  (1.676 m), weight 206 lb (93.4 kg), SpO2 98 %. Body mass index is 33.25 kg/m.  General: Cooperative, alert, well developed, in no acute distress. HEENT: Conjunctivae and lids unremarkable. Cardiovascular: Regular rhythm.  Lungs: Normal work of breathing. Neurologic: No focal deficits.   Lab Results  Component Value Date   CREATININE 0.86 01/23/2019   BUN 16 01/23/2019   NA 140 01/23/2019   K 4.0 01/23/2019   CL 105 01/23/2019   CO2 25 01/23/2019   Lab Results  Component Value Date   ALT 36 01/22/2019   AST 26 01/22/2019   ALKPHOS 73 01/22/2019   BILITOT 0.5 01/22/2019   Lab Results  Component Value Date   HGBA1C 5.4 12/13/2018   HGBA1C 5.7 (H) 04/20/2018   HGBA1C 5.3 01/06/2018   HGBA1C 5.4 09/28/2017   HGBA1C 5.5 06/09/2017   Lab Results  Component Value Date   INSULIN 13.3 12/13/2018   INSULIN 27.9 (H) 04/20/2018   INSULIN 24.6 01/06/2018   INSULIN 17.0 09/28/2017   INSULIN 28.3 (H) 06/09/2017   Lab Results  Component Value Date   TSH 5.130 (H) 12/13/2018   Lab Results    Component Value Date   CHOL 192 01/23/2019   HDL 45 01/23/2019   LDLCALC 91 01/23/2019   LDLDIRECT 141.3 09/06/2012   TRIG 279 (H) 01/23/2019   CHOLHDL 4.3 01/23/2019   Lab Results  Component Value Date   WBC 6.2 01/23/2019   HGB 11.0 (L) 01/23/2019   HCT 32.8 (L) 01/23/2019   MCV 89.6 01/23/2019   PLT 250 01/23/2019   Lab Results  Component Value Date   IRON 109 03/02/2017   TIBC 333 03/19/2015  FERRITIN 218.0 03/02/2017   Obesity Behavioral Intervention Documentation for Insurance:   Up to 15 minutes were spent on the discussion below.  ASK: We discussed the diagnosis of obesity with [name] today and Persayis agreed to give [name] permission to discuss obesity behavioral modification therapy today.  ASSESS: Ulrike has the diagnosis of obesity and her BMI today is 33.4. Vann is in the action stage of change.   ADVISE: Alexis was educated on the multiple health risks of obesity as well as the benefit of weight loss to improve her health. She was advised of the need for long term treatment and the importance of lifestyle modifications to improve her current health and to decrease her risk of future health problems.  AGREE: Multiple dietary modification options and treatment options were discussed and Anala agreed to follow the recommendations documented in the above note.  ARRANGE: Amaani was educated on the importance of frequent visits to treat obesity as outlined per CMS and USPSTF guidelines and agreed to schedule her next follow up appointment today.  Attestation Statements:   Reviewed by clinician on day of visit: allergies, medications, problem list, medical history, surgical history, family history, social history, and previous encounter notes.  I, Michaelene Song, am acting as Location manager for Dennard Nip, MD   I have reviewed the above documentation for accuracy and completeness, and I agree with the above. -  Dennard Nip, MD

## 2019-07-04 LAB — COMPREHENSIVE METABOLIC PANEL
ALT: 43 IU/L — ABNORMAL HIGH (ref 0–32)
AST: 26 IU/L (ref 0–40)
Albumin/Globulin Ratio: 1.6 (ref 1.2–2.2)
Albumin: 4 g/dL (ref 3.7–4.7)
Alkaline Phosphatase: 94 IU/L (ref 39–117)
BUN/Creatinine Ratio: 15 (ref 12–28)
BUN: 16 mg/dL (ref 8–27)
Bilirubin Total: 0.3 mg/dL (ref 0.0–1.2)
CO2: 21 mmol/L (ref 20–29)
Calcium: 8.9 mg/dL (ref 8.7–10.3)
Chloride: 103 mmol/L (ref 96–106)
Creatinine, Ser: 1.04 mg/dL — ABNORMAL HIGH (ref 0.57–1.00)
GFR calc Af Amer: 60 mL/min/{1.73_m2} (ref >59)
GFR calc non Af Amer: 52 mL/min/{1.73_m2} — ABNORMAL LOW (ref >59)
Globulin, Total: 2.5 g/dL (ref 1.5–4.5)
Glucose: 110 mg/dL — ABNORMAL HIGH (ref 65–99)
Potassium: 4.7 mmol/L (ref 3.5–5.2)
Sodium: 139 mmol/L (ref 134–144)
Total Protein: 6.5 g/dL (ref 6.0–8.5)

## 2019-07-04 LAB — CBC WITH DIFFERENTIAL/PLATELET
Basophils Absolute: 0.1 10*3/uL (ref 0.0–0.2)
Basos: 1 %
EOS (ABSOLUTE): 0.3 10*3/uL (ref 0.0–0.4)
Eos: 4 %
Hematocrit: 33.2 % — ABNORMAL LOW (ref 34.0–46.6)
Hemoglobin: 11.3 g/dL (ref 11.1–15.9)
Immature Grans (Abs): 0 10*3/uL (ref 0.0–0.1)
Immature Granulocytes: 0 %
Lymphocytes Absolute: 2.1 10*3/uL (ref 0.7–3.1)
Lymphs: 31 %
MCH: 30.1 pg (ref 26.6–33.0)
MCHC: 34 g/dL (ref 31.5–35.7)
MCV: 89 fL (ref 79–97)
Monocytes Absolute: 0.6 10*3/uL (ref 0.1–0.9)
Monocytes: 9 %
Neutrophils Absolute: 3.7 10*3/uL (ref 1.4–7.0)
Neutrophils: 55 %
Platelets: 262 10*3/uL (ref 150–450)
RBC: 3.75 x10E6/uL — ABNORMAL LOW (ref 3.77–5.28)
RDW: 13 % (ref 11.7–15.4)
WBC: 6.9 10*3/uL (ref 3.4–10.8)

## 2019-07-04 LAB — VITAMIN B12: Vitamin B-12: 461 pg/mL (ref 232–1245)

## 2019-07-04 LAB — HEMOGLOBIN A1C
Est. average glucose Bld gHb Est-mCnc: 123 mg/dL
Hgb A1c MFr Bld: 5.9 % — ABNORMAL HIGH (ref 4.8–5.6)

## 2019-07-04 LAB — LIPID PANEL WITH LDL/HDL RATIO
Cholesterol, Total: 199 mg/dL (ref 100–199)
HDL: 53 mg/dL (ref >39)
LDL Chol Calc (NIH): 109 mg/dL — ABNORMAL HIGH (ref 0–99)
LDL/HDL Ratio: 2.1 ratio (ref 0.0–3.2)
Triglycerides: 217 mg/dL — ABNORMAL HIGH (ref 0–149)
VLDL Cholesterol Cal: 37 mg/dL (ref 5–40)

## 2019-07-04 LAB — INSULIN, RANDOM: INSULIN: 27.7 u[IU]/mL — ABNORMAL HIGH (ref 2.6–24.9)

## 2019-07-04 LAB — VITAMIN D 25 HYDROXY (VIT D DEFICIENCY, FRACTURES): Vit D, 25-Hydroxy: 33.2 ng/mL (ref 30.0–100.0)

## 2019-07-05 ENCOUNTER — Other Ambulatory Visit (INDEPENDENT_AMBULATORY_CARE_PROVIDER_SITE_OTHER): Payer: Self-pay | Admitting: Family Medicine

## 2019-07-05 DIAGNOSIS — E119 Type 2 diabetes mellitus without complications: Secondary | ICD-10-CM

## 2019-07-10 DIAGNOSIS — M5416 Radiculopathy, lumbar region: Secondary | ICD-10-CM | POA: Diagnosis not present

## 2019-07-11 ENCOUNTER — Other Ambulatory Visit (INDEPENDENT_AMBULATORY_CARE_PROVIDER_SITE_OTHER): Payer: Self-pay

## 2019-07-11 ENCOUNTER — Encounter (INDEPENDENT_AMBULATORY_CARE_PROVIDER_SITE_OTHER): Payer: Self-pay | Admitting: Family Medicine

## 2019-07-11 DIAGNOSIS — E119 Type 2 diabetes mellitus without complications: Secondary | ICD-10-CM

## 2019-07-11 MED ORDER — ACCU-CHEK AVIVA PLUS W/DEVICE KIT: 1.0000 | PACK | Freq: Four times a day (QID) | 0 refills | Status: DC

## 2019-07-15 ENCOUNTER — Other Ambulatory Visit: Payer: Self-pay | Admitting: Internal Medicine

## 2019-07-19 ENCOUNTER — Other Ambulatory Visit (INDEPENDENT_AMBULATORY_CARE_PROVIDER_SITE_OTHER): Payer: Self-pay | Admitting: Family Medicine

## 2019-07-19 DIAGNOSIS — E119 Type 2 diabetes mellitus without complications: Secondary | ICD-10-CM

## 2019-07-20 DIAGNOSIS — Z6834 Body mass index (BMI) 34.0-34.9, adult: Secondary | ICD-10-CM | POA: Diagnosis not present

## 2019-07-20 DIAGNOSIS — Z124 Encounter for screening for malignant neoplasm of cervix: Secondary | ICD-10-CM | POA: Diagnosis not present

## 2019-07-23 ENCOUNTER — Other Ambulatory Visit (INDEPENDENT_AMBULATORY_CARE_PROVIDER_SITE_OTHER): Payer: Self-pay | Admitting: Family Medicine

## 2019-07-23 DIAGNOSIS — E559 Vitamin D deficiency, unspecified: Secondary | ICD-10-CM

## 2019-07-23 DIAGNOSIS — E119 Type 2 diabetes mellitus without complications: Secondary | ICD-10-CM

## 2019-07-24 ENCOUNTER — Encounter: Payer: Self-pay | Admitting: Internal Medicine

## 2019-07-24 ENCOUNTER — Encounter (INDEPENDENT_AMBULATORY_CARE_PROVIDER_SITE_OTHER): Payer: Self-pay | Admitting: Family Medicine

## 2019-07-24 ENCOUNTER — Other Ambulatory Visit: Payer: Self-pay | Admitting: Internal Medicine

## 2019-07-24 DIAGNOSIS — E1129 Type 2 diabetes mellitus with other diabetic kidney complication: Secondary | ICD-10-CM

## 2019-07-24 DIAGNOSIS — R809 Proteinuria, unspecified: Secondary | ICD-10-CM

## 2019-07-25 ENCOUNTER — Other Ambulatory Visit (INDEPENDENT_AMBULATORY_CARE_PROVIDER_SITE_OTHER): Payer: Self-pay | Admitting: Family Medicine

## 2019-07-25 DIAGNOSIS — J3089 Other allergic rhinitis: Secondary | ICD-10-CM

## 2019-07-25 MED ORDER — PREGABALIN 75 MG PO CAPS: ORAL_CAPSULE | ORAL | 0 refills | Status: DC

## 2019-07-25 NOTE — Telephone Encounter (Signed)
Last OV 03/06/19 Next OV 09/04/19 Last RF 07/01/19

## 2019-07-26 ENCOUNTER — Other Ambulatory Visit: Payer: Self-pay

## 2019-07-26 ENCOUNTER — Encounter (INDEPENDENT_AMBULATORY_CARE_PROVIDER_SITE_OTHER): Payer: Self-pay | Admitting: Family Medicine

## 2019-07-26 ENCOUNTER — Ambulatory Visit (INDEPENDENT_AMBULATORY_CARE_PROVIDER_SITE_OTHER): Payer: Medicare PPO | Admitting: Family Medicine

## 2019-07-26 VITALS — BP 138/83 | HR 109 | Temp 98.8°F | Ht 66.0 in | Wt 203.0 lb

## 2019-07-26 DIAGNOSIS — N189 Chronic kidney disease, unspecified: Secondary | ICD-10-CM

## 2019-07-26 DIAGNOSIS — Z6832 Body mass index (BMI) 32.0-32.9, adult: Secondary | ICD-10-CM

## 2019-07-26 DIAGNOSIS — E669 Obesity, unspecified: Secondary | ICD-10-CM

## 2019-07-26 DIAGNOSIS — E119 Type 2 diabetes mellitus without complications: Secondary | ICD-10-CM

## 2019-07-26 DIAGNOSIS — E559 Vitamin D deficiency, unspecified: Secondary | ICD-10-CM | POA: Diagnosis not present

## 2019-07-27 ENCOUNTER — Encounter: Payer: Self-pay | Admitting: Internal Medicine

## 2019-07-27 MED ORDER — HYDROCODONE-ACETAMINOPHEN 5-325 MG PO TABS: 1.0000 | ORAL_TABLET | Freq: Every day | ORAL | 0 refills | Status: DC

## 2019-07-27 MED ORDER — METFORMIN HCL 500 MG PO TABS: ORAL_TABLET | ORAL | 0 refills | Status: DC

## 2019-07-27 MED ORDER — VICTOZA 18 MG/3ML ~~LOC~~ SOPN: PEN_INJECTOR | SUBCUTANEOUS | 0 refills | Status: DC

## 2019-07-27 MED ORDER — ERGOCALCIFEROL 1.25 MG (50000 UT) PO CAPS: 50000.0000 [IU] | ORAL_CAPSULE | ORAL | 0 refills | Status: DC

## 2019-07-27 NOTE — Telephone Encounter (Signed)
Check  registry last filled 06/29/2019.Marland KitchenJohny Roach

## 2019-07-27 NOTE — Progress Notes (Signed)
Chief Complaint:   OBESITY Erin Roach is here to discuss her progress with her obesity treatment plan along with follow-up of her obesity related diagnoses. Erin Roach is on keeping a food journal and adhering to recommended goals of 1500 calories and 85 grams of protein daily and states she is following her eating plan approximately 80% of the time. Erin Roach states she is doing physical therapy exercises and walking for 20-40 minutes 4 times per week.  Today's visit was #: 39 Starting weight: 223 lbs Starting date: 02/23/2017 Today's weight: 203 lbs Today's date: 07/26/2019 Total lbs lost to date: 20 Total lbs lost since last in-office visit: 3  Interim History: Erin Roach has done well with weight loss. She is working on journaling more and increasing activity. Her hunger is controlled now that she has gotten back on track.  Subjective:   1. Chronic renal failure, unspecified CKD stage Erin Roach has a new diagnosis of chronic renal failure. Her GFR is <60 for the first time. She is on daily NSAIDS and Lasix, and she is not drinking much water. I discussed labs with the patient today.  2. Type 2 diabetes mellitus without complication, without long-term current use of insulin (HCC) Erin Roach's A1c is worsening and has slightly increased to 5.9, which is still well controlled but higher than it has been in years. She had gotten off track with her diet but she is doing better now. I discussed labs with the patient today.  3. Vitamin D deficiency Pauls's Vit D level is worsening and had decreased over the Winter. She is taking Vit D prescription regularly. I discussed labs with the patient today.  Assessment/Plan:   1. Chronic renal failure, unspecified CKD stage Aseel is to increase her water intake and decrease her NSAIDS use. We will recheck labs in 3 months.  2. Type 2 diabetes mellitus without complication, without long-term current use of insulin (HCC) Good blood sugar control is important to  decrease the likelihood of diabetic complications such as nephropathy, neuropathy, limb loss, blindness, coronary artery disease, and death. Intensive lifestyle modification including diet, exercise and weight loss are the first line of treatment for diabetes. We will refill Victoza and metformin for 90 days with no refills.  - liraglutide (VICTOZA) 18 MG/3ML SOPN; DIAL AND INJECT SUBCUTANEOUSLY 1.8MG  DAILY EVERY MORNING  Dispense: 9 pen; Refill: 0  3. Vitamin D deficiency Low Vitamin D level contributes to fatigue and are associated with obesity, breast, and colon cancer. We will refill prescription Vitamin D for 90 days with no refills. Jacoba will follow-up for routine testing of Vitamin D, at least 2-3 times per year to avoid over-replacement. We will recheck labs in 3 months.  - ergocalciferol (VITAMIN D2) 1.25 MG (50000 UT) capsule; Take 1 capsule (50,000 Units total) by mouth once a week. Take one tablet by mouth on Mondays  Dispense: 12 capsule; Refill: 0  4. Class 1 obesity with serious comorbidity and body mass index (BMI) of 32.0 to 32.9 in adult, unspecified obesity type Erin Roach is currently in the action stage of change. As such, her goal is to continue with weight loss efforts. She has agreed to keeping a food journal and adhering to recommended goals of 1500 calories and 85+ grams of protein daily.   Exercise goals: As is.  Behavioral modification strategies: increasing water intake and keeping a strict food journal.  Erin Roach has agreed to follow-up with our clinic in 4 weeks. She was informed of the importance of frequent follow-up  visits to maximize her success with intensive lifestyle modifications for her multiple health conditions.   Objective:   Blood pressure 138/83, pulse (!) 109, temperature 98.8 F (37.1 C), temperature source Oral, height 5\' 6"  (1.676 m), weight 203 lb (92.1 kg), SpO2 98 %. Body mass index is 32.77 kg/m.  General: Cooperative, alert, well developed, in  no acute distress. HEENT: Conjunctivae and lids unremarkable. Cardiovascular: Regular rhythm.  Lungs: Normal work of breathing. Neurologic: No focal deficits.   Lab Results  Component Value Date   CREATININE 1.04 (H) 07/03/2019   BUN 16 07/03/2019   NA 139 07/03/2019   K 4.7 07/03/2019   CL 103 07/03/2019   CO2 21 07/03/2019   Lab Results  Component Value Date   ALT 43 (H) 07/03/2019   AST 26 07/03/2019   ALKPHOS 94 07/03/2019   BILITOT 0.3 07/03/2019   Lab Results  Component Value Date   HGBA1C 5.9 (H) 07/03/2019   HGBA1C 5.4 12/13/2018   HGBA1C 5.7 (H) 04/20/2018   HGBA1C 5.3 01/06/2018   HGBA1C 5.4 09/28/2017   Lab Results  Component Value Date   INSULIN 27.7 (H) 07/03/2019   INSULIN 13.3 12/13/2018   INSULIN 27.9 (H) 04/20/2018   INSULIN 24.6 01/06/2018   INSULIN 17.0 09/28/2017   Lab Results  Component Value Date   TSH 5.130 (H) 12/13/2018   Lab Results  Component Value Date   CHOL 199 07/03/2019   HDL 53 07/03/2019   LDLCALC 109 (H) 07/03/2019   LDLDIRECT 141.3 09/06/2012   TRIG 217 (H) 07/03/2019   CHOLHDL 4.3 01/23/2019   Lab Results  Component Value Date   WBC 6.9 07/03/2019   HGB 11.3 07/03/2019   HCT 33.2 (L) 07/03/2019   MCV 89 07/03/2019   PLT 262 07/03/2019   Lab Results  Component Value Date   IRON 109 03/02/2017   TIBC 333 03/19/2015   FERRITIN 218.0 03/02/2017    Obesity Behavioral Intervention Documentation for Insurance:   Approximately 15 minutes were spent on the discussion below.  ASK: We discussed the diagnosis of obesity with [name] today and [name] agreed to give [name] permission to discuss obesity behavioral modification therapy today.  ASSESS: Erin Roach has the diagnosis of obesity and her BMI today is 32.78. Erin Roach is in the action stage of change.   ADVISE: Erin Roach was educated on the multiple health risks of obesity as well as the benefit of weight loss to improve her health. She was advised of the need for long term  treatment and the importance of lifestyle modifications to improve her current health and to decrease her risk of future health problems.  AGREE: Multiple dietary modification options and treatment options were discussed and Adine agreed to follow the recommendations documented in the above note.  ARRANGE: Rilya was educated on the importance of frequent visits to treat obesity as outlined per CMS and USPSTF guidelines and agreed to schedule her next follow up appointment today.  Attestation Statements:   Reviewed by clinician on day of visit: allergies, medications, problem list, medical history, surgical history, family history, social history, and previous encounter notes.   I, Trixie Dredge, am acting as transcriptionist for Dennard Nip, MD.  I have reviewed the above documentation for accuracy and completeness, and I agree with the above. -  Dennard Nip, MD

## 2019-07-28 ENCOUNTER — Other Ambulatory Visit (INDEPENDENT_AMBULATORY_CARE_PROVIDER_SITE_OTHER): Payer: Self-pay | Admitting: Family Medicine

## 2019-07-28 DIAGNOSIS — E119 Type 2 diabetes mellitus without complications: Secondary | ICD-10-CM

## 2019-07-28 DIAGNOSIS — E559 Vitamin D deficiency, unspecified: Secondary | ICD-10-CM

## 2019-08-02 ENCOUNTER — Ambulatory Visit (INDEPENDENT_AMBULATORY_CARE_PROVIDER_SITE_OTHER): Payer: Medicare PPO | Admitting: Family Medicine

## 2019-08-02 ENCOUNTER — Other Ambulatory Visit (INDEPENDENT_AMBULATORY_CARE_PROVIDER_SITE_OTHER): Payer: Self-pay | Admitting: Family Medicine

## 2019-08-02 DIAGNOSIS — E119 Type 2 diabetes mellitus without complications: Secondary | ICD-10-CM

## 2019-08-09 DIAGNOSIS — M47816 Spondylosis without myelopathy or radiculopathy, lumbar region: Secondary | ICD-10-CM | POA: Diagnosis not present

## 2019-08-09 DIAGNOSIS — M5416 Radiculopathy, lumbar region: Secondary | ICD-10-CM | POA: Diagnosis not present

## 2019-08-09 DIAGNOSIS — M48061 Spinal stenosis, lumbar region without neurogenic claudication: Secondary | ICD-10-CM | POA: Diagnosis not present

## 2019-08-14 DIAGNOSIS — M545 Low back pain: Secondary | ICD-10-CM | POA: Diagnosis not present

## 2019-08-15 ENCOUNTER — Other Ambulatory Visit (INDEPENDENT_AMBULATORY_CARE_PROVIDER_SITE_OTHER): Payer: Self-pay

## 2019-08-15 ENCOUNTER — Encounter (INDEPENDENT_AMBULATORY_CARE_PROVIDER_SITE_OTHER): Payer: Self-pay | Admitting: Family Medicine

## 2019-08-15 DIAGNOSIS — E119 Type 2 diabetes mellitus without complications: Secondary | ICD-10-CM

## 2019-08-15 MED ORDER — BD PEN NEEDLE NANO 2ND GEN 32G X 4 MM MISC: 1.0000 | Freq: Two times a day (BID) | 0 refills | Status: DC

## 2019-08-15 NOTE — Telephone Encounter (Signed)
FYI,  We will send the pen needles in for her today.

## 2019-08-15 NOTE — Telephone Encounter (Signed)
Completed.

## 2019-08-15 NOTE — Telephone Encounter (Signed)
Ok to refill x 90 days

## 2019-08-18 ENCOUNTER — Encounter: Payer: Self-pay | Admitting: Internal Medicine

## 2019-08-20 MED ORDER — HYDROCODONE-ACETAMINOPHEN 5-325 MG PO TABS: 1.0000 | ORAL_TABLET | Freq: Every day | ORAL | 0 refills | Status: DC

## 2019-08-23 DIAGNOSIS — M47816 Spondylosis without myelopathy or radiculopathy, lumbar region: Secondary | ICD-10-CM | POA: Diagnosis not present

## 2019-08-24 DIAGNOSIS — M7130 Other bursal cyst, unspecified site: Secondary | ICD-10-CM | POA: Diagnosis not present

## 2019-08-24 DIAGNOSIS — M47897 Other spondylosis, lumbosacral region: Secondary | ICD-10-CM | POA: Diagnosis not present

## 2019-08-24 DIAGNOSIS — M48062 Spinal stenosis, lumbar region with neurogenic claudication: Secondary | ICD-10-CM | POA: Diagnosis not present

## 2019-08-24 DIAGNOSIS — I10 Essential (primary) hypertension: Secondary | ICD-10-CM | POA: Diagnosis not present

## 2019-08-24 DIAGNOSIS — I452 Bifascicular block: Secondary | ICD-10-CM | POA: Diagnosis not present

## 2019-08-24 DIAGNOSIS — M7138 Other bursal cyst, other site: Secondary | ICD-10-CM | POA: Diagnosis not present

## 2019-08-24 DIAGNOSIS — R000 Tachycardia, unspecified: Secondary | ICD-10-CM | POA: Diagnosis not present

## 2019-08-24 DIAGNOSIS — Z01810 Encounter for preprocedural cardiovascular examination: Secondary | ICD-10-CM | POA: Diagnosis not present

## 2019-08-24 DIAGNOSIS — M5416 Radiculopathy, lumbar region: Secondary | ICD-10-CM | POA: Diagnosis not present

## 2019-08-24 DIAGNOSIS — Z981 Arthrodesis status: Secondary | ICD-10-CM | POA: Diagnosis not present

## 2019-08-24 DIAGNOSIS — M4316 Spondylolisthesis, lumbar region: Secondary | ICD-10-CM | POA: Diagnosis not present

## 2019-08-24 DIAGNOSIS — E119 Type 2 diabetes mellitus without complications: Secondary | ICD-10-CM | POA: Diagnosis not present

## 2019-08-24 DIAGNOSIS — R9431 Abnormal electrocardiogram [ECG] [EKG]: Secondary | ICD-10-CM | POA: Diagnosis not present

## 2019-08-24 DIAGNOSIS — G2581 Restless legs syndrome: Secondary | ICD-10-CM | POA: Diagnosis not present

## 2019-08-24 DIAGNOSIS — E785 Hyperlipidemia, unspecified: Secondary | ICD-10-CM | POA: Diagnosis not present

## 2019-08-24 DIAGNOSIS — Z7984 Long term (current) use of oral hypoglycemic drugs: Secondary | ICD-10-CM | POA: Diagnosis not present

## 2019-08-24 DIAGNOSIS — M4317 Spondylolisthesis, lumbosacral region: Secondary | ICD-10-CM | POA: Diagnosis not present

## 2019-08-24 DIAGNOSIS — Z96652 Presence of left artificial knee joint: Secondary | ICD-10-CM | POA: Diagnosis not present

## 2019-08-24 DIAGNOSIS — E1151 Type 2 diabetes mellitus with diabetic peripheral angiopathy without gangrene: Secondary | ICD-10-CM | POA: Diagnosis not present

## 2019-08-24 DIAGNOSIS — G4733 Obstructive sleep apnea (adult) (pediatric): Secondary | ICD-10-CM | POA: Diagnosis not present

## 2019-08-25 ENCOUNTER — Other Ambulatory Visit: Payer: Self-pay

## 2019-08-25 MED ORDER — LOSARTAN POTASSIUM 100 MG PO TABS: ORAL_TABLET | ORAL | 1 refills | Status: DC

## 2019-08-28 ENCOUNTER — Encounter: Payer: Self-pay | Admitting: Internal Medicine

## 2019-08-28 ENCOUNTER — Other Ambulatory Visit: Payer: Self-pay | Admitting: Internal Medicine

## 2019-08-29 MED ORDER — HYDROCODONE-ACETAMINOPHEN 5-325 MG PO TABS: 1.0000 | ORAL_TABLET | Freq: Every day | ORAL | 0 refills | Status: DC

## 2019-08-29 NOTE — Telephone Encounter (Signed)
Check  registry last filled 07/27/2019.Marland KitchenJohny Roach

## 2019-08-30 ENCOUNTER — Ambulatory Visit (INDEPENDENT_AMBULATORY_CARE_PROVIDER_SITE_OTHER): Payer: Medicare PPO | Admitting: Family Medicine

## 2019-09-04 ENCOUNTER — Ambulatory Visit: Payer: Medicare Other | Admitting: Internal Medicine

## 2019-09-05 DIAGNOSIS — M545 Low back pain: Secondary | ICD-10-CM | POA: Diagnosis not present

## 2019-09-10 ENCOUNTER — Encounter (INDEPENDENT_AMBULATORY_CARE_PROVIDER_SITE_OTHER): Payer: Self-pay | Admitting: Family Medicine

## 2019-09-12 ENCOUNTER — Other Ambulatory Visit (INDEPENDENT_AMBULATORY_CARE_PROVIDER_SITE_OTHER): Payer: Self-pay

## 2019-09-12 DIAGNOSIS — E119 Type 2 diabetes mellitus without complications: Secondary | ICD-10-CM

## 2019-09-12 MED ORDER — METFORMIN HCL 500 MG PO TABS: ORAL_TABLET | ORAL | 0 refills | Status: DC

## 2019-09-13 DIAGNOSIS — I739 Peripheral vascular disease, unspecified: Secondary | ICD-10-CM | POA: Diagnosis not present

## 2019-09-13 DIAGNOSIS — B351 Tinea unguium: Secondary | ICD-10-CM | POA: Diagnosis not present

## 2019-09-21 ENCOUNTER — Other Ambulatory Visit: Payer: Self-pay | Admitting: Internal Medicine

## 2019-09-25 ENCOUNTER — Other Ambulatory Visit: Payer: Self-pay | Admitting: Internal Medicine

## 2019-09-26 ENCOUNTER — Encounter: Payer: Self-pay | Admitting: Internal Medicine

## 2019-09-28 MED ORDER — HYDROCODONE-ACETAMINOPHEN 5-325 MG PO TABS: 1.0000 | ORAL_TABLET | Freq: Every day | ORAL | 0 refills | Status: DC

## 2019-09-28 NOTE — Telephone Encounter (Signed)
Last RF 08/29/19 Last OV 03/06/19

## 2019-09-28 NOTE — Patient Instructions (Addendum)
  Medications reviewed and updated.  Changes include :   none    Please followup in 6 months   

## 2019-09-28 NOTE — Progress Notes (Signed)
Subjective:    Patient ID: Erin Roach, female    DOB: 10/04/1940, 79 y.o.   MRN: [id number]  HPI The patient is here for follow up of their chronic medical problems, including htn, hyperlipidemia, prediabetes, gerd, rls, asthma   She has not been able to exercise  - she had back surgery and will start PT soon.   She is working on weight loss but does not like to eat much protein and has not been able to exercise.    Medications and allergies reviewed with patient and updated if appropriate.  Patient Active Problem List   Diagnosis Date Noted  . Carotid arterial disease ([place]) 01/23/2019  . RBBB (right bundle branch block with left anterior fascicular block)   . Chest pain 01/22/2019  . Weakness of both lower extremities 01/02/2019  . Frequent falls 01/02/2019  . Cellulitis of right elbow 12/29/2018  . OSA on CPAP 06/15/2017  . Herpes zoster without complication 47/01/6282  . Prediabetes 02/23/2017  . Shortness of breath on exertion 02/23/2017  . B12 nutritional deficiency 02/23/2017  . Vitamin D deficiency 12/08/2016  . Tubular adenoma of colon 04/29/2016  . Essential hypertension 09/12/2015  . Allergic rhinitis 07/17/2015  . Insomnia 07/17/2015  . Obese 04/10/2015  . S/P left TKA 04/09/2015  . Osteopenia 12/04/2012  . GERD (gastroesophageal reflux disease) 02/07/2012  . 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 RESTLESS LEG SYNDROME 02/11/2009  . LEG EDEMA, BILATERAL 02/11/2009  . Hyperlipidemia 10/30/2008  . Urinary incontinence 10/30/2008  . Skin cancer 10/30/2008  . Asthma 11/21/2007  . Venous (peripheral) insufficiency 11/22/2006    Current Outpatient Medications on File Prior to Visit  Medication Sig Dispense Refill  . ACCU-CHEK GUIDE test strip TEST TWICE A DAY 100 strip 0  . amitriptyline (ELAVIL) 50 MG tablet Take 2 tablets (100 mg total) by mouth at bedtime. (Patient taking differently: Take 50 mg by mouth at bedtime. )    . Blood Glucose Monitoring Suppl (ONE  TOUCH ULTRA 2) w/Device KIT USE TO TEST BLOOD SUGARS UP TO 4 TIMES DAILY 1 each 0  . budesonide-formoterol (SYMBICORT) 160-4.5 MCG/ACT inhaler Inhale 2 puffs into the lungs daily. 1 Inhaler 5  . ergocalciferol (VITAMIN D2) 1.25 MG (50000 UT) capsule Take 1 capsule (50,000 Units total) by mouth once a week. Take one tablet by mouth on Mondays 12 capsule 0  . fluticasone (FLONASE) 50 MCG/ACT nasal spray Place 1 spray into both nostrils daily as needed for allergies. 11.1 mL 0  . furosemide (LASIX) 20 MG tablet TAKE ONE TABLET BY MOUTH DAILY (Patient taking differently: Take 40 mg by mouth daily. ) 90 tablet 1  . HORIZANT 600 MG TBCR Take 1 tablet by mouth at bedtime.     [name] HYDROcodone-acetaminophen (NORCO/VICODIN) 5-325 MG tablet Take 1 tablet by mouth at bedtime. 30 tablet 0  . Insulin Pen Needle (BD PEN NEEDLE NANO 2ND GEN) 32G X 4 MM MISC 1 Package by Does not apply route 2 (two) times daily. 200 each 0  . Lancets (ONETOUCH ULTRASOFT) lancets Use as instructed 100 each 0  . liraglutide (VICTOZA) 18 MG/3ML SOPN DIAL AND INJECT SUBCUTANEOUSLY 1.'8MG'$  DAILY EVERY MORNING 9 pen 0  . losartan (COZAAR) 100 MG tablet TAKE ONE TABLET BY MOUTH DAILY. Need office visit for more refills. 30 tablet 1  . metFORMIN (GLUCOPHAGE) 500 MG tablet TAKE 1 TABLET BY MOUTH TWICE A DAY WITH A MEAL. 180 tablet 0  . simvastatin (ZOCOR) 40 MG tablet TAKE  ONE TABLET BY MOUTH EVERY NIGHT AT BEDTIME 90 tablet 0   No current facility-administered medications on file prior to visit.    Past Medical History:  Diagnosis Date  . Allergic rhinitis   . Apnea   . Arthritis   . Asthma    very mild  . Back pain   . Bilateral lower extremity edema   . Carotid artery disease ([place])    a. Carotid duplex 03/2016 - duplex was stable, 40-98% RICA, 1-19% LICA  . Complication of anesthesia    post-op delirium  . Endometrial polyp   . Essential hypertension   . GERD (gastroesophageal reflux disease)   . Heart murmur    per pt  .  History of adenomatous polyp of colon    04-24-2016  tubular adenoma  . History of squamous cell carcinoma excision    pilonidal area second excision 02-02-2002  . HTN (hypertension)   . Hyperlipidemia   . Leg pain   . OAB (overactive bladder)   . RA (rheumatoid arthritis) ([place])   . RBBB (right bundle branch block with left anterior fascicular block)   . RLS (restless legs syndrome)   . Shortness of breath on exertion   . SUI (stress urinary incontinence, female)   . Venous insufficiency    legs  . Vitamin D deficiency     Past Surgical History:  Procedure Laterality Date  . APPENDECTOMY  07-02-2005   [name]   open  . CARDIAC CATHETERIZATION  01-23-2009  dr Darnell Level brodie   normal coronary arteries and lvf  . CARDIOVASCULAR STRESS TEST  03/31/2016   Low risk nuclear study w/ medium defect of mild severity in basal anterior and mid anterior location w/ no evidence ischemia or infarction/  normal LV function and wall motion,  nuclear stress ef 71%  . CATARACT EXTRACTION W/ INTRAOCULAR LENS IMPLANT Left 09/2016  . COLONOSCOPY  last one 04-24-2016  . HYSTEROSCOPY WITH D & C N/A 12/22/2016   Procedure: DILATATION AND CURETTAGE /HYSTEROSCOPY;  Surgeon: Dian Queen, MD;  Location: [facility];  Service: Gynecology;  Laterality: N/A;  . IR US GUIDANCE  09/08/2016  . POSTERIOR LUMBAR FUSION  02/ 2018    [facility] ([place], [place])  . SKIN CANCER EXCISION    . TONSILLECTOMY  child  . 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 TRANSTHORACIC ECHOCARDIOGRAM  01/21/2009   mild LVH, ef 14-78%, grade 1 diastolic dysfunction  . ULNAR NERVE TRANSPOSITION Left 12-20-2008  dr sypher   decompression and partial resection medial triceps fascia  . WIDE LOCAL EXCISION PILONIDAL AREA  02-02-2002  [name]   recurrent squamous cell carcinoma  in situ    Social History   Socioeconomic History  . Marital status: Married    Spouse name: Clare Gandy  . Number of children: 2  . Years of education: Not on file  . Highest education level: Not on file  Occupational History  . Occupation: retired Pharmacist, hospital  Tobacco Use  . Smoking status: Former Smoker    Packs/day: 0.50    Years: 10.00    Pack years: 5.00    Types: Cigarettes    Quit date: 12/10/1973    Years since quitting: 45.8  . Smokeless tobacco: Never Used  Substance and Sexual Activity  . Alcohol use: Yes    Comment: occasional  . Drug use:  No  . Sexual activity: Not on file  Other Topics Concern  . Not on file  Social History Narrative   2 children ages 52,35   Social Determinants of Health   Financial Resource Strain:   . Difficulty of Paying Living Expenses:   Food Insecurity:   . Worried About Charity fundraiser in the Last Year:   . Arboriculturist in the Last Year:   Transportation Needs:   . Film/video editor (Medical):   [name] Lack of Transportation (Non-Medical):   Physical Activity:   . Days of Exercise per Week:   . Minutes of Exercise per Session:   Stress:   . Feeling of Stress :   Social Connections:   . Frequency of Communication with Friends and Family:   . Frequency of Social Gatherings with Friends and Family:   . Attends Religious Services:   . Active Member of Clubs or Organizations:   . Attends Archivist Meetings:   [name] Marital Status:     Family History  Problem Relation Age of Onset  . Esophageal cancer Father   . Heart disease Father        Unknown what kind  . Hyperlipidemia Father   . Hypertension Father   . Cancer Father   . Heart failure Father   . Hyperlipidemia Mother   . Hypertension Mother   . CAD Sister        63 years younger than patient - had stent. Was told that her anorexia/bulimia may have played a role  . Stroke Maternal Grandmother 66  . Diabetes Maternal Grandmother   . Heart attack Maternal  Grandfather 93  . COPD Maternal Grandfather   . Heart attack Paternal Grandmother        49s  . Diabetes Other        MGGM    Review of Systems  Constitutional: Negative for chills and fever.  Respiratory: Positive for cough (occ). Negative for shortness of breath and wheezing.   Cardiovascular: Positive for leg swelling. Negative for chest pain and palpitations.  Neurological: Negative for headaches.       Objective:   Vitals:   09/29/19 1508  BP: (!) 142/72  Pulse: 83  Resp: 16  Temp: 98.4 F (36.9 C)  SpO2: 99%   BP Readings from Last 3 Encounters:  09/29/19 (!) 142/72  07/26/19 138/83  07/03/19 134/78   Wt Readings from Last 3 Encounters:  09/29/19 207 lb (93.9 kg)  07/26/19 203 lb (92.1 kg)  07/03/19 206 lb (93.4 kg)   Body mass index is 33.41 kg/m.   Physical Exam    Constitutional: Appears well-developed and well-nourished. No distress.  HENT:  Head: Normocephalic and atraumatic.  Neck: Neck supple. No tracheal deviation present. No thyromegaly present.  No cervical lymphadenopathy Cardiovascular: Normal rate, regular rhythm and normal heart sounds.   No murmur heard. No carotid bruit .  No edema Pulmonary/Chest: Effort normal and breath sounds normal. No respiratory distress. No has no wheezes. No rales.  Skin: Skin is warm and dry. Not diaphoretic.  Psychiatric: Normal mood and affect. Behavior is normal.      Assessment & Plan:    See Problem List for Assessment and Plan of chronic medical problems.    This visit occurred during the SARS-CoV-2 public health emergency.  Safety protocols were in place, including screening questions prior to the visit, additional usage of staff PPE, and extensive cleaning of exam room while observing  appropriate contact time as indicated for disinfecting solutions.

## 2019-09-29 ENCOUNTER — Encounter: Payer: Self-pay | Admitting: Internal Medicine

## 2019-09-29 ENCOUNTER — Ambulatory Visit: Payer: Medicare PPO | Admitting: Internal Medicine

## 2019-09-29 ENCOUNTER — Other Ambulatory Visit: Payer: Self-pay

## 2019-09-29 VITALS — BP 142/72 | HR 83 | Temp 98.4°F | Resp 16 | Ht 66.0 in | Wt 207.0 lb

## 2019-09-29 DIAGNOSIS — R809 Proteinuria, unspecified: Secondary | ICD-10-CM

## 2019-09-29 DIAGNOSIS — R7303 Prediabetes: Secondary | ICD-10-CM | POA: Diagnosis not present

## 2019-09-29 DIAGNOSIS — E7849 Other hyperlipidemia: Secondary | ICD-10-CM

## 2019-09-29 DIAGNOSIS — K219 Gastro-esophageal reflux disease without esophagitis: Secondary | ICD-10-CM | POA: Diagnosis not present

## 2019-09-29 DIAGNOSIS — G2581 Restless legs syndrome: Secondary | ICD-10-CM | POA: Diagnosis not present

## 2019-09-29 DIAGNOSIS — Z6833 Body mass index (BMI) 33.0-33.9, adult: Secondary | ICD-10-CM

## 2019-09-29 DIAGNOSIS — J45909 Unspecified asthma, uncomplicated: Secondary | ICD-10-CM | POA: Diagnosis not present

## 2019-09-29 DIAGNOSIS — E6609 Other obesity due to excess calories: Secondary | ICD-10-CM

## 2019-09-29 DIAGNOSIS — I10 Essential (primary) hypertension: Secondary | ICD-10-CM

## 2019-09-29 DIAGNOSIS — E1129 Type 2 diabetes mellitus with other diabetic kidney complication: Secondary | ICD-10-CM | POA: Diagnosis not present

## 2019-09-29 MED ORDER — AMITRIPTYLINE HCL 50 MG PO TABS: 50.0000 mg | ORAL_TABLET | Freq: Every day | ORAL | Status: DC

## 2019-09-29 MED ORDER — PREGABALIN 75 MG PO CAPS: ORAL_CAPSULE | ORAL | 0 refills | Status: DC

## 2019-09-29 NOTE — Assessment & Plan Note (Signed)
Chronic Reviewed recent blood work Continue metformin and victoza Low sugar / carb diet Will be starting regular exercise soon Working on weight loss Follow-up in 6 months

## 2019-09-29 NOTE — Assessment & Plan Note (Signed)
Chronic Controlled Continue inhalers

## 2019-09-29 NOTE — Assessment & Plan Note (Addendum)
Chronic Currently working on weight loss Plan to start PT soon and then will increase regular exercise Following with healthy weight and management, but would like me to manage F/u in 6 months

## 2019-09-29 NOTE — Assessment & Plan Note (Addendum)
Chronic Taking hydrocodone,elavil, horizant and mirapex  Medicine most of the time worse Continue current medications

## 2019-09-29 NOTE — Assessment & Plan Note (Addendum)
Chronic Working on weight loss Low sugar / carb diet Stressed regular exercise

## 2019-09-29 NOTE — Assessment & Plan Note (Addendum)
Chronic BP controlled Current regimen effective and well tolerated Continue current medications at current doses

## 2019-09-29 NOTE — Assessment & Plan Note (Addendum)
Chronic Continue daily statin Regular exercise and healthy diet encouraged  

## 2019-10-03 DIAGNOSIS — M545 Low back pain: Secondary | ICD-10-CM | POA: Diagnosis not present

## 2019-10-04 ENCOUNTER — Encounter: Payer: Self-pay | Admitting: Physical Therapy

## 2019-10-04 ENCOUNTER — Other Ambulatory Visit: Payer: Self-pay

## 2019-10-04 ENCOUNTER — Ambulatory Visit: Payer: Medicare PPO | Attending: Physician Assistant | Admitting: Physical Therapy

## 2019-10-04 DIAGNOSIS — R2689 Other abnormalities of gait and mobility: Secondary | ICD-10-CM | POA: Insufficient documentation

## 2019-10-04 DIAGNOSIS — G8929 Other chronic pain: Secondary | ICD-10-CM | POA: Diagnosis not present

## 2019-10-04 DIAGNOSIS — M545 Low back pain, unspecified: Secondary | ICD-10-CM

## 2019-10-04 DIAGNOSIS — M6281 Muscle weakness (generalized): Secondary | ICD-10-CM

## 2019-10-04 NOTE — Patient Instructions (Signed)
Access Code: [email or web address] URL: [email or web address]: 05/26/2021Prepared by: Venetia Night BeuhringExercises  Supine Posterior Pelvic Tilt - 1 x daily - 7 x weekly - 2 sets - 10 reps  Supine Piriformis Stretch with Foot on Ground - 1 x daily - 7 x weekly - 1 sets - 2 reps - 30 hold  Supine Figure 4 Piriformis Stretch - 1 x daily - 7 x weekly - 1 sets - 2 reps - 30 hold  Hooklying Single Knee to Chest Stretch - 1 x daily - 7 x weekly - 1 sets - 2 reps - 30 hold  Supine Hamstring Stretch - 1 x daily - 7 x weekly - 1 sets - 2 reps - 30 hold  Hooklying Gluteal Sets - 1 x daily - 7 x weekly - 2 sets - 10 reps - 3 hold  Sidelying Transversus Abdominis Bracing - 1 x daily - 7 x weekly - 1 sets - 10 reps - 10 hold

## 2019-10-04 NOTE — Therapy (Signed)
[facility] [address] [place], [place], [address] Phone: [phone]   Fax:  [phone]  Physical Therapy Evaluation  Patient Details  Name: Erin Roach MRN: [id number] Date of Birth: [birth year] Referring Provider (PT): Berneda Rose, [place] Georgann Housekeeper, MD)   Encounter Date: 10/04/2019  PT End of Session - 10/04/19 1230    Visit Number  1    Date for PT Re-Evaluation  11/29/19    Authorization Type  [employer] Medicare    PT Start Time  [id number]    PT Stop Time  0928    PT Time Calculation (min)  46 min    Activity Tolerance  Patient tolerated treatment well;No increased pain    Behavior During Therapy  WFL for tasks assessed/performed       Past Medical History:  Diagnosis Date  . Allergic rhinitis   . Apnea   . Arthritis   . Asthma    very mild  . Back pain   . Bilateral lower extremity edema   . Carotid artery disease ([place])    a. Carotid duplex 03/2016 - duplex was stable, [id number] RICA, 123456 LICA  . Complication of anesthesia    post-op delirium  . Endometrial polyp   . Essential hypertension   . GERD (gastroesophageal reflux disease)   . Heart murmur    per pt  . History of adenomatous polyp of colon    04-24-2016  tubular adenoma  . History of squamous cell carcinoma excision    pilonidal area second excision 02-02-2002  . HTN (hypertension)   . Hyperlipidemia   . Leg pain   . OAB (overactive bladder)   . RA (rheumatoid arthritis) ([place])   . RBBB (right bundle branch block with left anterior fascicular block)   . RLS (restless legs syndrome)   . Shortness of breath on exertion   . SUI (stress urinary incontinence, female)   . Venous insufficiency    legs  . Vitamin D deficiency     Past Surgical History:  Procedure Laterality Date  . APPENDECTOMY  07-02-2005   [name]   open  . CARDIAC CATHETERIZATION  01-23-2009  dr Darnell Level brodie   normal coronary arteries and lvf  . CARDIOVASCULAR  STRESS TEST  03/31/2016   Low risk nuclear study w/ medium defect of mild severity in basal anterior and mid anterior location w/ no evidence ischemia or infarction/  normal LV function and wall motion,  nuclear stress ef 71%  . CATARACT EXTRACTION W/ INTRAOCULAR LENS IMPLANT Left 09/2016  . COLONOSCOPY  last one 04-24-2016  . HYSTEROSCOPY WITH D & C N/A 12/22/2016   Procedure: DILATATION AND CURETTAGE /HYSTEROSCOPY;  Surgeon: Dian Queen, MD;  Location: [place];  Service: Gynecology;  Laterality: N/A;  . IR US GUIDANCE  09/08/2016  . POSTERIOR LUMBAR FUSION  02/ 2018    [facility] ([place])  . SKIN CANCER EXCISION    . TONSILLECTOMY  child  . 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 TRANSTHORACIC ECHOCARDIOGRAM  01/21/2009   mild LVH, ef [id number], grade 1 diastolic dysfunction  . ULNAR NERVE TRANSPOSITION Left 12-20-2008  dr sypher   decompression and partial resection medial triceps fascia  . WIDE LOCAL EXCISION PILONIDAL AREA  02-02-2002  [name]   recurrent squamous cell carcinoma in situ  There were no vitals filed for this visit.   Subjective Assessment - 10/04/19 0846    Subjective  Pt is a previous Pt who returns following a lumbar spine revision fusion approx 1 month ago with additional segmental fusion so now fusion L4-S1.  MD (Dr. Georgann Housekeeper [facility]) found a huge cyst on sciatic nerve in lumbar region causing Rt LE pain which is now removed.  Ongoing Lt knee pain from TKR approx 4 years ago which may limit ther ex.    Pertinent History  "[name]";  left TKR;  lumbar fusion; restless legs, HTN; diabetes;  mild neuropathy;  osteopenia    Limitations  Sitting;Lifting;Walking;House hold activities    How long can you sit comfortably?  45 min    How long can you stand comfortably?  5 min    How  long can you walk comfortably?  household mobility, 5 min    Diagnostic tests  none    Patient Stated Goals  I would like to feel better, be more active; better quality of life, walk longer    Currently in Pain?  Yes    Pain Score  5     Pain Location  Back    Pain Orientation  Right;Left;Mid;Lower    Pain Descriptors / Indicators  Sharp    Pain Type  Surgical pain    Pain Radiating Towards  no LE pain    Pain Onset  More than a month ago    Pain Frequency  Intermittent    Aggravating Factors   prolonged walking, standing, light household activities    Pain Relieving Factors  Tylenol, laying down, sitting         [facility] PT Assessment - 10/04/19 0001      Assessment   Medical Diagnosis  Z98.1 (ICD-10-CM) - Status post lumbar spinal fusion    Referring Provider (PT)  Berneda Rose, PA   Georgann Housekeeper, MD   Onset Date/Surgical Date  09/04/19   approx   Next MD Visit  --   6 weeks   Prior Therapy  yes for back and knee      Precautions   Precautions  None    Precaution Comments  post-surgical precautions lifted yesterday      Restrictions   Weight Bearing Restrictions  No      Balance Screen   Has the patient fallen in the past 6 months  No    Has the patient had a decrease in activity level because of a fear of falling?   Yes    Is the patient reluctant to leave their home because of a fear of falling?   No      Home Environment   Living Environment  Private residence    Living Arrangements  Spouse/significant other    Type of [place] to enter    Entrance Stairs-Number of Steps  2    [place]  Two level;Able to live on main level with bedroom/bathroom    Home Equipment  None      Prior Function   Level of Independence  Independent    Vocation  Retired    Leisure  read books, visit with friend/family      Cognition   Overall Cognitive Status  Within Functional Limits for tasks assessed      Observation/Other Assessments   Observations   well healed midline lumbar incision, restricted scar/fascial mobility lumbar region  Focus on Therapeutic Outcomes (FOTO)   69% limited, goal 55%      Functional Tests   Functional tests  Sit to Stand;Single leg stance;Squat      Squat   Comments  pain, uses knees, stiff trunk and hips      Single Leg Stance   Comments  able x 3 sec on Rt, unable on Lt      Sit to Stand   Comments  Ind, some use of UEs      ROM / Strength   AROM / PROM / Strength  AROM      AROM   Overall AROM Comments  all trunk mobility limited 50%, Rt hip ER limited 30%, bil hip flexion limited 20%      Strength   Right/Left Hip  Right;Left    Right Hip Flexion  4/5    Right Hip Extension  4-/5    Right Hip External Rotation   4+/5    Right Hip ABduction  4/5    Left Hip Flexion  4/5    Left Hip Extension  4-/5    Left Hip External Rotation  4+/5    Left Hip ABduction  4/5    Right Knee Flexion  4-/5    Right Knee Extension  4/5    Left Knee Extension  4+/5    Right Ankle Dorsiflexion  4+/5    Left Ankle Dorsiflexion  4+/5    Lumbar Flexion  3+/5    Lumbar Extension  3+/5      Flexibility   Soft Tissue Assessment /Muscle Length  yes    Hamstrings  limited 30% bil    Piriformis  Rt limited 30%      Transfers   Five time sit to stand comments   12 sec, no hands      Ambulation/Gait   Gait Pattern  Decreased step length - right;Decreased step length - left;Step-through pattern;Decreased trunk rotation;Trendelenburg;Narrow base of support      Standardized Balance Assessment   Standardized Balance Assessment  Timed Up and Go Test      Timed Up and Go Test   TUG  Normal TUG    Normal TUG (seconds)  13    TUG Comments  no UEs on sit to stand and stand to sit                  Objective measurements completed on examination: See above findings.              PT Education - 10/04/19 0920    Education Details  Access Code: [email or web address]    Person(s) Educated  Patient     Methods  Explanation;Verbal cues;Handout;Demonstration    Comprehension  Verbalized understanding;Returned demonstration       PT Short Term Goals - 10/04/19 1244      PT SHORT TERM GOAL #1   Title  Pt will be compliant and ind with initial HEP including walking 2-3 times a day for short duration as tol    Time  4    Period  Weeks    Status  New    Target Date  11/01/19      PT SHORT TERM GOAL #2   Title  Pt will demo gait symmetry with improved step length and equal WB between bil LEs.    Baseline  -    Time  4    Period  Weeks    Status  New    Target  Date  11/01/19      PT SHORT TERM GOAL #3   Title  Pt will be able to perform light household standing tasks x 5'    Baseline  -    Time  4    Period  Weeks    Status  New    Target Date  11/01/19      PT SHORT TERM GOAL #4   Title  Pt will report improved pain with daily household tasks by at least 20%    Baseline  -    Time  4    Period  Weeks    Status  New    Target Date  11/01/19        PT Long Term Goals - 10/04/19 1247      PT LONG TERM GOAL #1   Title  Pt will achieve at least 4+/5 for trunk and LE strength for improved tolerance of daily tasks.    Baseline  -    Time  8    Period  Weeks    Status  New    Target Date  11/29/19      PT LONG TERM GOAL #2   Title  Reduce FOTO to 55% to demo less limitation.    Baseline  69%    Time  8    Period  Weeks    Status  New    Target Date  11/29/19      PT LONG TERM GOAL #3   Title  Pt will tolerate standing and walking tasks such as light housework and running errands x 20' with pain not to exceed 5/10.    Baseline  -    Time  8    Period  Weeks    Status  New    Target Date  11/29/19      PT LONG TERM GOAL #4   Title  Pt will be able to articulate at least two tools from HEP or behavioral modifications for pain management.    Baseline  -    Time  8    Period  Weeks    Status  New    Target Date  11/29/19      PT LONG TERM GOAL #5   Title  Pt  will attend at least 1 aquatic PT session to learn ways to exercise in the pool to reduce load bearing strain while strengthening and building endurance.    Baseline  -    Time  8    Period  Weeks    Status  New    Target Date  11/29/19      PT LONG TERM GOAL #6   Title  Pt will be ind with advanced HEP and understand how to safely progress.    Baseline  -    Time  8    Period  Weeks    Status  New    Target Date  11/29/19      PT LONG TERM GOAL #7   Baseline  -             Plan - 10/04/19 1231    Clinical Impression Statement  Pt returns to clinic (previous patient) having had a revision fusion of L4/5 and new fusion of L5/S1 approx 1 month ago.  She had a large cyst removed which was told was sitting on her sciatic nerve.  She no longer has LE pain but does report ongoing LBP.  She has limited  standing/walking tolerance of 5'.  Sitting tolerance is 27' with relief after standing.  Pt presents with gait asymmetry, decreased trunk and hip ROM, poor SLS balance, LE weakness, LE flexibility restrictions, and trunk weakness.  She has functional limitations with squatting/bending.  5x sit to stand is 12 sec (no hands) and TUG is 13 sec.  PT encouraged a walking program, started Pt on gentle HEP, and recommended 1-3 aquatic PT sessions alongside land-based PT to address deficits.    Comorbidities  lumbar fusion revision L4/5 and new fusion L5/S1 April 2021, Lt TKR with poor outcome/pain, HTN, diabetes, asthma, osteopenia    Examination-Activity Limitations  Locomotion Level;Bend;Squat;Stairs;Lift    Examination-Participation Restrictions  Cleaning;Community Activity;Shop;Laundry    Stability/Clinical Decision Making  Stable/Uncomplicated    Clinical Decision Making  Low    Rehab Potential  Good    PT Frequency  2x / week    PT Duration  8 weeks    PT Treatment/Interventions  Aquatic Therapy;ADLs/Self Care Home Management;Cryotherapy;Electrical Stimulation;Moist Heat;Gait training;Stair  training;Functional mobility training;Therapeutic exercise;Balance training;Neuromuscular re-education;Manual techniques;Patient/family education;Passive range of motion;Dry needling;Spinal Manipulations;Joint Manipulations;Taping    PT Next Visit Plan  NuStep, review HEP, has Pt started walking (2-3 min 3x/day with increased time as tol), SL TrA, seated core and LE strength    PT Home Exercise Plan  Access Code: [email or web address]    Consulted and Agree with Plan of Care  Patient       Patient will benefit from skilled therapeutic intervention in order to improve the following deficits and impairments:  Abnormal gait, Decreased range of motion, Difficulty walking, Increased fascial restricitons, Decreased strength, Postural dysfunction, Improper body mechanics, Impaired flexibility, Hypomobility, Decreased scar mobility, Pain, Decreased activity tolerance, Decreased endurance, Decreased mobility  Visit Diagnosis: Chronic bilateral low back pain without sciatica - Plan: PT plan of care cert/re-cert  Muscle weakness (generalized) - Plan: PT plan of care cert/re-cert  Other abnormalities of gait and mobility - Plan: PT plan of care cert/re-cert     Problem List Patient Active Problem List   Diagnosis Date Noted  . Carotid arterial disease ([place]) 01/23/2019  . RBBB (right bundle branch block with left anterior fascicular block)   . Chest pain 01/22/2019  . Weakness of both lower extremities 01/02/2019  . Frequent falls 01/02/2019  . OSA on CPAP 06/15/2017  . Diabetes ([place]) 03/02/2017  . Herpes zoster without complication [id number]  . B12 nutritional deficiency 02/23/2017  . Vitamin D deficiency 12/08/2016  . Tubular adenoma of colon 04/29/2016  . Essential hypertension 09/12/2015  . Allergic rhinitis 07/17/2015  . Insomnia 07/17/2015  . Obese 04/10/2015  . S/P left TKA 04/09/2015  . Osteopenia 12/04/2012  . 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 RESTLESS LEG SYNDROME 02/11/2009  . LEG EDEMA,  BILATERAL 02/11/2009  . Hyperlipidemia 10/30/2008  . Urinary incontinence 10/30/2008  . Skin cancer 10/30/2008  . Asthma 11/21/2007  . Venous (peripheral) insufficiency 11/22/2006    Baruch Merl, PT 10/04/19 12:58 PM   [facility] [address] [place], [place], [address] Phone: [phone]   Fax:  [phone]  Name: KRYSTIN RUDDEN MRN: [id number] Date of Birth: 1941/05/10

## 2019-10-10 ENCOUNTER — Other Ambulatory Visit: Payer: Self-pay

## 2019-10-10 ENCOUNTER — Ambulatory Visit: Payer: Medicare PPO | Attending: Physician Assistant | Admitting: Physical Therapy

## 2019-10-10 ENCOUNTER — Encounter: Payer: Self-pay | Admitting: Physical Therapy

## 2019-10-10 DIAGNOSIS — M545 Low back pain, unspecified: Secondary | ICD-10-CM

## 2019-10-10 DIAGNOSIS — M25551 Pain in right hip: Secondary | ICD-10-CM | POA: Insufficient documentation

## 2019-10-10 DIAGNOSIS — G8929 Other chronic pain: Secondary | ICD-10-CM | POA: Diagnosis not present

## 2019-10-10 DIAGNOSIS — R2689 Other abnormalities of gait and mobility: Secondary | ICD-10-CM | POA: Insufficient documentation

## 2019-10-10 DIAGNOSIS — M6281 Muscle weakness (generalized): Secondary | ICD-10-CM | POA: Diagnosis not present

## 2019-10-10 DIAGNOSIS — M25562 Pain in left knee: Secondary | ICD-10-CM | POA: Insufficient documentation

## 2019-10-10 NOTE — Therapy (Signed)
[facility] [address] [place], [place], [address] Phone: [phone]   Fax:  [phone]  Physical Therapy Treatment  Patient Details  Name: Erin Roach MRN: [id number] Date of Birth: 1941-03-28 Referring Provider (PT): Berneda Rose, [place] Georgann Housekeeper, MD)   Encounter Date: 10/10/2019  PT End of Session - 10/10/19 1227    Visit Number  2    Date for PT Re-Evaluation  11/29/19    Authorization Type  [employer]    Authorization Time Period  cohere approved 12 visits 5/26-8/17    Authorization - Visit Number  2    Authorization - Number of Visits  12    Progress Note Due on Visit  10    PT Start Time  [id number]    PT Stop Time  1232    PT Time Calculation (min)  45 min    Activity Tolerance  Patient tolerated treatment well;No increased pain    Behavior During Therapy  WFL for tasks assessed/performed       Past Medical History:  Diagnosis Date  . Allergic rhinitis   . Apnea   . Arthritis   . Asthma    very mild  . Back pain   . Bilateral lower extremity edema   . Carotid artery disease ([place])    a. Carotid duplex 03/2016 - duplex was stable, [id number] RICA, 123456 LICA  . Complication of anesthesia    post-op delirium  . Endometrial polyp   . Essential hypertension   . GERD (gastroesophageal reflux disease)   . Heart murmur    per pt  . History of adenomatous polyp of colon    04-24-2016  tubular adenoma  . History of squamous cell carcinoma excision    pilonidal area second excision 02-02-2002  . HTN (hypertension)   . Hyperlipidemia   . Leg pain   . OAB (overactive bladder)   . RA (rheumatoid arthritis) ([place])   . RBBB (right bundle branch block with left anterior fascicular block)   . RLS (restless legs syndrome)   . Shortness of breath on exertion   . SUI (stress urinary incontinence, female)   . Venous insufficiency    legs  . Vitamin D deficiency     Past Surgical History:  Procedure  Laterality Date  . APPENDECTOMY  07-02-2005   [name]   open  . CARDIAC CATHETERIZATION  01-23-2009  dr Darnell Level brodie   normal coronary arteries and lvf  . CARDIOVASCULAR STRESS TEST  03/31/2016   Low risk nuclear study w/ medium defect of mild severity in basal anterior and mid anterior location w/ no evidence ischemia or infarction/  normal LV function and wall motion,  nuclear stress ef 71%  . CATARACT EXTRACTION W/ INTRAOCULAR LENS IMPLANT Left 09/2016  . COLONOSCOPY  last one 04-24-2016  . HYSTEROSCOPY WITH D & C N/A 12/22/2016   Procedure: DILATATION AND CURETTAGE /HYSTEROSCOPY;  Surgeon: Dian Queen, MD;  Location: [place];  Service: Gynecology;  Laterality: N/A;  . IR US GUIDANCE  09/08/2016  . POSTERIOR LUMBAR FUSION  02/ 2018    [facility] ([place])  . SKIN CANCER EXCISION    . TONSILLECTOMY  child  . 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 TRANSTHORACIC ECHOCARDIOGRAM  01/21/2009   mild LVH, ef Q000111Q, grade 1 diastolic dysfunction  .  ULNAR NERVE TRANSPOSITION Left 12-20-2008  dr sypher   decompression and partial resection medial triceps fascia  . WIDE LOCAL EXCISION PILONIDAL AREA  02-02-2002  [name]   recurrent squamous cell carcinoma in situ    There were no vitals filed for this visit.  Subjective Assessment - 10/10/19 1151    Subjective  We went to our [place] this weekend and I worked in the yard a lot and my back is killing me.    Pertinent History  "[place]";  left TKR;  lumbar fusion; restless legs, HTN; diabetes;  mild neuropathy;  osteopenia    How long can you sit comfortably?  45 min    How long can you stand comfortably?  5 min    How long can you walk comfortably?  household mobility, 5 min    Diagnostic tests  none    Patient Stated Goals  I would like to feel better, be more  active; better quality of life, walk longer    Currently in Pain?  Yes    Pain Score  5     Pain Location  Back    Pain Orientation  Right;Left;Mid;Lower    Pain Descriptors / Indicators  Tightness;Sore    Pain Type  Surgical pain    Pain Onset  More than a month ago    Pain Frequency  Intermittent    Aggravating Factors   prolonged walking, standing, ight housework, yard work    Pain Relieving Factors  Tylenol, laying down, sitting    Pain Location  Back    Pain Orientation  Right;Posterior    Pain Type  Chronic pain    Pain Onset  More than a month ago    Aggravating Factors   walk, stand                        [facility] Adult PT Treatment/Exercise - 10/10/19 0001      Lumbar Exercises: Stretches   Active Hamstring Stretch  1 rep;30 seconds;Right;Left    Active Hamstring Stretch Limitations  supine hold behind thigh straighten knee    Lower Trunk Rotation  3 reps;10 seconds    Pelvic Tilt  10 reps    Piriformis Stretch  Left;Right;1 rep;30 seconds      Lumbar Exercises: Aerobic   Nustep  L1 x 6' seat 8 arms 10      Lumbar Exercises: Supine   Other Supine Lumbar Exercises  hip flexion isometric 5x5 sec holds bil, alt      Manual Therapy   Manual Therapy  Myofascial release;Soft tissue mobilization    Soft tissue mobilization  thoracic paraspinal stripping bil, bil QL, thoracolumbar fascial bil, scar mobs central lumbar    Myofascial Release  thoracodorsal fascia bil in SL             PT Education - 10/10/19 1226    Education Details  Access Code: [email or web address]    Person(s) Educated  Patient    Methods  Explanation;Demonstration;Handout;Verbal cues    Comprehension  Verbalized understanding;Returned demonstration       PT Short Term Goals - 10/10/19 1227      PT SHORT TERM GOAL #1   Title  Pt will be compliant and ind with initial HEP including walking 2-3 times a day for short duration as tol    Status  On-going        PT Long Term Goals -  10/04/19 1247  PT LONG TERM GOAL #1   Title  Pt will achieve at least 4+/5 for trunk and LE strength for improved tolerance of daily tasks.    Baseline  -    Time  8    Period  Weeks    Status  New    Target Date  11/29/19      PT LONG TERM GOAL #2   Title  Reduce FOTO to 55% to demo less limitation.    Baseline  69%    Time  8    Period  Weeks    Status  New    Target Date  11/29/19      PT LONG TERM GOAL #3   Title  Pt will tolerate standing and walking tasks such as light housework and running errands x 20' with pain not to exceed 5/10.    Baseline  -    Time  8    Period  Weeks    Status  New    Target Date  11/29/19      PT LONG TERM GOAL #4   Title  Pt will be able to articulate at least two tools from HEP or behavioral modifications for pain management.    Baseline  -    Time  8    Period  Weeks    Status  New    Target Date  11/29/19      PT LONG TERM GOAL #5   Title  Pt will attend at least 1 aquatic PT session to learn ways to exercise in the pool to reduce load bearing strain while strengthening and building endurance.    Baseline  -    Time  8    Period  Weeks    Status  New    Target Date  11/29/19      PT LONG TERM GOAL #6   Title  Pt will be ind with advanced HEP and understand how to safely progress.    Baseline  -    Time  8    Period  Weeks    Status  New    Target Date  11/29/19      PT LONG TERM GOAL #7   Baseline  -            Plan - 10/10/19 1333    Clinical Impression Statement  Pt arrived with increased bil LBP following bending yardwork over weekend at her [place].  She admitted she overdid it and didn't listen to pain being her guide for activity.  She presented with increased soreness and stiffness in bil thoracic and lumbar paraspinals, QL and has ongoing thoracodorsal fascial restrictions.  PT performed STM/techniques for scar mobs and to address thoracic/lumbar tension with report of signif relief by Pt.  Pt with good  tolerance of current HEP.  PT added counter SLS, NuStep and supine hip flexion isom today and progressed HEP.  Pt will continue to benefit from skilled PT for improved pain and function following surgery.    Comorbidities  lumbar fusion revision L4/5 and new fusion L5/S1 April 2021, Lt TKR with poor outcome/pain, HTN, diabetes, asthma, osteopenia    PT Frequency  2x / week    PT Duration  8 weeks    PT Treatment/Interventions  Aquatic Therapy;ADLs/Self Care Home Management;Cryotherapy;Electrical Stimulation;Moist Heat;Gait training;Stair training;Functional mobility training;Therapeutic exercise;Balance training;Neuromuscular re-education;Manual techniques;Patient/family education;Passive range of motion;Dry needling;Spinal Manipulations;Joint Manipulations;Taping    PT Next Visit Plan  NuStep, review HEP, f/u on  walking, SL TrA, seated core and LE strength, standing posture review for STGs, watch gait for symmetry and longer step length    PT Home Exercise Plan  Access Code: [email or web address]    Consulted and Agree with Plan of Care  Patient       Patient will benefit from skilled therapeutic intervention in order to improve the following deficits and impairments:     Visit Diagnosis: Chronic bilateral low back pain without sciatica  Muscle weakness (generalized)  Other abnormalities of gait and mobility     Problem List Patient Active Problem List   Diagnosis Date Noted  . Carotid arterial disease ([place]) 01/23/2019  . RBBB (right bundle branch block with left anterior fascicular block)   . Chest pain 01/22/2019  . Weakness of both lower extremities 01/02/2019  . Frequent falls 01/02/2019  . OSA on CPAP 06/15/2017  . Diabetes ([place]) 03/02/2017  . Herpes zoster without complication 123456  . B12 nutritional deficiency 02/23/2017  . Vitamin D deficiency 12/08/2016  . Tubular adenoma of colon 04/29/2016  . Essential hypertension 09/12/2015  . Allergic rhinitis 07/17/2015  . Insomnia  07/17/2015  . Obese 04/10/2015  . S/P left TKA 04/09/2015  . Osteopenia 12/04/2012  . 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 RESTLESS LEG SYNDROME 02/11/2009  . LEG EDEMA, BILATERAL 02/11/2009  . Hyperlipidemia 10/30/2008  . Urinary incontinence 10/30/2008  . Skin cancer 10/30/2008  . Asthma 11/21/2007  . Venous (peripheral) insufficiency 11/22/2006    Baruch Merl, PT 10/10/19 1:36 PM   [facility] [address], [place], [place], [address] Phone: [phone]   Fax:  [phone]  Name: JOLINA HOLBROOK MRN: [id number] Date of Birth: [birth year]

## 2019-10-10 NOTE — Patient Instructions (Signed)
Access Code: [email or web address] URL: [email or web address]: 05/26/2021Prepared by: Venetia Night BeuhringExercises  Supine Posterior Pelvic Tilt - 1 x daily - 7 x weekly - 2 sets - 10 reps  Supine Piriformis Stretch with Foot on Ground - 1 x daily - 7 x weekly - 1 sets - 2 reps - 30 hold  Supine Figure 4 Piriformis Stretch - 1 x daily - 7 x weekly - 1 sets - 2 reps - 30 hold  Hooklying Single Knee to Chest Stretch - 1 x daily - 7 x weekly - 1 sets - 2 reps - 30 hold  Supine Hamstring Stretch - 1 x daily - 7 x weekly - 1 sets - 2 reps - 30 hold  Hooklying Gluteal Sets - 1 x daily - 7 x weekly - 2 sets - 10 reps - 3 hold  Hooklying Isometric Hip Flexion - 1 x daily - 7 x weekly - 10 reps - 3 sets  Sidelying Transversus Abdominis Bracing - 1 x daily - 7 x weekly - 1 sets - 10 reps - 10 hold  Standing Single Leg Stance with Counter Support - 1 x daily - 7 x weekly - 1 sets - 3 reps - 10 hold

## 2019-10-11 ENCOUNTER — Other Ambulatory Visit: Payer: Self-pay

## 2019-10-11 ENCOUNTER — Encounter: Payer: Self-pay | Admitting: Internal Medicine

## 2019-10-11 MED ORDER — LOSARTAN POTASSIUM 100 MG PO TABS: ORAL_TABLET | ORAL | 1 refills | Status: DC

## 2019-10-12 ENCOUNTER — Ambulatory Visit: Payer: Medicare PPO | Admitting: Physical Therapy

## 2019-10-12 ENCOUNTER — Other Ambulatory Visit: Payer: Self-pay

## 2019-10-12 ENCOUNTER — Encounter: Payer: Self-pay | Admitting: Physical Therapy

## 2019-10-12 DIAGNOSIS — G8929 Other chronic pain: Secondary | ICD-10-CM

## 2019-10-12 DIAGNOSIS — M6281 Muscle weakness (generalized): Secondary | ICD-10-CM

## 2019-10-12 DIAGNOSIS — R2689 Other abnormalities of gait and mobility: Secondary | ICD-10-CM | POA: Diagnosis not present

## 2019-10-12 DIAGNOSIS — M545 Low back pain, unspecified: Secondary | ICD-10-CM

## 2019-10-12 DIAGNOSIS — M25551 Pain in right hip: Secondary | ICD-10-CM | POA: Diagnosis not present

## 2019-10-12 DIAGNOSIS — M25562 Pain in left knee: Secondary | ICD-10-CM | POA: Diagnosis not present

## 2019-10-12 NOTE — Patient Instructions (Signed)
Access Code: [email or web address] URL: [email or web address]: 05/26/2021Prepared by: Venetia Night BeuhringExercises  Supine Posterior Pelvic Tilt - 1 x daily - 7 x weekly - 2 sets - 10 reps  Supine Piriformis Stretch with Foot on Ground - 1 x daily - 7 x weekly - 1 sets - 2 reps - 30 hold  Supine Figure 4 Piriformis Stretch - 1 x daily - 7 x weekly - 1 sets - 2 reps - 30 hold  Hooklying Single Knee to Chest Stretch - 1 x daily - 7 x weekly - 1 sets - 2 reps - 30 hold  Supine Hamstring Stretch - 1 x daily - 7 x weekly - 1 sets - 2 reps - 30 hold  Hooklying Isometric Hip Flexion - 1 x daily - 7 x weekly - 10 reps - 3 sets  Standing Single Leg Stance with Counter Support - 1 x daily - 7 x weekly - 1 sets - 3 reps - 10 hold  Standing Hip Extension with Counter Support - 1 x daily - 7 x weekly - 1 sets - 5 reps  Clamshell with Resistance - 1 x daily - 7 x weekly - 2 sets - 10 reps  Supine Bridge with Resistance Band - 1 x daily - 7 x weekly - 2 sets - 10 reps

## 2019-10-12 NOTE — Therapy (Signed)
[facility] [address], [place], [place], [address] Phone: [phone]   Fax:  [phone]  Physical Therapy Treatment  Patient Details  Name: Erin Roach MRN: [id number] Date of Birth: [birth year] Referring Provider (PT): Berneda Rose, [place] Georgann Housekeeper, MD)   Encounter Date: 10/12/2019  PT End of Session - 10/12/19 1403    Visit Number  3    Date for PT Re-Evaluation  11/29/19    Authorization Type  [employer]    Authorization Time Period  cohere approved 12 visits 5/26-8/17    Authorization - Visit Number  3    Authorization - Number of Visits  12    Progress Note Due on Visit  10    PT Start Time  1400    PT Stop Time  1440    PT Time Calculation (min)  40 min    Activity Tolerance  Patient tolerated treatment well;No increased pain    Behavior During Therapy  WFL for tasks assessed/performed       Past Medical History:  Diagnosis Date   Allergic rhinitis    Apnea    Arthritis    Asthma    very mild   Back pain    Bilateral lower extremity edema    Carotid artery disease ([facility])    a. Carotid duplex 03/2016 - duplex was stable, [id number] RICA, 123456 LICA   Complication of anesthesia    post-op delirium   Endometrial polyp    Essential hypertension    GERD (gastroesophageal reflux disease)    Heart murmur    per pt   History of adenomatous polyp of colon    04-24-2016  tubular adenoma   History of squamous cell carcinoma excision    pilonidal area second excision 02-02-2002   HTN (hypertension)    Hyperlipidemia    Leg pain    OAB (overactive bladder)    RA (rheumatoid arthritis) ([facility])    RBBB (right bundle branch block with left anterior fascicular block)    RLS (restless legs syndrome)    Shortness of breath on exertion    SUI (stress urinary incontinence, female)    Venous insufficiency    legs   Vitamin D deficiency     Past Surgical History:  Procedure  Laterality Date   APPENDECTOMY  07-02-2005   [name]   open   CARDIAC CATHETERIZATION  01-23-2009  dr Darnell Level brodie   normal coronary arteries and lvf   CARDIOVASCULAR STRESS TEST  03/31/2016   Low risk nuclear study w/ medium defect of mild severity in basal anterior and mid anterior location w/ no evidence ischemia or infarction/  normal LV function and wall motion,  nuclear stress ef 71%   CATARACT EXTRACTION W/ INTRAOCULAR LENS IMPLANT Left 09/2016   COLONOSCOPY  last one 04-24-2016   HYSTEROSCOPY WITH D & C N/A 12/22/2016   Procedure: DILATATION AND CURETTAGE /HYSTEROSCOPY;  Surgeon: Dian Queen, MD;  Location: [place];  Service: Gynecology;  Laterality: N/A;   IR US GUIDANCE  09/08/2016   POSTERIOR LUMBAR FUSION  02/ 2018    [facility] ([place])   SKIN CANCER EXCISION     TONSILLECTOMY  child   TOTAL KNEE ARTHROPLASTY Left 04/09/2015   Procedure: LEFT TOTAL KNEE ARTHROPLASTY;  Surgeon: Paralee Cancel, MD;  [facility];  Service: Orthopedics;  Laterality: Left;   TRANSTHORACIC ECHOCARDIOGRAM  01/21/2009   mild LVH, ef Q000111Q, grade 1 diastolic dysfunction  ULNAR NERVE TRANSPOSITION Left 12-20-2008  dr sypher   decompression and partial resection medial triceps fascia   WIDE LOCAL EXCISION PILONIDAL AREA  02-02-2002  [name]   recurrent squamous cell carcinoma in situ    There were no vitals filed for this visit.  Subjective Assessment - 10/12/19 1402    Subjective  Pt reports she is getting her walking in between short walks for exercise and errands. Did HEP last night without trouble.  Balance feels off today.    Pertinent History  "Erin Roach";  left TKR;  lumbar fusion; restless legs, HTN; diabetes;  mild neuropathy;  osteopenia    How long can you sit comfortably?  45 min    How long can you stand comfortably?  5 min    How long can you walk comfortably?  household mobility, 5 min    Diagnostic tests  none    Patient  Stated Goals  I would like to feel better, be more active; better quality of life, walk longer    Currently in Pain?  Yes    Pain Score  2     Pain Location  Back    Pain Orientation  Lower    Pain Descriptors / Indicators  Sore    Pain Type  Surgical pain    Pain Onset  More than a month ago    Pain Frequency  Intermittent    Aggravating Factors   prolonged walking, standing, light housework, yard work    Pain Relieving Factors  Tylenol, laying down, sitting                        [facility] Adult PT Treatment/Exercise - 10/12/19 0001      Exercises   Exercises  Lumbar      Lumbar Exercises: Stretches   Piriformis Stretch  Right;Left;Both;30 seconds;1 rep    Piriformis Stretch Limitations  pull across and push away 1x each      Lumbar Exercises: Aerobic   Nustep  L3 x seat 8 arms 10, PT present to review symptoms       Lumbar Exercises: Seated   Long Arc Quad on Chair  AROM;15 reps;Left;Right    Other Seated Lumbar Exercises  foam roller press down 5x10 sec holds, PT cued TrA      Lumbar Exercises: Supine   Bridge  10 reps;2 seconds    Bridge Limitations  2x10    Bridge with clamshell  10 reps;2 seconds    Isometric Hip Flexion  5 reps;5 seconds    Isometric Hip Flexion Limitations  bil      Lumbar Exercises: Sidelying   Clam  Both;20 reps    Clam Limitations  2x10      Knee/Hip Exercises: Stretches   Hip Flexor Stretch  Right;Left;3 reps;10 seconds    Hip Flexor Stretch Limitations  foot on 2nd step      Knee/Hip Exercises: Standing   Hip Extension  Stengthening;Both;3 sets;5 reps;Knee straight    Extension Limitations  combined with alt 3x10 sec SLS at counter          Balance Exercises - 10/12/19 1431      Balance Exercises: Standing   SLS with Vectors  Upper extremity assist 2;3 reps;10 secs        PT Education - 10/12/19 1429    Education Details  Access Code: [email or web address]: [email or web address]: 05/26/2021Prepared by:  Venetia Night BeuhringExercisesSupine Posterior Pelvic Tilt - 1 x daily - 7 x weekly -  2 sets - 10 repsSupine Piriformis Stretch with Foot on Ground - 1 x daily - 7 x weekly - 1 sets - 2 reps - 30 holdSupine Figure 4 Piriformis Stretch - 1 x daily - 7 x weekly - 1 sets - 2 reps - 30 holdHooklying Single Knee to Chest Stretch - 1 x daily - 7 x weekly - 1 sets - 2 reps - 30 holdSupine Hamstring Stretch - 1 x daily - 7 x weekly - 1 sets - 2 reps - 30 holdHooklying Isometric Hip Flexion - 1 x daily - 7 x weekly - 10 reps - 3 setsStanding Single Leg Stance with Counter Support - 1 x daily - 7 x weekly - 1 sets - 3 reps - 10 holdStanding Hip Extension with Counter Support - 1 x daily - 7 x weekly - 1 sets - 5 repsClamshell with Resistance - 1 x daily - 7 x weekly - 2 sets - 10 repsSupine Bridge with Resistance Band - 1 x daily - 7 x weekly - 2 sets - 10 reps    Person(s) Educated  Patient    Methods  Explanation;Demonstration;Verbal cues;Handout    Comprehension  Verbalized understanding;Returned demonstration       PT Short Term Goals - 10/10/19 1227      PT SHORT TERM GOAL #1   Title  Pt will be compliant and ind with initial HEP including walking 2-3 times a day for short duration as tol    Status  On-going        PT Long Term Goals - 10/04/19 1247      PT LONG TERM GOAL #1   Title  Pt will achieve at least 4+/5 for trunk and LE strength for improved tolerance of daily tasks.    Baseline  -    Time  8    Period  Weeks    Status  New    Target Date  11/29/19      PT LONG TERM GOAL #2   Title  Reduce FOTO to 55% to demo less limitation.    Baseline  69%    Time  8    Period  Weeks    Status  New    Target Date  11/29/19      PT LONG TERM GOAL #3   Title  Pt will tolerate standing and walking tasks such as light housework and running errands x 20' with pain not to exceed 5/10.    Baseline  -    Time  8    Period  Weeks    Status  New    Target Date  11/29/19      PT LONG TERM  GOAL #4   Title  Pt will be able to articulate at least two tools from HEP or behavioral modifications for pain management.    Baseline  -    Time  8    Period  Weeks    Status  New    Target Date  11/29/19      PT LONG TERM GOAL #5   Title  Pt will attend at least 1 aquatic PT session to learn ways to exercise in the pool to reduce load bearing strain while strengthening and building endurance.    Baseline  -    Time  8    Period  Weeks    Status  New    Target Date  11/29/19      PT LONG TERM GOAL #6  Title  Pt will be ind with advanced HEP and understand how to safely progress.    Baseline  -    Time  8    Period  Weeks    Status  New    Target Date  11/29/19      PT LONG TERM GOAL #7   Baseline  -            Plan - 10/12/19 1440    Clinical Impression Statement  Pt with relief from last session that has lasted.  She has more pain in chronic Lt knee than in lumbar (2/10 for back).  She arrived feeling off balance but ther ex helped and she felt more steady end of session.  PT progressed glut sets to bridges and bridges with clam, SL TrA to TrA with SL clam, and standing balance in SLS to adding hip ext.  Pt needed sequence cueing for new standing ther ex and PT updated handouts to reflect changes to HEP.  Pt reported improved Lt knee pain with seated A/ROM LAQ and with knee ranging with foot on second step lunge.  Pt will continue to benefit from skilled PT for skilled progression for lumbar post-op status.    Comorbidities  lumbar fusion revision L4/5 and new fusion L5/S1 April 2021, Lt TKR with poor outcome/pain, HTN, diabetes, asthma, osteopenia    Rehab Potential  Good    PT Frequency  2x / week    PT Duration  8 weeks    PT Treatment/Interventions  Aquatic Therapy;ADLs/Self Care Home Management;Cryotherapy;Electrical Stimulation;Moist Heat;Gait training;Stair training;Functional mobility training;Therapeutic exercise;Balance training;Neuromuscular re-education;Manual  techniques;Patient/family education;Passive range of motion;Dry needling;Spinal Manipulations;Joint Manipulations;Taping    PT Next Visit Plan  NuStep, f/u on updated to HEP, add seated LAQ, bil shoulder flex, and row next time, sit to stand from elevated table, standing posture review for STGs, watch gait for symmetry and longer step length    PT Home Exercise Plan  Access Code: [email or web address]    Consulted and Agree with Plan of Care  Patient       Patient will benefit from skilled therapeutic intervention in order to improve the following deficits and impairments:     Visit Diagnosis: Chronic bilateral low back pain without sciatica  Muscle weakness (generalized)  Other abnormalities of gait and mobility     Problem List Patient Active Problem List   Diagnosis Date Noted   Carotid arterial disease ([place]) 01/23/2019   RBBB (right bundle branch block with left anterior fascicular block)    Chest pain 01/22/2019   Weakness of both lower extremities 01/02/2019   Frequent falls 01/02/2019   OSA on CPAP 06/15/2017   Diabetes ([place]) 03/02/2017   Herpes zoster without complication 123456   B12 nutritional deficiency 02/23/2017   Vitamin D deficiency 12/08/2016   Tubular adenoma of colon 04/29/2016   Essential hypertension 09/12/2015   Allergic rhinitis 07/17/2015   Insomnia 07/17/2015   Obese 04/10/2015   S/P left TKA 04/09/2015   Osteopenia 12/04/2012   FAMILIAL TREMOR 11/18/2009   RESTLESS LEG SYNDROME 02/11/2009   LEG EDEMA, BILATERAL 02/11/2009   Hyperlipidemia 10/30/2008   Urinary incontinence 10/30/2008   Skin cancer 10/30/2008   Asthma 11/21/2007   Venous (peripheral) insufficiency 11/22/2006    Erin Roach, PT 10/12/19 2:45 PM   [facility] [address], [place], [place], [address] Phone: [phone]   Fax:  [phone]  Name: Erin Roach MRN: [id number] Date of  Birth:  04/16/1941 ° ° °

## 2019-10-18 ENCOUNTER — Ambulatory Visit: Payer: Medicare PPO | Admitting: Physical Therapy

## 2019-10-18 ENCOUNTER — Encounter: Payer: Self-pay | Admitting: Physical Therapy

## 2019-10-18 ENCOUNTER — Other Ambulatory Visit: Payer: Self-pay

## 2019-10-18 DIAGNOSIS — M25551 Pain in right hip: Secondary | ICD-10-CM | POA: Diagnosis not present

## 2019-10-18 DIAGNOSIS — M25562 Pain in left knee: Secondary | ICD-10-CM

## 2019-10-18 DIAGNOSIS — M6281 Muscle weakness (generalized): Secondary | ICD-10-CM

## 2019-10-18 DIAGNOSIS — M545 Low back pain, unspecified: Secondary | ICD-10-CM

## 2019-10-18 DIAGNOSIS — G8929 Other chronic pain: Secondary | ICD-10-CM

## 2019-10-18 DIAGNOSIS — R2689 Other abnormalities of gait and mobility: Secondary | ICD-10-CM | POA: Diagnosis not present

## 2019-10-18 NOTE — Therapy (Signed)
[facility] [address] [place], [place], [address] Phone: [phone]   Fax:  [phone]  Physical Therapy Treatment  Patient Details  Name: Erin Roach MRN: [id number] Date of Birth: 24-Oct-1940 Referring Provider (PT): Berneda Rose, [place] Georgann Housekeeper, MD)   Encounter Date: 10/18/2019  PT End of Session - 10/18/19 1017    Visit Number  4    Date for PT Re-Evaluation  11/29/19    Authorization Type  [employer]    Authorization Time Period  cohere approved 12 visits 5/26-8/17    Authorization - Visit Number  4    Authorization - Number of Visits  12    Progress Note Due on Visit  10    PT Start Time  4401    PT Stop Time  1100    PT Time Calculation (min)  44 min    Activity Tolerance  Patient tolerated treatment well;No increased pain    Behavior During Therapy  WFL for tasks assessed/performed       Past Medical History:  Diagnosis Date  . Allergic rhinitis   . Apnea   . Arthritis   . Asthma    very mild  . Back pain   . Bilateral lower extremity edema   . Carotid artery disease ([place])    a. Carotid duplex 03/2016 - duplex was stable, 02-72% RICA, 5-36% LICA  . Complication of anesthesia    post-op delirium  . Endometrial polyp   . Essential hypertension   . GERD (gastroesophageal reflux disease)   . Heart murmur    per pt  . History of adenomatous polyp of colon    04-24-2016  tubular adenoma  . History of squamous cell carcinoma excision    pilonidal area second excision 02-02-2002  . HTN (hypertension)   . Hyperlipidemia   . Leg pain   . OAB (overactive bladder)   . RA (rheumatoid arthritis) ([place])   . RBBB (right bundle branch block with left anterior fascicular block)   . RLS (restless legs syndrome)   . Shortness of breath on exertion   . SUI (stress urinary incontinence, female)   . Venous insufficiency    legs  . Vitamin D deficiency     Past Surgical History:  Procedure  Laterality Date  . APPENDECTOMY  07-02-2005   [name]   open  . CARDIAC CATHETERIZATION  01-23-2009  dr Darnell Level brodie   normal coronary arteries and lvf  . CARDIOVASCULAR STRESS TEST  03/31/2016   Low risk nuclear study w/ medium defect of mild severity in basal anterior and mid anterior location w/ no evidence ischemia or infarction/  normal LV function and wall motion,  nuclear stress ef 71%  . CATARACT EXTRACTION W/ INTRAOCULAR LENS IMPLANT Left 09/2016  . COLONOSCOPY  last one 04-24-2016  . HYSTEROSCOPY WITH D & C N/A 12/22/2016   Procedure: DILATATION AND CURETTAGE /HYSTEROSCOPY;  Surgeon: Dian Queen, MD;  Location: [facility];  Service: Gynecology;  Laterality: N/A;  . IR US GUIDANCE  09/08/2016  . POSTERIOR LUMBAR FUSION  02/ 2018    [facility] ([place])  . SKIN CANCER EXCISION    . TONSILLECTOMY  child  . 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 TRANSTHORACIC ECHOCARDIOGRAM  01/21/2009   mild LVH, ef 64-40%, grade 1 diastolic dysfunction  .  ULNAR NERVE TRANSPOSITION Left 12-20-2008  dr sypher   decompression and partial resection medial triceps fascia  . WIDE LOCAL EXCISION PILONIDAL AREA  02-02-2002  [name]   recurrent squamous cell carcinoma in situ    There were no vitals filed for this visit.  Subjective Assessment - 10/18/19 1056    Subjective  Pt arrives tired and with 4/10 lumbar pain.  HEP can make for more pain but when I do them at the clinic it makes me feel better.    Pertinent History  "[name]";  left TKR;  lumbar fusion; restless legs, HTN; diabetes;  mild neuropathy;  osteopenia    How long can you sit comfortably?  45 min    How long can you stand comfortably?  5 min    How long can you walk comfortably?  household mobility, 5 min    Diagnostic tests  none    Patient Stated Goals  I would  like to feel better, be more active; better quality of life, walk longer    Currently in Pain?  Yes    Pain Score  4     Pain Location  Back    Pain Orientation  Lower    Pain Descriptors / Indicators  Sore    Pain Type  Surgical pain    Pain Onset  More than a month ago    Pain Frequency  Intermittent    Aggravating Factors   prolonged walking, standing, light housework, yard work    Pain Relieving Factors  laying down, sitting, Tylenol                        [facility] Adult PT Treatment/Exercise - 10/18/19 0001      Exercises   Exercises  Lumbar      Lumbar Exercises: Stretches   Active Hamstring Stretch  Left;Right;1 rep;20 seconds    Active Hamstring Stretch Limitations  foot on 2nd step, hip hinge, bil rail support    Lower Trunk Rotation  5 reps    Lower Trunk Rotation Limitations  5 sec holds    Hip Flexor Stretch  Left;Right;1 rep;20 seconds    Hip Flexor Stretch Limitations  foot on 2nd step    Pelvic Tilt  10 reps    Piriformis Stretch  1 rep;20 seconds;Left;Right    Figure 4 Stretch  1 rep;20 seconds;With overpressure    Figure 4 Stretch Limitations  push knee away      Lumbar Exercises: Aerobic   Nustep  L3 x 6', seat 8      Lumbar Exercises: Seated   Long Arc Quad on Chair  AAROM;Right;Left;10 reps;2 sets    Other Seated Lumbar Exercises  foam roller press down 5x10 sec holds, PT cued TrA      Lumbar Exercises: Supine   Bridge with clamshell  20 reps   yellow   Straight Leg Raise  5 reps    Isometric Hip Flexion  5 reps;5 seconds    Isometric Hip Flexion Limitations  bil, PT cued drop shoulder blade and press with palm vs fingertips      Lumbar Exercises: Sidelying   Clam  Both;20 reps    Clam Limitations  2x10      Shoulder Exercises: Seated   Extension  Strengthening;Theraband;Both;10 reps    Extension Limitations  VCs for exhale on effort for improved TrA and sequencing    Row  Strengthening;Both;Theraband;15 reps  Balance  Exercises - 10/18/19 0001      Balance Exercises: Standing   SLS  Solid surface;Upper extremity support 2;2 reps;15 secs    Marching  Solid surface;Upper extremity assist 2;20 reps          PT Short Term Goals - 10/10/19 1227      PT SHORT TERM GOAL #1   Title  Pt will be compliant and ind with initial HEP including walking 2-3 times a day for short duration as tol    Status  On-going        PT Long Term Goals - 10/04/19 1247      PT LONG TERM GOAL #1   Title  Pt will achieve at least 4+/5 for trunk and LE strength for improved tolerance of daily tasks.    Baseline  -    Time  8    Period  Weeks    Status  New    Target Date  11/29/19      PT LONG TERM GOAL #2   Title  Reduce FOTO to 55% to demo less limitation.    Baseline  69%    Time  8    Period  Weeks    Status  New    Target Date  11/29/19      PT LONG TERM GOAL #3   Title  Pt will tolerate standing and walking tasks such as light housework and running errands x 20' with pain not to exceed 5/10.    Baseline  -    Time  8    Period  Weeks    Status  New    Target Date  11/29/19      PT LONG TERM GOAL #4   Title  Pt will be able to articulate at least two tools from HEP or behavioral modifications for pain management.    Baseline  -    Time  8    Period  Weeks    Status  New    Target Date  11/29/19      PT LONG TERM GOAL #5   Title  Pt will attend at least 1 aquatic PT session to learn ways to exercise in the pool to reduce load bearing strain while strengthening and building endurance.    Baseline  -    Time  8    Period  Weeks    Status  New    Target Date  11/29/19      PT LONG TERM GOAL #6   Title  Pt will be ind with advanced HEP and understand how to safely progress.    Baseline  -    Time  8    Period  Weeks    Status  New    Target Date  11/29/19      PT LONG TERM GOAL #7   Baseline  -            Plan - 10/18/19 1052    Clinical Impression Statement  Pt continues to make  progress within sessions for improved mobility and core awareness.  She arrived with 2/10 Lt knee and 4/10 lumbar region pain which improved with exercsie today.  She needs frequent VCs for body alignment for technique.  PT progressed HEP to include seated LAQ and lower trunk rotation today.  Introduced supine SLR and seated UE tband ther ex with cues for trunk alignment.  Pt has poor balance and requires bil UE support with SLS, marching  and LE standing stretches.  Continue along POC with ongoing progression of strength and stabilization.    Comorbidities  lumbar fusion revision L4/5 and new fusion L5/S1 April 2021, Lt TKR with poor outcome/pain, HTN, diabetes, asthma, osteopenia    PT Frequency  2x / week    PT Duration  8 weeks    PT Treatment/Interventions  Aquatic Therapy;ADLs/Self Care Home Management;Cryotherapy;Electrical Stimulation;Moist Heat;Gait training;Stair training;Functional mobility training;Therapeutic exercise;Balance training;Neuromuscular re-education;Manual techniques;Patient/family education;Passive range of motion;Dry needling;Spinal Manipulations;Joint Manipulations;Taping    PT Next Visit Plan  NuStep, supine and seated core and LE/UE strength, continue SLS and marching with UE support    PT Home Exercise Plan  Access Code: [email or web address]    Consulted and Agree with Plan of Care  Patient       Patient will benefit from skilled therapeutic intervention in order to improve the following deficits and impairments:     Visit Diagnosis: Chronic bilateral low back pain without sciatica  Muscle weakness (generalized)  Other abnormalities of gait and mobility  Chronic pain of left knee     Problem List Patient Active Problem List   Diagnosis Date Noted  . Carotid arterial disease ([place]) 01/23/2019  . RBBB (right bundle branch block with left anterior fascicular block)   . Chest pain 01/22/2019  . Weakness of both lower extremities 01/02/2019  . Frequent falls 01/02/2019  .  OSA on CPAP 06/15/2017  . Diabetes ([place]) 03/02/2017  . Herpes zoster without complication 49/20/1007  . B12 nutritional deficiency 02/23/2017  . Vitamin D deficiency 12/08/2016  . Tubular adenoma of colon 04/29/2016  . Essential hypertension 09/12/2015  . Allergic rhinitis 07/17/2015  . Insomnia 07/17/2015  . Obese 04/10/2015  . S/P left TKA 04/09/2015  . Osteopenia 12/04/2012  . 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 RESTLESS LEG SYNDROME 02/11/2009  . LEG EDEMA, BILATERAL 02/11/2009  . Hyperlipidemia 10/30/2008  . Urinary incontinence 10/30/2008  . Skin cancer 10/30/2008  . Asthma 11/21/2007  . Venous (peripheral) insufficiency 11/22/2006   Baruch Merl, PT 10/18/19 10:58 AM   [facility] [address] [place], [place], [address] Phone: [phone]   Fax:  [phone]  Name: Erin Roach MRN: [id number] Date of Birth: Jan 02, 1941

## 2019-10-19 ENCOUNTER — Other Ambulatory Visit (INDEPENDENT_AMBULATORY_CARE_PROVIDER_SITE_OTHER): Payer: Self-pay | Admitting: Family Medicine

## 2019-10-19 DIAGNOSIS — E559 Vitamin D deficiency, unspecified: Secondary | ICD-10-CM

## 2019-10-20 ENCOUNTER — Other Ambulatory Visit: Payer: Self-pay | Admitting: Internal Medicine

## 2019-10-25 ENCOUNTER — Encounter: Payer: Self-pay | Admitting: Pulmonary Disease

## 2019-10-25 ENCOUNTER — Other Ambulatory Visit: Payer: Self-pay

## 2019-10-25 ENCOUNTER — Ambulatory Visit: Payer: Medicare PPO | Admitting: Pulmonary Disease

## 2019-10-25 DIAGNOSIS — Z9989 Dependence on other enabling machines and devices: Secondary | ICD-10-CM | POA: Diagnosis not present

## 2019-10-25 DIAGNOSIS — G4733 Obstructive sleep apnea (adult) (pediatric): Secondary | ICD-10-CM | POA: Diagnosis not present

## 2019-10-25 DIAGNOSIS — G2581 Restless legs syndrome: Secondary | ICD-10-CM | POA: Diagnosis not present

## 2019-10-25 DIAGNOSIS — D509 Iron deficiency anemia, unspecified: Secondary | ICD-10-CM | POA: Diagnosis not present

## 2019-10-25 LAB — TIQ-MISC

## 2019-10-25 LAB — FERRITIN: Ferritin: 28 ng/mL (ref 10.0–291.0)

## 2019-10-25 MED ORDER — ROPINIROLE HCL 0.5 MG PO TABS: 0.5000 mg | ORAL_TABLET | Freq: Every day | ORAL | 1 refills | Status: DC

## 2019-10-25 NOTE — Progress Notes (Signed)
Subjective:    Patient ID: Erin Roach, female    DOB: [birth year], 79 y.o.   MRN: [id number]  HPI   79 yo whom I have seen more than 3 years ago for severe restless leg syndrome presents to reestablish care Onset around age 9s after pregnancy She has been seen by neurology and underwent evaluation including EMG that was negative.   She had remarkable benefits with relaxis -mechanical therapy She was able to come off Klonopin, hydrocodone and Neurontin  Medications-  She has been on Requip, clonazepam, Neupro (site reaction) and Horizant in the past Currently on Horizant and Lyrica  Accompanied by her husband Clare Gandy today Since her last visit with me in 2018, she underwent left TKR and lumbar 4/5 fusion and had a sciatic nerve cyst that was removed.  She remains on hydrocodone for back pain and restless legs symptoms. Her symptoms of restless legs has gotten considerably worse.  She had sleep study done in 2018 at [facility] and then in 2019 at titration study with [facility], results of which are not available to me.  She states that she sleeps in a recliner and is not very compliant with her CPAP machine, settings are 9 to 12 cm. She is concerned that the company that made relaxis has gone out of business and she would not get any replacement parts or new machine if she needed.  RLS symptoms start as early as in the morning, does not have upper extremity symptoms, mostly medications help except on some occasions. Epworth sleepiness score is 6 Bedtime is around 10 PM, sleeps in a recliner, several nocturnal awakenings including nocturia and is out of bed by 7 AM       Past Medical History:  Diagnosis Date  . Allergic rhinitis   . Apnea   . Arthritis   . Asthma    very mild  . Back pain   . Bilateral lower extremity edema   . Carotid artery disease ([place])    a. Carotid duplex 03/2016 - duplex was stable, 29-47% RICA, 6-54% LICA  . Complication of anesthesia    post-op  delirium  . Endometrial polyp   . Essential hypertension   . GERD (gastroesophageal reflux disease)   . Heart murmur    per pt  . History of adenomatous polyp of colon    04-24-2016  tubular adenoma  . History of squamous cell carcinoma excision    pilonidal area second excision 02-02-2002  . HTN (hypertension)   . Hyperlipidemia   . Leg pain   . OAB (overactive bladder)   . RA (rheumatoid arthritis) ([place])   . RBBB (right bundle branch block with left anterior fascicular block)   . RLS (restless legs syndrome)   . Shortness of breath on exertion   . SUI (stress urinary incontinence, female)   . Venous insufficiency    legs  . Vitamin D deficiency    Past Surgical History:  Procedure Laterality Date  . APPENDECTOMY  07-02-2005   [name]   open  . CARDIAC CATHETERIZATION  01-23-2009  dr Darnell Level brodie   normal coronary arteries and lvf  . CARDIOVASCULAR STRESS TEST  03/31/2016   Low risk nuclear study w/ medium defect of mild severity in basal anterior and mid anterior location w/ no evidence ischemia or infarction/  normal LV function and wall motion,  nuclear stress ef 71%  . CATARACT EXTRACTION W/ INTRAOCULAR LENS IMPLANT Left 09/2016  . COLONOSCOPY  last one  04-24-2016  . HYSTEROSCOPY WITH D & C N/A 12/22/2016   Procedure: DILATATION AND CURETTAGE /HYSTEROSCOPY;  Surgeon: Dian Queen, MD;  Location: [facility];  Service: Gynecology;  Laterality: N/A;  . IR US GUIDANCE  09/08/2016  . POSTERIOR LUMBAR FUSION  02/ 2018    [facility] ([place])  . SKIN CANCER EXCISION    . TONSILLECTOMY  child  . 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 TRANSTHORACIC ECHOCARDIOGRAM  01/21/2009   mild LVH, ef 00-93%, grade 1 diastolic dysfunction  . ULNAR NERVE TRANSPOSITION Left 12-20-2008  dr sypher   decompression and partial  resection medial triceps fascia  . WIDE LOCAL EXCISION PILONIDAL AREA  02-02-2002  [name]   recurrent squamous cell carcinoma in situ    No Known Allergies  Social History   Socioeconomic History  . Marital status: Married    Spouse name: Clare Gandy  . Number of children: 2  . Years of education: Not on file  . Highest education level: Not on file  Occupational History  . Occupation: retired Pharmacist, hospital  Tobacco Use  . Smoking status: Former Smoker    Packs/day: 0.50    Years: 10.00    Pack years: 5.00    Types: Cigarettes    Quit date: 12/10/1973    Years since quitting: 45.9  . Smokeless tobacco: Never Used  Vaping Use  . Vaping Use: Never used  Substance and Sexual Activity  . Alcohol use: Yes    Comment: occasional  . Drug use: No  . Sexual activity: Not on file  Other Topics Concern  . Not on file  Social History Narrative   2 children ages 10,35   Social Determinants of Health   Financial Resource Strain:   . Difficulty of Paying Living Expenses:   Food Insecurity:   . Worried About Charity fundraiser in the Last Year:   . Arboriculturist in the Last Year:   Transportation Needs:   . Film/video editor (Medical):   [name] Lack of Transportation (Non-Medical):   Physical Activity:   . Days of Exercise per Week:   . Minutes of Exercise per Session:   Stress:   . Feeling of Stress :   Social Connections:   . Frequency of Communication with Friends and Family:   . Frequency of Social Gatherings with Friends and Family:   . Attends Religious Services:   . Active Member of Clubs or Organizations:   . Attends Archivist Meetings:   [name] Marital Status:   Intimate Partner Violence:   . Fear of Current or Ex-Partner:   . Emotionally Abused:   [name] Physically Abused:   . Sexually Abused:     Family History  Problem Relation Age of Onset  . Esophageal cancer Father   . Heart disease Father        Unknown what kind  . Hyperlipidemia Father   .  Hypertension Father   . Cancer Father   . Heart failure Father   . Hyperlipidemia Mother   . Hypertension Mother   . CAD Sister        33 years younger than patient - had stent. Was told that her anorexia/bulimia may have played a role  . Stroke Maternal Grandmother 66  . Diabetes Maternal Grandmother   . Heart attack Maternal Grandfather 93  .  COPD Maternal Grandfather   . Heart attack Paternal Grandmother        20s  . Diabetes Other        MGGM    Review of Systems Constitutional: negative for anorexia, fevers and sweats  Eyes: negative for irritation, redness and visual disturbance  Ears, nose, mouth, throat, and face: negative for earaches, epistaxis, nasal congestion and sore throat  Respiratory: negative for cough, dyspnea on exertion, sputum and wheezing  Cardiovascular: negative for chest pain, dyspnea, lower extremity edema, orthopnea, palpitations and syncope  Gastrointestinal: negative for abdominal pain, constipation, diarrhea, melena, nausea and vomiting  Genitourinary:negative for dysuria, frequency and hematuria  Hematologic/lymphatic: negative for bleeding, easy bruising and lymphadenopathy  Musculoskeletal:negative for arthralgias, muscle weakness and stiff joints  Neurological: + coordination problems, gait problems, neg for headaches and weakness  Endocrine: negative for diabetic symptoms including polydipsia, polyuria and weight loss     Objective:   Physical Exam  Gen. Pleasant, obese, in no distress, normal affect ENT - no pallor,icterus, no post nasal drip, class 2-3 airway Neck: No JVD, no thyromegaly, no carotid bruits Lungs: no use of accessory muscles, no dullness to percussion, decreased without rales or rhonchi  Cardiovascular: Rhythm regular, heart sounds  normal, no murmurs or gallops, no peripheral edema Abdomen: soft and non-tender, no hepatosplenomegaly, BS normal. Musculoskeletal: No deformities, no cyanosis or clubbing Neuro:  alert, non  focal, no tremors       Assessment & Plan:

## 2019-10-25 NOTE — Assessment & Plan Note (Signed)
Obtain sleep studies from [place] to review effect of CPAP on restless legs during sleep Based on this we will emphasize need for CPAP during sleep

## 2019-10-25 NOTE — Patient Instructions (Signed)
  Blood work today -iron/TIBC and ferritin levels Obtain sleep studies from [place] to review effect of CPAP on restless legs during sleep  Trial of Requip -started 0.5 mg at 9 PM, increase by 0.5 mg every 3 nights to maximum dose of 2 mg #60 Once symptoms of restless legs are controlled, can attempt to decrease hydrocodone dose

## 2019-10-25 NOTE — Assessment & Plan Note (Addendum)
Her RLS symptoms have been worse over the past year, this is causing her considerable distress and disrupting her life, no clear trigger, questionable back surgery versus iron deficiency versus other No historical evidence of augmentation although this is possible She is on Horizant, hydrocodone and Lyrica, dopaminergic agonist was to have dropped off her list  Blood work today -iron/TIBC and ferritin levels  Trial of Requip -started 0.5 mg at 9 PM, increase by 0.5 mg every 3 nights to maximum dose of 2 mg #60 Once symptoms of restless legs are controlled, can attempt to decrease hydrocodone dose  Alternatively we could try mechanical therapy in the future such as compression socks or restiffic foot wraps

## 2019-10-26 ENCOUNTER — Telehealth: Payer: Self-pay | Admitting: Pulmonary Disease

## 2019-10-26 ENCOUNTER — Encounter: Payer: Self-pay | Admitting: Internal Medicine

## 2019-10-26 LAB — TEST AUTHORIZATION

## 2019-10-26 LAB — IRON, TOTAL/TOTAL IRON BINDING CAP
%SAT: 15 % (calc) — ABNORMAL LOW (ref 16–45)
Iron: 56 ug/dL (ref 45–160)
TIBC: 364 mcg/dL (calc) (ref 250–450)

## 2019-10-26 NOTE — Telephone Encounter (Signed)
I received these documents and gave to Dr. Elsworth Soho. Nothing further is needed.

## 2019-10-26 NOTE — Telephone Encounter (Signed)
Called [facility] at [phone]. Spoke with Lysbeth Galas. States that pt's last sleep study was done in 2018 (diganotisc) and 2019 (CPAP titration). He is going to fax that over to [name]. Will route to Jonelle Sidle to keep an eye out for these documents.

## 2019-10-27 ENCOUNTER — Encounter: Payer: Self-pay | Admitting: Internal Medicine

## 2019-10-27 DIAGNOSIS — E119 Type 2 diabetes mellitus without complications: Secondary | ICD-10-CM

## 2019-10-27 DIAGNOSIS — E559 Vitamin D deficiency, unspecified: Secondary | ICD-10-CM

## 2019-10-27 MED ORDER — HYDROCODONE-ACETAMINOPHEN 5-325 MG PO TABS: 1.0000 | ORAL_TABLET | Freq: Every day | ORAL | 0 refills | Status: DC

## 2019-10-27 NOTE — Telephone Encounter (Signed)
Done erx 

## 2019-10-29 MED ORDER — VICTOZA 18 MG/3ML ~~LOC~~ SOPN: PEN_INJECTOR | SUBCUTANEOUS | 3 refills | Status: DC

## 2019-10-29 MED ORDER — METFORMIN HCL 500 MG PO TABS: ORAL_TABLET | ORAL | 1 refills | Status: DC

## 2019-10-30 ENCOUNTER — Other Ambulatory Visit (INDEPENDENT_AMBULATORY_CARE_PROVIDER_SITE_OTHER): Payer: Self-pay | Admitting: Family Medicine

## 2019-10-30 ENCOUNTER — Telehealth: Payer: Self-pay | Admitting: Pulmonary Disease

## 2019-10-30 DIAGNOSIS — E119 Type 2 diabetes mellitus without complications: Secondary | ICD-10-CM

## 2019-10-30 MED ORDER — ERGOCALCIFEROL 1.25 MG (50000 UT) PO CAPS: 50000.0000 [IU] | ORAL_CAPSULE | ORAL | 0 refills | Status: DC

## 2019-10-30 NOTE — Addendum Note (Signed)
Addended by: Binnie Rail on: 10/30/2019 02:29 PM   Modules accepted: Orders

## 2019-10-30 NOTE — Telephone Encounter (Signed)
Reviewed  NPSG 03/2017 severe OSA, AHI 27/h , no PLMs 03/2018 PAP titratio >> 11 cm , no PLMs, LM index 5.7/h

## 2019-10-31 ENCOUNTER — Telehealth: Payer: Self-pay | Admitting: Pulmonary Disease

## 2019-10-31 NOTE — Telephone Encounter (Signed)
Patient called with results of recent iron studies. Patient verbalized understanding of results.

## 2019-11-01 ENCOUNTER — Ambulatory Visit: Payer: Medicare PPO | Admitting: Physical Therapy

## 2019-11-01 ENCOUNTER — Other Ambulatory Visit: Payer: Self-pay

## 2019-11-01 ENCOUNTER — Encounter: Payer: Self-pay | Admitting: Physical Therapy

## 2019-11-01 DIAGNOSIS — M545 Low back pain, unspecified: Secondary | ICD-10-CM

## 2019-11-01 DIAGNOSIS — G8929 Other chronic pain: Secondary | ICD-10-CM

## 2019-11-01 DIAGNOSIS — M25551 Pain in right hip: Secondary | ICD-10-CM | POA: Diagnosis not present

## 2019-11-01 DIAGNOSIS — M6281 Muscle weakness (generalized): Secondary | ICD-10-CM | POA: Diagnosis not present

## 2019-11-01 DIAGNOSIS — R2689 Other abnormalities of gait and mobility: Secondary | ICD-10-CM | POA: Diagnosis not present

## 2019-11-01 DIAGNOSIS — M25562 Pain in left knee: Secondary | ICD-10-CM | POA: Diagnosis not present

## 2019-11-01 NOTE — Therapy (Signed)
[facility] [address] [place], [place], [address] Phone: [phone]   Fax:  [phone]  Physical Therapy Treatment  Patient Details  Name: Erin Roach MRN: [id number] Date of Birth: 27-Jun-1940 Referring Provider (PT): Berneda Rose, [place] Georgann Housekeeper, MD)   Encounter Date: 11/01/2019   PT End of Session - 11/01/19 1055    Visit Number 5    Date for PT Re-Evaluation 11/29/19    Authorization Type [employer]    Authorization Time Period cohere approved 12 visits 5/26-8/17    Authorization - Visit Number 5    Authorization - Number of Visits 12    Progress Note Due on Visit 10    PT Start Time 5809    PT Stop Time 1059    PT Time Calculation (min) 44 min    Activity Tolerance Patient tolerated treatment well;No increased pain    Behavior During Therapy WFL for tasks assessed/performed           Past Medical History:  Diagnosis Date   Allergic rhinitis    Apnea    Arthritis    Asthma    very mild   Back pain    Bilateral lower extremity edema    Carotid artery disease (HCC)    a. Carotid duplex 03/2016 - duplex was stable, 98-33% RICA, 8-25% LICA   Complication of anesthesia    post-op delirium   Endometrial polyp    Essential hypertension    GERD (gastroesophageal reflux disease)    Heart murmur    per pt   History of adenomatous polyp of colon    04-24-2016  tubular adenoma   History of squamous cell carcinoma excision    pilonidal area second excision 02-02-2002   HTN (hypertension)    Hyperlipidemia    Leg pain    OAB (overactive bladder)    RA (rheumatoid arthritis) (HCC)    RBBB (right bundle branch block with left anterior fascicular block)    RLS (restless legs syndrome)    Shortness of breath on exertion    SUI (stress urinary incontinence, female)    Venous insufficiency    legs   Vitamin D deficiency     Past Surgical History:  Procedure  Laterality Date   APPENDECTOMY  07-02-2005   [name]   open   CARDIAC CATHETERIZATION  01-23-2009  dr Darnell Level brodie   normal coronary arteries and lvf   CARDIOVASCULAR STRESS TEST  03/31/2016   Low risk nuclear study w/ medium defect of mild severity in basal anterior and mid anterior location w/ no evidence ischemia or infarction/  normal LV function and wall motion,  nuclear stress ef 71%   CATARACT EXTRACTION W/ INTRAOCULAR LENS IMPLANT Left 09/2016   COLONOSCOPY  last one 04-24-2016   HYSTEROSCOPY WITH D & C N/A 12/22/2016   Procedure: DILATATION AND CURETTAGE /HYSTEROSCOPY;  Surgeon: Dian Queen, MD;  Location: [facility];  Service: Gynecology;  Laterality: N/A;   IR US GUIDANCE  09/08/2016   POSTERIOR LUMBAR FUSION  02/ 2018    [facility] ([place])   SKIN CANCER EXCISION     TONSILLECTOMY  child   TOTAL KNEE ARTHROPLASTY Left 04/09/2015   Procedure: LEFT TOTAL KNEE ARTHROPLASTY;  Surgeon: Paralee Cancel, MD;  Location: [facility];  Service: Orthopedics;  Laterality: Left;   TRANSTHORACIC ECHOCARDIOGRAM  01/21/2009   mild LVH, ef 05-39%, grade 1 diastolic dysfunction   ULNAR NERVE TRANSPOSITION Left 12-20-2008  dr sypher   decompression and partial resection medial triceps fascia   WIDE LOCAL EXCISION PILONIDAL AREA  02-02-2002  [name]   recurrent squamous cell carcinoma in situ    There were no vitals filed for this visit.   Subjective Assessment - 11/01/19 1019    Subjective Pt states that her back feels ok today. Her knee is hurting her right now.    Pertinent History "Consuelo";  left TKR;  lumbar fusion; restless legs, HTN; diabetes;  mild neuropathy;  osteopenia    How long can you sit comfortably? 45 min    How long can you stand comfortably? 5 min    How long can you walk comfortably? household mobility, 5 min    Diagnostic tests none    Patient Stated Goals I would like to feel better, be more active; better quality of life,  walk longer    Currently in Pain? Yes    Pain Score 5     Pain Location Knee    Pain Orientation Left;Anterior    Pain Descriptors / Indicators Sore    Pain Onset More than a month ago                             [facility] Adult PT Treatment/Exercise - 11/01/19 0001      Lumbar Exercises: Standing   Other Standing Lumbar Exercises BUE pressdown with red TB 2x10 reps       Lumbar Exercises: Seated   Sit to Stand 15 reps    Sit to Stand Limitations cuing to avoid knee valgus     Other Seated Lumbar Exercises abdominal brace with hip flexion x10 reps each       Knee/Hip Exercises: Stretches   Hip Flexor Stretch Both;2 reps;20 seconds    Hip Flexor Stretch Limitations standing at step       Knee/Hip Exercises: Machines for Strengthening   Total Gym Leg Press Seat 7: Lt LE only #30 2x10 reps       Knee/Hip Exercises: Seated   Long Arc Quad Limitations Lt quad isometric 10x3 sec hold in 45 deg and 90 deg flexion      Knee/Hip Exercises: Supine   Writer;Both    Straight Leg Raises Left;Strengthening;1 set;10 reps    Straight Leg Raises Limitations abdominal bracing                Balance Exercises - 11/01/19 0001      Balance Exercises: Standing   SLS Solid surface;Upper extremity support 2;2 reps;15 secs             PT Education - 11/01/19 1055    Education Details technique with therex; updates to HEP    Person(s) Educated Patient    Methods Explanation;Handout;Verbal cues    Comprehension Verbalized understanding;Returned demonstration            PT Short Term Goals - 10/10/19 1227      PT SHORT TERM GOAL #1   Title Pt will be compliant and ind with initial HEP including walking 2-3 times a day for short duration as tol    Status On-going             PT Long Term Goals - 10/04/19 1247      PT LONG TERM GOAL #1   Title Pt will achieve at least 4+/5 for trunk and LE strength for improved tolerance of daily tasks.  Baseline -    Time 8    Period Weeks    Status New    Target Date 11/29/19      PT LONG TERM GOAL #2   Title Reduce FOTO to 55% to demo less limitation.    Baseline 69%    Time 8    Period Weeks    Status New    Target Date 11/29/19      PT LONG TERM GOAL #3   Title Pt will tolerate standing and walking tasks such as light housework and running errands x 20' with pain not to exceed 5/10.    Baseline -    Time 8    Period Weeks    Status New    Target Date 11/29/19      PT LONG TERM GOAL #4   Title Pt will be able to articulate at least two tools from HEP or behavioral modifications for pain management.    Baseline -    Time 8    Period Weeks    Status New    Target Date 11/29/19      PT LONG TERM GOAL #5   Title Pt will attend at least 1 aquatic PT session to learn ways to exercise in the pool to reduce load bearing strain while strengthening and building endurance.    Baseline -    Time 8    Period Weeks    Status New    Target Date 11/29/19      PT LONG TERM GOAL #6   Title Pt will be ind with advanced HEP and understand how to safely progress.    Baseline -    Time 8    Period Weeks    Status New    Target Date 11/29/19      PT LONG TERM GOAL #7   Baseline -                 Plan - 11/01/19 1059    Clinical Impression Statement Pt continues to have issues with the Lt knee and low back following HEP adherence at home. She feels good following her sessions and is concerned she is doing something incorrectly. PT reviewed pt's current HEP to make some adjustments. Pt had LT quadriceps fatigue with single leg press but denied pain with this. Will continue with current POC.    Comorbidities lumbar fusion revision L4/5 and new fusion L5/S1 April 2021, Lt TKR with poor outcome/pain, HTN, diabetes, asthma, osteopenia    PT Frequency 2x / week    PT Duration 8 weeks    PT Treatment/Interventions Aquatic Therapy;ADLs/Self Care Home  Management;Cryotherapy;Electrical Stimulation;Moist Heat;Gait training;Stair training;Functional mobility training;Therapeutic exercise;Balance training;Neuromuscular re-education;Manual techniques;Patient/family education;Passive range of motion;Dry needling;Spinal Manipulations;Joint Manipulations;Taping    PT Next Visit Plan NuStep, supine and seated core and LE/UE strength, continue SLS and marching with UE support    PT Home Exercise Plan Access Code: [email or web address]    Consulted and Agree with Plan of Care Patient           Patient will benefit from skilled therapeutic intervention in order to improve the following deficits and impairments:     Visit Diagnosis: Chronic bilateral low back pain without sciatica  Muscle weakness (generalized)  Other abnormalities of gait and mobility  Chronic pain of left knee     Problem List Patient Active Problem List   Diagnosis Date Noted   Carotid arterial disease ([place]) 01/23/2019   RBBB (right bundle branch block with  left anterior fascicular block)    Chest pain 01/22/2019   Weakness of both lower extremities 01/02/2019   Frequent falls 01/02/2019   OSA on CPAP 06/15/2017   Diabetes ([place]) 03/02/2017   Herpes zoster without complication 75/30/0511   B12 nutritional deficiency 02/23/2017   Vitamin D deficiency 12/08/2016   Tubular adenoma of colon 04/29/2016   Essential hypertension 09/12/2015   Allergic rhinitis 07/17/2015   Insomnia 07/17/2015   Obese 04/10/2015   S/P left TKA 04/09/2015   Osteopenia 12/04/2012   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 [place], [place], [address] Phone: [phone]   Fax:  [phone]  Name: Erin Roach MRN: [id number] Date of Birth: 1940/06/26

## 2019-11-01 NOTE — Patient Instructions (Signed)
Access Code: [email or web address] URL: [email or web address] Date: 11/01/2019 Prepared by: Lima  Exercises Hooklying Isometric Hip Flexion - 1 x daily - 7 x weekly - 10 reps - 2 sets Standing Single Leg Stance with Counter Support - 1 x daily - 7 x weekly - 1 sets - 3 reps - 10 hold Clamshell with Resistance - 1 x daily - 7 x weekly - 2 sets - 10 reps Supine Bridge with Resistance Band - 1 x daily - 7 x weekly - 2 sets - 10 reps Seated Quad Set - 1 x daily - 7 x weekly - 10 reps - 3 seconds hold Supine Hip Internal and External Rotation - 2 x daily - 7 x weekly - 1 sets - 10 reps  [facility] [address] [place], [place] [address] Phone # [phone] Fax [phone]

## 2019-11-02 ENCOUNTER — Ambulatory Visit: Payer: Medicare PPO | Admitting: Physical Therapy

## 2019-11-03 ENCOUNTER — Ambulatory Visit: Payer: Medicare PPO | Admitting: Physical Therapy

## 2019-11-03 ENCOUNTER — Other Ambulatory Visit: Payer: Self-pay

## 2019-11-03 ENCOUNTER — Encounter: Payer: Self-pay | Admitting: Physical Therapy

## 2019-11-03 DIAGNOSIS — M6281 Muscle weakness (generalized): Secondary | ICD-10-CM

## 2019-11-03 DIAGNOSIS — M25562 Pain in left knee: Secondary | ICD-10-CM

## 2019-11-03 DIAGNOSIS — M545 Low back pain, unspecified: Secondary | ICD-10-CM

## 2019-11-03 DIAGNOSIS — G8929 Other chronic pain: Secondary | ICD-10-CM

## 2019-11-03 DIAGNOSIS — R2689 Other abnormalities of gait and mobility: Secondary | ICD-10-CM | POA: Diagnosis not present

## 2019-11-03 DIAGNOSIS — M25551 Pain in right hip: Secondary | ICD-10-CM | POA: Diagnosis not present

## 2019-11-03 NOTE — Therapy (Signed)
[facility] [address], [place], [place], [address] Phone: [phone]   Fax:  [phone]  Physical Therapy Treatment  Patient Details  Name: Erin Roach MRN: [id number] Date of Birth: 08-12-1940 Referring Provider (PT): Berneda Rose, [place] Georgann Housekeeper, MD)   Encounter Date: 11/03/2019   PT End of Session - 11/03/19 1106    Visit Number 6    Date for PT Re-Evaluation 11/29/19    Authorization Type [employer]    Authorization Time Period cohere approved 12 visits 5/26-8/17    Authorization - Visit Number 6    Authorization - Number of Visits 12    Progress Note Due on Visit 10    PT Start Time 1102    PT Stop Time 3154    PT Time Calculation (min) 41 min    Activity Tolerance Patient tolerated treatment well;No increased pain    Behavior During Therapy WFL for tasks assessed/performed           Past Medical History:  Diagnosis Date  . Allergic rhinitis   . Apnea   . Arthritis   . Asthma    very mild  . Back pain   . Bilateral lower extremity edema   . Carotid artery disease ([place])    a. Carotid duplex 03/2016 - duplex was stable, 00-86% RICA, 7-61% LICA  . Complication of anesthesia    post-op delirium  . Endometrial polyp   . Essential hypertension   . GERD (gastroesophageal reflux disease)   . Heart murmur    per pt  . History of adenomatous polyp of colon    04-24-2016  tubular adenoma  . History of squamous cell carcinoma excision    pilonidal area second excision 02-02-2002  . HTN (hypertension)   . Hyperlipidemia   . Leg pain   . OAB (overactive bladder)   . RA (rheumatoid arthritis) ([place])   . RBBB (right bundle branch block with left anterior fascicular block)   . RLS (restless legs syndrome)   . Shortness of breath on exertion   . SUI (stress urinary incontinence, female)   . Venous insufficiency    legs  . Vitamin D deficiency     Past Surgical History:  Procedure  Laterality Date  . APPENDECTOMY  07-02-2005   [name]   open  . CARDIAC CATHETERIZATION  01-23-2009  dr Darnell Level brodie   normal coronary arteries and lvf  . CARDIOVASCULAR STRESS TEST  03/31/2016   Low risk nuclear study w/ medium defect of mild severity in basal anterior and mid anterior location w/ no evidence ischemia or infarction/  normal LV function and wall motion,  nuclear stress ef 71%  . CATARACT EXTRACTION W/ INTRAOCULAR LENS IMPLANT Left 09/2016  . COLONOSCOPY  last one 04-24-2016  . HYSTEROSCOPY WITH D & C N/A 12/22/2016   Procedure: DILATATION AND CURETTAGE /HYSTEROSCOPY;  Surgeon: Dian Queen, MD;  Location: [facility];  Service: Gynecology;  Laterality: N/A;  . IR US GUIDANCE  09/08/2016  . POSTERIOR LUMBAR FUSION  02/ 2018    [facility] ([place])  . SKIN CANCER EXCISION    . TONSILLECTOMY  child  . 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 TRANSTHORACIC ECHOCARDIOGRAM  01/21/2009   mild LVH, ef 95-09%, grade 1 diastolic dysfunction  . ULNAR NERVE TRANSPOSITION Left 12-20-2008  dr sypher   decompression and partial resection medial triceps fascia  . WIDE LOCAL EXCISION PILONIDAL AREA  02-02-2002  [name]   recurrent squamous cell carcinoma in situ    There were no vitals filed for this visit.   Subjective Assessment - 11/03/19 1104    Subjective Knee pain is 4/10.  No back pain now but had some when I woke up.    Pertinent History "[place]";  left TKR;  lumbar fusion; restless legs, HTN; diabetes;  mild neuropathy;  osteopenia    How long can you sit comfortably? 45 min    How long can you stand comfortably? 5 min    How long can you walk comfortably? household mobility, 5 min    Diagnostic tests none    Patient Stated Goals I would like to feel better, be more active; better quality of life, walk  longer    Currently in Pain? Yes    Pain Score 4     Pain Location Knee    Pain Orientation Left;Anterior    Pain Descriptors / Indicators Sore    Pain Type Surgical pain    Pain Onset More than a month ago    Pain Frequency Intermittent    Aggravating Factors  prolonged walking, standing                             [facility] Adult PT Treatment/Exercise - 11/03/19 0001      Exercises   Exercises Lumbar;Knee/Hip      Lumbar Exercises: Stretches   Lower Trunk Rotation 5 reps    Other Lumbar Stretch Exercise bil hip IR/ER supine x 5 cycles bil      Lumbar Exercises: Aerobic   Nustep L3 x 8', PT present to review HEP questions      Lumbar Exercises: Supine   Bridge with clamshell 10 reps    Bridge with [employer] Limitations red band    Isometric Hip Flexion 5 reps;5 seconds    Isometric Hip Flexion Limitations bil    Other Supine Lumbar Exercises supine red band clam x 15 pulses before adding bridge      Lumbar Exercises: Sidelying   Clam Both;15 reps    Clam Limitations red band      Knee/Hip Exercises: Machines for Strengthening   Total Gym Leg Press Seat 7: Lt LE only #30 20 reps, 70lb bil LEs x 10       Knee/Hip Exercises: Seated   Long Arc Quad Limitations Lt quad isometric 10x3 sec hold in 45 deg and 90 deg flexion   reviewed from HEP, Pt had difficulty with 45 deg quad aware              Balance Exercises - 11/03/19 0001      Balance Exercises: Standing   Marching Foam/compliant surface;Upper extremity assist 2;20 reps   two fingers each hand              PT Short Term Goals - 10/10/19 1227      PT SHORT TERM GOAL #1   Title Pt will be compliant and ind with initial HEP including walking 2-3 times a day for short duration as tol    Status On-going             PT Long Term Goals - 10/04/19 1247      PT LONG TERM GOAL #1   Title Pt will achieve at least 4+/5  for trunk and LE strength for improved tolerance of daily tasks.      Baseline -    Time 8    Period Weeks    Status New    Target Date 11/29/19      PT LONG TERM GOAL #2   Title Reduce FOTO to 55% to demo less limitation.    Baseline 69%    Time 8    Period Weeks    Status New    Target Date 11/29/19      PT LONG TERM GOAL #3   Title Pt will tolerate standing and walking tasks such as light housework and running errands x 20' with pain not to exceed 5/10.    Baseline -    Time 8    Period Weeks    Status New    Target Date 11/29/19      PT LONG TERM GOAL #4   Title Pt will be able to articulate at least two tools from HEP or behavioral modifications for pain management.    Baseline -    Time 8    Period Weeks    Status New    Target Date 11/29/19      PT LONG TERM GOAL #5   Title Pt will attend at least 1 aquatic PT session to learn ways to exercise in the pool to reduce load bearing strain while strengthening and building endurance.    Baseline -    Time 8    Period Weeks    Status New    Target Date 11/29/19      PT LONG TERM GOAL #6   Title Pt will be ind with advanced HEP and understand how to safely progress.    Baseline -    Time 8    Period Weeks    Status New    Target Date 11/29/19      PT LONG TERM GOAL #7   Baseline -                 Plan - 11/03/19 1144    Clinical Impression Statement Pt arrived needing review of several HEP exercises.  PT reviewed seated LAQ with pause at 45 and 90 deg.  Pt has trouble feeling quad at 45 deg, so PT encouraged her to go straight to 90 deg then slowly lower to 45 deg with effort to hold tension eccentrically on quad.  Pt requested adding lower trunk rotation back to HEP in addition to LE hip IR/ER as she feels them both differently and likes them both (and says they don't take too much time.)  Pt arrived with knee pain and was without back pain before, during and after session. Knee pain signif diminished with ther ex.  PT continued to cue core and educate Pt on importance of  core with ther ex.  Pt progressed to red band for bridges with hip abd and with clamshell in SL.  Continue along POC.    Comorbidities lumbar fusion revision L4/5 and new fusion L5/S1 April 2021, Lt TKR with poor outcome/pain, HTN, diabetes, asthma, osteopenia    Rehab Potential Good    PT Frequency 2x / week    PT Duration 8 weeks    PT Treatment/Interventions Aquatic Therapy;ADLs/Self Care Home Management;Cryotherapy;Electrical Stimulation;Moist Heat;Gait training;Stair training;Functional mobility training;Therapeutic exercise;Balance training;Neuromuscular re-education;Manual techniques;Patient/family education;Passive range of motion;Dry needling;Spinal Manipulations;Joint Manipulations;Taping    PT Next Visit Plan review goals, NuStep, f/u on red band tolerance for clams, supine and seated  core and LE/UE strength, continue SLS and marching with UE support    PT Home Exercise Plan Access Code: [email or web address]    Consulted and Agree with Plan of Care Patient           Patient will benefit from skilled therapeutic intervention in order to improve the following deficits and impairments:     Visit Diagnosis: Chronic bilateral low back pain without sciatica  Muscle weakness (generalized)  Other abnormalities of gait and mobility  Chronic pain of left knee     Problem List Patient Active Problem List   Diagnosis Date Noted  . Carotid arterial disease ([place]) 01/23/2019  . RBBB (right bundle branch block with left anterior fascicular block)   . Chest pain 01/22/2019  . Weakness of both lower extremities 01/02/2019  . Frequent falls 01/02/2019  . OSA on CPAP 06/15/2017  . Diabetes ([place]) 03/02/2017  . Herpes zoster without complication 22/33/6122  . B12 nutritional deficiency 02/23/2017  . Vitamin D deficiency 12/08/2016  . Tubular adenoma of colon 04/29/2016  . Essential hypertension 09/12/2015  . Allergic rhinitis 07/17/2015  . Insomnia 07/17/2015  . Obese 04/10/2015  . S/P left  TKA 04/09/2015  . Osteopenia 12/04/2012  . 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 RESTLESS LEG SYNDROME 02/11/2009  . LEG EDEMA, BILATERAL 02/11/2009  . Hyperlipidemia 10/30/2008  . Urinary incontinence 10/30/2008  . Skin cancer 10/30/2008  . Asthma 11/21/2007  . Venous (peripheral) insufficiency 11/22/2006    Baruch Merl, PT 11/03/19 11:50 AM   [facility] [address] [place], [place], [address] Phone: [phone]   Fax:  [phone]  Name: Erin Roach MRN: [id number] Date of Birth: 1941-03-04

## 2019-11-06 NOTE — Telephone Encounter (Signed)
Dr. Elsworth Soho please advise on patient mychart message with recommendations   I have been at the point of taking 4 requip pills for several days and want to know how to decrease the hydrocodone.   We are leaving town Thursday morning for a short vacation and will return Tuesday morning, so not sure if I should try to change things then.  If it wouldn't hurt, I'd like to start decreasing the hydrocodone then. Please let me know and thanks, Erin Roach

## 2019-11-06 NOTE — Telephone Encounter (Signed)
Preferably wait until she gets back to town. Can decrease by 1 tablet of hydrocodone every 2 weeks to give her body a chance to adjust to the lower dosage

## 2019-11-08 ENCOUNTER — Other Ambulatory Visit: Payer: Self-pay

## 2019-11-08 ENCOUNTER — Ambulatory Visit: Payer: Medicare PPO | Admitting: Physical Therapy

## 2019-11-08 DIAGNOSIS — M545 Low back pain, unspecified: Secondary | ICD-10-CM

## 2019-11-08 DIAGNOSIS — M25551 Pain in right hip: Secondary | ICD-10-CM

## 2019-11-08 DIAGNOSIS — M25562 Pain in left knee: Secondary | ICD-10-CM

## 2019-11-08 DIAGNOSIS — R2689 Other abnormalities of gait and mobility: Secondary | ICD-10-CM

## 2019-11-08 DIAGNOSIS — M6281 Muscle weakness (generalized): Secondary | ICD-10-CM

## 2019-11-08 DIAGNOSIS — G8929 Other chronic pain: Secondary | ICD-10-CM

## 2019-11-08 NOTE — Therapy (Signed)
[facility] [address] [place], [place], [address] Phone: [phone]   Fax:  [phone]  Physical Therapy Treatment  Patient Details  Name: Erin Roach MRN: [id number] Date of Birth: May 17, 1940 Referring Provider (PT): Berneda Rose, [place] Georgann Housekeeper, MD)   Encounter Date: 11/08/2019   PT End of Session - 11/08/19 1307    Visit Number 7    Date for PT Re-Evaluation 11/29/19    Authorization Type [employer]    Authorization Time Period cohere approved 12 visits 5/26-8/17    Authorization - Visit Number 6    Authorization - Number of Visits 12    Progress Note Due on Visit 10    PT Start Time 1230    PT Stop Time 1309    PT Time Calculation (min) 39 min    Activity Tolerance Patient tolerated treatment well;No increased pain    Behavior During Therapy WFL for tasks assessed/performed           Past Medical History:  Diagnosis Date  . Allergic rhinitis   . Apnea   . Arthritis   . Asthma    very mild  . Back pain   . Bilateral lower extremity edema   . Carotid artery disease ([place])    a. Carotid duplex 03/2016 - duplex was stable, 60-10% RICA, 9-32% LICA  . Complication of anesthesia    post-op delirium  . Endometrial polyp   . Essential hypertension   . GERD (gastroesophageal reflux disease)   . Heart murmur    per pt  . History of adenomatous polyp of colon    04-24-2016  tubular adenoma  . History of squamous cell carcinoma excision    pilonidal area second excision 02-02-2002  . HTN (hypertension)   . Hyperlipidemia   . Leg pain   . OAB (overactive bladder)   . RA (rheumatoid arthritis) ([place])   . RBBB (right bundle branch block with left anterior fascicular block)   . RLS (restless legs syndrome)   . Shortness of breath on exertion   . SUI (stress urinary incontinence, female)   . Venous insufficiency    legs  . Vitamin D deficiency     Past Surgical History:  Procedure  Laterality Date  . APPENDECTOMY  07-02-2005   [name]   open  . CARDIAC CATHETERIZATION  01-23-2009  dr Darnell Level brodie   normal coronary arteries and lvf  . CARDIOVASCULAR STRESS TEST  03/31/2016   Low risk nuclear study w/ medium defect of mild severity in basal anterior and mid anterior location w/ no evidence ischemia or infarction/  normal LV function and wall motion,  nuclear stress ef 71%  . CATARACT EXTRACTION W/ INTRAOCULAR LENS IMPLANT Left 09/2016  . COLONOSCOPY  last one 04-24-2016  . HYSTEROSCOPY WITH D & C N/A 12/22/2016   Procedure: DILATATION AND CURETTAGE /HYSTEROSCOPY;  Surgeon: Dian Queen, MD;  Location: [facility];  Service: Gynecology;  Laterality: N/A;  . IR US GUIDANCE  09/08/2016  . POSTERIOR LUMBAR FUSION  02/ 2018    [facility] ([place], [place])  . SKIN CANCER EXCISION    . TONSILLECTOMY  child  . 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 TRANSTHORACIC ECHOCARDIOGRAM  01/21/2009   mild LVH, ef 35-57%, grade 1 diastolic dysfunction  . ULNAR NERVE TRANSPOSITION Left 12-20-2008  dr sypher   decompression and partial resection medial triceps fascia  . WIDE LOCAL EXCISION PILONIDAL AREA  02-02-2002  [name]   recurrent squamous cell carcinoma in situ    There were no vitals filed for this visit.       [facility] PT Assessment - 11/08/19 0001      Strength   Right Hip ABduction 4/5    Left Hip ABduction 4+/5                         [facility] Adult PT Treatment/Exercise - 11/08/19 0001      Lumbar Exercises: Aerobic   Nustep L3 x8 min PT discussing progress towards goals      Lumbar Exercises: Standing   Other Standing Lumbar Exercises BUE pressdown with green TB 2x15 reps       Lumbar Exercises: Seated   Other Seated Lumbar Exercises abdominal brace with hip flexion x10 reps        Lumbar Exercises: Supine   Isometric Hip Flexion 10 reps;5 seconds    Isometric Hip Flexion Limitations both LE               Balance Exercises - 11/08/19 0001      Balance Exercises: Standing   Standing Eyes Opened Narrow base of support (BOS)   trunk rotation 3x10 reps    Tandem Stance --   tandem pallof press red TB x10 reps each             PT Education - 11/08/19 1323    Education Details technique with therex    Person(s) Educated Patient    Methods Explanation;Verbal cues    Comprehension Verbalized understanding;Returned demonstration            PT Short Term Goals - 10/10/19 1227      PT SHORT TERM GOAL #1   Title Pt will be compliant and ind with initial HEP including walking 2-3 times a day for short duration as tol    Status On-going             PT Long Term Goals - 10/04/19 1247      PT LONG TERM GOAL #1   Title Pt will achieve at least 4+/5 for trunk and LE strength for improved tolerance of daily tasks.    Baseline -    Time 8    Period Weeks    Status New    Target Date 11/29/19      PT LONG TERM GOAL #2   Title Reduce FOTO to 55% to demo less limitation.    Baseline 69%    Time 8    Period Weeks    Status New    Target Date 11/29/19      PT LONG TERM GOAL #3   Title Pt will tolerate standing and walking tasks such as light housework and running errands x 20' with pain not to exceed 5/10.    Baseline -    Time 8    Period Weeks    Status New    Target Date 11/29/19      PT LONG TERM GOAL #4   Title Pt will be able to articulate at least two tools from HEP or behavioral modifications for pain management.    Baseline -    Time 8    Period Weeks    Status New    Target Date 11/29/19      PT LONG TERM  GOAL #5   Title Pt will attend at least 1 aquatic PT session to learn ways to exercise in the pool to reduce load bearing strain while strengthening and building endurance.    Baseline -    Time 8    Period Weeks    Status New     Target Date 11/29/19      PT LONG TERM GOAL #6   Title Pt will be ind with advanced HEP and understand how to safely progress.    Baseline -    Time 8    Period Weeks    Status New    Target Date 11/29/19      PT LONG TERM GOAL #7   Baseline -                 Plan - 11/08/19 1302    Clinical Impression Statement Pt's Lt hip abduction strength has improved since the evaluation. She is now walking up to 15 minutes during the week. Session continued with focus on therex to increase hip strength and balance. Pt has difficulty with single leg activity secondary to poor hip abductor strength. She was unable to complete hip hike off the step despite PT cuing. She would benefit from further exercise to address this in the future.    Comorbidities lumbar fusion revision L4/5 and new fusion L5/S1 April 2021, Lt TKR with poor outcome/pain, HTN, diabetes, asthma, osteopenia    Rehab Potential Good    PT Frequency 2x / week    PT Duration 8 weeks    PT Treatment/Interventions Aquatic Therapy;ADLs/Self Care Home Management;Cryotherapy;Electrical Stimulation;Moist Heat;Gait training;Stair training;Functional mobility training;Therapeutic exercise;Balance training;Neuromuscular re-education;Manual techniques;Patient/family education;Passive range of motion;Dry needling;Spinal Manipulations;Joint Manipulations;Taping    PT Next Visit Plan NuStep, progress glute med strength, supine and seated core and LE/UE strength, continue SLS and marching with UE support    PT Home Exercise Plan Access Code: [email or web address]    Consulted and Agree with Plan of Care Patient           Patient will benefit from skilled therapeutic intervention in order to improve the following deficits and impairments:     Visit Diagnosis: Chronic bilateral low back pain without sciatica  Muscle weakness (generalized)  Other abnormalities of gait and mobility  Chronic pain of left knee  Pain in right hip     Problem  List Patient Active Problem List   Diagnosis Date Noted  . Carotid arterial disease ([place]) 01/23/2019  . RBBB (right bundle branch block with left anterior fascicular block)   . Chest pain 01/22/2019  . Weakness of both lower extremities 01/02/2019  . Frequent falls 01/02/2019  . OSA on CPAP 06/15/2017  . Diabetes ([place]) 03/02/2017  . Herpes zoster without complication 61/60/7371  . B12 nutritional deficiency 02/23/2017  . Vitamin D deficiency 12/08/2016  . Tubular adenoma of colon 04/29/2016  . Essential hypertension 09/12/2015  . Allergic rhinitis 07/17/2015  . Insomnia 07/17/2015  . Obese 04/10/2015  . S/P left TKA 04/09/2015  . Osteopenia 12/04/2012  . 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 RESTLESS LEG SYNDROME 02/11/2009  . LEG EDEMA, BILATERAL 02/11/2009  . Hyperlipidemia 10/30/2008  . Urinary incontinence 10/30/2008  . Skin cancer 10/30/2008  . Asthma 11/21/2007  . Venous (peripheral) insufficiency 11/22/2006    1:25 PM,11/08/19 Sherol Dade PT, DPT [facility] [address], [place], [place], [address] Phone: [phone]   Fax:  [phone]  Name: TEKOA HAMOR MRN: [id number] Date of Birth: [birth year]

## 2019-11-14 ENCOUNTER — Encounter: Payer: Self-pay | Admitting: Physical Therapy

## 2019-11-14 ENCOUNTER — Other Ambulatory Visit: Payer: Self-pay

## 2019-11-14 ENCOUNTER — Ambulatory Visit: Payer: Medicare PPO | Attending: Physician Assistant | Admitting: Physical Therapy

## 2019-11-14 DIAGNOSIS — M62838 Other muscle spasm: Secondary | ICD-10-CM | POA: Insufficient documentation

## 2019-11-14 DIAGNOSIS — M545 Low back pain, unspecified: Secondary | ICD-10-CM

## 2019-11-14 DIAGNOSIS — R2689 Other abnormalities of gait and mobility: Secondary | ICD-10-CM

## 2019-11-14 DIAGNOSIS — M25562 Pain in left knee: Secondary | ICD-10-CM | POA: Insufficient documentation

## 2019-11-14 DIAGNOSIS — M25551 Pain in right hip: Secondary | ICD-10-CM | POA: Insufficient documentation

## 2019-11-14 DIAGNOSIS — R262 Difficulty in walking, not elsewhere classified: Secondary | ICD-10-CM | POA: Insufficient documentation

## 2019-11-14 DIAGNOSIS — M25552 Pain in left hip: Secondary | ICD-10-CM | POA: Diagnosis not present

## 2019-11-14 DIAGNOSIS — G8929 Other chronic pain: Secondary | ICD-10-CM | POA: Insufficient documentation

## 2019-11-14 DIAGNOSIS — M6281 Muscle weakness (generalized): Secondary | ICD-10-CM | POA: Diagnosis not present

## 2019-11-14 NOTE — Patient Instructions (Signed)
Access Code: [email or web address] URL: [email or web address] Date: 11/14/2019 Prepared by: [place]  Exercises Standing Single Leg Stance with Counter Support - 1 x daily - 7 x weekly - 1 sets - 3 reps - 10 hold Clamshell with Resistance - 1 x daily - 7 x weekly - 2 sets - 10 reps Supine Bridge with Resistance Band - 1 x daily - 7 x weekly - 2 sets - 10 reps Seated Quad Set - 1 x daily - 7 x weekly - 10 reps - 3 seconds hold Supine Hip Internal and External Rotation - 1 x daily - 7 x weekly - 1 sets - 10 reps Supine Lower Trunk Rotation - 1 x daily - 7 x weekly - 1 sets - 10 reps Standing Anti-Rotation Press with Anchored Resistance - 1 x daily - 7 x weekly - 3 sets - 10 reps  [facility] [address], [place], [place] [address] Phone # [phone] Fax [phone]

## 2019-11-14 NOTE — Therapy (Signed)
[facility] [address] [place], [place], [address] Phone: [phone]   Fax:  [phone]  Physical Therapy Treatment  Patient Details  Name: Erin Roach MRN: [id number] Date of Birth: Feb 21, 1941 Referring Provider (PT): Berneda Rose, [place] Georgann Housekeeper, MD)   Encounter Date: 11/14/2019   PT End of Session - 11/14/19 1418    Visit Number 8    Date for PT Re-Evaluation 11/29/19    Authorization Type [employer] Medicare    Authorization Time Period cohere approved 12 visits 5/26-8/17    Authorization - Visit Number 8    Authorization - Number of Visits 12    PT Start Time 1601    PT Stop Time 1444    PT Time Calculation (min) 40 min    Activity Tolerance Patient tolerated treatment well;No increased pain    Behavior During Therapy WFL for tasks assessed/performed           Past Medical History:  Diagnosis Date   Allergic rhinitis    Apnea    Arthritis    Asthma    very mild   Back pain    Bilateral lower extremity edema    Carotid artery disease ([facility])    a. Carotid duplex 03/2016 - duplex was stable, 09-32% RICA, 3-55% LICA   Complication of anesthesia    post-op delirium   Endometrial polyp    Essential hypertension    GERD (gastroesophageal reflux disease)    Heart murmur    per pt   History of adenomatous polyp of colon    04-24-2016  tubular adenoma   History of squamous cell carcinoma excision    pilonidal area second excision 02-02-2002   HTN (hypertension)    Hyperlipidemia    Leg pain    OAB (overactive bladder)    RA (rheumatoid arthritis) ([facility])    RBBB (right bundle branch block with left anterior fascicular block)    RLS (restless legs syndrome)    Shortness of breath on exertion    SUI (stress urinary incontinence, female)    Venous insufficiency    legs   Vitamin D deficiency     Past Surgical History:  Procedure Laterality Date   APPENDECTOMY   07-02-2005   [name]   open   CARDIAC CATHETERIZATION  01-23-2009  dr Darnell Level brodie   normal coronary arteries and lvf   CARDIOVASCULAR STRESS TEST  03/31/2016   Low risk nuclear study w/ medium defect of mild severity in basal anterior and mid anterior location w/ no evidence ischemia or infarction/  normal LV function and wall motion,  nuclear stress ef 71%   CATARACT EXTRACTION W/ INTRAOCULAR LENS IMPLANT Left 09/2016   COLONOSCOPY  last one 04-24-2016   HYSTEROSCOPY WITH D & C N/A 12/22/2016   Procedure: DILATATION AND CURETTAGE /HYSTEROSCOPY;  Surgeon: Dian Queen, MD;  Location: [facility];  Service: Gynecology;  Laterality: N/A;   IR US GUIDANCE  09/08/2016   POSTERIOR LUMBAR FUSION  02/ 2018    [facility] ([place], [place])   SKIN CANCER EXCISION     TONSILLECTOMY  child   TOTAL KNEE ARTHROPLASTY Left 04/09/2015   Procedure: LEFT TOTAL KNEE ARTHROPLASTY;  Surgeon: Paralee Cancel, MD;  [facility];  Service: Orthopedics;  Laterality: Left;   TRANSTHORACIC ECHOCARDIOGRAM  01/21/2009   mild LVH, ef 73-22%, grade 1 diastolic dysfunction   ULNAR NERVE TRANSPOSITION Left 12-20-2008  dr sypher   decompression and partial resection medial  triceps fascia   WIDE LOCAL EXCISION PILONIDAL AREA  02-02-2002  [name]   recurrent squamous cell carcinoma in situ    There were no vitals filed for this visit.   Subjective Assessment - 11/14/19 1407    Subjective Pt states that things are going well. She is doing well with her back but the knee is still bothering her.    Pertinent History "[place]";  left TKR;  lumbar fusion; restless legs, HTN; diabetes;  mild neuropathy;  osteopenia    How long can you sit comfortably? 45 min    How long can you stand comfortably? 5 min    How long can you walk comfortably? household mobility, 5 min    Diagnostic tests none    Patient Stated Goals I would like to be able to walk 30 minutes, more balanced    Currently  in Pain? No/denies    Pain Onset More than a month ago                             [facility] Adult PT Treatment/Exercise - 11/14/19 0001      Therapeutic Activites    Therapeutic Activities Lifting    Lifting lifting technique #5 dumbbell touch to 6" box x5 reps       Lumbar Exercises: Seated   Other Seated Lumbar Exercises abdominal brace with yellow TB pallof press 2x10 reps each direction      Lumbar Exercises: Supine   Other Supine Lumbar Exercises supine bent knee 90/90 hold 3x10 sec; reactive holds 3x10 reps       Knee/Hip Exercises: Aerobic   Nustep seat 8 L3 x 6' PT present to discuss POC      Knee/Hip Exercises: Sidelying   Hip ABduction Strengthening;Both;1 set;10 reps                  PT Education - 11/14/19 1457    Education Details progress towards goals    Person(s) Educated Patient    Methods Explanation    Comprehension Verbalized understanding            PT Short Term Goals - 11/14/19 1452      PT SHORT TERM GOAL #1   Title Pt will be compliant and ind with initial HEP including walking 2-3 times a day for short duration as tol    Time 4    Period Weeks    Status Achieved    Target Date 11/01/19      PT SHORT TERM GOAL #2   Title Pt will demo gait symmetry with improved step length and equal WB between bil LEs.    Baseline -    Time 4    Period Weeks    Status Achieved    Target Date 11/01/19      PT SHORT TERM GOAL #3   Title Pt will be able to perform light household standing tasks x 5'    Baseline -    Time 4    Period Weeks    Status New    Target Date 11/01/19      PT SHORT TERM GOAL #4   Title Pt will report improved pain with daily household tasks by at least 20%    Baseline -    Time 4    Period Weeks    Status New    Target Date 11/01/19  PT Long Term Goals - 11/14/19 1452      PT LONG TERM GOAL #1   Title Pt will achieve at least 4+/5 for trunk and LE strength for improved tolerance  of daily tasks.    Baseline -    Time 8    Period Weeks    Status New      PT LONG TERM GOAL #2   Title Reduce FOTO to 55% to demo less limitation.    Baseline 47% limited    Time 8    Period Weeks    Status Achieved      PT LONG TERM GOAL #3   Title Pt will tolerate standing and walking tasks such as light housework and running errands x 20' with pain not to exceed 5/10.    Baseline -    Time 8    Period Weeks    Status New      PT LONG TERM GOAL #4   Title Pt will be able to articulate at least two tools from HEP or behavioral modifications for pain management.    Baseline -    Time 8    Period Weeks    Status New      PT LONG TERM GOAL #5   Title Pt will attend at least 1 aquatic PT session to learn ways to exercise in the pool to reduce load bearing strain while strengthening and building endurance.    Baseline -    Time 8    Period Weeks    Status New      PT LONG TERM GOAL #6   Title Pt will be ind with advanced HEP and understand how to safely progress.    Baseline -    Time 8    Period Weeks    Status New      PT LONG TERM GOAL #7   Baseline -                 Plan - 11/14/19 1445    Clinical Impression Statement Pt is making progress towards her goals. Her FOTO score improved to 47% limitation, down from 69% at the evaluation. Pt feels she is limited with walking and standing endurance currently. She finds it difficult to determine how much of her activity is limited by the Lt knee. Her HEP understanding has improved significantly. Pt was able to complete all of todays exercises without increase in low back pain but she did have fatigue with lifting technique education.    Comorbidities lumbar fusion revision L4/5 and new fusion L5/S1 April 2021, Lt TKR with poor outcome/pain, HTN, diabetes, asthma, osteopenia    Rehab Potential Good    PT Frequency 2x / week    PT Duration 8 weeks    PT Treatment/Interventions Aquatic Therapy;ADLs/Self Care Home  Management;Cryotherapy;Electrical Stimulation;Moist Heat;Gait training;Stair training;Functional mobility training;Therapeutic exercise;Balance training;Neuromuscular re-education;Manual techniques;Patient/family education;Passive range of motion;Dry needling;Spinal Manipulations;Joint Manipulations;Taping    PT Next Visit Plan NuStep, progress glute med strength, seated/standing core and LE/UE strength, continue SLS and marching with UE support    PT Home Exercise Plan Access Code: [email or web address]    Consulted and Agree with Plan of Care Patient           Patient will benefit from skilled therapeutic intervention in order to improve the following deficits and impairments:     Visit Diagnosis: Chronic bilateral low back pain without sciatica  Muscle weakness (generalized)  Other abnormalities of gait and mobility  Chronic pain  of left knee     Problem List Patient Active Problem List   Diagnosis Date Noted   Carotid arterial disease ([place]) 01/23/2019   RBBB (right bundle branch block with left anterior fascicular block)    Chest pain 01/22/2019   Weakness of both lower extremities 01/02/2019   Frequent falls 01/02/2019   OSA on CPAP 06/15/2017   Diabetes ([place]) 03/02/2017   Herpes zoster without complication 35/78/9784   B12 nutritional deficiency 02/23/2017   Vitamin D deficiency 12/08/2016   Tubular adenoma of colon 04/29/2016   Essential hypertension 09/12/2015   Allergic rhinitis 07/17/2015   Insomnia 07/17/2015   Obese 04/10/2015   S/P left TKA 04/09/2015   Osteopenia 12/04/2012   FAMILIAL TREMOR 11/18/2009   RESTLESS LEG SYNDROME 02/11/2009   LEG EDEMA, BILATERAL 02/11/2009   Hyperlipidemia 10/30/2008   Urinary incontinence 10/30/2008   Skin cancer 10/30/2008   Asthma 11/21/2007   Venous (peripheral) insufficiency 11/22/2006    2:58 PM,11/14/19 Sherol Dade PT, DPT [place] [facility] [address], [place], [place], [address] Phone: [phone]   Fax:  [phone]  Name: NAVY ROTHSCHILD MRN: [id number] Date of Birth: 05-30-1940

## 2019-11-16 ENCOUNTER — Ambulatory Visit: Payer: Medicare PPO | Admitting: Physical Therapy

## 2019-11-16 NOTE — Telephone Encounter (Signed)
Verified that patient was to start with 0.5 mg of rOPINIRole (REQUIP) and to increase by 0.5 mg every 3 nights for a maximum dose of 2 mg which is 4 tablets.  Sent message to patient that she was correct with the instructions. Dr. Elsworth Soho, please advise on refill of Requip.  Thank you.

## 2019-11-17 ENCOUNTER — Encounter: Payer: Medicare PPO | Admitting: Physical Therapy

## 2019-11-20 DIAGNOSIS — M545 Low back pain: Secondary | ICD-10-CM | POA: Diagnosis not present

## 2019-11-20 MED ORDER — ROPINIROLE HCL 2 MG PO TABS: 2.0000 mg | ORAL_TABLET | Freq: Every day | ORAL | 2 refills | Status: DC

## 2019-11-20 NOTE — Telephone Encounter (Signed)
Okay to send prescription for 2 mg of ropinirole , 2 hours prior to bedtime #30 x 2 refills

## 2019-11-21 ENCOUNTER — Ambulatory Visit: Payer: Medicare PPO | Admitting: Physical Therapy

## 2019-11-21 ENCOUNTER — Encounter: Payer: Self-pay | Admitting: Physical Therapy

## 2019-11-21 ENCOUNTER — Other Ambulatory Visit: Payer: Self-pay

## 2019-11-21 DIAGNOSIS — R262 Difficulty in walking, not elsewhere classified: Secondary | ICD-10-CM | POA: Diagnosis not present

## 2019-11-21 DIAGNOSIS — M25562 Pain in left knee: Secondary | ICD-10-CM | POA: Diagnosis not present

## 2019-11-21 DIAGNOSIS — M6281 Muscle weakness (generalized): Secondary | ICD-10-CM

## 2019-11-21 DIAGNOSIS — M545 Low back pain, unspecified: Secondary | ICD-10-CM

## 2019-11-21 DIAGNOSIS — R2689 Other abnormalities of gait and mobility: Secondary | ICD-10-CM | POA: Diagnosis not present

## 2019-11-21 DIAGNOSIS — G8929 Other chronic pain: Secondary | ICD-10-CM | POA: Diagnosis not present

## 2019-11-21 DIAGNOSIS — M25551 Pain in right hip: Secondary | ICD-10-CM | POA: Diagnosis not present

## 2019-11-21 DIAGNOSIS — M25552 Pain in left hip: Secondary | ICD-10-CM | POA: Diagnosis not present

## 2019-11-21 DIAGNOSIS — M62838 Other muscle spasm: Secondary | ICD-10-CM | POA: Diagnosis not present

## 2019-11-21 NOTE — Therapy (Signed)
[facility] [address] [place], [place], [address] Phone: [phone]   Fax:  [phone]  Physical Therapy Treatment  Patient Details  Name: Erin Roach MRN: [id number] Date of Birth: 09-14-1940 Referring Provider (PT): Berneda Rose, [place] Georgann Housekeeper, MD)   Encounter Date: 11/21/2019   PT End of Session - 11/21/19 1405    Visit Number 9    Date for PT Re-Evaluation 11/29/19    Authorization Type [employer]    Authorization Time Period cohere approved 12 visits 5/26-8/17    Authorization - Visit Number 9    Authorization - Number of Visits 12    Progress Note Due on Visit 10    PT Start Time 1400    PT Stop Time 6761    PT Time Calculation (min) 45 min    Activity Tolerance Patient tolerated treatment well;No increased pain    Behavior During Therapy WFL for tasks assessed/performed           Past Medical History:  Diagnosis Date  . Allergic rhinitis   . Apnea   . Arthritis   . Asthma    very mild  . Back pain   . Bilateral lower extremity edema   . Carotid artery disease ([place])    a. Carotid duplex 03/2016 - duplex was stable, 95-09% RICA, 3-26% LICA  . Complication of anesthesia    post-op delirium  . Endometrial polyp   . Essential hypertension   . GERD (gastroesophageal reflux disease)   . Heart murmur    per pt  . History of adenomatous polyp of colon    04-24-2016  tubular adenoma  . History of squamous cell carcinoma excision    pilonidal area second excision 02-02-2002  . HTN (hypertension)   . Hyperlipidemia   . Leg pain   . OAB (overactive bladder)   . RA (rheumatoid arthritis) ([place])   . RBBB (right bundle branch block with left anterior fascicular block)   . RLS (restless legs syndrome)   . Shortness of breath on exertion   . SUI (stress urinary incontinence, female)   . Venous insufficiency    legs  . Vitamin D deficiency     Past Surgical History:  Procedure  Laterality Date  . APPENDECTOMY  07-02-2005   [name]   open  . CARDIAC CATHETERIZATION  01-23-2009  dr Darnell Level brodie   normal coronary arteries and lvf  . CARDIOVASCULAR STRESS TEST  03/31/2016   Low risk nuclear study w/ medium defect of mild severity in basal anterior and mid anterior location w/ no evidence ischemia or infarction/  normal LV function and wall motion,  nuclear stress ef 71%  . CATARACT EXTRACTION W/ INTRAOCULAR LENS IMPLANT Left 09/2016  . COLONOSCOPY  last one 04-24-2016  . HYSTEROSCOPY WITH D & C N/A 12/22/2016   Procedure: DILATATION AND CURETTAGE /HYSTEROSCOPY;  Surgeon: Dian Queen, MD;  Location: [place];  Service: Gynecology;  Laterality: N/A;  . IR US GUIDANCE  09/08/2016  . POSTERIOR LUMBAR FUSION  02/ 2018    [facility] ([place])  . SKIN CANCER EXCISION    . TONSILLECTOMY  child  . 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 TRANSTHORACIC ECHOCARDIOGRAM  01/21/2009   mild LVH, ef 71-24%, grade 1 diastolic dysfunction  . ULNAR NERVE TRANSPOSITION Left 12-20-2008  dr sypher   decompression and partial resection medial triceps fascia  . WIDE LOCAL EXCISION PILONIDAL AREA  02-02-2002  [name]   recurrent squamous cell carcinoma in situ    There were no vitals filed for this visit.   Subjective Assessment - 11/21/19 1401    Subjective I saw the MD and he said everything looks good.  I notice I do feel better on the days I exercise.  I walk around the house x 13' and am limited by my knee > back.    Pertinent History "[place]";  left TKR;  lumbar fusion; restless legs, HTN; diabetes;  mild neuropathy;  osteopenia    Limitations Sitting;Lifting;Walking;House hold activities    How long can you sit comfortably? 45 min    How long can you stand comfortably? 10 min if distracted and not thinking  about it    How long can you walk comfortably? 34'    Diagnostic tests none    Patient Stated Goals I would like to be able to walk 30 minutes, more balanced    Currently in Pain? Yes    Pain Score 4     Pain Location Back    Pain Orientation Lower    Pain Descriptors / Indicators Dull;Aching    Pain Type Surgical pain    Pain Onset More than a month ago                             [facility] Adult PT Treatment/Exercise - 11/21/19 0001      Self-Care   Self-Care Other Self-Care Comments    Other Self-Care Comments  try several short walks per day to cue healthy movement more often      Exercises   Exercises Lumbar;Knee/Hip      Lumbar Exercises: Aerobic   Nustep L3 x 8', PT present to discuss activity level      Lumbar Exercises: Standing   Other Standing Lumbar Exercises rockerboard x 2' in good posture and use TrA, single finger UE support for balance      Lumbar Exercises: Seated   Long Arc Quad on Chair Strengthening;Both;2 sets;10 reps;Weights    LAQ on Chair Weights (lbs) 2      Knee/Hip Exercises: Stretches   Active Hamstring Stretch Both;2 reps;30 seconds    Piriformis Stretch Both;2 reps;30 seconds    Other Knee/Hip Stretches adductor with strap 1x30 sec      Knee/Hip Exercises: Standing   Lateral Step Up Both;10 reps;Step Height: 4";Hand Hold: 1    SLS with LE march 2lb ankle weight hover over 1st step 2x10 sec, single fingertip support    Other Standing Knee Exercises march toe taps 1st step with 2lb ankle weights, PT cued TrA and glut med in stance leg, bil UE fingertip support      Shoulder Exercises: Seated   Other Seated Exercises yellow palloff press 2x10 each way                    PT Short Term Goals - 11/21/19 1420      PT SHORT TERM GOAL #1   Title Pt will be compliant and ind with initial HEP including walking 2-3 times a day for short duration as tol    Status Achieved      PT SHORT TERM GOAL #2   Title Pt will demo  gait symmetry with improved step length and equal WB between bil LEs.  Status Achieved      PT SHORT TERM GOAL #3   Title Pt will be able to perform light household standing tasks x 5'    Status Achieved      PT SHORT TERM GOAL #4   Title Pt will report improved pain with daily household tasks by at least 20%    Status Achieved             PT Long Term Goals - 11/21/19 1432      PT LONG TERM GOAL #1   Title Pt will achieve at least 4+/5 for trunk and LE strength for improved tolerance of daily tasks.    Status On-going      PT LONG TERM GOAL #2   Title Reduce FOTO to 55% to demo less limitation.    Baseline 47% limited    Status Achieved      PT LONG TERM GOAL #3   Title Pt will tolerate standing and walking tasks such as light housework and running errands x 20' with pain not to exceed 5/10.    Baseline met for light standing tasks but not for tasks requiring bending/lifting    Status On-going      PT LONG TERM GOAL #4   Title Pt will be able to articulate at least two tools from HEP or behavioral modifications for pain management.    Baseline using core a lot more, realizing less pain on days I exercise    Status Achieved      PT LONG TERM GOAL #5   Title Pt will attend at least 1 aquatic PT session to learn ways to exercise in the pool to reduce load bearing strain while strengthening and building endurance.    Baseline has appt this week    Status On-going      PT LONG TERM GOAL #6   Title Pt will be ind with advanced HEP and understand how to safely progress.    Status On-going      PT LONG TERM GOAL #7   Title -                 Plan - 11/21/19 1440    Clinical Impression Statement Pt has met all STGs and is making progress towards LTGs.  PT discussed the idea of sprinking purposeful healthy movement throughout her day including several short walks around house with meals to increase her walking endurance and tolerance.  Pt  reports she is having less  pain with light household tasks but is still limited with bending/lifting tasks.  She will benefit from ongoing instruction in body mechanics to improve this.  Pt will attend first aquatic PT session later this week.  Pain limitations vary between her Lt knee and lumbar spine.  She reports her Lt knee pain increases with exercise but her back improves.  Re-evaluation next week.    Comorbidities lumbar fusion revision L4/5 and new fusion L5/S1 April 2021, Lt TKR with poor outcome/pain, HTN, diabetes, asthma, osteopenia    Rehab Potential Good    PT Frequency 2x / week    PT Duration 8 weeks    PT Treatment/Interventions Aquatic Therapy;ADLs/Self Care Home Management;Cryotherapy;Electrical Stimulation;Moist Heat;Gait training;Stair training;Functional mobility training;Therapeutic exercise;Balance training;Neuromuscular re-education;Manual techniques;Patient/family education;Passive range of motion;Dry needling;Spinal Manipulations;Joint Manipulations;Taping    PT Next Visit Plan work on core and Economist for bend/lift for LTG, aquatic PT and re-eval in next several visits, 10th visit PN next time    PT Home Exercise  Plan Access Code: [email or web address]    Consulted and Agree with Plan of Care Patient           Patient will benefit from skilled therapeutic intervention in order to improve the following deficits and impairments:     Visit Diagnosis: Chronic bilateral low back pain without sciatica  Muscle weakness (generalized)  Chronic pain of left knee  Other abnormalities of gait and mobility     Problem List Patient Active Problem List   Diagnosis Date Noted  . Carotid arterial disease ([place]) 01/23/2019  . RBBB (right bundle branch block with left anterior fascicular block)   . Chest pain 01/22/2019  . Weakness of both lower extremities 01/02/2019  . Frequent falls 01/02/2019  . OSA on CPAP 06/15/2017  . Diabetes ([place]) 03/02/2017  . Herpes zoster without complication 07/20/8116  .  B12 nutritional deficiency 02/23/2017  . Vitamin D deficiency 12/08/2016  . Tubular adenoma of colon 04/29/2016  . Essential hypertension 09/12/2015  . Allergic rhinitis 07/17/2015  . Insomnia 07/17/2015  . Obese 04/10/2015  . S/P left TKA 04/09/2015  . Osteopenia 12/04/2012  . 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 RESTLESS LEG SYNDROME 02/11/2009  . LEG EDEMA, BILATERAL 02/11/2009  . Hyperlipidemia 10/30/2008  . Urinary incontinence 10/30/2008  . Skin cancer 10/30/2008  . Asthma 11/21/2007  . Venous (peripheral) insufficiency 11/22/2006    Baruch Merl, PT 11/21/19 2:48 PM   [facility] [address] [place], [place], [address] Phone: [phone]   Fax:  [phone]  Name: Erin Roach MRN: [id number] Date of Birth: 05-02-1941

## 2019-11-24 ENCOUNTER — Ambulatory Visit: Payer: Medicare PPO | Admitting: Physical Therapy

## 2019-11-24 ENCOUNTER — Encounter: Payer: Self-pay | Admitting: Physical Therapy

## 2019-11-24 ENCOUNTER — Other Ambulatory Visit: Payer: Self-pay

## 2019-11-24 DIAGNOSIS — M545 Low back pain, unspecified: Secondary | ICD-10-CM

## 2019-11-24 DIAGNOSIS — M25562 Pain in left knee: Secondary | ICD-10-CM | POA: Diagnosis not present

## 2019-11-24 DIAGNOSIS — M25552 Pain in left hip: Secondary | ICD-10-CM | POA: Diagnosis not present

## 2019-11-24 DIAGNOSIS — M6281 Muscle weakness (generalized): Secondary | ICD-10-CM

## 2019-11-24 DIAGNOSIS — M62838 Other muscle spasm: Secondary | ICD-10-CM | POA: Diagnosis not present

## 2019-11-24 DIAGNOSIS — R262 Difficulty in walking, not elsewhere classified: Secondary | ICD-10-CM | POA: Diagnosis not present

## 2019-11-24 DIAGNOSIS — R2689 Other abnormalities of gait and mobility: Secondary | ICD-10-CM | POA: Diagnosis not present

## 2019-11-24 DIAGNOSIS — G8929 Other chronic pain: Secondary | ICD-10-CM

## 2019-11-24 DIAGNOSIS — M25551 Pain in right hip: Secondary | ICD-10-CM

## 2019-11-24 NOTE — Therapy (Addendum)
[facility] [address], [place], [place], [address] Phone: [phone]   Fax:  [phone]  Physical Therapy Treatment  Patient Details  Name: Erin Roach MRN: [id number] Date of Birth: Jul 09, 1940 Referring Provider (PT): Berneda Rose, [place] Georgann Housekeeper, MD)  Progress Note Reporting Period 10/04/19 to 11/24/19  See note below for Objective Data and Assessment of Progress/Goals.      Encounter Date: 11/24/2019   PT End of Session - 11/24/19 1630    Visit Number 10    Date for PT Re-Evaluation 11/29/19    Authorization Type [employer]    Authorization Time Period cohere approved 12 visits 5/26-8/17    Authorization - Visit Number 10    Authorization - Number of Visits 12    Progress Note Due on Visit 20    PT Start Time 1250    PT Stop Time 1330    PT Time Calculation (min) 40 min    Activity Tolerance Patient tolerated treatment well    Behavior During Therapy WFL for tasks assessed/performed           Past Medical History:  Diagnosis Date  . Allergic rhinitis   . Apnea   . Arthritis   . Asthma    very mild  . Back pain   . Bilateral lower extremity edema   . Carotid artery disease ([place])    a. Carotid duplex 03/2016 - duplex was stable, 22-02% RICA, 5-42% LICA  . Complication of anesthesia    post-op delirium  . Endometrial polyp   . Essential hypertension   . GERD (gastroesophageal reflux disease)   . Heart murmur    per pt  . History of adenomatous polyp of colon    04-24-2016  tubular adenoma  . History of squamous cell carcinoma excision    pilonidal area second excision 02-02-2002  . HTN (hypertension)   . Hyperlipidemia   . Leg pain   . OAB (overactive bladder)   . RA (rheumatoid arthritis) ([place])   . RBBB (right bundle branch block with left anterior fascicular block)   . RLS (restless legs syndrome)   . Shortness of breath on exertion   . SUI (stress urinary incontinence,  female)   . Venous insufficiency    legs  . Vitamin D deficiency     Past Surgical History:  Procedure Laterality Date  . APPENDECTOMY  07-02-2005   [name]   open  . CARDIAC CATHETERIZATION  01-23-2009  dr Darnell Level brodie   normal coronary arteries and lvf  . CARDIOVASCULAR STRESS TEST  03/31/2016   Low risk nuclear study w/ medium defect of mild severity in basal anterior and mid anterior location w/ no evidence ischemia or infarction/  normal LV function and wall motion,  nuclear stress ef 71%  . CATARACT EXTRACTION W/ INTRAOCULAR LENS IMPLANT Left 09/2016  . COLONOSCOPY  last one 04-24-2016  . HYSTEROSCOPY WITH D & C N/A 12/22/2016   Procedure: DILATATION AND CURETTAGE /HYSTEROSCOPY;  Surgeon: Dian Queen, MD;  Location: [place];  Service: Gynecology;  Laterality: N/A;  . IR US GUIDANCE  09/08/2016  . POSTERIOR LUMBAR FUSION  02/ 2018    [facility] ([place], [place])  . SKIN CANCER EXCISION    . TONSILLECTOMY  child  . 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 TRANSTHORACIC ECHOCARDIOGRAM  01/21/2009   mild LVH, ef 01-00%, grade 1 diastolic dysfunction  . ULNAR NERVE TRANSPOSITION Left 12-20-2008  dr sypher   decompression and partial resection medial triceps fascia  . WIDE LOCAL EXCISION PILONIDAL AREA  02-02-2002  [name]   recurrent squamous cell carcinoma in situ    There were no vitals filed for this visit.   Subjective Assessment - 11/24/19 1628    Subjective My back feels pretty good today but my knee is more bothersome. I am seeing Dr Maureen Ralphs to check on this.    Pertinent History "[place]";  left TKR;  lumbar fusion; restless legs, HTN; diabetes;  mild neuropathy;  osteopenia    Limitations Sitting;Lifting;Walking;House hold activities    Currently in Pain? Yes   Lt knee 3/10, low back 1/10   Pain  Descriptors / Indicators Discomfort;Aching;Dull    Aggravating Factors  Knee is fairly constant    Pain Relieving Factors exercises, ice/heat              [facility] PT Assessment - 11/24/19 0001      Observation/Other Assessments   Focus on Therapeutic Outcomes (FOTO)  69% limited, goal 55%      Strength   Right Hip ABduction 4/5    Left Hip ABduction 4+/5           Treatment: Aquatic Session Water temp 87.6 degrees F Pt entered water via 3 steps with heavy use of hand rails  Standing in mid chest depth: Pt was educated in water principles and how to use & progress them. Pt verbally understood. Water walking 2 full lengths of the pool, pt slow and cautious. All 4 directions. High knee marching while pressing on blue noodle for posture. 1/4 squat against the wall with blue noodle abdominal press: 10x 3 sec hold Hip 4 ways 10x with UE support Gentle trunk rotation 10x with noodle Decompression float for spine and knee traction 1 min in between ex.                        PT Short Term Goals - 11/21/19 1420      PT SHORT TERM GOAL #1   Title Pt will be compliant and ind with initial HEP including walking 2-3 times a day for short duration as tol    Status Achieved      PT SHORT TERM GOAL #2   Title Pt will demo gait symmetry with improved step length and equal WB between bil LEs.    Status Achieved      PT SHORT TERM GOAL #3   Title Pt will be able to perform light household standing tasks x 5'    Status Achieved      PT SHORT TERM GOAL #4   Title Pt will report improved pain with daily household tasks by at least 20%    Status Achieved             PT Long Term Goals - 11/24/19 1636      PT LONG TERM GOAL #1   Title Pt will achieve at least 4+/5 for trunk and LE strength for improved tolerance of daily tasks.    Time 8    Period Weeks    Status On-going      PT LONG TERM GOAL #2   Title Reduce FOTO to 55% to demo less limitation.    Time  8    Period Weeks    Status Achieved  PT LONG TERM GOAL #3   Title Pt will tolerate standing and walking tasks such as light housework and running errands x 20' with pain not to exceed 5/10.    Baseline met for light standing tasks but not for tasks requiring bending/lifting    Time 8    Period Weeks    Status On-going      PT LONG TERM GOAL #4   Title Pt will be able to articulate at least two tools from HEP or behavioral modifications for pain management.    Baseline using core a lot more, realizing less pain on days I exercise    Period Weeks    Status Achieved      PT LONG TERM GOAL #5   Title Pt will attend at least 1 aquatic PT session to learn ways to exercise in the pool to reduce load bearing strain while strengthening and building endurance.    Time 8    Period Weeks    Status Achieved                 Plan - 11/24/19 1632    Clinical Impression Statement Pt arrives today for her first aquatic session. Pt was educated in water principles, how we would use them and how she could progress herself moving forward. Pt noted very low to no pain exercising in the water and especially liked the decompressive postures in between the exercises. Pt did demonstrate some balance challenges with some exercises but this showed improvement with practice.    Personal Factors and Comorbidities Age;Past/Current Experience;Comorbidity 1;Comorbidity 2;Comorbidity 3+    Comorbidities lumbar fusion revision L4/5 and new fusion L5/S1 April 2021, Lt TKR with poor outcome/pain, HTN, diabetes, asthma, osteopenia    Examination-Activity Limitations Locomotion Level;Bend;Squat;Stairs;Lift    Examination-Participation Restrictions Cleaning;Community Activity;Shop;Laundry    Stability/Clinical Decision Making Stable/Uncomplicated    Rehab Potential Good    PT Frequency 2x / week    PT Duration 8 weeks    PT Treatment/Interventions Aquatic Therapy;ADLs/Self Care Home  Management;Cryotherapy;Electrical Stimulation;Moist Heat;Gait training;Stair training;Functional mobility training;Therapeutic exercise;Balance training;Neuromuscular re-education;Manual techniques;Patient/family education;Passive range of motion;Dry needling;Spinal Manipulations;Joint Manipulations;Taping    PT Next Visit Plan send 10th visit progress note    PT Home Exercise Plan Access Code: [email or web address]    Consulted and Agree with Plan of Care Patient           Patient will benefit from skilled therapeutic intervention in order to improve the following deficits and impairments:  Abnormal gait, Decreased range of motion, Difficulty walking, Increased fascial restricitons, Decreased strength, Postural dysfunction, Improper body mechanics, Impaired flexibility, Hypomobility, Decreased scar mobility, Pain, Decreased activity tolerance, Decreased endurance, Decreased mobility  Visit Diagnosis: Chronic bilateral low back pain without sciatica  Muscle weakness (generalized)  Chronic pain of left knee  Other abnormalities of gait and mobility  Pain in right hip  Difficulty in walking, not elsewhere classified     Problem List Patient Active Problem List   Diagnosis Date Noted  . Carotid arterial disease ([place]) 01/23/2019  . RBBB (right bundle branch block with left anterior fascicular block)   . Chest pain 01/22/2019  . Weakness of both lower extremities 01/02/2019  . Frequent falls 01/02/2019  . OSA on CPAP 06/15/2017  . Diabetes ([place]) 03/02/2017  . Herpes zoster without complication 85/06/7739  . B12 nutritional deficiency 02/23/2017  . Vitamin D deficiency 12/08/2016  . Tubular adenoma of colon 04/29/2016  . Essential hypertension 09/12/2015  . Allergic rhinitis 07/17/2015  . Insomnia  07/17/2015  . Obese 04/10/2015  . S/P left TKA 04/09/2015  . Osteopenia 12/04/2012  . 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 RESTLESS LEG SYNDROME 02/11/2009  . LEG EDEMA, BILATERAL 02/11/2009  .  Hyperlipidemia 10/30/2008  . Urinary incontinence 10/30/2008  . Skin cancer 10/30/2008  . Asthma 11/21/2007  . Venous (peripheral) insufficiency 11/22/2006    Everest Hacking, PTA 11/24/2019, 4:46 PM  [facility] [address], [place], [place], [address] Phone: [phone]   Fax:  [phone]  Name: Erin Roach MRN: [id number] Date of Birth: 1940/11/25   *addendum to provide 10th visit progress note dates. I agree with the above information and measurements completed by Myrene Galas PTA.  5:19 PM,11/30/19 Sherol Dade PT, [place] [facility] [place]

## 2019-11-28 ENCOUNTER — Encounter: Payer: Self-pay | Admitting: Physical Therapy

## 2019-11-28 ENCOUNTER — Other Ambulatory Visit: Payer: Self-pay

## 2019-11-28 ENCOUNTER — Ambulatory Visit: Payer: Medicare PPO | Admitting: Physical Therapy

## 2019-11-28 DIAGNOSIS — M25551 Pain in right hip: Secondary | ICD-10-CM | POA: Diagnosis not present

## 2019-11-28 DIAGNOSIS — R2689 Other abnormalities of gait and mobility: Secondary | ICD-10-CM | POA: Diagnosis not present

## 2019-11-28 DIAGNOSIS — M545 Low back pain, unspecified: Secondary | ICD-10-CM

## 2019-11-28 DIAGNOSIS — G8929 Other chronic pain: Secondary | ICD-10-CM | POA: Diagnosis not present

## 2019-11-28 DIAGNOSIS — M25562 Pain in left knee: Secondary | ICD-10-CM | POA: Diagnosis not present

## 2019-11-28 DIAGNOSIS — R262 Difficulty in walking, not elsewhere classified: Secondary | ICD-10-CM | POA: Diagnosis not present

## 2019-11-28 DIAGNOSIS — M6281 Muscle weakness (generalized): Secondary | ICD-10-CM | POA: Diagnosis not present

## 2019-11-28 DIAGNOSIS — M62838 Other muscle spasm: Secondary | ICD-10-CM | POA: Diagnosis not present

## 2019-11-28 DIAGNOSIS — M25552 Pain in left hip: Secondary | ICD-10-CM | POA: Diagnosis not present

## 2019-11-28 NOTE — Therapy (Signed)
[facility] [address], [place], [place], [address] Phone: [phone]   Fax:  [phone]  Physical Therapy Treatment  Patient Details  Name: Erin Roach MRN: [id number] Date of Birth: February 17, 1941 Referring Provider (PT): Berneda Rose, [place]   Encounter Date: 11/28/2019   PT End of Session - 11/28/19 1407    Visit Number 11    Date for PT Re-Evaluation 01/23/20    Authorization Type [employer]    Authorization Time Period cohere submitted    PT Start Time 1400    PT Stop Time 1443    PT Time Calculation (min) 43 min    Activity Tolerance Patient tolerated treatment well    Behavior During Therapy [facility] for tasks assessed/performed           Past Medical History:  Diagnosis Date  . Allergic rhinitis   . Apnea   . Arthritis   . Asthma    very mild  . Back pain   . Bilateral lower extremity edema   . Carotid artery disease ([place])    a. Carotid duplex 03/2016 - duplex was stable, 61-60% RICA, 7-37% LICA  . Complication of anesthesia    post-op delirium  . Endometrial polyp   . Essential hypertension   . GERD (gastroesophageal reflux disease)   . Heart murmur    per pt  . History of adenomatous polyp of colon    04-24-2016  tubular adenoma  . History of squamous cell carcinoma excision    pilonidal area second excision 02-02-2002  . HTN (hypertension)   . Hyperlipidemia   . Leg pain   . OAB (overactive bladder)   . RA (rheumatoid arthritis) ([place])   . RBBB (right bundle branch block with left anterior fascicular block)   . RLS (restless legs syndrome)   . Shortness of breath on exertion   . SUI (stress urinary incontinence, female)   . Venous insufficiency    legs  . Vitamin D deficiency     Past Surgical History:  Procedure Laterality Date  . APPENDECTOMY  07-02-2005   [name]   open  . CARDIAC CATHETERIZATION  01-23-2009  dr Darnell Level brodie   normal coronary arteries and lvf  .  CARDIOVASCULAR STRESS TEST  03/31/2016   Low risk nuclear study w/ medium defect of mild severity in basal anterior and mid anterior location w/ no evidence ischemia or infarction/  normal LV function and wall motion,  nuclear stress ef 71%  . CATARACT EXTRACTION W/ INTRAOCULAR LENS IMPLANT Left 09/2016  . COLONOSCOPY  last one 04-24-2016  . HYSTEROSCOPY WITH D & C N/A 12/22/2016   Procedure: DILATATION AND CURETTAGE /HYSTEROSCOPY;  Surgeon: Dian Queen, MD;  Location: [place];  Service: Gynecology;  Laterality: N/A;  . IR US GUIDANCE  09/08/2016  . POSTERIOR LUMBAR FUSION  02/ 2018    [facility] ([place])  . SKIN CANCER EXCISION    . TONSILLECTOMY  child  . 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 TRANSTHORACIC ECHOCARDIOGRAM  01/21/2009   mild LVH, ef 10-62%, grade 1 diastolic dysfunction  . ULNAR NERVE TRANSPOSITION Left 12-20-2008  dr sypher   decompression and partial resection medial triceps fascia  . WIDE LOCAL EXCISION PILONIDAL AREA  02-02-2002  [name]   recurrent squamous cell carcinoma in situ  There were no vitals filed for this visit.   Subjective Assessment - 11/28/19 1403    Subjective Pt states she feels pretty good today. Back pain is in the middle and knee is a little worse Lt knee.  I always feel the knee.    Pertinent History "Britney";  left TKR;  lumbar fusion; restless legs, HTN; diabetes;  mild neuropathy;  osteopenia    Limitations Sitting;Lifting;Walking;House hold activities    Currently in Pain? Yes    Pain Score 3     Pain Location Knee    Pain Orientation Left    Pain Descriptors / Indicators Aching    Pain Type Chronic pain    Pain Frequency Intermittent    Aggravating Factors  walking    Multiple Pain Sites Yes    Pain Score 2    Pain Location Back    Pain Orientation Lower;Mid     Pain Descriptors / Indicators Aching    Pain Type Chronic pain              [facility] PT Assessment - 11/28/19 0001      Assessment   Medical Diagnosis Z98.1 (ICD-10-CM) - Status post lumbar spinal fusion    Referring Provider (PT) Berneda Rose, PA      Strength   Right Hip Flexion 5/5    Right Hip Extension 5/5    Right Hip ABduction 4/5    Right Hip ADduction 5/5    Left Hip ABduction 4+/5    Left Hip ADduction 4+/5    Left Knee Flexion 5/5    Left Knee Extension 4+/5      Transfers   Five time sit to stand comments  11 sec no hands                         [facility] Adult PT Treatment/Exercise - 11/28/19 0001      Self-Care   Self-Care --    Other Self-Care Comments  --      Exercises   Exercises Lumbar;Knee/Hip      Lumbar Exercises: Aerobic   Nustep L3 x 8', PT present to discuss activity level      Lumbar Exercises: Standing   Other Standing Lumbar Exercises abdominal brace with hip flexion and abduction x10 reps     Other Standing Lumbar Exercises single leg stand - able to perform 1 sec Lt LE 2 sec Rt LE      Lumbar Exercises: Seated   Long Arc Quad on Chair Strengthening;Both;2 sets;10 reps;Weights    LAQ on Chair Weights (lbs) 2    Other Seated Lumbar Exercises --      Lumbar Exercises: Supine   Isometric Hip Flexion 10 reps;5 seconds    Isometric Hip Flexion Limitations both LE      Knee/Hip Exercises: Stretches   Active Hamstring Stretch Both;2 reps;30 seconds    Piriformis Stretch Both;2 reps;30 seconds    Other Knee/Hip Stretches adductor with strap 1x30 sec      Knee/Hip Exercises: Standing   Lateral Step Up Both;10 reps;Step Height: 4";Hand Hold: 1    SLS with LE march 2lb ankle weight hover over 1st step 2x10 sec, single fingertip support    Other Standing Knee Exercises march toe taps 1st step with 2lb ankle weights, PT cued TrA and glut med in stance leg, bil UE fingertip support      Shoulder Exercises: Seated   Other  Seated Exercises blue row standing -  20x                    PT Short Term Goals - 11/21/19 1420      PT SHORT TERM GOAL #1   Title Pt will be compliant and ind with initial HEP including walking 2-3 times a day for short duration as tol    Status Achieved      PT SHORT TERM GOAL #2   Title Pt will demo gait symmetry with improved step length and equal WB between bil LEs.    Status Achieved      PT SHORT TERM GOAL #3   Title Pt will be able to perform light household standing tasks x 5'    Status Achieved      PT SHORT TERM GOAL #4   Title Pt will report improved pain with daily household tasks by at least 20%    Status Achieved             PT Long Term Goals - 11/28/19 1720      PT LONG TERM GOAL #1   Title Pt will achieve at least 4+/5 for trunk and LE strength for improved tolerance of daily tasks.    Time 6    Period Weeks    Status On-going    Target Date 01/09/20      PT LONG TERM GOAL #2   Title Reduce FOTO to 55% to demo less limitation.    Status Achieved      PT LONG TERM GOAL #3   Title Pt will tolerate standing and walking tasks such as light housework and running errands x 20' with pain not to exceed 5/10.    Baseline reports 6/10 pain walking 13'    Status On-going      PT LONG TERM GOAL #4   Title Pt will be able to articulate at least two tools from HEP or behavioral modifications for pain management.    Status Achieved      PT LONG TERM GOAL #5   Title Pt will attend at least 1 aquatic PT session to learn ways to exercise in the pool to reduce load bearing strain while strengthening and building endurance.    Status Achieved      PT LONG TERM GOAL #6   Title Pt will be ind with advanced HEP and understand how to safely progress.    Time 6    Period Weeks    Status On-going    Target Date 01/09/20      PT LONG TERM GOAL #7   Title Be able to perform single leg stand for at least 3 sec bil LE for reduced risk of falls    Baseline  <2 sec    Time 6    Period Weeks    Status New    Target Date 01/09/20                 Plan - 11/28/19 1708    Clinical Impression Statement Pt has made excellent progress towards goals.  Discussed with patient that she did not want to be done with PT yet due to feeling unsure of her HEP and has only had one aquatic session.  Pt demonstrates improved LE strength but some weakness still on Lt LE.  Pt tolerated exercises today needing some seated rest breaks throughout. She did well with 5x sit to stand in 11 sec which is normal for her age.  Pt has difficulty with  single leg stand and can stay on Rt LE for 2 sec and Lt for 1 sec.  She is still getting 6/10 pain walking 13 minutes.  She will benefit from skilled PT to continue in order to work towards functional goals and improve balance for greater function and safety.    Comorbidities lumbar fusion revision L4/5 and new fusion L5/S1 April 2021, Lt TKR with poor outcome/pain, HTN, diabetes, asthma, osteopenia    Examination-Activity Limitations Locomotion Level;Bend;Squat;Stairs;Lift    Examination-Participation Restrictions Cleaning;Community Activity;Shop;Laundry    Stability/Clinical Decision Making Stable/Uncomplicated    PT Frequency 2x / week    PT Duration 6 weeks    PT Treatment/Interventions Aquatic Therapy;ADLs/Self Care Home Management;Cryotherapy;Electrical Stimulation;Moist Heat;Gait training;Stair training;Functional mobility training;Therapeutic exercise;Balance training;Neuromuscular re-education;Manual techniques;Patient/family education;Passive range of motion;Dry needling;Spinal Manipulations;Joint Manipulations;Taping    PT Next Visit Plan re-assessed goals - work on LE strength, core strength, single leg balance added toHEP    PT Home Exercise Plan Access Code: [email or web address]    Consulted and Agree with Plan of Care Patient           Patient will benefit from skilled therapeutic intervention in order to improve the  following deficits and impairments:  Abnormal gait, Decreased range of motion, Difficulty walking, Increased fascial restricitons, Decreased strength, Postural dysfunction, Improper body mechanics, Impaired flexibility, Hypomobility, Decreased scar mobility, Pain, Decreased activity tolerance, Decreased endurance, Decreased mobility  Visit Diagnosis: Chronic bilateral low back pain without sciatica  Muscle weakness (generalized)  Chronic pain of left knee     Problem List Patient Active Problem List   Diagnosis Date Noted  . Carotid arterial disease ([place]) 01/23/2019  . RBBB (right bundle branch block with left anterior fascicular block)   . Chest pain 01/22/2019  . Weakness of both lower extremities 01/02/2019  . Frequent falls 01/02/2019  . OSA on CPAP 06/15/2017  . Diabetes ([place]) 03/02/2017  . Herpes zoster without complication 62/86/3817  . B12 nutritional deficiency 02/23/2017  . Vitamin D deficiency 12/08/2016  . Tubular adenoma of colon 04/29/2016  . Essential hypertension 09/12/2015  . Allergic rhinitis 07/17/2015  . Insomnia 07/17/2015  . Obese 04/10/2015  . S/P left TKA 04/09/2015  . Osteopenia 12/04/2012  . 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 RESTLESS LEG SYNDROME 02/11/2009  . LEG EDEMA, BILATERAL 02/11/2009  . Hyperlipidemia 10/30/2008  . Urinary incontinence 10/30/2008  . Skin cancer 10/30/2008  . Asthma 11/21/2007  . Venous (peripheral) insufficiency 11/22/2006    Jule Ser, PT 11/28/2019, 5:27 PM  [facility] [address] [place], [place], [address] Phone: [phone]   Fax:  [phone]  Name: Erin Roach MRN: [id number] Date of Birth: [birth year]

## 2019-11-29 ENCOUNTER — Other Ambulatory Visit: Payer: Self-pay | Admitting: Internal Medicine

## 2019-11-29 DIAGNOSIS — M545 Low back pain, unspecified: Secondary | ICD-10-CM

## 2019-11-29 MED ORDER — HYDROCODONE-ACETAMINOPHEN 5-325 MG PO TABS: 1.0000 | ORAL_TABLET | Freq: Every day | ORAL | 0 refills | Status: DC

## 2019-12-01 ENCOUNTER — Ambulatory Visit: Payer: Medicare PPO | Admitting: Physical Therapy

## 2019-12-01 ENCOUNTER — Other Ambulatory Visit: Payer: Self-pay

## 2019-12-01 ENCOUNTER — Encounter: Payer: Self-pay | Admitting: Physical Therapy

## 2019-12-01 DIAGNOSIS — M545 Low back pain, unspecified: Secondary | ICD-10-CM

## 2019-12-01 DIAGNOSIS — M6281 Muscle weakness (generalized): Secondary | ICD-10-CM

## 2019-12-01 DIAGNOSIS — G8929 Other chronic pain: Secondary | ICD-10-CM

## 2019-12-01 DIAGNOSIS — R2689 Other abnormalities of gait and mobility: Secondary | ICD-10-CM | POA: Diagnosis not present

## 2019-12-01 DIAGNOSIS — M25562 Pain in left knee: Secondary | ICD-10-CM | POA: Diagnosis not present

## 2019-12-01 DIAGNOSIS — M62838 Other muscle spasm: Secondary | ICD-10-CM | POA: Diagnosis not present

## 2019-12-01 DIAGNOSIS — M25551 Pain in right hip: Secondary | ICD-10-CM | POA: Diagnosis not present

## 2019-12-01 DIAGNOSIS — M25552 Pain in left hip: Secondary | ICD-10-CM | POA: Diagnosis not present

## 2019-12-01 DIAGNOSIS — R262 Difficulty in walking, not elsewhere classified: Secondary | ICD-10-CM | POA: Diagnosis not present

## 2019-12-01 NOTE — Therapy (Signed)
[facility] [address], [place], [place], [address] Phone: [phone]   Fax:  [phone]  Physical Therapy Treatment  Patient Details  Name: Erin Roach MRN: [id number] Date of Birth: October 28, 1940 Referring Provider (PT): Berneda Rose, [place]   Encounter Date: 12/01/2019   PT End of Session - 12/01/19 1550    Visit Number 12    Date for PT Re-Evaluation 01/23/20    Authorization Type [employer]    Authorization - Visit Number 11    Authorization - Number of Visits 12    Progress Note Due on Visit 20    PT Start Time 1300    PT Stop Time 1345    PT Time Calculation (min) 45 min    Activity Tolerance Patient tolerated treatment well    Behavior During Therapy [facility] for tasks assessed/performed           Past Medical History:  Diagnosis Date  . Allergic rhinitis   . Apnea   . Arthritis   . Asthma    very mild  . Back pain   . Bilateral lower extremity edema   . Carotid artery disease ([place])    a. Carotid duplex 03/2016 - duplex was stable, 69-62% RICA, 9-52% LICA  . Complication of anesthesia    post-op delirium  . Endometrial polyp   . Essential hypertension   . GERD (gastroesophageal reflux disease)   . Heart murmur    per pt  . History of adenomatous polyp of colon    04-24-2016  tubular adenoma  . History of squamous cell carcinoma excision    pilonidal area second excision 02-02-2002  . HTN (hypertension)   . Hyperlipidemia   . Leg pain   . OAB (overactive bladder)   . RA (rheumatoid arthritis) ([place])   . RBBB (right bundle branch block with left anterior fascicular block)   . RLS (restless legs syndrome)   . Shortness of breath on exertion   . SUI (stress urinary incontinence, female)   . Venous insufficiency    legs  . Vitamin D deficiency     Past Surgical History:  Procedure Laterality Date  . APPENDECTOMY  07-02-2005   [name]   open  . CARDIAC CATHETERIZATION   01-23-2009  dr Darnell Level brodie   normal coronary arteries and lvf  . CARDIOVASCULAR STRESS TEST  03/31/2016   Low risk nuclear study w/ medium defect of mild severity in basal anterior and mid anterior location w/ no evidence ischemia or infarction/  normal LV function and wall motion,  nuclear stress ef 71%  . CATARACT EXTRACTION W/ INTRAOCULAR LENS IMPLANT Left 09/2016  . COLONOSCOPY  last one 04-24-2016  . HYSTEROSCOPY WITH D & C N/A 12/22/2016   Procedure: DILATATION AND CURETTAGE /HYSTEROSCOPY;  Surgeon: Dian Queen, MD;  Location: [place];  Service: Gynecology;  Laterality: N/A;  . IR US GUIDANCE  09/08/2016  . POSTERIOR LUMBAR FUSION  02/ 2018    [facility] ([place])  . SKIN CANCER EXCISION    . TONSILLECTOMY  child  . 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 TRANSTHORACIC ECHOCARDIOGRAM  01/21/2009   mild LVH, ef 84-13%, grade 1 diastolic dysfunction  . ULNAR NERVE TRANSPOSITION Left 12-20-2008  dr sypher   decompression and partial resection medial triceps fascia  . WIDE LOCAL EXCISION  PILONIDAL AREA  02-02-2002  [name]   recurrent squamous cell carcinoma in situ    There were no vitals filed for this visit.   Subjective Assessment - 12/01/19 1550    Subjective I was surprised how tired I was after my first pool session. My back was fine.    Pertinent History "Erin Roach";  left TKR;  lumbar fusion; restless legs, HTN; diabetes;  mild neuropathy;  osteopenia    Currently in Pain? No/denies           Treatment: Aquatics Water temp 87.6 degrees Pt entered and exit pool via 3 steps and heavy use of rails  Seated at water bench: 75% depth Ankle pumps 20x Bil Sit to stand 10x with VC to keep knees apart and feet neutral  Standing: Mid waist depth: 4 lengths of water walking in 3 directions, pt slow. Horizontal  decompression float x 1 min with blue noodle Static marching, difficult to maintain balance, 20x Hip 3 ways 10x bil, TC to feel hip movement in frontal plane vs rotating Decompression float more vertical with Vc to engage her core more.  Water bicycle x 5 min with blue noodle                            PT Short Term Goals - 11/21/19 1420      PT SHORT TERM GOAL #1   Title Pt will be compliant and ind with initial HEP including walking 2-3 times a day for short duration as tol    Status Achieved      PT SHORT TERM GOAL #2   Title Pt will demo gait symmetry with improved step length and equal WB between bil LEs.    Status Achieved      PT SHORT TERM GOAL #3   Title Pt will be able to perform light household standing tasks x 5'    Status Achieved      PT SHORT TERM GOAL #4   Title Pt will report improved pain with daily household tasks by at least 20%    Status Achieved             PT Long Term Goals - 11/28/19 1720      PT LONG TERM GOAL #1   Title Pt will achieve at least 4+/5 for trunk and LE strength for improved tolerance of daily tasks.    Time 6    Period Weeks    Status On-going    Target Date 01/09/20      PT LONG TERM GOAL #2   Title Reduce FOTO to 55% to demo less limitation.    Status Achieved      PT LONG TERM GOAL #3   Title Pt will tolerate standing and walking tasks such as light housework and running errands x 20' with pain not to exceed 5/10.    Baseline reports 6/10 pain walking 13'    Status On-going      PT LONG TERM GOAL #4   Title Pt will be able to articulate at least two tools from HEP or behavioral modifications for pain management.    Status Achieved      PT LONG TERM GOAL #5   Title Pt will attend at least 1 aquatic PT session to learn ways to exercise in the pool to reduce load bearing strain while strengthening and building endurance.    Status Achieved      PT LONG  TERM GOAL #6   Title Pt will be ind with  advanced HEP and understand how to safely progress.    Time 6    Period Weeks    Status On-going    Target Date 01/09/20      PT LONG TERM GOAL #7   Title Be able to perform single leg stand for at least 3 sec bil LE for reduced risk of falls    Baseline <2 sec    Time 6    Period Weeks    Status New    Target Date 01/09/20                 Plan - 12/01/19 1551    Clinical Impression Statement Pt arrives pain free for her second aquatic PT session. She was mainly tired after her first treatment and reports she still struggles with Lt knee pain. She sees her MD next week to evaluate her knee. Pt shows some improvement in her bodily control in the water today, not as wobbly when she walks but most wobbly during marching or any other single leg stance exercise. Pt does not use her glute medius very well as she prefers to externally rotate at the hip vs abduct. Pt does move slowly and guarded, this may be more habitual and not due to pain. Pt reported no pain while exercising in the water. Pt maintains mid waist depth most of the session and required less decompression floats for rest.    Personal Factors and Comorbidities Age;Past/Current Experience;Comorbidity 1;Comorbidity 2;Comorbidity 3+    Comorbidities lumbar fusion revision L4/5 and new fusion L5/S1 April 2021, Lt TKR with poor outcome/pain, HTN, diabetes, asthma, osteopenia    Examination-Activity Limitations Locomotion Level;Bend;Squat;Stairs;Lift    Examination-Participation Restrictions Cleaning;Community Activity;Shop;Laundry    Stability/Clinical Decision Making Stable/Uncomplicated    Rehab Potential Good    PT Frequency 2x / week    PT Duration 6 weeks    PT Treatment/Interventions Aquatic Therapy;ADLs/Self Care Home Management;Cryotherapy;Electrical Stimulation;Moist Heat;Gait training;Stair training;Functional mobility training;Therapeutic exercise;Balance training;Neuromuscular re-education;Manual  techniques;Patient/family education;Passive range of motion;Dry needling;Spinal Manipulations;Joint Manipulations;Taping    PT Next Visit Plan Pt may be able to get another aquatic session next week due to a cancellation.    PT Home Exercise Plan Access Code: [email or web address]    Consulted and Agree with Plan of Care Patient           Patient will benefit from skilled therapeutic intervention in order to improve the following deficits and impairments:  Abnormal gait, Decreased range of motion, Difficulty walking, Increased fascial restricitons, Decreased strength, Postural dysfunction, Improper body mechanics, Impaired flexibility, Hypomobility, Decreased scar mobility, Pain, Decreased activity tolerance, Decreased endurance, Decreased mobility  Visit Diagnosis: Chronic bilateral low back pain without sciatica  Muscle weakness (generalized)     Problem List Patient Active Problem List   Diagnosis Date Noted  . Carotid arterial disease ([place]) 01/23/2019  . RBBB (right bundle branch block with left anterior fascicular block)   . Chest pain 01/22/2019  . Weakness of both lower extremities 01/02/2019  . Frequent falls 01/02/2019  . OSA on CPAP 06/15/2017  . Diabetes ([place]) 03/02/2017  . Herpes zoster without complication 35/32/9924  . B12 nutritional deficiency 02/23/2017  . Vitamin D deficiency 12/08/2016  . Tubular adenoma of colon 04/29/2016  . Essential hypertension 09/12/2015  . Allergic rhinitis 07/17/2015  . Insomnia 07/17/2015  . Obese 04/10/2015  . S/P left TKA 04/09/2015  . Osteopenia 12/04/2012  . 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  RESTLESS LEG SYNDROME 02/11/2009  . LEG EDEMA, BILATERAL 02/11/2009  . Hyperlipidemia 10/30/2008  . Urinary incontinence 10/30/2008  . Skin cancer 10/30/2008  . Asthma 11/21/2007  . Venous (peripheral) insufficiency 11/22/2006    Erin Roach, PTA 12/01/2019, 4:00 PM  [facility] [address], [place], [place], [address] Phone: [phone]   Fax:  [phone]  Name: Erin Roach MRN: [id number] Date of Birth: 22-Jul-1940

## 2019-12-05 ENCOUNTER — Other Ambulatory Visit: Payer: Self-pay

## 2019-12-05 ENCOUNTER — Ambulatory Visit: Payer: Medicare PPO | Admitting: Physical Therapy

## 2019-12-05 ENCOUNTER — Encounter: Payer: Self-pay | Admitting: Physical Therapy

## 2019-12-05 DIAGNOSIS — M25562 Pain in left knee: Secondary | ICD-10-CM

## 2019-12-05 DIAGNOSIS — M25551 Pain in right hip: Secondary | ICD-10-CM | POA: Diagnosis not present

## 2019-12-05 DIAGNOSIS — R2689 Other abnormalities of gait and mobility: Secondary | ICD-10-CM | POA: Diagnosis not present

## 2019-12-05 DIAGNOSIS — G8929 Other chronic pain: Secondary | ICD-10-CM | POA: Diagnosis not present

## 2019-12-05 DIAGNOSIS — M62838 Other muscle spasm: Secondary | ICD-10-CM | POA: Diagnosis not present

## 2019-12-05 DIAGNOSIS — M545 Low back pain, unspecified: Secondary | ICD-10-CM

## 2019-12-05 DIAGNOSIS — R262 Difficulty in walking, not elsewhere classified: Secondary | ICD-10-CM | POA: Diagnosis not present

## 2019-12-05 DIAGNOSIS — M6281 Muscle weakness (generalized): Secondary | ICD-10-CM

## 2019-12-05 DIAGNOSIS — M25552 Pain in left hip: Secondary | ICD-10-CM | POA: Diagnosis not present

## 2019-12-05 NOTE — Therapy (Signed)
[facility] [address] [place], [place], [address] Phone: [phone]   Fax:  [phone]  Physical Therapy Treatment  Patient Details  Name: CLARICE ZULAUF MRN: [id number] Date of Birth: December 23, 1940 Referring Provider (PT): Berneda Rose, [place]   Encounter Date: 12/05/2019   PT End of Session - 12/05/19 1457    Visit Number 13    Date for PT Re-Evaluation 01/23/20    Authorization Type [employer]    Authorization Time Period Cohere approved 12 visits, 11/28/19-01/23/20    Authorization - Visit Number 13    Authorization - Number of Visits 24    Progress Note Due on Visit 20    PT Start Time 5366    PT Stop Time 1529    PT Time Calculation (min) 42 min    Activity Tolerance Patient tolerated treatment well    Behavior During Therapy [facility] for tasks assessed/performed           Past Medical History:  Diagnosis Date  . Allergic rhinitis   . Apnea   . Arthritis   . Asthma    very mild  . Back pain   . Bilateral lower extremity edema   . Carotid artery disease ([place])    a. Carotid duplex 03/2016 - duplex was stable, 44-03% RICA, 4-74% LICA  . Complication of anesthesia    post-op delirium  . Endometrial polyp   . Essential hypertension   . GERD (gastroesophageal reflux disease)   . Heart murmur    per pt  . History of adenomatous polyp of colon    04-24-2016  tubular adenoma  . History of squamous cell carcinoma excision    pilonidal area second excision 02-02-2002  . HTN (hypertension)   . Hyperlipidemia   . Leg pain   . OAB (overactive bladder)   . RA (rheumatoid arthritis) ([place])   . RBBB (right bundle branch block with left anterior fascicular block)   . RLS (restless legs syndrome)   . Shortness of breath on exertion   . SUI (stress urinary incontinence, female)   . Venous insufficiency    legs  . Vitamin D deficiency     Past Surgical History:  Procedure Laterality Date  . APPENDECTOMY   07-02-2005   [name]   open  . CARDIAC CATHETERIZATION  01-23-2009  dr Darnell Level brodie   normal coronary arteries and lvf  . CARDIOVASCULAR STRESS TEST  03/31/2016   Low risk nuclear study w/ medium defect of mild severity in basal anterior and mid anterior location w/ no evidence ischemia or infarction/  normal LV function and wall motion,  nuclear stress ef 71%  . CATARACT EXTRACTION W/ INTRAOCULAR LENS IMPLANT Left 09/2016  . COLONOSCOPY  last one 04-24-2016  . HYSTEROSCOPY WITH D & C N/A 12/22/2016   Procedure: DILATATION AND CURETTAGE /HYSTEROSCOPY;  Surgeon: Dian Queen, MD;  Location: [place];  Service: Gynecology;  Laterality: N/A;  . IR US GUIDANCE  09/08/2016  . POSTERIOR LUMBAR FUSION  02/ 2018    [facility] ([place], [place])  . SKIN CANCER EXCISION    . TONSILLECTOMY  child  . 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 TRANSTHORACIC ECHOCARDIOGRAM  01/21/2009   mild LVH, ef 25-95%, grade 1 diastolic dysfunction  . ULNAR NERVE TRANSPOSITION Left 12-20-2008  dr sypher   decompression  and partial resection medial triceps fascia  . WIDE LOCAL EXCISION PILONIDAL AREA  02-02-2002  [name]   recurrent squamous cell carcinoma in situ    There were no vitals filed for this visit.   Subjective Assessment - 12/05/19 1450    Subjective I am trying to walk with my feet forward vs turned out and it is making me sore.    Pertinent History "Arrie";  left TKR;  lumbar fusion; restless legs, HTN; diabetes;  mild neuropathy;  osteopenia    Limitations Sitting;Lifting;Walking;House hold activities    How long can you sit comfortably? 45 min    How long can you stand comfortably? 10 min if distracted and not thinking about it    How long can you walk comfortably? 61'    Diagnostic tests none    Patient Stated Goals I would like to  be able to walk 30 minutes, more balanced    Currently in Pain? Yes    Pain Score 2     Pain Location Back    Pain Orientation Lower;Mid    Pain Descriptors / Indicators Aching    Pain Type Chronic pain    Pain Onset More than a month ago    Aggravating Factors  walking    Pain Relieving Factors exercises, ice/heat    Aggravating Factors  walk/stand    Pain Relieving Factors exercises                             [facility] Adult PT Treatment/Exercise - 12/05/19 0001      Exercises   Exercises Lumbar;Knee/Hip      Lumbar Exercises: Aerobic   Nustep L3 x 10', PT present to review progress and plan for future appts      Lumbar Exercises: Supine   Other Supine Lumbar Exercises feet on red ball yellow band pulldowns PT anchors middle of band over Pt's head x 15      Knee/Hip Exercises: Stretches   Piriformis Stretch Both;1 rep;30 seconds    Piriformis Stretch Limitations pull knee across      Knee/Hip Exercises: Standing   Lateral Step Up Both;1 set;Hand Hold: 2;Hand Hold: 1;Step Height: 6";10 reps    SLS Rt and Lt with intermittent fingertip support single hand x 1' each      Knee/Hip Exercises: Seated   Long Arc Quad Strengthening;Both;Weights;2 sets;10 reps    Long Arc Quad Weight 2 lbs.    Marching Strengthening;Both;20 reps;Weights    Marching Limitations 2lb      Knee/Hip Exercises: Sidelying   Hip ABduction Strengthening;Both;2 sets;5 reps      Shoulder Exercises: Standing   Row Strengthening;Both;20 reps;Theraband    Theraband Level (Shoulder Row) Level 4 (Blue)                    PT Short Term Goals - 11/21/19 1420      PT SHORT TERM GOAL #1   Title Pt will be compliant and ind with initial HEP including walking 2-3 times a day for short duration as tol    Status Achieved      PT SHORT TERM GOAL #2   Title Pt will demo gait symmetry with improved step length and equal WB between bil LEs.    Status Achieved      PT SHORT TERM GOAL  #3   Title Pt will be able to perform light household standing tasks x 5'  Status Achieved      PT SHORT TERM GOAL #4   Title Pt will report improved pain with daily household tasks by at least 20%    Status Achieved             PT Long Term Goals - 11/28/19 1720      PT LONG TERM GOAL #1   Title Pt will achieve at least 4+/5 for trunk and LE strength for improved tolerance of daily tasks.    Time 6    Period Weeks    Status On-going    Target Date 01/09/20      PT LONG TERM GOAL #2   Title Reduce FOTO to 55% to demo less limitation.    Status Achieved      PT LONG TERM GOAL #3   Title Pt will tolerate standing and walking tasks such as light housework and running errands x 20' with pain not to exceed 5/10.    Baseline reports 6/10 pain walking 13'    Status On-going      PT LONG TERM GOAL #4   Title Pt will be able to articulate at least two tools from HEP or behavioral modifications for pain management.    Status Achieved      PT LONG TERM GOAL #5   Title Pt will attend at least 1 aquatic PT session to learn ways to exercise in the pool to reduce load bearing strain while strengthening and building endurance.    Status Achieved      PT LONG TERM GOAL #6   Title Pt will be ind with advanced HEP and understand how to safely progress.    Time 6    Period Weeks    Status On-going    Target Date 01/09/20      PT LONG TERM GOAL #7   Title Be able to perform single leg stand for at least 3 sec bil LE for reduced risk of falls    Baseline <2 sec    Time 6    Period Weeks    Status New    Target Date 01/09/20                 Plan - 12/05/19 1527    Clinical Impression Statement Pt rates LBP as very tolerable and a 2/10.  PT focused on hip abd strength, endurance on NuStep, and SLS balance today.  Pt is limited by Lt knee pain for lateral step ups but tolerated SL hip abduction well.  She needed intermittent cueing not to externally rotate LE during hip abd.   PT encouraged continued work in [place] at counter to work towards balance goal.  Pt has improved awareness of posture and core recruitment during ther ex.  Pt will continue to benefit from a combination of aquatic and land based PT for exercise tolerance and progression of strength and balance toward remaining deficits for goals.    Comorbidities lumbar fusion revision L4/5 and new fusion L5/S1 April 2021, Lt TKR with poor outcome/pain, HTN, diabetes, asthma, osteopenia    Rehab Potential Good    PT Frequency 2x / week    PT Duration 6 weeks    PT Treatment/Interventions Aquatic Therapy;ADLs/Self Care Home Management;Cryotherapy;Electrical Stimulation;Moist Heat;Gait training;Stair training;Functional mobility training;Therapeutic exercise;Balance training;Neuromuscular re-education;Manual techniques;Patient/family education;Passive range of motion;Dry needling;Spinal Manipulations;Joint Manipulations;Taping    PT Next Visit Plan schedule aquatics and land based x 4-6 more visits before d/c, continue hip abd strength bil, SLS balance, NuStep endurance  10', core    PT Home Exercise Plan Access Code: [email or web address]    Consulted and Agree with Plan of Care Patient           Patient will benefit from skilled therapeutic intervention in order to improve the following deficits and impairments:  Abnormal gait, Decreased range of motion, Difficulty walking, Increased fascial restricitons, Decreased strength, Postural dysfunction, Improper body mechanics, Impaired flexibility, Hypomobility, Decreased scar mobility, Pain, Decreased activity tolerance, Decreased endurance, Decreased mobility  Visit Diagnosis: Chronic bilateral low back pain without sciatica  Muscle weakness (generalized)  Chronic pain of left knee  Other abnormalities of gait and mobility     Problem List Patient Active Problem List   Diagnosis Date Noted  . Carotid arterial disease ([place]) 01/23/2019  . RBBB (right bundle branch block  with left anterior fascicular block)   . Chest pain 01/22/2019  . Weakness of both lower extremities 01/02/2019  . Frequent falls 01/02/2019  . OSA on CPAP 06/15/2017  . Diabetes ([place]) 03/02/2017  . Herpes zoster without complication 23/53/6144  . B12 nutritional deficiency 02/23/2017  . Vitamin D deficiency 12/08/2016  . Tubular adenoma of colon 04/29/2016  . Essential hypertension 09/12/2015  . Allergic rhinitis 07/17/2015  . Insomnia 07/17/2015  . Obese 04/10/2015  . S/P left TKA 04/09/2015  . Osteopenia 12/04/2012  . 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 RESTLESS LEG SYNDROME 02/11/2009  . LEG EDEMA, BILATERAL 02/11/2009  . Hyperlipidemia 10/30/2008  . Urinary incontinence 10/30/2008  . Skin cancer 10/30/2008  . Asthma 11/21/2007  . Venous (peripheral) insufficiency 11/22/2006    Baruch Merl, PT 12/05/19 3:31 PM   [facility] [address] [place], [place], [address] Phone: [phone]   Fax:  [phone]  Name: SUPRENA TRAVAGLINI MRN: [id number] Date of Birth: 25-Oct-1940

## 2019-12-07 ENCOUNTER — Other Ambulatory Visit (HOSPITAL_COMMUNITY): Payer: Self-pay | Admitting: Orthopedic Surgery

## 2019-12-07 ENCOUNTER — Other Ambulatory Visit: Payer: Self-pay | Admitting: Orthopedic Surgery

## 2019-12-07 DIAGNOSIS — M25562 Pain in left knee: Secondary | ICD-10-CM | POA: Diagnosis not present

## 2019-12-07 DIAGNOSIS — Z96652 Presence of left artificial knee joint: Secondary | ICD-10-CM | POA: Diagnosis not present

## 2019-12-08 ENCOUNTER — Other Ambulatory Visit: Payer: Self-pay

## 2019-12-08 ENCOUNTER — Ambulatory Visit: Payer: Medicare PPO | Admitting: Physical Therapy

## 2019-12-08 ENCOUNTER — Encounter: Payer: Self-pay | Admitting: Physical Therapy

## 2019-12-08 DIAGNOSIS — M6281 Muscle weakness (generalized): Secondary | ICD-10-CM

## 2019-12-08 DIAGNOSIS — R262 Difficulty in walking, not elsewhere classified: Secondary | ICD-10-CM | POA: Diagnosis not present

## 2019-12-08 DIAGNOSIS — M25562 Pain in left knee: Secondary | ICD-10-CM | POA: Diagnosis not present

## 2019-12-08 DIAGNOSIS — M545 Low back pain, unspecified: Secondary | ICD-10-CM

## 2019-12-08 DIAGNOSIS — M25551 Pain in right hip: Secondary | ICD-10-CM | POA: Diagnosis not present

## 2019-12-08 DIAGNOSIS — R2689 Other abnormalities of gait and mobility: Secondary | ICD-10-CM

## 2019-12-08 DIAGNOSIS — M25552 Pain in left hip: Secondary | ICD-10-CM

## 2019-12-08 DIAGNOSIS — G8929 Other chronic pain: Secondary | ICD-10-CM

## 2019-12-08 DIAGNOSIS — M62838 Other muscle spasm: Secondary | ICD-10-CM

## 2019-12-08 NOTE — Therapy (Signed)
[facility] [address] [place], [place], [address] Phone: [phone]   Fax:  [phone]  Physical Therapy Treatment  Patient Details  Name: Erin Roach MRN: [id number] Date of Birth: 1940-08-09 Referring Provider (PT): Berneda Rose, [place]   Encounter Date: 12/08/2019   PT End of Session - 12/08/19 1553    Visit Number 14    Date for PT Re-Evaluation 01/23/20    Authorization Type [employer]    Authorization Time Period [place]here approved 12 visits, 11/28/19-01/23/20    Authorization - Visit Number 14    Authorization - Number of Visits 24    Progress Note Due on Visit 20    PT Start Time 1215    PT Stop Time 1305    PT Time Calculation (min) 50 min    Activity Tolerance Patient tolerated treatment well    Behavior During Therapy [facility] for tasks assessed/performed           Past Medical History:  Diagnosis Date  . Allergic rhinitis   . Apnea   . Arthritis   . Asthma    very mild  . Back pain   . Bilateral lower extremity edema   . Carotid artery disease ([place])    a. Carotid duplex 03/2016 - duplex was stable, 60-73% RICA, 7-10% LICA  . Complication of anesthesia    post-op delirium  . Endometrial polyp   . Essential hypertension   . GERD (gastroesophageal reflux disease)   . Heart murmur    per pt  . History of adenomatous polyp of colon    04-24-2016  tubular adenoma  . History of squamous cell carcinoma excision    pilonidal area second excision 02-02-2002  . HTN (hypertension)   . Hyperlipidemia   . Leg pain   . OAB (overactive bladder)   . RA (rheumatoid arthritis) ([place])   . RBBB (right bundle branch block with left anterior fascicular block)   . RLS (restless legs syndrome)   . Shortness of breath on exertion   . SUI (stress urinary incontinence, female)   . Venous insufficiency    legs  . Vitamin D deficiency     Past Surgical History:  Procedure Laterality Date  . APPENDECTOMY   07-02-2005   [name]   open  . CARDIAC CATHETERIZATION  01-23-2009  dr Darnell Level brodie   normal coronary arteries and lvf  . CARDIOVASCULAR STRESS TEST  03/31/2016   Low risk nuclear study w/ medium defect of mild severity in basal anterior and mid anterior location w/ no evidence ischemia or infarction/  normal LV function and wall motion,  nuclear stress ef 71%  . CATARACT EXTRACTION W/ INTRAOCULAR LENS IMPLANT Left 09/2016  . COLONOSCOPY  last one 04-24-2016  . HYSTEROSCOPY WITH D & C N/A 12/22/2016   Procedure: DILATATION AND CURETTAGE /HYSTEROSCOPY;  Surgeon: Dian Queen, MD;  Location: [place];  Service: Gynecology;  Laterality: N/A;  . IR US GUIDANCE  09/08/2016  . POSTERIOR LUMBAR FUSION  02/ 2018    [facility] ([place], [place])  . SKIN CANCER EXCISION    . TONSILLECTOMY  child  . 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 TRANSTHORACIC ECHOCARDIOGRAM  01/21/2009   mild LVH, ef 62-69%, grade 1 diastolic dysfunction  . ULNAR NERVE TRANSPOSITION Left 12-20-2008  dr sypher   decompression  and partial resection medial triceps fascia  . WIDE LOCAL EXCISION PILONIDAL AREA  02-02-2002  [name]   recurrent squamous cell carcinoma in situ    There were no vitals filed for this visit.   Subjective Assessment - 12/08/19 1552    Subjective I saw Dr [name] Ricks for my Lt knee pain. The pain has been worse since the evaluation.    Pertinent History "[name]";  left TKR;  lumbar fusion; restless legs, HTN; diabetes;  mild neuropathy;  osteopenia    Currently in Pain? Yes    Pain Score 6     Pain Location Knee    Pain Orientation Left    Pain Descriptors / Indicators Tender           Patient seen for aquatic therapy today.  Treatment took place in water 2.5-4 feet deep depending upon activity.  Pt entered the pool via 3 stairs to  87.6 degrees F.  Seated water bench with 75% submersion Pt performed seated LE AROM exercises 20x in all planes,  Standing in mid chest/waist depth water pt performed water walking in all 4 directions 2 lengths of pool. VC for speed in order to generate appropriate current for resistance. Hip 3 ways 20x ea with min support of pool wall. VC to push/pull against water with a force appropriate. Core contractions in partial wall squat with blue noodle 5 sec hold 10x. Bil heel raises 20x no UE support .High knee marching with blue noodle 2 full lengths of pool. Decompression float x 1 min 2 bouts.                               PT Short Term Goals - 11/21/19 1420      PT SHORT TERM GOAL #1   Title Pt will be compliant and ind with initial HEP including walking 2-3 times a day for short duration as tol    Status Achieved      PT SHORT TERM GOAL #2   Title Pt will demo gait symmetry with improved step length and equal WB between bil LEs.    Status Achieved      PT SHORT TERM GOAL #3   Title Pt will be able to perform light household standing tasks x 5'    Status Achieved      PT SHORT TERM GOAL #4   Title Pt will report improved pain with daily household tasks by at least 20%    Status Achieved             PT Long Term Goals - 11/28/19 1720      PT LONG TERM GOAL #1   Title Pt will achieve at least 4+/5 for trunk and LE strength for improved tolerance of daily tasks.    Time 6    Period Weeks    Status On-going    Target Date 01/09/20      PT LONG TERM GOAL #2   Title Reduce FOTO to 55% to demo less limitation.    Status Achieved      PT LONG TERM GOAL #3   Title Pt will tolerate standing and walking tasks such as light housework and running errands x 20' with pain not to exceed 5/10.    Baseline reports 6/10 pain walking 13'    Status On-going      PT LONG TERM GOAL #4   Title Pt will be able to articulate at  least two tools from HEP or behavioral  modifications for pain management.    Status Achieved      PT LONG TERM GOAL #5   Title Pt will attend at least 1 aquatic PT session to learn ways to exercise in the pool to reduce load bearing strain while strengthening and building endurance.    Status Achieved      PT LONG TERM GOAL #6   Title Pt will be ind with advanced HEP and understand how to safely progress.    Time 6    Period Weeks    Status On-going    Target Date 01/09/20      PT LONG TERM GOAL #7   Title Be able to perform single leg stand for at least 3 sec bil LE for reduced risk of falls    Baseline <2 sec    Time 6    Period Weeks    Status New    Target Date 01/09/20                 Plan - 12/08/19 1554    Clinical Impression Statement Pt arrives with no back pain complaints but Lt knee pain that has been aggrevated since the MD evaluated it this week. Pain was not increased with todays aquatic treatment. Pt has been working on improving her Lt hip abduction and not externally rotating. She still moves very rigidly and was cuing to practice softening up her movements as she feels her glute medius better. Pt continues to move slow in the water but had no pain complaints, some fatigue in her back that abolished with < 30 sec of rest.    Personal Factors and Comorbidities Age;Past/Current Experience;Comorbidity 1;Comorbidity 2;Comorbidity 3+    Comorbidities lumbar fusion revision L4/5 and new fusion L5/S1 April 2021, Lt TKR with poor outcome/pain, HTN, diabetes, asthma, osteopenia    Examination-Activity Limitations Locomotion Level;Bend;Squat;Stairs;Lift    Examination-Participation Restrictions Cleaning;Community Activity;Shop;Laundry    Stability/Clinical Decision Making Stable/Uncomplicated    PT Duration 6 weeks    PT Treatment/Interventions Aquatic Therapy;ADLs/Self Care Home Management;Cryotherapy;Electrical Stimulation;Moist Heat;Gait training;Stair training;Functional mobility training;Therapeutic  exercise;Balance training;Neuromuscular re-education;Manual techniques;Patient/family education;Passive range of motion;Dry needling;Spinal Manipulations;Joint Manipulations;Taping    PT Next Visit Plan Hip strength, SLS balance, core strength progression    PT Home Exercise Plan Access Code: [email or web address]    Consulted and Agree with Plan of Care Patient           Patient will benefit from skilled therapeutic intervention in order to improve the following deficits and impairments:  Abnormal gait, Decreased range of motion, Difficulty walking, Increased fascial restricitons, Decreased strength, Postural dysfunction, Improper body mechanics, Impaired flexibility, Hypomobility, Decreased scar mobility, Pain, Decreased activity tolerance, Decreased endurance, Decreased mobility  Visit Diagnosis: Chronic bilateral low back pain without sciatica  Muscle weakness (generalized)  Other abnormalities of gait and mobility  Pain in right hip  Difficulty in walking, not elsewhere classified  Pain in left hip  Chronic pain of left knee  Other muscle spasm     Problem List Patient Active Problem List   Diagnosis Date Noted  . Carotid arterial disease ([place]) 01/23/2019  . RBBB (right bundle branch block with left anterior fascicular block)   . Chest pain 01/22/2019  . Weakness of both lower extremities 01/02/2019  . Frequent falls 01/02/2019  . OSA on CPAP 06/15/2017  . Diabetes ([place]) 03/02/2017  . Herpes zoster without complication 29/51/8841  . B12 nutritional deficiency 02/23/2017  . Vitamin D deficiency  12/08/2016  . Tubular adenoma of colon 04/29/2016  . Essential hypertension 09/12/2015  . Allergic rhinitis 07/17/2015  . Insomnia 07/17/2015  . Obese 04/10/2015  . S/P left TKA 04/09/2015  . Osteopenia 12/04/2012  . 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 RESTLESS LEG SYNDROME 02/11/2009  . LEG EDEMA, BILATERAL 02/11/2009  . Hyperlipidemia 10/30/2008  . Urinary incontinence 10/30/2008  .  Skin cancer 10/30/2008  . Asthma 11/21/2007  . Venous (peripheral) insufficiency 11/22/2006    Fields Oros, PTA 12/08/2019, 4:00 PM   [facility] [address] [place], [place], [address] Phone: [phone]   Fax:  [phone]  Name: [name] MRN: [id number] Date of Birth: May 25, 1940

## 2019-12-13 ENCOUNTER — Ambulatory Visit: Payer: Medicare PPO | Attending: Physician Assistant | Admitting: Physical Therapy

## 2019-12-13 ENCOUNTER — Other Ambulatory Visit: Payer: Self-pay

## 2019-12-13 ENCOUNTER — Encounter: Payer: Self-pay | Admitting: Physical Therapy

## 2019-12-13 DIAGNOSIS — R2689 Other abnormalities of gait and mobility: Secondary | ICD-10-CM | POA: Diagnosis not present

## 2019-12-13 DIAGNOSIS — M25551 Pain in right hip: Secondary | ICD-10-CM | POA: Diagnosis not present

## 2019-12-13 DIAGNOSIS — G8929 Other chronic pain: Secondary | ICD-10-CM | POA: Diagnosis not present

## 2019-12-13 DIAGNOSIS — R262 Difficulty in walking, not elsewhere classified: Secondary | ICD-10-CM | POA: Diagnosis not present

## 2019-12-13 DIAGNOSIS — M545 Low back pain, unspecified: Secondary | ICD-10-CM

## 2019-12-13 DIAGNOSIS — M6281 Muscle weakness (generalized): Secondary | ICD-10-CM | POA: Insufficient documentation

## 2019-12-13 DIAGNOSIS — M25562 Pain in left knee: Secondary | ICD-10-CM | POA: Diagnosis not present

## 2019-12-13 NOTE — Therapy (Signed)
[facility] [address] [place], [place], [address] Phone: [phone]   Fax:  [phone]  Physical Therapy Treatment  Patient Details  Name: Erin Roach MRN: [id number] Date of Birth: 1941-02-20 Referring Provider (PT): Berneda Rose, [place]   Encounter Date: 12/13/2019   PT End of Session - 12/13/19 0848    Visit Number 15    Date for PT Re-Evaluation 01/23/20    Authorization Type [employer]    Authorization Time Period Cohere approved 12 visits, 11/28/19-01/23/20    Authorization - Visit Number 15    Authorization - Number of Visits 24    Progress Note Due on Visit 20    PT Start Time 0843    PT Stop Time 0925    PT Time Calculation (min) 42 min    Activity Tolerance Patient tolerated treatment well    Behavior During Therapy [facility] for tasks assessed/performed           Past Medical History:  Diagnosis Date  . Allergic rhinitis   . Apnea   . Arthritis   . Asthma    very mild  . Back pain   . Bilateral lower extremity edema   . Carotid artery disease ([place])    a. Carotid duplex 03/2016 - duplex was stable, 17-40% RICA, 8-14% LICA  . Complication of anesthesia    post-op delirium  . Endometrial polyp   . Essential hypertension   . GERD (gastroesophageal reflux disease)   . Heart murmur    per pt  . History of adenomatous polyp of colon    04-24-2016  tubular adenoma  . History of squamous cell carcinoma excision    pilonidal area second excision 02-02-2002  . HTN (hypertension)   . Hyperlipidemia   . Leg pain   . OAB (overactive bladder)   . RA (rheumatoid arthritis) ([place])   . RBBB (right bundle branch block with left anterior fascicular block)   . RLS (restless legs syndrome)   . Shortness of breath on exertion   . SUI (stress urinary incontinence, female)   . Venous insufficiency    legs  . Vitamin D deficiency     Past Surgical History:  Procedure Laterality Date  . APPENDECTOMY   07-02-2005   [name]   open  . CARDIAC CATHETERIZATION  01-23-2009  dr Darnell Level brodie   normal coronary arteries and lvf  . CARDIOVASCULAR STRESS TEST  03/31/2016   Low risk nuclear study w/ medium defect of mild severity in basal anterior and mid anterior location w/ no evidence ischemia or infarction/  normal LV function and wall motion,  nuclear stress ef 71%  . CATARACT EXTRACTION W/ INTRAOCULAR LENS IMPLANT Left 09/2016  . COLONOSCOPY  last one 04-24-2016  . HYSTEROSCOPY WITH D & C N/A 12/22/2016   Procedure: DILATATION AND CURETTAGE /HYSTEROSCOPY;  Surgeon: Dian Queen, MD;  Location: [place];  Service: Gynecology;  Laterality: N/A;  . IR US GUIDANCE  09/08/2016  . POSTERIOR LUMBAR FUSION  02/ 2018    [facility] ([place])  . SKIN CANCER EXCISION    . TONSILLECTOMY  child  . 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 TRANSTHORACIC ECHOCARDIOGRAM  01/21/2009   mild LVH, ef 48-18%, grade 1 diastolic dysfunction  . ULNAR NERVE TRANSPOSITION Left 12-20-2008  dr sypher   decompression  and partial resection medial triceps fascia  . WIDE LOCAL EXCISION PILONIDAL AREA  02-02-2002  [name]   recurrent squamous cell carcinoma in situ    There were no vitals filed for this visit.   Subjective Assessment - 12/13/19 0844    Subjective I got 2lb ankle weights and am using them.    Pertinent History "[place]";  left TKR;  lumbar fusion; restless legs, HTN; diabetes;  mild neuropathy;  osteopenia    How long can you sit comfortably? 45 min    How long can you stand comfortably? 10 min if distracted and not thinking about it    How long can you walk comfortably? 76'    Diagnostic tests none    Patient Stated Goals I would like to be able to walk 30 minutes, more balanced    Currently in Pain? No/denies    Pain Location Back      Multiple Pain Sites Yes    Pain Score 4    Pain Location Knee                             [facility] Adult PT Treatment/Exercise - 12/13/19 0001      Lumbar Exercises: Stretches   Lower Trunk Rotation Limitations 10 reps 3 sec    Piriformis Stretch 1 rep;30 seconds;Right;Left      Lumbar Exercises: Aerobic   Nustep L3 x 9' PT present to discuss POC moving forward      Lumbar Exercises: Standing   Other Standing Lumbar Exercises pallof press blue x 10 standing Rt/Lt from band origin      Lumbar Exercises: Supine   Straight Leg Raise 10 reps    Straight Leg Raises Limitations PT cued TrA indrawing vs doming and bracing      Knee/Hip Exercises: Standing   Lateral Step Up Both;1 set;10 reps;Step Height: 6";Hand Hold: 2   only 5 reps on Lt knee due to pain   Lateral Step Up Limitations PT cued knee alignment     Forward Step Up Both;2 sets;5 reps;Hand Hold: 2;Step Height: 6"    Forward Step Up Limitations fingertip support bil, PT cued don't pull with UEs, drive through gluts, hold x 10 sec SLS march foot hovered over 2nd step on 5th rep each set      Knee/Hip Exercises: Supine   Bridges Strengthening;Both;3 sets;5 reps    Bridges Limitations 1x5 fig 4 bridge but Pt with weakness and imbalance with this, d/c'd      Knee/Hip Exercises: Sidelying   Hip ABduction Strengthening;Both;2 sets;5 reps    Hip ABduction Limitations Pt needed max cueing for hip stacking, avoiding pelvic rotation, TrA and glut med activation      Shoulder Exercises: Standing   Row Strengthening;Both;15 reps;Theraband    Theraband Level (Shoulder Row) Level 4 (Blue)                    PT Short Term Goals - 11/21/19 1420      PT SHORT TERM GOAL #1   Title Pt will be compliant and ind with initial HEP including walking 2-3 times a day for short duration as tol    Status Achieved      PT SHORT TERM GOAL #2   Title Pt will demo gait symmetry with improved step length and equal WB  between bil LEs.    Status Achieved      PT SHORT TERM GOAL #3  Title Pt will be able to perform light household standing tasks x 5'    Status Achieved      PT SHORT TERM GOAL #4   Title Pt will report improved pain with daily household tasks by at least 20%    Status Achieved             PT Long Term Goals - 11/28/19 1720      PT LONG TERM GOAL #1   Title Pt will achieve at least 4+/5 for trunk and LE strength for improved tolerance of daily tasks.    Time 6    Period Weeks    Status On-going    Target Date 01/09/20      PT LONG TERM GOAL #2   Title Reduce FOTO to 55% to demo less limitation.    Status Achieved      PT LONG TERM GOAL #3   Title Pt will tolerate standing and walking tasks such as light housework and running errands x 20' with pain not to exceed 5/10.    Baseline reports 6/10 pain walking 13'    Status On-going      PT LONG TERM GOAL #4   Title Pt will be able to articulate at least two tools from HEP or behavioral modifications for pain management.    Status Achieved      PT LONG TERM GOAL #5   Title Pt will attend at least 1 aquatic PT session to learn ways to exercise in the pool to reduce load bearing strain while strengthening and building endurance.    Status Achieved      PT LONG TERM GOAL #6   Title Pt will be ind with advanced HEP and understand how to safely progress.    Time 6    Period Weeks    Status On-going    Target Date 01/09/20      PT LONG TERM GOAL #7   Title Be able to perform single leg stand for at least 3 sec bil LE for reduced risk of falls    Baseline <2 sec    Time 6    Period Weeks    Status New    Target Date 01/09/20                 Plan - 12/13/19 0849    Clinical Impression Statement PT and Pt discussed POC moving forward. Pt does not feel ready to taper appointment frequency due to lack of confidence on form and technique for her exercises.  Pt does need ongoing encouragement to fully engage muscles  during all ther ex especially around hip musculature.  PT spent extensive time cueing and demo'ing SL hip abduction for full benefit of the exercise.  PT also encouraged more glut ext with gait today.  Pt is limited somewhat by ongoing chronic knee pain which is being evaluated by MD.  She enjoys aquatic therapy and finds she needs ongoing cueing there too for better awareness of muscles.  Pt with fatigue end of session.  Continue along POC with ongoing cueing as needed for proper form/technique.    Comorbidities lumbar fusion revision L4/5 and new fusion L5/S1 April 2021, Lt TKR with poor outcome/pain, HTN, diabetes, asthma, osteopenia    Rehab Potential Good    PT Frequency 2x / week    PT Duration 6 weeks    PT Treatment/Interventions Aquatic Therapy;ADLs/Self Care Home Management;Cryotherapy;Electrical Stimulation;Moist Heat;Gait training;Stair training;Functional mobility training;Therapeutic exercise;Balance training;Neuromuscular re-education;Manual techniques;Patient/family education;Passive range  of motion;Dry needling;Spinal Manipulations;Joint Manipulations;Taping    PT Next Visit Plan Hip strength, SLS balance, core strength progression    PT Home Exercise Plan Access Code: [email or web address]    Consulted and Agree with Plan of Care Patient           Patient will benefit from skilled therapeutic intervention in order to improve the following deficits and impairments:     Visit Diagnosis: Chronic bilateral low back pain without sciatica  Muscle weakness (generalized)  Other abnormalities of gait and mobility  Chronic pain of left knee     Problem List Patient Active Problem List   Diagnosis Date Noted  . Carotid arterial disease ([place]) 01/23/2019  . RBBB (right bundle branch block with left anterior fascicular block)   . Chest pain 01/22/2019  . Weakness of both lower extremities 01/02/2019  . Frequent falls 01/02/2019  . OSA on CPAP 06/15/2017  . Diabetes ([place]) 03/02/2017  .  Herpes zoster without complication 67/05/1001  . B12 nutritional deficiency 02/23/2017  . Vitamin D deficiency 12/08/2016  . Tubular adenoma of colon 04/29/2016  . Essential hypertension 09/12/2015  . Allergic rhinitis 07/17/2015  . Insomnia 07/17/2015  . Obese 04/10/2015  . S/P left TKA 04/09/2015  . Osteopenia 12/04/2012  . 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 RESTLESS LEG SYNDROME 02/11/2009  . LEG EDEMA, BILATERAL 02/11/2009  . Hyperlipidemia 10/30/2008  . Urinary incontinence 10/30/2008  . Skin cancer 10/30/2008  . Asthma 11/21/2007  . Venous (peripheral) insufficiency 11/22/2006    Baruch Merl, PT 12/13/19 9:29 AM   [facility] [address] [place], [place], [address] Phone: [phone]   Fax:  [phone]  Name: TYLIYAH MCMEEKIN MRN: [id number] Date of Birth: 1940/07/26

## 2019-12-15 ENCOUNTER — Ambulatory Visit: Payer: Medicare PPO | Admitting: Physical Therapy

## 2019-12-15 ENCOUNTER — Encounter: Payer: Self-pay | Admitting: Physical Therapy

## 2019-12-15 ENCOUNTER — Other Ambulatory Visit: Payer: Self-pay

## 2019-12-15 DIAGNOSIS — G8929 Other chronic pain: Secondary | ICD-10-CM | POA: Diagnosis not present

## 2019-12-15 DIAGNOSIS — R262 Difficulty in walking, not elsewhere classified: Secondary | ICD-10-CM

## 2019-12-15 DIAGNOSIS — M25562 Pain in left knee: Secondary | ICD-10-CM | POA: Diagnosis not present

## 2019-12-15 DIAGNOSIS — R2689 Other abnormalities of gait and mobility: Secondary | ICD-10-CM | POA: Diagnosis not present

## 2019-12-15 DIAGNOSIS — M545 Low back pain, unspecified: Secondary | ICD-10-CM

## 2019-12-15 DIAGNOSIS — M6281 Muscle weakness (generalized): Secondary | ICD-10-CM

## 2019-12-15 DIAGNOSIS — M25551 Pain in right hip: Secondary | ICD-10-CM | POA: Diagnosis not present

## 2019-12-15 NOTE — Therapy (Signed)
[facility] [address], [place], [place], [address] Phone: [phone]   Fax:  [phone]  Physical Therapy Treatment  Patient Details  Name: Erin Roach MRN: [id number] Date of Birth: 10-Jan-1941 Referring Provider (PT): Berneda Rose, [place]   Encounter Date: 12/15/2019   PT End of Session - 12/15/19 1617    Visit Number 16    Date for PT Re-Evaluation 01/23/20    Authorization Type [employer]    Authorization Time Period Cohere approved 12 visits, 11/28/19-01/23/20    Authorization - Visit Number 16    Authorization - Number of Visits 24    Progress Note Due on Visit 20    PT Start Time 1410    PT Stop Time 1448    PT Time Calculation (min) 38 min    Activity Tolerance Patient tolerated treatment well    Behavior During Therapy [facility] for tasks assessed/performed           Past Medical History:  Diagnosis Date  . Allergic rhinitis   . Apnea   . Arthritis   . Asthma    very mild  . Back pain   . Bilateral lower extremity edema   . Carotid artery disease ([place])    a. Carotid duplex 03/2016 - duplex was stable, 97-35% RICA, 3-29% LICA  . Complication of anesthesia    post-op delirium  . Endometrial polyp   . Essential hypertension   . GERD (gastroesophageal reflux disease)   . Heart murmur    per pt  . History of adenomatous polyp of colon    04-24-2016  tubular adenoma  . History of squamous cell carcinoma excision    pilonidal area second excision 02-02-2002  . HTN (hypertension)   . Hyperlipidemia   . Leg pain   . OAB (overactive bladder)   . RA (rheumatoid arthritis) ([place])   . RBBB (right bundle branch block with left anterior fascicular block)   . RLS (restless legs syndrome)   . Shortness of breath on exertion   . SUI (stress urinary incontinence, female)   . Venous insufficiency    legs  . Vitamin D deficiency     Past Surgical History:  Procedure Laterality Date  . APPENDECTOMY   07-02-2005   [name]   open  . CARDIAC CATHETERIZATION  01-23-2009  dr Darnell Level brodie   normal coronary arteries and lvf  . CARDIOVASCULAR STRESS TEST  03/31/2016   Low risk nuclear study w/ medium defect of mild severity in basal anterior and mid anterior location w/ no evidence ischemia or infarction/  normal LV function and wall motion,  nuclear stress ef 71%  . CATARACT EXTRACTION W/ INTRAOCULAR LENS IMPLANT Left 09/2016  . COLONOSCOPY  last one 04-24-2016  . HYSTEROSCOPY WITH D & C N/A 12/22/2016   Procedure: DILATATION AND CURETTAGE /HYSTEROSCOPY;  Surgeon: Dian Queen, MD;  Location: [place];  Service: Gynecology;  Laterality: N/A;  . IR US GUIDANCE  09/08/2016  . POSTERIOR LUMBAR FUSION  02/ 2018    [facility] ([place])  . SKIN CANCER EXCISION    . TONSILLECTOMY  child  . 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 TRANSTHORACIC ECHOCARDIOGRAM  01/21/2009   mild LVH, ef 92-42%, grade 1 diastolic dysfunction  . ULNAR NERVE TRANSPOSITION Left 12-20-2008  dr sypher   decompression  and partial resection medial triceps fascia  . WIDE LOCAL EXCISION PILONIDAL AREA  02-02-2002  [name]   recurrent squamous cell carcinoma in situ    There were no vitals filed for this visit.   Subjective Assessment - 12/15/19 1616    Subjective My back is good, my knee feels worse.    Pertinent History "[place]";  left TKR;  lumbar fusion; restless legs, HTN; diabetes;  mild neuropathy;  osteopenia    Currently in Pain? Yes    Pain Score 5     Pain Location Knee    Pain Orientation Left    Pain Descriptors / Indicators Constant    Aggravating Factors  walking increases knee pain    Pain Relieving Factors ice, some exercise    Multiple Pain Sites No         Patient seen for aquatic therapy today.  Treatment took place in water  2.5-4 feet deep depending upon activity.  Pt entered the pool via 3 steps with use of bil rails, step to step secondary to knee pain.  Standing in mid chest/waist depth water pt performed water walking in all 4 directions 2 lengths of pool. VC for speed in order to generate appropriate current for resistance. Pt demonstrates increased speed in all for directions. Hip 3 ways 20x ea with min support of pool wall. VC to push/pull against water with a force appropriate. Core contractions in partial wall squat with blue noodle 5 sec hold 10x. Tandem stance with black UE water weights performing breast stroke motion 1 min x2.  White noodle knee extensions 10x bil, UE support required for balance.High knee marching with blue noodle 2 full lengths of pool. 3 min of water bicycle with blue noodle.   Pt required 2 short breaks for decompression floats to decompress low back but not for pain reduction, more prevention.                               PT Short Term Goals - 11/21/19 1420      PT SHORT TERM GOAL #1   Title Pt will be compliant and ind with initial HEP including walking 2-3 times a day for short duration as tol    Status Achieved      PT SHORT TERM GOAL #2   Title Pt will demo gait symmetry with improved step length and equal WB between bil LEs.    Status Achieved      PT SHORT TERM GOAL #3   Title Pt will be able to perform light household standing tasks x 5'    Status Achieved      PT SHORT TERM GOAL #4   Title Pt will report improved pain with daily household tasks by at least 20%    Status Achieved             PT Long Term Goals - 11/28/19 1720      PT LONG TERM GOAL #1   Title Pt will achieve at least 4+/5 for trunk and LE strength for improved tolerance of daily tasks.    Time 6    Period Weeks    Status On-going    Target Date 01/09/20      PT LONG TERM GOAL #2   Title Reduce FOTO to 55% to demo less limitation.    Status Achieved      PT  LONG TERM GOAL #3   Title Pt  will tolerate standing and walking tasks such as light housework and running errands x 20' with pain not to exceed 5/10.    Baseline reports 6/10 pain walking 13'    Status On-going      PT LONG TERM GOAL #4   Title Pt will be able to articulate at least two tools from HEP or behavioral modifications for pain management.    Status Achieved      PT LONG TERM GOAL #5   Title Pt will attend at least 1 aquatic PT session to learn ways to exercise in the pool to reduce load bearing strain while strengthening and building endurance.    Status Achieved      PT LONG TERM GOAL #6   Title Pt will be ind with advanced HEP and understand how to safely progress.    Time 6    Period Weeks    Status On-going    Target Date 01/09/20      PT LONG TERM GOAL #7   Title Be able to perform single leg stand for at least 3 sec bil LE for reduced risk of falls    Baseline <2 sec    Time 6    Period Weeks    Status New    Target Date 01/09/20                 Plan - 12/15/19 1619    Clinical Impression Statement Pt arrives for aquatic PT with no back pain but reports increasing pain in her Lt knee. Exercises in the pool did not increase knee pain. Pt demonstrated greater ease moving in the water today, increasing the speed of her movments and walking. Pt will have access to pool on vacation so we spent time discussing which exercises she could continue with.  Pt verbally understood what to do.    Personal Factors and Comorbidities Age;Past/Current Experience;Comorbidity 1;Comorbidity 2;Comorbidity 3+    Comorbidities lumbar fusion revision L4/5 and new fusion L5/S1 April 2021, Lt TKR with poor outcome/pain, HTN, diabetes, asthma, osteopenia    Examination-Activity Limitations Locomotion Level;Bend;Squat;Stairs;Lift    Examination-Participation Restrictions Cleaning;Community Activity;Shop;Laundry    Stability/Clinical Decision Making Stable/Uncomplicated    Rehab  Potential Good    PT Frequency 2x / week    PT Duration 6 weeks    PT Treatment/Interventions Aquatic Therapy;ADLs/Self Care Home Management;Cryotherapy;Electrical Stimulation;Moist Heat;Gait training;Stair training;Functional mobility training;Therapeutic exercise;Balance training;Neuromuscular re-education;Manual techniques;Patient/family education;Passive range of motion;Dry needling;Spinal Manipulations;Joint Manipulations;Taping    PT Next Visit Plan Hip strength, SLS balance, core strength progression    PT Home Exercise Plan Access Code: [email or web address]    Consulted and Agree with Plan of Care Patient           Patient will benefit from skilled therapeutic intervention in order to improve the following deficits and impairments:  Abnormal gait, Decreased range of motion, Difficulty walking, Increased fascial restricitons, Decreased strength, Postural dysfunction, Improper body mechanics, Impaired flexibility, Hypomobility, Decreased scar mobility, Pain, Decreased activity tolerance, Decreased endurance, Decreased mobility  Visit Diagnosis: Chronic bilateral low back pain without sciatica  Muscle weakness (generalized)  Other abnormalities of gait and mobility  Chronic pain of left knee  Pain in right hip  Difficulty in walking, not elsewhere classified     Problem List Patient Active Problem List   Diagnosis Date Noted  . Carotid arterial disease ([place]) 01/23/2019  . RBBB (right bundle branch block with left anterior fascicular block)   . Chest pain 01/22/2019  . Weakness of both  lower extremities 01/02/2019  . Frequent falls 01/02/2019  . OSA on CPAP 06/15/2017  . Diabetes ([place]) 03/02/2017  . Herpes zoster without complication 19/75/8832  . B12 nutritional deficiency 02/23/2017  . Vitamin D deficiency 12/08/2016  . Tubular adenoma of colon 04/29/2016  . Essential hypertension 09/12/2015  . Allergic rhinitis 07/17/2015  . Insomnia 07/17/2015  . Obese 04/10/2015  . S/P  left TKA 04/09/2015  . Osteopenia 12/04/2012  . 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 RESTLESS LEG SYNDROME 02/11/2009  . LEG EDEMA, BILATERAL 02/11/2009  . Hyperlipidemia 10/30/2008  . Urinary incontinence 10/30/2008  . Skin cancer 10/30/2008  . Asthma 11/21/2007  . Venous (peripheral) insufficiency 11/22/2006    Vrinda Heckstall, PTA 12/15/2019, 4:23 PM  [facility] [address] [place], [place], [address] Phone: [phone]   Fax:  [phone]  Name: Erin Roach MRN: [id number] Date of Birth: 08-Mar-1941

## 2019-12-20 MED ORDER — ROPINIROLE HCL 2 MG PO TABS: 2.0000 mg | ORAL_TABLET | Freq: Every day | ORAL | 2 refills | Status: DC

## 2019-12-20 NOTE — Telephone Encounter (Signed)
Okay to refill? 

## 2019-12-20 NOTE — Telephone Encounter (Signed)
Refill sent to preferred pharmacy. 

## 2019-12-20 NOTE — Telephone Encounter (Signed)
RA please advise- patient is requesting a refill on her Requip, and is asking for a 90 day supply.  Ok to fill?

## 2019-12-21 ENCOUNTER — Ambulatory Visit (HOSPITAL_COMMUNITY)
Admission: RE | Admit: 2019-12-21 | Discharge: 2019-12-21 | Disposition: A | Discharge status: Home / Self Care | Payer: Medicare PPO | Source: Ambulatory Visit | Attending: Orthopedic Surgery | Admitting: Orthopedic Surgery

## 2019-12-21 ENCOUNTER — Encounter (HOSPITAL_COMMUNITY)
Admission: RE | Admit: 2019-12-21 | Discharge: 2019-12-21 | Disposition: A | Discharge status: Home / Self Care | Payer: Medicare PPO | Source: Ambulatory Visit | Attending: Orthopedic Surgery | Admitting: Orthopedic Surgery

## 2019-12-21 ENCOUNTER — Other Ambulatory Visit: Payer: Self-pay

## 2019-12-21 DIAGNOSIS — M25562 Pain in left knee: Secondary | ICD-10-CM | POA: Insufficient documentation

## 2019-12-21 MED ORDER — TECHNETIUM TC 99M MEDRONATE IV KIT
21.6000 | PACK | Freq: Once | INTRAVENOUS | Status: AC | PRN
  Administered 2019-12-21: 21.6 via INTRAVENOUS

## 2019-12-22 ENCOUNTER — Encounter: Payer: Self-pay | Admitting: Physical Therapy

## 2019-12-22 ENCOUNTER — Ambulatory Visit: Payer: Medicare PPO | Admitting: Physical Therapy

## 2019-12-22 DIAGNOSIS — R262 Difficulty in walking, not elsewhere classified: Secondary | ICD-10-CM | POA: Diagnosis not present

## 2019-12-22 DIAGNOSIS — R2689 Other abnormalities of gait and mobility: Secondary | ICD-10-CM

## 2019-12-22 DIAGNOSIS — M6281 Muscle weakness (generalized): Secondary | ICD-10-CM

## 2019-12-22 DIAGNOSIS — M25562 Pain in left knee: Secondary | ICD-10-CM | POA: Diagnosis not present

## 2019-12-22 DIAGNOSIS — G8929 Other chronic pain: Secondary | ICD-10-CM

## 2019-12-22 DIAGNOSIS — M25551 Pain in right hip: Secondary | ICD-10-CM | POA: Diagnosis not present

## 2019-12-22 DIAGNOSIS — M545 Low back pain, unspecified: Secondary | ICD-10-CM

## 2019-12-22 NOTE — Therapy (Signed)
[facility] [address], [place], [place], [address] Phone: [phone]   Fax:  [phone]  Physical Therapy Treatment  Patient Details  Name: Erin Roach MRN: [id number] Date of Birth: [birth year] Referring Provider (PT): Berneda Rose, [place]   Encounter Date: 12/22/2019   PT End of Session - 12/22/19 1640    Visit Number 17    Date for PT Re-Evaluation 01/23/20    Authorization Type [employer]    Authorization Time Period Cohere approved 12 visits, 11/28/19-01/23/20    Authorization - Visit Number 58    Authorization - Number of Visits 24    Progress Note Due on Visit 20    PT Start Time 1345    PT Stop Time 1425    PT Time Calculation (min) 40 min    Activity Tolerance Patient tolerated treatment well    Behavior During Therapy [facility] for tasks assessed/performed           Past Medical History:  Diagnosis Date  . Allergic rhinitis   . Apnea   . Arthritis   . Asthma    very mild  . Back pain   . Bilateral lower extremity edema   . Carotid artery disease ([place])    a. Carotid duplex 03/2016 - duplex was stable, 75-64% RICA, 3-32% LICA  . Complication of anesthesia    post-op delirium  . Endometrial polyp   . Essential hypertension   . GERD (gastroesophageal reflux disease)   . Heart murmur    per pt  . History of adenomatous polyp of colon    04-24-2016  tubular adenoma  . History of squamous cell carcinoma excision    pilonidal area second excision 02-02-2002  . HTN (hypertension)   . Hyperlipidemia   . Leg pain   . OAB (overactive bladder)   . RA (rheumatoid arthritis) ([place])   . RBBB (right bundle branch block with left anterior fascicular block)   . RLS (restless legs syndrome)   . Shortness of breath on exertion   . SUI (stress urinary incontinence, female)   . Venous insufficiency    legs  . Vitamin D deficiency     Past Surgical History:  Procedure Laterality Date  . APPENDECTOMY   07-02-2005   [name]   open  . CARDIAC CATHETERIZATION  01-23-2009  dr Darnell Level brodie   normal coronary arteries and lvf  . CARDIOVASCULAR STRESS TEST  03/31/2016   Low risk nuclear study w/ medium defect of mild severity in basal anterior and mid anterior location w/ no evidence ischemia or infarction/  normal LV function and wall motion,  nuclear stress ef 71%  . CATARACT EXTRACTION W/ INTRAOCULAR LENS IMPLANT Left 09/2016  . COLONOSCOPY  last one 04-24-2016  . HYSTEROSCOPY WITH D & C N/A 12/22/2016   Procedure: DILATATION AND CURETTAGE /HYSTEROSCOPY;  Surgeon: Dian Queen, MD;  Location: [place];  Service: Gynecology;  Laterality: N/A;  . IR US GUIDANCE  09/08/2016  . POSTERIOR LUMBAR FUSION  02/ 2018    [facility] ([place])  . SKIN CANCER EXCISION    . TONSILLECTOMY  child  . 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 TRANSTHORACIC ECHOCARDIOGRAM  01/21/2009   mild LVH, ef 95-18%, grade 1 diastolic dysfunction  . ULNAR NERVE TRANSPOSITION Left 12-20-2008  dr sypher   decompression  and partial resection medial triceps fascia  . WIDE LOCAL EXCISION PILONIDAL AREA  02-02-2002  [name]   recurrent squamous cell carcinoma in situ    There were no vitals filed for this visit.   Subjective Assessment - 12/22/19 1638    Subjective My back is good, my knee feels worse. Had bone scan, awaiting results.    Pertinent History "[place]";  left TKR;  lumbar fusion; restless legs, HTN; diabetes;  mild neuropathy;  osteopenia    Currently in Pain? Yes    Pain Score 4     Pain Location Knee    Pain Orientation Left    Pain Descriptors / Indicators Sore    Aggravating Factors  Fairly constant    Pain Relieving Factors ice, some exercise, rest    Multiple Pain Sites No           Patient seen for aquatic therapy today.   Treatment took place in water 2.5-4 feet deep depending upon activity.  Pt entered the pool via steps with heavy use of hand rail, step to step. Seated water bench with 75% submersion Pt performed seated LE AROM exercises 20x in all planes,  Bil hamstring stretch 2x 15 sec Sit to stand 2x5  Standing in mid chest/waist depth water pt performed water walking in all 4 directions 2 lengths of pool. VC for speed in order to generate appropriate current for resistance. Hip 3 ways 20x ea with min support of pool wall. VC to push/pull against water with a force appropriate. Core contractions in partial wall squat with blue noodle 5 sec hold 10x. Bil heel raises 20x no UE support. White noodle knee extensions 10x RTLE, UE support required for balance.High knee marching with blue noodle 2 full lengths of pool.  Turquoise water UE weights 1 min each breast stroke arms in wider tandem stance.                           PT Short Term Goals - 11/21/19 1420      PT SHORT TERM GOAL #1   Title Pt will be compliant and ind with initial HEP including walking 2-3 times a day for short duration as tol    Status Achieved      PT SHORT TERM GOAL #2   Title Pt will demo gait symmetry with improved step length and equal WB between bil LEs.    Status Achieved      PT SHORT TERM GOAL #3   Title Pt will be able to perform light household standing tasks x 5'    Status Achieved      PT SHORT TERM GOAL #4   Title Pt will report improved pain with daily household tasks by at least 20%    Status Achieved             PT Long Term Goals - 11/28/19 1720      PT LONG TERM GOAL #1   Title Pt will achieve at least 4+/5 for trunk and LE strength for improved tolerance of daily tasks.    Time 6    Period Weeks    Status On-going    Target Date 01/09/20      PT LONG TERM GOAL #2   Title Reduce FOTO to 55% to demo less limitation.    Status Achieved      PT LONG TERM GOAL #3   Title Pt  will tolerate standing and walking  tasks such as light housework and running errands x 20' with pain not to exceed 5/10.    Baseline reports 6/10 pain walking 13'    Status On-going      PT LONG TERM GOAL #4   Title Pt will be able to articulate at least two tools from HEP or behavioral modifications for pain management.    Status Achieved      PT LONG TERM GOAL #5   Title Pt will attend at least 1 aquatic PT session to learn ways to exercise in the pool to reduce load bearing strain while strengthening and building endurance.    Status Achieved      PT LONG TERM GOAL #6   Title Pt will be ind with advanced HEP and understand how to safely progress.    Time 6    Period Weeks    Status On-going    Target Date 01/09/20      PT LONG TERM GOAL #7   Title Be able to perform single leg stand for at least 3 sec bil LE for reduced risk of falls    Baseline <2 sec    Time 6    Period Weeks    Status New    Target Date 01/09/20                 Plan - 12/22/19 1642    Clinical Impression Statement Pt reporting increasing LT knee pain. She has recently had a bone scan but it awaiting the results. Knee pain did not increase in the water today. Pt continues to in crease her speed of movement in the water and did not need cuing for Lt hip alignment. Pt required 1-2 decompression float rest breaks for fatigue of her back muscles.    Personal Factors and Comorbidities Age;Past/Current Experience;Comorbidity 1;Comorbidity 2;Comorbidity 3+    Comorbidities lumbar fusion revision L4/5 and new fusion L5/S1 April 2021, Lt TKR with poor outcome/pain, HTN, diabetes, asthma, osteopenia    Examination-Participation Restrictions Cleaning;Community Activity;Shop;Laundry    Stability/Clinical Decision Making Stable/Uncomplicated    Rehab Potential Good    PT Frequency 2x / week    PT Duration 6 weeks    PT Treatment/Interventions Aquatic Therapy;ADLs/Self Care Home Management;Cryotherapy;Electrical  Stimulation;Moist Heat;Gait training;Stair training;Functional mobility training;Therapeutic exercise;Balance training;Neuromuscular re-education;Manual techniques;Patient/family education;Passive range of motion;Dry needling;Spinal Manipulations;Joint Manipulations;Taping    PT Next Visit Plan Hip strength, SLS balance, core strength progression    PT Home Exercise Plan Access Code: [email or web address]    Consulted and Agree with Plan of Care Patient           Patient will benefit from skilled therapeutic intervention in order to improve the following deficits and impairments:  Abnormal gait, Decreased range of motion, Difficulty walking, Increased fascial restricitons, Decreased strength, Postural dysfunction, Improper body mechanics, Impaired flexibility, Hypomobility, Decreased scar mobility, Pain, Decreased activity tolerance, Decreased endurance, Decreased mobility  Visit Diagnosis: Muscle weakness (generalized)  Chronic bilateral low back pain without sciatica  Chronic pain of left knee  Other abnormalities of gait and mobility  Pain in right hip  Difficulty in walking, not elsewhere classified     Problem List Patient Active Problem List   Diagnosis Date Noted  . Carotid arterial disease ([place]) 01/23/2019  . RBBB (right bundle branch block with left anterior fascicular block)   . Chest pain 01/22/2019  . Weakness of both lower extremities 01/02/2019  . Frequent falls 01/02/2019  . OSA on CPAP 06/15/2017  . Diabetes ([place]) 03/02/2017  .  Herpes zoster without complication 56/31/4970  . B12 nutritional deficiency 02/23/2017  . Vitamin D deficiency 12/08/2016  . Tubular adenoma of colon 04/29/2016  . Essential hypertension 09/12/2015  . Allergic rhinitis 07/17/2015  . Insomnia 07/17/2015  . Obese 04/10/2015  . S/P left TKA 04/09/2015  . Osteopenia 12/04/2012  . 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 RESTLESS LEG SYNDROME 02/11/2009  . LEG EDEMA, BILATERAL 02/11/2009  . Hyperlipidemia  10/30/2008  . Urinary incontinence 10/30/2008  . Skin cancer 10/30/2008  . Asthma 11/21/2007  . Venous (peripheral) insufficiency 11/22/2006    Khallid Pasillas, PTA 12/22/2019, 4:45 PM  [facility] [address] [place], [place], [address] Phone: [phone]   Fax:  [phone]  Name: SHEROL SABAS MRN: [id number] Date of Birth: [birth year]

## 2019-12-27 ENCOUNTER — Encounter: Payer: Medicare PPO | Admitting: Physical Therapy

## 2019-12-29 ENCOUNTER — Encounter: Payer: Medicare PPO | Admitting: Physical Therapy

## 2019-12-31 ENCOUNTER — Encounter: Payer: Self-pay | Admitting: Internal Medicine

## 2019-12-31 DIAGNOSIS — M545 Low back pain, unspecified: Secondary | ICD-10-CM

## 2019-12-31 MED ORDER — HYDROCODONE-ACETAMINOPHEN 5-325 MG PO TABS: 1.0000 | ORAL_TABLET | Freq: Every day | ORAL | 0 refills | Status: DC

## 2020-01-01 ENCOUNTER — Ambulatory Visit: Payer: Medicare PPO | Admitting: Pulmonary Disease

## 2020-01-01 NOTE — Telephone Encounter (Signed)
Dr. Elsworth Soho, please advise on pt email, thanks!  Dr. Elsworth Soho, I did see my vitamin D test result, and know it was in the normal range, but wondered  how it related to the iron requirement for someone with rls.  Also wondered about my magnesium requirement. Thanks so much  [employer]

## 2020-01-02 ENCOUNTER — Encounter: Payer: Self-pay | Admitting: Internal Medicine

## 2020-01-03 ENCOUNTER — Encounter: Payer: Medicare PPO | Admitting: Physical Therapy

## 2020-01-03 NOTE — Telephone Encounter (Signed)
No relation between vitamin D or magnesium deficiency and restless leg syndrome as far as I know. However this deficiency can certainly affect people in other ways

## 2020-01-04 NOTE — Telephone Encounter (Signed)
Per [employer] the strips has been approved from 05/12/19-05/10/20.

## 2020-01-04 NOTE — Telephone Encounter (Signed)
Forms have been completed and faxed back to [facility].

## 2020-01-05 ENCOUNTER — Encounter: Payer: Medicare PPO | Admitting: Physical Therapy

## 2020-01-05 ENCOUNTER — Other Ambulatory Visit (INDEPENDENT_AMBULATORY_CARE_PROVIDER_SITE_OTHER): Payer: Self-pay | Admitting: Family Medicine

## 2020-01-05 DIAGNOSIS — E119 Type 2 diabetes mellitus without complications: Secondary | ICD-10-CM

## 2020-01-09 ENCOUNTER — Encounter: Payer: Self-pay | Admitting: Physical Therapy

## 2020-01-09 ENCOUNTER — Other Ambulatory Visit: Payer: Self-pay

## 2020-01-09 ENCOUNTER — Ambulatory Visit: Payer: Medicare PPO | Admitting: Physical Therapy

## 2020-01-09 DIAGNOSIS — M25562 Pain in left knee: Secondary | ICD-10-CM | POA: Diagnosis not present

## 2020-01-09 DIAGNOSIS — R2689 Other abnormalities of gait and mobility: Secondary | ICD-10-CM | POA: Diagnosis not present

## 2020-01-09 DIAGNOSIS — M25551 Pain in right hip: Secondary | ICD-10-CM | POA: Diagnosis not present

## 2020-01-09 DIAGNOSIS — M6281 Muscle weakness (generalized): Secondary | ICD-10-CM

## 2020-01-09 DIAGNOSIS — M545 Low back pain, unspecified: Secondary | ICD-10-CM

## 2020-01-09 DIAGNOSIS — G8929 Other chronic pain: Secondary | ICD-10-CM | POA: Diagnosis not present

## 2020-01-09 DIAGNOSIS — R262 Difficulty in walking, not elsewhere classified: Secondary | ICD-10-CM | POA: Diagnosis not present

## 2020-01-09 NOTE — Therapy (Signed)
[facility] [address] [place], [place], [address] Phone: [phone]   Fax:  [phone]  Physical Therapy Treatment  Patient Details  Name: Erin Roach MRN: [id number] Date of Birth: 08-16-1940 Referring Provider (PT): Berneda Rose, [place]   Encounter Date: 01/09/2020   PT End of Session - 01/09/20 1354    Visit Number 18    Date for PT Re-Evaluation 01/23/20    Authorization Type [employer]    Authorization Time Period Cohere approved 12 visits, 11/28/19-01/23/20    Authorization - Visit Number 18    Authorization - Number of Visits 24    Progress Note Due on Visit 20    PT Start Time 1234    PT Stop Time 1315    PT Time Calculation (min) 41 min    Activity Tolerance Patient tolerated treatment well    Behavior During Therapy [facility] for tasks assessed/performed           Past Medical History:  Diagnosis Date  . Allergic rhinitis   . Apnea   . Arthritis   . Asthma    very mild  . Back pain   . Bilateral lower extremity edema   . Carotid artery disease ([place])    a. Carotid duplex 03/2016 - duplex was stable, 98-33% RICA, 8-25% LICA  . Complication of anesthesia    post-op delirium  . Endometrial polyp   . Essential hypertension   . GERD (gastroesophageal reflux disease)   . Heart murmur    per pt  . History of adenomatous polyp of colon    04-24-2016  tubular adenoma  . History of squamous cell carcinoma excision    pilonidal area second excision 02-02-2002  . HTN (hypertension)   . Hyperlipidemia   . Leg pain   . OAB (overactive bladder)   . RA (rheumatoid arthritis) ([place])   . RBBB (right bundle branch block with left anterior fascicular block)   . RLS (restless legs syndrome)   . Shortness of breath on exertion   . SUI (stress urinary incontinence, female)   . Venous insufficiency    legs  . Vitamin D deficiency     Past Surgical History:  Procedure Laterality Date  . APPENDECTOMY   07-02-2005   [name]   open  . CARDIAC CATHETERIZATION  01-23-2009  dr Darnell Level brodie   normal coronary arteries and lvf  . CARDIOVASCULAR STRESS TEST  03/31/2016   Low risk nuclear study w/ medium defect of mild severity in basal anterior and mid anterior location w/ no evidence ischemia or infarction/  normal LV function and wall motion,  nuclear stress ef 71%  . CATARACT EXTRACTION W/ INTRAOCULAR LENS IMPLANT Left 09/2016  . COLONOSCOPY  last one 04-24-2016  . HYSTEROSCOPY WITH D & C N/A 12/22/2016   Procedure: DILATATION AND CURETTAGE /HYSTEROSCOPY;  Surgeon: Dian Queen, MD;  Location: [place];  Service: Gynecology;  Laterality: N/A;  . IR US GUIDANCE  09/08/2016  . POSTERIOR LUMBAR FUSION  02/ 2018    [facility] ([place], [place])  . SKIN CANCER EXCISION    . TONSILLECTOMY  child  . 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 TRANSTHORACIC ECHOCARDIOGRAM  01/21/2009   mild LVH, ef 05-39%, grade 1 diastolic dysfunction  . ULNAR NERVE TRANSPOSITION Left 12-20-2008  dr sypher   decompression  and partial resection medial triceps fascia  . WIDE LOCAL EXCISION PILONIDAL AREA  02-02-2002  [name]   recurrent squamous cell carcinoma in situ    There were no vitals filed for this visit.   Subjective Assessment - 01/09/20 1238    Subjective Pt states her back is usually pretty good. Her hips have been bothering her when walking.    Pertinent History "[place]";  left TKR;  lumbar fusion; restless legs, HTN; diabetes;  mild neuropathy;  osteopenia    Currently in Pain? No/denies                             [facility] Adult PT Treatment/Exercise - 01/09/20 0001      Lumbar Exercises: Aerobic   Nustep L4 intervals x7 min PT present to discuss progress       Lumbar Exercises: Standing   Row Strengthening;Both;10  reps    Theraband Level (Row) Level 4 (Blue)    Row Limitations x2 sets      Lumbar Exercises: Supine   Straight Leg Raise 10 reps    Straight Leg Raises Limitations PT cued TrA indrawing vs doming and bracing    Other Supine Lumbar Exercises abdominal bracing: BLE knees to 90/90 from propped position x10 reps       Knee/Hip Exercises: Standing   Forward Step Up 2 sets;Both;10 reps;Hand Hold: 2;Hand Hold: 1;Step Height: 6"    Forward Step Up Limitations 2nd set with 1 UE    Other Standing Knee Exercises hamstring curls #2 2x10 reps     Other Standing Knee Exercises hip abduction red TB around knees back at an angle 2x10 reps       Knee/Hip Exercises: Sidelying   Hip ABduction Strengthening;Right;Left;2 sets;10 reps    Hip ABduction Limitations minimal hip hiking noted with this                   PT Education - 01/09/20 1354    Education Details technique with therex    Person(s) Educated Patient    Methods Explanation;Verbal cues    Comprehension Verbalized understanding;Returned demonstration            PT Short Term Goals - 11/21/19 1420      PT SHORT TERM GOAL #1   Title Pt will be compliant and ind with initial HEP including walking 2-3 times a day for short duration as tol    Status Achieved      PT SHORT TERM GOAL #2   Title Pt will demo gait symmetry with improved step length and equal WB between bil LEs.    Status Achieved      PT SHORT TERM GOAL #3   Title Pt will be able to perform light household standing tasks x 5'    Status Achieved      PT SHORT TERM GOAL #4   Title Pt will report improved pain with daily household tasks by at least 20%    Status Achieved             PT Long Term Goals - 11/28/19 1720      PT LONG TERM GOAL #1   Title Pt will achieve at least 4+/5 for trunk and LE strength for improved tolerance of daily tasks.    Time 6    Period Weeks    Status On-going    Target Date 01/09/20      PT LONG TERM GOAL #  2   Title  Reduce FOTO to 55% to demo less limitation.    Status Achieved      PT LONG TERM GOAL #3   Title Pt will tolerate standing and walking tasks such as light housework and running errands x 20' with pain not to exceed 5/10.    Baseline reports 6/10 pain walking 13'    Status On-going      PT LONG TERM GOAL #4   Title Pt will be able to articulate at least two tools from HEP or behavioral modifications for pain management.    Status Achieved      PT LONG TERM GOAL #5   Title Pt will attend at least 1 aquatic PT session to learn ways to exercise in the pool to reduce load bearing strain while strengthening and building endurance.    Status Achieved      PT LONG TERM GOAL #6   Title Pt will be ind with advanced HEP and understand how to safely progress.    Time 6    Period Weeks    Status On-going    Target Date 01/09/20      PT LONG TERM GOAL #7   Title Be able to perform single leg stand for at least 3 sec bil LE for reduced risk of falls    Baseline <2 sec    Time 6    Period Weeks    Status New    Target Date 01/09/20                 Plan - 01/09/20 1359    Clinical Impression Statement Pt has been doing well with her HEP adherence over the past several weeks and denies any back pain upon arrival. Session continued with focus on increasing hip and trunk strength, and pt was able to complete all exercises with proper technique and mild PT cuing. Pt's HEP was updated to promote glute strength and she demonstrated good understanding of this.    Personal Factors and Comorbidities Age;Past/Current Experience;Comorbidity 1;Comorbidity 2;Comorbidity 3+    Comorbidities lumbar fusion revision L4/5 and new fusion L5/S1 April 2021, Lt TKR with poor outcome/pain, HTN, diabetes, asthma, osteopenia    Examination-Participation Restrictions Cleaning;Community Activity;Shop;Laundry    Stability/Clinical Decision Making Stable/Uncomplicated    Rehab Potential Good    PT Frequency 2x /  week    PT Duration 6 weeks    PT Treatment/Interventions Aquatic Therapy;ADLs/Self Care Home Management;Cryotherapy;Electrical Stimulation;Moist Heat;Gait training;Stair training;Functional mobility training;Therapeutic exercise;Balance training;Neuromuscular re-education;Manual techniques;Patient/family education;Passive range of motion;Dry needling;Spinal Manipulations;Joint Manipulations;Taping    PT Next Visit Plan Hip strength, SLS balance, core strength progression    PT Home Exercise Plan Access Code: [email or web address]    Consulted and Agree with Plan of Care Patient           Patient will benefit from skilled therapeutic intervention in order to improve the following deficits and impairments:  Abnormal gait, Decreased range of motion, Difficulty walking, Increased fascial restricitons, Decreased strength, Postural dysfunction, Improper body mechanics, Impaired flexibility, Hypomobility, Decreased scar mobility, Pain, Decreased activity tolerance, Decreased endurance, Decreased mobility  Visit Diagnosis: Muscle weakness (generalized)  Chronic bilateral low back pain without sciatica  Chronic pain of left knee     Problem List Patient Active Problem List   Diagnosis Date Noted  . Carotid arterial disease ([place]) 01/23/2019  . RBBB (right bundle branch block with left anterior fascicular block)   . Chest pain 01/22/2019  . Weakness of both lower extremities  01/02/2019  . Frequent falls 01/02/2019  . OSA on CPAP 06/15/2017  . Diabetes ([place]) 03/02/2017  . Herpes zoster without complication 46/50/3546  . B12 nutritional deficiency 02/23/2017  . Vitamin D deficiency 12/08/2016  . Tubular adenoma of colon 04/29/2016  . Essential hypertension 09/12/2015  . Allergic rhinitis 07/17/2015  . Insomnia 07/17/2015  . Obese 04/10/2015  . S/P left TKA 04/09/2015  . Osteopenia 12/04/2012  . 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 RESTLESS LEG SYNDROME 02/11/2009  . LEG EDEMA, BILATERAL 02/11/2009  .  Hyperlipidemia 10/30/2008  . Urinary incontinence 10/30/2008  . Skin cancer 10/30/2008  . Asthma 11/21/2007  . Venous (peripheral) insufficiency 11/22/2006    2:06 PM,01/09/20 Sherol Dade PT, DPT [place] [facility] [address] [place], [place], [address] Phone: [phone]   Fax:  [phone]  Name: Erin Roach MRN: [id number] Date of Birth: 1940-07-18

## 2020-01-12 ENCOUNTER — Ambulatory Visit: Payer: Medicare PPO | Attending: Physician Assistant | Admitting: Physical Therapy

## 2020-01-12 ENCOUNTER — Encounter: Payer: Self-pay | Admitting: Physical Therapy

## 2020-01-12 ENCOUNTER — Other Ambulatory Visit: Payer: Self-pay

## 2020-01-12 DIAGNOSIS — M545 Low back pain, unspecified: Secondary | ICD-10-CM

## 2020-01-12 DIAGNOSIS — M25551 Pain in right hip: Secondary | ICD-10-CM

## 2020-01-12 DIAGNOSIS — M6281 Muscle weakness (generalized): Secondary | ICD-10-CM | POA: Insufficient documentation

## 2020-01-12 DIAGNOSIS — G8929 Other chronic pain: Secondary | ICD-10-CM | POA: Insufficient documentation

## 2020-01-12 DIAGNOSIS — R262 Difficulty in walking, not elsewhere classified: Secondary | ICD-10-CM | POA: Insufficient documentation

## 2020-01-12 DIAGNOSIS — M25562 Pain in left knee: Secondary | ICD-10-CM | POA: Diagnosis not present

## 2020-01-12 DIAGNOSIS — R2689 Other abnormalities of gait and mobility: Secondary | ICD-10-CM | POA: Insufficient documentation

## 2020-01-12 NOTE — Therapy (Signed)
[facility] [address], [place], [place], [address] Phone: [phone]   Fax:  [phone]  Physical Therapy Treatment  Patient Details  Name: Erin Roach MRN: [id number] Date of Birth: 1941/01/21 Referring Provider (PT): Berneda Rose, [place]   Encounter Date: 01/12/2020   PT End of Session - 01/12/20 1658    Visit Number 19    Date for PT Re-Evaluation 01/23/20    Authorization Type [employer]    Authorization Time Period Cohere approved 12 visits, 11/28/19-01/23/20    Authorization - Visit Number 18    Authorization - Number of Visits 24    Progress Note Due on Visit 20    PT Start Time 1215    PT Stop Time 1300    PT Time Calculation (min) 45 min    Activity Tolerance Patient tolerated treatment well           Past Medical History:  Diagnosis Date  . Allergic rhinitis   . Apnea   . Arthritis   . Asthma    very mild  . Back pain   . Bilateral lower extremity edema   . Carotid artery disease ([place])    a. Carotid duplex 03/2016 - duplex was stable, 28-31% RICA, 5-17% LICA  . Complication of anesthesia    post-op delirium  . Endometrial polyp   . Essential hypertension   . GERD (gastroesophageal reflux disease)   . Heart murmur    per pt  . History of adenomatous polyp of colon    04-24-2016  tubular adenoma  . History of squamous cell carcinoma excision    pilonidal area second excision 02-02-2002  . HTN (hypertension)   . Hyperlipidemia   . Leg pain   . OAB (overactive bladder)   . RA (rheumatoid arthritis) ([place])   . RBBB (right bundle branch block with left anterior fascicular block)   . RLS (restless legs syndrome)   . Shortness of breath on exertion   . SUI (stress urinary incontinence, female)   . Venous insufficiency    legs  . Vitamin D deficiency     Past Surgical History:  Procedure Laterality Date  . APPENDECTOMY  07-02-2005   [name]   open  . CARDIAC  CATHETERIZATION  01-23-2009  dr Darnell Level brodie   normal coronary arteries and lvf  . CARDIOVASCULAR STRESS TEST  03/31/2016   Low risk nuclear study w/ medium defect of mild severity in basal anterior and mid anterior location w/ no evidence ischemia or infarction/  normal LV function and wall motion,  nuclear stress ef 71%  . CATARACT EXTRACTION W/ INTRAOCULAR LENS IMPLANT Left 09/2016  . COLONOSCOPY  last one 04-24-2016  . HYSTEROSCOPY WITH D & C N/A 12/22/2016   Procedure: DILATATION AND CURETTAGE /HYSTEROSCOPY;  Surgeon: Dian Queen, MD;  Location: [place];  Service: Gynecology;  Laterality: N/A;  . IR US GUIDANCE  09/08/2016  . POSTERIOR LUMBAR FUSION  02/ 2018    [facility] ([place], [place])  . SKIN CANCER EXCISION    . TONSILLECTOMY  child  . 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 TRANSTHORACIC ECHOCARDIOGRAM  01/21/2009   mild LVH, ef 61-60%, grade 1 diastolic dysfunction  . ULNAR NERVE TRANSPOSITION Left 12-20-2008  dr sypher   decompression and partial resection medial triceps fascia  . WIDE LOCAL  EXCISION PILONIDAL AREA  02-02-2002  [name]   recurrent squamous cell carcinoma in situ    There were no vitals filed for this visit.   Subjective Assessment - 01/12/20 1657    Subjective Pt was able to exercise in the pool on vacation and reports high compliance, felt good.    Pertinent History "[place]";  left TKR;  lumbar fusion; restless legs, HTN; diabetes;  mild neuropathy;  osteopenia    Currently in Pain? No/denies   Intermitent Lt knee pain   Multiple Pain Sites No            Treatment: Patient seen for aquatic therapy today.  Treatment took place in water 2.5-4 feet deep depending upon activity.  Pt entered the pool via 3 steps with heavy use of hand rails. Water temp 87.6 degrees F. Standing in mid  chest/waist depth water pt performed water walking in all 4 directions 2 lengths of pool. VC for speed in order to generate appropriate current for resistance. Hip 3 ways 20x ea with min support of pool wall. VC to push/pull against water with a force appropriate. Core contractions in partial wall squat with blue noodle 5 sec hold 10x. Bil heel raises 20x no UE support. White noodle knee extensions 10x bil, UE support required for balance.High knee marching with blue noodle 1 full length of pool.  2-3 short bouts of decompressive floats with noodle for LE traction.  UE black weights for 2x 1 min breast stroke arms Bil gastroc stretch 2x 20 sec                           PT Short Term Goals - 11/21/19 1420      PT SHORT TERM GOAL #1   Title Pt will be compliant and ind with initial HEP including walking 2-3 times a day for short duration as tol    Status Achieved      PT SHORT TERM GOAL #2   Title Pt will demo gait symmetry with improved step length and equal WB between bil LEs.    Status Achieved      PT SHORT TERM GOAL #3   Title Pt will be able to perform light household standing tasks x 5'    Status Achieved      PT SHORT TERM GOAL #4   Title Pt will report improved pain with daily household tasks by at least 20%    Status Achieved             PT Long Term Goals - 11/28/19 1720      PT LONG TERM GOAL #1   Title Pt will achieve at least 4+/5 for trunk and LE strength for improved tolerance of daily tasks.    Time 6    Period Weeks    Status On-going    Target Date 01/09/20      PT LONG TERM GOAL #2   Title Reduce FOTO to 55% to demo less limitation.    Status Achieved      PT LONG TERM GOAL #3   Title Pt will tolerate standing and walking tasks such as light housework and running errands x 20' with pain not to exceed 5/10.    Baseline reports 6/10 pain walking 13'    Status On-going      PT LONG TERM GOAL #4   Title Pt will be able to  articulate at least two tools from HEP or  behavioral modifications for pain management.    Status Achieved      PT LONG TERM GOAL #5   Title Pt will attend at least 1 aquatic PT session to learn ways to exercise in the pool to reduce load bearing strain while strengthening and building endurance.    Status Achieved      PT LONG TERM GOAL #6   Title Pt will be ind with advanced HEP and understand how to safely progress.    Time 6    Period Weeks    Status On-going    Target Date 01/09/20      PT LONG TERM GOAL #7   Title Be able to perform single leg stand for at least 3 sec bil LE for reduced risk of falls    Baseline <2 sec    Time 6    Period Weeks    Status New    Target Date 01/09/20                 Plan - 01/12/20 1659    Clinical Impression Statement Pt arrives to aquatic therapy today just off a few weeks vacation. She reports her compliance with HEP has never been higher as she had gotten into the pool where they were vacationing every day. She reports feeling great exercising in the water and is strongly looking into joining either the [facility] pool or [facility]. Pt doubled her walking distance as well as speed today.    Personal Factors and Comorbidities Age;Past/Current Experience;Comorbidity 1;Comorbidity 2;Comorbidity 3+    Comorbidities lumbar fusion revision L4/5 and new fusion L5/S1 April 2021, Lt TKR with poor outcome/pain, HTN, diabetes, asthma, osteopenia    Examination-Activity Limitations Locomotion Level;Bend;Squat;Stairs;Lift    Examination-Participation Restrictions Cleaning;Community Activity;Shop;Laundry    Stability/Clinical Decision Making Stable/Uncomplicated    Rehab Potential Good    PT Duration 6 weeks    PT Treatment/Interventions Aquatic Therapy;ADLs/Self Care Home Management;Cryotherapy;Electrical Stimulation;Moist Heat;Gait training;Stair training;Functional mobility training;Therapeutic exercise;Balance training;Neuromuscular re-education;Manual  techniques;Patient/family education;Passive range of motion;Dry needling;Spinal Manipulations;Joint Manipulations;Taping    PT Next Visit Plan Hip strength, SLS balance, core strength progression    PT Home Exercise Plan Access Code: [email or web address]    Consulted and Agree with Plan of Care Patient           Patient will benefit from skilled therapeutic intervention in order to improve the following deficits and impairments:  Abnormal gait, Decreased range of motion, Difficulty walking, Increased fascial restricitons, Decreased strength, Postural dysfunction, Improper body mechanics, Impaired flexibility, Hypomobility, Decreased scar mobility, Pain, Decreased activity tolerance, Decreased endurance, Decreased mobility  Visit Diagnosis: Muscle weakness (generalized)  Chronic bilateral low back pain without sciatica  Chronic pain of left knee  Other abnormalities of gait and mobility  Pain in right hip  Difficulty in walking, not elsewhere classified     Problem List Patient Active Problem List   Diagnosis Date Noted  . Carotid arterial disease ([place]) 01/23/2019  . RBBB (right bundle branch block with left anterior fascicular block)   . Chest pain 01/22/2019  . Weakness of both lower extremities 01/02/2019  . Frequent falls 01/02/2019  . OSA on CPAP 06/15/2017  . Diabetes ([place]) 03/02/2017  . Herpes zoster without complication 85/46/2703  . B12 nutritional deficiency 02/23/2017  . Vitamin D deficiency 12/08/2016  . Tubular adenoma of colon 04/29/2016  . Essential hypertension 09/12/2015  . Allergic rhinitis 07/17/2015  . Insomnia 07/17/2015  . Obese 04/10/2015  . S/P left TKA 04/09/2015  . Osteopenia 12/04/2012  .  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 RESTLESS LEG SYNDROME 02/11/2009  . LEG EDEMA, BILATERAL 02/11/2009  . Hyperlipidemia 10/30/2008  . Urinary incontinence 10/30/2008  . Skin cancer 10/30/2008  . Asthma 11/21/2007  . Venous (peripheral) insufficiency 11/22/2006     Camar Guyton, PTA 01/12/2020, 5:01 PM  [facility] [address] [place], [place], [address] Phone: [phone]   Fax:  [phone]  Name: TRANESHA LESSNER MRN: [id number] Date of Birth: 1941-04-24

## 2020-01-14 ENCOUNTER — Other Ambulatory Visit: Payer: Self-pay | Admitting: Internal Medicine

## 2020-01-14 DIAGNOSIS — E559 Vitamin D deficiency, unspecified: Secondary | ICD-10-CM

## 2020-01-16 ENCOUNTER — Encounter: Payer: Self-pay | Admitting: Internal Medicine

## 2020-01-16 DIAGNOSIS — E119 Type 2 diabetes mellitus without complications: Secondary | ICD-10-CM

## 2020-01-16 MED ORDER — VICTOZA 18 MG/3ML ~~LOC~~ SOPN: PEN_INJECTOR | SUBCUTANEOUS | 0 refills | Status: DC

## 2020-01-17 ENCOUNTER — Ambulatory Visit: Payer: Medicare PPO | Admitting: Physical Therapy

## 2020-01-17 ENCOUNTER — Other Ambulatory Visit: Payer: Self-pay

## 2020-01-17 ENCOUNTER — Encounter: Payer: Self-pay | Admitting: Physical Therapy

## 2020-01-17 DIAGNOSIS — R2689 Other abnormalities of gait and mobility: Secondary | ICD-10-CM | POA: Diagnosis not present

## 2020-01-17 DIAGNOSIS — M25562 Pain in left knee: Secondary | ICD-10-CM | POA: Diagnosis not present

## 2020-01-17 DIAGNOSIS — M545 Low back pain, unspecified: Secondary | ICD-10-CM

## 2020-01-17 DIAGNOSIS — G8929 Other chronic pain: Secondary | ICD-10-CM | POA: Diagnosis not present

## 2020-01-17 DIAGNOSIS — M6281 Muscle weakness (generalized): Secondary | ICD-10-CM

## 2020-01-17 DIAGNOSIS — M25551 Pain in right hip: Secondary | ICD-10-CM | POA: Diagnosis not present

## 2020-01-17 DIAGNOSIS — R262 Difficulty in walking, not elsewhere classified: Secondary | ICD-10-CM | POA: Diagnosis not present

## 2020-01-18 NOTE — Therapy (Signed)
[facility] [address] [place], [place], [address] Phone: [phone]   Fax:  [phone]  Physical Therapy Treatment  Patient Details  Name: Erin Roach MRN: [id number] Date of Birth: 1941-02-21 Referring Provider (PT): Berneda Rose, [place]  Progress Note Reporting Period 11/28/19 to 01/17/20  See note below for Objective Data and Assessment of Progress/Goals.      Encounter Date: 01/17/2020   PT End of Session - 01/17/20 1317    Visit Number 20    Date for PT Re-Evaluation 01/23/20    Authorization Type [employer]    Authorization Time Period [place]here approved 12 visits, 11/28/19-01/23/20    Authorization - Visit Number 62    Authorization - Number of Visits 24    Progress Note Due on Visit 20    PT Start Time 6440    PT Stop Time 1315    PT Time Calculation (min) 44 min    Activity Tolerance Patient tolerated treatment well    Behavior During Therapy WFL for tasks assessed/performed           Past Medical History:  Diagnosis Date  . Allergic rhinitis   . Apnea   . Arthritis   . Asthma    very mild  . Back pain   . Bilateral lower extremity edema   . Carotid artery disease ([place])    a. Carotid duplex 03/2016 - duplex was stable, 34-74% RICA, 2-59% LICA  . Complication of anesthesia    post-op delirium  . Endometrial polyp   . Essential hypertension   . GERD (gastroesophageal reflux disease)   . Heart murmur    per pt  . History of adenomatous polyp of colon    04-24-2016  tubular adenoma  . History of squamous cell carcinoma excision    pilonidal area second excision 02-02-2002  . HTN (hypertension)   . Hyperlipidemia   . Leg pain   . OAB (overactive bladder)   . RA (rheumatoid arthritis) ([place])   . RBBB (right bundle branch block with left anterior fascicular block)   . RLS (restless legs syndrome)   . Shortness of breath on exertion   . SUI (stress urinary incontinence, female)   .  Venous insufficiency    legs  . Vitamin D deficiency     Past Surgical History:  Procedure Laterality Date  . APPENDECTOMY  07-02-2005   [name]   open  . CARDIAC CATHETERIZATION  01-23-2009  dr Darnell Level brodie   normal coronary arteries and lvf  . CARDIOVASCULAR STRESS TEST  03/31/2016   Low risk nuclear study w/ medium defect of mild severity in basal anterior and mid anterior location w/ no evidence ischemia or infarction/  normal LV function and wall motion,  nuclear stress ef 71%  . CATARACT EXTRACTION W/ INTRAOCULAR LENS IMPLANT Left 09/2016  . COLONOSCOPY  last one 04-24-2016  . HYSTEROSCOPY WITH D & C N/A 12/22/2016   Procedure: DILATATION AND CURETTAGE /HYSTEROSCOPY;  Surgeon: Dian Queen, MD;  Location: [place];  Service: Gynecology;  Laterality: N/A;  . IR US GUIDANCE  09/08/2016  . POSTERIOR LUMBAR FUSION  02/ 2018    [facility] ([place], [place])  . SKIN CANCER EXCISION    . TONSILLECTOMY  child  . 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 TRANSTHORACIC ECHOCARDIOGRAM  01/21/2009  mild LVH, ef 85-88%, grade 1 diastolic dysfunction  . ULNAR NERVE TRANSPOSITION Left 12-20-2008  dr sypher   decompression and partial resection medial triceps fascia  . WIDE LOCAL EXCISION PILONIDAL AREA  02-02-2002  [name]   recurrent squamous cell carcinoma in situ    There were no vitals filed for this visit.   Subjective Assessment - 01/17/20 1233    Subjective Pt has no complaints at this time.    Pertinent History "[place]";  left TKR;  lumbar fusion; restless legs, HTN; diabetes;  mild neuropathy;  osteopenia    Currently in Pain? No/denies                             [facility] Adult PT Treatment/Exercise - 01/18/20 0001      Lumbar Exercises: Stretches   Other Lumbar Stretch Exercise hip flexor stretch  at steps x30 sec VC to tightnes abdominals and glutes       Lumbar Exercises: Aerobic   Nustep L4 x8 min PT present to discuss aquatic exercise following d/c       Knee/Hip Exercises: Stretches   Passive Hamstring Stretch Left;Right;3 reps;30 seconds    Passive Hamstring Stretch Limitations LE on steps       Knee/Hip Exercises: Standing   Forward Step Up 2 sets;10 reps    Forward Step Up Limitations 2nd set pt encouraged to use 1 UE support only     Other Standing Knee Exercises hip extension yellow TB around ankles 2x10 reps     Other Standing Knee Exercises hamstring curls #3 3x10 reps each LE       Knee/Hip Exercises: Supine   Other Supine Knee/Hip Exercises single leg clam with deep abdominal activation 2x10 reps                   PT Education - 01/18/20 1056    Education Details technique with therex    Person(s) Educated Patient    Methods Explanation    Comprehension Verbalized understanding            PT Short Term Goals - 11/21/19 1420      PT SHORT TERM GOAL #1   Title Pt will be compliant and ind with initial HEP including walking 2-3 times a day for short duration as tol    Status Achieved      PT SHORT TERM GOAL #2   Title Pt will demo gait symmetry with improved step length and equal WB between bil LEs.    Status Achieved      PT SHORT TERM GOAL #3   Title Pt will be able to perform light household standing tasks x 5'    Status Achieved      PT SHORT TERM GOAL #4   Title Pt will report improved pain with daily household tasks by at least 20%    Status Achieved             PT Long Term Goals - 01/17/20 1307      PT LONG TERM GOAL #1   Title Pt will achieve at least 4+/5 for trunk and LE strength for improved tolerance of daily tasks.    Time 6    Period Weeks    Status On-going      PT LONG TERM GOAL #2   Title Reduce FOTO to 55% to demo less limitation.    Status Achieved      PT  LONG TERM GOAL #3   Title Pt will tolerate  standing and walking tasks such as light housework and running errands x 20' with pain not to exceed 5/10.    Baseline reports 6/10 pain walking 13'    Status On-going      PT LONG TERM GOAL #4   Title Pt will be able to articulate at least two tools from HEP or behavioral modifications for pain management.    Status Achieved      PT LONG TERM GOAL #5   Title Pt will attend at least 1 aquatic PT session to learn ways to exercise in the pool to reduce load bearing strain while strengthening and building endurance.    Status Achieved      PT LONG TERM GOAL #6   Title Pt will be ind with advanced HEP and understand how to safely progress.    Time 6    Period Weeks    Status On-going      PT LONG TERM GOAL #7   Title Be able to perform single leg stand for at least 3 sec bil LE for reduced risk of falls    Baseline Lt: 3-5 sec, Rt: 5-10 sec    Time 6    Period Weeks    Status Achieved                 Plan - 01/17/20 1313    Clinical Impression Statement Pt is making steady progress towards her long term goals. She met her single leg balance goal with more difficulty noted on the Lt. Pt was able to complete all of today's exercises with only minor cuing from the PT. PT made updates to her current HEP and encouraged pt to follow up with her own aquatic exercise after discharge.    Personal Factors and Comorbidities Age;Past/Current Experience;Comorbidity 1;Comorbidity 2;Comorbidity 3+    Comorbidities lumbar fusion revision L4/5 and new fusion L5/S1 April 2021, Lt TKR with poor outcome/pain, HTN, diabetes, asthma, osteopenia    Examination-Activity Limitations Locomotion Level;Bend;Squat;Stairs;Lift    Examination-Participation Restrictions Cleaning;Community Activity;Shop;Laundry    Stability/Clinical Decision Making Stable/Uncomplicated    Rehab Potential Good    PT Duration 6 weeks    PT Treatment/Interventions Aquatic Therapy;ADLs/Self Care Home  Management;Cryotherapy;Electrical Stimulation;Moist Heat;Gait training;Stair training;Functional mobility training;Therapeutic exercise;Balance training;Neuromuscular re-education;Manual techniques;Patient/family education;Passive range of motion;Dry needling;Spinal Manipulations;Joint Manipulations;Taping    PT Next Visit Plan Hip strength, SLS balance, core strength progression    PT Home Exercise Plan Access Code: [email or web address]    Consulted and Agree with Plan of Care Patient           Patient will benefit from skilled therapeutic intervention in order to improve the following deficits and impairments:  Abnormal gait, Decreased range of motion, Difficulty walking, Increased fascial restricitons, Decreased strength, Postural dysfunction, Improper body mechanics, Impaired flexibility, Hypomobility, Decreased scar mobility, Pain, Decreased activity tolerance, Decreased endurance, Decreased mobility  Visit Diagnosis: Muscle weakness (generalized)  Chronic bilateral low back pain without sciatica  Chronic pain of left knee  Other abnormalities of gait and mobility     Problem List Patient Active Problem List   Diagnosis Date Noted  . Carotid arterial disease ([place]) 01/23/2019  . RBBB (right bundle branch block with left anterior fascicular block)   . Chest pain 01/22/2019  . Weakness of both lower extremities 01/02/2019  . Frequent falls 01/02/2019  . OSA on CPAP 06/15/2017  . Diabetes ([place]) 03/02/2017  . Herpes zoster without complication 94/85/4627  .  B12 nutritional deficiency 02/23/2017  . Vitamin D deficiency 12/08/2016  . Tubular adenoma of colon 04/29/2016  . Essential hypertension 09/12/2015  . Allergic rhinitis 07/17/2015  . Insomnia 07/17/2015  . Obese 04/10/2015  . S/P left TKA 04/09/2015  . Osteopenia 12/04/2012  . 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 RESTLESS LEG SYNDROME 02/11/2009  . LEG EDEMA, BILATERAL 02/11/2009  . Hyperlipidemia 10/30/2008  . Urinary incontinence  10/30/2008  . Skin cancer 10/30/2008  . Asthma 11/21/2007  . Venous (peripheral) insufficiency 11/22/2006    10:57 AM,01/18/20 Sherol Dade PT, DPT [facility] [address] [place], [place], [address] Phone: [phone]   Fax:  [phone]  Name: Erin Roach MRN: [id number] Date of Birth: 1940/12/04

## 2020-01-19 ENCOUNTER — Ambulatory Visit: Payer: Medicare PPO | Admitting: Physical Therapy

## 2020-01-19 ENCOUNTER — Other Ambulatory Visit: Payer: Self-pay

## 2020-01-19 ENCOUNTER — Encounter: Payer: Self-pay | Admitting: Physical Therapy

## 2020-01-19 DIAGNOSIS — G8929 Other chronic pain: Secondary | ICD-10-CM

## 2020-01-19 DIAGNOSIS — M25551 Pain in right hip: Secondary | ICD-10-CM | POA: Diagnosis not present

## 2020-01-19 DIAGNOSIS — M25562 Pain in left knee: Secondary | ICD-10-CM | POA: Diagnosis not present

## 2020-01-19 DIAGNOSIS — M545 Low back pain: Secondary | ICD-10-CM | POA: Diagnosis not present

## 2020-01-19 DIAGNOSIS — R2689 Other abnormalities of gait and mobility: Secondary | ICD-10-CM

## 2020-01-19 DIAGNOSIS — M6281 Muscle weakness (generalized): Secondary | ICD-10-CM

## 2020-01-19 DIAGNOSIS — R262 Difficulty in walking, not elsewhere classified: Secondary | ICD-10-CM | POA: Diagnosis not present

## 2020-01-19 NOTE — Therapy (Signed)
[facility] [address] [place], [place], [address] Phone: [phone]   Fax:  [phone]  Physical Therapy Treatment  Patient Details  Name: Erin Roach MRN: [id number] Date of Birth: [birth year] Referring Provider (PT): Berneda Rose, [place]   Encounter Date: 01/19/2020   PT End of Session - 01/19/20 1556    Visit Number 21    Date for PT Re-Evaluation 01/23/20    Authorization Type [employer]    Authorization Time Period Cohere approved 12 visits, 11/28/19-01/23/20    Authorization - Visit Number 21    Authorization - Number of Visits 24    PT Start Time 1215    PT Stop Time 1255    PT Time Calculation (min) 40 min    Activity Tolerance Patient tolerated treatment well    Behavior During Therapy [facility] for tasks assessed/performed           Past Medical History:  Diagnosis Date  . Allergic rhinitis   . Apnea   . Arthritis   . Asthma    very mild  . Back pain   . Bilateral lower extremity edema   . Carotid artery disease ([place])    a. Carotid duplex 03/2016 - duplex was stable, 26-37% RICA, 8-58% LICA  . Complication of anesthesia    post-op delirium  . Endometrial polyp   . Essential hypertension   . GERD (gastroesophageal reflux disease)   . Heart murmur    per pt  . History of adenomatous polyp of colon    04-24-2016  tubular adenoma  . History of squamous cell carcinoma excision    pilonidal area second excision 02-02-2002  . HTN (hypertension)   . Hyperlipidemia   . Leg pain   . OAB (overactive bladder)   . RA (rheumatoid arthritis) ([place])   . RBBB (right bundle branch block with left anterior fascicular block)   . RLS (restless legs syndrome)   . Shortness of breath on exertion   . SUI (stress urinary incontinence, female)   . Venous insufficiency    legs  . Vitamin D deficiency     Past Surgical History:  Procedure Laterality Date  . APPENDECTOMY  07-02-2005   [name]    open  . CARDIAC CATHETERIZATION  01-23-2009  dr Darnell Level brodie   normal coronary arteries and lvf  . CARDIOVASCULAR STRESS TEST  03/31/2016   Low risk nuclear study w/ medium defect of mild severity in basal anterior and mid anterior location w/ no evidence ischemia or infarction/  normal LV function and wall motion,  nuclear stress ef 71%  . CATARACT EXTRACTION W/ INTRAOCULAR LENS IMPLANT Left 09/2016  . COLONOSCOPY  last one 04-24-2016  . HYSTEROSCOPY WITH D & C N/A 12/22/2016   Procedure: DILATATION AND CURETTAGE /HYSTEROSCOPY;  Surgeon: Dian Queen, MD;  Location: [place];  Service: Gynecology;  Laterality: N/A;  . IR US GUIDANCE  09/08/2016  . POSTERIOR LUMBAR FUSION  02/ 2018    [facility] ([place])  . SKIN CANCER EXCISION    . TONSILLECTOMY  child  . 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 TRANSTHORACIC ECHOCARDIOGRAM  01/21/2009   mild LVH, ef 85-02%, grade 1 diastolic dysfunction  . ULNAR NERVE TRANSPOSITION Left 12-20-2008  dr sypher   decompression and partial resection medial triceps fascia  . WIDE  LOCAL EXCISION PILONIDAL AREA  02-02-2002  [name]   recurrent squamous cell carcinoma in situ    There were no vitals filed for this visit.   Subjective Assessment - 01/19/20 1555    Subjective This is my last day of aquatics. I am planning on continuing with aquatic exercise here at the [facility].    Pertinent History "Shelia";  left TKR;  lumbar fusion; restless legs, HTN; diabetes;  mild neuropathy;  osteopenia    Currently in Pain? No/denies           Pt arrived for aquatic PT. Water temp 87.6 degrees F. Pt entered and exited the pool via 3 steps with heavy use of hand rails.   Standing in mid chest/waist depth water pt performed water walking in all 4 directions 4 lengths of pool. VC for speed in order to  generate appropriate current for resistance. Hip 3 ways 20x ea with min support of pool wall. VC to push/pull against water with a force appropriate. Core contractions in partial wall squat with blue noodle 5 sec hold 10x. Bil heel raises 20x no UE support. White noodle knee extensions 10x bil, UE support required for balance.High knee marching with blue noodle 2 full lengths of pool. Water bicycle with blue noodle x 3 min. UE weights 1 min breast stroke arms each direction in tandem stance. Intermittent decompression floats to unload spine.                             PT Short Term Goals - 11/21/19 1420      PT SHORT TERM GOAL #1   Title Pt will be compliant and ind with initial HEP including walking 2-3 times a day for short duration as tol    Status Achieved      PT SHORT TERM GOAL #2   Title Pt will demo gait symmetry with improved step length and equal WB between bil LEs.    Status Achieved      PT SHORT TERM GOAL #3   Title Pt will be able to perform light household standing tasks x 5'    Status Achieved      PT SHORT TERM GOAL #4   Title Pt will report improved pain with daily household tasks by at least 20%    Status Achieved             PT Long Term Goals - 01/17/20 1307      PT LONG TERM GOAL #1   Title Pt will achieve at least 4+/5 for trunk and LE strength for improved tolerance of daily tasks.    Time 6    Period Weeks    Status On-going      PT LONG TERM GOAL #2   Title Reduce FOTO to 55% to demo less limitation.    Status Achieved      PT LONG TERM GOAL #3   Title Pt will tolerate standing and walking tasks such as light housework and running errands x 20' with pain not to exceed 5/10.    Baseline reports 6/10 pain walking 13'    Status On-going      PT LONG TERM GOAL #4   Title Pt will be able to articulate at least two tools from HEP or behavioral modifications for pain management.    Status Achieved      PT LONG TERM GOAL #5    Title Pt will attend at  least 1 aquatic PT session to learn ways to exercise in the pool to reduce load bearing strain while strengthening and building endurance.    Status Achieved      PT LONG TERM GOAL #6   Title Pt will be ind with advanced HEP and understand how to safely progress.    Time 6    Period Weeks    Status On-going      PT LONG TERM GOAL #7   Title Be able to perform single leg stand for at least 3 sec bil LE for reduced risk of falls    Baseline Lt: 3-5 sec, Rt: 5-10 sec    Time 6    Period Weeks    Status Achieved                 Plan - 01/19/20 1557    Clinical Impression Statement Pt is independent in her understanding of how to best exercise in the water and how to apply the principles of bouyancy, current, thrust, and hydrostatic pressure. Pt continues to have no pain while she exercises in the pool and plans to continue her program at [facility]    Personal Factors and Comorbidities Age;Past/Current Experience;Comorbidity 1;Comorbidity 2;Comorbidity 3+    Comorbidities lumbar fusion revision L4/5 and new fusion L5/S1 April 2021, Lt TKR with poor outcome/pain, HTN, diabetes, asthma, osteopenia    Examination-Activity Limitations Locomotion Level;Bend;Squat;Stairs;Lift    Examination-Participation Restrictions Cleaning;Community Activity;Shop;Laundry    Stability/Clinical Decision Making Stable/Uncomplicated    Rehab Potential Good    PT Frequency 2x / week    PT Duration 6 weeks    PT Treatment/Interventions Aquatic Therapy;ADLs/Self Care Home Management;Cryotherapy;Electrical Stimulation;Moist Heat;Gait training;Stair training;Functional mobility training;Therapeutic exercise;Balance training;Neuromuscular re-education;Manual techniques;Patient/family education;Passive range of motion;Dry needling;Spinal Manipulations;Joint Manipulations;Taping    PT Next Visit Plan DC next session    PT Home Exercise Plan Access Code: [email or web address]    Consulted and Agree with  Plan of Care Patient           Patient will benefit from skilled therapeutic intervention in order to improve the following deficits and impairments:  Abnormal gait, Decreased range of motion, Difficulty walking, Increased fascial restricitons, Decreased strength, Postural dysfunction, Improper body mechanics, Impaired flexibility, Hypomobility, Decreased scar mobility, Pain, Decreased activity tolerance, Decreased endurance, Decreased mobility  Visit Diagnosis: Muscle weakness (generalized)  Chronic pain of left knee  Other abnormalities of gait and mobility     Problem List Patient Active Problem List   Diagnosis Date Noted  . Carotid arterial disease ([place]) 01/23/2019  . RBBB (right bundle branch block with left anterior fascicular block)   . Chest pain 01/22/2019  . Weakness of both lower extremities 01/02/2019  . Frequent falls 01/02/2019  . OSA on CPAP 06/15/2017  . Diabetes ([place]) 03/02/2017  . Herpes zoster without complication 05/03/8249  . B12 nutritional deficiency 02/23/2017  . Vitamin D deficiency 12/08/2016  . Tubular adenoma of colon 04/29/2016  . Essential hypertension 09/12/2015  . Allergic rhinitis 07/17/2015  . Insomnia 07/17/2015  . Obese 04/10/2015  . S/P left TKA 04/09/2015  . Osteopenia 12/04/2012  . 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 RESTLESS LEG SYNDROME 02/11/2009  . LEG EDEMA, BILATERAL 02/11/2009  . Hyperlipidemia 10/30/2008  . Urinary incontinence 10/30/2008  . Skin cancer 10/30/2008  . Asthma 11/21/2007  . Venous (peripheral) insufficiency 11/22/2006    Graciella Arment, PTA 01/19/2020, 3:59 PM  [facility] [address], [place], [place], [address] Phone: [phone]  Fax:  [phone]  Name: Erin Roach MRN: [id number] Date of Birth: 1940-09-20

## 2020-01-20 ENCOUNTER — Other Ambulatory Visit: Payer: Self-pay | Admitting: Pulmonary Disease

## 2020-01-22 ENCOUNTER — Other Ambulatory Visit (INDEPENDENT_AMBULATORY_CARE_PROVIDER_SITE_OTHER): Payer: Self-pay | Admitting: Family Medicine

## 2020-01-22 DIAGNOSIS — E119 Type 2 diabetes mellitus without complications: Secondary | ICD-10-CM

## 2020-01-23 ENCOUNTER — Ambulatory Visit: Payer: Medicare PPO | Admitting: Physical Therapy

## 2020-01-23 ENCOUNTER — Other Ambulatory Visit: Payer: Self-pay

## 2020-01-23 ENCOUNTER — Encounter: Payer: Self-pay | Admitting: Physical Therapy

## 2020-01-23 DIAGNOSIS — G8929 Other chronic pain: Secondary | ICD-10-CM

## 2020-01-23 DIAGNOSIS — M25562 Pain in left knee: Secondary | ICD-10-CM | POA: Diagnosis not present

## 2020-01-23 DIAGNOSIS — M545 Low back pain, unspecified: Secondary | ICD-10-CM

## 2020-01-23 DIAGNOSIS — M6281 Muscle weakness (generalized): Secondary | ICD-10-CM | POA: Diagnosis not present

## 2020-01-23 DIAGNOSIS — R2689 Other abnormalities of gait and mobility: Secondary | ICD-10-CM

## 2020-01-23 DIAGNOSIS — R262 Difficulty in walking, not elsewhere classified: Secondary | ICD-10-CM | POA: Diagnosis not present

## 2020-01-23 DIAGNOSIS — M25551 Pain in right hip: Secondary | ICD-10-CM | POA: Diagnosis not present

## 2020-01-23 NOTE — Therapy (Signed)
[facility] [address], [place], [place], [address] Phone: [phone]   Fax:  [phone]  Physical Therapy Treatment  Patient Details  Name: Erin Roach MRN: [id number] Date of Birth: 1940-11-11 Referring Provider (PT): Berneda Rose, [place]   Encounter Date: 01/23/2020   PT End of Session - 01/23/20 1230    Visit Number 22    Date for PT Re-Evaluation 01/23/20    Authorization Type [employer]    Authorization Time Period Cohere approved 12 visits, 11/28/19-01/23/20    Authorization - Visit Number 64    Authorization - Number of Visits 24    Progress Note Due on Visit 30    PT Start Time 1230    PT Stop Time 1308    PT Time Calculation (min) 38 min    Activity Tolerance Patient tolerated treatment well    Behavior During Therapy [facility] for tasks assessed/performed           Past Medical History:  Diagnosis Date  . Allergic rhinitis   . Apnea   . Arthritis   . Asthma    very mild  . Back pain   . Bilateral lower extremity edema   . Carotid artery disease ([place])    a. Carotid duplex 03/2016 - duplex was stable, 52-77% RICA, 8-24% LICA  . Complication of anesthesia    post-op delirium  . Endometrial polyp   . Essential hypertension   . GERD (gastroesophageal reflux disease)   . Heart murmur    per pt  . History of adenomatous polyp of colon    04-24-2016  tubular adenoma  . History of squamous cell carcinoma excision    pilonidal area second excision 02-02-2002  . HTN (hypertension)   . Hyperlipidemia   . Leg pain   . OAB (overactive bladder)   . RA (rheumatoid arthritis) ([place])   . RBBB (right bundle branch block with left anterior fascicular block)   . RLS (restless legs syndrome)   . Shortness of breath on exertion   . SUI (stress urinary incontinence, female)   . Venous insufficiency    legs  . Vitamin D deficiency     Past Surgical History:  Procedure Laterality Date  . APPENDECTOMY   07-02-2005   [name]   open  . CARDIAC CATHETERIZATION  01-23-2009  dr Darnell Level brodie   normal coronary arteries and lvf  . CARDIOVASCULAR STRESS TEST  03/31/2016   Low risk nuclear study w/ medium defect of mild severity in basal anterior and mid anterior location w/ no evidence ischemia or infarction/  normal LV function and wall motion,  nuclear stress ef 71%  . CATARACT EXTRACTION W/ INTRAOCULAR LENS IMPLANT Left 09/2016  . COLONOSCOPY  last one 04-24-2016  . HYSTEROSCOPY WITH D & C N/A 12/22/2016   Procedure: DILATATION AND CURETTAGE /HYSTEROSCOPY;  Surgeon: Dian Queen, MD;  Location: [place];  Service: Gynecology;  Laterality: N/A;  . IR US GUIDANCE  09/08/2016  . POSTERIOR LUMBAR FUSION  02/ 2018    [facility] ([place])  . SKIN CANCER EXCISION    . TONSILLECTOMY  child  . 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 TRANSTHORACIC ECHOCARDIOGRAM  01/21/2009   mild LVH, ef 23-53%, grade 1 diastolic dysfunction  . ULNAR NERVE TRANSPOSITION Left 12-20-2008  dr sypher   decompression  and partial resection medial triceps fascia  . WIDE LOCAL EXCISION PILONIDAL AREA  02-02-2002  [name]   recurrent squamous cell carcinoma in situ    There were no vitals filed for this visit.   Subjective Assessment - 01/23/20 1234    Subjective I am not having a great day which is not my norm.  I tried to do some of my HEP this morning that usually helps but not today.    Pertinent History "Sariya";  left TKR;  lumbar fusion; restless legs, HTN; diabetes;  mild neuropathy;  osteopenia    Limitations Sitting;Lifting;Walking;House hold activities    How long can you sit comfortably? 45 min    How long can you stand comfortably? 10 min if distracted and not thinking about it    How long can you walk comfortably? 61'    Diagnostic tests  none    Patient Stated Goals I would like to be able to walk 30 minutes, more balanced    Currently in Pain? Yes    Pain Score 4     Pain Location Knee    Pain Orientation Left    Pain Descriptors / Indicators Sore    Pain Onset More than a month ago    Pain Frequency Intermittent    Pain Score 4    Pain Location Back    Pain Orientation Lower;Mid    Pain Type Chronic pain    Pain Onset More than a month ago    Pain Frequency Intermittent    Aggravating Factors  walk/stand    Pain Relieving Factors exercises              [facility] PT Assessment - 01/23/20 0001      Assessment   Medical Diagnosis Z98.1 (ICD-10-CM) - Status post lumbar spinal fusion    Referring Provider (PT) Berneda Rose, PA      Strength   Overall Strength Comments all LE and trunk MMT 5/5 throughout    Right Hip ABduction 5/5                         [facility] Adult PT Treatment/Exercise - 01/23/20 0001      Exercises   Exercises Lumbar;Knee/Hip;Shoulder      Lumbar Exercises: Stretches   Lower Trunk Rotation 5 reps;1 rep;10 seconds    Pelvic Tilt 10 reps    Piriformis Stretch Right;2 reps;20 seconds    Figure 4 Stretch 2 reps;20 seconds;With overpressure    Other Lumbar Stretch Exercise windshield wiper hips x 20 reps      Lumbar Exercises: Aerobic   Nustep L4 x 8', PT present to review goals      Knee/Hip Exercises: Standing   SLS knee flexion march hold 3x10 sec bil light UE support bil    Other Standing Knee Exercises hip extension yellow TB around ankles 2x5 reps       Knee/Hip Exercises: Seated   Sit to Sand 10 reps;without UE support   PT cued hip hinge which eliminated Lt knee pain     Knee/Hip Exercises: Sidelying   Hip ABduction Strengthening;Both;5 reps    Hip ABduction Limitations 10 sec isom hold x 5 reps    Clams 10 reps      Shoulder Exercises: Standing   Other Standing Exercises pallof press green x 10 bil                    PT Short Term Goals -  11/21/19 1420      PT SHORT TERM GOAL #1   Title Pt will be compliant and ind with initial HEP including walking 2-3 times a day for short duration as tol    Status Achieved      PT SHORT TERM GOAL #2   Title Pt will demo gait symmetry with improved step length and equal WB between bil LEs.    Status Achieved      PT SHORT TERM GOAL #3   Title Pt will be able to perform light household standing tasks x 5'    Status Achieved      PT SHORT TERM GOAL #4   Title Pt will report improved pain with daily household tasks by at least 20%    Status Achieved             PT Long Term Goals - 01/23/20 1230      PT LONG TERM GOAL #1   Title Pt will achieve at least 4+/5 for trunk and LE strength for improved tolerance of daily tasks.    Status Achieved      PT LONG TERM GOAL #2   Title Reduce FOTO to 55% to demo less limitation.    Baseline 47% limited    Status Achieved      PT LONG TERM GOAL #3   Title Pt will tolerate standing and walking tasks such as light housework and running errands x 20' with pain not to exceed 5/10.    Status Achieved      PT LONG TERM GOAL #4   Title Pt will be able to articulate at least two tools from HEP or behavioral modifications for pain management.    Status Achieved      PT LONG TERM GOAL #5   Title Pt will attend at least 1 aquatic PT session to learn ways to exercise in the pool to reduce load bearing strain while strengthening and building endurance.    Status Achieved      PT LONG TERM GOAL #6   Title Pt will be ind with advanced HEP and understand how to safely progress.    Status Achieved      PT LONG TERM GOAL #7   Title Be able to perform single leg stand for at least 3 sec bil LE for reduced risk of falls    Baseline Lt: 3-5 sec, Rt: 5-10 sec    Status Achieved                 Plan - 01/23/20 1308    Clinical Impression Statement Pt has met all LTGs and demonstrates 5/5 strength in trunk and LEs today.  She has learned an  aquatic ther ex routine which she plans to continue on her own and demo's good understanding of her land-based HEP.  She arrived with some heightened pain and had some stiffness in Rt hip which improved with focused stretching and spine ROM.  Pt is ready for d/c to HEP.    Comorbidities lumbar fusion revision L4/5 and new fusion L5/S1 April 2021, Lt TKR with poor outcome/pain, HTN, diabetes, asthma, osteopenia    Rehab Potential Good    PT Frequency 2x / week    PT Duration 6 weeks    PT Treatment/Interventions Aquatic Therapy;ADLs/Self Care Home Management;Cryotherapy;Electrical Stimulation;Moist Heat;Gait training;Stair training;Functional mobility training;Therapeutic exercise;Balance training;Neuromuscular re-education;Manual techniques;Patient/family education;Passive range of motion;Dry needling;Spinal Manipulations;Joint Manipulations;Taping    PT Next Visit Plan d/c to HEP    PT  Home Exercise Plan Access Code: [email or web address]    Consulted and Agree with Plan of Care Patient           Patient will benefit from skilled therapeutic intervention in order to improve the following deficits and impairments:     Visit Diagnosis: Muscle weakness (generalized)  Chronic pain of left knee  Other abnormalities of gait and mobility  Chronic bilateral low back pain without sciatica     Problem List Patient Active Problem List   Diagnosis Date Noted  . Carotid arterial disease ([place]) 01/23/2019  . RBBB (right bundle branch block with left anterior fascicular block)   . Chest pain 01/22/2019  . Weakness of both lower extremities 01/02/2019  . Frequent falls 01/02/2019  . OSA on CPAP 06/15/2017  . Diabetes ([place]) 03/02/2017  . Herpes zoster without complication 03/79/4446  . B12 nutritional deficiency 02/23/2017  . Vitamin D deficiency 12/08/2016  . Tubular adenoma of colon 04/29/2016  . Essential hypertension 09/12/2015  . Allergic rhinitis 07/17/2015  . Insomnia 07/17/2015  . Obese  04/10/2015  . S/P left TKA 04/09/2015  . Osteopenia 12/04/2012  . 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 RESTLESS LEG SYNDROME 02/11/2009  . LEG EDEMA, BILATERAL 02/11/2009  . Hyperlipidemia 10/30/2008  . Urinary incontinence 10/30/2008  . Skin cancer 10/30/2008  . Asthma 11/21/2007  . Venous (peripheral) insufficiency 11/22/2006    PHYSICAL THERAPY DISCHARGE SUMMARY  Visits from Start of Care: 22  Current functional level related to goals / functional outcomes: See above   Remaining deficits: See above   Education / Equipment: HEP Plan: Patient agrees to discharge.  Patient goals were met. Patient is being discharged due to meeting the stated rehab goals.  ?????        Baruch Merl, PT 01/23/20 1:11 PM   [facility] [address], [place], [place], [address] Phone: [phone]   Fax:  [phone]  Name: Erin Roach MRN: [id number] Date of Birth: 04/06/1941

## 2020-01-25 ENCOUNTER — Encounter: Payer: Self-pay | Admitting: Internal Medicine

## 2020-01-25 DIAGNOSIS — Z13820 Encounter for screening for osteoporosis: Secondary | ICD-10-CM | POA: Diagnosis not present

## 2020-01-25 DIAGNOSIS — Z1231 Encounter for screening mammogram for malignant neoplasm of breast: Secondary | ICD-10-CM | POA: Diagnosis not present

## 2020-01-29 ENCOUNTER — Encounter: Payer: Self-pay | Admitting: Internal Medicine

## 2020-01-29 DIAGNOSIS — M545 Low back pain, unspecified: Secondary | ICD-10-CM

## 2020-01-29 MED ORDER — HYDROCODONE-ACETAMINOPHEN 5-325 MG PO TABS: 1.0000 | ORAL_TABLET | Freq: Every day | ORAL | 0 refills | Status: DC

## 2020-02-01 ENCOUNTER — Encounter: Payer: Self-pay | Admitting: Internal Medicine

## 2020-02-12 ENCOUNTER — Other Ambulatory Visit (INDEPENDENT_AMBULATORY_CARE_PROVIDER_SITE_OTHER): Payer: Self-pay | Admitting: Family Medicine

## 2020-02-12 ENCOUNTER — Encounter: Payer: Self-pay | Admitting: Internal Medicine

## 2020-02-12 DIAGNOSIS — E559 Vitamin D deficiency, unspecified: Secondary | ICD-10-CM

## 2020-02-14 MED ORDER — ERGOCALCIFEROL 1.25 MG (50000 UT) PO CAPS: 50000.0000 [IU] | ORAL_CAPSULE | ORAL | 0 refills | Status: DC

## 2020-02-15 ENCOUNTER — Encounter: Payer: Self-pay | Admitting: Internal Medicine

## 2020-02-29 ENCOUNTER — Encounter: Payer: Self-pay | Admitting: Internal Medicine

## 2020-02-29 DIAGNOSIS — M545 Low back pain, unspecified: Secondary | ICD-10-CM

## 2020-03-01 MED ORDER — HYDROCODONE-ACETAMINOPHEN 5-325 MG PO TABS: 1.0000 | ORAL_TABLET | Freq: Every day | ORAL | 0 refills | Status: DC

## 2020-03-07 ENCOUNTER — Telehealth: Payer: Medicare PPO | Admitting: Family Medicine

## 2020-03-07 DIAGNOSIS — J3089 Other allergic rhinitis: Secondary | ICD-10-CM

## 2020-03-07 DIAGNOSIS — J45901 Unspecified asthma with (acute) exacerbation: Secondary | ICD-10-CM | POA: Diagnosis not present

## 2020-03-07 MED ORDER — PREDNISONE 20 MG PO TABS: 40.0000 mg | ORAL_TABLET | Freq: Every day | ORAL | 0 refills | Status: DC

## 2020-03-07 MED ORDER — BENZONATATE 100 MG PO CAPS: 100.0000 mg | ORAL_CAPSULE | Freq: Three times a day (TID) | ORAL | 0 refills | Status: DC | PRN

## 2020-03-07 NOTE — Patient Instructions (Addendum)
   It was nice to meet you today, and I really hope you are feeling better soon. I help [facility] out with telemedicine visits on Tuesdays and Thursdays and am available for visits on those days. If you have any concerns or questions following this visit please schedule a follow up visit with your Primary Care doctor or seek care at a local urgent care clinic to avoid delays in care.    Seek in person care promptly if your symptoms worsen, new concerns arise or you are not improving with treatment. Call 911 and/or seek emergency care if you symptoms are severe or life threatening.   -stay home while sick, and if you have [place] please stay home for a full 10 days since the onset of symptoms PLUS one day of no fever and feeling better  -[place] COVID19 testing information: [email or web address] [phone]  -I sent the medication(s) we discussed to your pharmacy: Meds ordered this encounter  Medications  . benzonatate (TESSALON PERLES) 100 MG capsule    Sig: Take 1 capsule (100 mg total) by mouth 3 (three) times daily as needed.    Dispense:  20 capsule    Refill:  0  . predniSONE (DELTASONE) 20 MG tablet    Sig: Take 2 tablets (40 mg total) by mouth daily with breakfast.    Dispense:  8 tablet    Refill:  0    -stay hydrated, drink plenty of fluids and eat small healthy meals - avoid dairy  -follow up with your doctor in 2-3 days unless improving and feeling better

## 2020-03-07 NOTE — Progress Notes (Signed)
Virtual Visit via Video Note  I connected with Erin Roach  on 03/07/20 at  3:00 PM EDT by a video enabled telemedicine application and verified that I am speaking with the correct person using two identifiers.  Location patient: home, [place] Location provider:work or home office Persons participating in the virtual visit: patient, provider  I discussed the limitations of evaluation and management by telemedicine and the availability of in person appointments. The patient expressed understanding and agreed to proceed.   HPI:  Acute telemedicine visit for cough: -Onset: 5 days -Symptoms include:cough - wheezy cough that she feels is her asthma -Denies: fevers, SOB, nasal congestion, malaise, body aches, NVD, hemoptysis -denies any sick contacts -Has tried: proair and symbicort - helps some but not enough -Pertinent past medical history: asthma, allergies - does have some allergies that bother her this time of the year -Pertinent medication allergies: -COVID-19 vaccine status: fully vaccinated with Moderna for [place], has not been vaccinated for the flu yet  ROS: See pertinent positives and negatives per HPI.  Past Medical History:  Diagnosis Date  . Allergic rhinitis   . Apnea   . Arthritis   . Asthma    very mild  . Back pain   . Bilateral lower extremity edema   . Carotid artery disease ([place])    a. Carotid duplex 03/2016 - duplex was stable, 26-20% RICA, 3-55% LICA  . Complication of anesthesia    post-op delirium  . Endometrial polyp   . Essential hypertension   . GERD (gastroesophageal reflux disease)   . Heart murmur    per pt  . History of adenomatous polyp of colon    04-24-2016  tubular adenoma  . History of squamous cell carcinoma excision    pilonidal area second excision 02-02-2002  . HTN (hypertension)   . Hyperlipidemia   . Leg pain   . OAB (overactive bladder)   . RA (rheumatoid arthritis) ([place])   . RBBB (right bundle branch block with left anterior fascicular  block)   . RLS (restless legs syndrome)   . Shortness of breath on exertion   . SUI (stress urinary incontinence, female)   . Venous insufficiency    legs  . Vitamin D deficiency     Past Surgical History:  Procedure Laterality Date  . APPENDECTOMY  07-02-2005   [name]   open  . CARDIAC CATHETERIZATION  01-23-2009  dr Darnell Level brodie   normal coronary arteries and lvf  . CARDIOVASCULAR STRESS TEST  03/31/2016   Low risk nuclear study w/ medium defect of mild severity in basal anterior and mid anterior location w/ no evidence ischemia or infarction/  normal LV function and wall motion,  nuclear stress ef 71%  . CATARACT EXTRACTION W/ INTRAOCULAR LENS IMPLANT Left 09/2016  . COLONOSCOPY  last one 04-24-2016  . HYSTEROSCOPY WITH D & C N/A 12/22/2016   Procedure: DILATATION AND CURETTAGE /HYSTEROSCOPY;  Surgeon: Dian Queen, MD;  Location: [facility];  Service: Gynecology;  Laterality: N/A;  . IR US GUIDANCE  09/08/2016  . POSTERIOR LUMBAR FUSION  02/ 2018    [facility] ([place])  . SKIN CANCER EXCISION    . TONSILLECTOMY  child  . 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 TRANSTHORACIC ECHOCARDIOGRAM  01/21/2009   mild LVH, ef 97-41%, grade 1 diastolic dysfunction  . ULNAR NERVE TRANSPOSITION  Left 12-20-2008  dr sypher   decompression and partial resection medial triceps fascia  . WIDE LOCAL EXCISION PILONIDAL AREA  02-02-2002  [name]   recurrent squamous cell carcinoma in situ     Current Outpatient Medications:  .  ACCU-CHEK GUIDE test strip, TEST TWICE A DAY, Disp: 100 strip, Rfl: 0 .  amitriptyline (ELAVIL) 50 MG tablet, Take 1 tablet (50 mg total) by mouth at bedtime., Disp: , Rfl:  .  benzonatate (TESSALON PERLES) 100 MG capsule, Take 1 capsule (100 mg total) by mouth 3 (three) times daily as needed., Disp:  20 capsule, Rfl: 0 .  Blood Glucose Monitoring Suppl (ONE TOUCH ULTRA 2) w/Device KIT, USE TO TEST BLOOD SUGARS UP TO 4 TIMES DAILY, Disp: 1 each, Rfl: 0 .  budesonide-formoterol (SYMBICORT) 160-4.5 MCG/ACT inhaler, Inhale 2 puffs into the lungs daily., Disp: 1 Inhaler, Rfl: 5 .  ergocalciferol (VITAMIN D2) 1.25 MG (50000 UT) capsule, Take 1 capsule (50,000 Units total) by mouth once a week. Take one tablet by mouth on Mondays, Disp: 12 capsule, Rfl: 0 .  fluticasone (FLONASE) 50 MCG/ACT nasal spray, Place 1 spray into both nostrils daily as needed for allergies., Disp: 11.1 mL, Rfl: 0 .  furosemide (LASIX) 20 MG tablet, TAKE ONE TABLET BY MOUTH DAILY (Patient taking differently: Take 40 mg by mouth daily. ), Disp: 90 tablet, Rfl: 1 .  HORIZANT 600 MG TBCR, Take 1 tablet by mouth at bedtime. , Disp: , Rfl:  .  HYDROcodone-acetaminophen (NORCO/VICODIN) 5-325 MG tablet, Take 1 tablet by mouth at bedtime., Disp: 30 tablet, Rfl: 0 .  Insulin Pen Needle (BD PEN NEEDLE NANO 2ND GEN) 32G X 4 MM MISC, 1 Package by Does not apply route 2 (two) times daily., Disp: 200 each, Rfl: 0 .  Lancets (ONETOUCH ULTRASOFT) lancets, Use as instructed, Disp: 100 each, Rfl: 0 .  liraglutide (VICTOZA) 18 MG/3ML SOPN, DIAL AND INJECT SUBCUTANEOUSLY 1.8MG DAILY EVERY MORNING, Disp: 9 mL, Rfl: 0 .  losartan (COZAAR) 100 MG tablet, TAKE ONE TABLET BY MOUTH DAILY., Disp: 90 tablet, Rfl: 1 .  metFORMIN (GLUCOPHAGE) 500 MG tablet, TAKE 1 TABLET BY MOUTH TWICE A DAY WITH A MEAL., Disp: 180 tablet, Rfl: 1 .  predniSONE (DELTASONE) 20 MG tablet, Take 2 tablets (40 mg total) by mouth daily with breakfast., Disp: 8 tablet, Rfl: 0 .  pregabalin (LYRICA) 75 MG capsule, TAKE ONE CAPSULE BY MOUTH EVERY AFTERNOON AND TAKE TWO CAPSULES BY MOUTH EVERY NIGHT AT BEDTIME, Disp: 270 capsule, Rfl: 0 .  rOPINIRole (REQUIP) 0.5 MG tablet, Take 1 tablet (0.5 mg total) by mouth at bedtime., Disp: 60 tablet, Rfl: 1 .  rOPINIRole (REQUIP) 2 MG tablet,  Take 1 tablet (2 mg total) by mouth at bedtime. Take 2 hours prior to bedtime, Disp: 90 tablet, Rfl: 2 .  simvastatin (ZOCOR) 40 MG tablet, TAKE ONE TABLET BY MOUTH EVERY NIGHT AT BEDTIME, Disp: 90 tablet, Rfl: 1  EXAM:  VITALS per patient if applicable:  GENERAL: alert, oriented, appears well and in no acute distress  HEENT: atraumatic, conjunttiva clear, no obvious abnormalities on inspection of external nose and ears  NECK: normal movements of the head and neck  LUNGS: on inspection no signs of respiratory distress, breathing rate appears normal, no obvious gross SOB, gasping or wheezing, coughs several times  CV: no obvious cyanosis  MS: moves all visible extremities without noticeable abnormality  PSYCH/NEURO: pleasant and cooperative, no obvious depression or anxiety, speech and thought processing grossly  intact  ASSESSMENT AND PLAN:  Discussed the following assessment and plan:  Allergic rhinitis due to other allergic trigger, unspecified seasonality  Asthma with acute exacerbation, unspecified asthma severity, unspecified whether persistent  -we discussed possible serious and likely etiologies, options for evaluation and workup, limitations of telemedicine visit vs in person visit, treatment, treatment risks and precautions. Pt prefers to treat via telemedicine empirically rather than in person at this moment.  Possible asthma exasperation, which is her concern, possibly triggered by seasonal allergies versus a viral respiratory infection versus other.  Also discussed the possibility of COVID-19, testing options, treatment, staying home while sick or longer if testing is positive, potential complications and precautions.  She is opted for empiric treatment with prednisone burst 40 mg daily for 4 days, her inhalers per instructions and Tessalon for cough.  She agrees to [employer] testing.  Advised to seek prompt in person care if worsening, new symptoms arise, or if is not improving  with treatment. Discussed options for inperson care if PCP office not available. .   I discussed the assessment and treatment plan with the patient. The patient was provided an opportunity to ask questions and all were answered. The patient agreed with the plan and demonstrated an understanding of the instructions.     Lucretia Kern, DO

## 2020-03-08 ENCOUNTER — Ambulatory Visit (HOSPITAL_COMMUNITY)
Admission: EM | Admit: 2020-03-08 | Discharge: 2020-03-08 | Disposition: A | Discharge status: Home / Self Care | Payer: Medicare PPO | Attending: Family Medicine | Admitting: Family Medicine

## 2020-03-08 ENCOUNTER — Other Ambulatory Visit: Payer: Self-pay

## 2020-03-08 ENCOUNTER — Encounter (HOSPITAL_COMMUNITY): Payer: Self-pay | Admitting: Emergency Medicine

## 2020-03-08 DIAGNOSIS — J069 Acute upper respiratory infection, unspecified: Secondary | ICD-10-CM | POA: Diagnosis not present

## 2020-03-08 DIAGNOSIS — R059 Cough, unspecified: Secondary | ICD-10-CM | POA: Insufficient documentation

## 2020-03-08 DIAGNOSIS — Z87891 Personal history of nicotine dependence: Secondary | ICD-10-CM | POA: Insufficient documentation

## 2020-03-08 DIAGNOSIS — Z20822 Contact with and (suspected) exposure to covid-19: Secondary | ICD-10-CM | POA: Insufficient documentation

## 2020-03-08 MED ORDER — BENZONATATE 200 MG PO CAPS: 200.0000 mg | ORAL_CAPSULE | Freq: Two times a day (BID) | ORAL | 0 refills | Status: DC | PRN

## 2020-03-08 NOTE — Discharge Instructions (Addendum)
Take combination benzonatate 200 with DM product every 12 hours Increase fluids Use humidifier if you have one

## 2020-03-08 NOTE — ED Triage Notes (Signed)
Patient c/o cough x 4-5 days.   Patient states it's progressively became worst.   Patient started Prednisone and Tessalon Pearls today that was prescribed by another provider. The patient states that the Tessalon Pearls isn't helping.   Patient states she's used her at home inhaler which haven't helped with symptoms.   History of Asthma

## 2020-03-08 NOTE — ED Provider Notes (Signed)
[facility]    CSN: [id number] Arrival date & time: 03/08/20  1357      History   Chief Complaint Chief Complaint  Patient presents with  . Cough    HPI Erin Roach is a 79 y.o. female.   HPI  Patient has a history of asthma.  She has not had any asthma, use of inhalers, or wheezing for years.  Patient states that she currently has an upper respiratory infection.  She had a virtual visit yesterday and was given Tessalon and prednisone.  She states this has not helped the cough at all.  She has been coughing for for 5 days.  Harsh cough.  No sputum production.  No fever or chills.  No nausea or vomiting.  No loss of taste or smell.  She has had Covid vaccinations.  Past Medical History:  Diagnosis Date  . Allergic rhinitis   . Apnea   . Arthritis   . Asthma    very mild  . Back pain   . Bilateral lower extremity edema   . Carotid artery disease ([place])    a. Carotid duplex 03/2016 - duplex was stable, 12-45% RICA, 8-09% LICA  . Complication of anesthesia    post-op delirium  . Endometrial polyp   . Essential hypertension   . GERD (gastroesophageal reflux disease)   . Heart murmur    per pt  . History of adenomatous polyp of colon    04-24-2016  tubular adenoma  . History of squamous cell carcinoma excision    pilonidal area second excision 02-02-2002  . HTN (hypertension)   . Hyperlipidemia   . Leg pain   . OAB (overactive bladder)   . RA (rheumatoid arthritis) ([place])   . RBBB (right bundle branch block with left anterior fascicular block)   . RLS (restless legs syndrome)   . Shortness of breath on exertion   . SUI (stress urinary incontinence, female)   . Venous insufficiency    legs  . Vitamin D deficiency     Patient Active Problem List   Diagnosis Date Noted  . Carotid arterial disease ([place]) 01/23/2019  . RBBB (right bundle branch block with left anterior fascicular block)   . Chest pain 01/22/2019  . Weakness of both lower extremities  01/02/2019  . Frequent falls 01/02/2019  . OSA on CPAP 06/15/2017  . Diabetes ([place]) 03/02/2017  . Herpes zoster without complication 98/33/8250  . B12 nutritional deficiency 02/23/2017  . Vitamin D deficiency 12/08/2016  . Tubular adenoma of colon 04/29/2016  . Essential hypertension 09/12/2015  . Allergic rhinitis 07/17/2015  . Insomnia 07/17/2015  . Obese 04/10/2015  . S/P left TKA 04/09/2015  . Osteopenia 12/04/2012  . 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 RESTLESS LEG SYNDROME 02/11/2009  . LEG EDEMA, BILATERAL 02/11/2009  . Hyperlipidemia 10/30/2008  . Urinary incontinence 10/30/2008  . Skin cancer 10/30/2008  . Asthma 11/21/2007  . Venous (peripheral) insufficiency 11/22/2006    Past Surgical History:  Procedure Laterality Date  . APPENDECTOMY  07-02-2005   [name]   open  . CARDIAC CATHETERIZATION  01-23-2009  dr Darnell Level brodie   normal coronary arteries and lvf  . CARDIOVASCULAR STRESS TEST  03/31/2016   Low risk nuclear study w/ medium defect of mild severity in basal anterior and mid anterior location w/ no evidence ischemia or infarction/  normal LV function and wall motion,  nuclear stress ef 71%  . CATARACT EXTRACTION W/ INTRAOCULAR LENS IMPLANT Left 09/2016  .  COLONOSCOPY  last one 04-24-2016  . HYSTEROSCOPY WITH D & C N/A 12/22/2016   Procedure: DILATATION AND CURETTAGE /HYSTEROSCOPY;  Surgeon: Dian Queen, MD;  Location: [facility];  Service: Gynecology;  Laterality: N/A;  . IR US GUIDANCE  09/08/2016  . POSTERIOR LUMBAR FUSION  02/ 2018    [facility] ([place], [place])  . SKIN CANCER EXCISION    . TONSILLECTOMY  child  . 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 TRANSTHORACIC ECHOCARDIOGRAM  01/21/2009   mild LVH, ef 50-35%, grade 1 diastolic dysfunction  . ULNAR NERVE TRANSPOSITION Left 12-20-2008  dr sypher     decompression and partial resection medial triceps fascia  . WIDE LOCAL EXCISION PILONIDAL AREA  02-02-2002  [name]   recurrent squamous cell carcinoma in situ    OB History    Gravida  2   Para  2   Term  2   Preterm      AB      Living  2     SAB      TAB      Ectopic      Multiple      Live Births  2            Home Medications    Prior to Admission medications   Medication Sig Start Date End Date Taking? Authorizing Provider  predniSONE (DELTASONE) 20 MG tablet Take 2 tablets (40 mg total) by mouth daily with breakfast. 03/07/20  Yes Colin Benton R, DO  ACCU-CHEK GUIDE test strip TEST TWICE A DAY 08/02/19   Dennard Nip D, MD  amitriptyline (ELAVIL) 50 MG tablet Take 1 tablet (50 mg total) by mouth at bedtime. 09/29/19   Binnie Rail, MD  benzonatate (TESSALON) 200 MG capsule Take 1 capsule (200 mg total) by mouth 2 (two) times daily as needed for cough. 03/08/20   Raylene Everts, MD  Blood Glucose Monitoring Suppl (ONE TOUCH ULTRA 2) w/Device KIT USE TO TEST BLOOD SUGARS UP TO 4 TIMES DAILY 08/01/18   Dennard Nip D, MD  budesonide-formoterol [facility]) 160-4.5 MCG/ACT inhaler Inhale 2 puffs into the lungs daily. 08/08/18   Binnie Rail, MD  ergocalciferol (VITAMIN D2) 1.25 MG (50000 UT) capsule Take 1 capsule (50,000 Units total) by mouth once a week. Take one tablet by mouth on Mondays 02/14/20   Binnie Rail, MD  fluticasone [facility]) 50 MCG/ACT nasal spray Place 1 spray into both nostrils daily as needed for allergies. 07/03/19   Dennard Nip D, MD  furosemide (LASIX) 20 MG tablet TAKE ONE TABLET BY MOUTH DAILY Patient taking differently: Take 40 mg by mouth daily.  08/08/18   Burns, Claudina Lick, MD  HORIZANT 600 MG TBCR Take 1 tablet by mouth at bedtime.  06/10/18   [provider]  HYDROcodone-acetaminophen (NORCO/VICODIN) 5-325 MG tablet Take 1 tablet by mouth at bedtime. 03/01/20   Binnie Rail, MD  Insulin Pen Needle (BD PEN  NEEDLE NANO 2ND GEN) 32G X 4 MM MISC 1 Package by Does not apply route 2 (two) times daily. 08/15/19   Dennard Nip D, MD  Lancets [facility] ULTRASOFT) lancets Use as instructed 05/24/19   Dennard Nip D, MD  liraglutide (VICTOZA) 18 MG/3ML SOPN DIAL AND INJECT SUBCUTANEOUSLY 1.8MG DAILY EVERY MORNING 01/16/20   Binnie Rail, MD  losartan (COZAAR) 100  MG tablet TAKE ONE TABLET BY MOUTH DAILY. 10/11/19   Binnie Rail, MD  metFORMIN (GLUCOPHAGE) 500 MG tablet TAKE 1 TABLET BY MOUTH TWICE A DAY WITH A MEAL. 10/29/19   Binnie Rail, MD  pregabalin (LYRICA) 75 MG capsule TAKE ONE CAPSULE BY MOUTH EVERY AFTERNOON AND TAKE TWO CAPSULES BY MOUTH EVERY NIGHT AT BEDTIME 09/29/19   Burns, Claudina Lick, MD  rOPINIRole (REQUIP) 0.5 MG tablet Take 1 tablet (0.5 mg total) by mouth at bedtime. 10/25/19   Rigoberto Noel, MD  rOPINIRole (REQUIP) 2 MG tablet Take 1 tablet (2 mg total) by mouth at bedtime. Take 2 hours prior to bedtime 12/20/19   Rigoberto Noel, MD  simvastatin (ZOCOR) 40 MG tablet TAKE ONE TABLET BY MOUTH EVERY NIGHT AT BEDTIME 10/20/19   Binnie Rail, MD    Family History Family History  Problem Relation Age of Onset  . Esophageal cancer Father   . Heart disease Father        Unknown what kind  . Hyperlipidemia Father   . Hypertension Father   . Cancer Father   . Heart failure Father   . Hyperlipidemia Mother   . Hypertension Mother   . CAD Sister        21 years younger than patient - had stent. Was told that her anorexia/bulimia may have played a role  . Stroke Maternal Grandmother 66  . Diabetes Maternal Grandmother   . Heart attack Maternal Grandfather 93  . COPD Maternal Grandfather   . Heart attack Paternal Grandmother        38s  . Diabetes Other        [place]    Social History Social History   Tobacco Use  . Smoking status: Former Smoker    Packs/day: 0.50    Years: 10.00    Pack years: 5.00    Types: Cigarettes    Quit date: 12/10/1973    Years since quitting: 46.2  .  Smokeless tobacco: Never Used  Vaping Use  . Vaping Use: Never used  Substance Use Topics  . Alcohol use: Yes    Comment: occasional  . Drug use: No     Allergies   Dust mite extract   Review of Systems Review of Systems See HPI  Physical Exam Triage Vital Signs ED Triage Vitals  Enc Vitals Group     BP 03/08/20 1511 (!) 156/76     Pulse Rate 03/08/20 1511 (!) 115     Resp 03/08/20 1511 20     Temp 03/08/20 1511 99.3 F (37.4 C)     Temp Source 03/08/20 1511 Oral     SpO2 03/08/20 1511 96 %     Weight 03/08/20 1507 210 lb (95.3 kg)     Height 03/08/20 1507 _0  (1.676 m)     Head Circumference --      Peak Flow --      Pain Score 03/08/20 1507 6     Pain Loc --      Pain Edu? --      Excl. in [place]? --    No data found.  Updated Vital Signs BP (!) 156/76 (BP Location: Right Arm)   Pulse (!) 115   Temp 99.3 F (37.4 C) (Oral)   Resp 20   Ht _1  (1.676 m)   Wt 95.3 kg   SpO2 96%   BMI 33.89 kg/m      Physical Exam Constitutional:  General: She is not in acute distress.    Appearance: She is well-developed.     Comments: Pleasant.  Overweight.  No acute distress  HENT:     Head: Normocephalic and atraumatic.  Eyes:     Conjunctiva/sclera: Conjunctivae normal.     Pupils: Pupils are equal, round, and reactive to light.  Cardiovascular:     Rate and Rhythm: Tachycardia present.     Heart sounds: Normal heart sounds.  Pulmonary:     Effort: Pulmonary effort is normal. No respiratory distress.     Breath sounds: Normal breath sounds.     Comments: Lungs are clear throughout Abdominal:     Palpations: Abdomen is soft.  Musculoskeletal:        General: Normal range of motion.     Cervical back: Normal range of motion.  Skin:    General: Skin is warm and dry.  Neurological:     Mental Status: She is alert.  Psychiatric:        Mood and Affect: Mood normal.        Behavior: Behavior normal.      UC Treatments / Results  Labs (all labs  ordered are listed, but only abnormal results are displayed) Labs Reviewed  SARS CORONAVIRUS 2 (TAT 6-24 HRS)    EKG   Radiology No results found.  Procedures Procedures (including critical care time)  Medications Ordered in UC Medications - No data to display  Initial Impression / Assessment and Plan / UC Course  I have reviewed the triage vital signs and the nursing notes.  Pertinent labs & imaging results that were available during my care of the patient were reviewed by me and considered in my medical decision making (see chart for details).     *Patient has a viral bronchitis.  Will not be helped with any antibiotics.  Will Talked about symptomatic management and cough reduction. Final Clinical Impressions(s) / UC Diagnoses   Final diagnoses:  Viral URI with cough     Discharge Instructions     Take combination benzonatate 200 with DM product every 12 hours Increase fluids Use humidifier if you have one   ED Prescriptions    Medication Sig Dispense Auth. Provider   benzonatate (TESSALON) 200 MG capsule Take 1 capsule (200 mg total) by mouth 2 (two) times daily as needed for cough. 20 capsule Raylene Everts, MD     PDMP not reviewed this encounter.   Raylene Everts, MD 03/08/20 [phone]

## 2020-03-09 ENCOUNTER — Ambulatory Visit: Payer: Medicare PPO

## 2020-03-09 LAB — SARS CORONAVIRUS 2 (TAT 6-24 HRS): SARS Coronavirus 2: NEGATIVE

## 2020-03-13 ENCOUNTER — Encounter: Payer: Self-pay | Admitting: Family Medicine

## 2020-03-13 MED ORDER — PREDNISONE 10 MG PO TABS: ORAL_TABLET | ORAL | 0 refills | Status: DC

## 2020-03-18 DIAGNOSIS — M545 Low back pain, unspecified: Secondary | ICD-10-CM | POA: Diagnosis not present

## 2020-03-31 ENCOUNTER — Encounter: Payer: Self-pay | Admitting: Internal Medicine

## 2020-03-31 DIAGNOSIS — M545 Low back pain, unspecified: Secondary | ICD-10-CM

## 2020-04-01 ENCOUNTER — Encounter: Payer: Self-pay | Admitting: Internal Medicine

## 2020-04-01 ENCOUNTER — Other Ambulatory Visit (INDEPENDENT_AMBULATORY_CARE_PROVIDER_SITE_OTHER): Payer: Self-pay | Admitting: Family Medicine

## 2020-04-01 DIAGNOSIS — E119 Type 2 diabetes mellitus without complications: Secondary | ICD-10-CM

## 2020-04-01 MED ORDER — HYDROCODONE-ACETAMINOPHEN 5-325 MG PO TABS: 1.0000 | ORAL_TABLET | Freq: Every day | ORAL | 0 refills | Status: DC

## 2020-04-01 MED ORDER — BD PEN NEEDLE NANO 2ND GEN 32G X 4 MM MISC: 1.0000 | Freq: Two times a day (BID) | 0 refills | Status: DC

## 2020-04-09 DIAGNOSIS — D649 Anemia, unspecified: Secondary | ICD-10-CM | POA: Insufficient documentation

## 2020-04-09 NOTE — Patient Instructions (Addendum)
    Blood work was ordered.      Medications changes include :   none     Please followup in 6 months  

## 2020-04-09 NOTE — Progress Notes (Signed)
Subjective:    Patient ID: Erin Roach, female    DOB: 07/14/1940, 79 y.o.   MRN: [id number]  HPI The patient is here for follow up of their chronic medical problems, including htn, hyperlipidemia, DM, RLS, asthma  The RLS is still an issue.  She is seeing the sleep and neurology specialists in [place].   She saw podiatry today.  She has chronic ankle swelling. She had ABI's and she was told her RLE there was a problem.  She advised seeing a neurologist.  When walking she is limited by her left knee pain so it is difficult to tell if she has pain in her legs from something else.     Sugar this am - 115.  Sugars are well controlled.  Medications and allergies reviewed with patient and updated if appropriate.  Patient Active Problem List   Diagnosis Date Noted  . Anemia 04/09/2020  . Carotid arterial disease ([place]) 01/23/2019  . RBBB (right bundle branch block with left anterior fascicular block)   . Chest pain 01/22/2019  . Weakness of both lower extremities 01/02/2019  . Frequent falls 01/02/2019  . OSA on CPAP 06/15/2017  . Diabetes ([place]) 03/02/2017  . Herpes zoster without complication 84/13/2440  . B12 nutritional deficiency 02/23/2017  . Vitamin D deficiency 12/08/2016  . Tubular adenoma of colon 04/29/2016  . Essential hypertension 09/12/2015  . Allergic rhinitis 07/17/2015  . Insomnia 07/17/2015  . Obese 04/10/2015  . S/P left TKA 04/09/2015  . Osteopenia 12/04/2012  . 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 RESTLESS LEG SYNDROME 02/11/2009  . LEG EDEMA, BILATERAL 02/11/2009  . Hyperlipidemia 10/30/2008  . Urinary incontinence 10/30/2008  . Skin cancer 10/30/2008  . Asthma 11/21/2007  . Venous (peripheral) insufficiency 11/22/2006    Current Outpatient Medications on File Prior to Visit  Medication Sig Dispense Refill  . ACCU-CHEK GUIDE test strip TEST TWICE A DAY 100 strip 0  . amitriptyline (ELAVIL) 50 MG tablet Take 1 tablet (50 mg total) by mouth at bedtime.      . Blood Glucose Monitoring Suppl (ONE TOUCH ULTRA 2) w/Device KIT USE TO TEST BLOOD SUGARS UP TO 4 TIMES DAILY 1 each 0  . budesonide-formoterol (SYMBICORT) 160-4.5 MCG/ACT inhaler Inhale 2 puffs into the lungs daily. 1 Inhaler 5  . ergocalciferol (VITAMIN D2) 1.25 MG (50000 UT) capsule Take 1 capsule (50,000 Units total) by mouth once a week. Take one tablet by mouth on Mondays 12 capsule 0  . fluticasone (FLONASE) 50 MCG/ACT nasal spray Place 1 spray into both nostrils daily as needed for allergies. 11.1 mL 0  . furosemide (LASIX) 20 MG tablet TAKE ONE TABLET BY MOUTH DAILY (Patient taking differently: Take 40 mg by mouth daily. ) 90 tablet 1  . HORIZANT 600 MG TBCR Take 1 tablet by mouth at bedtime.     [name] HYDROcodone-acetaminophen (NORCO/VICODIN) 5-325 MG tablet Take 1 tablet by mouth at bedtime. 30 tablet 0  . Insulin Pen Needle (BD PEN NEEDLE NANO 2ND GEN) 32G X 4 MM MISC 1 Package by Does not apply route 2 (two) times daily. 200 each 0  . Lancets (ONETOUCH ULTRASOFT) lancets Use as instructed 100 each 0  . liraglutide (VICTOZA) 18 MG/3ML SOPN DIAL AND INJECT SUBCUTANEOUSLY 1.8MG DAILY EVERY MORNING 9 mL 0  . losartan (COZAAR) 100 MG tablet TAKE ONE TABLET BY MOUTH DAILY. 90 tablet 1  . metFORMIN (GLUCOPHAGE) 500 MG tablet TAKE 1 TABLET BY MOUTH TWICE A DAY WITH A MEAL.  180 tablet 1  . pregabalin (LYRICA) 75 MG capsule TAKE ONE CAPSULE BY MOUTH EVERY AFTERNOON AND TAKE TWO CAPSULES BY MOUTH EVERY NIGHT AT BEDTIME 270 capsule 0  . rOPINIRole (REQUIP) 0.5 MG tablet Take 1 tablet (0.5 mg total) by mouth at bedtime. 60 tablet 1  . rOPINIRole (REQUIP) 2 MG tablet Take 1 tablet (2 mg total) by mouth at bedtime. Take 2 hours prior to bedtime 90 tablet 2  . simvastatin (ZOCOR) 40 MG tablet TAKE ONE TABLET BY MOUTH EVERY NIGHT AT BEDTIME 90 tablet 1  . pramipexole (MIRAPEX) 0.25 MG tablet Take 0.25 mg by mouth at bedtime.     No current facility-administered medications on file prior to visit.     Past Medical History:  Diagnosis Date  . Allergic rhinitis   . Apnea   . Arthritis   . Asthma    very mild  . Back pain   . Bilateral lower extremity edema   . Carotid artery disease ([place])    a. Carotid duplex 03/2016 - duplex was stable, 09-38% RICA, 1-82% LICA  . Complication of anesthesia    post-op delirium  . Endometrial polyp   . Essential hypertension   . GERD (gastroesophageal reflux disease)   . Heart murmur    per pt  . History of adenomatous polyp of colon    04-24-2016  tubular adenoma  . History of squamous cell carcinoma excision    pilonidal area second excision 02-02-2002  . HTN (hypertension)   . Hyperlipidemia   . Leg pain   . OAB (overactive bladder)   . RA (rheumatoid arthritis) ([place])   . RBBB (right bundle branch block with left anterior fascicular block)   . RLS (restless legs syndrome)   . Shortness of breath on exertion   . SUI (stress urinary incontinence, female)   . Venous insufficiency    legs  . Vitamin D deficiency     Past Surgical History:  Procedure Laterality Date  . APPENDECTOMY  07-02-2005   [name]   open  . CARDIAC CATHETERIZATION  01-23-2009  dr Darnell Level brodie   normal coronary arteries and lvf  . CARDIOVASCULAR STRESS TEST  03/31/2016   Low risk nuclear study w/ medium defect of mild severity in basal anterior and mid anterior location w/ no evidence ischemia or infarction/  normal LV function and wall motion,  nuclear stress ef 71%  . CATARACT EXTRACTION W/ INTRAOCULAR LENS IMPLANT Left 09/2016  . COLONOSCOPY  last one 04-24-2016  . HYSTEROSCOPY WITH D & C N/A 12/22/2016   Procedure: DILATATION AND CURETTAGE /HYSTEROSCOPY;  Surgeon: Dian Queen, MD;  Location: [facility];  Service: Gynecology;  Laterality: N/A;  . IR US GUIDANCE  09/08/2016  . POSTERIOR LUMBAR FUSION  02/ 2018    [facility] ([place])  . SKIN CANCER EXCISION    . TONSILLECTOMY  child  . TOTAL KNEE ARTHROPLASTY Left  04/09/2015   Procedure: LEFT TOTAL KNEE ARTHROPLASTY;  Surgeon: Paralee Cancel, MD;  Location: [facility];  Service: Orthopedics;  Laterality: Left;  . TRANSTHORACIC ECHOCARDIOGRAM  01/21/2009   mild LVH, ef 99-37%, grade 1 diastolic dysfunction  . ULNAR NERVE TRANSPOSITION Left 12-20-2008  dr sypher   decompression and partial resection medial triceps fascia  . WIDE LOCAL EXCISION PILONIDAL AREA  02-02-2002  [name]   recurrent squamous cell carcinoma in situ    Social History   Socioeconomic History  . Marital status: Married    Spouse  name: Clare Gandy  . Number of children: 2  . Years of education: Not on file  . Highest education level: Not on file  Occupational History  . Occupation: retired Pharmacist, hospital  Tobacco Use  . Smoking status: Former Smoker    Packs/day: 0.50    Years: 10.00    Pack years: 5.00    Types: Cigarettes    Quit date: 12/10/1973    Years since quitting: 46.3  . Smokeless tobacco: Never Used  Vaping Use  . Vaping Use: Never used  Substance and Sexual Activity  . Alcohol use: Yes    Comment: occasional  . Drug use: No  . Sexual activity: Not on file  Other Topics Concern  . Not on file  Social History Narrative   2 children ages 30,35   Social Determinants of Health   Financial Resource Strain:   . Difficulty of Paying Living Expenses: Not on file  Food Insecurity:   . Worried About Charity fundraiser in the Last Year: Not on file  . Ran Out of Food in the Last Year: Not on file  Transportation Needs:   . Lack of Transportation (Medical): Not on file  . Lack of Transportation (Non-Medical): Not on file  Physical Activity:   . Days of Exercise per Week: Not on file  . Minutes of Exercise per Session: Not on file  Stress:   . Feeling of Stress : Not on file  Social Connections:   . Frequency of Communication with Friends and Family: Not on file  . Frequency of Social Gatherings with Friends and Family: Not on file  . Attends Religious Services: Not on  file  . Active Member of Clubs or Organizations: Not on file  . Attends Archivist Meetings: Not on file  . Marital Status: Not on file    Family History  Problem Relation Age of Onset  . Esophageal cancer Father   . Heart disease Father        Unknown what kind  . Hyperlipidemia Father   . Hypertension Father   . Cancer Father   . Heart failure Father   . Hyperlipidemia Mother   . Hypertension Mother   . CAD Sister        49 years younger than patient - had stent. Was told that her anorexia/bulimia may have played a role  . Stroke Maternal Grandmother 66  . Diabetes Maternal Grandmother   . Heart attack Maternal Grandfather 93  . COPD Maternal Grandfather   . Heart attack Paternal Grandmother        36s  . Diabetes Other        MGGM    Review of Systems  Constitutional: Negative for chills and fever.  Respiratory: Positive for cough (occ - ? asthma). Negative for shortness of breath and wheezing.   Cardiovascular: Positive for leg swelling. Negative for chest pain and palpitations.  Neurological: Negative for light-headedness and headaches.       Objective:   Vitals:   04/10/20 1421  BP: 140/82  Pulse: 95  Temp: 98.2 F (36.8 C)  SpO2: 97%   BP Readings from Last 3 Encounters:  04/10/20 140/82  03/08/20 (!) 156/76  10/25/19 132/78   Wt Readings from Last 3 Encounters:  04/10/20 207 lb (93.9 kg)  03/08/20 210 lb (95.3 kg)  10/25/19 210 lb 3.2 oz (95.3 kg)   Body mass index is 33.41 kg/m.   Physical Exam    Constitutional: Appears well-developed and  well-nourished. No distress.  HENT:  Head: Normocephalic and atraumatic.  Neck: Neck supple. No tracheal deviation present. No thyromegaly present.  No cervical lymphadenopathy Cardiovascular: Normal rate, regular rhythm and normal heart sounds.   No murmur heard. No carotid bruit .  Mild b/l LE edema, several varicose veins b/l feet Pulmonary/Chest: Effort normal and breath sounds normal. No  respiratory distress. No has no wheezes. No rales.  Skin: Skin is warm and dry. Not diaphoretic.  Psychiatric: Normal mood and affect. Behavior is normal.      Assessment & Plan:    See Problem List for Assessment and Plan of chronic medical problems.    This visit occurred during the SARS-CoV-2 public health emergency.  Safety protocols were in place, including screening questions prior to the visit, additional usage of staff PPE, and extensive cleaning of exam room while observing appropriate contact time as indicated for disinfecting solutions.

## 2020-04-10 ENCOUNTER — Other Ambulatory Visit: Payer: Self-pay

## 2020-04-10 ENCOUNTER — Ambulatory Visit (INDEPENDENT_AMBULATORY_CARE_PROVIDER_SITE_OTHER): Payer: Medicare PPO | Admitting: Internal Medicine

## 2020-04-10 ENCOUNTER — Encounter: Payer: Self-pay | Admitting: Internal Medicine

## 2020-04-10 VITALS — BP 140/82 | HR 95 | Temp 98.2°F | Ht 66.0 in | Wt 207.0 lb

## 2020-04-10 DIAGNOSIS — B351 Tinea unguium: Secondary | ICD-10-CM | POA: Diagnosis not present

## 2020-04-10 DIAGNOSIS — R809 Proteinuria, unspecified: Secondary | ICD-10-CM

## 2020-04-10 DIAGNOSIS — E7849 Other hyperlipidemia: Secondary | ICD-10-CM | POA: Diagnosis not present

## 2020-04-10 DIAGNOSIS — I10 Essential (primary) hypertension: Secondary | ICD-10-CM | POA: Diagnosis not present

## 2020-04-10 DIAGNOSIS — D649 Anemia, unspecified: Secondary | ICD-10-CM | POA: Diagnosis not present

## 2020-04-10 DIAGNOSIS — G2581 Restless legs syndrome: Secondary | ICD-10-CM

## 2020-04-10 DIAGNOSIS — I872 Venous insufficiency (chronic) (peripheral): Secondary | ICD-10-CM

## 2020-04-10 DIAGNOSIS — J45901 Unspecified asthma with (acute) exacerbation: Secondary | ICD-10-CM

## 2020-04-10 DIAGNOSIS — E1129 Type 2 diabetes mellitus with other diabetic kidney complication: Secondary | ICD-10-CM | POA: Diagnosis not present

## 2020-04-10 DIAGNOSIS — M79674 Pain in right toe(s): Secondary | ICD-10-CM | POA: Diagnosis not present

## 2020-04-10 DIAGNOSIS — L603 Nail dystrophy: Secondary | ICD-10-CM | POA: Diagnosis not present

## 2020-04-10 DIAGNOSIS — I739 Peripheral vascular disease, unspecified: Secondary | ICD-10-CM | POA: Diagnosis not present

## 2020-04-10 LAB — CBC WITH DIFFERENTIAL/PLATELET
Basophils Absolute: 0.1 10*3/uL (ref 0.0–0.1)
Basophils Relative: 1 % (ref 0.0–3.0)
Eosinophils Absolute: 0.3 10*3/uL (ref 0.0–0.7)
Eosinophils Relative: 4.6 % (ref 0.0–5.0)
HCT: 35.5 % — ABNORMAL LOW (ref 36.0–46.0)
Hemoglobin: 12.1 g/dL (ref 12.0–15.0)
Lymphocytes Relative: 22.1 % (ref 12.0–46.0)
Lymphs Abs: 1.6 10*3/uL (ref 0.7–4.0)
MCHC: 34.1 g/dL (ref 30.0–36.0)
MCV: 85.4 fl (ref 78.0–100.0)
Monocytes Absolute: 0.6 10*3/uL (ref 0.1–1.0)
Monocytes Relative: 7.9 % (ref 3.0–12.0)
Neutro Abs: 4.6 10*3/uL (ref 1.4–7.7)
Neutrophils Relative %: 64.4 % (ref 43.0–77.0)
Platelets: 267 10*3/uL (ref 150.0–400.0)
RBC: 4.15 Mil/uL (ref 3.87–5.11)
RDW: 14.1 % (ref 11.5–15.5)
WBC: 7.1 10*3/uL (ref 4.0–10.5)

## 2020-04-10 LAB — COMPREHENSIVE METABOLIC PANEL
ALT: 39 U/L — ABNORMAL HIGH (ref 0–35)
AST: 30 U/L (ref 0–37)
Albumin: 4.2 g/dL (ref 3.5–5.2)
Alkaline Phosphatase: 77 U/L (ref 39–117)
BUN: 21 mg/dL (ref 6–23)
CO2: 28 mEq/L (ref 19–32)
Calcium: 9.2 mg/dL (ref 8.4–10.5)
Chloride: 103 mEq/L (ref 96–112)
Creatinine, Ser: 0.98 mg/dL (ref 0.40–1.20)
GFR: 55.77 mL/min — ABNORMAL LOW (ref >60.00)
Glucose, Bld: 86 mg/dL (ref 70–99)
Potassium: 4.7 mEq/L (ref 3.5–5.1)
Sodium: 139 mEq/L (ref 135–145)
Total Bilirubin: 0.4 mg/dL (ref 0.2–1.2)
Total Protein: 7.3 g/dL (ref 6.0–8.3)

## 2020-04-10 LAB — HEMOGLOBIN A1C: Hgb A1c MFr Bld: 6.1 % (ref 4.6–6.5)

## 2020-04-10 LAB — LIPID PANEL
Cholesterol: 187 mg/dL (ref 0–200)
HDL: 54.7 mg/dL (ref >39.00)
NonHDL: 132.73
Total CHOL/HDL Ratio: 3
Triglycerides: 225 mg/dL — ABNORMAL HIGH (ref 0.0–149.0)
VLDL: 45 mg/dL — ABNORMAL HIGH (ref 0.0–40.0)

## 2020-04-10 LAB — LDL CHOLESTEROL, DIRECT: Direct LDL: 103 mg/dL

## 2020-04-10 NOTE — Assessment & Plan Note (Signed)
Chronic CBC, iron panel

## 2020-04-10 NOTE — Assessment & Plan Note (Signed)
Chronic BP controlled Continue lasix 40 g daily, losartan 100 mg  cmp

## 2020-04-10 NOTE — Assessment & Plan Note (Signed)
Chronic Check lipid panel  Continue simvastatin 40 mg daily Regular exercise and healthy diet encouraged  

## 2020-04-10 NOTE — Assessment & Plan Note (Signed)
Chronic Following with neurologist and sleep medicine specialist in [place] Symptoms very Continue Mirapex 0.25 mg nightly, Lyrica 75 mg in afternoon and 150 mg at night, Horizant 600 mg at bedtime and hydrocodone-acetaminophen 5-325 mg at bedtime

## 2020-04-10 NOTE — Assessment & Plan Note (Signed)
Chronic Controlled Continue Symbicort twice daily

## 2020-04-10 NOTE — Assessment & Plan Note (Signed)
Chronic Wears compression socks most days, which does help-encourage wearing them on a daily basis Continue Lasix 40 mg daily CMP

## 2020-04-10 NOTE — Assessment & Plan Note (Signed)
Chronic Sugars well controlled at home Check A1c Continue Metformin 500 mg twice daily, Victoza 1.8 mg daily

## 2020-04-11 LAB — IRON,TIBC AND FERRITIN PANEL
%SAT: 26 % (calc) (ref 16–45)
Ferritin: 45 ng/mL (ref 16–288)
Iron: 100 ug/dL (ref 45–160)
TIBC: 381 mcg/dL (calc) (ref 250–450)

## 2020-04-17 ENCOUNTER — Ambulatory Visit: Payer: Medicare PPO

## 2020-04-19 ENCOUNTER — Other Ambulatory Visit: Payer: Self-pay | Admitting: Internal Medicine

## 2020-04-19 ENCOUNTER — Other Ambulatory Visit (INDEPENDENT_AMBULATORY_CARE_PROVIDER_SITE_OTHER): Payer: Self-pay | Admitting: Family Medicine

## 2020-04-19 DIAGNOSIS — E119 Type 2 diabetes mellitus without complications: Secondary | ICD-10-CM

## 2020-04-26 ENCOUNTER — Encounter: Payer: Self-pay | Admitting: Internal Medicine

## 2020-04-26 DIAGNOSIS — M545 Low back pain, unspecified: Secondary | ICD-10-CM

## 2020-04-26 MED ORDER — HYDROCODONE-ACETAMINOPHEN 5-325 MG PO TABS: 1.0000 | ORAL_TABLET | Freq: Every day | ORAL | 0 refills | Status: DC

## 2020-05-29 DIAGNOSIS — G2581 Restless legs syndrome: Secondary | ICD-10-CM | POA: Diagnosis not present

## 2020-05-29 DIAGNOSIS — G4733 Obstructive sleep apnea (adult) (pediatric): Secondary | ICD-10-CM | POA: Diagnosis not present

## 2020-05-31 ENCOUNTER — Encounter: Payer: Self-pay | Admitting: Internal Medicine

## 2020-05-31 DIAGNOSIS — M545 Low back pain, unspecified: Secondary | ICD-10-CM

## 2020-05-31 MED ORDER — HYDROCODONE-ACETAMINOPHEN 5-325 MG PO TABS: 1.0000 | ORAL_TABLET | Freq: Every day | ORAL | 0 refills | Status: DC

## 2020-06-03 ENCOUNTER — Encounter: Payer: Self-pay | Admitting: Internal Medicine

## 2020-06-03 ENCOUNTER — Other Ambulatory Visit: Payer: Self-pay

## 2020-06-03 ENCOUNTER — Ambulatory Visit (INDEPENDENT_AMBULATORY_CARE_PROVIDER_SITE_OTHER): Payer: Medicare PPO | Admitting: Internal Medicine

## 2020-06-03 VITALS — BP 152/90 | HR 105 | Temp 98.0°F | Ht 66.0 in | Wt 216.2 lb

## 2020-06-03 DIAGNOSIS — R600 Localized edema: Secondary | ICD-10-CM | POA: Diagnosis not present

## 2020-06-03 DIAGNOSIS — I10 Essential (primary) hypertension: Secondary | ICD-10-CM | POA: Diagnosis not present

## 2020-06-03 MED ORDER — AMLODIPINE BESYLATE 5 MG PO TABS: 5.0000 mg | ORAL_TABLET | Freq: Every day | ORAL | 1 refills | Status: DC

## 2020-06-03 MED ORDER — FUROSEMIDE 20 MG PO TABS
40.0000 mg | ORAL_TABLET | Freq: Every day | ORAL | Status: DC
Start: 2020-06-03 — End: 2020-07-02

## 2020-06-03 NOTE — Patient Instructions (Addendum)
    Medications changes include :   Start amlodipine 5 mg daily  Your prescription(s) have been submitted to your pharmacy. Please take as directed and contact our office if you believe you are having problem(s) with the medication(s).  [name]

## 2020-06-03 NOTE — Progress Notes (Signed)
Subjective:    Patient ID: Erin Roach, female    DOB: [birth year], 80 y.o.   MRN: [id number]  HPI The patient is here for an acute visit.   BP issues:  Friday she went to her RLS doctor and her BP was elevated - it is usually low.  Her BP was 154/97 and 159/87.    No change in medications.  She took an anti-histamine x 2.  She started taking CBD.  After starting this she was dizzy for a couple of nights and that would go away.    Exercising regularly on her exercise bike.  She admits she is probably not doing as much as she should.  Medications and allergies reviewed with patient and updated if appropriate.  Patient Active Problem List   Diagnosis Date Noted  . Anemia 04/09/2020  . Carotid arterial disease ([place]) 01/23/2019  . RBBB (right bundle branch block with left anterior fascicular block)   . Chest pain 01/22/2019  . Weakness of both lower extremities 01/02/2019  . Frequent falls 01/02/2019  . OSA on CPAP 06/15/2017  . Diabetes ([place]) 03/02/2017  . Herpes zoster without complication 11/65/7903  . B12 nutritional deficiency 02/23/2017  . Vitamin D deficiency 12/08/2016  . Tubular adenoma of colon 04/29/2016  . Essential hypertension 09/12/2015  . Allergic rhinitis 07/17/2015  . Insomnia 07/17/2015  . Obese 04/10/2015  . S/P left TKA 04/09/2015  . Osteopenia 12/04/2012  . 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 RESTLESS LEG SYNDROME 02/11/2009  . LEG EDEMA, BILATERAL 02/11/2009  . Hyperlipidemia 10/30/2008  . Urinary incontinence 10/30/2008  . Skin cancer 10/30/2008  . Asthma 11/21/2007  . Venous (peripheral) insufficiency 11/22/2006    Current Outpatient Medications on File Prior to Visit  Medication Sig Dispense Refill  . ACCU-CHEK GUIDE test strip TEST TWICE A DAY 100 strip 0  . amitriptyline (ELAVIL) 50 MG tablet Take 1 tablet (50 mg total) by mouth at bedtime.    . Blood Glucose Monitoring Suppl (ONE TOUCH ULTRA 2) w/Device KIT USE TO TEST BLOOD SUGARS UP TO 4  TIMES DAILY 1 each 0  . budesonide-formoterol (SYMBICORT) 160-4.5 MCG/ACT inhaler Inhale 2 puffs into the lungs daily. 1 Inhaler 5  . ergocalciferol (VITAMIN D2) 1.25 MG (50000 UT) capsule Take 1 capsule (50,000 Units total) by mouth once a week. Take one tablet by mouth on Mondays 12 capsule 0  . fluticasone (FLONASE) 50 MCG/ACT nasal spray Place 1 spray into both nostrils daily as needed for allergies. 11.1 mL 0  . furosemide (LASIX) 20 MG tablet TAKE ONE TABLET BY MOUTH DAILY (Patient taking differently: Take 40 mg by mouth daily.) 90 tablet 1  . HORIZANT 600 MG TBCR Take 1 tablet by mouth at bedtime.     [name] HYDROcodone-acetaminophen (NORCO/VICODIN) 5-325 MG tablet Take 1 tablet by mouth at bedtime. 30 tablet 0  . Lancets (ONETOUCH ULTRASOFT) lancets Use as instructed 100 each 0  . liraglutide (VICTOZA) 18 MG/3ML SOPN DIAL AND INJECT SUBCUTANEOUSLY 1.8MG DAILY EVERY MORNING 9 mL 0  . losartan (COZAAR) 100 MG tablet TAKE ONE TABLET BY MOUTH DAILY. 90 tablet 1  . metFORMIN (GLUCOPHAGE) 500 MG tablet TAKE 1 TABLET BY MOUTH TWICE A DAY WITH A MEAL. 180 tablet 1  . pramipexole (MIRAPEX) 0.25 MG tablet Take 0.25 mg by mouth at bedtime.    . pregabalin (LYRICA) 75 MG capsule TAKE ONE CAPSULE BY MOUTH EVERY AFTERNOON AND TAKE TWO CAPSULES BY MOUTH EVERY NIGHT AT BEDTIME 270 capsule  0  . rOPINIRole (REQUIP) 0.5 MG tablet Take 1 tablet (0.5 mg total) by mouth at bedtime. 60 tablet 1  . simvastatin (ZOCOR) 40 MG tablet TAKE ONE TABLET BY MOUTH EVERY NIGHT AT BEDTIME 90 tablet 1   No current facility-administered medications on file prior to visit.    Past Medical History:  Diagnosis Date  . Allergic rhinitis   . Apnea   . Arthritis   . Asthma    very mild  . Back pain   . Bilateral lower extremity edema   . Carotid artery disease ([place])    a. Carotid duplex 03/2016 - duplex was stable, 68-34% RICA, 1-96% LICA  . Complication of anesthesia    post-op delirium  . Endometrial polyp   . Essential  hypertension   . GERD (gastroesophageal reflux disease)   . Heart murmur    per pt  . History of adenomatous polyp of colon    04-24-2016  tubular adenoma  . History of squamous cell carcinoma excision    pilonidal area second excision 02-02-2002  . HTN (hypertension)   . Hyperlipidemia   . Leg pain   . OAB (overactive bladder)   . RA (rheumatoid arthritis) ([place])   . RBBB (right bundle branch block with left anterior fascicular block)   . RLS (restless legs syndrome)   . Shortness of breath on exertion   . SUI (stress urinary incontinence, female)   . Venous insufficiency    legs  . Vitamin D deficiency     Past Surgical History:  Procedure Laterality Date  . APPENDECTOMY  07-02-2005   [name]   open  . CARDIAC CATHETERIZATION  01-23-2009  dr Darnell Level brodie   normal coronary arteries and lvf  . CARDIOVASCULAR STRESS TEST  03/31/2016   Low risk nuclear study w/ medium defect of mild severity in basal anterior and mid anterior location w/ no evidence ischemia or infarction/  normal LV function and wall motion,  nuclear stress ef 71%  . CATARACT EXTRACTION W/ INTRAOCULAR LENS IMPLANT Left 09/2016  . COLONOSCOPY  last one 04-24-2016  . HYSTEROSCOPY WITH D & C N/A 12/22/2016   Procedure: DILATATION AND CURETTAGE /HYSTEROSCOPY;  Surgeon: Dian Queen, MD;  Location: [facility];  Service: Gynecology;  Laterality: N/A;  . IR US GUIDANCE  09/08/2016  . POSTERIOR LUMBAR FUSION  02/ 2018    [facility] ([place], [place])  . SKIN CANCER EXCISION    . TONSILLECTOMY  child  . 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 TRANSTHORACIC ECHOCARDIOGRAM  01/21/2009   mild LVH, ef 22-29%, grade 1 diastolic dysfunction  . ULNAR NERVE TRANSPOSITION Left 12-20-2008  dr sypher   decompression and partial resection medial triceps fascia  . WIDE  LOCAL EXCISION PILONIDAL AREA  02-02-2002  [name]   recurrent squamous cell carcinoma in situ    Social History   Socioeconomic History  . Marital status: Married    Spouse name: Clare Gandy  . Number of children: 2  . Years of education: Not on file  . Highest education level: Not on file  Occupational History  . Occupation: retired Pharmacist, hospital  Tobacco Use  . Smoking status: Former Smoker    Packs/day: 0.50    Years: 10.00    Pack years: 5.00    Types: Cigarettes    Quit date: 12/10/1973  Years since quitting: 46.5  . Smokeless tobacco: Never Used  Vaping Use  . Vaping Use: Never used  Substance and Sexual Activity  . Alcohol use: Yes    Comment: occasional  . Drug use: No  . Sexual activity: Not on file  Other Topics Concern  . Not on file  Social History Narrative   2 children ages 12,35   Social Determinants of Health   Financial Resource Strain: Not on file  Food Insecurity: Not on file  Transportation Needs: Not on file  Physical Activity: Not on file  Stress: Not on file  Social Connections: Not on file    Family History  Problem Relation Age of Onset  . Esophageal cancer Father   . Heart disease Father        Unknown what kind  . Hyperlipidemia Father   . Hypertension Father   . Cancer Father   . Heart failure Father   . Hyperlipidemia Mother   . Hypertension Mother   . CAD Sister        33 years younger than patient - had stent. Was told that her anorexia/bulimia may have played a role  . Stroke Maternal Grandmother 66  . Diabetes Maternal Grandmother   . Heart attack Maternal Grandfather 93  . COPD Maternal Grandfather   . Heart attack Paternal Grandmother        48s  . Diabetes Other        MGGM    Review of Systems  Constitutional: Negative for fever.  Respiratory: Negative for cough, shortness of breath and wheezing.   Cardiovascular: Positive for leg swelling. Negative for chest pain and palpitations.  Neurological: Positive for  dizziness. Negative for light-headedness and headaches.       Objective:   Vitals:   06/03/20 1358  BP: (!) 152/90  Pulse: (!) 105  Temp: 98 F (36.7 C)  SpO2: 99%   BP Readings from Last 3 Encounters:  06/03/20 (!) 152/90  04/10/20 140/82  03/08/20 (!) 156/76   Wt Readings from Last 3 Encounters:  06/03/20 216 lb 3.2 oz (98.1 kg)  04/10/20 207 lb (93.9 kg)  03/08/20 210 lb (95.3 kg)   Body mass index is 34.9 kg/m.   Physical Exam    Constitutional: Appears well-developed and well-nourished. No distress.  Head: Normocephalic and atraumatic.  Neck: Neck supple. No tracheal deviation present. No thyromegaly present.  No cervical lymphadenopathy Cardiovascular: Normal rate, regular rhythm and normal heart sounds.  No murmur heard. No carotid bruit .  No edema Pulmonary/Chest: Effort normal and breath sounds normal. No respiratory distress. No has no wheezes. No rales.  Skin: Skin is warm and dry. Not diaphoretic.  Psychiatric: Normal mood and affect. Behavior is normal.       Assessment & Plan:    See Problem List for Assessment and Plan of chronic medical problems.    This visit occurred during the SARS-CoV-2 public health emergency.  Safety protocols were in place, including screening questions prior to the visit, additional usage of staff PPE, and extensive cleaning of exam room while observing appropriate contact time as indicated for disinfecting solutions.

## 2020-06-03 NOTE — Assessment & Plan Note (Addendum)
Chronic BP not controlled-no obvious cause for why blood pressure is more elevated recently, but discussed that we need to treat no matter what the causes Continue losartan 100 mg daily, Lasix 40 mg daily Start amlodipine 5 mg daily-I do not think this will make her leg swelling worse, but if it does she will let me know.  If it does we will need to consider something like Bystolic if covered by insurance She will monitor her blood pressure at home and update me with her readings

## 2020-06-03 NOTE — Assessment & Plan Note (Signed)
Chronic She does have 1+ swelling in her legs and they are quite tight Need to get her BP better controlled-we will add amlodipine, but hopefully this will not increase her leg swelling if it does we will discontinue it She is taking Lasix 40 mg daily-prescribed by her gynecologist-continue this.  I do not want to increase this Her Horizant and Lyrica are likely contributing to her edema along with her venous insufficiency Continue regular exercise Continue wearing compression socks daily Continue low-sodium diet Continue elevating legs when sitting She is using a wedge pillow at night

## 2020-06-04 ENCOUNTER — Encounter: Payer: Self-pay | Admitting: Internal Medicine

## 2020-06-04 ENCOUNTER — Telehealth: Payer: Medicare PPO | Admitting: Physician Assistant

## 2020-06-06 ENCOUNTER — Encounter: Payer: Self-pay | Admitting: Internal Medicine

## 2020-06-06 DIAGNOSIS — I10 Essential (primary) hypertension: Secondary | ICD-10-CM

## 2020-06-06 MED ORDER — SPIRONOLACTONE 25 MG PO TABS: 12.5000 mg | ORAL_TABLET | Freq: Every day | ORAL | 1 refills | Status: DC

## 2020-06-09 ENCOUNTER — Other Ambulatory Visit: Payer: Self-pay | Admitting: Internal Medicine

## 2020-06-09 DIAGNOSIS — E119 Type 2 diabetes mellitus without complications: Secondary | ICD-10-CM

## 2020-06-10 ENCOUNTER — Encounter: Payer: Self-pay | Admitting: Internal Medicine

## 2020-06-16 ENCOUNTER — Encounter: Payer: Self-pay | Admitting: Internal Medicine

## 2020-06-16 DIAGNOSIS — M545 Low back pain, unspecified: Secondary | ICD-10-CM

## 2020-06-17 ENCOUNTER — Other Ambulatory Visit: Payer: Self-pay | Admitting: Internal Medicine

## 2020-06-17 MED ORDER — SPIRONOLACTONE 25 MG PO TABS: 25.0000 mg | ORAL_TABLET | Freq: Every day | ORAL | 1 refills | Status: DC

## 2020-07-01 NOTE — Addendum Note (Signed)
Addended by: Binnie Rail on: 07/01/2020 08:59 PM   Modules accepted: Orders

## 2020-07-02 ENCOUNTER — Ambulatory Visit: Payer: Medicare PPO | Admitting: Physician Assistant

## 2020-07-02 ENCOUNTER — Other Ambulatory Visit: Payer: Self-pay

## 2020-07-02 VITALS — BP 160/80 | HR 95 | Ht 66.5 in | Wt 211.0 lb

## 2020-07-02 DIAGNOSIS — R600 Localized edema: Secondary | ICD-10-CM

## 2020-07-02 DIAGNOSIS — E785 Hyperlipidemia, unspecified: Secondary | ICD-10-CM

## 2020-07-02 DIAGNOSIS — I10 Essential (primary) hypertension: Secondary | ICD-10-CM | POA: Diagnosis not present

## 2020-07-02 DIAGNOSIS — I6523 Occlusion and stenosis of bilateral carotid arteries: Secondary | ICD-10-CM | POA: Diagnosis not present

## 2020-07-02 DIAGNOSIS — I453 Trifascicular block: Secondary | ICD-10-CM

## 2020-07-02 DIAGNOSIS — G2581 Restless legs syndrome: Secondary | ICD-10-CM | POA: Diagnosis not present

## 2020-07-02 DIAGNOSIS — E119 Type 2 diabetes mellitus without complications: Secondary | ICD-10-CM

## 2020-07-02 MED ORDER — HYDRALAZINE HCL 25 MG PO TABS: 25.0000 mg | ORAL_TABLET | Freq: Three times a day (TID) | ORAL | 2 refills | Status: DC

## 2020-07-02 MED ORDER — HYDROCODONE-ACETAMINOPHEN 5-325 MG PO TABS: 1.0000 | ORAL_TABLET | Freq: Every day | ORAL | 0 refills | Status: DC

## 2020-07-02 MED ORDER — FUROSEMIDE 20 MG PO TABS: 40.0000 mg | ORAL_TABLET | Freq: Every day | ORAL | 3 refills | Status: DC

## 2020-07-02 NOTE — Patient Instructions (Signed)
Medication Instructions:   START Hydralazine 25 mg 3 times a day. If after 2 weeks your top number of your blood pressure is greater than 140, INCREASE Hydralazine to 50 mg 3 times a day.   INCREASE Lasix to 40 mg 2 times a day for 3 days, then resume Lasix 40 mg daily  *If you need a refill on your cardiac medications before your next appointment, please call your pharmacy*  Lab Work: NONE ordered at this time of appointment   If you have labs (blood work) drawn today and your tests are completely normal, you will receive your results only by: [name] MyChart Message (if you have MyChart) OR . A paper copy in the mail If you have any lab test that is abnormal or we need to change your treatment, we will call you to review the results.  Testing/Procedures: NONE ordered at this time of appointment   Follow-Up: At [facility], you and your health needs are our priority.  As part of our continuing mission to provide you with exceptional heart care, we have created designated Provider Care Teams.  These Care Teams include your primary Cardiologist (physician) and Advanced Practice Providers (APPs -  Physician Assistants and Nurse Practitioners) who all work together to provide you with the care you need, when you need it.  We recommend signing up for the patient portal called "MyChart".  Sign up information is provided on this After Visit Summary.  MyChart is used to connect with patients for Virtual Visits (Telemedicine).  Patients are able to view lab/test results, encounter notes, upcoming appointments, etc.  Non-urgent messages can be sent to your provider as well.   To learn more about what you can do with MyChart, go to [email or web address].    Your next appointment:   3-4 week(s) 3-4 month(s)  The format for your next appointment:   In Person In Person  Provider:   Almyra Deforest, PA-C Sanda Klein, MD   Other Instructions  CONTINUE to monitor your blood pressure at home.

## 2020-07-02 NOTE — Addendum Note (Signed)
Addended by: Binnie Rail on: 07/02/2020 05:12 AM   Modules accepted: Orders

## 2020-07-02 NOTE — Progress Notes (Signed)
Cardiology Office Note:    Date:  07/03/2020   ID:  THAYER INABINET, DOB Mar 06, 1941, MRN [id number]  PCP:  Binnie Rail, MD   [facility]  Cardiologist:  Sanda Klein, MD  Advanced Practice Provider:  No care team member to display Electrophysiologist:  None    Referring MD: Binnie Rail, MD   Chief Complaint  Patient presents with  . Follow-up    Seen for Dr. Sallyanne Kuster    History of Present Illness:    Erin Roach is a 80 y.o. female with a hx of PAD, HTN, HLD, DM II, obesity, RLS, OSA and chronic LE edema.  She had a reported history of normal cardiac catheterization in 2010.  Myoview in 2019 was low risk with no reversible ischemia, EF 71%, basal mid anterior attenuation artifact.  Patient was hospitalized in September 2020 with chest pain, cardiac enzyme was negative.  EKG showed no acute changes. Carotid doppler obtained in September 2020 showed 40 to 59% right ICA stenosis, 1 to 39% left ICA stenosis.  Echocardiogram obtained on the same day showed EF greater than 65%, impaired relaxation of the LV, mild LAE, trivial MR.  She has a history of right bundle branch block and left anterior fascicular block.  She was last seen by Dr. Sallyanne Kuster on 04/13/2019 at which time she was doing well.  Hydralazine 25 mg 3 times a day was added recently due to elevated blood pressure.  ABI obtained in December 2020 showed triphasic blood flow throughout both left and right leg, there was no sign PAD  Patient presents today for follow-up on high blood pressure.  Based on recent patient messages, her blood pressure has been ranging in the 130-190s range.  She has not picked up the hydralazine yet.  I urged her to start on hydralazine on top of her losartan.  Given her underlying right bundle branch block with left anterior fascicular block, we want to avoid any kind of beta-blocker or nondihydropyridine calcium channel blocker which can cause worsening conduction disorder.   She has chronic lower extremity edema, I wish to avoid dihydropyridine calcium channel blocker such as amlodipine.  She does mention her lower extremity edema has worsened in the past several months, on my exam, lower extremity edema appears to be nonpitting in nature.  I recommended more conservative management such as leg elevation and compression stocking.  I do not recommend permanently increase diuretic at this time.  She may take higher dose of Lasix at 40 mg twice daily for 3 days before going back down to 40 mg daily thereafter.  She does have history of left hip pain, this appears to be more arthritic in nature rather than PAD.  Previous ABI in December 2020 showed normal blood flow and no blockages.  She will reassess her blood pressure in 2 weeks if systolic blood pressure is still persistently greater than 140s, she may increase hydralazine to 50 mg 3 times daily dosing.  I plan to see the patient back in 3 to 4 weeks and she can see Dr. Sallyanne Kuster in 3 months.  Past Medical History:  Diagnosis Date  . Allergic rhinitis   . Apnea   . Arthritis   . Asthma    very mild  . Back pain   . Bilateral lower extremity edema   . Carotid artery disease ([place])    a. Carotid duplex 03/2016 - duplex was stable, 62-70% RICA, 3-50% LICA  . Complication of  anesthesia    post-op delirium  . Endometrial polyp   . Essential hypertension   . GERD (gastroesophageal reflux disease)   . Heart murmur    per pt  . History of adenomatous polyp of colon    04-24-2016  tubular adenoma  . History of squamous cell carcinoma excision    pilonidal area second excision 02-02-2002  . HTN (hypertension)   . Hyperlipidemia   . Leg pain   . OAB (overactive bladder)   . RA (rheumatoid arthritis) ([place])   . RBBB (right bundle branch block with left anterior fascicular block)   . RLS (restless legs syndrome)   . Shortness of breath on exertion   . SUI (stress urinary incontinence, female)   . Venous insufficiency     legs  . Vitamin D deficiency     Past Surgical History:  Procedure Laterality Date  . APPENDECTOMY  07-02-2005   [name]   open  . CARDIAC CATHETERIZATION  01-23-2009  dr Darnell Level brodie   normal coronary arteries and lvf  . CARDIOVASCULAR STRESS TEST  03/31/2016   Low risk nuclear study w/ medium defect of mild severity in basal anterior and mid anterior location w/ no evidence ischemia or infarction/  normal LV function and wall motion,  nuclear stress ef 71%  . CATARACT EXTRACTION W/ INTRAOCULAR LENS IMPLANT Left 09/2016  . COLONOSCOPY  last one 04-24-2016  . HYSTEROSCOPY WITH D & C N/A 12/22/2016   Procedure: DILATATION AND CURETTAGE /HYSTEROSCOPY;  Surgeon: Dian Queen, MD;  Location: [facility];  Service: Gynecology;  Laterality: N/A;  . IR US GUIDANCE  09/08/2016  . POSTERIOR LUMBAR FUSION  02/ 2018    [facility] ([place])  . SKIN CANCER EXCISION    . TONSILLECTOMY  child  . 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 TRANSTHORACIC ECHOCARDIOGRAM  01/21/2009   mild LVH, ef 38-45%, grade 1 diastolic dysfunction  . ULNAR NERVE TRANSPOSITION Left 12-20-2008  dr sypher   decompression and partial resection medial triceps fascia  . WIDE LOCAL EXCISION PILONIDAL AREA  02-02-2002  [name]   recurrent squamous cell carcinoma in situ    Current Medications: Current Meds  Medication Sig  . ACCU-CHEK GUIDE test strip TEST TWICE A DAY  . amitriptyline (ELAVIL) 50 MG tablet Take 1 tablet (50 mg total) by mouth at bedtime.  . Blood Glucose Monitoring Suppl (ONE TOUCH ULTRA 2) w/Device KIT USE TO TEST BLOOD SUGARS UP TO 4 TIMES DAILY  . budesonide-formoterol (SYMBICORT) 160-4.5 MCG/ACT inhaler Inhale 2 puffs into the lungs daily.  . ergocalciferol (VITAMIN D2) 1.25 MG (50000 UT) capsule Take 1 capsule (50,000 Units  total) by mouth once a week. Take one tablet by mouth on Mondays  . fluticasone (FLONASE) 50 MCG/ACT nasal spray Place 1 spray into both nostrils daily as needed for allergies.  [name] HORIZANT 600 MG TBCR Take 1 tablet by mouth at bedtime.   . hydrALAZINE (APRESOLINE) 25 MG tablet Take 1 tablet (25 mg total) by mouth 3 (three) times daily.  [name] HYDROcodone-acetaminophen (NORCO/VICODIN) 5-325 MG tablet Take 1 tablet by mouth at bedtime.  . Lancets (ONETOUCH ULTRASOFT) lancets Use as instructed  . liraglutide (VICTOZA) 18 MG/3ML SOPN DIAL AND INJECT SUBCUTANEOUSLY 1.8MG DAILY EVERY MORNING  . losartan (COZAAR) 100 MG tablet TAKE ONE TABLET BY MOUTH DAILY  . metFORMIN (  GLUCOPHAGE) 500 MG tablet TAKE 1 TABLET BY MOUTH TWICE A DAY WITH MEALS  . pramipexole (MIRAPEX) 0.25 MG tablet Take 0.25 mg by mouth at bedtime.  . pregabalin (LYRICA) 75 MG capsule TAKE ONE CAPSULE BY MOUTH EVERY AFTERNOON AND TAKE TWO CAPSULES BY MOUTH EVERY NIGHT AT BEDTIME  . rOPINIRole (REQUIP) 0.5 MG tablet Take 1 tablet (0.5 mg total) by mouth at bedtime.  . simvastatin (ZOCOR) 40 MG tablet TAKE ONE TABLET BY MOUTH EVERY NIGHT AT BEDTIME  . spironolactone (ALDACTONE) 25 MG tablet Take 1 tablet (25 mg total) by mouth daily.  . [DISCONTINUED] furosemide (LASIX) 20 MG tablet Take 2 tablets (40 mg total) by mouth daily.     Allergies:   Dust mite extract   Social History   Socioeconomic History  . Marital status: Married    Spouse name: Clare Gandy  . Number of children: 2  . Years of education: Not on file  . Highest education level: Not on file  Occupational History  . Occupation: retired Pharmacist, hospital  Tobacco Use  . Smoking status: Former Smoker    Packs/day: 0.50    Years: 10.00    Pack years: 5.00    Types: Cigarettes    Quit date: 12/10/1973    Years since quitting: 46.5  . Smokeless tobacco: Never Used  Vaping Use  . Vaping Use: Never used  Substance and Sexual Activity  . Alcohol use: Yes    Comment: occasional  . Drug  use: No  . Sexual activity: Not on file  Other Topics Concern  . Not on file  Social History Narrative   2 children ages 28,35   Social Determinants of Health   Financial Resource Strain: Not on file  Food Insecurity: Not on file  Transportation Needs: Not on file  Physical Activity: Not on file  Stress: Not on file  Social Connections: Not on file     Family History: The patient's family history includes CAD in her sister; COPD in her maternal grandfather; Cancer in her father; Diabetes in her maternal grandmother and another family member; Esophageal cancer in her father; Heart attack in her paternal grandmother; Heart attack (age of onset: 29) in her maternal grandfather; Heart disease in her father; Heart failure in her father; Hyperlipidemia in her father and mother; Hypertension in her father and mother; Stroke (age of onset: 62) in her maternal grandmother.  ROS:   Please see the history of present illness.     All other systems reviewed and are negative.  EKGs/Labs/Other Studies Reviewed:    The following studies were reviewed today:  Echo 01/23/2019 1. The left ventricle has hyperdynamic systolic function, with an  ejection fraction of >65%. The cavity size was normal. There is mildly  increased left ventricular wall thickness. Left ventricular diastolic  Doppler parameters are consistent with  impaired relaxation. No evidence of left ventricular regional wall motion  abnormalities.  2. The right ventricle has normal systolic function. The cavity was  normal. There is no increase in right ventricular wall thickness.  3. Left atrial size was mildly dilated.  4. Trivial pericardial effusion is present.  5. There is mild to moderate mitral annular calcification present. No  evidence of mitral valve stenosis. Trivial mitral regurgitation.  6. The aortic valve is tricuspid. Mild calcification of the aortic valve.  No stenosis of the aortic valve.  7. The aortic  root is normal in size and structure.  8. The IVC is normal in size. No complete  TR doppler jet so unable to  estimate PA systolic pressure.   EKG:  EKG is ordered today.  The ekg ordered today demonstrates sinus rhythm with right bundle branch block and left anterior fascicular block  Recent Labs: 04/10/2020: ALT 39; BUN 21; Creatinine, Ser 0.98; Hemoglobin 12.1; Platelets 267.0; Potassium 4.7; Sodium 139  Recent Lipid Panel    Component Value Date/Time   CHOL 187 04/10/2020 1445   CHOL 199 07/03/2019 0844   TRIG 225.0 (H) 04/10/2020 1445   TRIG 101 05/19/2006 0832   HDL 54.70 04/10/2020 1445   HDL 53 07/03/2019 0844   CHOLHDL 3 04/10/2020 1445   VLDL 45.0 (H) 04/10/2020 1445   LDLCALC 109 (H) 07/03/2019 0844   LDLDIRECT 103.0 04/10/2020 1445     Risk Assessment/Calculations:       Physical Exam:    VS:  BP (!) 160/80   Pulse 95   Ht 5' 6.5" (1.689 m)   Wt 211 lb (95.7 kg)   BMI 33.55 kg/m     Wt Readings from Last 3 Encounters:  07/02/20 211 lb (95.7 kg)  06/03/20 216 lb 3.2 oz (98.1 kg)  04/10/20 207 lb (93.9 kg)     GEN:  Well nourished, well developed in no acute distress HEENT: Normal NECK: No JVD; No carotid bruits LYMPHATICS: No lymphadenopathy CARDIAC: RRR, no murmurs, rubs, gallops RESPIRATORY:  Clear to auscultation without rales, wheezing or rhonchi  ABDOMEN: Soft, non-tender, non-distended MUSCULOSKELETAL:  No edema; No deformity  SKIN: Warm and dry NEUROLOGIC:  Alert and oriented x 3 PSYCHIATRIC:  Normal affect   ASSESSMENT:    1. Essential hypertension   2. RBBB, anterior fascicular block and incomplete LBBB   3. Bilateral leg edema   4. Hyperlipidemia LDL goal <70   5. Controlled type 2 diabetes mellitus without complication, without long-term current use of insulin ([place])   6. Bilateral carotid artery stenosis    PLAN:    In order of problems listed above:  1. Hypertension: Blood pressure has been elevated recently, she was given a  prescription for hydralazine 25 mg 3 times a day, however she has not started on this medication yet.  I recommend proceed with addition of hydralazine, if systolic blood pressure still greater than 140 mmHg after 2 weeks, she may increase hydralazine to 50 mg 3 times a day.  2. Right bundle branch block with left anterior fascicular block: Avoid any AV nodal blocking agent which may precipitate conduction disease.  3. Bilateral leg edema: She says her leg edema has worsened recently.  Appears to be nonpitting in nature and chronic.  I recommend she increase Lasix for 3 days before going back to the lower dose.  Continue conservative management such as leg elevation and weight loss  4. Hyperlipidemia: On Zocor  5. DM2: Managed by primary care provider  6. Bilateral carotid artery disease: Consider repeat study on the next visit  7. Restless leg syndrome: Managed by specialist in [place].        Medication Adjustments/Labs and Tests Ordered: Current medicines are reviewed at length with the patient today.  Concerns regarding medicines are outlined above.  Orders Placed This Encounter  Procedures  . EKG 12-Lead   Meds ordered this encounter  Medications  . furosemide (LASIX) 20 MG tablet    Sig: Take 2 tablets (40 mg total) by mouth daily.    Dispense:  180 tablet    Refill:  3    Patient Instructions  Medication Instructions:  START Hydralazine 25 mg 3 times a day. If after 2 weeks your top number of your blood pressure is greater than 140, INCREASE Hydralazine to 50 mg 3 times a day.   INCREASE Lasix to 40 mg 2 times a day for 3 days, then resume Lasix 40 mg daily  *If you need a refill on your cardiac medications before your next appointment, please call your pharmacy*  Lab Work: NONE ordered at this time of appointment   If you have labs (blood work) drawn today and your tests are completely normal, you will receive your results only by: [name] MyChart Message (if  you have MyChart) OR . A paper copy in the mail If you have any lab test that is abnormal or we need to change your treatment, we will call you to review the results.  Testing/Procedures: NONE ordered at this time of appointment   Follow-Up: At [facility], you and your health needs are our priority.  As part of our continuing mission to provide you with exceptional heart care, we have created designated Provider Care Teams.  These Care Teams include your primary Cardiologist (physician) and Advanced Practice Providers (APPs -  Physician Assistants and Nurse Practitioners) who all work together to provide you with the care you need, when you need it.  We recommend signing up for the patient portal called "MyChart".  Sign up information is provided on this After Visit Summary.  MyChart is used to connect with patients for Virtual Visits (Telemedicine).  Patients are able to view lab/test results, encounter notes, upcoming appointments, etc.  Non-urgent messages can be sent to your provider as well.   To learn more about what you can do with MyChart, go to [email or web address].    Your next appointment:   3-4 week(s) 3-4 month(s)  The format for your next appointment:   In Person In Person  Provider:   Almyra Deforest, PA-C Sanda Klein, MD   Other Instructions  CONTINUE to monitor your blood pressure at home.      Hilbert Corrigan, [place]  07/03/2020 10:46 PM    [facility]

## 2020-07-03 ENCOUNTER — Encounter: Payer: Self-pay | Admitting: Physician Assistant

## 2020-07-24 DIAGNOSIS — B351 Tinea unguium: Secondary | ICD-10-CM | POA: Diagnosis not present

## 2020-07-24 DIAGNOSIS — I739 Peripheral vascular disease, unspecified: Secondary | ICD-10-CM | POA: Diagnosis not present

## 2020-07-25 ENCOUNTER — Telehealth: Payer: Self-pay | Admitting: Cardiovascular Disease

## 2020-07-25 MED ORDER — HYDRALAZINE HCL 50 MG PO TABS: 50.0000 mg | ORAL_TABLET | Freq: Three times a day (TID) | ORAL | 6 refills | Status: DC

## 2020-07-25 NOTE — Telephone Encounter (Signed)
Mail box full unable to leave message Will try later ./cy Not sure if pt has been without med for any days or not . Script sent in for 50 mg

## 2020-07-25 NOTE — Telephone Encounter (Signed)
*  STAT* If patient is at the pharmacy, call can be transferred to refill team.   1. Which medications need to be refilled? (please list name of each medication and dose if known) hydrALAZINE (APRESOLINE) 25 MG tablet  2. Which pharmacy/location (including street and city if local pharmacy) is medication to be sent to? [facility] [address], [place] [address]  3. Do they need a 30 day or 90 day supply? [place] increased medication to 2 tablets 3x daily but now the patient has ran out and her BP has been elevated.

## 2020-07-26 NOTE — Telephone Encounter (Signed)
Pt aware called on home phone cell voicemail is full . Per pt just started the increase last night. Will monitor B/P .Adonis Housekeeper

## 2020-07-30 MED ORDER — HYDROCODONE-ACETAMINOPHEN 5-325 MG PO TABS: 1.0000 | ORAL_TABLET | Freq: Every day | ORAL | 0 refills | Status: DC

## 2020-07-30 NOTE — Addendum Note (Signed)
Addended by: Binnie Rail on: 07/30/2020 05:10 AM   Modules accepted: Orders

## 2020-08-06 ENCOUNTER — Encounter: Payer: Self-pay | Admitting: Physician Assistant

## 2020-08-06 ENCOUNTER — Ambulatory Visit: Payer: Medicare PPO | Admitting: Physician Assistant

## 2020-08-06 ENCOUNTER — Other Ambulatory Visit: Payer: Self-pay

## 2020-08-06 VITALS — BP 134/70 | HR 108 | Ht 66.5 in | Wt 210.0 lb

## 2020-08-06 DIAGNOSIS — E785 Hyperlipidemia, unspecified: Secondary | ICD-10-CM | POA: Diagnosis not present

## 2020-08-06 DIAGNOSIS — I10 Essential (primary) hypertension: Secondary | ICD-10-CM

## 2020-08-06 DIAGNOSIS — E119 Type 2 diabetes mellitus without complications: Secondary | ICD-10-CM | POA: Diagnosis not present

## 2020-08-06 DIAGNOSIS — I6523 Occlusion and stenosis of bilateral carotid arteries: Secondary | ICD-10-CM

## 2020-08-06 MED ORDER — HYDRALAZINE HCL 100 MG PO TABS: 100.0000 mg | ORAL_TABLET | Freq: Three times a day (TID) | ORAL | 3 refills | Status: DC

## 2020-08-06 NOTE — Progress Notes (Signed)
Cardiology Office Note:    Date:  08/08/2020   ID:  SABRIN Roach, DOB 08/01/1940, MRN [id number]  PCP:  Binnie Rail, MD   [place] [facility]  Cardiologist:  Sanda Klein, MD  Advanced Practice Provider:  No care team member to display Electrophysiologist:  None   Referring MD: Binnie Rail, MD   Chief Complaint  Patient presents with  . Follow-up    Seen for Dr. Sallyanne Kuster    History of Present Illness:    Erin Roach is a 80 y.o. female with a hx of PAD, HTN, HLD, DM II, obesity, RLS, OSA and chronic LE edema.  She had a reported history of normal cardiac catheterization in 2010.  Myoview in 2019 was low risk with no reversible ischemia, EF 71%, basal mid anterior attenuation artifact.  Patient was hospitalized in September 2020 with chest pain, cardiac enzyme was negative.  EKG showed no acute changes. Carotid doppler obtained in September 2020 showed 40 to 59% right ICA stenosis, 1 to 39% left ICA stenosis.  Echocardiogram obtained on the same day showed EF greater than 65%, impaired relaxation of the LV, mild LAE, trivial MR.  She has a history of right bundle branch block and left anterior fascicular block. ABI obtained in December 2020 showed triphasic blood flow throughout both left and right leg, there was no sign PAD  I last saw the patient on 07/02/2020, prior to that, she was started on the hydralazine, however she was still not taking the hydralazine on the last visit.  I urged her to start on the hydralazine immediately.  Due to her history of right bundle branch block and the left anterior fascicular block, I am concerned about adding on any AV nodal blocking agent.  Due to her chronic lower extremity edema, I wish to avoid dihydropyridine calcium channel blocker such as amlodipine.    Patient presents today for follow-up.  Blood pressure in office is 130s, however based on home blood pressure diary, her systolic blood pressure still somewhere  between 130-140s range.  She denies any chest pain but does have some shortness of breath with exertion.  I recommended increase hydralazine to 183m 3 times daily dosing.  If dyspnea on exertion continue to worsen, I would recommend a repeat echocardiogram.  Her heart rate is borderline elevated, however given bifascicular block, I am hesitant to add any AV nodal blocking agent which may worsen her conduction disease.  Past Medical History:  Diagnosis Date  . Allergic rhinitis   . Apnea   . Arthritis   . Asthma    very mild  . Back pain   . Bilateral lower extremity edema   . Carotid artery disease [place]    a. Carotid duplex 03/2016 - duplex was stable, 426-37%RICA, 18-58%LICA  . Complication of anesthesia    post-op delirium  . Endometrial polyp   . Essential hypertension   . GERD (gastroesophageal reflux disease)   . Heart murmur    per pt  . History of adenomatous polyp of colon    04-24-2016  tubular adenoma  . History of squamous cell carcinoma excision    pilonidal area second excision 02-02-2002  . HTN (hypertension)   . Hyperlipidemia   . Leg pain   . OAB (overactive bladder)   . RA (rheumatoid arthritis) ([place]   . RBBB (right bundle branch block with left anterior fascicular block)   . RLS (restless legs syndrome)   .  Shortness of breath on exertion   . SUI (stress urinary incontinence, female)   . Venous insufficiency    legs  . Vitamin D deficiency     Past Surgical History:  Procedure Laterality Date  . APPENDECTOMY  07-02-2005   [name]   open  . CARDIAC CATHETERIZATION  01-23-2009  dr Darnell Level brodie   normal coronary arteries and lvf  . CARDIOVASCULAR STRESS TEST  03/31/2016   Low risk nuclear study w/ medium defect of mild severity in basal anterior and mid anterior location w/ no evidence ischemia or infarction/  normal LV function and wall motion,  nuclear stress ef 71%  . CATARACT EXTRACTION W/ INTRAOCULAR LENS IMPLANT Left 09/2016  . COLONOSCOPY   last one 04-24-2016  . HYSTEROSCOPY WITH D & C N/A 12/22/2016   Procedure: DILATATION AND CURETTAGE /HYSTEROSCOPY;  Surgeon: Dian Queen, MD;  Location: [facility];  Service: Gynecology;  Laterality: N/A;  . IR US GUIDANCE  09/08/2016  . POSTERIOR LUMBAR FUSION  02/ 2018    [facility] ([place])  . SKIN CANCER EXCISION    . TONSILLECTOMY  child  . 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 TRANSTHORACIC ECHOCARDIOGRAM  01/21/2009   mild LVH, ef 76-16%, grade 1 diastolic dysfunction  . ULNAR NERVE TRANSPOSITION Left 12-20-2008  dr sypher   decompression and partial resection medial triceps fascia  . WIDE LOCAL EXCISION PILONIDAL AREA  02-02-2002  [name]   recurrent squamous cell carcinoma in situ    Current Medications: Current Meds  Medication Sig  . ACCU-CHEK GUIDE test strip TEST TWICE A DAY  . amitriptyline (ELAVIL) 50 MG tablet Take 1 tablet (50 mg total) by mouth at bedtime.  . Blood Glucose Monitoring Suppl (ONE TOUCH ULTRA 2) w/Device KIT USE TO TEST BLOOD SUGARS UP TO 4 TIMES DAILY  . budesonide-formoterol (SYMBICORT) 160-4.5 MCG/ACT inhaler Inhale 2 puffs into the lungs daily.  . ergocalciferol (VITAMIN D2) 1.25 MG (50000 UT) capsule Take 1 capsule (50,000 Units total) by mouth once a week. Take one tablet by mouth on Mondays  . fluticasone (FLONASE) 50 MCG/ACT nasal spray Place 1 spray into both nostrils daily as needed for allergies.  . furosemide (LASIX) 20 MG tablet Take 2 tablets (40 mg total) by mouth daily.  [name] HORIZANT 600 MG TBCR Take 1 tablet by mouth at bedtime.   [name] HYDROcodone-acetaminophen (NORCO/VICODIN) 5-325 MG tablet Take 1 tablet by mouth at bedtime.  . Lancets (ONETOUCH ULTRASOFT) lancets Use as instructed  . liraglutide (VICTOZA) 18 MG/3ML SOPN DIAL AND INJECT SUBCUTANEOUSLY 1.8MG DAILY EVERY  MORNING  . losartan (COZAAR) 100 MG tablet TAKE ONE TABLET BY MOUTH DAILY  . metFORMIN (GLUCOPHAGE) 500 MG tablet TAKE 1 TABLET BY MOUTH TWICE A DAY WITH MEALS  . pramipexole (MIRAPEX) 0.25 MG tablet Take 0.25 mg by mouth at bedtime.  . pregabalin (LYRICA) 75 MG capsule TAKE ONE CAPSULE BY MOUTH EVERY AFTERNOON AND TAKE TWO CAPSULES BY MOUTH EVERY NIGHT AT BEDTIME  . simvastatin (ZOCOR) 40 MG tablet TAKE ONE TABLET BY MOUTH EVERY NIGHT AT BEDTIME  . spironolactone (ALDACTONE) 25 MG tablet Take 1 tablet (25 mg total) by mouth daily.  . [DISCONTINUED] hydrALAZINE (APRESOLINE) 50 MG tablet Take 1 tablet (50 mg total) by mouth 3 (three) times daily.     Allergies:   Dust mite extract  Social History   Socioeconomic History  . Marital status: Married    Spouse name: Clare Gandy  . Number of children: 2  . Years of education: Not on file  . Highest education level: Not on file  Occupational History  . Occupation: retired Pharmacist, hospital  Tobacco Use  . Smoking status: Former Smoker    Packs/day: 0.50    Years: 10.00    Pack years: 5.00    Types: Cigarettes    Quit date: 12/10/1973    Years since quitting: 46.6  . Smokeless tobacco: Never Used  Vaping Use  . Vaping Use: Never used  Substance and Sexual Activity  . Alcohol use: Yes    Comment: occasional  . Drug use: No  . Sexual activity: Not on file  Other Topics Concern  . Not on file  Social History Narrative   2 children ages 72,35   Social Determinants of Health   Financial Resource Strain: Not on file  Food Insecurity: Not on file  Transportation Needs: Not on file  Physical Activity: Not on file  Stress: Not on file  Social Connections: Not on file     Family History: The patient's family history includes CAD in her sister; COPD in her maternal grandfather; Cancer in her father; Diabetes in her maternal grandmother and another family member; Esophageal cancer in her father; Heart attack in her paternal grandmother; Heart attack  (age of onset: 16) in her maternal grandfather; Heart disease in her father; Heart failure in her father; Hyperlipidemia in her father and mother; Hypertension in her father and mother; Stroke (age of onset: 52) in her maternal grandmother.  ROS:   Please see the history of present illness.     All other systems reviewed and are negative.  EKGs/Labs/Other Studies Reviewed:    The following studies were reviewed today:  Echo 01/23/2019 1. The left ventricle has hyperdynamic systolic function, with an  ejection fraction of >65%. The cavity size was normal. There is mildly  increased left ventricular wall thickness. Left ventricular diastolic  Doppler parameters are consistent with  impaired relaxation. No evidence of left ventricular regional wall motion  abnormalities.  2. The right ventricle has normal systolic function. The cavity was  normal. There is no increase in right ventricular wall thickness.  3. Left atrial size was mildly dilated.  4. Trivial pericardial effusion is present.  5. There is mild to moderate mitral annular calcification present. No  evidence of mitral valve stenosis. Trivial mitral regurgitation.  6. The aortic valve is tricuspid. Mild calcification of the aortic valve.  No stenosis of the aortic valve.  7. The aortic root is normal in size and structure.  8. The IVC is normal in size. No complete TR doppler jet so unable to  estimate PA systolic pressure.   EKG:  EKG is not ordered today.    Recent Labs: 04/10/2020: ALT 39; BUN 21; Creatinine, Ser 0.98; Hemoglobin 12.1; Platelets 267.0; Potassium 4.7; Sodium 139  Recent Lipid Panel    Component Value Date/Time   CHOL 187 04/10/2020 1445   CHOL 199 07/03/2019 0844   TRIG 225.0 (H) 04/10/2020 1445   TRIG 101 05/19/2006 0832   HDL 54.70 04/10/2020 1445   HDL 53 07/03/2019 0844   CHOLHDL 3 04/10/2020 1445   VLDL 45.0 (H) 04/10/2020 1445   LDLCALC 109 (H) 07/03/2019 0844   LDLDIRECT 103.0  04/10/2020 1445     Risk Assessment/Calculations:       Physical Exam:  VS:  BP 134/70 (BP Location: Left Arm, Patient Position: Sitting, Cuff Size: Large)   Pulse (!) 108   Ht 5' 6.5" (1.689 m)   Wt 210 lb (95.3 kg)   SpO2 99%   BMI 33.39 kg/m     Wt Readings from Last 3 Encounters:  08/06/20 210 lb (95.3 kg)  07/02/20 211 lb (95.7 kg)  06/03/20 216 lb 3.2 oz (98.1 kg)     GEN:  Well nourished, well developed in no acute distress HEENT: Normal NECK: No JVD; No carotid bruits LYMPHATICS: No lymphadenopathy CARDIAC: RRR, no murmurs, rubs, gallops RESPIRATORY:  Clear to auscultation without rales, wheezing or rhonchi  ABDOMEN: Soft, non-tender, non-distended MUSCULOSKELETAL:  No edema; No deformity  SKIN: Warm and dry NEUROLOGIC:  Alert and oriented x 3 PSYCHIATRIC:  Normal affect   ASSESSMENT:    1. Essential hypertension   2. Hyperlipidemia LDL goal <70   3. Controlled type 2 diabetes mellitus without complication, without long-term current use of insulin ([place])   4. Bilateral carotid artery stenosis    PLAN:    In order of problems listed above:  1. Hypertension: Blood pressure remains elevated at home in the 130-140s, I will increase hydralazine further to 100 mg 3 times daily dosing  2. Hyperlipidemia: On Zocor  3. DM2: Managed by primary care provider  4. Carotid artery disease: Previous carotid Doppler obtained in September 2020 showed 40 to 59% right ICA, 1 to 39% left ICA disease.  Consider repeat Doppler on the next follow-up.   Medication Adjustments/Labs and Tests Ordered: Current medicines are reviewed at length with the patient today.  Concerns regarding medicines are outlined above.  No orders of the defined types were placed in this encounter.  Meds ordered this encounter  Medications  . hydrALAZINE (APRESOLINE) 100 MG tablet    Sig: Take 1 tablet (100 mg total) by mouth 3 (three) times daily.    Dispense:  270 tablet    Refill:  3     Patient Instructions  Medication Instructions:   INCREASE Hydralazine to 100 mg 3 times a day *If you need a refill on your cardiac medications before your next appointment, please call your pharmacy*  Lab Work: NONE ordered at this time of appointment   If you have labs (blood work) drawn today and your tests are completely normal, you will receive your results only by: [name] MyChart Message (if you have MyChart) OR . A paper copy in the mail If you have any lab test that is abnormal or we need to change your treatment, we will call you to review the results.  Testing/Procedures: NONE ordered at this time of appointment   Follow-Up: At [facility], you and your health needs are our priority.  As part of our continuing mission to provide you with exceptional heart care, we have created designated Provider Care Teams.  These Care Teams include your primary Cardiologist (physician) and Advanced Practice Providers (APPs -  Physician Assistants and Nurse Practitioners) who all work together to provide you with the care you need, when you need it.  Your next appointment:   3 month(s)  The format for your next appointment:   In Person  Provider:   Sanda Klein, MD  Other Instructions      Signed, Almyra Deforest, [place]  08/08/2020 12:22 PM    [facility]

## 2020-08-06 NOTE — Patient Instructions (Signed)
Medication Instructions:   INCREASE Hydralazine to 100 mg 3 times a day *If you need a refill on your cardiac medications before your next appointment, please call your pharmacy*  Lab Work: NONE ordered at this time of appointment   If you have labs (blood work) drawn today and your tests are completely normal, you will receive your results only by: [name] MyChart Message (if you have MyChart) OR . A paper copy in the mail If you have any lab test that is abnormal or we need to change your treatment, we will call you to review the results.  Testing/Procedures: NONE ordered at this time of appointment   Follow-Up: At [facility], you and your health needs are our priority.  As part of our continuing mission to provide you with exceptional heart care, we have created designated Provider Care Teams.  These Care Teams include your primary Cardiologist (physician) and Advanced Practice Providers (APPs -  Physician Assistants and Nurse Practitioners) who all work together to provide you with the care you need, when you need it.  Your next appointment:   3 month(s)  The format for your next appointment:   In Person  Provider:   Sanda Klein, MD  Other Instructions

## 2020-08-08 ENCOUNTER — Encounter: Payer: Self-pay | Admitting: Physician Assistant

## 2020-08-28 MED ORDER — HYDROCODONE-ACETAMINOPHEN 5-325 MG PO TABS: 1.0000 | ORAL_TABLET | Freq: Every day | ORAL | 0 refills | Status: DC

## 2020-08-28 NOTE — Addendum Note (Signed)
Addended by: Binnie Rail on: 08/28/2020 07:17 AM   Modules accepted: Orders

## 2020-09-02 DIAGNOSIS — Z6834 Body mass index (BMI) 34.0-34.9, adult: Secondary | ICD-10-CM | POA: Diagnosis not present

## 2020-09-02 DIAGNOSIS — Z01419 Encounter for gynecological examination (general) (routine) without abnormal findings: Secondary | ICD-10-CM | POA: Diagnosis not present

## 2020-09-17 ENCOUNTER — Encounter: Payer: Self-pay | Admitting: Internal Medicine

## 2020-09-17 DIAGNOSIS — M545 Low back pain, unspecified: Secondary | ICD-10-CM

## 2020-09-23 DIAGNOSIS — M5459 Other low back pain: Secondary | ICD-10-CM | POA: Diagnosis not present

## 2020-09-24 MED ORDER — HYDROCODONE-ACETAMINOPHEN 5-325 MG PO TABS: 1.0000 | ORAL_TABLET | Freq: Every day | ORAL | 0 refills | Status: DC

## 2020-09-26 DIAGNOSIS — I739 Peripheral vascular disease, unspecified: Secondary | ICD-10-CM | POA: Diagnosis not present

## 2020-10-01 ENCOUNTER — Encounter: Payer: Self-pay | Admitting: Cardiovascular Disease

## 2020-10-01 ENCOUNTER — Other Ambulatory Visit: Payer: Self-pay

## 2020-10-01 ENCOUNTER — Ambulatory Visit: Payer: Medicare PPO | Admitting: Cardiovascular Disease

## 2020-10-01 VITALS — BP 127/62 | HR 103 | Ht 66.0 in | Wt 207.4 lb

## 2020-10-01 DIAGNOSIS — I739 Peripheral vascular disease, unspecified: Secondary | ICD-10-CM

## 2020-10-01 DIAGNOSIS — I10 Essential (primary) hypertension: Secondary | ICD-10-CM

## 2020-10-01 DIAGNOSIS — E119 Type 2 diabetes mellitus without complications: Secondary | ICD-10-CM

## 2020-10-01 DIAGNOSIS — E785 Hyperlipidemia, unspecified: Secondary | ICD-10-CM | POA: Diagnosis not present

## 2020-10-01 DIAGNOSIS — G4733 Obstructive sleep apnea (adult) (pediatric): Secondary | ICD-10-CM

## 2020-10-01 MED ORDER — SPIRONOLACTONE 25 MG PO TABS: 12.5000 mg | ORAL_TABLET | Freq: Every day | ORAL | Status: DC

## 2020-10-01 MED ORDER — ROSUVASTATIN CALCIUM 20 MG PO TABS: 20.0000 mg | ORAL_TABLET | Freq: Every day | ORAL | 3 refills | Status: DC

## 2020-10-01 NOTE — Progress Notes (Signed)
Cardiology Office Note:    Date:  10/03/2020   ID:  Erin Roach, DOB 1940/09/10, MRN [id number]  PCP:  Binnie Rail, MD  Cardiologist:  Sanda Klein, MD  Electrophysiologist:  None   Referring MD: Binnie Rail, MD   Chief Complaint  Patient presents with  . PAD  . Hypertension    History of Present Illness:    Erin Roach is a 80 y.o. female with a hx of PAD (moderate bilateral carotid stenosis), bifascicular block, diabetes mellitus type 2, hypertension, hyperlipidemia, obesity, obstructive sleep apnea, chronic lower extremity edema.  She has not had any more angina. Has chronic edema. BP is now well controlled after adding spironolactone. Has mild resting tachycardia (likely hydralazine related). Avoiding beta blockers due to reactive airway disease and IVCD.  The patient specifically denies any chest pain at rest exertion, dyspnea at rest or with exertion, orthopnea, paroxysmal nocturnal dyspnea, syncope, palpitations, focal neurological deficits, intermittent claudication, unexplained weight gain, cough, hemoptysis or wheezing.  A1c 6.1% in Dec 2021, LDL 103, TG 225, HDL 55.  - 2010 normal cardiac catheterization - 2019Myoview in 2019 low risk with no reversible ischemia, EF 71%, basal mid anterior attenuation artifact - September 2020   - episode of chest pain, cardiac enzymes and ECG negative.  - carotid doppler40 to 59% right ICA stenosis, 1 to 39% left ICA stenosis.     - Echo EF>65%, impaired relaxation of the LV, mild LAE, trivial MR  - LE ABI (December 2020) showed triphasic blood flow bilaterally, no PAD  She has not had any recurrent episodes of chest pain since then.  She takes omeprazole "only as needed".  She has a lot of problems with severe restless leg syndromes, although she uses CPAP for sleep apnea.  She is on multiple medications including gabapentin, amitriptyline, pramipexole, Lyrica.  She only scored 7 points on the Epworth Sleepiness  Scale.  She has mild intermittent lower extremity edema on a very low-dose of loop diuretic.    She has a history of a knee replacement in 2016 and back surgery in 2018.  Some limitations due to musculoskeletal pain.  She exercises regularly (physical therapy type exercises) 15 minutes a day 6 days a week and also walks.  Past Medical History:  Diagnosis Date  . Allergic rhinitis   . Apnea   . Arthritis   . Asthma    very mild  . Back pain   . Bilateral lower extremity edema   . Carotid artery disease ([place])    a. Carotid duplex 03/2016 - duplex was stable, 53-97% RICA, 6-73% LICA  . Complication of anesthesia    post-op delirium  . Endometrial polyp   . Essential hypertension   . GERD (gastroesophageal reflux disease)   . Heart murmur    per pt  . History of adenomatous polyp of colon    04-24-2016  tubular adenoma  . History of squamous cell carcinoma excision    pilonidal area second excision 02-02-2002  . HTN (hypertension)   . Hyperlipidemia   . Leg pain   . OAB (overactive bladder)   . RA (rheumatoid arthritis) ([place])   . RBBB (right bundle branch block with left anterior fascicular block)   . RLS (restless legs syndrome)   . Shortness of breath on exertion   . SUI (stress urinary incontinence, female)   . Venous insufficiency    legs  . Vitamin D deficiency     Past Surgical History:  Procedure Laterality Date  . APPENDECTOMY  07-02-2005   dr Erin Roach   open  . CARDIAC CATHETERIZATION  01-23-2009  dr Darnell Level brodie   normal coronary arteries and lvf  . CARDIOVASCULAR STRESS TEST  03/31/2016   Low risk nuclear study w/ medium defect of mild severity in basal anterior and mid anterior location w/ no evidence ischemia or infarction/  normal LV function and wall motion,  nuclear stress ef 71%  . CATARACT EXTRACTION W/ INTRAOCULAR LENS IMPLANT Left 09/2016  . COLONOSCOPY  last one 04-24-2016  . HYSTEROSCOPY WITH D & C N/A 12/22/2016   Procedure: DILATATION AND  CURETTAGE /HYSTEROSCOPY;  Surgeon: Dian Queen, MD;  Location: [facility];  Service: Gynecology;  Laterality: N/A;  . IR US GUIDANCE  09/08/2016  . POSTERIOR LUMBAR FUSION  02/ 2018    [facility] ([place])  . SKIN CANCER EXCISION    . TONSILLECTOMY  child  . 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 TRANSTHORACIC ECHOCARDIOGRAM  01/21/2009   mild LVH, ef 75-44%, grade 1 diastolic dysfunction  . ULNAR NERVE TRANSPOSITION Left 12-20-2008  dr sypher   decompression and partial resection medial triceps fascia  . WIDE LOCAL EXCISION PILONIDAL AREA  02-02-2002  dr Erin Roach   recurrent squamous cell carcinoma in situ    Current Medications: Current Meds  Medication Sig  . ACCU-CHEK GUIDE test strip TEST TWICE A DAY  . amitriptyline (ELAVIL) 50 MG tablet Take 1 tablet (50 mg total) by mouth at bedtime.  . Blood Glucose Monitoring Suppl (ONE TOUCH ULTRA 2) w/Device KIT USE TO TEST BLOOD SUGARS UP TO 4 TIMES DAILY  . budesonide-formoterol (SYMBICORT) 160-4.5 MCG/ACT inhaler Inhale 2 puffs into the lungs daily.  . ergocalciferol (VITAMIN D2) 1.25 MG (50000 UT) capsule Take 1 capsule (50,000 Units total) by mouth once a week. Take one tablet by mouth on Mondays  . fluticasone (FLONASE) 50 MCG/ACT nasal spray Place 1 spray into both nostrils daily as needed for allergies.  . furosemide (LASIX) 20 MG tablet Take 2 tablets (40 mg total) by mouth daily.  [name] HORIZANT 600 MG TBCR Take 1 tablet by mouth at bedtime.   . hydrALAZINE (APRESOLINE) 100 MG tablet Take 1 tablet (100 mg total) by mouth 3 (three) times daily.  [name] HYDROcodone-acetaminophen (NORCO/VICODIN) 5-325 MG tablet Take 1 tablet by mouth at bedtime.  . Lancets (ONETOUCH ULTRASOFT) lancets Use as instructed  . liraglutide (VICTOZA) 18 MG/3ML SOPN DIAL AND INJECT SUBCUTANEOUSLY 1.8MG  DAILY EVERY MORNING  . losartan (COZAAR) 100 MG tablet TAKE ONE TABLET BY MOUTH DAILY  . metFORMIN (GLUCOPHAGE) 500 MG tablet TAKE 1 TABLET BY MOUTH TWICE A DAY WITH MEALS  . pramipexole (MIRAPEX) 0.25 MG tablet Take 0.25 mg by mouth at bedtime.  . pregabalin (LYRICA) 75 MG capsule TAKE ONE CAPSULE BY MOUTH EVERY AFTERNOON AND TAKE TWO CAPSULES BY MOUTH EVERY NIGHT AT BEDTIME  . rosuvastatin (CRESTOR) 20 MG tablet Take 1 tablet (20 mg total) by mouth daily.  . [DISCONTINUED] simvastatin (ZOCOR) 40 MG tablet TAKE ONE TABLET BY MOUTH EVERY NIGHT AT BEDTIME  . [DISCONTINUED] spironolactone (ALDACTONE) 25 MG tablet Take 1 tablet (25 mg total) by mouth daily. (Patient taking differently: Take 12.5 mg by mouth daily. TAKE HALF A TABLET A DAY TOTAL 12.5MG)     Allergies:   Dust mite extract  Social History   Socioeconomic History  . Marital status: Married    Spouse name: Clare Gandy  . Number of children: 2  . Years of education: Not on file  . Highest education level: Not on file  Occupational History  . Occupation: retired Pharmacist, hospital  Tobacco Use  . Smoking status: Former Smoker    Packs/day: 0.50    Years: 10.00    Pack years: 5.00    Types: Cigarettes    Quit date: 12/10/1973    Years since quitting: 46.8  . Smokeless tobacco: Never Used  Vaping Use  . Vaping Use: Never used  Substance and Sexual Activity  . Alcohol use: Yes    Comment: occasional  . Drug use: No  . Sexual activity: Not on file  Other Topics Concern  . Not on file  Social History Narrative   2 children ages 76,35   Social Determinants of Health   Financial Resource Strain: Not on file  Food Insecurity: Not on file  Transportation Needs: Not on file  Physical Activity: Not on file  Stress: Not on file  Social Connections: Not on file     Family History: The patient's family history includes CAD in her sister; COPD in her maternal grandfather; Cancer in her father; Diabetes in her maternal grandmother and  another family member; Esophageal cancer in her father; Heart attack in her paternal grandmother; Heart attack (age of onset: 67) in her maternal grandfather; Heart disease in her father; Heart failure in her father; Hyperlipidemia in her father and mother; Hypertension in her father and mother; Stroke (age of onset: 72) in her maternal grandmother.  ROS:   Please see the history of present illness.     All other systems reviewed and are negative.  EKGs/Labs/Other Studies Reviewed:    The following studies were reviewed today: Carotid duplex ultrasound 01/23/2019: Summary: Right Carotid: Velocities in the right ICA are consistent with a 40-59% stenosis. No significant change from pervious study.  Left Carotid: Velocities in the left ICA are consistent with a 1-39% stenosis. No significant change from previous study.  Vertebrals:  Bilateral vertebral arteries demonstrate antegrade flow. Subclavians: Normal flow hemodynamics were seen in bilateral subclavian arteries.  ECHO 01/23/2019:  1. The left ventricle has hyperdynamic systolic function, with an ejection fraction of >65%. The cavity size was normal. There is mildly increased left ventricular wall thickness. Left ventricular diastolic Doppler parameters are consistent with  impaired relaxation. No evidence of left ventricular regional wall motion abnormalities.  2. The right ventricle has normal systolic function. The cavity was normal. There is no increase in right ventricular wall thickness.  3. Left atrial size was mildly dilated.  4. Trivial pericardial effusion is present.  5. There is mild to moderate mitral annular calcification present. No evidence of mitral valve stenosis. Trivial mitral regurgitation.  6. The aortic valve is tricuspid. Mild calcification of the aortic valve. No stenosis of the aortic valve.  7. The aortic root is normal in size and structure.  8. The IVC is normal in size. No complete TR doppler jet so  unable to estimate PA systolic pressure.  EKG:  EKG is not ordered today.  The ekg ordered 01/22/2019 demonstrates sinus rhythm, right bundle branch block, left anterior fascicular block, possible right atrial enlargement, no significant change 07/02/2020.  Recent Labs: 04/10/2020: ALT 39; BUN 21; Creatinine, Ser 0.98; Hemoglobin 12.1; Platelets 267.0; Potassium 4.7; Sodium 139  Recent Lipid Panel    Component Value Date/Time  CHOL 187 04/10/2020 1445   CHOL 199 07/03/2019 0844   TRIG 225.0 (H) 04/10/2020 1445   TRIG 101 05/19/2006 0832   HDL 54.70 04/10/2020 1445   HDL 53 07/03/2019 0844   CHOLHDL 3 04/10/2020 1445   VLDL 45.0 (H) 04/10/2020 1445   LDLCALC 109 (H) 07/03/2019 0844   LDLDIRECT 103.0 04/10/2020 1445    Physical Exam:    VS:  BP 127/62   Pulse (!) 103   Ht '5\' 6"'  (1.676 m)   Wt 207 lb 6.4 oz (94.1 kg)   SpO2 98%   BMI 33.48 kg/m     Wt Readings from Last 3 Encounters:  10/01/20 207 lb 6.4 oz (94.1 kg)  08/06/20 210 lb (95.3 kg)  07/02/20 211 lb (95.7 kg)     General: Alert, oriented x3, no distress, overweight Head: no evidence of trauma, PERRL, EOMI, no exophtalmos or lid lag, no myxedema, no xanthelasma; normal ears, nose and oropharynx Neck: normal jugular venous pulsations and no hepatojugular reflux; brisk carotid pulses without delay and faint bilateral carotid bruits Chest: clear to auscultation, no signs of consolidation by percussion or palpation, normal fremitus, symmetrical and full respiratory excursions Cardiovascular: normal position and quality of the apical impulse, regular rhythm, normal first and widely splitsecond heart sounds, no murmurs, rubs or gallops Abdomen: no tenderness or distention, no masses by palpation, no abnormal pulsatility or arterial bruits, normal bowel sounds, no hepatosplenomegaly Extremities:  Bilateral varicose veins and 2+ ankle edema, no clubbing, cyanosis; 2+ radial, ulnar and brachial pulses bilaterally; 2+ right  femoral, posterior tibial and dorsalis pedis pulses; 2+ left femoral, posterior tibial and dorsalis pedis pulses; no subclavian or femoral bruits Neurological: grossly nonfocal Psych: Normal mood and affect   ASSESSMENT:    1. PAD (peripheral artery disease) ([place])   2. Hyperlipidemia LDL goal <70   3. Controlled type 2 diabetes mellitus without complication, without long-term current use of insulin ([place])   4. Essential hypertension   5. OSA (obstructive sleep apnea)    PLAN:    In order of problems listed above:  1. PAD: Stable carotid stenoses 2017-2020. No neuro complaints. 2. HLP: Ideally would reach LDL<70. Switch to rosuvastatin and recheck labs in 3 months. 3. DM: well controlled 4. HTN: well controlled. Mild resting tachycardia. 5. OSA: reports compliance with CPAP.   Medication Adjustments/Labs and Tests Ordered: Current medicines are reviewed at length with the patient today.  Concerns regarding medicines are outlined above.  Orders Placed This Encounter  Procedures  . Lipid panel   Meds ordered this encounter  Medications  . spironolactone (ALDACTONE) 25 MG tablet    Sig: Take 0.5 tablets (12.5 mg total) by mouth daily.  . rosuvastatin (CRESTOR) 20 MG tablet    Sig: Take 1 tablet (20 mg total) by mouth daily.    Dispense:  90 tablet    Refill:  3    Patient Instructions  Medication Instructions:  STOP the Simvastatin  START Rosuvastatin 20 mg once daily  *If you need a refill on your cardiac medications before your next appointment, please call your pharmacy*   Lab Work: Your provider would like for you to return in 3 months to have the following labs drawn: fasting Lipid. You do not need an appointment for the lab. Once in our office lobby there is a podium where you can sign in and ring the doorbell to alert [name] that you are here. The lab is open from 8:00 am to 4:30 pm;  closed for lunch from 12:45pm-1:45pm.  If you have labs (blood work) drawn today and  your tests are completely normal, you will receive your results only by: [name] MyChart Message (if you have MyChart) OR . A paper copy in the mail If you have any lab test that is abnormal or we need to change your treatment, we will call you to review the results.   Testing/Procedures: None ordered   Follow-Up: At [facility], you and your health needs are our priority.  As part of our continuing mission to provide you with exceptional heart care, we have created designated Provider Care Teams.  These Care Teams include your primary Cardiologist (physician) and Advanced Practice Providers (APPs -  Physician Assistants and Nurse Practitioners) who all work together to provide you with the care you need, when you need it.  We recommend signing up for the patient portal called "MyChart".  Sign up information is provided on this After Visit Summary.  MyChart is used to connect with patients for Virtual Visits (Telemedicine).  Patients are able to view lab/test results, encounter notes, upcoming appointments, etc.  Non-urgent messages can be sent to your provider as well.   To learn more about what you can do with MyChart, go to [email or web address].    Your next appointment:   12 month(s)  The format for your next appointment:   In Person  Provider:   You may see Sanda Klein, MD or one of the following Advanced Practice Providers on your designated Care Team:    Almyra Deforest, PA-C  Fabian Sharp, [place] or   Roby Lofts, PA-C       Signed, Sanda Klein, MD  10/03/2020 10:32 PM    [place]

## 2020-10-01 NOTE — Patient Instructions (Signed)
Medication Instructions:  STOP the Simvastatin  START Rosuvastatin 20 mg once daily  *If you need a refill on your cardiac medications before your next appointment, please call your pharmacy*   Lab Work: Your provider would like for you to return in 3 months to have the following labs drawn: fasting Lipid. You do not need an appointment for the lab. Once in our office lobby there is a podium where you can sign in and ring the doorbell to alert [name] that you are here. The lab is open from 8:00 am to 4:30 pm; closed for lunch from 12:45pm-1:45pm.  If you have labs (blood work) drawn today and your tests are completely normal, you will receive your results only by: [name] MyChart Message (if you have MyChart) OR . A paper copy in the mail If you have any lab test that is abnormal or we need to change your treatment, we will call you to review the results.   Testing/Procedures: None ordered   Follow-Up: At [facility], you and your health needs are our priority.  As part of our continuing mission to provide you with exceptional heart care, we have created designated Provider Care Teams.  These Care Teams include your primary Cardiologist (physician) and Advanced Practice Providers (APPs -  Physician Assistants and Nurse Practitioners) who all work together to provide you with the care you need, when you need it.  We recommend signing up for the patient portal called "MyChart".  Sign up information is provided on this After Visit Summary.  MyChart is used to connect with patients for Virtual Visits (Telemedicine).  Patients are able to view lab/test results, encounter notes, upcoming appointments, etc.  Non-urgent messages can be sent to your provider as well.   To learn more about what you can do with MyChart, go to [email or web address].    Your next appointment:   12 month(s)  The format for your next appointment:   In Person  Provider:   You may see Sanda Klein, MD or one of the  following Advanced Practice Providers on your designated Care Team:    Almyra Deforest, PA-C  Fabian Sharp, PA-C or   Roby Lofts, [place]

## 2020-10-07 ENCOUNTER — Other Ambulatory Visit: Payer: Self-pay | Admitting: Pulmonary Disease

## 2020-10-09 NOTE — Telephone Encounter (Signed)
Dr. Elsworth Soho, please advise if you are okay with [name] refilling med.

## 2020-10-24 DIAGNOSIS — I83893 Varicose veins of bilateral lower extremities with other complications: Secondary | ICD-10-CM | POA: Diagnosis not present

## 2020-10-24 DIAGNOSIS — I872 Venous insufficiency (chronic) (peripheral): Secondary | ICD-10-CM | POA: Diagnosis not present

## 2020-10-27 ENCOUNTER — Encounter: Payer: Self-pay | Admitting: Internal Medicine

## 2020-10-27 DIAGNOSIS — M545 Low back pain, unspecified: Secondary | ICD-10-CM

## 2020-10-30 MED ORDER — HYDROCODONE-ACETAMINOPHEN 5-325 MG PO TABS: 1.0000 | ORAL_TABLET | Freq: Every day | ORAL | 0 refills | Status: DC

## 2020-11-01 DIAGNOSIS — H43813 Vitreous degeneration, bilateral: Secondary | ICD-10-CM | POA: Diagnosis not present

## 2020-11-01 DIAGNOSIS — Z961 Presence of intraocular lens: Secondary | ICD-10-CM | POA: Diagnosis not present

## 2020-11-01 DIAGNOSIS — E119 Type 2 diabetes mellitus without complications: Secondary | ICD-10-CM | POA: Diagnosis not present

## 2020-11-01 DIAGNOSIS — H2511 Age-related nuclear cataract, right eye: Secondary | ICD-10-CM | POA: Diagnosis not present

## 2020-11-01 LAB — HM DIABETES EYE EXAM

## 2020-11-14 DIAGNOSIS — G2581 Restless legs syndrome: Secondary | ICD-10-CM | POA: Diagnosis not present

## 2020-11-14 DIAGNOSIS — G4733 Obstructive sleep apnea (adult) (pediatric): Secondary | ICD-10-CM | POA: Diagnosis not present

## 2020-11-14 DIAGNOSIS — E669 Obesity, unspecified: Secondary | ICD-10-CM | POA: Diagnosis not present

## 2020-11-18 ENCOUNTER — Other Ambulatory Visit: Payer: Self-pay | Admitting: Internal Medicine

## 2020-11-18 DIAGNOSIS — E119 Type 2 diabetes mellitus without complications: Secondary | ICD-10-CM

## 2020-11-21 ENCOUNTER — Other Ambulatory Visit: Payer: Self-pay | Admitting: Internal Medicine

## 2020-11-21 ENCOUNTER — Encounter: Payer: Self-pay | Admitting: Internal Medicine

## 2020-11-21 DIAGNOSIS — E119 Type 2 diabetes mellitus without complications: Secondary | ICD-10-CM

## 2020-11-21 NOTE — Progress Notes (Unsigned)
Outside notes received. Information abstracted. Notes sent to scan.  

## 2020-11-27 ENCOUNTER — Encounter: Payer: Self-pay | Admitting: Internal Medicine

## 2020-11-28 ENCOUNTER — Other Ambulatory Visit: Payer: Self-pay | Admitting: Internal Medicine

## 2020-11-28 DIAGNOSIS — M545 Low back pain, unspecified: Secondary | ICD-10-CM

## 2020-11-28 MED ORDER — HYDROCODONE-ACETAMINOPHEN 5-325 MG PO TABS: 1.0000 | ORAL_TABLET | Freq: Every day | ORAL | 0 refills | Status: DC

## 2020-12-12 ENCOUNTER — Ambulatory Visit: Payer: Medicare PPO | Admitting: Orthopaedic Surgery

## 2020-12-12 ENCOUNTER — Ambulatory Visit (INDEPENDENT_AMBULATORY_CARE_PROVIDER_SITE_OTHER): Payer: Medicare PPO

## 2020-12-12 ENCOUNTER — Other Ambulatory Visit: Payer: Self-pay

## 2020-12-12 ENCOUNTER — Encounter: Payer: Self-pay | Admitting: Orthopaedic Surgery

## 2020-12-12 VITALS — Ht 66.0 in | Wt 190.0 lb

## 2020-12-12 DIAGNOSIS — M25561 Pain in right knee: Secondary | ICD-10-CM | POA: Insufficient documentation

## 2020-12-12 NOTE — Progress Notes (Signed)
Office Visit Note   Patient: Erin Roach           Date of Birth: April 01, 1941           MRN: [id number] Visit Date: 12/12/2020              Requested by: Binnie Rail, MD [place],  [place] [id number] PCP: Binnie Rail, MD   Assessment & Plan: Visit Diagnoses:  1. Acute pain of right knee     Plan: [place]a had acute onset of right knee pain after a fall about 10 days ago.  She has had prior diagnosis of osteoarthritis of that knee and, in fact, has had a prior left total knee replacement and doing well from that standpoint.  The knee was not hot red warm or swollen.  No effusion.  She had full extension and able to flex her knee well over 100 degrees without instability.  X-rays were nondiagnostic.  I suspect that she is probably exacerbated her arthritis with soft tissue bruising.  Should continue with her cane over-the-counter medicines and I applied a spider brace that appeared to help.  She does have hydrocodone at home  Follow-Up Instructions: Return if symptoms worsen or fail to improve.   Orders:  Orders Placed This Encounter  Procedures   XR KNEE 3 VIEW RIGHT   No orders of the defined types were placed in this encounter.     Procedures: No procedures performed   Clinical Data: No additional findings.   Subjective: Chief Complaint  Patient presents with   Right Knee - Pain  Patient presents today for right knee pain. She states that she fell one week ago and landed on her right knee. She states that it was sore, but the pain worsening a couple day later. She has difficulty with weightbearing and is using a cane. She has more pain with any prolonged activity or upon getting up after rest. Her pain is located around the patella and she states that it is swollen. She is not taking anything for pain. She also states that she has had no prior knee surgery on the right side.   HPI  Review of Systems   Objective: Vital Signs: Ht '5\' 6"'$  (1.676 m)    Wt 190 lb (86.2 kg)   BMI 30.67 kg/m   Physical Exam Constitutional:      Appearance: She is well-developed.  Pulmonary:     Effort: Pulmonary effort is normal.  Skin:    General: Skin is warm and dry.  Neurological:     Mental Status: She is alert and oriented to person, place, and time.  Psychiatric:        Behavior: Behavior normal.    Ortho Exam right knee was not bruised there is no evidence of ecchymosis.  The knee was not hot warm or red.  Full quick extension of flexed over 100 degrees without any opening with varus or valgus stress.  Negative anterior drawer sign.  No localized areas of tenderness.  No calf pain.  No popliteal pain.  Painless range of motion of her hip.  No pain with patella compression.  Soft tissue intact  Specialty Comments:  No specialty comments available.  Imaging: XR KNEE 3 VIEW RIGHT  Result Date: 12/12/2020 Films of the right knee were obtained in 3 projections standing.  There was an injury related to a fall about 10 days ago.  I did not see any evidence of an  effusion or acute change.  No obvious fracture.  There were some degenerative changes in all 3 compartments but the joint spaces are well-maintained and alignment appear to be neutral.  Patella is in the normal position.  No obvious bony trauma    PMFS History: Patient Active Problem List   Diagnosis Date Noted   Pain in right knee 12/12/2020   Anemia 04/09/2020   Carotid arterial disease ([place]) 01/23/2019   RBBB (right bundle branch block with left anterior fascicular block)    Chest pain 01/22/2019   Weakness of both lower extremities 01/02/2019   Frequent falls 01/02/2019   OSA on CPAP 06/15/2017   Diabetes ([place]) 03/02/2017   Herpes zoster without complication 123456   B12 nutritional deficiency 02/23/2017   Vitamin D deficiency 12/08/2016   Tubular adenoma of colon 04/29/2016   Essential hypertension 09/12/2015   Allergic rhinitis 07/17/2015   Insomnia 07/17/2015   Obese  04/10/2015   S/P left TKA 04/09/2015   Osteopenia 12/04/2012   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 Carotid duplex 03/2016 - duplex was stable, 123456 RICA, 123456 LICA   Complication of anesthesia    post-op delirium   Endometrial polyp    Essential hypertension    GERD (gastroesophageal reflux disease)    Heart murmur    per pt   History of adenomatous polyp of colon    04-24-2016  tubular adenoma   History of squamous cell carcinoma excision    pilonidal area second excision 02-02-2002   HTN (hypertension)    Hyperlipidemia    Leg pain    OAB (overactive bladder)    RA (rheumatoid arthritis) (HCC)    RBBB (right bundle branch block with left anterior fascicular block)    RLS (restless legs syndrome)    Shortness of breath on exertion    SUI (stress urinary incontinence, female)    Venous insufficiency    legs   Vitamin D deficiency     Family History  Problem Relation Age of Onset   Esophageal cancer Father    Heart disease Father        Unknown what kind   Hyperlipidemia Father    Hypertension Father    Cancer Father    Heart failure Father    Hyperlipidemia Mother    Hypertension Mother    CAD Sister        92 years younger than patient - had stent. Was told that her anorexia/bulimia may have played a role   Stroke Maternal Grandmother 66   Diabetes Maternal Grandmother    Heart attack Maternal Grandfather 93   COPD Maternal Grandfather    Heart attack Paternal Grandmother        70s   Diabetes Other        [place]    Past Surgical History:  Procedure  Laterality Date   APPENDECTOMY  07-02-2005   dr weatherly   open   CARDIAC CATHETERIZATION  01-23-2009  dr Darnell Level brodie   normal coronary arteries and  lvf   CARDIOVASCULAR STRESS TEST  03/31/2016   Low risk nuclear study w/ medium defect of mild severity in basal anterior and mid anterior location w/ no evidence ischemia or infarction/  normal LV function and wall motion,  nuclear stress ef 71%   CATARACT EXTRACTION W/ INTRAOCULAR LENS IMPLANT Left 09/2016   COLONOSCOPY  last one 04-24-2016   HYSTEROSCOPY WITH D & C N/A 12/22/2016   Procedure: DILATATION AND CURETTAGE /HYSTEROSCOPY;  Surgeon: Dian Queen, MD;  Location: [facility];  Service: Gynecology;  Laterality: N/A;   IR US GUIDANCE  09/08/2016   POSTERIOR LUMBAR FUSION  02/ 2018    [facility] ([place])   SKIN CANCER EXCISION     TONSILLECTOMY  child   TOTAL KNEE ARTHROPLASTY Left 04/09/2015   Procedure: LEFT TOTAL KNEE ARTHROPLASTY;  Surgeon: Paralee Cancel, MD;  Location: [facility];  Service: Orthopedics;  Laterality: Left;   TRANSTHORACIC ECHOCARDIOGRAM  01/21/2009   mild LVH, ef Q000111Q, grade 1 diastolic dysfunction   ULNAR NERVE TRANSPOSITION Left 12-20-2008  dr sypher   decompression and partial resection medial triceps fascia   WIDE LOCAL EXCISION PILONIDAL AREA  02-02-2002  [name]   recurrent squamous cell carcinoma in situ   Social History   Occupational History   Occupation: retired Pharmacist, hospital  Tobacco Use   Smoking status: Former    Packs/day: 0.50    Years: 10.00    Pack years: 5.00    Types: Cigarettes    Quit date: 12/10/1973    Years since quitting: 47.0   Smokeless tobacco: Never  Vaping Use   Vaping Use: Never used  Substance and Sexual Activity   Alcohol use: Yes    Comment: occasional   Drug use: No   Sexual activity: Not on file

## 2020-12-24 ENCOUNTER — Encounter: Payer: Self-pay | Admitting: Internal Medicine

## 2020-12-25 ENCOUNTER — Telehealth: Payer: Self-pay | Admitting: Lab

## 2020-12-25 NOTE — Progress Notes (Signed)
  Chronic Care Management   Outreach Note  12/25/2020 Name: Erin Roach MRN: [id number] DOB: 07-26-1940  Referred by: Binnie Rail, MD Reason for referral : Medication Management   An unsuccessful telephone outreach was attempted today. The patient was referred to the pharmacist for assistance with care management and care coordination.   Follow Up Plan:   [place]

## 2020-12-25 NOTE — Telephone Encounter (Signed)
Instructed pt that since her last non-acute visit was 04/10/20 (PCP requested she be seen in 77mo she would need to make an appt for a medication refill.  Appt made for 01/02/21  Last RF per controlled substance database: 11/28/20 Last OV: 06/03/20 Next OV: 01/02/21

## 2021-01-01 NOTE — Patient Instructions (Addendum)
   Medications changes include :   add in COq-10 - otc supplement.   Increase the hydrocodone   Your prescription(s) have been submitted to your pharmacy. Please take as directed and contact our office if you believe you are having problem(s) with the medication(s).     Please followup in 6 months

## 2021-01-01 NOTE — Progress Notes (Signed)
Subjective:    Patient ID: Erin Roach, female    DOB: 1941/03/09, 80 y.o.   MRN: [id number]  HPI The patient is here for follow up of their chronic medical problems, including htn, hichol, DM, RLS, asthma  She continues to have severe RLS.  She does follow with sleep specialist.  She has episodes that are more severe than other times and she barely slept for the past 3 nights.  She is exhausted and at this point can barely function.  Fell at [facility] - saw ortho- deep bruise injury to RLE.    Hand and feet coldnesss at times.  No pain.    Medications and allergies reviewed with patient and updated if appropriate.  Patient Active Problem List   Diagnosis Date Noted   Pain in right knee 12/12/2020   Anemia 04/09/2020   Carotid arterial disease (HCC) 01/23/2019   RBBB (right bundle branch block with left anterior fascicular block)    Chest pain 01/22/2019   Weakness of both lower extremities 01/02/2019   Frequent falls 01/02/2019   OSA on CPAP 06/15/2017   Diabetes (HCC) 03/02/2017   Herpes zoster without complication 03/02/2017   B12 nutritional deficiency 02/23/2017   Vitamin D deficiency 12/08/2016   Tubular adenoma of colon 04/29/2016   Essential hypertension 09/12/2015   Allergic rhinitis 07/17/2015   Insomnia 07/17/2015   Obese 04/10/2015   S/P left TKA 04/09/2015   Osteopenia 12/04/2012   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     BD PEN NEEDLE NANO 2ND GEN 32G X 4 MM MISC USE TWICE DAILY AS DIRECTED 100 each 2    Blood Glucose Monitoring Suppl (ONE TOUCH ULTRA 2) w/Device KIT USE TO TEST BLOOD SUGARS UP TO 4 TIMES DAILY 1 each 0   budesonide-formoterol (SYMBICORT) 160-4.5 MCG/ACT inhaler Inhale 2 puffs into the lungs daily. 1 Inhaler 5   ergocalciferol (VITAMIN D2) 1.25 MG (50000 UT) capsule Take 1 capsule (50,000 Units total) by mouth once a week. Take one tablet by mouth on Mondays 12 capsule 0   fluticasone (FLONASE) 50 MCG/ACT nasal spray Place 1 spray into both nostrils daily as needed for allergies. 11.1 mL 0   furosemide (LASIX) 20 MG tablet Take 2 tablets (40 mg total) by mouth daily. 180 tablet 3   HORIZANT 600 MG TBCR Take 1 tablet by mouth at bedtime.      hydrALAZINE (APRESOLINE) 100 MG tablet Take 1 tablet (100 mg total) by mouth 3 (three) times daily. 270 tablet 3   HYDROcodone-acetaminophen (NORCO/VICODIN) 5-325 MG tablet Take 1 tablet by mouth at bedtime. 30 tablet 0   Lancets (ONETOUCH ULTRASOFT) lancets Use as instructed 100 each 0   liraglutide (VICTOZA) 18 MG/3ML SOPN DIAL AND INJECT SUBCUTANEOUSLY 1.8MG  DAILY EVERY MORNING 9 mL 0   losartan (COZAAR) 100 MG tablet TAKE ONE TABLET BY MOUTH DAILY 90 tablet 1   metFORMIN (GLUCOPHAGE) 500 MG tablet TAKE 1 TABLET BY MOUTH TWICE A DAY WITH MEALS 180 tablet 1   pramipexole (MIRAPEX)  0.25 MG tablet Take 0.25 mg by mouth at bedtime.     pregabalin (LYRICA) 75 MG capsule TAKE ONE CAPSULE BY MOUTH EVERY AFTERNOON AND TAKE TWO CAPSULES BY MOUTH EVERY NIGHT AT BEDTIME 270 capsule 0   spironolactone (ALDACTONE) 25 MG tablet Take 0.5 tablets (12.5 mg total) by mouth daily.     rosuvastatin (CRESTOR) 20 MG tablet Take 1 tablet (20 mg total) by mouth daily. 90 tablet 3   No current facility-administered medications on file prior to visit.    Past Medical History:  Diagnosis Date   Allergic rhinitis    Apnea    Arthritis    Asthma    very mild   Back pain    Bilateral lower extremity edema    Carotid artery disease (HCC)    a. Carotid duplex  03/2016 - duplex was stable, 30-86% RICA, 5-78% LICA   Complication of anesthesia    post-op delirium   Endometrial polyp    Essential hypertension    GERD (gastroesophageal reflux disease)    Heart murmur    per pt   History of adenomatous polyp of colon    04-24-2016  tubular adenoma   History of squamous cell carcinoma excision    pilonidal area second excision 02-02-2002   HTN (hypertension)    Hyperlipidemia    Leg pain    OAB (overactive bladder)    RA (rheumatoid arthritis) (HCC)    RBBB (right bundle branch block with left anterior fascicular block)    RLS (restless legs syndrome)    Shortness of breath on exertion    SUI (stress urinary incontinence, female)    Venous insufficiency    legs   Vitamin D deficiency     Past Surgical History:  Procedure Laterality Date   APPENDECTOMY  07-02-2005   [name]   open   CARDIAC CATHETERIZATION  01-23-2009  dr Darnell Level brodie   normal coronary arteries and lvf   CARDIOVASCULAR STRESS TEST  03/31/2016   Low risk nuclear study w/ medium defect of mild severity in basal anterior and mid anterior location w/ no evidence ischemia or infarction/  normal LV function and wall motion,  nuclear stress ef 71%   CATARACT EXTRACTION W/ INTRAOCULAR LENS IMPLANT Left 09/2016   COLONOSCOPY  last one 04-24-2016   HYSTEROSCOPY WITH D & C N/A 12/22/2016   Procedure: DILATATION AND CURETTAGE /HYSTEROSCOPY;  Surgeon: Dian Queen, MD;  Location: [facility];  Service: Gynecology;  Laterality: N/A;   IR US GUIDANCE  09/08/2016   POSTERIOR LUMBAR FUSION  02/ 2018    [facility] ([place], [place])   SKIN CANCER EXCISION     TONSILLECTOMY  child   TOTAL KNEE ARTHROPLASTY Left 04/09/2015   Procedure: LEFT TOTAL KNEE ARTHROPLASTY;  Surgeon: Paralee Cancel, MD;  [facility];  Service: Orthopedics;  Laterality: Left;   TRANSTHORACIC ECHOCARDIOGRAM  01/21/2009   mild LVH, ef 46-96%, grade 1 diastolic dysfunction   ULNAR NERVE  TRANSPOSITION Left 12-20-2008  dr sypher   decompression and partial resection medial triceps fascia   WIDE LOCAL EXCISION PILONIDAL AREA  02-02-2002  [name]   recurrent squamous cell carcinoma in situ    Social History   Socioeconomic History   Marital status: Married    Spouse name: Clare Gandy   Number of children: 2   Years of education: Not on file   Highest education level: Not on file  Occupational History   Occupation: retired Pharmacist, hospital  Tobacco Use  Smoking status: Former    Packs/day: 0.50    Years: 10.00    Pack years: 5.00    Types: Cigarettes    Quit date: 12/10/1973    Years since quitting: 47.0   Smokeless tobacco: Never  Vaping Use   Vaping Use: Never used  Substance and Sexual Activity   Alcohol use: Yes    Comment: occasional   Drug use: No   Sexual activity: Not on file  Other Topics Concern   Not on file  Social History Narrative   2 children ages 39,35   Social Determinants of Health   Financial Resource Strain: Not on file  Food Insecurity: Not on file  Transportation Needs: Not on file  Physical Activity: Not on file  Stress: Not on file  Social Connections: Not on file    Family History  Problem Relation Age of Onset   Esophageal cancer Father    Heart disease Father        Unknown what kind   Hyperlipidemia Father    Hypertension Father    Cancer Father    Heart failure Father    Hyperlipidemia Mother    Hypertension Mother    CAD Sister        31 years younger than patient - had stent. Was told that her anorexia/bulimia may have played a role   Stroke Maternal Grandmother 66   Diabetes Maternal Grandmother    Heart attack Maternal Grandfather 93   COPD Maternal Grandfather    Heart attack Paternal Grandmother        70s   Diabetes Other        [place]    Review of Systems  Constitutional:  Positive for fatigue. Negative for fever.  Respiratory:  Negative for cough, shortness of breath and wheezing.   Cardiovascular:  Positive  for leg swelling. Negative for chest pain and palpitations.  Neurological:  Negative for headaches.      Objective:   Vitals:   01/02/21 1040  BP: 132/80  Pulse: (!) 103  Temp: 98.5 F (36.9 C)  SpO2: 99%   BP Readings from Last 3 Encounters:  01/02/21 132/80  10/01/20 127/62  08/06/20 134/70   Wt Readings from Last 3 Encounters:  01/02/21 203 lb 12.8 oz (92.4 kg)  12/12/20 190 lb (86.2 kg)  10/01/20 207 lb 6.4 oz (94.1 kg)   Body mass index is 32.89 kg/m.  Depression screen PHQ 2/9 09/29/2019  Decreased Interest 0  Down, Depressed, Hopeless 0  PHQ - 2 Score 0  Some recent data might be hidden    No flowsheet data found.     Physical Exam    Constitutional: Appears well-developed and well-nourished. No distress.  HENT:  Head: Normocephalic and atraumatic.  Neck: Neck supple. No tracheal deviation present. No thyromegaly present.  No cervical lymphadenopathy Cardiovascular: Normal rate, regular rhythm and normal heart sounds.   No murmur heard. No carotid bruit .  No edema Pulmonary/Chest: Effort normal and breath sounds normal. No respiratory distress. No has no wheezes. No rales.  Skin: Skin is warm and dry. Not diaphoretic.  Psychiatric: Normal mood and affect. Behavior is normal.      Assessment & Plan:    Screened for depression using the PHQ 9 scale.  No evidence of depression.   Screened for anxiety using GAD7 Scale.  No evidence of anxiety.   See Problem List for Assessment and Plan of chronic medical problems.    This visit occurred during the  SARS-CoV-2 public health emergency.  Safety protocols were in place, including screening questions prior to the visit, additional usage of staff PPE, and extensive cleaning of exam room while observing appropriate contact time as indicated for disinfecting solutions.

## 2021-01-02 ENCOUNTER — Encounter: Payer: Self-pay | Admitting: Internal Medicine

## 2021-01-02 ENCOUNTER — Ambulatory Visit: Payer: Medicare PPO | Admitting: Internal Medicine

## 2021-01-02 ENCOUNTER — Other Ambulatory Visit: Payer: Self-pay

## 2021-01-02 VITALS — BP 132/80 | HR 103 | Temp 98.5°F | Ht 66.0 in | Wt 203.8 lb

## 2021-01-02 DIAGNOSIS — J45901 Unspecified asthma with (acute) exacerbation: Secondary | ICD-10-CM | POA: Diagnosis not present

## 2021-01-02 DIAGNOSIS — M545 Low back pain, unspecified: Secondary | ICD-10-CM | POA: Diagnosis not present

## 2021-01-02 DIAGNOSIS — E785 Hyperlipidemia, unspecified: Secondary | ICD-10-CM | POA: Diagnosis not present

## 2021-01-02 DIAGNOSIS — E7849 Other hyperlipidemia: Secondary | ICD-10-CM | POA: Diagnosis not present

## 2021-01-02 DIAGNOSIS — E1129 Type 2 diabetes mellitus with other diabetic kidney complication: Secondary | ICD-10-CM | POA: Diagnosis not present

## 2021-01-02 DIAGNOSIS — R809 Proteinuria, unspecified: Secondary | ICD-10-CM

## 2021-01-02 DIAGNOSIS — G2581 Restless legs syndrome: Secondary | ICD-10-CM

## 2021-01-02 DIAGNOSIS — I10 Essential (primary) hypertension: Secondary | ICD-10-CM | POA: Diagnosis not present

## 2021-01-02 DIAGNOSIS — Z1331 Encounter for screening for depression: Secondary | ICD-10-CM

## 2021-01-02 MED ORDER — HYDROCODONE-ACETAMINOPHEN 5-325 MG PO TABS: 1.0000 | ORAL_TABLET | Freq: Every day | ORAL | 0 refills | Status: DC

## 2021-01-02 NOTE — Assessment & Plan Note (Signed)
Chronic BP well controlled Continue Lasix 40 mg daily, hydralazine 100 mg 3 times daily, losartan 100 mg daily, spironolactone 12.5 mg daily

## 2021-01-02 NOTE — Assessment & Plan Note (Signed)
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

## 2021-01-02 NOTE — Assessment & Plan Note (Signed)
Chronic Controlled Continue Symbicort twice daily

## 2021-01-02 NOTE — Assessment & Plan Note (Signed)
Chronic Lipid panel checked today by cardiology Continue Crestor 20 mg daily Regular exercise and healthy diet encouraged

## 2021-01-02 NOTE — Assessment & Plan Note (Signed)
Chronic Lab Results  Component Value Date   HGBA1C 6.1 04/10/2020   Controlled Continue metformin 500 mg twice daily

## 2021-01-03 LAB — LIPID PANEL
Chol/HDL Ratio: 3 ratio (ref 0.0–4.4)
Cholesterol, Total: 163 mg/dL (ref 100–199)
HDL: 55 mg/dL (ref >39)
LDL Chol Calc (NIH): 84 mg/dL (ref 0–99)
Triglycerides: 136 mg/dL (ref 0–149)
VLDL Cholesterol Cal: 24 mg/dL (ref 5–40)

## 2021-01-09 ENCOUNTER — Telehealth: Payer: Self-pay | Admitting: Lab

## 2021-01-09 NOTE — Progress Notes (Signed)
  Chronic Care Management   Outreach Note  01/09/2021 Name: Erin Roach MRN: [id number] DOB: 1940-11-29  Referred by: Binnie Rail, MD Reason for referral : Medication Management   A second unsuccessful telephone outreach was attempted today. The patient was referred to pharmacist for assistance with care management and care coordination.  Follow Up Plan:   [place]

## 2021-01-11 DIAGNOSIS — E785 Hyperlipidemia, unspecified: Secondary | ICD-10-CM

## 2021-01-14 MED ORDER — EZETIMIBE 10 MG PO TABS: 10.0000 mg | ORAL_TABLET | Freq: Every day | ORAL | 3 refills | Status: DC

## 2021-01-17 ENCOUNTER — Other Ambulatory Visit: Payer: Self-pay | Admitting: Internal Medicine

## 2021-01-17 DIAGNOSIS — E119 Type 2 diabetes mellitus without complications: Secondary | ICD-10-CM

## 2021-02-03 ENCOUNTER — Encounter: Payer: Self-pay | Admitting: Internal Medicine

## 2021-02-03 DIAGNOSIS — M545 Low back pain, unspecified: Secondary | ICD-10-CM

## 2021-02-05 MED ORDER — HYDROCODONE-ACETAMINOPHEN 5-325 MG PO TABS: 1.0000 | ORAL_TABLET | Freq: Every day | ORAL | 0 refills | Status: DC

## 2021-02-06 DIAGNOSIS — Z1231 Encounter for screening mammogram for malignant neoplasm of breast: Secondary | ICD-10-CM | POA: Diagnosis not present

## 2021-02-06 LAB — HM MAMMOGRAPHY

## 2021-02-27 ENCOUNTER — Encounter: Payer: Self-pay | Admitting: Internal Medicine

## 2021-02-27 NOTE — Progress Notes (Signed)
Outside notes received. Information abstracted. Notes sent to scan.  

## 2021-03-03 DIAGNOSIS — G629 Polyneuropathy, unspecified: Secondary | ICD-10-CM | POA: Diagnosis not present

## 2021-03-04 ENCOUNTER — Encounter: Payer: Self-pay | Admitting: Internal Medicine

## 2021-03-06 ENCOUNTER — Encounter: Payer: Self-pay | Admitting: Orthopaedic Surgery

## 2021-03-06 ENCOUNTER — Other Ambulatory Visit: Payer: Self-pay

## 2021-03-06 DIAGNOSIS — G8929 Other chronic pain: Secondary | ICD-10-CM

## 2021-03-07 ENCOUNTER — Encounter: Payer: Self-pay | Admitting: Orthopaedic Surgery

## 2021-03-07 MED ORDER — ROPINIROLE HCL 0.25 MG PO TABS: 0.2500 mg | ORAL_TABLET | Freq: Every day | ORAL | 5 refills | Status: DC

## 2021-03-07 NOTE — Addendum Note (Signed)
Addended by: Binnie Rail on: 03/07/2021 09:48 AM   Modules accepted: Orders

## 2021-03-10 ENCOUNTER — Other Ambulatory Visit: Payer: Self-pay

## 2021-03-10 DIAGNOSIS — G8929 Other chronic pain: Secondary | ICD-10-CM

## 2021-03-10 MED ORDER — LIDOCAINE 5 % EX PTCH: 1.0000 | MEDICATED_PATCH | CUTANEOUS | Status: DC

## 2021-03-10 MED ORDER — LIDOCAINE 5 % EX PTCH: 1.0000 | MEDICATED_PATCH | CUTANEOUS | 0 refills | Status: DC

## 2021-03-11 ENCOUNTER — Encounter: Payer: Self-pay | Admitting: Orthopaedic Surgery

## 2021-03-14 ENCOUNTER — Encounter: Payer: Self-pay | Admitting: Internal Medicine

## 2021-03-14 ENCOUNTER — Encounter: Payer: Self-pay | Admitting: *Deleted

## 2021-03-14 DIAGNOSIS — M545 Low back pain, unspecified: Secondary | ICD-10-CM

## 2021-03-17 MED ORDER — HYDROCODONE-ACETAMINOPHEN 5-325 MG PO TABS: 1.0000 | ORAL_TABLET | Freq: Every day | ORAL | 0 refills | Status: DC

## 2021-03-18 ENCOUNTER — Telehealth: Payer: Self-pay | Admitting: Orthopaedic Surgery

## 2021-03-18 NOTE — Telephone Encounter (Signed)
Called 2x but unable to leave message as mail box is full- trying to sch MRI right knee review with Dr. Durward Fortes, after 04/08/21

## 2021-04-08 ENCOUNTER — Ambulatory Visit
Admission: RE | Admit: 2021-04-08 | Discharge: 2021-04-08 | Disposition: A | Discharge status: Home / Self Care | Payer: Medicare PPO | Source: Ambulatory Visit | Attending: Orthopaedic Surgery | Admitting: Orthopaedic Surgery

## 2021-04-08 ENCOUNTER — Other Ambulatory Visit: Payer: Self-pay | Admitting: Cardiovascular Disease

## 2021-04-08 DIAGNOSIS — M1711 Unilateral primary osteoarthritis, right knee: Secondary | ICD-10-CM | POA: Diagnosis not present

## 2021-04-08 DIAGNOSIS — G8929 Other chronic pain: Secondary | ICD-10-CM

## 2021-04-08 DIAGNOSIS — S83281A Other tear of lateral meniscus, current injury, right knee, initial encounter: Secondary | ICD-10-CM | POA: Diagnosis not present

## 2021-04-08 DIAGNOSIS — M7121 Synovial cyst of popliteal space [Baker], right knee: Secondary | ICD-10-CM | POA: Diagnosis not present

## 2021-04-08 DIAGNOSIS — S83231A Complex tear of medial meniscus, current injury, right knee, initial encounter: Secondary | ICD-10-CM | POA: Diagnosis not present

## 2021-04-08 MED ORDER — ROSUVASTATIN CALCIUM 20 MG PO TABS: 20.0000 mg | ORAL_TABLET | Freq: Every day | ORAL | 3 refills | Status: DC

## 2021-04-15 ENCOUNTER — Ambulatory Visit: Payer: Medicare PPO | Admitting: Orthopaedic Surgery

## 2021-04-15 ENCOUNTER — Encounter: Payer: Self-pay | Admitting: Orthopaedic Surgery

## 2021-04-15 ENCOUNTER — Telehealth: Payer: Self-pay

## 2021-04-15 ENCOUNTER — Other Ambulatory Visit: Payer: Self-pay

## 2021-04-15 DIAGNOSIS — M25561 Pain in right knee: Secondary | ICD-10-CM | POA: Diagnosis not present

## 2021-04-15 DIAGNOSIS — G8929 Other chronic pain: Secondary | ICD-10-CM

## 2021-04-15 MED ORDER — LIDOCAINE HCL 1 % IJ SOLN
2.0000 mL | INTRAMUSCULAR | Status: AC | PRN
  Administered 2021-04-15: 2 mL

## 2021-04-15 MED ORDER — BUPIVACAINE HCL 0.25 % IJ SOLN
2.0000 mL | INTRAMUSCULAR | Status: AC | PRN
  Administered 2021-04-15: 2 mL via INTRA_ARTICULAR

## 2021-04-15 MED ORDER — METHYLPREDNISOLONE ACETATE 40 MG/ML IJ SUSP
80.0000 mg | INTRAMUSCULAR | Status: AC | PRN
  Administered 2021-04-15: 80 mg via INTRA_ARTICULAR

## 2021-04-15 NOTE — Telephone Encounter (Signed)
Please precert for right knee visco injections for next year. Thanks!

## 2021-04-15 NOTE — Progress Notes (Signed)
Office Visit Note   Patient: Erin Roach           Date of Birth: 05/14/1940           MRN: [id number] Visit Date: 04/15/2021              Requested by: Binnie Rail, MD [place],  [place] [id number] PCP: Binnie Rail, MD   Assessment & Plan: Visit Diagnoses:  1. Chronic pain of right knee     Plan: Erin Roach has been experiencing right knee pain over a period of many months.  There are some degenerative changes in all 3 compartments but the joint spaces are well-maintained.  I ordered an MRI scan that demonstrated complex tear of the posterior horn of body the medial meniscus with peripheral extrusion and lateral degeneration of the lateral meniscus with a small radial tear of the free edge.  In addition she had tricompartmental degenerative changes predominantly in the patellofemoral articulation and medially where there were areas of full-thickness cartilage loss.  I suspect that her pain is a combination of the torn meniscus and the arthritis.  Long discussion regarding all of the above.  I think it is worth pursuing a nonoperative course at this point and will try cortisone.  Also preauthorize viscosupplementation and have her return in 2 weeks.  I really not sure that an arthroscopic debridement is really can to be much of a difference given the severity of the arthritis.  She really is not experiencing mechanical symptoms. Erin Roach is also experiencing some chronic left knee pain after having a knee replacement performed elsewhere.  I did see her for that in 2021.  CT scan revealed no significant abnormalities to suggest aseptic loosening or infection.  There were areas of right knee osteoarthritis.  We can reevaluate her knee with repeat films and follow-up. This patient is diagnosed with osteoarthritis of the knee(s).    Radiographs show evidence of joint space narrowing, osteophytes, subchondral sclerosis and/or subchondral cysts.  This patient has knee pain which  interferes with functional and activities of daily living.    This patient has experienced inadequate response, adverse effects and/or intolerance with conservative treatments such as acetaminophen, NSAIDS, topical creams, physical therapy or regular exercise, knee bracing and/or weight loss.   This patient has experienced inadequate response or has a contraindication to intra articular steroid injections for at least 3 months.   This patient is not scheduled to have a total knee replacement within 6 months of starting treatment with viscosupplementation.   Follow-Up Instructions: Return in about 2 weeks (around 04/29/2021).   Orders:  Orders Placed This Encounter  Procedures   Large Joint Inj: R knee   No orders of the defined types were placed in this encounter.     Procedures: Large Joint Inj: R knee on 04/15/2021 3:47 PM Indications: pain and diagnostic evaluation Details: 25 G 1.5 in needle, anteromedial approach  Arthrogram: No  Medications: 2 mL lidocaine 1 %; 80 mg methylPREDNISolone acetate 40 MG/ML; 2 mL bupivacaine 0.25 % Procedure, treatment alternatives, risks and benefits explained, specific risks discussed. Consent was given by the patient. Immediately prior to procedure a time out was called to verify the correct patient, procedure, equipment, support staff and site/side marked as required. Patient was prepped and draped in the usual sterile fashion.      Clinical Data: No additional findings.   Subjective: Chief Complaint  Patient presents with   Right Knee - Follow-up  MRI review  Patient presents today for follow up on her right knee. She had an MRI and is here today for those results.  No change in symptoms.  HPI  Review of Systems   Objective: Vital Signs: There were no vitals taken for this visit.  Physical Exam Constitutional:      Appearance: She is well-developed.  Pulmonary:     Effort: Pulmonary effort is normal.  Skin:    General:  Skin is warm and dry.  Neurological:     Mental Status: She is alert and oriented to person, place, and time.  Psychiatric:        Behavior: Behavior normal.    Ortho Exam awake alert and oriented x3.  Comfortable sitting.  Full quick right knee extension of flexed at least 100 degrees without instability.  Mostly medial joint pain that was diffuse rather than localized along the posterior horn.  Minimal patella crepitation and no pain with patella compression.  No calf pain.  No popliteal discomfort.  There was a very minimal popliteal cyst on the MRI scan  Specialty Comments:  No specialty comments available.  Imaging: No results found.   PMFS History: Patient Active Problem List   Diagnosis Date Noted   Pain in right knee 12/12/2020   Anemia 04/09/2020   Carotid arterial disease ([facility]) 01/23/2019   RBBB (right bundle branch block with left anterior fascicular block)    Chest pain 01/22/2019   Weakness of both lower extremities 01/02/2019   Frequent falls 01/02/2019   OSA on CPAP 06/15/2017   Diabetes ([place]) 03/02/2017   Herpes zoster without complication 16/02/9603   B12 nutritional deficiency 02/23/2017   Vitamin D deficiency 12/08/2016   Tubular adenoma of colon 04/29/2016   Essential hypertension 09/12/2015   Allergic rhinitis 07/17/2015   Insomnia 07/17/2015   Obese 04/10/2015   S/P left TKA 04/09/2015   Osteopenia 12/04/2012   FAMILIAL TREMOR 11/18/2009   RESTLESS LEG SYNDROME 02/11/2009   LEG EDEMA, BILATERAL 02/11/2009   Hyperlipidemia 10/30/2008   Urinary incontinence 10/30/2008   Skin cancer 10/30/2008   Asthma 11/21/2007   Venous (peripheral) insufficiency 11/22/2006   Past Medical History:  Diagnosis Date   Allergic rhinitis    Apnea    Arthritis    Asthma    very mild   Back pain    Bilateral lower extremity edema    Carotid artery disease ([facility])    a. Carotid duplex 03/2016 - duplex was stable, 54-09% RICA, 8-11% LICA   Complication of anesthesia     post-op delirium   Endometrial polyp    Essential hypertension    GERD (gastroesophageal reflux disease)    Heart murmur    per pt   History of adenomatous polyp of colon    04-24-2016  tubular adenoma   History of squamous cell carcinoma excision    pilonidal area second excision 02-02-2002   HTN (hypertension)    Hyperlipidemia    Leg pain    OAB (overactive bladder)    RA (rheumatoid arthritis) ([facility])    RBBB (right bundle branch block with left anterior fascicular block)    RLS (restless legs syndrome)    Shortness of breath on exertion    SUI (stress urinary incontinence, female)    Venous insufficiency    legs   Vitamin D deficiency     Family History  Problem Relation Age of Onset   Esophageal cancer Father    Heart disease Father  Unknown what kind   Hyperlipidemia Father    Hypertension Father    Cancer Father    Heart failure Father    Hyperlipidemia Mother    Hypertension Mother    CAD Sister        68 years younger than patient - had stent. Was told that her anorexia/bulimia may have played a role   Stroke Maternal Grandmother 66   Diabetes Maternal Grandmother    Heart attack Maternal Grandfather 93   COPD Maternal Grandfather    Heart attack Paternal Grandmother        70s   Diabetes Other        [place]    Past Surgical History:  Procedure Laterality Date   APPENDECTOMY  07-02-2005   dr weatherly   open   CARDIAC CATHETERIZATION  01-23-2009  dr Darnell Level brodie   normal coronary arteries and lvf   CARDIOVASCULAR STRESS TEST  03/31/2016   Low risk nuclear study w/ medium defect of mild severity in basal anterior and mid anterior location w/ no evidence ischemia or infarction/  normal LV function and wall motion,  nuclear stress ef 71%   CATARACT EXTRACTION W/ INTRAOCULAR LENS IMPLANT Left 09/2016   COLONOSCOPY  last one 04-24-2016   HYSTEROSCOPY WITH D & C N/A 12/22/2016   Procedure: DILATATION AND CURETTAGE /HYSTEROSCOPY;  Surgeon: Dian Queen, MD;  Location: [facility];  Service: Gynecology;  Laterality: N/A;   IR US GUIDANCE  09/08/2016   POSTERIOR LUMBAR FUSION  02/ 2018    [facility] ([place])   SKIN CANCER EXCISION     TONSILLECTOMY  child   TOTAL KNEE ARTHROPLASTY Left 04/09/2015   Procedure: LEFT TOTAL KNEE ARTHROPLASTY;  Surgeon: Paralee Cancel, MD;  Location: [facility];  Service: Orthopedics;  Laterality: Left;   TRANSTHORACIC ECHOCARDIOGRAM  01/21/2009   mild LVH, ef 22-48%, grade 1 diastolic dysfunction   ULNAR NERVE TRANSPOSITION Left 12-20-2008  dr sypher   decompression and partial resection medial triceps fascia   WIDE LOCAL EXCISION PILONIDAL AREA  02-02-2002  [name]   recurrent squamous cell carcinoma in situ   Social History   Occupational History   Occupation: retired Pharmacist, hospital  Tobacco Use   Smoking status: Former    Packs/day: 0.50    Years: 10.00    Pack years: 5.00    Types: Cigarettes    Quit date: 12/10/1973    Years since quitting: 47.3   Smokeless tobacco: Never  Vaping Use   Vaping Use: Never used  Substance and Sexual Activity   Alcohol use: Yes    Comment: occasional   Drug use: No   Sexual activity: Not on file

## 2021-04-15 NOTE — Telephone Encounter (Signed)
Noted  

## 2021-04-16 ENCOUNTER — Other Ambulatory Visit (INDEPENDENT_AMBULATORY_CARE_PROVIDER_SITE_OTHER): Payer: Self-pay | Admitting: Family Medicine

## 2021-04-16 ENCOUNTER — Other Ambulatory Visit: Payer: Self-pay | Admitting: Internal Medicine

## 2021-04-16 ENCOUNTER — Encounter: Payer: Self-pay | Admitting: Internal Medicine

## 2021-04-16 DIAGNOSIS — E119 Type 2 diabetes mellitus without complications: Secondary | ICD-10-CM

## 2021-04-17 DIAGNOSIS — L603 Nail dystrophy: Secondary | ICD-10-CM | POA: Diagnosis not present

## 2021-04-17 DIAGNOSIS — B351 Tinea unguium: Secondary | ICD-10-CM | POA: Diagnosis not present

## 2021-04-17 DIAGNOSIS — I739 Peripheral vascular disease, unspecified: Secondary | ICD-10-CM | POA: Diagnosis not present

## 2021-04-18 ENCOUNTER — Encounter: Payer: Self-pay | Admitting: Orthopaedic Surgery

## 2021-04-18 ENCOUNTER — Other Ambulatory Visit (INDEPENDENT_AMBULATORY_CARE_PROVIDER_SITE_OTHER): Payer: Self-pay | Admitting: Family Medicine

## 2021-04-18 DIAGNOSIS — E119 Type 2 diabetes mellitus without complications: Secondary | ICD-10-CM

## 2021-04-18 MED ORDER — ACCU-CHEK GUIDE W/DEVICE KIT
PACK | 0 refills | Status: DC
Start: 2021-04-18 — End: 2022-02-10

## 2021-04-18 MED ORDER — ACCU-CHEK SOFTCLIX LANCETS MISC: 12 refills | Status: DC

## 2021-04-18 MED ORDER — ACCU-CHEK GUIDE VI STRP
ORAL_STRIP | 12 refills | Status: DC
Start: 2021-04-18 — End: 2021-08-04

## 2021-04-24 ENCOUNTER — Encounter: Payer: Self-pay | Admitting: Internal Medicine

## 2021-04-24 DIAGNOSIS — M545 Low back pain, unspecified: Secondary | ICD-10-CM

## 2021-04-25 MED ORDER — HYDROCODONE-ACETAMINOPHEN 5-325 MG PO TABS: 1.0000 | ORAL_TABLET | Freq: Every day | ORAL | 0 refills | Status: DC

## 2021-04-29 ENCOUNTER — Ambulatory Visit: Payer: Medicare PPO | Admitting: Orthopaedic Surgery

## 2021-04-29 ENCOUNTER — Other Ambulatory Visit: Payer: Self-pay

## 2021-04-29 DIAGNOSIS — E785 Hyperlipidemia, unspecified: Secondary | ICD-10-CM

## 2021-04-29 NOTE — Progress Notes (Signed)
Subjective:    Patient ID: Erin Roach, female    DOB: Dec 10, 1940, 80 y.o.   MRN: [id number]  This visit occurred during the SARS-CoV-2 public health emergency.  Safety protocols were in place, including screening questions prior to the visit, additional usage of staff PPE, and extensive cleaning of exam room while observing appropriate contact time as indicated for disinfecting solutions.    HPI The patient is here for an acute visit.   lightheadedness and loss of balance x 1 month.  She has had this in the past  - About one year ago she fell from lightheadedness..  she does not think she is getting up too quickly.  She denies spinning sensation.    BP has been ok at home.  She is not interested in most foods. She often does not eat lunch.   It happens a lot at night.  She is trying to drink more water.    She has had a couple of headaches recently.  One headache this week - very strong, transient.   No pattern to when this occurs.  Last couple of days has been better - she has not had any lightheadedness  - she has not had lightheadedness.   Medications and allergies reviewed with patient and updated if appropriate.  Patient Active Problem List   Diagnosis Date Noted   Pain in right knee 12/12/2020   Anemia 04/09/2020   Carotid arterial disease ([place]) 01/23/2019   RBBB (right bundle branch block with left anterior fascicular block)    Chest pain 01/22/2019   Weakness of both lower extremities 01/02/2019   Frequent falls 01/02/2019   OSA on CPAP 06/15/2017   Diabetes ([facility]) 03/02/2017   Herpes zoster without complication 70/48/8891   B12 nutritional deficiency 02/23/2017   Vitamin D deficiency 12/08/2016   Tubular adenoma of colon 04/29/2016   Essential hypertension 09/12/2015   Allergic rhinitis 07/17/2015   Insomnia 07/17/2015   Obese 04/10/2015   S/P left TKA 04/09/2015   Osteopenia 12/04/2012   FAMILIAL TREMOR 11/18/2009   RESTLESS LEG SYNDROME 02/11/2009    LEG EDEMA, BILATERAL 02/11/2009   Hyperlipidemia 10/30/2008   Urinary incontinence 10/30/2008   Skin cancer 10/30/2008   Asthma 11/21/2007   Venous (peripheral) insufficiency 11/22/2006    Current Outpatient Medications on File Prior to Visit  Medication Sig Dispense Refill   ACCU-CHEK GUIDE test strip TEST TWICE A DAY 100 strip 0   Accu-Chek Softclix Lancets lancets Use to help check blood sugars twice a day 100 each 12   amitriptyline (ELAVIL) 50 MG tablet Take 1 tablet (50 mg total) by mouth at bedtime.     Blood Glucose Monitoring Suppl (ACCU-CHEK GUIDE) w/Device KIT Use to check blood sugars daily 1 kit 0   budesonide-formoterol (SYMBICORT) 160-4.5 MCG/ACT inhaler Inhale 2 puffs into the lungs daily. 1 Inhaler 5   ergocalciferol (VITAMIN D2) 1.25 MG (50000 UT) capsule Take 1 capsule (50,000 Units total) by mouth once a week. Take one tablet by mouth on Mondays 12 capsule 0   fluticasone (FLONASE) 50 MCG/ACT nasal spray Place 1 spray into both nostrils daily as needed for allergies. 11.1 mL 0   furosemide (LASIX) 20 MG tablet Take 2 tablets (40 mg total) by mouth daily. 180 tablet 3   glucose blood (ACCU-CHEK GUIDE) test strip Use to check blood sugars twice a day 100 each 12   HORIZANT 600 MG TBCR Take 1 tablet by mouth at bedtime.  hydrALAZINE (APRESOLINE) 100 MG tablet Take 1 tablet (100 mg total) by mouth 3 (three) times daily. 270 tablet 3   HYDROcodone-acetaminophen (NORCO/VICODIN) 5-325 MG tablet Take 1-2 tablets by mouth at bedtime. 60 tablet 0   Lancets (ONETOUCH ULTRASOFT) lancets Use as instructed 100 each 0   lidocaine (LIDODERM) 5 % Place 1 patch onto the skin daily. Remove & Discard patch within 12 hours or as directed by MD 30 patch 0   losartan (COZAAR) 100 MG tablet TAKE ONE TABLET BY MOUTH DAILY 90 tablet 1   metFORMIN (GLUCOPHAGE) 500 MG tablet TAKE 1 TABLET BY MOUTH TWICE A DAY WITH MEALS 180 tablet 1   pregabalin (LYRICA) 75 MG capsule TAKE ONE CAPSULE BY MOUTH  EVERY AFTERNOON AND TAKE TWO CAPSULES BY MOUTH EVERY NIGHT AT BEDTIME 270 capsule 0   rOPINIRole (REQUIP) 0.25 MG tablet Take 1-2 tablets (0.25-0.5 mg total) by mouth at bedtime. 60 tablet 5   rosuvastatin (CRESTOR) 20 MG tablet Take 1 tablet (20 mg total) by mouth daily. 90 tablet 3   spironolactone (ALDACTONE) 25 MG tablet Take 0.5 tablets (12.5 mg total) by mouth daily.     TECHLITE PEN NEEDLES 32G X 4 MM MISC USE TWICE A DAY AS DIRECTED 100 each 2   VICTOZA 18 MG/3ML SOPN DIAL AND INJECT SUBCUTANEOUSLY 1.8MG DAILY EVERY MORNING 3 mL 3   ezetimibe (ZETIA) 10 MG tablet Take 1 tablet (10 mg total) by mouth daily. 90 tablet 3   No current facility-administered medications on file prior to visit.    Past Medical History:  Diagnosis Date   Allergic rhinitis    Apnea    Arthritis    Asthma    very mild   Back pain    Bilateral lower extremity edema    Carotid artery disease ([facility])    a. Carotid duplex 03/2016 - duplex was stable, 77-93% RICA, 9-03% LICA   Complication of anesthesia    post-op delirium   Endometrial polyp    Essential hypertension    GERD (gastroesophageal reflux disease)    Heart murmur    per pt   History of adenomatous polyp of colon    04-24-2016  tubular adenoma   History of squamous cell carcinoma excision    pilonidal area second excision 02-02-2002   HTN (hypertension)    Hyperlipidemia    Leg pain    OAB (overactive bladder)    RA (rheumatoid arthritis) (HCC)    RBBB (right bundle branch block with left anterior fascicular block)    RLS (restless legs syndrome)    Shortness of breath on exertion    SUI (stress urinary incontinence, female)    Venous insufficiency    legs   Vitamin D deficiency     Past Surgical History:  Procedure Laterality Date   APPENDECTOMY  07-02-2005   [name]   open   CARDIAC CATHETERIZATION  01-23-2009  dr Darnell Level brodie   normal coronary arteries and lvf   CARDIOVASCULAR STRESS TEST  03/31/2016   Low risk nuclear  study w/ medium defect of mild severity in basal anterior and mid anterior location w/ no evidence ischemia or infarction/  normal LV function and wall motion,  nuclear stress ef 71%   CATARACT EXTRACTION W/ INTRAOCULAR LENS IMPLANT Left 09/2016   COLONOSCOPY  last one 04-24-2016   HYSTEROSCOPY WITH D & C N/A 12/22/2016   Procedure: DILATATION AND CURETTAGE /HYSTEROSCOPY;  Surgeon: Dian Queen, MD;  Location: [facility];  Service: Gynecology;  Laterality: N/A;   IR US GUIDANCE  09/08/2016   POSTERIOR LUMBAR FUSION  02/ 2018    [facility] ([place], [place])   SKIN CANCER EXCISION     TONSILLECTOMY  child   TOTAL KNEE ARTHROPLASTY Left 04/09/2015   Procedure: LEFT TOTAL KNEE ARTHROPLASTY;  Surgeon: Paralee Cancel, MD;  [facility];  Service: Orthopedics;  Laterality: Left;   TRANSTHORACIC ECHOCARDIOGRAM  01/21/2009   mild LVH, ef 79-03%, grade 1 diastolic dysfunction   ULNAR NERVE TRANSPOSITION Left 12-20-2008  dr sypher   decompression and partial resection medial triceps fascia   WIDE LOCAL EXCISION PILONIDAL AREA  02-02-2002  [name]   recurrent squamous cell carcinoma in situ    Social History   Socioeconomic History   Marital status: Married    Spouse name: Clare Gandy   Number of children: 2   Years of education: Not on file   Highest education level: Not on file  Occupational History   Occupation: retired Pharmacist, hospital  Tobacco Use   Smoking status: Former    Packs/day: 0.50    Years: 10.00    Pack years: 5.00    Types: Cigarettes    Quit date: 12/10/1973    Years since quitting: 47.4   Smokeless tobacco: Never  Vaping Use   Vaping Use: Never used  Substance and Sexual Activity   Alcohol use: Yes    Comment: occasional   Drug use: No   Sexual activity: Not on file  Other Topics Concern   Not on file  Social History Narrative   2 children ages 56,35   Social Determinants of Health   Financial Resource Strain: Not on file  Food Insecurity: Not on  file  Transportation Needs: Not on file  Physical Activity: Not on file  Stress: Not on file  Social Connections: Not on file    Family History  Problem Relation Age of Onset   Esophageal cancer Father    Heart disease Father        Unknown what kind   Hyperlipidemia Father    Hypertension Father    Cancer Father    Heart failure Father    Hyperlipidemia Mother    Hypertension Mother    CAD Sister        67 years younger than patient - had stent. Was told that her anorexia/bulimia may have played a role   Stroke Maternal Grandmother 66   Diabetes Maternal Grandmother    Heart attack Maternal Grandfather 93   COPD Maternal Grandfather    Heart attack Paternal Grandmother        70s   Diabetes Other        [place]    Review of Systems  Constitutional:  Negative for fever.  Respiratory:  Negative for cough, shortness of breath and wheezing.   Cardiovascular:  Positive for leg swelling (chronic - less). Negative for chest pain and palpitations.  Gastrointestinal:  Negative for abdominal pain and nausea.  Neurological:  Positive for light-headedness. Negative for headaches.      Objective:   Vitals:   04/30/21 1406  BP: 138/70  Pulse: (!) 102  Temp: 97.9 F (36.6 C)  SpO2: 98%   BP Readings from Last 3 Encounters:  04/30/21 138/70  01/02/21 132/80  10/01/20 127/62   Wt Readings from Last 3 Encounters:  04/30/21 197 lb (89.4 kg)  01/02/21 203 lb 12.8 oz (92.4 kg)  12/12/20 190 lb (86.2 kg)   Body mass index is  31.8 kg/m.   Physical Exam    Constitutional: Appears well-developed and well-nourished. No distress.  Head: Normocephalic and atraumatic.  Neck: Neck supple. No tracheal deviation present. No thyromegaly present.  No cervical lymphadenopathy Ears: normal b/l ear canals and TMs Cardiovascular: Normal rate, regular rhythm and normal heart sounds.  No murmur heard. No carotid bruit .  Mild b/l LE edema Pulmonary/Chest: Effort normal and breath sounds  normal. No respiratory distress. No has no wheezes. No rales.  Skin: Skin is warm and dry. Not diaphoretic.  Psychiatric: Normal mood and affect. Behavior is normal.       Assessment & Plan:    See Problem List for Assessment and Plan of chronic medical problems.

## 2021-04-30 ENCOUNTER — Ambulatory Visit: Payer: Medicare PPO | Admitting: Internal Medicine

## 2021-04-30 ENCOUNTER — Other Ambulatory Visit: Payer: Self-pay

## 2021-04-30 ENCOUNTER — Encounter: Payer: Self-pay | Admitting: Internal Medicine

## 2021-04-30 VITALS — BP 138/70 | HR 102 | Temp 97.9°F | Ht 66.0 in | Wt 197.0 lb

## 2021-04-30 DIAGNOSIS — I10 Essential (primary) hypertension: Secondary | ICD-10-CM | POA: Diagnosis not present

## 2021-04-30 DIAGNOSIS — E538 Deficiency of other specified B group vitamins: Secondary | ICD-10-CM

## 2021-04-30 DIAGNOSIS — E1129 Type 2 diabetes mellitus with other diabetic kidney complication: Secondary | ICD-10-CM | POA: Diagnosis not present

## 2021-04-30 DIAGNOSIS — R42 Dizziness and giddiness: Secondary | ICD-10-CM | POA: Insufficient documentation

## 2021-04-30 DIAGNOSIS — R809 Proteinuria, unspecified: Secondary | ICD-10-CM | POA: Diagnosis not present

## 2021-04-30 LAB — COMPREHENSIVE METABOLIC PANEL
ALT: 23 U/L (ref 0–35)
AST: 18 U/L (ref 0–37)
Albumin: 4 g/dL (ref 3.5–5.2)
Alkaline Phosphatase: 57 U/L (ref 39–117)
BUN: 26 mg/dL — ABNORMAL HIGH (ref 6–23)
CO2: 25 mEq/L (ref 19–32)
Calcium: 9 mg/dL (ref 8.4–10.5)
Chloride: 103 mEq/L (ref 96–112)
Creatinine, Ser: 1.13 mg/dL (ref 0.40–1.20)
GFR: 46.66 mL/min — ABNORMAL LOW (ref >60.00)
Glucose, Bld: 91 mg/dL (ref 70–99)
Potassium: 4.6 mEq/L (ref 3.5–5.1)
Sodium: 137 mEq/L (ref 135–145)
Total Bilirubin: 0.3 mg/dL (ref 0.2–1.2)
Total Protein: 6.9 g/dL (ref 6.0–8.3)

## 2021-04-30 LAB — BASIC METABOLIC PANEL
BUN: 26 mg/dL — ABNORMAL HIGH (ref 6–23)
CO2: 25 mEq/L (ref 19–32)
Calcium: 9 mg/dL (ref 8.4–10.5)
Chloride: 103 mEq/L (ref 96–112)
Creatinine, Ser: 1.13 mg/dL (ref 0.40–1.20)
GFR: 46.66 mL/min — ABNORMAL LOW (ref >60.00)
Glucose, Bld: 91 mg/dL (ref 70–99)
Potassium: 4.6 mEq/L (ref 3.5–5.1)
Sodium: 137 mEq/L (ref 135–145)

## 2021-04-30 LAB — CBC WITH DIFFERENTIAL/PLATELET
Basophils Absolute: 0.1 10*3/uL (ref 0.0–0.1)
Basophils Relative: 0.8 % (ref 0.0–3.0)
Eosinophils Absolute: 0.3 10*3/uL (ref 0.0–0.7)
Eosinophils Relative: 2.9 % (ref 0.0–5.0)
HCT: 34.3 % — ABNORMAL LOW (ref 36.0–46.0)
Hemoglobin: 11.7 g/dL — ABNORMAL LOW (ref 12.0–15.0)
Lymphocytes Relative: 19.8 % (ref 12.0–46.0)
Lymphs Abs: 2.1 10*3/uL (ref 0.7–4.0)
MCHC: 34 g/dL (ref 30.0–36.0)
MCV: 85.7 fl (ref 78.0–100.0)
Monocytes Absolute: 0.8 10*3/uL (ref 0.1–1.0)
Monocytes Relative: 8 % (ref 3.0–12.0)
Neutro Abs: 7.2 10*3/uL (ref 1.4–7.7)
Neutrophils Relative %: 68.5 % (ref 43.0–77.0)
Platelets: 277 10*3/uL (ref 150.0–400.0)
RBC: 4.01 Mil/uL (ref 3.87–5.11)
RDW: 13.5 % (ref 11.5–15.5)
WBC: 10.5 10*3/uL (ref 4.0–10.5)

## 2021-04-30 LAB — VITAMIN B12: Vitamin B-12: 229 pg/mL (ref 211–911)

## 2021-04-30 LAB — HEMOGLOBIN A1C: Hgb A1c MFr Bld: 6.2 % (ref 4.6–6.5)

## 2021-04-30 NOTE — Patient Instructions (Addendum)
  Blood work was ordered.     Medications changes include :   none     

## 2021-04-30 NOTE — Assessment & Plan Note (Signed)
Acute Started one month ago - no pattern, not orthostatic in nature Intermittent - mostly at night ? Related to not eating lunch No change in medications She will start to keep a better log of her symptoms Ck cbc, cmp, a1c, b12

## 2021-04-30 NOTE — Assessment & Plan Note (Signed)
Chronic Not taking B12  Check level

## 2021-04-30 NOTE — Assessment & Plan Note (Signed)
Chronic BP well controlled Continue lasix 40 mg daily, hydralazine 100 mg tid, losartan 100 mg daily, spironolactone 12.5 mg daily cmp

## 2021-04-30 NOTE — Assessment & Plan Note (Signed)
Chronic Unlikely low sugar - she has checked her sugar at home and it has been ok a1c

## 2021-05-08 ENCOUNTER — Other Ambulatory Visit: Payer: Self-pay | Admitting: Internal Medicine

## 2021-05-15 ENCOUNTER — Telehealth: Payer: Self-pay

## 2021-05-15 NOTE — Telephone Encounter (Signed)
VOB submitted for Monovisc, right knee BV pending  

## 2021-05-16 ENCOUNTER — Telehealth: Payer: Self-pay

## 2021-05-16 NOTE — Telephone Encounter (Signed)
Tried calling patient to schedule for gel injection, but no answer and VM was full.  Please schedule with Dr. Durward Fortes  Approved for Orthovisc, right knee. [place] Once OOP has been met, patient will be covered at 100%. Co-pay $40.00, may be per visit No PA required

## 2021-05-17 ENCOUNTER — Other Ambulatory Visit: Payer: Self-pay | Admitting: Internal Medicine

## 2021-05-17 DIAGNOSIS — E119 Type 2 diabetes mellitus without complications: Secondary | ICD-10-CM

## 2021-06-01 ENCOUNTER — Encounter: Payer: Self-pay | Admitting: Internal Medicine

## 2021-06-01 DIAGNOSIS — M545 Low back pain, unspecified: Secondary | ICD-10-CM

## 2021-06-02 MED ORDER — HYDROCODONE-ACETAMINOPHEN 5-325 MG PO TABS: 1.0000 | ORAL_TABLET | Freq: Every day | ORAL | 0 refills | Status: DC

## 2021-06-26 ENCOUNTER — Telehealth: Payer: Self-pay | Admitting: Cardiovascular Disease

## 2021-06-26 NOTE — Telephone Encounter (Signed)
Pt c/o swelling: STAT is pt has developed SOB within 24 hours  If swelling, where is the swelling located? Ankles and legs  How much weight have you gained and in what time span? no  Have you gained 3 pounds in a day or 5 pounds in a week? no  Do you have a log of your daily weights (if so, list)? no  Are you currently taking a fluid pill? yes  Are you currently SOB? no  Have you traveled recently? no   Patient states she has swelling in her ankles and legs. She says it is so bad that they feel tight and it is painful.

## 2021-06-26 NOTE — Telephone Encounter (Signed)
LMTCB

## 2021-06-27 NOTE — Telephone Encounter (Signed)
If records are correct she is taking furosemide 40 mg daily and spironolactone 12.5 mg daily. Please have her increase furosemide to 60 mg daily and increase spironolactone to 25 mg daily. Please call us back after the weekend to let us know what her blood pressure is, how the swelling is coming along and how much she weighs compared to today. If she needs to stay on this regimen of diuretics longer-term to control the edema, we will need to check a basic metabolic panel in about 2-3 weeks. Thank you

## 2021-06-27 NOTE — Telephone Encounter (Signed)
Patient returning call from yesterday

## 2021-06-27 NOTE — Telephone Encounter (Signed)
Spoke with patient who reports pitting edema in legs/ankles since last week. Support hose and elevating legs are not working to decrease the edema. Denies sob or chest pain. While on phone, she checked BP 161/79, P 87. She has not made any changes in diet. Please advise.

## 2021-06-27 NOTE — Telephone Encounter (Signed)
Patient has been made aware. She will increase the medications through the weekend, check her weight daily and call back Monday with an update.  Medications doses have not been changed in epic until an update on Monday.

## 2021-06-28 ENCOUNTER — Other Ambulatory Visit: Payer: Self-pay | Admitting: Cardiovascular Disease

## 2021-06-28 ENCOUNTER — Other Ambulatory Visit: Payer: Self-pay | Admitting: Internal Medicine

## 2021-06-30 ENCOUNTER — Encounter: Payer: Self-pay | Admitting: Cardiovascular Disease

## 2021-06-30 ENCOUNTER — Other Ambulatory Visit: Payer: Self-pay

## 2021-06-30 MED ORDER — FUROSEMIDE 20 MG PO TABS: 40.0000 mg | ORAL_TABLET | Freq: Every day | ORAL | 0 refills | Status: DC

## 2021-06-30 MED ORDER — SPIRONOLACTONE 25 MG PO TABS: 12.5000 mg | ORAL_TABLET | Freq: Every day | ORAL | Status: DC

## 2021-06-30 NOTE — Telephone Encounter (Signed)
Left message for patient to return the call .  Before  refilling medication .  Patient spoke to office  06/26/21 concerning edema.   Medication  was changed at that time.  Patient was suppose to call office to give an update.     Croitoru, Dani Gobble, MD to Ricci Barker, RN  Kathreen Devoid, RN  2:48 PM Note If records are correct she is taking furosemide 40 mg daily and spironolactone 12.5 mg daily. Please have her increase furosemide to 60 mg daily and increase spironolactone to 25 mg daily. Please call us back after the weekend to let us know what her blood pressure is, how the swelling is coming along and how much she weighs compared to today. If she needs to stay on this regimen of diuretics longer-term to control the edema, we will need to check a basic metabolic panel in about 2-3 weeks. Thank you       Ricci Barker, RN      2:58 PM Note Patient has been made aware. She will increase the medications through the weekend, check her weight daily and call back Monday with an update.   Medications doses have not been changed in epic until an update on Monday.

## 2021-07-01 ENCOUNTER — Other Ambulatory Visit: Payer: Self-pay

## 2021-07-01 ENCOUNTER — Other Ambulatory Visit: Payer: Self-pay | Admitting: *Deleted

## 2021-07-01 MED ORDER — SPIRONOLACTONE 25 MG PO TABS: 25.0000 mg | ORAL_TABLET | Freq: Every day | ORAL | 3 refills | Status: DC

## 2021-07-02 NOTE — Telephone Encounter (Signed)
Left a message for the patient to call back.  

## 2021-07-03 ENCOUNTER — Telehealth: Payer: Self-pay | Admitting: Cardiovascular Disease

## 2021-07-03 NOTE — Telephone Encounter (Signed)
LMTCB

## 2021-07-03 NOTE — Telephone Encounter (Signed)
Pt c/o swelling: STAT is pt has developed SOB within 24 hours  If swelling, where is the swelling located? Legs  How much weight have you gained and in what time span? Has not gained at all   Have you gained 3 pounds in a day or 5 pounds in a week? No   Do you have a log of your daily weights (if so, list)?  Has been 207 lbs for week   Are you currently taking a fluid pill? Yes  Are you currently SOB? No   Have you traveled recently? No    Pt c/o BP issue: STAT if pt c/o blurred vision, one-sided weakness or slurred speech  1. What are your last 5 BP readings?  02/21 161/79  02/23 127/70  2. Are you having any other symptoms (ex. Dizziness, headache, blurred vision, passed out)? Legs hurt from swelling   3. What is your BP issue? Was advised to keep up with BP and weight.

## 2021-07-04 MED ORDER — FUROSEMIDE 20 MG PO TABS: 40.0000 mg | ORAL_TABLET | Freq: Every day | ORAL | 0 refills | Status: DC

## 2021-07-04 NOTE — Addendum Note (Signed)
Addended by: Waylan Rocher on: 07/04/2021 02:59 PM   Modules accepted: Orders

## 2021-07-04 NOTE — Telephone Encounter (Signed)
Spoke with pt she states that her legs are so swollen that they hurt. She states that she does not usually take her weight but eill start taking daily. Her creatinine is in the normal range so pt may take extra lasix 20mg  X2 days. She will call back if this does not relieve the swelling/pain on Monday.

## 2021-07-04 NOTE — Telephone Encounter (Signed)
Patient is returning phone call.  °

## 2021-07-07 ENCOUNTER — Encounter: Payer: Self-pay | Admitting: *Deleted

## 2021-07-07 NOTE — Telephone Encounter (Signed)
My Chart message sent

## 2021-07-07 NOTE — Telephone Encounter (Signed)
Left a message for the patient to call back with an [place]

## 2021-07-08 ENCOUNTER — Encounter: Payer: Self-pay | Admitting: Cardiovascular Disease

## 2021-07-08 ENCOUNTER — Encounter: Payer: Self-pay | Admitting: Internal Medicine

## 2021-07-08 DIAGNOSIS — M545 Low back pain, unspecified: Secondary | ICD-10-CM

## 2021-07-09 DIAGNOSIS — G2581 Restless legs syndrome: Secondary | ICD-10-CM | POA: Diagnosis not present

## 2021-07-09 DIAGNOSIS — G4733 Obstructive sleep apnea (adult) (pediatric): Secondary | ICD-10-CM | POA: Diagnosis not present

## 2021-07-09 DIAGNOSIS — E669 Obesity, unspecified: Secondary | ICD-10-CM | POA: Diagnosis not present

## 2021-07-09 MED ORDER — HYDROCODONE-ACETAMINOPHEN 5-325 MG PO TABS: 1.0000 | ORAL_TABLET | Freq: Every day | ORAL | 0 refills | Status: DC

## 2021-07-10 NOTE — Telephone Encounter (Signed)
Tried to call pt, LM2CB to discuss Med change & appt ?

## 2021-07-10 NOTE — Telephone Encounter (Signed)
Please go ahead and increase the furosemide to 80 mg daily. We can get the repeat BMET and a BNP when back from trip to beach, but preferably at least one day before the appointment on 3/10. Please remind her to avoid excessive salt in diet (especially aproablem when eating out). ?

## 2021-07-16 ENCOUNTER — Other Ambulatory Visit: Payer: Self-pay | Admitting: Internal Medicine

## 2021-07-16 DIAGNOSIS — E119 Type 2 diabetes mellitus without complications: Secondary | ICD-10-CM

## 2021-07-23 ENCOUNTER — Encounter: Payer: Self-pay | Admitting: Orthopaedic Surgery

## 2021-07-23 ENCOUNTER — Ambulatory Visit: Payer: Medicare PPO | Admitting: Orthopaedic Surgery

## 2021-07-23 ENCOUNTER — Other Ambulatory Visit: Payer: Self-pay

## 2021-07-23 DIAGNOSIS — M170 Bilateral primary osteoarthritis of knee: Secondary | ICD-10-CM | POA: Insufficient documentation

## 2021-07-23 DIAGNOSIS — M1711 Unilateral primary osteoarthritis, right knee: Secondary | ICD-10-CM | POA: Diagnosis not present

## 2021-07-23 MED ORDER — METHYLPREDNISOLONE ACETATE 40 MG/ML IJ SUSP
80.0000 mg | INTRAMUSCULAR | Status: AC | PRN
  Administered 2021-07-23: 80 mg via INTRA_ARTICULAR

## 2021-07-23 MED ORDER — BUPIVACAINE HCL 0.25 % IJ SOLN
2.0000 mL | INTRAMUSCULAR | Status: AC | PRN
  Administered 2021-07-23: 2 mL via INTRA_ARTICULAR

## 2021-07-23 MED ORDER — LIDOCAINE HCL 1 % IJ SOLN
2.0000 mL | INTRAMUSCULAR | Status: AC | PRN
  Administered 2021-07-23: 2 mL

## 2021-07-23 NOTE — Progress Notes (Signed)
? ?Office Visit Note ?  ?Patient: Erin Roach           ?Date of Birth: 05/09/1941           ?MRN: [id number] ?Visit Date: 07/23/2021 ?             ?Requested by: Binnie Rail, MD ?[address] [place],  [place] [address] ?PCP: Binnie Rail, MD ? ? ?Assessment & Plan: ?Visit Diagnoses:  ?1. Bilateral primary osteoarthritis of knee   ? ? ?Plan: Right knee osteoarthritis.  Had an excellent response to intra-articular cortisone injection 3 months ago.  We will plan to repeat the same injection today and follow-up in 2 weeks to start viscosupplementation which has already been approved ? ?Follow-Up Instructions: Return in about 2 weeks (around 08/06/2021).  ? ?Orders:  ?No orders of the defined types were placed in this encounter. ? ?No orders of the defined types were placed in this encounter. ? ? ? ? Procedures: ?Large Joint Inj: R knee on 07/23/2021 3:34 PM ?Indications: pain and diagnostic evaluation ?Details: 25 G 1.5 in needle, anteromedial approach ? ?Arthrogram: No ? ?Medications: 2 mL lidocaine 1 %; 2 mL bupivacaine 0.25 %; 80 mg methylPREDNISolone acetate 40 MG/ML ?Procedure, treatment alternatives, risks and benefits explained, specific risks discussed. Consent was given by the patient. Immediately prior to procedure a time out was called to verify the correct patient, procedure, equipment, support staff and site/side marked as required. Patient was prepped and draped in the usual sterile fashion.  ? ? ? ? ?Clinical Data: ?No additional findings. ? ? ?Subjective: ?Chief Complaint  ?Patient presents with  ? Right Knee - Pain  ?Patient presents today for a right knee injection. She was approved for Orthovisc in January but wants to speak with Dr.Raydan Schlabach about the pros and cons of each type of injection. ? ?HPI ? ?Review of Systems ? ? ?Objective: ?Vital Signs: There were no vitals taken for this visit. ? ?Physical Exam ?Constitutional:   ?   Appearance: She is well-developed.  ?Pulmonary:  ?   Effort:  Pulmonary effort is normal.  ?Skin: ?   General: Skin is warm and dry.  ?Neurological:  ?   Mental Status: She is alert and oriented to person, place, and time.  ?Psychiatric:     ?   Behavior: Behavior normal.  ? ? ?Ortho Exam awake alert and oriented x3.  Comfortable sitting.  Right knee with pain mostly along the medial compartment that was relatively mild.  Some patella crepitation but no pain with compression.  Full extension and flexed over 100 degrees without instability. ? ?Specialty Comments:  ?No specialty comments available. ? ?Imaging: ?No results found. ? ? ?PMFS History: ?Patient Active Problem List  ? Diagnosis Date Noted  ? Bilateral primary osteoarthritis of knee 07/23/2021  ? Episodic lightheadedness 04/30/2021  ? Pain in right knee 12/12/2020  ? Anemia 04/09/2020  ? Carotid arterial disease ([place]) 01/23/2019  ? RBBB (right bundle branch block with left anterior fascicular block)   ? Chest pain 01/22/2019  ? Weakness of both lower extremities 01/02/2019  ? Frequent falls 01/02/2019  ? OSA on CPAP 06/15/2017  ? Diabetes ([place]) 03/02/2017  ? Herpes zoster without complication 25/85/2778  ? B12 nutritional deficiency 02/23/2017  ? Vitamin D deficiency 12/08/2016  ? Tubular adenoma of colon 04/29/2016  ? Essential hypertension 09/12/2015  ? Allergic rhinitis 07/17/2015  ? Insomnia 07/17/2015  ? Obese 04/10/2015  ? S/P left TKA 04/09/2015  ?  Osteopenia 12/04/2012  ? 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 RESTLESS LEG SYNDROME 02/11/2009  ? LEG EDEMA, BILATERAL 02/11/2009  ? Hyperlipidemia 10/30/2008  ? Urinary incontinence 10/30/2008  ? Skin cancer 10/30/2008  ? Asthma 11/21/2007  ? Venous (peripheral) insufficiency 11/22/2006  ? ?Past Medical History:  ?Diagnosis Date  ? Allergic rhinitis   ? Apnea   ? Arthritis   ? Asthma   ? very mild  ? Back pain   ? Bilateral lower extremity edema   ? Carotid artery disease ([place])   ? a. Carotid duplex 03/2016 - duplex was stable, 76-22% RICA, 6-33% LICA  ? Complication of  anesthesia   ? post-op delirium  ? Endometrial polyp   ? Essential hypertension   ? GERD (gastroesophageal reflux disease)   ? Heart murmur   ? per pt  ? History of adenomatous polyp of colon   ? 04-24-2016  tubular adenoma  ? History of squamous cell carcinoma excision   ? pilonidal area second excision 02-02-2002  ? HTN (hypertension)   ? Hyperlipidemia   ? Leg pain   ? OAB (overactive bladder)   ? RA (rheumatoid arthritis) ([place])   ? RBBB (right bundle branch block with left anterior fascicular block)   ? RLS (restless legs syndrome)   ? Shortness of breath on exertion   ? SUI (stress urinary incontinence, female)   ? Venous insufficiency   ? legs  ? Vitamin D deficiency   ?  ?Family History  ?Problem Relation Age of Onset  ? Esophageal cancer Father   ? Heart disease Father   ?     Unknown what kind  ? Hyperlipidemia Father   ? Hypertension Father   ? Cancer Father   ? Heart failure Father   ? Hyperlipidemia Mother   ? Hypertension Mother   ? CAD Sister   ?     10 years younger than patient - had stent. Was told that her anorexia/bulimia may have played a role  ? Stroke Maternal Grandmother 39  ? Diabetes Maternal Grandmother   ? Heart attack Maternal Grandfather 93  ? COPD Maternal Grandfather   ? Heart attack Paternal Grandmother   ?     67s  ? Diabetes Other   ?     MGGM  ?  ?Past Surgical History:  ?Procedure Laterality Date  ? APPENDECTOMY  07-02-2005   [name]  ? open  ? CARDIAC CATHETERIZATION  01-23-2009  dr Darnell Level brodie  ? normal coronary arteries and lvf  ? CARDIOVASCULAR STRESS TEST  03/31/2016  ? Low risk nuclear study w/ medium defect of mild severity in basal anterior and mid anterior location w/ no evidence ischemia or infarction/  normal LV function and wall motion,  nuclear stress ef 71%  ? CATARACT EXTRACTION W/ INTRAOCULAR LENS IMPLANT Left 09/2016  ? COLONOSCOPY  last one 04-24-2016  ? HYSTEROSCOPY WITH D & C N/A 12/22/2016  ? Procedure: DILATATION AND CURETTAGE /HYSTEROSCOPY;  Surgeon:  Dian Queen, MD;  [facility];  Service: Gynecology;  Laterality: N/A;  ? IR US GUIDANCE  09/08/2016  ? POSTERIOR LUMBAR FUSION  02/ [facility] ([place], [place])  ? SKIN CANCER EXCISION    ? TONSILLECTOMY  child  ? TOTAL KNEE ARTHROPLASTY Left 04/09/2015  ? Procedure: LEFT TOTAL KNEE ARTHROPLASTY;  Surgeon: Paralee Cancel, MD;  Location: [facility];  Service: Orthopedics;  Laterality: Left;  ? TRANSTHORACIC ECHOCARDIOGRAM  01/21/2009  ? mild LVH,  ef 21-79%, grade 1 diastolic dysfunction  ? ULNAR NERVE TRANSPOSITION Left 12-20-2008  dr sypher  ? decompression and partial resection medial triceps fascia  ? WIDE LOCAL EXCISION PILONIDAL AREA  02-02-2002  [name]  ? recurrent squamous cell carcinoma in situ  ? ?Social History  ? ?Occupational History  ? Occupation: retired Pharmacist, hospital  ?Tobacco Use  ? Smoking status: Former  ?  Packs/day: 0.50  ?  Years: 10.00  ?  Pack years: 5.00  ?  Types: Cigarettes  ?  Quit date: 12/10/1973  ?  Years since quitting: 47.6  ? Smokeless tobacco: Never  ?Vaping Use  ? Vaping Use: Never used  ?Substance and Sexual Activity  ? Alcohol use: Yes  ?  Comment: occasional  ? Drug use: No  ? Sexual activity: Not on file  ? ? ? ? ? ? ?

## 2021-07-24 ENCOUNTER — Encounter: Payer: Self-pay | Admitting: Orthopaedic Surgery

## 2021-07-24 ENCOUNTER — Encounter: Payer: Self-pay | Admitting: Cardiovascular Disease

## 2021-07-24 ENCOUNTER — Ambulatory Visit: Payer: Medicare PPO | Admitting: Cardiovascular Disease

## 2021-07-24 VITALS — BP 144/84 | HR 104 | Ht 66.0 in | Wt 203.0 lb

## 2021-07-24 DIAGNOSIS — I739 Peripheral vascular disease, unspecified: Secondary | ICD-10-CM | POA: Diagnosis not present

## 2021-07-24 DIAGNOSIS — E669 Obesity, unspecified: Secondary | ICD-10-CM | POA: Diagnosis not present

## 2021-07-24 DIAGNOSIS — G4733 Obstructive sleep apnea (adult) (pediatric): Secondary | ICD-10-CM | POA: Diagnosis not present

## 2021-07-24 DIAGNOSIS — I5022 Chronic systolic (congestive) heart failure: Secondary | ICD-10-CM | POA: Diagnosis not present

## 2021-07-24 DIAGNOSIS — E119 Type 2 diabetes mellitus without complications: Secondary | ICD-10-CM

## 2021-07-24 DIAGNOSIS — I10 Essential (primary) hypertension: Secondary | ICD-10-CM | POA: Diagnosis not present

## 2021-07-24 DIAGNOSIS — E785 Hyperlipidemia, unspecified: Secondary | ICD-10-CM | POA: Diagnosis not present

## 2021-07-24 DIAGNOSIS — I5032 Chronic diastolic (congestive) heart failure: Secondary | ICD-10-CM | POA: Diagnosis not present

## 2021-07-24 MED ORDER — FUROSEMIDE 20 MG PO TABS: 60.0000 mg | ORAL_TABLET | Freq: Every day | ORAL | 3 refills | Status: DC

## 2021-07-24 NOTE — Patient Instructions (Signed)
Medication Instructions:  ?No changes ?*If you need a refill on your cardiac medications before your next appointment, please call your pharmacy* ? ? ?Lab Work: ?Your provider would like for you to have the following labs today: BMET ? ?If you have labs (blood work) drawn today and your tests are completely normal, you will receive your results only by: ?MyChart Message (if you have MyChart) OR ?A paper copy in the mail ?If you have any lab test that is abnormal or we need to change your treatment, we will call you to review the results. ? ? ?Testing/Procedures: ?Your physician has requested that you have an echocardiogram. Echocardiography is a painless test that uses sound waves to create images of your heart. It provides your doctor with information about the size and shape of your heart and how well your heart?s chambers and valves are working. You may receive an ultrasound enhancing agent through an IV if needed to better visualize your heart during the echo.This procedure takes approximately one hour. There are no restrictions for this procedure. This will take place at the [address].  ? ?Follow-Up: ?At [facility], you and your health needs are our priority.  As part of our continuing mission to provide you with exceptional heart care, we have created designated Provider Care Teams.  These Care Teams include your primary Cardiologist (physician) and Advanced Practice Providers (APPs -  Physician Assistants and Nurse Practitioners) who all work together to provide you with the care you need, when you need it. ? ?We recommend signing up for the patient portal called "MyChart".  Sign up information is provided on this After Visit Summary.  MyChart is used to connect with patients for Virtual Visits (Telemedicine).  Patients are able to view lab/test results, encounter notes, upcoming appointments, etc.  Non-urgent messages can be sent to your provider as well.   ?To learn more about what  you can do with MyChart, go to [email or web address].   ? ?Your next appointment:   ?3 month(s) ? ?The format for your next appointment:   ?In Person ? ?Provider:   ?Sanda Klein, MD { ? ? ? ?

## 2021-07-24 NOTE — Progress Notes (Signed)
?Cardiology Office Note:   ? ?Date:  07/24/2021  ? ?ID:  Erin Roach, DOB 11/24/1940, MRN [id number] ? ?PCP:  Erin Rail, MD  ?Cardiologist:  Erin Klein, MD  ?Electrophysiologist:  None  ? ?Referring MD: Erin Rail, MD  ? ?Chief Complaint  ?Patient presents with  ? Edema  ? ? ?History of Present Illness:   ? ?Erin Roach is a 81 y.o. female with a hx of PAD (moderate bilateral carotid stenosis), bifascicular block, diabetes mellitus type 2, hypertension, hyperlipidemia, obesity, obstructive sleep apnea, chronic lower extremity edema. ? ?She called a few weeks ago because she had worsening lower extremity edema, the legs felt tight and actually painful.  After increasing the dose of diuretic her symptoms have improved.  She has not been weighing herself on a regular basis.  Reports not adding salt to her food, but not clear that she is truly avoiding high sodium foods.  She has a known history of obstructive sleep apnea but rarely uses CPAP since she is "up and down all night" due to restless leg syndrome.  She sleeps in a recliner.  She has been referred by her pulmonary specialist in [facility] to see Dr. Redmond Roach to discuss an implantable Inspire device. ? ?She has not had a lot of problems with exertional dyspnea and does not have orthopnea or PND.  Has not been aware of palpitations, does not have dizziness frequently, denies syncope.  Has not had any problems with chest pain or intermittent claudication.  As always she has mild resting tachycardia, around 100 bpm today.  She has a history of reactive airway disease and uses inhalers. ? ?Her ECG today shows sinus tachycardia, biatrial enlargement, chronic bifascicular block. ? ?She has good glycemic control, hemoglobin A1c was 6.2% last December.  Her lipid parameters are all fair and her LDL cholesterol at 84 has improved from last year when it was 103. ? ? ?- 2010 normal cardiac catheterization ?- 2019 Myoview in 2019 low risk with no  reversible ischemia, EF 71%, basal mid anterior attenuation artifact ?- September 2020  ? - episode of chest pain, cardiac enzymes and ECG negative. ? - carotid doppler 40 to 59% right ICA stenosis, 1 to 39% left ICA stenosis.    ? - Echo EF>65%, impaired relaxation of the LV, mild LAE, trivial MR ? - LE ABI (December 2020) showed triphasic blood flow bilaterally, no PAD ? ?She has not had any recurrent episodes of chest pain since then.  She takes omeprazole "only as needed".  She has a lot of problems with severe restless leg syndromes, although she uses CPAP for sleep apnea.  She is on multiple medications including gabapentin, amitriptyline, pramipexole, Lyrica.  She only scored 7 points on the Epworth Sleepiness Scale.  She has mild intermittent lower extremity edema on a very low-dose of loop diuretic.   ? ?She has a history of a knee replacement in 2016 and back surgery in 2018.  Some limitations due to musculoskeletal pain.  She exercises regularly (physical therapy type exercises) 15 minutes a day 6 days a week and also walks. ? ?Past Medical History:  ?Diagnosis Date  ? Allergic rhinitis   ? Apnea   ? Arthritis   ? Asthma   ? very mild  ? Back pain   ? Bilateral lower extremity edema   ? Carotid artery disease (Erin Roach)   ? a. Carotid duplex 03/2016 - duplex was stable, 13-24% RICA, 4-01% LICA  ? Complication  of anesthesia   ? post-op delirium  ? Endometrial polyp   ? Essential hypertension   ? GERD (gastroesophageal reflux disease)   ? Heart murmur   ? per pt  ? History of adenomatous polyp of colon   ? 04-24-2016  tubular adenoma  ? History of squamous cell carcinoma excision   ? pilonidal area second excision 02-02-2002  ? HTN (hypertension)   ? Hyperlipidemia   ? Leg pain   ? OAB (overactive bladder)   ? RA (rheumatoid arthritis) (Erin Roach)   ? RBBB (right bundle branch block with left anterior fascicular block)   ? RLS (restless legs syndrome)   ? Shortness of breath on exertion   ? SUI (stress urinary  incontinence, female)   ? Venous insufficiency   ? legs  ? Vitamin D deficiency   ? ? ?Past Surgical History:  ?Procedure Laterality Date  ? APPENDECTOMY  07-02-2005   dr Erin Roach  ? open  ? CARDIAC CATHETERIZATION  01-23-2009  dr Erin Roach  ? normal coronary arteries and lvf  ? CARDIOVASCULAR STRESS TEST  03/31/2016  ? Low risk nuclear study w/ medium defect of mild severity in basal anterior and mid anterior location w/ no evidence ischemia or infarction/  normal LV function and wall motion,  nuclear stress ef 71%  ? CATARACT EXTRACTION W/ INTRAOCULAR LENS IMPLANT Left 09/2016  ? COLONOSCOPY  last one 04-24-2016  ? HYSTEROSCOPY WITH D & C N/A 12/22/2016  ? Procedure: DILATATION AND CURETTAGE /HYSTEROSCOPY;  Surgeon: Erin Queen, MD;  Location: [facility];  Service: Gynecology;  Laterality: N/A;  ? IR US GUIDANCE  09/08/2016  ? POSTERIOR LUMBAR FUSION  02/ [facility] ([place], [place])  ? SKIN CANCER EXCISION    ? TONSILLECTOMY  child  ? TOTAL KNEE ARTHROPLASTY Left 04/09/2015  ? Procedure: LEFT TOTAL KNEE ARTHROPLASTY;  Surgeon: Erin Cancel, MD;  Location: [facility];  Service: Orthopedics;  Laterality: Left;  ? TRANSTHORACIC ECHOCARDIOGRAM  01/21/2009  ? mild LVH, ef 29-47%, grade 1 diastolic dysfunction  ? ULNAR NERVE TRANSPOSITION Left 12-20-2008  dr Erin Roach  ? decompression and partial resection medial triceps fascia  ? WIDE LOCAL EXCISION PILONIDAL AREA  02-02-2002  dr Erin Roach  ? recurrent squamous cell carcinoma in situ  ? ? ?Current Medications: ?Current Meds  ?Medication Sig  ? ACCU-CHEK GUIDE test strip TEST TWICE A DAY  ? Accu-Chek Softclix Lancets lancets Use to help check blood sugars twice a day  ? amitriptyline (ELAVIL) 50 MG tablet Take 1 tablet (50 mg total) by mouth at bedtime.  ? Blood Glucose Monitoring Suppl (ACCU-CHEK GUIDE) w/Device KIT Use to check blood sugars daily  ? budesonide-formoterol (SYMBICORT) 160-4.5 MCG/ACT inhaler Inhale 2 puffs into the lungs  daily.  ? ergocalciferol (VITAMIN D2) 1.25 MG (50000 UT) capsule Take 1 capsule (50,000 Units total) by mouth once a week. Take one tablet by mouth on Mondays  ? fluticasone (FLONASE) 50 MCG/ACT nasal spray Place 1 spray into both nostrils daily as needed for allergies.  ? glucose blood (ACCU-CHEK GUIDE) test strip Use to check blood sugars twice a day  ? HORIZANT 600 MG TBCR Take 1 tablet by mouth at bedtime.   ? hydrALAZINE (APRESOLINE) 100 MG tablet Take 1 tablet (100 mg total) by mouth 3 (three) times daily.  ? HYDROcodone-acetaminophen (NORCO/VICODIN) 5-325 MG tablet Take 1-2 tablets by mouth at bedtime.  ? Lancets (ONETOUCH ULTRASOFT) lancets Use as instructed  ? lidocaine (LIDODERM) 5 %  Place 1 patch onto the skin daily. Remove & Discard patch within 12 hours or as directed by MD  ? losartan (COZAAR) 100 MG tablet TAKE ONE TABLET BY MOUTH DAILY  ? metFORMIN (GLUCOPHAGE) 500 MG tablet TAKE 1 TABLET BY MOUTH TWICE A DAY WITH MEALS  ? pregabalin (LYRICA) 75 MG capsule TAKE ONE CAPSULE BY MOUTH EVERY AFTERNOON AND TAKE TWO CAPSULES BY MOUTH EVERY NIGHT AT BEDTIME  ? rOPINIRole (REQUIP) 0.25 MG tablet Take 1-2 tablets (0.25-0.5 mg total) by mouth at bedtime.  ? simvastatin (ZOCOR) 40 MG tablet TAKE ONE TABLET BY MOUTH EVERY NIGHT AT BEDTIME  ? spironolactone (ALDACTONE) 25 MG tablet Take 1 tablet (25 mg total) by mouth daily.  ? TECHLITE PEN NEEDLES 32G X 4 MM MISC USE TWICE A DAY AS DIRECTED  ? VICTOZA 18 MG/3ML SOPN DIAL AND INJECT SUBCUTANEOUSLY 1.8MG DAILY EVERY MORNING  ? [DISCONTINUED] furosemide (LASIX) 20 MG tablet Take 2 tablets (40 mg total) by mouth daily. May take extra 21m sat 2-25 and Sunday 2-26  ?  ? ?Allergies:   Dust mite extract  ? ?Social History  ? ?Socioeconomic History  ? Marital status: Married  ?  Spouse name: TClare Gandy ? Number of children: 2  ? Years of education: Not on file  ? Highest education level: Not on file  ?Occupational History  ? Occupation: retired tPharmacist, hospital ?Tobacco Use  ?  Smoking status: Former  ?  Packs/day: 0.50  ?  Years: 10.00  ?  Pack years: 5.00  ?  Types: Cigarettes  ?  Quit date: 12/10/1973  ?  Years since quitting: 47.6  ? Smokeless tobacco: Never  ?Vaping Use  ? Vaping Use: Never used

## 2021-07-25 LAB — BASIC METABOLIC PANEL
BUN/Creatinine Ratio: 25 (ref 12–28)
BUN: 28 mg/dL — ABNORMAL HIGH (ref 8–27)
CO2: 18 mmol/L — ABNORMAL LOW (ref 20–29)
Calcium: 9.4 mg/dL (ref 8.7–10.3)
Chloride: 96 mmol/L (ref 96–106)
Creatinine, Ser: 1.11 mg/dL — ABNORMAL HIGH (ref 0.57–1.00)
Glucose: 130 mg/dL — ABNORMAL HIGH (ref 70–99)
Potassium: 4.8 mmol/L (ref 3.5–5.2)
Sodium: 136 mmol/L (ref 134–144)
eGFR: 51 mL/min/{1.73_m2} — ABNORMAL LOW (ref >59)

## 2021-07-28 ENCOUNTER — Other Ambulatory Visit: Payer: Self-pay | Admitting: *Deleted

## 2021-07-28 DIAGNOSIS — E119 Type 2 diabetes mellitus without complications: Secondary | ICD-10-CM

## 2021-07-28 DIAGNOSIS — I5032 Chronic diastolic (congestive) heart failure: Secondary | ICD-10-CM

## 2021-07-28 DIAGNOSIS — I10 Essential (primary) hypertension: Secondary | ICD-10-CM

## 2021-07-29 DIAGNOSIS — I739 Peripheral vascular disease, unspecified: Secondary | ICD-10-CM | POA: Diagnosis not present

## 2021-07-29 DIAGNOSIS — L603 Nail dystrophy: Secondary | ICD-10-CM | POA: Diagnosis not present

## 2021-07-29 DIAGNOSIS — B351 Tinea unguium: Secondary | ICD-10-CM | POA: Diagnosis not present

## 2021-07-30 ENCOUNTER — Other Ambulatory Visit: Payer: Self-pay | Admitting: Physician Assistant

## 2021-07-31 ENCOUNTER — Other Ambulatory Visit: Payer: Self-pay

## 2021-07-31 ENCOUNTER — Encounter: Payer: Self-pay | Admitting: Cardiovascular Disease

## 2021-07-31 DIAGNOSIS — E119 Type 2 diabetes mellitus without complications: Secondary | ICD-10-CM | POA: Diagnosis not present

## 2021-07-31 DIAGNOSIS — I10 Essential (primary) hypertension: Secondary | ICD-10-CM | POA: Diagnosis not present

## 2021-07-31 DIAGNOSIS — I5032 Chronic diastolic (congestive) heart failure: Secondary | ICD-10-CM | POA: Diagnosis not present

## 2021-08-01 ENCOUNTER — Other Ambulatory Visit: Payer: Self-pay | Admitting: Physician Assistant

## 2021-08-01 LAB — BASIC METABOLIC PANEL
BUN/Creatinine Ratio: 25 (ref 12–28)
BUN: 39 mg/dL — ABNORMAL HIGH (ref 8–27)
CO2: 23 mmol/L (ref 20–29)
Calcium: 9.4 mg/dL (ref 8.7–10.3)
Chloride: 98 mmol/L (ref 96–106)
Creatinine, Ser: 1.53 mg/dL — ABNORMAL HIGH (ref 0.57–1.00)
Glucose: 117 mg/dL — ABNORMAL HIGH (ref 70–99)
Potassium: 4.8 mmol/L (ref 3.5–5.2)
Sodium: 137 mmol/L (ref 134–144)
eGFR: 35 mL/min/{1.73_m2} — ABNORMAL LOW (ref >59)

## 2021-08-01 LAB — LACTIC ACID, PLASMA: Lactate, Ven: 15 mg/dL (ref 4.8–25.7)

## 2021-08-01 MED ORDER — EMPAGLIFLOZIN 10 MG PO TABS: 10.0000 mg | ORAL_TABLET | Freq: Every day | ORAL | 3 refills | Status: DC

## 2021-08-01 MED ORDER — FUROSEMIDE 20 MG PO TABS: 20.0000 mg | ORAL_TABLET | Freq: Every day | ORAL | 3 refills | Status: DC

## 2021-08-01 NOTE — Addendum Note (Signed)
Addended by: Ricci Barker on: 08/01/2021 08:42 AM ? ? Modules accepted: Orders ? ?

## 2021-08-01 NOTE — Addendum Note (Signed)
Addended by: Fidel Levy on: 08/01/2021 03:13 PM ? ? Modules accepted: Orders ? ?

## 2021-08-01 NOTE — Telephone Encounter (Signed)
Please start Jardiance 10 mg daily (or Farxiga 10 mg daily if insurance-preffered) and schedule a follow up appointment in about 6 weeks, if not already scheduled. ?

## 2021-08-04 ENCOUNTER — Other Ambulatory Visit: Payer: Self-pay

## 2021-08-04 ENCOUNTER — Encounter: Payer: Self-pay | Admitting: Internal Medicine

## 2021-08-04 ENCOUNTER — Ambulatory Visit (HOSPITAL_COMMUNITY): Payer: Medicare PPO | Attending: Cardiology

## 2021-08-04 ENCOUNTER — Encounter: Payer: Self-pay | Admitting: Cardiovascular Disease

## 2021-08-04 DIAGNOSIS — M545 Low back pain, unspecified: Secondary | ICD-10-CM

## 2021-08-04 DIAGNOSIS — I5032 Chronic diastolic (congestive) heart failure: Secondary | ICD-10-CM | POA: Diagnosis not present

## 2021-08-04 LAB — ECHOCARDIOGRAM COMPLETE
AR max vel: 1.65 cm2
AV Area VTI: 2.09 cm2
AV Area mean vel: 2 cm2
AV Mean grad: 10 mmHg
AV Peak grad: 29.6 mmHg
Ao pk vel: 2.72 m/s
Area-P 1/2: 4.74 cm2
MV VTI: 2.15 cm2
S' Lateral: 2.6 cm

## 2021-08-04 MED ORDER — PERFLUTREN LIPID MICROSPHERE
1.0000 mL | INTRAVENOUS | Status: AC | PRN
  Administered 2021-08-04: 2 mL via INTRAVENOUS

## 2021-08-04 MED ORDER — HYDROCODONE-ACETAMINOPHEN 5-325 MG PO TABS: 1.0000 | ORAL_TABLET | Freq: Every day | ORAL | 0 refills | Status: DC

## 2021-08-05 ENCOUNTER — Encounter: Payer: Self-pay | Admitting: Cardiovascular Disease

## 2021-08-05 NOTE — Telephone Encounter (Signed)
Stop lasix - creatinine is worse. Heart looks like she is dehydrated. Ok to continue spironolactone. Repeat BMET in 1 week - follow-up with Dr. Loletha Grayer ? ?Dr Lemmie Evens ?

## 2021-08-06 ENCOUNTER — Ambulatory Visit: Payer: Medicare PPO | Admitting: Orthopaedic Surgery

## 2021-08-07 DIAGNOSIS — G2581 Restless legs syndrome: Secondary | ICD-10-CM | POA: Diagnosis not present

## 2021-08-07 DIAGNOSIS — G4733 Obstructive sleep apnea (adult) (pediatric): Secondary | ICD-10-CM | POA: Diagnosis not present

## 2021-08-09 ENCOUNTER — Other Ambulatory Visit: Payer: Self-pay | Admitting: Internal Medicine

## 2021-08-11 ENCOUNTER — Encounter: Payer: Self-pay | Admitting: Internal Medicine

## 2021-08-11 ENCOUNTER — Encounter: Payer: Self-pay | Admitting: Cardiovascular Disease

## 2021-08-11 ENCOUNTER — Ambulatory Visit: Payer: Medicare PPO | Admitting: Cardiovascular Disease

## 2021-08-11 VITALS — BP 137/70 | HR 96 | Ht 66.0 in | Wt 202.2 lb

## 2021-08-11 DIAGNOSIS — I739 Peripheral vascular disease, unspecified: Secondary | ICD-10-CM

## 2021-08-11 DIAGNOSIS — I10 Essential (primary) hypertension: Secondary | ICD-10-CM | POA: Diagnosis not present

## 2021-08-11 DIAGNOSIS — E119 Type 2 diabetes mellitus without complications: Secondary | ICD-10-CM | POA: Diagnosis not present

## 2021-08-11 DIAGNOSIS — I5032 Chronic diastolic (congestive) heart failure: Secondary | ICD-10-CM

## 2021-08-11 DIAGNOSIS — E785 Hyperlipidemia, unspecified: Secondary | ICD-10-CM

## 2021-08-11 DIAGNOSIS — G4733 Obstructive sleep apnea (adult) (pediatric): Secondary | ICD-10-CM | POA: Diagnosis not present

## 2021-08-11 NOTE — Patient Instructions (Signed)
Medication Instructions:  ?RESTART Furosemide 20 mg once daily ? ?*If you need a refill on your cardiac medications before your next appointment, please call your pharmacy* ? ? ?Lab Work: ?Your provider would like for you to return in 2 weeks to have the following labs drawn: BNP and BMET. You do not need an appointment for the lab. Once in our office lobby there is a podium where you can sign in and ring the doorbell to alert [name] that you are here. The lab is open from 8:00 am to 4 pm; closed for lunch from 12:45pm-1:45pm. ? ?You may also go to any of these [facility] locations: ?  ?[place] ?- [place] ([facility]) ?- [address] ?- [place]  ?[place] ?- [address]  ?  ?[place]  ?- [address]  ?  ?[place] ?- [place]  ?  ?- [place] ?- [place] [address] ([facility] ? ?If you have labs (blood work) drawn today and your tests are completely normal, you will receive your results only by: ?MyChart Message (if you have MyChart) OR ?A paper copy in the mail ?If you have any lab test that is abnormal or we need to change your treatment, we will call you to review the results. ? ? ?Testing/Procedures: ?None ordered ? ? ?Follow-Up: ?At [facility], you and your health needs are our priority.  As part of our continuing mission to provide you with exceptional heart care, we have created designated Provider Care Teams.  These Care Teams include your primary Cardiologist (physician) and Advanced Practice Providers (APPs -  Physician Assistants and Nurse Practitioners) who all work together to provide you with the care you need, when you need it. ? ?We recommend signing up for the patient portal called "MyChart".  Sign up information is provided on this After Visit Summary.  MyChart is used to connect with patients for Virtual Visits  (Telemedicine).  Patients are able to view lab/test results, encounter notes, upcoming appointments, etc.  Non-urgent messages can be sent to your provider as well.   ?To learn more about what you can do with MyChart, go to [email or web address].   ? ?Your next appointment:   ?Keep your follow as scheduled.  ? ?

## 2021-08-11 NOTE — Progress Notes (Signed)
?Cardiology Office Note:   ? ?Date:  08/13/2021  ? ?ID:  Calvin K Threat, DOB 04/15/1941, MRN [id number] ? ?PCP:  Burns, Stacy J, MD  ?Cardiologist:  Mihai Croitoru, MD  ?Electrophysiologist:  None  ? ?Referring MD: Burns, Stacy J, MD  ? ?Chief Complaint  ?Patient presents with  ? Shortness of Breath  ? ? ?History of Present Illness:   ? ?Erin Roach is a 80 y.o. female with a hx of PAD (moderate bilateral carotid stenosis), bifascicular block, diabetes mellitus type 2, hypertension, hyperlipidemia, obesity, obstructive sleep apnea, chronic lower extremity edema. ? ?After starting treatment with diuretics lower extremity edema improved, she did have some problems with transient dizziness.  She has been logging her weights daily, as well as her blood pressure.  Her weight dropped fairly abruptly from a maximum of 205 pounds to 197 pounds in the course of about 48 hours.  Her blood pressure was never recorded as low.  Her renal function parameters worsened (creatinine 1-1.5) and her echocardiogram showed hyperdynamic left ventricular systolic function with an ejection fraction greater than 70%.  She was advised to stop diuretics.  A few days later she woke up in the middle the night short of breath and had to sit up for a couple of hours before she can fall asleep again.  Last night towards the morning she had some problems with shortness of breath when lying flat.  This appeared to occur when her weight exceeded 202 pounds. ? ?She has untreated sleep apnea.  Is not yet using CPAP.  Looking to see an ENT to discuss an implantable Inspire device. ? ?He does not have chest pain.  Denies palpitations, syncope or focal neurological events.  As always she has mild resting sinus tachycardia.   ? ?Her ECG at her last appointment shows sinus tachycardia, biatrial enlargement, chronic bifascicular block. ? ?I reviewed her echocardiogram images.  I agree that it they show a hyperdynamic left ventricle with EF greater than 70%.   There is mild LVH.  The left atrium is moderately dilated.  In my opinion there is no left ventricular outflow tract obstruction, the "gradient" is exclusively mid cavitary due to hyperdynamic function.  There is no mitral regurgitation.  Mitral inflow pattern is consistent with impaired relaxation.  E/e' is increased (12.9-15.6), consistent with increased mean left atrial pressure despite the hyperdynamic left ventricular function. ? ?She has good glycemic control, hemoglobin A1c was 6.2% last December.  Her lipid parameters are all fair and her LDL cholesterol at 84 has improved from last year when it was 103. ? ? ?- 2010 normal cardiac catheterization ?- 2019 Myoview in 2019 low risk with no reversible ischemia, EF 71%, basal mid anterior attenuation artifact ?- September 2020  ? - episode of chest pain, cardiac enzymes and ECG negative. ? - carotid doppler 40 to 59% right ICA stenosis, 1 to 39% left ICA stenosis.    ? - Echo EF>65%, impaired relaxation of the LV, mild LAE, trivial MR ? - LE ABI (December 2020) showed triphasic blood flow bilaterally, no PAD ? ?She has not had any recurrent episodes of chest pain since then.  She takes omeprazole "only as needed".  She has a lot of problems with severe restless leg syndromes, although she uses CPAP for sleep apnea.  She is on multiple medications including gabapentin, amitriptyline, pramipexole, Lyrica.  She only scored 7 points on the Epworth Sleepiness Scale.  She has mild intermittent lower extremity edema on a   very low-dose of loop diuretic.   ? ?She has a history of a knee replacement in 2016 and back surgery in 2018.  Some limitations due to musculoskeletal pain.  She exercises regularly (physical therapy type exercises) 15 minutes a day 6 days a week and also walks. ? ?Past Medical History:  ?Diagnosis Date  ? Allergic rhinitis   ? Apnea   ? Arthritis   ? Asthma   ? very mild  ? Back pain   ? Bilateral lower extremity edema   ? Carotid artery disease  (HCC)   ? a. Carotid duplex 03/2016 - duplex was stable, 40-59% RICA, 1-39% LICA  ? Complication of anesthesia   ? post-op delirium  ? Endometrial polyp   ? Essential hypertension   ? GERD (gastroesophageal reflux disease)   ? Heart murmur   ? per pt  ? History of adenomatous polyp of colon   ? 04-24-2016  tubular adenoma  ? History of squamous cell carcinoma excision   ? pilonidal area second excision 02-02-2002  ? HTN (hypertension)   ? Hyperlipidemia   ? Leg pain   ? OAB (overactive bladder)   ? RA (rheumatoid arthritis) (HCC)   ? RBBB (right bundle branch block with left anterior fascicular block)   ? RLS (restless legs syndrome)   ? Shortness of breath on exertion   ? SUI (stress urinary incontinence, female)   ? Venous insufficiency   ? legs  ? Vitamin D deficiency   ? ? ?Past Surgical History:  ?Procedure Laterality Date  ? APPENDECTOMY  07-02-2005   dr weatherly  ? open  ? CARDIAC CATHETERIZATION  01-23-2009  dr bruce brodie  ? normal coronary arteries and lvf  ? CARDIOVASCULAR STRESS TEST  03/31/2016  ? Low risk nuclear study w/ medium defect of mild severity in basal anterior and mid anterior location w/ no evidence ischemia or infarction/  normal LV function and wall motion,  nuclear stress ef 71%  ? CATARACT EXTRACTION W/ INTRAOCULAR LENS IMPLANT Left 09/2016  ? COLONOSCOPY  last one 04-24-2016  ? HYSTEROSCOPY WITH D & C N/A 12/22/2016  ? Procedure: DILATATION AND CURETTAGE /HYSTEROSCOPY;  Surgeon: Grewal, Michelle, MD;  Location: [facility];  Service: Gynecology;  Laterality: N/A;  ? IR US GUIDANCE  09/08/2016  ? POSTERIOR LUMBAR FUSION  02/ 2018    [facility] ([place], [place])  ? SKIN CANCER EXCISION    ? TONSILLECTOMY  child  ? TOTAL KNEE ARTHROPLASTY Left 04/09/2015  ? Procedure: LEFT TOTAL KNEE ARTHROPLASTY;  Surgeon: [name] Olin, MD;  Location: [facility];  Service: Orthopedics;  Laterality: Left;  ? TRANSTHORACIC ECHOCARDIOGRAM  01/21/2009  ? mild LVH, ef 75-80%, grade 1 diastolic  dysfunction  ? ULNAR NERVE TRANSPOSITION Left 12-20-2008  dr sypher  ? decompression and partial resection medial triceps fascia  ? WIDE LOCAL EXCISION PILONIDAL AREA  02-02-2002  dr weatherly  ? recurrent squamous cell carcinoma in situ  ? ? ?Current Medications: ?Current Meds  ?Medication Sig  ? amitriptyline (ELAVIL) 50 MG tablet Take 1 tablet (50 mg total) by mouth at bedtime.  ? Blood Glucose Monitoring Suppl (ACCU-CHEK GUIDE) w/Device KIT Use to check blood sugars daily  ? empagliflozin (JARDIANCE) 10 MG TABS tablet Take 1 tablet (10 mg total) by mouth daily before breakfast.  ? ergocalciferol (VITAMIN D2) 1.25 MG (50000 UT) capsule Take 1 capsule (50,000 Units total) by mouth once a week. Take one tablet by mouth on Mondays  ? ezetimibe (  ZETIA) 10 MG tablet Take 1 tablet (10 mg total) by mouth daily.  ? furosemide (LASIX) 20 MG tablet Take 1 tablet (20 mg total) by mouth daily.  ? HORIZANT 600 MG TBCR Take 1 tablet by mouth at bedtime.   ? hydrALAZINE (APRESOLINE) 100 MG tablet Take 1 tablet (100 mg total) by mouth 3 (three) times daily. Keep scheduled appointment for further refills, 1st attempt  ? HYDROcodone-acetaminophen (NORCO/VICODIN) 5-325 MG tablet Take 1-2 tablets by mouth at bedtime.  ? pregabalin (LYRICA) 75 MG capsule TAKE ONE CAPSULE BY MOUTH EVERY AFTERNOON AND TAKE TWO CAPSULES BY MOUTH EVERY NIGHT AT BEDTIME  ? rOPINIRole (REQUIP) 0.25 MG tablet Take 1-2 tablets (0.25-0.5 mg total) by mouth at bedtime.  ? rosuvastatin (CRESTOR) 20 MG tablet Take 1 tablet (20 mg total) by mouth daily.  ? simvastatin (ZOCOR) 40 MG tablet TAKE ONE TABLET BY MOUTH EVERY NIGHT AT BEDTIME  ? spironolactone (ALDACTONE) 25 MG tablet Take 1 tablet (25 mg total) by mouth daily.  ? TECHLITE PEN NEEDLES 32G X 4 MM MISC USE TWICE A DAY AS DIRECTED  ? VICTOZA 18 MG/3ML SOPN DIAL AND INJECT SUBCUTANEOUSLY 1.8MG DAILY EVERY MORNING  ? [DISCONTINUED] losartan (COZAAR) 100 MG tablet TAKE ONE TABLET BY MOUTH DAILY  ?   ? ?Allergies:   Dust mite extract  ? ?Social History  ? ?Socioeconomic History  ? Marital status: Married  ?  Spouse name: Clare Gandy  ? Number of children: 2  ? Years of education: Not on file  ? Highest education level: N

## 2021-08-12 ENCOUNTER — Other Ambulatory Visit: Payer: Self-pay | Admitting: Internal Medicine

## 2021-08-13 ENCOUNTER — Encounter: Payer: Self-pay | Admitting: Cardiovascular Disease

## 2021-08-14 ENCOUNTER — Ambulatory Visit: Payer: Medicare PPO | Admitting: Physician Assistant

## 2021-08-20 ENCOUNTER — Encounter: Payer: Self-pay | Admitting: Orthopaedic Surgery

## 2021-08-20 ENCOUNTER — Ambulatory Visit: Payer: Medicare PPO | Admitting: Orthopaedic Surgery

## 2021-08-20 DIAGNOSIS — G8929 Other chronic pain: Secondary | ICD-10-CM

## 2021-08-20 DIAGNOSIS — M1711 Unilateral primary osteoarthritis, right knee: Secondary | ICD-10-CM | POA: Diagnosis not present

## 2021-08-20 MED ORDER — HYALURONAN 30 MG/2ML IX SOSY
30.0000 mg | PREFILLED_SYRINGE | INTRA_ARTICULAR | Status: AC | PRN
  Administered 2021-08-20: 30 mg via INTRA_ARTICULAR

## 2021-08-20 NOTE — Progress Notes (Signed)
? ?Office Visit Note ?  ?Patient: Erin Roach           ?Date of Birth: 1940/12/08           ?MRN: [id number] ?Visit Date: 08/20/2021 ?             ?Requested by: Binnie Rail, MD ?[place],  [place] [address] ?PCP: Binnie Rail, MD ? ? ?Assessment & Plan: ?Visit Diagnoses:  ?1. Chronic pain of right knee   ? ? ?Plan: First Orthovisc injection right knee.  Return weekly for the next 2 weeks to complete the series of 3 ? ?Follow-Up Instructions: Return in about 1 week (around 08/27/2021).  ? ?Orders:  ?No orders of the defined types were placed in this encounter. ? ?No orders of the defined types were placed in this encounter. ? ? ? ? Procedures: ?Large Joint Inj: R knee on 08/20/2021 2:41 PM ?Indications: pain and joint swelling ?Details: 25 G 1.5 in needle ? ?Arthrogram: No ? ?Medications: 30 mg Hyaluronan 30 MG/2ML ?Outcome: tolerated well, no immediate complications ?Procedure, treatment alternatives, risks and benefits explained, specific risks discussed. Consent was given by the patient. Immediately prior to procedure a time out was called to verify the correct patient, procedure, equipment, support staff and site/side marked as required. Patient was prepped and draped in the usual sterile fashion.  ? ? ? ? ?Clinical Data: ?No additional findings. ? ? ?Subjective: ?Chief Complaint  ?Patient presents with  ? Right Knee - Follow-up  ?  Orthovisc #1  ?Patient presents today for the first Orthovisc injection into her right knee.  ? ?HPI ? ?Review of Systems ? ? ?Objective: ?Vital Signs: There were no vitals taken for this visit. ? ?Physical Exam ? ?Ortho Exam right knee was not hot red warm or swollen.  Some medial joint pain.  Possibly is very small effusion.  Full extension flexed about 100 degrees without instability ? ?Specialty Comments:  ?No specialty comments available. ? ?Imaging: ?No results found. ? ? ?PMFS History: ?Patient Active Problem List  ? Diagnosis Date Noted  ? Bilateral  primary osteoarthritis of knee 07/23/2021  ? Episodic lightheadedness 04/30/2021  ? Pain in right knee 12/12/2020  ? Anemia 04/09/2020  ? Carotid arterial disease ([place]) 01/23/2019  ? RBBB (right bundle branch block with left anterior fascicular block)   ? Chest pain 01/22/2019  ? Weakness of both lower extremities 01/02/2019  ? Frequent falls 01/02/2019  ? OSA on CPAP 06/15/2017  ? Diabetes ([place]) 03/02/2017  ? Herpes zoster without complication 83/66/2947  ? B12 nutritional deficiency 02/23/2017  ? Vitamin D deficiency 12/08/2016  ? Tubular adenoma of colon 04/29/2016  ? Essential hypertension 09/12/2015  ? Allergic rhinitis 07/17/2015  ? Insomnia 07/17/2015  ? Obese 04/10/2015  ? S/P left TKA 04/09/2015  ? Osteopenia 12/04/2012  ? 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 RESTLESS LEG SYNDROME 02/11/2009  ? LEG EDEMA, BILATERAL 02/11/2009  ? Hyperlipidemia 10/30/2008  ? Urinary incontinence 10/30/2008  ? Skin cancer 10/30/2008  ? Asthma 11/21/2007  ? Venous (peripheral) insufficiency 11/22/2006  ? ?Past Medical History:  ?Diagnosis Date  ? Allergic rhinitis   ? Apnea   ? Arthritis   ? Asthma   ? very mild  ? Back pain   ? Bilateral lower extremity edema   ? Carotid artery disease ([place])   ? a. Carotid duplex 03/2016 - duplex was stable, 65-46% RICA, 5-03% LICA  ? Complication of anesthesia   ? post-op  delirium  ? Endometrial polyp   ? Essential hypertension   ? GERD (gastroesophageal reflux disease)   ? Heart murmur   ? per pt  ? History of adenomatous polyp of colon   ? 04-24-2016  tubular adenoma  ? History of squamous cell carcinoma excision   ? pilonidal area second excision 02-02-2002  ? HTN (hypertension)   ? Hyperlipidemia   ? Leg pain   ? OAB (overactive bladder)   ? RA (rheumatoid arthritis) ([place])   ? RBBB (right bundle branch block with left anterior fascicular block)   ? RLS (restless legs syndrome)   ? Shortness of breath on exertion   ? SUI (stress urinary incontinence, female)   ? Venous insufficiency   ? legs  ?  Vitamin D deficiency   ?  ?Family History  ?Problem Relation Age of Onset  ? Esophageal cancer Father   ? Heart disease Father   ?     Unknown what kind  ? Hyperlipidemia Father   ? Hypertension Father   ? Cancer Father   ? Heart failure Father   ? Hyperlipidemia Mother   ? Hypertension Mother   ? CAD Sister   ?     10 years younger than patient - had stent. Was told that her anorexia/bulimia may have played a role  ? Stroke Maternal Grandmother 14  ? Diabetes Maternal Grandmother   ? Heart attack Maternal Grandfather 93  ? COPD Maternal Grandfather   ? Heart attack Paternal Grandmother   ?     67s  ? Diabetes Other   ?     MGGM  ?  ?Past Surgical History:  ?Procedure Laterality Date  ? APPENDECTOMY  07-02-2005   [name]  ? open  ? CARDIAC CATHETERIZATION  01-23-2009  dr Darnell Level brodie  ? normal coronary arteries and lvf  ? CARDIOVASCULAR STRESS TEST  03/31/2016  ? Low risk nuclear study w/ medium defect of mild severity in basal anterior and mid anterior location w/ no evidence ischemia or infarction/  normal LV function and wall motion,  nuclear stress ef 71%  ? CATARACT EXTRACTION W/ INTRAOCULAR LENS IMPLANT Left 09/2016  ? COLONOSCOPY  last one 04-24-2016  ? HYSTEROSCOPY WITH D & C N/A 12/22/2016  ? Procedure: DILATATION AND CURETTAGE /HYSTEROSCOPY;  Surgeon: Dian Queen, MD;  Location: [facility];  Service: Gynecology;  Laterality: N/A;  ? IR US GUIDANCE  09/08/2016  ? POSTERIOR LUMBAR FUSION  02/ [facility] ([place], [place])  ? SKIN CANCER EXCISION    ? TONSILLECTOMY  child  ? TOTAL KNEE ARTHROPLASTY Left 04/09/2015  ? Procedure: LEFT TOTAL KNEE ARTHROPLASTY;  Surgeon: Paralee Cancel, MD;  Location: [facility];  Service: Orthopedics;  Laterality: Left;  ? TRANSTHORACIC ECHOCARDIOGRAM  01/21/2009  ? mild LVH, ef 81-01%, grade 1 diastolic dysfunction  ? ULNAR NERVE TRANSPOSITION Left 12-20-2008  dr sypher  ? decompression and partial resection medial triceps fascia  ? WIDE LOCAL  EXCISION PILONIDAL AREA  02-02-2002  [name]  ? recurrent squamous cell carcinoma in situ  ? ?Social History  ? ?Occupational History  ? Occupation: retired Pharmacist, hospital  ?Tobacco Use  ? Smoking status: Former  ?  Packs/day: 0.50  ?  Years: 10.00  ?  Pack years: 5.00  ?  Types: Cigarettes  ?  Quit date: 12/10/1973  ?  Years since quitting: 47.7  ? Smokeless tobacco: Never  ?Vaping Use  ? Vaping Use: Never used  ?  Substance and Sexual Activity  ? Alcohol use: Yes  ?  Comment: occasional  ? Drug use: No  ? Sexual activity: Not on file  ? ? ? ? ? ? ?

## 2021-08-25 DIAGNOSIS — I10 Essential (primary) hypertension: Secondary | ICD-10-CM | POA: Diagnosis not present

## 2021-08-25 DIAGNOSIS — I5032 Chronic diastolic (congestive) heart failure: Secondary | ICD-10-CM | POA: Diagnosis not present

## 2021-08-25 LAB — BASIC METABOLIC PANEL
BUN/Creatinine Ratio: 20 (ref 12–28)
BUN: 24 mg/dL (ref 8–27)
CO2: 18 mmol/L — ABNORMAL LOW (ref 20–29)
Calcium: 8.8 mg/dL (ref 8.7–10.3)
Chloride: 105 mmol/L (ref 96–106)
Creatinine, Ser: 1.19 mg/dL — ABNORMAL HIGH (ref 0.57–1.00)
Glucose: 85 mg/dL (ref 70–99)
Potassium: 5 mmol/L (ref 3.5–5.2)
Sodium: 140 mmol/L (ref 134–144)
eGFR: 47 mL/min/{1.73_m2} — ABNORMAL LOW (ref >59)

## 2021-08-26 LAB — BRAIN NATRIURETIC PEPTIDE: BNP: 30.7 pg/mL (ref 0.0–100.0)

## 2021-08-28 ENCOUNTER — Encounter: Payer: Self-pay | Admitting: Cardiovascular Disease

## 2021-08-28 ENCOUNTER — Encounter: Payer: Self-pay | Admitting: Orthopaedic Surgery

## 2021-08-28 ENCOUNTER — Ambulatory Visit: Payer: Medicare PPO | Admitting: Orthopaedic Surgery

## 2021-08-28 DIAGNOSIS — M25561 Pain in right knee: Secondary | ICD-10-CM | POA: Diagnosis not present

## 2021-08-28 DIAGNOSIS — G8929 Other chronic pain: Secondary | ICD-10-CM

## 2021-08-28 MED ORDER — LIDOCAINE HCL 1 % IJ SOLN
2.0000 mL | INTRAMUSCULAR | Status: AC | PRN
  Administered 2021-08-28: 2 mL

## 2021-08-28 MED ORDER — HYALURONAN 30 MG/2ML IX SOSY
30.0000 mg | PREFILLED_SYRINGE | INTRA_ARTICULAR | Status: AC | PRN
  Administered 2021-08-28: 30 mg via INTRA_ARTICULAR

## 2021-08-28 NOTE — Progress Notes (Signed)
? ?Office Visit Note ?  ?Patient: Erin Roach           ?Date of Birth: [birth year]           ?MRN: [id number] ?Visit Date: 08/28/2021 ?             ?Requested by: Binnie Rail, MD ?[place],  [place] [address] ?PCP: Binnie Rail, MD ? ? ?Assessment & Plan: ?Visit Diagnoses: No diagnosis found. ? ?Plan: Pleasant 81 year old woman who presents today for her second Orthovisc injection into her right knee.  She did have pain after the injection at her last visit but no erythema no cellulitis.  Went forward with an injection today but did inject 2 cc of lidocaine prior.  She felt she tolerated the injection much better.  Follow-up in 1 week for her final injection ? ?Follow-Up Instructions: No follow-ups on file.  ? ?Orders:  ?No orders of the defined types were placed in this encounter. ? ?No orders of the defined types were placed in this encounter. ? ? ? ? Procedures: ?Large Joint Inj: R knee on 08/28/2021 2:46 PM ?Indications: pain and diagnostic evaluation ?Details: 25 G 1.5 in needle, anterolateral approach ? ?Arthrogram: No ? ?Medications: 2 mL lidocaine 1 %; 30 mg Hyaluronan 30 MG/2ML ?Outcome: tolerated well, no immediate complications ? ?After obtaining verbal consent the anterolateral aspect of her knee was prepped with alcohol and Betadine.  2 cc of lidocaine plain was injected.  This was followed by injection of the Orthovisc.  She tolerated the procedure well ?Procedure, treatment alternatives, risks and benefits explained, specific risks discussed. Consent was given by the patient. Immediately prior to procedure a time out was called to verify the correct patient, procedure, equipment, support staff and site/side marked as required. Patient was prepped and draped in the usual sterile fashion.  ? ? ? ?Clinical Data: ?No additional findings. ? ? ?Subjective: ?Chief Complaint  ?Patient presents with  ?? Right Knee - Follow-up  ?  Orthovisc  ?Patient presents today for the second Orthovisc  injection in her right knee.  ? ?HPI ? ?Review of Systems  ?All other systems reviewed and are negative. ? ? ?Objective: ?Vital Signs: There were no vitals taken for this visit. ? ?Physical Exam ?Constitutional:   ?   Appearance: Normal appearance.  ?Pulmonary:  ?   Effort: Pulmonary effort is normal.  ?Neurological:  ?   Mental Status: She is alert.  ? ? ?Ortho Exam ?Examination of her knee shows no effusion no swelling. ?Specialty Comments:  ?No specialty comments available. ? ?Imaging: ?No results found. ? ? ?PMFS History: ?Patient Active Problem List  ? Diagnosis Date Noted  ?? Bilateral primary osteoarthritis of knee 07/23/2021  ?? Episodic lightheadedness 04/30/2021  ?? Pain in right knee 12/12/2020  ?? Anemia 04/09/2020  ?? Carotid arterial disease ([place]) 01/23/2019  ?? RBBB (right bundle branch block with left anterior fascicular block)   ?? Chest pain 01/22/2019  ?? Weakness of both lower extremities 01/02/2019  ?? Frequent falls 01/02/2019  ?? OSA on CPAP 06/15/2017  ?? Diabetes ([place]) 03/02/2017  ?? Herpes zoster without complication 05/13/7251  ?? B12 nutritional deficiency 02/23/2017  ?? Vitamin D deficiency 12/08/2016  ?? Tubular adenoma of colon 04/29/2016  ?? Essential hypertension 09/12/2015  ?? Allergic rhinitis 07/17/2015  ?? Insomnia 07/17/2015  ?? Obese 04/10/2015  ?? S/P left TKA 04/09/2015  ?? Osteopenia 12/04/2012  ?? FAMILIAL TREMOR 11/18/2009  ?? RESTLESS LEG SYNDROME 02/11/2009  ??  LEG EDEMA, BILATERAL 02/11/2009  ?? Hyperlipidemia 10/30/2008  ?? Urinary incontinence 10/30/2008  ?? Skin cancer 10/30/2008  ?? Asthma 11/21/2007  ?? Venous (peripheral) insufficiency 11/22/2006  ? ?Past Medical History:  ?Diagnosis Date  ?? Allergic rhinitis   ?? Apnea   ?? Arthritis   ?? Asthma   ? very mild  ?? Back pain   ?? Bilateral lower extremity edema   ?? Carotid artery disease ([place])   ? a. Carotid duplex 03/2016 - duplex was stable, 16-10% RICA, 9-60% LICA  ?? Complication of anesthesia   ? post-op  delirium  ?? Endometrial polyp   ?? Essential hypertension   ?? GERD (gastroesophageal reflux disease)   ?? Heart murmur   ? per pt  ?? History of adenomatous polyp of colon   ? 04-24-2016  tubular adenoma  ?? History of squamous cell carcinoma excision   ? pilonidal area second excision 02-02-2002  ?? HTN (hypertension)   ?? Hyperlipidemia   ?? Leg pain   ?? OAB (overactive bladder)   ?? RA (rheumatoid arthritis) ([place])   ?? RBBB (right bundle branch block with left anterior fascicular block)   ?? RLS (restless legs syndrome)   ?? Shortness of breath on exertion   ?? SUI (stress urinary incontinence, female)   ?? Venous insufficiency   ? legs  ?? Vitamin D deficiency   ?  ?Family History  ?Problem Relation Age of Onset  ?? Esophageal cancer Father   ?? Heart disease Father   ?     Unknown what kind  ?? Hyperlipidemia Father   ?? Hypertension Father   ?? Cancer Father   ?? Heart failure Father   ?? Hyperlipidemia Mother   ?? Hypertension Mother   ?? CAD Sister   ?     10 years younger than patient - had stent. Was told that her anorexia/bulimia may have played a role  ?? Stroke Maternal Grandmother 43  ?? Diabetes Maternal Grandmother   ?? Heart attack Maternal Grandfather 93  ?? COPD Maternal Grandfather   ?? Heart attack Paternal Grandmother   ?     19s  ?? Diabetes Other   ?     MGGM  ?  ?Past Surgical History:  ?Procedure Laterality Date  ?? APPENDECTOMY  07-02-2005   [name]  ? open  ?? CARDIAC CATHETERIZATION  01-23-2009  dr Darnell Level brodie  ? normal coronary arteries and lvf  ?? CARDIOVASCULAR STRESS TEST  03/31/2016  ? Low risk nuclear study w/ medium defect of mild severity in basal anterior and mid anterior location w/ no evidence ischemia or infarction/  normal LV function and wall motion,  nuclear stress ef 71%  ?? CATARACT EXTRACTION W/ INTRAOCULAR LENS IMPLANT Left 09/2016  ?? COLONOSCOPY  last one 04-24-2016  ?? HYSTEROSCOPY WITH D & C N/A 12/22/2016  ? Procedure: DILATATION AND CURETTAGE  /HYSTEROSCOPY;  Surgeon: Dian Queen, MD;  Location: [facility];  Service: Gynecology;  Laterality: N/A;  ?? IR US GUIDANCE  09/08/2016  ?? POSTERIOR LUMBAR FUSION  02/ [facility] ([place], [place])  ?? SKIN CANCER EXCISION    ?? TONSILLECTOMY  child  ?? TOTAL KNEE ARTHROPLASTY Left 04/09/2015  ? Procedure: LEFT TOTAL KNEE ARTHROPLASTY;  Surgeon: Paralee Cancel, MD;  Location: [facility];  Service: Orthopedics;  Laterality: Left;  ?? TRANSTHORACIC ECHOCARDIOGRAM  01/21/2009  ? mild LVH, ef 45-40%, grade 1 diastolic dysfunction  ?? ULNAR NERVE TRANSPOSITION Left 12-20-2008  dr  sypher  ? decompression and partial resection medial triceps fascia  ?? WIDE LOCAL EXCISION PILONIDAL AREA  02-02-2002  [name]  ? recurrent squamous cell carcinoma in situ  ? ?Social History  ? ?Occupational History  ?? Occupation: retired Pharmacist, hospital  ?Tobacco Use  ?? Smoking status: Former  ?  Packs/day: 0.50  ?  Years: 10.00  ?  Pack years: 5.00  ?  Types: Cigarettes  ?  Quit date: 12/10/1973  ?  Years since quitting: 47.7  ?? Smokeless tobacco: Never  ?Vaping Use  ?? Vaping Use: Never used  ?Substance and Sexual Activity  ?? Alcohol use: Yes  ?  Comment: occasional  ?? Drug use: No  ?? Sexual activity: Not on file  ? ? ? ? ? ? ?

## 2021-08-28 NOTE — Telephone Encounter (Signed)
No, the medication that we need to stop is the [place], unfortunately. ?It is again causing a blood chemistry abnormality we call metabolic acidosis.  ?Continue the furosemide 20 mg daily (we may have to increase this in the future due to stopping Jardiance). ?Continue the metformin as prescribed. ?

## 2021-09-01 NOTE — Telephone Encounter (Signed)
Please Rx metformin 500 mg twice daily with a year of refills ?

## 2021-09-02 MED ORDER — METFORMIN HCL 500 MG PO TABS: 500.0000 mg | ORAL_TABLET | Freq: Two times a day (BID) | ORAL | 3 refills | Status: DC

## 2021-09-04 ENCOUNTER — Other Ambulatory Visit: Payer: Self-pay | Admitting: Internal Medicine

## 2021-09-04 ENCOUNTER — Encounter: Payer: Self-pay | Admitting: Orthopaedic Surgery

## 2021-09-04 ENCOUNTER — Ambulatory Visit: Payer: Medicare PPO | Admitting: Orthopaedic Surgery

## 2021-09-04 ENCOUNTER — Encounter: Payer: Self-pay | Admitting: Internal Medicine

## 2021-09-04 DIAGNOSIS — M1711 Unilateral primary osteoarthritis, right knee: Secondary | ICD-10-CM | POA: Diagnosis not present

## 2021-09-04 DIAGNOSIS — M170 Bilateral primary osteoarthritis of knee: Secondary | ICD-10-CM

## 2021-09-04 MED ORDER — HYALURONAN 30 MG/2ML IX SOSY
30.0000 mg | PREFILLED_SYRINGE | INTRA_ARTICULAR | Status: AC | PRN
  Administered 2021-09-04: 30 mg via INTRA_ARTICULAR

## 2021-09-04 MED ORDER — LIDOCAINE HCL 1 % IJ SOLN
2.0000 mL | INTRAMUSCULAR | Status: AC | PRN
  Administered 2021-09-04: 2 mL

## 2021-09-04 NOTE — Progress Notes (Signed)
? ?Office Visit Note ?  ?Patient: Erin Roach           ?Date of Birth: 04-28-1941           ?MRN: [id number] ?Visit Date: 09/04/2021 ?             ?Requested by: Binnie Rail, MD ?[place],  [place] [address] ?PCP: Binnie Rail, MD ? ? ?Assessment & Plan: ?Visit Diagnoses:  ?1. Bilateral primary osteoarthritis of knee   ? ? ?Plan: Third and final Orthovisc injection right knee. has had excellent response to the prior 2 injections with minimal discomfort.  We will plan to see back as needed ? ?Follow-Up Instructions: Return if symptoms worsen or fail to improve.  ? ?Orders:  ?No orders of the defined types were placed in this encounter. ? ?No orders of the defined types were placed in this encounter. ? ? ? ? Procedures: ?Large Joint Inj: R knee on 09/04/2021 2:28 PM ?Indications: pain and joint swelling ?Details: 25 G 1.5 in needle ? ?Arthrogram: No ? ?Medications: 2 mL lidocaine 1 %; 30 mg Hyaluronan 30 MG/2ML ?Outcome: tolerated well, no immediate complications ?Procedure, treatment alternatives, risks and benefits explained, specific risks discussed. Consent was given by the patient. Immediately prior to procedure a time out was called to verify the correct patient, procedure, equipment, support staff and site/side marked as required. Patient was prepped and draped in the usual sterile fashion.  ? ? ? ? ?Clinical Data: ?No additional findings. ? ? ?Subjective: ?Chief Complaint  ?Patient presents with  ? Right Knee - Pain  ?Returns for third and final Orthovisc injection right knee has done well with the prior 2 without any problems and does feel like they have made a significant difference in terms of her pain ? ?HPI ? ?Review of Systems ? ? ?Objective: ?Vital Signs: There were no vitals taken for this visit. ? ?Physical Exam ? ?Ortho Exam right knee was not hot red warm or swollen.  No effusion.  No medial lateral joint pain.  No calf pain.  No distal edema ? ?Specialty Comments:  ?No  specialty comments available. ? ?Imaging: ?No results found. ? ? ?PMFS History: ?Patient Active Problem List  ? Diagnosis Date Noted  ? Bilateral primary osteoarthritis of knee 07/23/2021  ? Episodic lightheadedness 04/30/2021  ? Pain in right knee 12/12/2020  ? Anemia 04/09/2020  ? Carotid arterial disease ([place]) 01/23/2019  ? RBBB (right bundle branch block with left anterior fascicular block)   ? Chest pain 01/22/2019  ? Weakness of both lower extremities 01/02/2019  ? Frequent falls 01/02/2019  ? OSA on CPAP 06/15/2017  ? Diabetes ([place]) 03/02/2017  ? Herpes zoster without complication 35/57/3220  ? B12 nutritional deficiency 02/23/2017  ? Vitamin D deficiency 12/08/2016  ? Tubular adenoma of colon 04/29/2016  ? Essential hypertension 09/12/2015  ? Allergic rhinitis 07/17/2015  ? Insomnia 07/17/2015  ? Obese 04/10/2015  ? S/P left TKA 04/09/2015  ? Osteopenia 12/04/2012  ? 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 RESTLESS LEG SYNDROME 02/11/2009  ? LEG EDEMA, BILATERAL 02/11/2009  ? Hyperlipidemia 10/30/2008  ? Urinary incontinence 10/30/2008  ? Skin cancer 10/30/2008  ? Asthma 11/21/2007  ? Venous (peripheral) insufficiency 11/22/2006  ? ?Past Medical History:  ?Diagnosis Date  ? Allergic rhinitis   ? Apnea   ? Arthritis   ? Asthma   ? very mild  ? Back pain   ? Bilateral lower extremity edema   ?  Carotid artery disease ([place])   ? a. Carotid duplex 03/2016 - duplex was stable, 11-91% RICA, 4-78% LICA  ? Complication of anesthesia   ? post-op delirium  ? Endometrial polyp   ? Essential hypertension   ? GERD (gastroesophageal reflux disease)   ? Heart murmur   ? per pt  ? History of adenomatous polyp of colon   ? 04-24-2016  tubular adenoma  ? History of squamous cell carcinoma excision   ? pilonidal area second excision 02-02-2002  ? HTN (hypertension)   ? Hyperlipidemia   ? Leg pain   ? OAB (overactive bladder)   ? RA (rheumatoid arthritis) ([place])   ? RBBB (right bundle branch block with left anterior fascicular block)   ?  RLS (restless legs syndrome)   ? Shortness of breath on exertion   ? SUI (stress urinary incontinence, female)   ? Venous insufficiency   ? legs  ? Vitamin D deficiency   ?  ?Family History  ?Problem Relation Age of Onset  ? Esophageal cancer Father   ? Heart disease Father   ?     Unknown what kind  ? Hyperlipidemia Father   ? Hypertension Father   ? Cancer Father   ? Heart failure Father   ? Hyperlipidemia Mother   ? Hypertension Mother   ? CAD Sister   ?     10 years younger than patient - had stent. Was told that her anorexia/bulimia may have played a role  ? Stroke Maternal Grandmother 37  ? Diabetes Maternal Grandmother   ? Heart attack Maternal Grandfather 93  ? COPD Maternal Grandfather   ? Heart attack Paternal Grandmother   ?     23s  ? Diabetes Other   ?     MGGM  ?  ?Past Surgical History:  ?Procedure Laterality Date  ? APPENDECTOMY  07-02-2005   [name]  ? open  ? CARDIAC CATHETERIZATION  01-23-2009  dr Darnell Level brodie  ? normal coronary arteries and lvf  ? CARDIOVASCULAR STRESS TEST  03/31/2016  ? Low risk nuclear study w/ medium defect of mild severity in basal anterior and mid anterior location w/ no evidence ischemia or infarction/  normal LV function and wall motion,  nuclear stress ef 71%  ? CATARACT EXTRACTION W/ INTRAOCULAR LENS IMPLANT Left 09/2016  ? COLONOSCOPY  last one 04-24-2016  ? HYSTEROSCOPY WITH D & C N/A 12/22/2016  ? Procedure: DILATATION AND CURETTAGE /HYSTEROSCOPY;  Surgeon: Dian Queen, MD;  [facility];  Service: Gynecology;  Laterality: N/A;  ? IR US GUIDANCE  09/08/2016  ? POSTERIOR LUMBAR FUSION  02/ [facility] ([place], [place])  ? SKIN CANCER EXCISION    ? TONSILLECTOMY  child  ? TOTAL KNEE ARTHROPLASTY Left 04/09/2015  ? Procedure: LEFT TOTAL KNEE ARTHROPLASTY;  Surgeon: Paralee Cancel, MD;  Location: [facility];  Service: Orthopedics;  Laterality: Left;  ? TRANSTHORACIC ECHOCARDIOGRAM  01/21/2009  ? mild LVH, ef 29-56%, grade 1 diastolic  dysfunction  ? ULNAR NERVE TRANSPOSITION Left 12-20-2008  dr sypher  ? decompression and partial resection medial triceps fascia  ? WIDE LOCAL EXCISION PILONIDAL AREA  02-02-2002  [name]  ? recurrent squamous cell carcinoma in situ  ? ?Social History  ? ?Occupational History  ? Occupation: retired Pharmacist, hospital  ?Tobacco Use  ? Smoking status: Former  ?  Packs/day: 0.50  ?  Years: 10.00  ?  Pack years: 5.00  ?  Types:  Cigarettes  ?  Quit date: 12/10/1973  ?  Years since quitting: 47.7  ? Smokeless tobacco: Never  ?Vaping Use  ? Vaping Use: Never used  ?Substance and Sexual Activity  ? Alcohol use: Yes  ?  Comment: occasional  ? Drug use: No  ? Sexual activity: Not on file  ? ? ? ?Garald Balding, MD ? ? ?Note - This record has been created using [employer].  ?Chart creation errors have been sought, but may not always  ?have been located. Such creation errors do not reflect on  ?the standard of medical care. ? ? ?

## 2021-09-05 ENCOUNTER — Encounter: Payer: Self-pay | Admitting: Orthopaedic Surgery

## 2021-09-13 ENCOUNTER — Other Ambulatory Visit: Payer: Self-pay | Admitting: Internal Medicine

## 2021-09-13 DIAGNOSIS — E119 Type 2 diabetes mellitus without complications: Secondary | ICD-10-CM

## 2021-09-28 ENCOUNTER — Encounter: Payer: Self-pay | Admitting: Internal Medicine

## 2021-09-28 DIAGNOSIS — M545 Low back pain, unspecified: Secondary | ICD-10-CM

## 2021-09-29 ENCOUNTER — Encounter: Payer: Self-pay | Admitting: Cardiovascular Disease

## 2021-09-29 ENCOUNTER — Ambulatory Visit: Payer: Medicare PPO | Admitting: Cardiovascular Disease

## 2021-09-29 VITALS — BP 107/60 | HR 97 | Ht 66.0 in | Wt 201.4 lb

## 2021-09-29 DIAGNOSIS — I5032 Chronic diastolic (congestive) heart failure: Secondary | ICD-10-CM | POA: Diagnosis not present

## 2021-09-29 DIAGNOSIS — I6523 Occlusion and stenosis of bilateral carotid arteries: Secondary | ICD-10-CM

## 2021-09-29 DIAGNOSIS — E785 Hyperlipidemia, unspecified: Secondary | ICD-10-CM | POA: Diagnosis not present

## 2021-09-29 DIAGNOSIS — G4733 Obstructive sleep apnea (adult) (pediatric): Secondary | ICD-10-CM

## 2021-09-29 DIAGNOSIS — I10 Essential (primary) hypertension: Secondary | ICD-10-CM

## 2021-09-29 DIAGNOSIS — E875 Hyperkalemia: Secondary | ICD-10-CM | POA: Diagnosis not present

## 2021-09-29 MED ORDER — HYDROCODONE-ACETAMINOPHEN 5-325 MG PO TABS
1.0000 | ORAL_TABLET | Freq: Every day | ORAL | 0 refills | Status: DC
Start: 2021-09-29 — End: 2021-11-12

## 2021-09-29 MED ORDER — HYDRALAZINE HCL 50 MG PO TABS: 50.0000 mg | ORAL_TABLET | Freq: Three times a day (TID) | ORAL | 3 refills | Status: DC

## 2021-09-29 MED ORDER — FUROSEMIDE 20 MG PO TABS: 20.0000 mg | ORAL_TABLET | Freq: Every day | ORAL | 3 refills | Status: DC

## 2021-09-29 NOTE — Patient Instructions (Addendum)
Medication Instructions:  DECREASE the Hydralazine to 50 mg three times daily  Furosemide: Temporarily can take Furosemide 40 mg once daily for a weight over 201 pounds. Do not take the Furosemide for a weight under 196 pounds.  *If you need a refill on your cardiac medications before your next appointment, please call your pharmacy*   Lab Work: Labs today  If you have labs (blood work) drawn today and your tests are completely normal, you will receive your results only by: [place] (if you have MyChart) OR A paper copy in the mail If you have any lab test that is abnormal or we need to change your treatment, we will call you to review the results.   Testing/Procedures: None ordered   Follow-Up: At [facility], you and your health needs are our priority.  As part of our continuing mission to provide you with exceptional heart care, we have created designated Provider Care Teams.  These Care Teams include your primary Cardiologist (physician) and Advanced Practice Providers (APPs -  Physician Assistants and Nurse Practitioners) who all work together to provide you with the care you need, when you need it.  We recommend signing up for the patient portal called "MyChart".  Sign up information is provided on this After Visit Summary.  MyChart is used to connect with patients for Virtual Visits (Telemedicine).  Patients are able to view lab/test results, encounter notes, upcoming appointments, etc.  Non-urgent messages can be sent to your provider as well.   To learn more about what you can do with MyChart, go to [email or web address].    Your next appointment:   12 month(s)  The format for your next appointment:   In Person  Provider:   Sanda Klein, MD {   Important Information About Sugar

## 2021-09-29 NOTE — Progress Notes (Unsigned)
Cardiology Office Note:    Date:  10/01/2021   ID:  Erin Roach, DOB 1940/07/08, MRN [id number]  PCP:  Erin Rail, MD  Cardiologist:  Sanda Klein, MD  Electrophysiologist:  None   Referring MD: Erin Rail, MD   No chief complaint on file.   History of Present Illness:    Erin Roach is a 81 y.o. female with a hx of PAD (moderate bilateral carotid stenosis), bifascicular block, diabetes mellitus type 2, hypertension, hyperlipidemia, obesity, obstructive sleep apnea, chronic lower extremity edema.  After starting treatment with diuretics lower extremity edema improved, she did have some problems with transient dizziness.  She has been logging her weights daily, as well as her blood pressure.  Her weight dropped fairly abruptly from a maximum of 205 pounds to 197 pounds in the course of about 48 hours.  Her blood pressure was never recorded as low.  Her renal function parameters worsened (creatinine 1-1.5) and her echocardiogram showed hyperdynamic left ventricular systolic function with an ejection fraction greater than 70%.  She was advised to stop diuretics.  A few days later she woke up in the middle the night short of breath and had to sit up for a couple of hours before she can fall asleep again.  Last night towards the morning she had some problems with shortness of breath when lying flat.  This appeared to occur when her weight exceeded 202 pounds.  We seem to be achieving a balance with the diuretic dosing now.  Current weight around 200 pounds seems to offer the optimal balance between cramping and dizziness from hypovolemia versus excessive leg edema and orthopnea.  She has reconsidered her decision to try an implantable inspire device and will try a new type of CPAP mask instead.  Currently does not have orthopnea, PND and her ankle edema is tolerable (1-2+).  She has occasional mild orthostatic dizziness, but has not had syncope or falls and for the most part  feels better.  She has not had hypotension at home where her blood pressure earlier today was 134/73, although here at the office it is 107/60.  She is planning a trip.  I pointed out that she needs to be especially careful with sodium restriction while traveling.  We have twice tried treatment with SGLT2 inhibitors (Jardiance) and each time she developed anion gap metabolic acidosis, that promptly corrected after discontinuing the medication.  I reviewed her echocardiogram images.  I agree that it they show a hyperdynamic left ventricle with EF greater than 70%.  There is mild LVH.  The left atrium is moderately dilated.  In my opinion there is no left ventricular outflow tract obstruction, the "gradient" is exclusively mid cavitary due to hyperdynamic function.  There is no mitral regurgitation.  Mitral inflow pattern is consistent with impaired relaxation.  E/e' is increased (12.9-15.6), consistent with increased mean left atrial pressure despite the hyperdynamic left ventricular function   - 2010 normal cardiac catheterization - 2019 Myoview in 2019 low risk with no reversible ischemia, EF 71%, basal mid anterior attenuation artifact - September 2020   - episode of chest pain, cardiac enzymes and ECG negative.  - carotid doppler 40 to 59% right ICA stenosis, 1 to 39% left ICA stenosis.     - Echo EF>65%, impaired relaxation of the LV, mild LAE, trivial MR  - LE ABI (December 2020) showed triphasic blood flow bilaterally, no PAD  She has not had any recurrent episodes of chest pain  since then.  She takes omeprazole "only as needed".  She has a lot of problems with severe restless leg syndromes, although she uses CPAP for sleep apnea.  She is on multiple medications including gabapentin, amitriptyline, pramipexole, Lyrica.  She only scored 7 points on the Epworth Sleepiness Scale.  She has mild intermittent lower extremity edema on a very low-dose of loop diuretic.    She has a history of a knee  replacement in 2016 and back surgery in 2018.  Some limitations due to musculoskeletal pain.  She exercises regularly (physical therapy type exercises) 15 minutes a day 6 days a week and also walks.  Past Medical History:  Diagnosis Date   Allergic rhinitis    Apnea    Arthritis    Asthma    very mild   Back pain    Bilateral lower extremity edema    Carotid artery disease (HCC)    a. Carotid duplex 03/2016 - duplex was stable, 33-35% RICA, 4-56% LICA   Complication of anesthesia    post-op delirium   Endometrial polyp    Essential hypertension    GERD (gastroesophageal reflux disease)    Heart murmur    per pt   History of adenomatous polyp of colon    04-24-2016  tubular adenoma   History of squamous cell carcinoma excision    pilonidal area second excision 02-02-2002   HTN (hypertension)    Hyperlipidemia    Leg pain    OAB (overactive bladder)    RA (rheumatoid arthritis) (HCC)    RBBB (right bundle branch block with left anterior fascicular block)    RLS (restless legs syndrome)    Shortness of breath on exertion    SUI (stress urinary incontinence, female)    Venous insufficiency    legs   Vitamin D deficiency     Past Surgical History:  Procedure Laterality Date   APPENDECTOMY  07-02-2005   [name]   open   CARDIAC CATHETERIZATION  01-23-2009  dr Darnell Level brodie   normal coronary arteries and lvf   CARDIOVASCULAR STRESS TEST  03/31/2016   Low risk nuclear study w/ medium defect of mild severity in basal anterior and mid anterior location w/ no evidence ischemia or infarction/  normal LV function and wall motion,  nuclear stress ef 71%   CATARACT EXTRACTION W/ INTRAOCULAR LENS IMPLANT Left 09/2016   COLONOSCOPY  last one 04-24-2016   HYSTEROSCOPY WITH D & C N/A 12/22/2016   Procedure: DILATATION AND CURETTAGE /HYSTEROSCOPY;  Surgeon: Dian Queen, MD;  Location: [facility];  Service: Gynecology;  Laterality: N/A;   IR US GUIDANCE  09/08/2016    POSTERIOR LUMBAR FUSION  02/ 2018    [facility] ([place]   SKIN CANCER EXCISION     TONSILLECTOMY  child   TOTAL KNEE ARTHROPLASTY Left 04/09/2015   Procedure: LEFT TOTAL KNEE ARTHROPLASTY;  Surgeon: Paralee Cancel, MD;  Location: [facility];  Service: Orthopedics;  Laterality: Left;   TRANSTHORACIC ECHOCARDIOGRAM  01/21/2009   mild LVH, ef 25-63%, grade 1 diastolic dysfunction   ULNAR NERVE TRANSPOSITION Left 12-20-2008  dr sypher   decompression and partial resection medial triceps fascia   WIDE LOCAL EXCISION PILONIDAL AREA  02-02-2002  [name]   recurrent squamous cell carcinoma in situ    Current Medications: Current Meds  Medication Sig   amitriptyline (ELAVIL) 50 MG tablet Take 1 tablet (50 mg total) by mouth at bedtime.   Blood Glucose Monitoring Suppl (  ACCU-CHEK GUIDE) w/Device KIT Use to check blood sugars daily   ergocalciferol (VITAMIN D2) 1.25 MG (50000 UT) capsule Take 1 capsule (50,000 Units total) by mouth once a week. Take one tablet by mouth on Mondays   ezetimibe (ZETIA) 10 MG tablet Take 1 tablet (10 mg total) by mouth daily.   fluticasone (FLONASE) 50 MCG/ACT nasal spray Place 1 spray into both nostrils daily as needed for allergies.   HORIZANT 600 MG TBCR Take 1 tablet by mouth at bedtime.    hydrALAZINE (APRESOLINE) 50 MG tablet Take 1 tablet (50 mg total) by mouth 3 (three) times daily.   HYDROcodone-acetaminophen (NORCO/VICODIN) 5-325 MG tablet Take 1-2 tablets by mouth at bedtime.   losartan (COZAAR) 100 MG tablet TAKE ONE TABLET BY MOUTH DAILY   metFORMIN (GLUCOPHAGE) 500 MG tablet Take 1 tablet (500 mg total) by mouth 2 (two) times daily with a meal.   pramipexole (MIRAPEX) 0.25 MG tablet Take 0.25 mg by mouth daily.   pregabalin (LYRICA) 75 MG capsule TAKE ONE CAPSULE BY MOUTH EVERY AFTERNOON AND TAKE TWO CAPSULES BY MOUTH EVERY NIGHT AT BEDTIME   rOPINIRole (REQUIP) 0.25 MG tablet TAKE ONE TO TWO TABLETS BY MOUTH EVERY NIGHT AT BEDTIME    rosuvastatin (CRESTOR) 20 MG tablet Take 1 tablet (20 mg total) by mouth daily.   simvastatin (ZOCOR) 40 MG tablet TAKE ONE TABLET BY MOUTH EVERY NIGHT AT BEDTIME   spironolactone (ALDACTONE) 25 MG tablet Take 1 tablet (25 mg total) by mouth daily.   TECHLITE PEN NEEDLES 32G X 4 MM MISC USE TWICE A DAY AS DIRECTED   VICTOZA 18 MG/3ML SOPN DIAL AND INJECT SUBCUTANEOUSLY 1.8MG DAILY EVERY MORNING   [DISCONTINUED] furosemide (LASIX) 20 MG tablet Take 1 tablet (20 mg total) by mouth daily.   [DISCONTINUED] hydrALAZINE (APRESOLINE) 100 MG tablet Take 1 tablet (100 mg total) by mouth 3 (three) times daily. Keep scheduled appointment for further refills, 1st attempt     Allergies:   Patient has no active allergies.   Social History   Socioeconomic History   Marital status: Married    Spouse name: Ted   Number of children: 2   Years of education: Not on file   Highest education level: Not on file  Occupational History   Occupation: retired Pharmacist, hospital  Tobacco Use   Smoking status: Former    Packs/day: 0.50    Years: 10.00    Pack years: 5.00    Types: Cigarettes    Quit date: 12/10/1973    Years since quitting: 47.8   Smokeless tobacco: Never  Vaping Use   Vaping Use: Never used  Substance and Sexual Activity   Alcohol use: Yes    Comment: occasional   Drug use: No   Sexual activity: Not on file  Other Topics Concern   Not on file  Social History Narrative   2 children ages 42,35   Social Determinants of Health   Financial Resource Strain: Not on file  Food Insecurity: Not on file  Transportation Needs: Not on file  Physical Activity: Not on file  Stress: Not on file  Social Connections: Not on file     Family History: The patient's family history includes CAD in her sister; COPD in her maternal grandfather; Cancer in her father; Diabetes in her maternal grandmother and another family member; Esophageal cancer in her father; Heart attack in her paternal grandmother; Heart  attack (age of onset: 27) in her maternal grandfather; Heart disease in her father;  Heart failure in her father; Hyperlipidemia in her father and mother; Hypertension in her father and mother; Stroke (age of onset: 32) in her maternal grandmother.  ROS:   Please see the history of present illness.     All other systems reviewed and are negative.  EKGs/Labs/Other Studies Reviewed:    The following studies were reviewed today: Carotid duplex ultrasound 01/23/2019: Summary: Right Carotid: Velocities in the right ICA are consistent with a 40-59% stenosis. No significant change from pervious study.   Left Carotid: Velocities in the left ICA are consistent with a 1-39% stenosis. No significant change from previous study.   Vertebrals:  Bilateral vertebral arteries demonstrate antegrade flow. Subclavians: Normal flow hemodynamics were seen in bilateral subclavian arteries.  ECHO 08/04/2021:  1. Left ventricular ejection fraction, by estimation, is 70 to 75%. The  left ventricle has hyperdynamic function. The left ventricle has no  regional wall motion abnormalities. There is mild concentric left  ventricular hypertrophy. Left ventricular  diastolic parameters are consistent with Grade I diastolic dysfunction  (impaired relaxation). Intracavitary and LVOT gradients are present. The  resting LVOT gradient is 40mmHg and 67mmHg with valsalva.   2. Right ventricular systolic function is normal. The right ventricular  size is normal. Tricuspid regurgitation signal is inadequate for assessing  PA pressure.   3. Left atrial size was mildly dilated.   4. The mitral valve is abnormal. Trivial mitral valve regurgitation.  Moderate to severe mitral annular calcification.   5. The aortic valve was not well visualized. Aortic valve regurgitation  is not visualized. Aortic valve sclerosis/calcification is present,  without any evidence of aortic stenosis.   6. The inferior vena cava is normal in size  with greater than 50%  respiratory variability, suggesting right atrial pressure of 3 mmHg.   Comparison(s): Compared to prior TTE in 2020, there is no significant  change.    EKG:  EKG is ordered today.  Personally reviewed, shows mild sinus tachycardia, biatrial enlargement, bifascicular block (RBBB plus LAFB).  Recent Labs: 04/30/2021: ALT 23; Hemoglobin 11.7; Platelets 277.0 09/29/2021: BNP 11.6; BUN 30; Creatinine, Ser 1.38; Potassium 5.6; Sodium 136  Recent Lipid Panel    Component Value Date/Time   CHOL 137 09/29/2021 1217   TRIG 166 (H) 09/29/2021 1217   TRIG 101 05/19/2006 0832   HDL 54 09/29/2021 1217   CHOLHDL 2.5 09/29/2021 1217   CHOLHDL 3 04/10/2020 1445   VLDL 45.0 (H) 04/10/2020 1445   LDLCALC 55 09/29/2021 1217   LDLDIRECT 103.0 04/10/2020 1445    Physical Exam:    VS:  BP 107/60 (BP Location: Left Arm, Patient Position: Sitting, Cuff Size: Large)   Pulse 97   Ht $R'5\' 6"'NO$  (1.676 m)   Wt 201 lb 7.2 oz (91.4 kg)   SpO2 96%   BMI 32.51 kg/m     Wt Readings from Last 3 Encounters:  09/29/21 201 lb 7.2 oz (91.4 kg)  08/11/21 202 lb 3.2 oz (91.7 kg)  07/24/21 203 lb (92.1 kg)     General: Alert, oriented x3, no distress, mildly obese Head: no evidence of trauma, PERRL, EOMI, no exophtalmos or lid lag, no myxedema, no xanthelasma; normal ears, nose and oropharynx Neck: normal jugular venous pulsations and no hepatojugular reflux; brisk carotid pulses without delay and no carotid bruits Chest: clear to auscultation, no signs of consolidation by percussion or palpation, normal fremitus, symmetrical and full respiratory excursions Cardiovascular: normal position and quality of the apical impulse, regular rhythm, normal first and  second heart sounds, no murmurs, rubs or gallops Abdomen: no tenderness or distention, no masses by palpation, no abnormal pulsatility or arterial bruits, normal bowel sounds, no hepatosplenomegaly Extremities: no clubbing, cyanosis; 1-2+  symmetrical pedal and ankle swelling; 2+ radial, ulnar and brachial pulses bilaterally; 2+ right femoral, posterior tibial and dorsalis pedis pulses; 2+ left femoral, posterior tibial and dorsalis pedis pulses; no subclavian or femoral bruits Neurological: grossly nonfocal Psych: Normal mood and affect    ASSESSMENT:    1. Chronic diastolic heart failure ([place])   2. 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  Has had some problems with orthostatic hypotension when her weight was less than 195 pounds and some problems with shortness of breath at rest when her weight was over 202 pounds.  Did not tolerate SGLT2 inhibitors due to anion gap metabolic acidosis.  Requires a relatively low-dose of loop diuretic to keep her in "dry weight" range which is probably 198-200 pounds.  Continue with a reduced sodium diet, daily weight monitoring.  This will be especially important when she is traveling and eating food cooked in restaurants. OSA: Not yet consistently using CPAP but is trying to get a new device. PAD: Carotid ultrasound has shown stable findings for several consecutive years.  She has no neurological complaints. HLP: Ideally would like to keep her LDL less than 70.  We have achieved this after switching to rosuvastatin.  Continue this. DM: Well-controlled.  Hemoglobin A1c was most recently 6.2%.  On GLP-1 agonist and metformin. HTN: Excellent control.  Continues to have some resting tachycardia likely due to the vasodilatory effects of hydralazine.  We will try to decrease the dose of hydralazine in half.  Has borderline hyperkalemia on spironolactone and losartan.  May need to decrease the dose of spironolactone as well in the future. RTA: Not a firm  diagnosis, but I wonder whether her response to SGLT2 inhibitors and spironolactone shows that she has a form for rest of RTA type IV.    Medication Adjustments/Labs and Tests Ordered: Current medicines are reviewed at length with the patient today.  Concerns regarding medicines are outlined above.  Orders Placed This Encounter  Procedures   Basic metabolic panel   Brain natriuretic peptide   Lipid panel   Meds ordered this encounter  Medications   furosemide (LASIX) 20 MG tablet    Sig: Take 1 tablet (20 mg total) by mouth daily.    Dispense:  90 tablet    Refill:  3   hydrALAZINE (APRESOLINE) 50 MG tablet    Sig: Take 1 tablet (50 mg total) by mouth 3 (three) times daily.    Dispense:  270 tablet    Refill:  3    Patient Instructions  Medication Instructions:  DECREASE the Hydralazine to 50 mg three times daily  Furosemide: Temporarily can take Furosemide 40 mg once daily for a weight over 201 pounds. Do not take the Furosemide for a weight under 196 pounds.  *If you need a refill on your cardiac medications before your next appointment, please call your pharmacy*   Lab Work: Labs today  If you have labs (blood work) drawn today and your tests are completely normal, you will receive your results only by: [place] (if you  have MyChart) OR A paper copy in the mail If you have any lab test that is abnormal or we need to change your treatment, we will call you to review the results.   Testing/Procedures: None ordered   Follow-Up: At [facility], you and your health needs are our priority.  As part of our continuing mission to provide you with exceptional heart care, we have created designated Provider Care Teams.  These Care Teams include your primary Cardiologist (physician) and Advanced Practice Providers (APPs -  Physician Assistants and Nurse Practitioners) who all work together to provide you with the care you need, when you need it.  We recommend signing  up for the patient portal called "MyChart".  Sign up information is provided on this After Visit Summary.  MyChart is used to connect with patients for Virtual Visits (Telemedicine).  Patients are able to view lab/test results, encounter notes, upcoming appointments, etc.  Non-urgent messages can be sent to your provider as well.   To learn more about what you can do with MyChart, go to [email or web address].    Your next appointment:   12 month(s)  The format for your next appointment:   In Person  Provider:   Sanda Klein, MD {   Important Information About Sugar         Signed, Sanda Klein, MD  10/01/2021 4:10 PM    [place]

## 2021-09-30 LAB — LIPID PANEL
Chol/HDL Ratio: 2.5 ratio (ref 0.0–4.4)
Cholesterol, Total: 137 mg/dL (ref 100–199)
HDL: 54 mg/dL (ref >39)
LDL Chol Calc (NIH): 55 mg/dL (ref 0–99)
Triglycerides: 166 mg/dL — ABNORMAL HIGH (ref 0–149)
VLDL Cholesterol Cal: 28 mg/dL (ref 5–40)

## 2021-09-30 LAB — BASIC METABOLIC PANEL
BUN/Creatinine Ratio: 22 (ref 12–28)
BUN: 30 mg/dL — ABNORMAL HIGH (ref 8–27)
CO2: 21 mmol/L (ref 20–29)
Calcium: 9.1 mg/dL (ref 8.7–10.3)
Chloride: 101 mmol/L (ref 96–106)
Creatinine, Ser: 1.38 mg/dL — ABNORMAL HIGH (ref 0.57–1.00)
Glucose: 92 mg/dL (ref 70–99)
Potassium: 5.6 mmol/L — ABNORMAL HIGH (ref 3.5–5.2)
Sodium: 136 mmol/L (ref 134–144)
eGFR: 39 mL/min/{1.73_m2} — ABNORMAL LOW (ref >59)

## 2021-09-30 LAB — BRAIN NATRIURETIC PEPTIDE: BNP: 11.6 pg/mL (ref 0.0–100.0)

## 2021-10-01 ENCOUNTER — Encounter: Payer: Self-pay | Admitting: Cardiovascular Disease

## 2021-10-01 ENCOUNTER — Ambulatory Visit: Payer: Medicare PPO | Admitting: Cardiovascular Disease

## 2021-10-02 ENCOUNTER — Other Ambulatory Visit: Payer: Self-pay | Admitting: *Deleted

## 2021-10-02 MED ORDER — SPIRONOLACTONE 25 MG PO TABS: 12.5000 mg | ORAL_TABLET | Freq: Every day | ORAL | 3 refills | Status: DC

## 2021-10-28 DIAGNOSIS — Z124 Encounter for screening for malignant neoplasm of cervix: Secondary | ICD-10-CM | POA: Diagnosis not present

## 2021-10-28 DIAGNOSIS — Z1151 Encounter for screening for human papillomavirus (HPV): Secondary | ICD-10-CM | POA: Diagnosis not present

## 2021-10-28 DIAGNOSIS — Z6834 Body mass index (BMI) 34.0-34.9, adult: Secondary | ICD-10-CM | POA: Diagnosis not present

## 2021-11-01 LAB — HM DIABETES EYE EXAM

## 2021-11-03 DIAGNOSIS — I739 Peripheral vascular disease, unspecified: Secondary | ICD-10-CM | POA: Diagnosis not present

## 2021-11-03 DIAGNOSIS — L603 Nail dystrophy: Secondary | ICD-10-CM | POA: Diagnosis not present

## 2021-11-03 DIAGNOSIS — B351 Tinea unguium: Secondary | ICD-10-CM | POA: Diagnosis not present

## 2021-11-05 ENCOUNTER — Ambulatory Visit: Payer: Medicare PPO | Admitting: Internal Medicine

## 2021-11-05 ENCOUNTER — Encounter: Payer: Self-pay | Admitting: Internal Medicine

## 2021-11-05 VITALS — BP 122/66 | HR 87 | Temp 98.6°F | Ht 66.0 in | Wt 206.0 lb

## 2021-11-05 DIAGNOSIS — G2581 Restless legs syndrome: Secondary | ICD-10-CM | POA: Diagnosis not present

## 2021-11-05 DIAGNOSIS — I10 Essential (primary) hypertension: Secondary | ICD-10-CM

## 2021-11-05 DIAGNOSIS — R251 Tremor, unspecified: Secondary | ICD-10-CM | POA: Diagnosis not present

## 2021-11-05 DIAGNOSIS — E1129 Type 2 diabetes mellitus with other diabetic kidney complication: Secondary | ICD-10-CM

## 2021-11-05 DIAGNOSIS — R809 Proteinuria, unspecified: Secondary | ICD-10-CM | POA: Diagnosis not present

## 2021-11-05 DIAGNOSIS — R29898 Other symptoms and signs involving the musculoskeletal system: Secondary | ICD-10-CM | POA: Diagnosis not present

## 2021-11-05 DIAGNOSIS — G4733 Obstructive sleep apnea (adult) (pediatric): Secondary | ICD-10-CM

## 2021-11-05 DIAGNOSIS — Z9989 Dependence on other enabling machines and devices: Secondary | ICD-10-CM | POA: Diagnosis not present

## 2021-11-05 MED ORDER — ASPIRIN 81 MG PO TBEC: 81.0000 mg | DELAYED_RELEASE_TABLET | Freq: Every day | ORAL | 12 refills | Status: AC

## 2021-11-05 NOTE — Assessment & Plan Note (Signed)
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

## 2021-11-05 NOTE — Assessment & Plan Note (Signed)
Acute Had an episode this morning that lasted about 20 minutes of left hand weakness and numbness.  No other neurological deficits Since then has felt fine and has not had any concerning symptoms Concerning for possible TIA Referral to Dr. Carles Collet per her request-she has seen her in the past for tremor which she is also concerned about We will request MRI brain Start aspirin 81 mg daily She will need other tests including carotid ultrasounds, echocardiogram, but at this point she is overwhelmed with multiple medical problems so we will hold off on ordering some of those at this time

## 2021-11-05 NOTE — Progress Notes (Signed)
Subjective:    Patient ID: Erin Roach, female    DOB: 1941/01/26, 81 y.o.   MRN: [id number]      HPI Erin Roach is here for  Chief Complaint  Patient presents with   Referral    Left arm and hand not able to grip object   She is here today with her husband.  This morning she noticed that her left hand was weak.  There may have also been some numbness, which she does not recall, but her husband states that she did mention it to him.  She had picked up a coffee cup and dropped it.  She then picked up a glass of water and dropped it.  She had her phone in her hand and then dropped back.  This lasted about 20 minutes.  She denies any other symptoms and her husband was present and did not notice any other symptoms.  She was able to lift her left arm.  She was speaking normally and had no difficulty swallowing or understanding.  Her husband did not notice any facial asymmetry.  Her balance is poor to begin with, but she was able to ambulate without change from her baseline. She has not had any concerning symptoms since then.  Tremor-this is chronic.  She has bilateral hand tremor.  She does have a family history of tremor.  She thinks this has gotten worse and she is very concerned about the possibility of Parkinson's.  She has seen Dr. Carles Collet in the past and would like to see her again.  She is on medications that could make her tremor worse.  She has terrible balance and that is chronic and likely multifactorial.  There has been no change acutely, but it has gotten worse over time and her and her husband are concerned about that.  Sleep apnea-currently following with a 6 sleep medicine doctor in [place].  She had been referred for inspire, but canceled the appointment.  We did discuss the importance of seeing ENT for further evaluation since her sleep apnea is untreated and may be contributing to some of her symptoms.  RLS-she did see a neurologist at 1 point for her RLS.  I am currently  managing her medication.  She is on multiple medications and recently thinks her RLS has gotten a little better, but still has difficulty sleeping and likely is having significant side effects from the medications.  She should be taking amitriptyline 50 mg at night and is actually taking 100 mg at night, which she just realized.  She also is concerned about weight gain.  She is not eating that much, but still gaining weight.  She is taking the as prescribed.  Her husband states that he thinks she snacks at night.  Medications and allergies reviewed with patient and updated if appropriate.  Current Outpatient Medications on File Prior to Visit  Medication Sig Dispense Refill   amitriptyline (ELAVIL) 50 MG tablet Take 1 tablet (50 mg total) by mouth at bedtime.     Blood Glucose Monitoring Suppl (ACCU-CHEK GUIDE) w/Device KIT Use to check blood sugars daily 1 kit 0   ergocalciferol (VITAMIN D2) 1.25 MG (50000 UT) capsule Take 1 capsule (50,000 Units total) by mouth once a week. Take one tablet by mouth on Mondays 12 capsule 0   ezetimibe (ZETIA) 10 MG tablet Take 1 tablet (10 mg total) by mouth daily. 90 tablet 3   furosemide (LASIX) 20 MG tablet Take 1 tablet (20 mg total)  by mouth daily. 90 tablet 3   HORIZANT 600 MG TBCR Take 1 tablet by mouth at bedtime.      hydrALAZINE (APRESOLINE) 50 MG tablet Take 1 tablet (50 mg total) by mouth 3 (three) times daily. 270 tablet 3   HYDROcodone-acetaminophen (NORCO/VICODIN) 5-325 MG tablet Take 1-2 tablets by mouth at bedtime. 60 tablet 0   losartan (COZAAR) 100 MG tablet TAKE ONE TABLET BY MOUTH DAILY 90 tablet 1   metFORMIN (GLUCOPHAGE) 500 MG tablet Take 1 tablet (500 mg total) by mouth 2 (two) times daily with a meal. 180 tablet 3   pregabalin (LYRICA) 75 MG capsule TAKE ONE CAPSULE BY MOUTH EVERY AFTERNOON AND TAKE TWO CAPSULES BY MOUTH EVERY NIGHT AT BEDTIME 270 capsule 0   rOPINIRole (REQUIP) 0.25 MG tablet TAKE ONE TO TWO TABLETS BY MOUTH EVERY NIGHT  AT BEDTIME 180 tablet 1   rosuvastatin (CRESTOR) 20 MG tablet Take 1 tablet (20 mg total) by mouth daily. 90 tablet 3   simvastatin (ZOCOR) 40 MG tablet TAKE ONE TABLET BY MOUTH EVERY NIGHT AT BEDTIME 90 tablet 0   spironolactone (ALDACTONE) 25 MG tablet Take 0.5 tablets (12.5 mg total) by mouth daily. 45 tablet 3   TECHLITE PEN NEEDLES 32G X 4 MM MISC USE TWICE A DAY AS DIRECTED 100 each 2   VICTOZA 18 MG/3ML SOPN DIAL AND INJECT SUBCUTANEOUSLY 1.8MG DAILY EVERY MORNING 3 mL 3   No current facility-administered medications on file prior to visit.    Review of Systems  Respiratory:  Positive for shortness of breath (chronic intermittent).   Cardiovascular:  Negative for chest pain and palpitations.  Neurological:  Positive for dizziness, tremors and light-headedness. Negative for speech difficulty, numbness and headaches.       Objective:   Vitals:   11/05/21 1542  BP: 122/66  Pulse: 87  Temp: 98.6 F (37 C)  SpO2: 93%   BP Readings from Last 3 Encounters:  11/05/21 122/66  09/29/21 107/60  08/11/21 137/70   Wt Readings from Last 3 Encounters:  11/05/21 206 lb (93.4 kg)  09/29/21 201 lb 7.2 oz (91.4 kg)  08/11/21 202 lb 3.2 oz (91.7 kg)   Body mass index is 33.25 kg/m.    Physical Exam Constitutional:      General: She is not in acute distress.    Appearance: Normal appearance. She is not ill-appearing.  HENT:     Head: Normocephalic and atraumatic.  Eyes:     Conjunctiva/sclera: Conjunctivae normal.  Cardiovascular:     Rate and Rhythm: Normal rate and regular rhythm.     Heart sounds: No murmur heard. Pulmonary:     Effort: Pulmonary effort is normal. No respiratory distress.     Breath sounds: Normal breath sounds. No wheezing or rales.  Skin:    General: Skin is warm and dry.     Findings: No rash.  Neurological:     General: No focal deficit present.     Mental Status: She is alert and oriented to person, place, and time.     Cranial Nerves: No  cranial nerve deficit.     Sensory: No sensory deficit.     Motor: No weakness.     Comments: Slight tremor bilateral hands when outstretched  Psychiatric:        Mood and Affect: Mood normal.            Assessment & Plan:    See Problem List for Assessment and Plan of chronic medical  problems.

## 2021-11-05 NOTE — Assessment & Plan Note (Signed)
Chronic Not on CPAP-not tolerated Has been referred to ENT for evaluation of inspire-stressed the importance of following up on this so that we can have her sleep apnea treated

## 2021-11-05 NOTE — Assessment & Plan Note (Signed)
Chronic Lab Results  Component Value Date   HGBA1C 6.2 04/30/2021   Sugars have been well controlled Continue metformin 500 mg twice daily Continue Victoza 1.8 mg daily injection She has been gaining weight-we will discuss this at her next visit We will check A1c at her next visit

## 2021-11-05 NOTE — Assessment & Plan Note (Addendum)
Chronic Fairly severe in nature Currently on multiple medications including Vicodin 5-325 mg-taking 1-1/2 pills nightly, amitriptyline 100 mg at night, Horizant 600 mg at bedtime, Lyrica 75 mg afternoon, 150 mg bedtime, Requip 0.25 mg bedtime Has been seeing sleep doctor in [place] who is also helping to manage Decrease amitriptyline to 50 mg at night-after 2 weeks stop medication and see how she does Follow-up in 5 weeks

## 2021-11-05 NOTE — Assessment & Plan Note (Signed)
Chronic Blood pressure well controlled Continue hydralazine 50 mg 3 times daily, losartan 100 mg daily, Spironolactone 12.5 mg daily

## 2021-11-05 NOTE — Patient Instructions (Addendum)
     MRI ordered of your brain.     Medications changes include :   start '81mg'$  of aspirin.   Decrease amitriptyline to one pill a night.      A referral was ordered for neurology.     Someone from that office will call you to schedule an appointment.    Return in about 5 weeks (around 12/10/2021) for routine follow up.

## 2021-11-10 ENCOUNTER — Other Ambulatory Visit: Payer: Self-pay | Admitting: Internal Medicine

## 2021-11-10 ENCOUNTER — Encounter: Payer: Self-pay | Admitting: Internal Medicine

## 2021-11-12 ENCOUNTER — Other Ambulatory Visit: Payer: Self-pay | Admitting: Internal Medicine

## 2021-11-12 DIAGNOSIS — M545 Low back pain, unspecified: Secondary | ICD-10-CM

## 2021-11-12 MED ORDER — HYDROCODONE-ACETAMINOPHEN 5-325 MG PO TABS: 1.0000 | ORAL_TABLET | Freq: Every day | ORAL | 0 refills | Status: DC

## 2021-11-17 DIAGNOSIS — G2581 Restless legs syndrome: Secondary | ICD-10-CM | POA: Diagnosis not present

## 2021-11-17 DIAGNOSIS — I951 Orthostatic hypotension: Secondary | ICD-10-CM | POA: Diagnosis not present

## 2021-11-17 DIAGNOSIS — M4800 Spinal stenosis, site unspecified: Secondary | ICD-10-CM | POA: Diagnosis not present

## 2021-11-17 DIAGNOSIS — R32 Unspecified urinary incontinence: Secondary | ICD-10-CM | POA: Diagnosis not present

## 2021-11-17 DIAGNOSIS — I10 Essential (primary) hypertension: Secondary | ICD-10-CM | POA: Diagnosis not present

## 2021-11-17 DIAGNOSIS — G4700 Insomnia, unspecified: Secondary | ICD-10-CM | POA: Diagnosis not present

## 2021-11-17 DIAGNOSIS — E1142 Type 2 diabetes mellitus with diabetic polyneuropathy: Secondary | ICD-10-CM | POA: Diagnosis not present

## 2021-11-17 DIAGNOSIS — E785 Hyperlipidemia, unspecified: Secondary | ICD-10-CM | POA: Diagnosis not present

## 2021-11-17 DIAGNOSIS — E669 Obesity, unspecified: Secondary | ICD-10-CM | POA: Diagnosis not present

## 2021-11-18 ENCOUNTER — Ambulatory Visit (INDEPENDENT_AMBULATORY_CARE_PROVIDER_SITE_OTHER): Payer: Medicare PPO

## 2021-11-18 ENCOUNTER — Ambulatory Visit (INDEPENDENT_AMBULATORY_CARE_PROVIDER_SITE_OTHER): Payer: Medicare PPO | Admitting: Emergency Medicine

## 2021-11-18 ENCOUNTER — Encounter: Payer: Self-pay | Admitting: Emergency Medicine

## 2021-11-18 VITALS — BP 126/74 | HR 103 | Temp 98.2°F | Ht 66.0 in | Wt 198.5 lb

## 2021-11-18 DIAGNOSIS — R809 Proteinuria, unspecified: Secondary | ICD-10-CM

## 2021-11-18 DIAGNOSIS — E1129 Type 2 diabetes mellitus with other diabetic kidney complication: Secondary | ICD-10-CM | POA: Diagnosis not present

## 2021-11-18 DIAGNOSIS — Z9989 Dependence on other enabling machines and devices: Secondary | ICD-10-CM

## 2021-11-18 DIAGNOSIS — I452 Bifascicular block: Secondary | ICD-10-CM

## 2021-11-18 DIAGNOSIS — I10 Essential (primary) hypertension: Secondary | ICD-10-CM | POA: Diagnosis not present

## 2021-11-18 DIAGNOSIS — G4733 Obstructive sleep apnea (adult) (pediatric): Secondary | ICD-10-CM

## 2021-11-18 DIAGNOSIS — R55 Syncope and collapse: Secondary | ICD-10-CM | POA: Diagnosis not present

## 2021-11-18 DIAGNOSIS — R42 Dizziness and giddiness: Secondary | ICD-10-CM

## 2021-11-18 DIAGNOSIS — R300 Dysuria: Secondary | ICD-10-CM | POA: Diagnosis not present

## 2021-11-18 LAB — COMPREHENSIVE METABOLIC PANEL
ALT: 25 U/L (ref 0–35)
AST: 21 U/L (ref 0–37)
Albumin: 4.3 g/dL (ref 3.5–5.2)
Alkaline Phosphatase: 67 U/L (ref 39–117)
BUN: 29 mg/dL — ABNORMAL HIGH (ref 6–23)
CO2: 28 mEq/L (ref 19–32)
Calcium: 9.6 mg/dL (ref 8.4–10.5)
Chloride: 103 mEq/L (ref 96–112)
Creatinine, Ser: 1.21 mg/dL — ABNORMAL HIGH (ref 0.40–1.20)
GFR: 42.82 mL/min — ABNORMAL LOW (ref >60.00)
Glucose, Bld: 73 mg/dL (ref 70–99)
Potassium: 5.5 mEq/L — ABNORMAL HIGH (ref 3.5–5.1)
Sodium: 138 mEq/L (ref 135–145)
Total Bilirubin: 0.3 mg/dL (ref 0.2–1.2)
Total Protein: 7 g/dL (ref 6.0–8.3)

## 2021-11-18 LAB — POC URINALSYSI DIPSTICK (AUTOMATED)
Bilirubin, UA: NEGATIVE
Blood, UA: NEGATIVE
Glucose, UA: NEGATIVE
Ketones, UA: NEGATIVE
Nitrite, UA: POSITIVE
Protein, UA: NEGATIVE
Spec Grav, UA: 1.015 (ref 1.010–1.025)
Urobilinogen, UA: 0.2 E.U./dL
pH, UA: 6 (ref 5.0–8.0)

## 2021-11-18 LAB — CBC WITH DIFFERENTIAL/PLATELET
Basophils Absolute: 0.1 10*3/uL (ref 0.0–0.1)
Basophils Relative: 0.7 % (ref 0.0–3.0)
Eosinophils Absolute: 0.7 10*3/uL (ref 0.0–0.7)
Eosinophils Relative: 7.8 % — ABNORMAL HIGH (ref 0.0–5.0)
HCT: 36.7 % (ref 36.0–46.0)
Hemoglobin: 12.3 g/dL (ref 12.0–15.0)
Lymphocytes Relative: 21.6 % (ref 12.0–46.0)
Lymphs Abs: 1.8 10*3/uL (ref 0.7–4.0)
MCHC: 33.5 g/dL (ref 30.0–36.0)
MCV: 89.3 fl (ref 78.0–100.0)
Monocytes Absolute: 0.8 10*3/uL (ref 0.1–1.0)
Monocytes Relative: 9.3 % (ref 3.0–12.0)
Neutro Abs: 5.1 10*3/uL (ref 1.4–7.7)
Neutrophils Relative %: 60.6 % (ref 43.0–77.0)
Platelets: 308 10*3/uL (ref 150.0–400.0)
RBC: 4.11 Mil/uL (ref 3.87–5.11)
RDW: 13.5 % (ref 11.5–15.5)
WBC: 8.5 10*3/uL (ref 4.0–10.5)

## 2021-11-18 NOTE — Progress Notes (Signed)
Erin Roach 81 y.o.   Chief Complaint  Patient presents with   Dizziness   Nausea    HISTORY OF PRESENT ILLNESS: Acute problem visit today.  Patient of Dr. Billey Gosling. This is a 81 y.o. female complaining of feeling lightheaded and dizzy for the past 2 weeks. Visiting nurse report from yesterday reviewed.  Found patient to have orthostatic hypotension and physical exam revealed crackles on the right lower base of the lungs.  Patient denies any flulike symptoms, fever, or chills.  Occasional dry cough.  Denies chest pain or difficulty breathing. History of hypertension, on multiple medications. Medication list reviewed with patient and husband. History of UTIs in the past. Scheduled for MRI of brain later this week due to TIA in the past. History of chronic pain.  No new medications. History of obstructive sleep apnea but unable to tolerate CPAP treatment. Recently had dose of bedtime amitriptyline reduced to 1 capsule. Has daytime somnolence and tiredness.  Does not sleep well.  HPI   Prior to Admission medications   Medication Sig Start Date End Date Taking? Authorizing Provider  amitriptyline (ELAVIL) 50 MG tablet Take 1 tablet (50 mg total) by mouth at bedtime. 09/29/19   Binnie Rail, MD  aspirin EC 81 MG tablet Take 1 tablet (81 mg total) by mouth daily. Swallow whole. 11/05/21   Binnie Rail, MD  Blood Glucose Monitoring Suppl (ACCU-CHEK GUIDE) w/Device KIT Use to check blood sugars daily 04/18/21   Binnie Rail, MD  ergocalciferol (VITAMIN D2) 1.25 MG (50000 UT) capsule Take 1 capsule (50,000 Units total) by mouth once a week. Take one tablet by mouth on Mondays 02/14/20   Binnie Rail, MD  ezetimibe (ZETIA) 10 MG tablet Take 1 tablet (10 mg total) by mouth daily. 01/14/21 09/29/21  Croitoru, Mihai, MD  furosemide (LASIX) 20 MG tablet Take 1 tablet (20 mg total) by mouth daily. 09/29/21   Croitoru, Mihai, MD  HORIZANT 600 MG TBCR Take 1 tablet by mouth at bedtime.   06/10/18   [provider]  hydrALAZINE (APRESOLINE) 50 MG tablet Take 1 tablet (50 mg total) by mouth 3 (three) times daily. 09/29/21 09/24/22  Croitoru, Mihai, MD  HYDROcodone-acetaminophen (NORCO/VICODIN) 5-325 MG tablet Take 1-2 tablets by mouth at bedtime. 11/12/21   Janith Lima, MD  losartan (COZAAR) 100 MG tablet TAKE ONE TABLET BY MOUTH DAILY 08/11/21   Binnie Rail, MD  metFORMIN (GLUCOPHAGE) 500 MG tablet Take 1 tablet (500 mg total) by mouth 2 (two) times daily with a meal. 09/02/21   Croitoru, Mihai, MD  pregabalin (LYRICA) 75 MG capsule TAKE ONE CAPSULE BY MOUTH EVERY AFTERNOON AND TAKE TWO CAPSULES BY MOUTH EVERY NIGHT AT BEDTIME 09/29/19   Burns, Claudina Lick, MD  rOPINIRole (REQUIP) 0.25 MG tablet TAKE ONE TO TWO TABLETS BY MOUTH EVERY NIGHT AT BEDTIME 09/04/21   Binnie Rail, MD  rosuvastatin (CRESTOR) 20 MG tablet Take 1 tablet (20 mg total) by mouth daily. 04/08/21 09/29/21  Croitoru, Mihai, MD  simvastatin (ZOCOR) 40 MG tablet TAKE ONE TABLET BY MOUTH EVERY NIGHT AT BEDTIME 11/10/21   Binnie Rail, MD  spironolactone (ALDACTONE) 25 MG tablet Take 0.5 tablets (12.5 mg total) by mouth daily. 10/02/21   Croitoru, Mihai, MD  TECHLITE PEN NEEDLES 32G X 4 MM MISC USE TWICE A DAY AS DIRECTED 04/16/21   Binnie Rail, MD  VICTOZA 18 MG/3ML SOPN DIAL AND INJECT SUBCUTANEOUSLY 1.8MG DAILY EVERY MORNING 09/15/21  Binnie Rail, MD    No Active Allergies  Patient Active Problem List   Diagnosis Date Noted   Left hand weakness 11/05/2021   Bilateral primary osteoarthritis of knee 07/23/2021   Episodic lightheadedness 04/30/2021   Pain in right knee 12/12/2020   Anemia 04/09/2020   Carotid arterial disease ([place]) 01/23/2019   RBBB (right bundle branch block with left anterior fascicular block)    Chest pain 01/22/2019   Weakness of both lower extremities 01/02/2019   Frequent falls 01/02/2019   OSA  06/15/2017   Diabetes ([place]) 03/02/2017   Herpes zoster without complication  63/84/5364   B12 nutritional deficiency 02/23/2017   Vitamin D deficiency 12/08/2016   Tubular adenoma of colon 04/29/2016   Essential hypertension 09/12/2015   Allergic rhinitis 07/17/2015   Insomnia 07/17/2015   Obese 04/10/2015   S/P left TKA 04/09/2015   Osteopenia 12/04/2012   Tremor 11/18/2009   RESTLESS LEG SYNDROME 02/11/2009   LEG EDEMA, BILATERAL 02/11/2009   Hyperlipidemia 10/30/2008   Urinary incontinence 10/30/2008   Skin cancer 10/30/2008   Asthma 11/21/2007   Venous (peripheral) insufficiency 11/22/2006    Past Medical History:  Diagnosis Date   Allergic rhinitis    Apnea    Arthritis    Asthma    very mild   Back pain    Bilateral lower extremity edema    Carotid artery disease ([place])    a. Carotid duplex 03/2016 - duplex was stable, 68-03% RICA, 2-12% LICA   Complication of anesthesia    post-op delirium   Endometrial polyp    Essential hypertension    GERD (gastroesophageal reflux disease)    Heart murmur    per pt   History of adenomatous polyp of colon    04-24-2016  tubular adenoma   History of squamous cell carcinoma excision    pilonidal area second excision 02-02-2002   HTN (hypertension)    Hyperlipidemia    Leg pain    OAB (overactive bladder)    RA (rheumatoid arthritis) ([facility])    RBBB (right bundle branch block with left anterior fascicular block)    RLS (restless legs syndrome)    Shortness of breath on exertion    SUI (stress urinary incontinence, female)    Venous insufficiency    legs   Vitamin D deficiency     Past Surgical History:  Procedure Laterality Date   APPENDECTOMY  07-02-2005   [name]   open   CARDIAC CATHETERIZATION  01-23-2009  dr Darnell Level brodie   normal coronary arteries and lvf   CARDIOVASCULAR STRESS TEST  03/31/2016   Low risk nuclear study w/ medium defect of mild severity in basal anterior and mid anterior location w/ no evidence ischemia or infarction/  normal LV function and wall motion,  nuclear  stress ef 71%   CATARACT EXTRACTION W/ INTRAOCULAR LENS IMPLANT Left 09/2016   COLONOSCOPY  last one 04-24-2016   HYSTEROSCOPY WITH D & C N/A 12/22/2016   Procedure: DILATATION AND CURETTAGE /HYSTEROSCOPY;  Surgeon: Dian Queen, MD;  Location: [facility];  Service: Gynecology;  Laterality: N/A;   IR US GUIDANCE  09/08/2016   POSTERIOR LUMBAR FUSION  02/ 2018    [facility] [place]   SKIN CANCER EXCISION     TONSILLECTOMY  child   TOTAL KNEE ARTHROPLASTY Left 04/09/2015   Procedure: LEFT TOTAL KNEE ARTHROPLASTY;  Surgeon: Paralee Cancel, MD;  Location: [facility];  Service: Orthopedics;  Laterality: Left;   TRANSTHORACIC  ECHOCARDIOGRAM  01/21/2009   mild LVH, ef 96-75%, grade 1 diastolic dysfunction   ULNAR NERVE TRANSPOSITION Left 12-20-2008  dr sypher   decompression and partial resection medial triceps fascia   WIDE LOCAL EXCISION PILONIDAL AREA  02-02-2002  [name]   recurrent squamous cell carcinoma in situ    Social History   Socioeconomic History   Marital status: Married    Spouse name: Ted   Number of children: 2   Years of education: Not on file   Highest education level: Not on file  Occupational History   Occupation: retired Pharmacist, hospital  Tobacco Use   Smoking status: Former    Packs/day: 0.50    Years: 10.00    Total pack years: 5.00    Types: Cigarettes    Quit date: 12/10/1973    Years since quitting: 47.9   Smokeless tobacco: Never  Vaping Use   Vaping Use: Never used  Substance and Sexual Activity   Alcohol use: Yes    Comment: occasional   Drug use: No   Sexual activity: Not on file  Other Topics Concern   Not on file  Social History Narrative   2 children ages 32,35   Social Determinants of Health   Financial Resource Strain: Low Risk  (06/15/2017)   Overall Financial Resource Strain (CARDIA)    Difficulty of Paying Living Expenses: Not hard at all  Food Insecurity: No Food Insecurity (06/15/2017)   Hunger Vital Sign     Worried About Running Out of Food in the Last Year: Never true    Ran Out of Food in the Last Year: Never true  Transportation Needs: No Transportation Needs (06/15/2017)   PRAPARE - Hydrologist (Medical): No    Lack of Transportation (Non-Medical): No  Physical Activity: Insufficiently Active (06/15/2017)   Exercise Vital Sign    Days of Exercise per Week: 3 days    Minutes of Exercise per Session: 40 min  Stress: No Stress Concern Present (06/15/2017)   [place]    Feeling of Stress : Only a little  Social Connections: Unknown (06/15/2017)   Social Connection and Isolation Panel [NHANES]    Frequency of Communication with Friends and Family: More than three times a week    Frequency of Social Gatherings with Friends and Family: More than three times a week    Attends Religious Services: Not on file    Active Member of Clubs or Organizations: Not on file    Attends Archivist Meetings: Not on file    Marital Status: Married  Intimate Partner Violence: Not At Risk (06/15/2017)   Humiliation, Afraid, Rape, and Kick questionnaire    Fear of Current or Ex-Partner: No    Emotionally Abused: No    Physically Abused: No    Sexually Abused: No    Family History  Problem Relation Age of Onset   Esophageal cancer Father    Heart disease Father        Unknown what kind   Hyperlipidemia Father    Hypertension Father    Cancer Father    Heart failure Father    Hyperlipidemia Mother    Hypertension Mother    CAD Sister        48 years younger than patient - had stent. Was told that her anorexia/bulimia may have played a role   Stroke Maternal Grandmother 66   Diabetes Maternal Grandmother  Heart attack Maternal Grandfather 93   COPD Maternal Grandfather    Heart attack Paternal Grandmother        71s   Diabetes Other        [place]     Review of Systems  Constitutional: Negative.   Negative for chills and fever.  HENT: Negative.  Negative for congestion and sore throat.   Eyes: Negative.   Respiratory: Negative.  Negative for cough and shortness of breath.   Cardiovascular: Negative.  Negative for chest pain and palpitations.  Gastrointestinal: Negative.  Negative for abdominal pain, blood in stool, diarrhea, melena, nausea and vomiting.  Genitourinary: Negative.  Negative for dysuria, frequency and hematuria.  Musculoskeletal: Negative.   Skin: Negative.  Negative for rash.  Neurological:  Positive for dizziness. Negative for speech change, focal weakness and headaches.  All other systems reviewed and are negative.   Today's Vitals   11/18/21 0837  BP: 126/74  Pulse: (!) 103  Temp: 98.2 F (36.8 C)  TempSrc: Oral  SpO2: 94%   There is no height or weight on file to calculate BMI. Wt Readings from Last 3 Encounters:  11/05/21 206 lb (93.4 kg)  09/29/21 201 lb 7.2 oz (91.4 kg)  08/11/21 202 lb 3.2 oz (91.7 kg)   Today's Vitals   11/18/21 0837  BP: 126/74  Pulse: (!) 103  Temp: 98.2 F (36.8 C)  TempSrc: Oral  SpO2: 94%  Weight: 198 lb 8 oz (90 kg)  Height: _0  (1.676 m)   Body mass index is 32.04 kg/m. Orthostatic VS for the past 24 hrs (Last 3 readings):  BP- Lying Pulse- Lying BP- Sitting Pulse- Sitting BP- Standing at 0 minutes Pulse- Standing at 0 minutes  11/18/21 0921 138/88 101 120/78 105 120/66 110   Results for orders placed or performed in visit on 11/18/21 (from the past 24 hour(s))  POCT Urinalysis Dipstick (Automated)     Status: Abnormal   Collection Time: 11/18/21  9:46 AM  Result Value Ref Range   Color, UA amber    Clarity, UA cloudy    Glucose, UA Negative Negative   Bilirubin, UA negative    Ketones, UA negative    Spec Grav, UA 1.015 1.010 - 1.025   Blood, UA negative    pH, UA 6.0 5.0 - 8.0   Protein, UA Negative Negative   Urobilinogen, UA 0.2 0.2 or 1.0 E.U./dL   Nitrite, UA positive    Leukocytes, UA Trace (A)  Negative   *Note: Due to a large number of results and/or encounters for the requested time period, some results have not been displayed. A complete set of results can be found in Results Review.   DG Chest 2 View  Result Date: 11/18/2021 CLINICAL DATA:  Dizziness near syncope EXAM: CHEST - 2 VIEW COMPARISON:  January 22, 2019 FINDINGS: The heart size and mediastinal contours are within normal limits. Low lung volumes. Linear scar at the left lower lobe region stable. No focal consolidation, pleural effusion or vascular congestion. Mild-to-moderate spondylosis with endplate sclerotic changes in the lower thoracic-upper lumbar spine. IMPRESSION: No active cardiopulmonary disease. Electronically Signed   By: Frazier Richards M.D.   On: 11/18/2021 09:16   EKG: Normal sinus rhythm with bifascicular block.  No acute ischemic changes.  Unchanged from EKG done in March 2023.  Physical Exam Vitals reviewed.  Constitutional:      Appearance: Normal appearance.  HENT:     Head: Normocephalic.     Right  Ear: Tympanic membrane, ear canal and external ear normal.     Left Ear: Tympanic membrane, ear canal and external ear normal.     Mouth/Throat:     Mouth: Mucous membranes are moist.     Pharynx: Oropharynx is clear.  Eyes:     Extraocular Movements: Extraocular movements intact.     Conjunctiva/sclera: Conjunctivae normal.     Pupils: Pupils are equal, round, and reactive to light.  Cardiovascular:     Rate and Rhythm: Normal rate and regular rhythm.     Pulses: Normal pulses.     Heart sounds: Normal heart sounds.  Pulmonary:     Effort: Pulmonary effort is normal.     Breath sounds: Normal breath sounds.  Abdominal:     General: There is no distension.     Palpations: Abdomen is soft.     Tenderness: There is no abdominal tenderness.  Musculoskeletal:        General: Normal range of motion.     Cervical back: No tenderness.     Right lower leg: Edema present.     Left lower leg: Edema  present.  Lymphadenopathy:     Cervical: No cervical adenopathy.  Skin:    General: Skin is warm and dry.     Capillary Refill: Capillary refill takes less than 2 seconds.  Neurological:     General: No focal deficit present.     Mental Status: She is alert and oriented to person, place, and time.  Psychiatric:        Mood and Affect: Mood normal.        Behavior: Behavior normal.      ASSESSMENT & PLAN: A total of 63 minutes was spent with the patient and counseling/coordination of care regarding preparing for this visit, review of available medical records and most recent office visit, review of multiple chronic medical problems and their management, review of all medications and changes made, differential diagnosis of dizziness and lightheadedness, review of chest x-ray report and urinalysis, need for blood work today, prognosis, documentation and need for follow-up.  Problem List Items Addressed This Visit       Cardiovascular and Mediastinum   Essential hypertension    Slightly orthostatic. Recommend to decrease Apresoline to 25 mg twice a day and monitor blood pressure readings at home daily for the next several weeks.      RBBB (right bundle branch block with left anterior fascicular block)     Respiratory   OSA     Presently untreated and contributing to symptoms.        Endocrine   Diabetes ([place])     Other   Lightheadedness - Primary    Dizziness of recent onset about 2 weeks ago.  However there is a history of episodic lightheadedness in the past. Medication list reviewed with patient.  No new medications however amitriptyline dose was reduced significantly recently. Differential diagnosis discussed. No flulike symptoms or signs of infection. Urine sent for culture.  Chest x-ray negative for pneumonia.  Negative physical findings.  Afebrile. Significant orthostasis found yesterday during home nurse visit.  However today's findings are not very significant.   Will lower dose of Apresoline to 25 mg twice a day.  Advised to monitor blood pressure readings daily for the next couple weeks and advised the office if abnormal findings. No clinical findings of GI blood loss. EKG unchanged from previous ones.  No chest pain or difficulty breathing. Blood work done today.  Will  rule out electrolyte imbalances or kidney problems. Well-controlled diabetes at home. Lab Results  Component Value Date   HGBA1C 6.2 04/30/2021   Neurologically intact.  No TIA or stroke symptoms.  Scheduled for brain MRI next Thursday. OSA not well controlled.  Not on CPAP treatment.  Unable to tolerate.  Contributing to symptoms.  Inquiring about inspire treatment.       Relevant Orders   POCT urinalysis dipstick   CBC with Differential/Platelet   Comprehensive metabolic panel   DG Chest 2 View (Completed)   Orthostatic vital signs   POCT Urinalysis Dipstick (Automated) (Completed)   CULTURE, URINE COMPREHENSIVE     Agustina Caroli, MD [facility]

## 2021-11-18 NOTE — Assessment & Plan Note (Addendum)
Dizziness of recent onset about 2 weeks ago.  However there is a history of episodic lightheadedness in the past. Medication list reviewed with patient.  No new medications however amitriptyline dose was reduced significantly recently. Differential diagnosis discussed. No flulike symptoms or signs of infection. Urine sent for culture.  Chest x-ray negative for pneumonia.  Negative physical findings.  Afebrile. Significant orthostasis found yesterday during home nurse visit.  However today's findings are not very significant.  Will lower dose of Apresoline to 25 mg twice a day.  Advised to monitor blood pressure readings daily for the next couple weeks and advised the office if abnormal findings. No clinical findings of GI blood loss. EKG unchanged from previous ones.  No chest pain or difficulty breathing. Blood work done today.  Will rule out electrolyte imbalances or kidney problems. Well-controlled diabetes at home. Lab Results  Component Value Date   HGBA1C 6.2 04/30/2021   Neurologically intact.  No TIA or stroke symptoms.  Scheduled for brain MRI next Thursday. OSA not well controlled.  Not on CPAP treatment.  Unable to tolerate.  Contributing to symptoms.  Inquiring about inspire treatment.

## 2021-11-18 NOTE — Assessment & Plan Note (Signed)
Slightly orthostatic. Recommend to decrease Apresoline to 25 mg twice a day and monitor blood pressure readings at home daily for the next several weeks.

## 2021-11-18 NOTE — Assessment & Plan Note (Signed)
Presently untreated and contributing to symptoms.

## 2021-11-18 NOTE — Patient Instructions (Signed)
Decrease dose of Apresoline to 25 mg twice a day Continue all other medications Follow-up with PCP Dr. Quay Burow in 1 to 2 weeks or when available  Dizziness Dizziness is a common problem. It makes you feel unsteady or light-headed. You may feel like you are about to pass out (faint). Dizziness can lead to getting hurt if you stumble or fall. Dizziness can be caused by many things, including: Medicines. Not having enough water in your body (dehydration). Illness. Follow these instructions at home: Eating and drinking  Drink enough fluid to keep your pee (urine) pale yellow. This helps to keep you from getting dehydrated. Try to drink more clear fluids, such as water. Do not drink alcohol. Limit how much caffeine you drink or eat, if your doctor tells you to do that. Limit how much salt (sodium) you drink or eat, if your doctor tells you to do that. Activity Erin Roach

## 2021-11-20 ENCOUNTER — Ambulatory Visit
Admission: RE | Admit: 2021-11-20 | Discharge: 2021-11-20 | Disposition: A | Discharge status: Home / Self Care | Payer: Medicare PPO | Source: Ambulatory Visit | Attending: Internal Medicine | Admitting: Internal Medicine

## 2021-11-20 DIAGNOSIS — R531 Weakness: Secondary | ICD-10-CM | POA: Diagnosis not present

## 2021-11-20 DIAGNOSIS — R200 Anesthesia of skin: Secondary | ICD-10-CM | POA: Diagnosis not present

## 2021-11-20 DIAGNOSIS — R29898 Other symptoms and signs involving the musculoskeletal system: Secondary | ICD-10-CM

## 2021-11-20 DIAGNOSIS — Z8673 Personal history of transient ischemic attack (TIA), and cerebral infarction without residual deficits: Secondary | ICD-10-CM | POA: Diagnosis not present

## 2021-11-21 ENCOUNTER — Other Ambulatory Visit: Payer: Self-pay | Admitting: Internal Medicine

## 2021-11-21 ENCOUNTER — Encounter: Payer: Self-pay | Admitting: Emergency Medicine

## 2021-11-21 ENCOUNTER — Encounter: Payer: Self-pay | Admitting: Internal Medicine

## 2021-11-21 ENCOUNTER — Other Ambulatory Visit: Payer: Self-pay | Admitting: Emergency Medicine

## 2021-11-21 LAB — CULTURE, URINE COMPREHENSIVE

## 2021-11-21 MED ORDER — CEFUROXIME AXETIL 500 MG PO TABS: 500.0000 mg | ORAL_TABLET | Freq: Two times a day (BID) | ORAL | 0 refills | Status: AC

## 2021-11-21 MED ORDER — PROMETHAZINE HCL 12.5 MG PO TABS: 12.5000 mg | ORAL_TABLET | Freq: Four times a day (QID) | ORAL | 0 refills | Status: DC | PRN

## 2021-11-21 NOTE — Progress Notes (Signed)
Outside notes received. Information abstracted. Notes sent to scan.  

## 2021-11-26 ENCOUNTER — Encounter: Payer: Self-pay | Admitting: Orthopaedic Surgery

## 2021-11-26 NOTE — Telephone Encounter (Signed)
Please call and make sure the MRI is for the left total knee replacement-have her bring any papers that she might have from Dr Alvan Dame to the office

## 2021-12-01 ENCOUNTER — Other Ambulatory Visit: Payer: Self-pay

## 2021-12-01 DIAGNOSIS — M170 Bilateral primary osteoarthritis of knee: Secondary | ICD-10-CM

## 2021-12-02 DIAGNOSIS — Z6831 Body mass index (BMI) 31.0-31.9, adult: Secondary | ICD-10-CM | POA: Diagnosis not present

## 2021-12-02 DIAGNOSIS — G4733 Obstructive sleep apnea (adult) (pediatric): Secondary | ICD-10-CM | POA: Diagnosis not present

## 2021-12-04 NOTE — Progress Notes (Unsigned)
Subjective:    Patient ID: Erin Roach, female    DOB: 04/06/1941, 81 y.o.   MRN: [id number]     HPI Jalacia is here for follow up.  She was here one month ago for left hand weakness, possible numbness. It lasted 20 minutes.    Needs neuro referral - [facility] or [facility] - ? O see  Was here 7/14 for dizziness - ? Orthostatic hypotension - hydralazine dec to 25 mg bid  Left hand weakness/numbness - possible TIA - MRI w/o acute abn.  Mild chronic small vessel ischemic changes.    Chronic - poor balance -   RLS - one month ago decreased amitriptyline to 50 mg HS and stopped it after 2 weeks at that dose.    Sleep apnea - saw Dr Redmond Baseman.  Will have endoscopy.      Weight gain - ? Snacking at night  DM - change victoz to ozempic, ounjaro  Tremor - referred to neuro   Has been out of requip --    You are taking the following medications for RLS: Amitriptyline Requip Horizant Lyrica hydrocodone  Medications and allergies reviewed with patient and updated if appropriate.  Current Outpatient Medications on File Prior to Visit  Medication Sig Dispense Refill   amitriptyline (ELAVIL) 50 MG tablet Take 1 tablet (50 mg total) by mouth at bedtime.     aspirin EC 81 MG tablet Take 1 tablet (81 mg total) by mouth daily. Swallow whole. 30 tablet 12   Blood Glucose Monitoring Suppl (ACCU-CHEK GUIDE) w/Device KIT Use to check blood sugars daily 1 kit 0   ergocalciferol (VITAMIN D2) 1.25 MG (50000 UT) capsule Take 1 capsule (50,000 Units total) by mouth once a week. Take one tablet by mouth on Mondays 12 capsule 0   furosemide (LASIX) 20 MG tablet Take 1 tablet (20 mg total) by mouth daily. 90 tablet 3   HORIZANT 600 MG TBCR Take 1 tablet by mouth at bedtime. Patient takes 1/2 at night (300 mg)     hydrALAZINE (APRESOLINE) 50 MG tablet Take 1 tablet (50 mg total) by mouth 3 (three) times daily. 270 tablet 3   HYDROcodone-acetaminophen (NORCO/VICODIN) 5-325 MG tablet Take 1-2 tablets by  mouth at bedtime. 60 tablet 0   losartan (COZAAR) 100 MG tablet TAKE ONE TABLET BY MOUTH DAILY 90 tablet 1   metFORMIN (GLUCOPHAGE) 500 MG tablet Take 1 tablet (500 mg total) by mouth 2 (two) times daily with a meal. 180 tablet 3   pregabalin (LYRICA) 75 MG capsule TAKE ONE CAPSULE BY MOUTH EVERY AFTERNOON AND TAKE TWO CAPSULES BY MOUTH EVERY NIGHT AT BEDTIME 270 capsule 0   rOPINIRole (REQUIP) 0.25 MG tablet TAKE ONE TO TWO TABLETS BY MOUTH EVERY NIGHT AT BEDTIME 180 tablet 1   TECHLITE PEN NEEDLES 32G X 4 MM MISC USE TWICE A DAY AS DIRECTED 100 each 2   VICTOZA 18 MG/3ML SOPN DIAL AND INJECT SUBCUTANEOUSLY 1.8MG DAILY EVERY MORNING 3 mL 3   ezetimibe (ZETIA) 10 MG tablet Take 1 tablet (10 mg total) by mouth daily. 90 tablet 3   rosuvastatin (CRESTOR) 20 MG tablet Take 1 tablet (20 mg total) by mouth daily. 90 tablet 3   spironolactone (ALDACTONE) 25 MG tablet Take 0.5 tablets (12.5 mg total) by mouth daily. (Patient not taking: Reported on 12/05/2021) 45 tablet 3   No current facility-administered medications on file prior to visit.     Review of Systems  Respiratory:  Negative for shortness of breath.   Cardiovascular:  Positive for leg swelling. Negative for chest pain and palpitations.  Neurological:  Positive for tremors.       Objective:   Vitals:   12/05/21 1124  BP: 110/74  Pulse: (!) 106  Temp: 98.1 F (36.7 C)  SpO2: 97%   BP Readings from Last 3 Encounters:  12/05/21 110/74  11/18/21 126/74  11/05/21 122/66   Wt Readings from Last 3 Encounters:  12/05/21 205 lb (93 kg)  11/18/21 198 lb 8 oz (90 kg)  11/05/21 206 lb (93.4 kg)   Body mass index is 33.09 kg/m.    Physical Exam     Lab Results  Component Value Date   WBC 8.5 11/18/2021   HGB 12.3 11/18/2021   HCT 36.7 11/18/2021   PLT 308.0 11/18/2021   GLUCOSE 73 11/18/2021   CHOL 137 09/29/2021   TRIG 166 (H) 09/29/2021   HDL 54 09/29/2021   LDLDIRECT 103.0 04/10/2020   LDLCALC 55 09/29/2021   ALT  25 11/18/2021   AST 21 11/18/2021   NA 138 11/18/2021   K 5.5 (H) 11/18/2021   CL 103 11/18/2021   CREATININE 1.21 (H) 11/18/2021   BUN 29 (H) 11/18/2021   CO2 28 11/18/2021   TSH 5.130 (H) 12/13/2018   INR 1.03 03/27/2015   HGBA1C 6.2 04/30/2021     Assessment & Plan:    See Problem List for Assessment and Plan of chronic medical problems.

## 2021-12-04 NOTE — Assessment & Plan Note (Addendum)
Episode of left hand weakness/numbness x 20 minutes in June '23 MRI w/o acute CVA Possible TIA On ASA 81 mg daily On zetia 10 mg daily, crestor 20 mg daily Blood pressure and sugars well controlled Check A1c, lipid panel Referral to neurology Echocardiogram and carotid ultrasound ordered

## 2021-12-04 NOTE — Patient Instructions (Addendum)
     Blood work was ordered.     Medications changes include :   ozempic instead of the [place] prescription(s) have been sent to your pharmacy.    A referral was ordered for neurology - Dr Tomi Likens.     Someone from that office will call you to schedule an appointment.    Return in about 3 months (around 03/07/2022) for follow up.    You are taking the following medications for RLS: Amitriptyline Reqiup Horizant Lyrica hydrocodone

## 2021-12-05 ENCOUNTER — Ambulatory Visit (INDEPENDENT_AMBULATORY_CARE_PROVIDER_SITE_OTHER): Payer: Medicare PPO | Admitting: Internal Medicine

## 2021-12-05 ENCOUNTER — Encounter: Payer: Self-pay | Admitting: Internal Medicine

## 2021-12-05 VITALS — BP 110/74 | HR 106 | Temp 98.1°F | Ht 66.0 in | Wt 205.0 lb

## 2021-12-05 DIAGNOSIS — R809 Proteinuria, unspecified: Secondary | ICD-10-CM

## 2021-12-05 DIAGNOSIS — R29898 Other symptoms and signs involving the musculoskeletal system: Secondary | ICD-10-CM | POA: Diagnosis not present

## 2021-12-05 DIAGNOSIS — E1129 Type 2 diabetes mellitus with other diabetic kidney complication: Secondary | ICD-10-CM | POA: Diagnosis not present

## 2021-12-05 DIAGNOSIS — G2581 Restless legs syndrome: Secondary | ICD-10-CM | POA: Diagnosis not present

## 2021-12-05 DIAGNOSIS — I10 Essential (primary) hypertension: Secondary | ICD-10-CM

## 2021-12-05 LAB — LIPID PANEL
Cholesterol: 169 mg/dL (ref 0–200)
HDL: 55.3 mg/dL (ref >39.00)
NonHDL: 113.3
Total CHOL/HDL Ratio: 3
Triglycerides: 214 mg/dL — ABNORMAL HIGH (ref 0.0–149.0)
VLDL: 42.8 mg/dL — ABNORMAL HIGH (ref 0.0–40.0)

## 2021-12-05 LAB — COMPREHENSIVE METABOLIC PANEL
ALT: 23 U/L (ref 0–35)
AST: 21 U/L (ref 0–37)
Albumin: 4.1 g/dL (ref 3.5–5.2)
Alkaline Phosphatase: 68 U/L (ref 39–117)
BUN: 29 mg/dL — ABNORMAL HIGH (ref 6–23)
CO2: 27 mEq/L (ref 19–32)
Calcium: 9.3 mg/dL (ref 8.4–10.5)
Chloride: 102 mEq/L (ref 96–112)
Creatinine, Ser: 1.17 mg/dL (ref 0.40–1.20)
GFR: 44.57 mL/min — ABNORMAL LOW (ref >60.00)
Glucose, Bld: 88 mg/dL (ref 70–99)
Potassium: 4.6 mEq/L (ref 3.5–5.1)
Sodium: 137 mEq/L (ref 135–145)
Total Bilirubin: 0.3 mg/dL (ref 0.2–1.2)
Total Protein: 6.9 g/dL (ref 6.0–8.3)

## 2021-12-05 LAB — MICROALBUMIN / CREATININE URINE RATIO
Creatinine,U: 48.2 mg/dL
Microalb Creat Ratio: 1.5 mg/g (ref 0.0–30.0)
Microalb, Ur: <0.7 mg/dL (ref 0.0–1.9)

## 2021-12-05 LAB — HEMOGLOBIN A1C: Hgb A1c MFr Bld: 6.1 % (ref 4.6–6.5)

## 2021-12-05 LAB — LDL CHOLESTEROL, DIRECT: Direct LDL: 84 mg/dL

## 2021-12-05 MED ORDER — SEMAGLUTIDE(0.25 OR 0.5MG/DOS) 2 MG/3ML ~~LOC~~ SOPN: PEN_INJECTOR | SUBCUTANEOUS | 0 refills | Status: DC

## 2021-12-05 MED ORDER — HYDRALAZINE HCL 50 MG PO TABS: 25.0000 mg | ORAL_TABLET | Freq: Three times a day (TID) | ORAL | 3 refills | Status: DC

## 2021-12-05 MED ORDER — FUROSEMIDE 20 MG PO TABS: 40.0000 mg | ORAL_TABLET | Freq: Every day | ORAL | 3 refills | Status: DC

## 2021-12-05 MED ORDER — ROSUVASTATIN CALCIUM 20 MG PO TABS: 20.0000 mg | ORAL_TABLET | Freq: Every day | ORAL | 3 refills | Status: DC

## 2021-12-06 ENCOUNTER — Encounter: Payer: Self-pay | Admitting: Internal Medicine

## 2021-12-06 NOTE — Assessment & Plan Note (Signed)
Chronic Blood pressure well controlled CMP Continue hydralazine 25 mg 3 times daily, losartan 100 mg daily, spironolactone 12.5 mg daily which she is taking Advise monitoring BP at home

## 2021-12-06 NOTE — Assessment & Plan Note (Signed)
Chronic Hopefully will improve when she has the inspire since she has not been able to tolerate CPAP On multiple medications which are likely affecting her balance Has good days and bad days with the RLS-sometimes during the day, sometimes at night On amitriptyline-dose recently decreased to 50 mg nightly-continue that dose On Horizant 600 mg nightly, Lyrica 75 mg nightly, hydrocodone-acetaminophen 5-325 mg 1-2 tab at bedtime Has been out of Requip and has not noticed a difference so will hold for now.  We can consider restarting this at some point if needed Advised her to try holding the Horizant or the Lyrica to see if she really needs both.  It would be ideal to decrease some of her medications

## 2021-12-06 NOTE — Assessment & Plan Note (Signed)
Chronic Sugars have been controlled Check A1c She is struggling with her weight-we will try changing Victoza to Ozempic to see if that helps a little bit more with her weight Continue metformin 500 mg twice daily Diabetic diet, regular exercise

## 2021-12-07 ENCOUNTER — Encounter: Payer: Self-pay | Admitting: Internal Medicine

## 2021-12-07 DIAGNOSIS — M545 Low back pain, unspecified: Secondary | ICD-10-CM

## 2021-12-09 ENCOUNTER — Encounter: Payer: Self-pay | Admitting: Internal Medicine

## 2021-12-09 MED ORDER — PREGABALIN 75 MG PO CAPS
ORAL_CAPSULE | ORAL | 0 refills | Status: DC
Start: 2021-12-09 — End: 2022-01-09

## 2021-12-09 MED ORDER — PREGABALIN 75 MG PO CAPS: ORAL_CAPSULE | ORAL | 0 refills | Status: DC

## 2021-12-09 MED ORDER — HYDROCODONE-ACETAMINOPHEN 5-325 MG PO TABS: 1.0000 | ORAL_TABLET | Freq: Every day | ORAL | 0 refills | Status: DC

## 2021-12-09 NOTE — Telephone Encounter (Signed)
Check [place] registry last filled 11/12/2021.Marland KitchenJohny Roach

## 2021-12-10 ENCOUNTER — Ambulatory Visit: Payer: Medicare PPO | Admitting: Internal Medicine

## 2021-12-10 ENCOUNTER — Encounter: Payer: Self-pay | Admitting: Orthopaedic Surgery

## 2021-12-10 ENCOUNTER — Ambulatory Visit: Payer: Medicare PPO | Admitting: Orthopaedic Surgery

## 2021-12-10 DIAGNOSIS — G8929 Other chronic pain: Secondary | ICD-10-CM

## 2021-12-10 DIAGNOSIS — Z9889 Other specified postprocedural states: Secondary | ICD-10-CM

## 2021-12-10 DIAGNOSIS — M25562 Pain in left knee: Secondary | ICD-10-CM | POA: Insufficient documentation

## 2021-12-10 DIAGNOSIS — Z96652 Presence of left artificial knee joint: Secondary | ICD-10-CM | POA: Insufficient documentation

## 2021-12-10 NOTE — Progress Notes (Signed)
Office Visit Note   Patient: Erin Roach           Date of Birth: 1941-03-01           MRN: [id number] Visit Date: 12/10/2021              Requested by: Erin Rail, MD [place],  Erin Roach [id number] PCP: Erin Rail, MD   Assessment & Plan: Visit Diagnoses:  1. Post-operative state   2. History of left knee replacement   3. Chronic pain of left knee     Plan: Erin Roach is a pleasant 81 year old woman who is 7 years status post left total knee arthroplasty performed by Dr. Alvan Dame.  She says she has considerable pain in the left knee which has been going on since the replacement.  It makes it difficult for her to do the activities she enjoys like walking.  This is progressively gotten worse over the years.  She denies any fever or chills.  She denies any redness in her knee.  She did call here for an evaluation and it was recommended to her by Dr. Alvan Dame to have an MRI of the knee.  This has been ordered but she has not yet had it.  She also has a history of diabetes and has had 2 level fusion of her lumbar spine at L4-5 and L5-S1.  She does not have any hip symptoms.  Her x-rays and her exam are reassuring.  She has good motion of her knee no redness or sign of infection and no warmth.  For now we will cancel the MRI of her left knee and order a three-phase bone scan as this will give more information regarding any loosening of the components.  There is a chance this may be coming from her back in which case if the bone scan is normal would consider an MRI of her lumbar spine.  She will follow-up once this is completed. Erin Roach had a three-phase bone scan of her left total knee replacement 2021 that did not demonstrate any particular abnormality.  There are a number of other possibilities for her knee still having some pain including her back and possibly diabetic neuropathy.  Follow-Up Instructions: After bone scan  Orders:  Orders Placed This Encounter  Procedures    NM Bone Scan 3 Phase   No orders of the defined types were placed in this encounter.     Procedures: No procedures performed   Clinical Data: No additional findings.   Subjective: Chief Complaint  Patient presents with   Left Knee - Follow-up   Patient presents today for follow up of her left knee pain. Patient states that pain has decreased and feels like this is because she has not been walking like regular. Patient mentions that she had a total left knee arthroplasty around 7 years ago and since then knee pain has never gotten better. She has tender to touch pains and sharp pains as well. OTC tylenol is being used to help manage her pain.   Review of Systems  All other systems reviewed and are negative.    Objective: Vital Signs: There were no vitals taken for this visit.  Physical Exam Constitutional:      Appearance: Normal appearance.  Pulmonary:     Effort: Pulmonary effort is normal.  Skin:    General: Skin is warm and dry.  Neurological:     General: No focal deficit present.  Mental Status: She is alert.     Ortho Exam Examination of her left knee.  She has no effusion she has no warmth she has no erythema.  Well-healed surgical incision.  She is able to sustain her leg in extension.  She does have full extension and flexion to 110 degrees.  This is not particularly painful to her.  No significant tenderness medially or laterally. Specialty Comments:  No specialty comments available.  Imaging: No results found.   PMFS History: Patient Active Problem List   Diagnosis Date Noted   History of left knee replacement 12/10/2021   Pain in left knee 12/10/2021   Left hand weakness 11/05/2021   Bilateral primary osteoarthritis of knee 07/23/2021   Lightheadedness 04/30/2021   Pain in right knee 12/12/2020   Anemia 04/09/2020   Carotid arterial disease ([facility]) 01/23/2019   RBBB (right bundle branch block with left anterior fascicular block)    Weakness  of both lower extremities 01/02/2019   Frequent falls 01/02/2019   OSA  06/15/2017   Diabetes ([place]) 03/02/2017   B12 nutritional deficiency 02/23/2017   Vitamin D deficiency 12/08/2016   Tubular adenoma of colon 04/29/2016   Essential hypertension 09/12/2015   Allergic rhinitis 07/17/2015   Insomnia 07/17/2015   Obese 04/10/2015   S/P left TKA 04/09/2015   Osteopenia 12/04/2012   Tremor 11/18/2009   RESTLESS LEG SYNDROME 02/11/2009   LEG EDEMA, BILATERAL 02/11/2009   Hyperlipidemia 10/30/2008   Urinary incontinence 10/30/2008   Skin cancer 10/30/2008   Asthma 11/21/2007   Venous (peripheral) insufficiency 11/22/2006   Past Medical History:  Diagnosis Date   Allergic rhinitis    Apnea    Arthritis    Asthma    very mild   Back pain    Bilateral lower extremity edema    Carotid artery disease ([place])    a. Carotid duplex 03/2016 - duplex was stable, 56-21% RICA, 3-08% LICA   Complication of anesthesia    post-op delirium   Endometrial polyp    Essential hypertension    GERD (gastroesophageal reflux disease)    Heart murmur    per pt   History of adenomatous polyp of colon    04-24-2016  tubular adenoma   History of squamous cell carcinoma excision    pilonidal area second excision 02-02-2002   HTN (hypertension)    Hyperlipidemia    Leg pain    OAB (overactive bladder)    RA (rheumatoid arthritis) ([facility])    RBBB (right bundle branch block with left anterior fascicular block)    RLS (restless legs syndrome)    Shortness of breath on exertion    SUI (stress urinary incontinence, female)    Venous insufficiency    legs   Vitamin D deficiency     Family History  Problem Relation Age of Onset   Esophageal cancer Father    Heart disease Father        Unknown what kind   Hyperlipidemia Father    Hypertension Father    Cancer Father    Heart failure Father    Hyperlipidemia Mother    Hypertension Mother    CAD Sister        98 years younger than patient - had  stent. Was told that her anorexia/bulimia may have played a role   Stroke Maternal Grandmother 66   Diabetes Maternal Grandmother    Heart attack Maternal Grandfather 93   COPD Maternal Grandfather    Heart attack Paternal Grandmother  70s   Diabetes Other        MGGM    Past Surgical History:  Procedure Laterality Date   APPENDECTOMY  07-02-2005   dr weatherly   open   CARDIAC CATHETERIZATION  01-23-2009  dr Darnell Level brodie   normal coronary arteries and lvf   CARDIOVASCULAR STRESS TEST  03/31/2016   Low risk nuclear study w/ medium defect of mild severity in basal anterior and mid anterior location w/ no evidence ischemia or infarction/  normal LV function and wall motion,  nuclear stress ef 71%   CATARACT EXTRACTION W/ INTRAOCULAR LENS IMPLANT Left 09/2016   COLONOSCOPY  last one 04-24-2016   HYSTEROSCOPY WITH D & C N/A 12/22/2016   Procedure: DILATATION AND CURETTAGE /HYSTEROSCOPY;  Surgeon: Dian Queen, MD;  Location: [facility];  Service: Gynecology;  Laterality: N/A;   IR US GUIDANCE  09/08/2016   POSTERIOR LUMBAR FUSION  02/ 2018    [facility] ([place])   SKIN CANCER EXCISION     TONSILLECTOMY  child   TOTAL KNEE ARTHROPLASTY Left 04/09/2015   Procedure: LEFT TOTAL KNEE ARTHROPLASTY;  Surgeon: Paralee Cancel, MD;  Location: [facility];  Service: Orthopedics;  Laterality: Left;   TRANSTHORACIC ECHOCARDIOGRAM  01/21/2009   mild LVH, ef 18-56%, grade 1 diastolic dysfunction   ULNAR NERVE TRANSPOSITION Left 12-20-2008  dr sypher   decompression and partial resection medial triceps fascia   WIDE LOCAL EXCISION PILONIDAL AREA  02-02-2002  [name]   recurrent squamous cell carcinoma in situ   Social History   Occupational History   Occupation: retired Pharmacist, hospital  Tobacco Use   Smoking status: Former    Packs/day: 0.50    Years: 10.00    Total pack years: 5.00    Types: Cigarettes    Quit date: 12/10/1973    Years since quitting: 48.0    Smokeless tobacco: Never  Vaping Use   Vaping Use: Never used  Substance and Sexual Activity   Alcohol use: Yes    Comment: occasional   Drug use: No   Sexual activity: Not on file

## 2021-12-15 ENCOUNTER — Encounter: Payer: Self-pay | Admitting: Internal Medicine

## 2021-12-17 ENCOUNTER — Encounter (INDEPENDENT_AMBULATORY_CARE_PROVIDER_SITE_OTHER): Payer: Self-pay

## 2021-12-17 ENCOUNTER — Other Ambulatory Visit (HOSPITAL_COMMUNITY): Payer: Medicare PPO

## 2021-12-19 ENCOUNTER — Other Ambulatory Visit: Payer: Self-pay | Admitting: Internal Medicine

## 2021-12-19 DIAGNOSIS — E119 Type 2 diabetes mellitus without complications: Secondary | ICD-10-CM

## 2021-12-22 ENCOUNTER — Encounter: Payer: Self-pay | Admitting: Orthopaedic Surgery

## 2021-12-22 ENCOUNTER — Other Ambulatory Visit: Payer: Self-pay | Admitting: Orthopaedic Surgery

## 2021-12-22 DIAGNOSIS — M170 Bilateral primary osteoarthritis of knee: Secondary | ICD-10-CM

## 2021-12-23 ENCOUNTER — Ambulatory Visit (HOSPITAL_COMMUNITY)
Admission: RE | Admit: 2021-12-23 | Payer: Medicare PPO | Source: Ambulatory Visit | Attending: Internal Medicine | Admitting: Internal Medicine

## 2021-12-23 DIAGNOSIS — L820 Inflamed seborrheic keratosis: Secondary | ICD-10-CM | POA: Diagnosis not present

## 2021-12-23 DIAGNOSIS — L821 Other seborrheic keratosis: Secondary | ICD-10-CM | POA: Diagnosis not present

## 2021-12-24 DIAGNOSIS — E119 Type 2 diabetes mellitus without complications: Secondary | ICD-10-CM | POA: Diagnosis not present

## 2021-12-24 DIAGNOSIS — H43813 Vitreous degeneration, bilateral: Secondary | ICD-10-CM | POA: Diagnosis not present

## 2021-12-24 DIAGNOSIS — Z961 Presence of intraocular lens: Secondary | ICD-10-CM | POA: Diagnosis not present

## 2021-12-24 DIAGNOSIS — H2511 Age-related nuclear cataract, right eye: Secondary | ICD-10-CM | POA: Diagnosis not present

## 2021-12-25 ENCOUNTER — Ambulatory Visit (HOSPITAL_COMMUNITY)
Admission: RE | Admit: 2021-12-25 | Discharge: 2021-12-25 | Disposition: A | Discharge status: Home / Self Care | Payer: Medicare PPO | Source: Ambulatory Visit | Attending: Internal Medicine | Admitting: Internal Medicine

## 2021-12-25 ENCOUNTER — Other Ambulatory Visit: Payer: Self-pay | Admitting: Orthopaedic Surgery

## 2021-12-25 DIAGNOSIS — Z9889 Other specified postprocedural states: Secondary | ICD-10-CM

## 2021-12-25 DIAGNOSIS — R29898 Other symptoms and signs involving the musculoskeletal system: Secondary | ICD-10-CM | POA: Diagnosis not present

## 2021-12-25 DIAGNOSIS — R42 Dizziness and giddiness: Secondary | ICD-10-CM

## 2021-12-25 LAB — HM DIABETES EYE EXAM

## 2021-12-26 ENCOUNTER — Ambulatory Visit (HOSPITAL_COMMUNITY): Payer: Medicare PPO | Attending: Cardiology

## 2021-12-26 DIAGNOSIS — R29898 Other symptoms and signs involving the musculoskeletal system: Secondary | ICD-10-CM | POA: Insufficient documentation

## 2021-12-26 DIAGNOSIS — I119 Hypertensive heart disease without heart failure: Secondary | ICD-10-CM | POA: Diagnosis not present

## 2021-12-26 DIAGNOSIS — E785 Hyperlipidemia, unspecified: Secondary | ICD-10-CM | POA: Insufficient documentation

## 2021-12-26 DIAGNOSIS — G4730 Sleep apnea, unspecified: Secondary | ICD-10-CM | POA: Insufficient documentation

## 2021-12-26 DIAGNOSIS — D649 Anemia, unspecified: Secondary | ICD-10-CM | POA: Diagnosis not present

## 2021-12-26 DIAGNOSIS — I4510 Unspecified right bundle-branch block: Secondary | ICD-10-CM | POA: Diagnosis not present

## 2021-12-26 DIAGNOSIS — I5032 Chronic diastolic (congestive) heart failure: Secondary | ICD-10-CM

## 2021-12-26 DIAGNOSIS — F17200 Nicotine dependence, unspecified, uncomplicated: Secondary | ICD-10-CM | POA: Insufficient documentation

## 2021-12-26 DIAGNOSIS — I872 Venous insufficiency (chronic) (peripheral): Secondary | ICD-10-CM | POA: Diagnosis not present

## 2021-12-26 LAB — ECHOCARDIOGRAM COMPLETE
Area-P 1/2: 4.15 cm2
MV VTI: 3.09 cm2
S' Lateral: 2.9 cm

## 2021-12-28 ENCOUNTER — Encounter: Payer: Self-pay | Admitting: Internal Medicine

## 2021-12-31 ENCOUNTER — Other Ambulatory Visit: Payer: Self-pay | Admitting: Otolaryngology

## 2022-01-01 ENCOUNTER — Encounter: Payer: Self-pay | Admitting: Internal Medicine

## 2022-01-01 NOTE — Progress Notes (Signed)
Outside notes received. Information abstracted. Notes sent to scan.  

## 2022-01-06 ENCOUNTER — Other Ambulatory Visit: Payer: Self-pay | Admitting: Orthopaedic Surgery

## 2022-01-06 DIAGNOSIS — Z9889 Other specified postprocedural states: Secondary | ICD-10-CM

## 2022-01-08 ENCOUNTER — Other Ambulatory Visit: Payer: Self-pay | Admitting: Cardiovascular Disease

## 2022-01-08 ENCOUNTER — Encounter: Payer: Self-pay | Admitting: Internal Medicine

## 2022-01-08 NOTE — Progress Notes (Signed)
Subjective:    Patient ID: Erin Roach, female    DOB: 1940/08/20, 81 y.o.   MRN: [id number]      HPI Alexandre is here for  Chief Complaint  Patient presents with   Tremors    Hands and arms. Seems to be getting worse.     BP at home - 115/64, 160/76 - most 125-150/62-77.  Some of the higher readings were when she was on vacation and likely eating a higher sodium diet   Tremors intermittent in hands and arms - occurs when reading a book, when drinking coffee.      It is worse with fatigue.  She did see Dr. Carles Collet years ago for this and at that time it was thought to be benign/essential tremor.  She is not interested in taking medication at this time.    No longer taking amitriptyline.  Staggering while walking due to extreme fatigue - no improvement after stopping amitriptyline.  Her husband agrees that her staggering her poor balance is really related to when she barely sleeps.  This morning her gait is off, but she only got 3 hours of sleep.  She did see Dr. Redmond Baseman and is being worked up for inspire.     Medications and allergies reviewed with patient and updated if appropriate.  Current Outpatient Medications on File Prior to Visit  Medication Sig Dispense Refill   amitriptyline (ELAVIL) 50 MG tablet Take 1 tablet (50 mg total) by mouth at bedtime.     aspirin EC 81 MG tablet Take 1 tablet (81 mg total) by mouth daily. Swallow whole. 30 tablet 12   Blood Glucose Monitoring Suppl (ACCU-CHEK GUIDE) w/Device KIT Use to check blood sugars daily 1 kit 0   ezetimibe (ZETIA) 10 MG tablet TAKE ONE TABLET BY MOUTH DAILY 90 tablet 3   furosemide (LASIX) 20 MG tablet Take 2 tablets (40 mg total) by mouth daily. 90 tablet 3   HORIZANT 600 MG TBCR Take 1 tablet by mouth at bedtime. Patient takes 1/2 at night (300 mg)     hydrALAZINE (APRESOLINE) 50 MG tablet Take 0.5 tablets (25 mg total) by mouth 3 (three) times daily. 270 tablet 3   HYDROcodone-acetaminophen (NORCO/VICODIN) 5-325 MG  tablet Take 1-2 tablets by mouth at bedtime. 60 tablet 0   losartan (COZAAR) 100 MG tablet TAKE ONE TABLET BY MOUTH DAILY 90 tablet 1   metFORMIN (GLUCOPHAGE) 500 MG tablet Take 1 tablet (500 mg total) by mouth 2 (two) times daily with a meal. 180 tablet 3   pramipexole (MIRAPEX) 0.25 MG tablet Oral for 90 Days     pregabalin (LYRICA) 75 MG capsule TAKE ONE CAPSULE BY MOUTH EVERY AFTERNOON AND TAKE TWO CAPSULES BY MOUTH EVERY NIGHT AT BEDTIME 270 capsule 0   rOPINIRole (REQUIP) 0.25 MG tablet TAKE ONE TO TWO TABLETS BY MOUTH EVERY NIGHT AT BEDTIME 180 tablet 1   rosuvastatin (CRESTOR) 20 MG tablet Take 1 tablet (20 mg total) by mouth daily. 90 tablet 3   Semaglutide,0.25 or 0.5MG/DOS, 2 MG/3ML SOPN Inject 0.25 mg into the skin once a week for 30 days, THEN 0.5 mg once a week. 3 mL 0   spironolactone (ALDACTONE) 25 MG tablet Take 0.5 tablets (12.5 mg total) by mouth daily. 45 tablet 3   TECHLITE PEN NEEDLES 32G X 4 MM MISC USE TWICE A DAY AS DIRECTED 100 each 2   No current facility-administered medications on file prior to visit.    Review of  Systems  Constitutional:  Positive for fatigue. Negative for fever.  Respiratory:  Positive for cough. Negative for shortness of breath and wheezing.   Cardiovascular:  Positive for palpitations (one episode) and leg swelling. Negative for chest pain.  Neurological:  Positive for light-headedness. Negative for weakness and headaches.       Objective:   Vitals:   01/09/22 1419  BP: 120/68  Pulse: 100  Temp: 98.6 F (37 C)  SpO2: 96%   BP Readings from Last 3 Encounters:  01/09/22 120/68  12/05/21 110/74  11/18/21 126/74   Wt Readings from Last 3 Encounters:  01/09/22 200 lb (90.7 kg)  12/05/21 205 lb (93 kg)  11/18/21 198 lb 8 oz (90 kg)   Body mass index is 32.28 kg/m.    Physical Exam Constitutional:      General: She is not in acute distress.    Appearance: Normal appearance.  HENT:     Head: Normocephalic and atraumatic.   Eyes:     Conjunctiva/sclera: Conjunctivae normal.  Cardiovascular:     Rate and Rhythm: Normal rate and regular rhythm.     Heart sounds: Normal heart sounds. No murmur heard. Pulmonary:     Effort: Pulmonary effort is normal. No respiratory distress.     Breath sounds: Normal breath sounds. No wheezing.  Musculoskeletal:     Cervical back: Neck supple.     Right lower leg: No edema.     Left lower leg: No edema.  Lymphadenopathy:     Cervical: No cervical adenopathy.  Skin:    General: Skin is warm and dry.     Findings: No rash.  Neurological:     Mental Status: She is alert. Mental status is at baseline.  Psychiatric:        Mood and Affect: Mood normal.        Behavior: Behavior normal.            Assessment & Plan:    See Problem List for Assessment and Plan of chronic medical problems.

## 2022-01-09 ENCOUNTER — Ambulatory Visit: Payer: Medicare PPO | Admitting: Internal Medicine

## 2022-01-09 VITALS — BP 120/68 | HR 100 | Temp 98.6°F | Wt 200.0 lb

## 2022-01-09 DIAGNOSIS — G4709 Other insomnia: Secondary | ICD-10-CM | POA: Diagnosis not present

## 2022-01-09 DIAGNOSIS — G2581 Restless legs syndrome: Secondary | ICD-10-CM

## 2022-01-09 DIAGNOSIS — G4700 Insomnia, unspecified: Secondary | ICD-10-CM | POA: Diagnosis not present

## 2022-01-09 DIAGNOSIS — R251 Tremor, unspecified: Secondary | ICD-10-CM

## 2022-01-09 DIAGNOSIS — I10 Essential (primary) hypertension: Secondary | ICD-10-CM | POA: Diagnosis not present

## 2022-01-09 MED ORDER — ESZOPICLONE 2 MG PO TABS: 2.0000 mg | ORAL_TABLET | Freq: Every evening | ORAL | 0 refills | Status: DC | PRN

## 2022-01-09 MED ORDER — PREGABALIN 75 MG PO CAPS: ORAL_CAPSULE | ORAL | 0 refills | Status: DC

## 2022-01-09 NOTE — Assessment & Plan Note (Signed)
Chronic Blood pressure well controlled here and mostly controlled at home Continue current medications-hydralazine 25 mg 3 times daily, losartan 100 mg daily, spironolactone 12.5 mg daily

## 2022-01-09 NOTE — Patient Instructions (Addendum)
      Medications changes include :   lunesta 2 mg at bedtime.  Try taking 1 pregabalin at night instead of two.      Your prescription(s) have been sent to your pharmacy.      Return in about 4 months (around 05/11/2022).

## 2022-01-09 NOTE — Assessment & Plan Note (Signed)
Chronic Likely multifactorial RLS contributes, I think she has some primary insomnia, OSA currently not treated likely contributes RLS seems to be somewhat controlled Being evaluated for inspire, which will hopefully help Overall she is having significant symptoms and negative impact on the quality of her life by getting little sleep and poor quality sleep therefore I think it is reasonable to start a sleep medication Trial of Lunesta 2 mg nightly-discussed side effects.  We also agreed that the benefits of it may outweigh the risks given in ED symptoms she has with not sleeping Hopefully inspire will help and she will need this long-term

## 2022-01-09 NOTE — Assessment & Plan Note (Signed)
Chronic Likely benign essential tremor-did see Dr. Carles Collet several years ago Is worse when she is fatigued Probably worse overall Does not want to start any medication at this time We will monitor

## 2022-01-09 NOTE — Assessment & Plan Note (Signed)
Chronic Sounds fairly controlled, but because her sleep is so bad it is hard to know She does use a restless leg mat which helps He has stopped amitriptyline Continue Requip 0.25 mg daily Continue horizontal 600 mg at bedtime Decrease Lyrica from 75 mg in the afternoon and 150 mg at night to 75 mg in the afternoon and 75 mg at night-she will see if there is any difference with this Hopefully inspire will help

## 2022-01-10 ENCOUNTER — Other Ambulatory Visit: Payer: Self-pay | Admitting: Internal Medicine

## 2022-01-10 DIAGNOSIS — R29898 Other symptoms and signs involving the musculoskeletal system: Secondary | ICD-10-CM

## 2022-01-10 DIAGNOSIS — R251 Tremor, unspecified: Secondary | ICD-10-CM

## 2022-01-12 ENCOUNTER — Other Ambulatory Visit: Payer: Self-pay | Admitting: Internal Medicine

## 2022-01-14 ENCOUNTER — Encounter: Payer: Self-pay | Admitting: Internal Medicine

## 2022-01-15 ENCOUNTER — Encounter: Payer: Self-pay | Admitting: Internal Medicine

## 2022-01-18 ENCOUNTER — Other Ambulatory Visit: Payer: Self-pay | Admitting: Internal Medicine

## 2022-01-18 DIAGNOSIS — E119 Type 2 diabetes mellitus without complications: Secondary | ICD-10-CM

## 2022-01-18 MED ORDER — ESZOPICLONE 2 MG PO TABS: 2.0000 mg | ORAL_TABLET | Freq: Every evening | ORAL | 0 refills | Status: DC | PRN

## 2022-01-20 ENCOUNTER — Encounter (HOSPITAL_COMMUNITY)
Admission: RE | Admit: 2022-01-20 | Discharge: 2022-01-20 | Disposition: A | Discharge status: Home / Self Care | Payer: Medicare PPO | Source: Ambulatory Visit | Attending: Orthopaedic Surgery | Admitting: Orthopaedic Surgery

## 2022-01-20 DIAGNOSIS — Z9889 Other specified postprocedural states: Secondary | ICD-10-CM | POA: Diagnosis not present

## 2022-01-20 DIAGNOSIS — M1711 Unilateral primary osteoarthritis, right knee: Secondary | ICD-10-CM | POA: Diagnosis not present

## 2022-01-20 DIAGNOSIS — Z96652 Presence of left artificial knee joint: Secondary | ICD-10-CM | POA: Diagnosis not present

## 2022-01-20 DIAGNOSIS — Z96642 Presence of left artificial hip joint: Secondary | ICD-10-CM | POA: Diagnosis not present

## 2022-01-20 MED ORDER — TECHNETIUM TC 99M MEDRONATE IV KIT
20.0000 | PACK | Freq: Once | INTRAVENOUS | Status: AC | PRN
  Administered 2022-01-20: 20 via INTRAVENOUS

## 2022-01-23 ENCOUNTER — Other Ambulatory Visit: Payer: Medicare PPO

## 2022-01-25 ENCOUNTER — Encounter: Payer: Self-pay | Admitting: Orthopaedic Surgery

## 2022-01-26 NOTE — Telephone Encounter (Signed)
Called pt regarding this. Please schedule.

## 2022-02-04 ENCOUNTER — Other Ambulatory Visit (HOSPITAL_COMMUNITY): Payer: Self-pay | Admitting: Internal Medicine

## 2022-02-04 ENCOUNTER — Other Ambulatory Visit: Payer: Self-pay | Admitting: Internal Medicine

## 2022-02-04 DIAGNOSIS — I779 Disorder of arteries and arterioles, unspecified: Secondary | ICD-10-CM

## 2022-02-05 ENCOUNTER — Encounter: Payer: Self-pay | Admitting: Orthopaedic Surgery

## 2022-02-05 ENCOUNTER — Telehealth: Payer: Self-pay

## 2022-02-05 ENCOUNTER — Encounter (HOSPITAL_BASED_OUTPATIENT_CLINIC_OR_DEPARTMENT_OTHER)
Admission: RE | Admit: 2022-02-05 | Discharge: 2022-02-05 | Disposition: A | Discharge status: Home / Self Care | Payer: Medicare PPO | Source: Ambulatory Visit | Attending: Otolaryngology | Admitting: Otolaryngology

## 2022-02-05 ENCOUNTER — Ambulatory Visit: Payer: Medicare PPO | Admitting: Orthopaedic Surgery

## 2022-02-05 ENCOUNTER — Other Ambulatory Visit: Payer: Self-pay

## 2022-02-05 ENCOUNTER — Encounter (HOSPITAL_BASED_OUTPATIENT_CLINIC_OR_DEPARTMENT_OTHER): Payer: Self-pay | Admitting: Otolaryngology

## 2022-02-05 DIAGNOSIS — M5442 Lumbago with sciatica, left side: Secondary | ICD-10-CM

## 2022-02-05 DIAGNOSIS — M25561 Pain in right knee: Secondary | ICD-10-CM

## 2022-02-05 DIAGNOSIS — Z01818 Encounter for other preprocedural examination: Secondary | ICD-10-CM | POA: Insufficient documentation

## 2022-02-05 DIAGNOSIS — R42 Dizziness and giddiness: Secondary | ICD-10-CM | POA: Insufficient documentation

## 2022-02-05 DIAGNOSIS — M25562 Pain in left knee: Secondary | ICD-10-CM

## 2022-02-05 DIAGNOSIS — E1129 Type 2 diabetes mellitus with other diabetic kidney complication: Secondary | ICD-10-CM | POA: Insufficient documentation

## 2022-02-05 DIAGNOSIS — M5441 Lumbago with sciatica, right side: Secondary | ICD-10-CM | POA: Diagnosis not present

## 2022-02-05 DIAGNOSIS — M545 Low back pain, unspecified: Secondary | ICD-10-CM | POA: Diagnosis not present

## 2022-02-05 DIAGNOSIS — G8929 Other chronic pain: Secondary | ICD-10-CM

## 2022-02-05 DIAGNOSIS — M170 Bilateral primary osteoarthritis of knee: Secondary | ICD-10-CM | POA: Diagnosis not present

## 2022-02-05 LAB — BASIC METABOLIC PANEL
Anion gap: 6 (ref 5–15)
BUN: 27 mg/dL — ABNORMAL HIGH (ref 8–23)
CO2: 22 mmol/L (ref 22–32)
Calcium: 8 mg/dL — ABNORMAL LOW (ref 8.9–10.3)
Chloride: 107 mmol/L (ref 98–111)
Creatinine, Ser: 1.41 mg/dL — ABNORMAL HIGH (ref 0.44–1.00)
GFR, Estimated: 38 mL/min — ABNORMAL LOW (ref >60)
Glucose, Bld: 108 mg/dL — ABNORMAL HIGH (ref 70–99)
Potassium: 4.8 mmol/L (ref 3.5–5.1)
Sodium: 135 mmol/L (ref 135–145)

## 2022-02-05 NOTE — Progress Notes (Signed)
Office Visit Note   Patient: Erin Roach           Date of Birth: [birth year]           MRN: [id number] Visit Date: 02/05/2022              Requested by: Erin Rail, MD [place],  [place] [address] PCP: Erin Rail, MD   Assessment & Plan: Visit Diagnoses:  1. Chronic low back pain, unspecified back pain laterality, unspecified whether sciatica present   2. Bilateral primary osteoarthritis of knee   3. Chronic pain of left knee   4. Chronic pain of right knee   5. Chronic bilateral low back pain with bilateral sciatica     Plan: [name] had a three-phase bone scan of her left total knee replacement as she continues to have some discomfort after several years post index total knee replacement.  She did have a three-phase bone scan several years ago that was negative for any evidence of loosening or infection.  The three-phase scan on this occasion was similar and that there was no evidence of any complication i.e. loosening or infection.  Alignment seems fine and I do not see any obvious complications by plain films.  The knee was not hot warm or red.  We have discussed several potential issues creating her pain that may or may not be referable to the knee including neuropathy from her diabetes and even possibly referred pain from her back.  She has had several back surgeries in [place].  I think it is worth obtaining an MRI scan of her lumbar spine to see if there is any possible etiology for her leg pain and also going to pre-CERT viscosupplementation of her right knee.  She is within several weeks of 6 months from her last 3 injections and has done exceptionally well and she would like to proceed with the same injections again.  This certainly is making a difference in preventing knee replacement on the right This patient is diagnosed with osteoarthritis of the knee(s).    Radiographs show evidence of joint space narrowing, osteophytes, subchondral sclerosis and/or  subchondral cysts.  This patient has knee pain which interferes with functional and activities of daily living.    This patient has experienced inadequate response, adverse effects and/or intolerance with conservative treatments such as acetaminophen, NSAIDS, topical creams, physical therapy or regular exercise, knee bracing and/or weight loss.   This patient has experienced inadequate response or has a contraindication to intra articular steroid injections for at least 3 months.   This patient is not scheduled to have a total knee replacement within 6 months of starting treatment with viscosupplementation.   Follow-Up Instructions: Return After MRI scan lumbar spine.   Orders:  Orders Placed This Encounter  Procedures   MR Lumbar Spine w/o contrast   No orders of the defined types were placed in this encounter.     Procedures: No procedures performed   Clinical Data: No additional findings.   Subjective: Chief Complaint  Patient presents with   Left Knee - Follow-up    Bone scan review  Patient presents today for follow up. She had a bone scan and is here to discuss those results.  HPI  Review of Systems   Objective: Vital Signs: There were no vitals taken for this visit.  Physical Exam Constitutional:      Appearance: She is well-developed.  Eyes:     Pupils: Pupils are  equal, round, and reactive to light.  Pulmonary:     Effort: Pulmonary effort is normal.  Skin:    General: Skin is warm and dry.  Neurological:     Mental Status: She is alert and oriented to person, place, and time.  Psychiatric:        Behavior: Behavior normal.     Ortho Exam awake alert and oriented x3.  Comfortable sitting in no acute distress.  Straight leg raise negative.  There is some altered sensibility feet related to the diabetic neuropathy.  The left knee was not hot warm or red.  She had full extension no instability in over about 100 degrees of flexion.  No localized areas  of tenderness.  No effusion.  Painless range of motion both hips minimal percussible tenderness of the lumbar spine.  There is arthritis in the right knee but was not uncomfortable as she has had viscosupplementation relates its made a big difference  Specialty Comments:  No specialty comments available.  Imaging: No results found.   PMFS History: Patient Active Problem List   Diagnosis Date Noted   Low back pain 02/05/2022   Pain in left knee 12/10/2021   Left hand weakness 11/05/2021   Bilateral primary osteoarthritis of knee 07/23/2021   Lightheadedness 04/30/2021   Pain in right knee 12/12/2020   Anemia 04/09/2020   Carotid arterial disease ([place]) - fu [name] due 12/2022 01/23/2019   RBBB (right bundle branch block with left anterior fascicular block)    Weakness of both lower extremities 01/02/2019   Frequent falls 01/02/2019   OSA  06/15/2017   Diabetes ([place]) 03/02/2017   B12 nutritional deficiency 02/23/2017   Vitamin D deficiency 12/08/2016   Tubular adenoma of colon 04/29/2016   Essential hypertension 09/12/2015   Allergic rhinitis 07/17/2015   Insomnia 07/17/2015   Obese 04/10/2015   S/P left TKA 04/09/2015   Osteopenia 12/04/2012   Tremor 11/18/2009   RESTLESS LEG SYNDROME 02/11/2009   LEG EDEMA, BILATERAL 02/11/2009   Hyperlipidemia 10/30/2008   Urinary incontinence 10/30/2008   Skin cancer 10/30/2008   Asthma 11/21/2007   Venous (peripheral) insufficiency 11/22/2006   Past Medical History:  Diagnosis Date   Allergic rhinitis    Apnea    Arthritis    Asthma    very mild   Back pain    Bilateral lower extremity edema    Carotid artery disease ([place])    a. Carotid duplex 03/2016 - duplex was stable, 14-43% RICA, 1-54% LICA   Complication of anesthesia    post-op delirium   Endometrial polyp    Essential hypertension    GERD (gastroesophageal reflux disease)    Heart murmur    per pt   History of adenomatous polyp of colon    04-24-2016  tubular adenoma    History of squamous cell carcinoma excision    pilonidal area second excision 02-02-2002   HTN (hypertension)    Hyperlipidemia    Leg pain    OAB (overactive bladder)    RA (rheumatoid arthritis) ([facility])    RBBB (right bundle branch block with left anterior fascicular block)    RLS (restless legs syndrome)    Shortness of breath on exertion    SUI (stress urinary incontinence, female)    Venous insufficiency    legs   Vitamin D deficiency     Family History  Problem Relation Age of Onset   Esophageal cancer Father    Heart disease Father  Unknown what kind   Hyperlipidemia Father    Hypertension Father    Cancer Father    Heart failure Father    Hyperlipidemia Mother    Hypertension Mother    CAD Sister        33 years younger than patient - had stent. Was told that her anorexia/bulimia may have played a role   Stroke Maternal Grandmother 66   Diabetes Maternal Grandmother    Heart attack Maternal Grandfather 93   COPD Maternal Grandfather    Heart attack Paternal Grandmother        70s   Diabetes Other        [place]    Past Surgical History:  Procedure Laterality Date   APPENDECTOMY  07-02-2005   dr weatherly   open   CARDIAC CATHETERIZATION  01-23-2009  dr Darnell Level brodie   normal coronary arteries and lvf   CARDIOVASCULAR STRESS TEST  03/31/2016   Low risk nuclear study w/ medium defect of mild severity in basal anterior and mid anterior location w/ no evidence ischemia or infarction/  normal LV function and wall motion,  nuclear stress ef 71%   CATARACT EXTRACTION W/ INTRAOCULAR LENS IMPLANT Left 09/2016   COLONOSCOPY  last one 04-24-2016   HYSTEROSCOPY WITH D & C N/A 12/22/2016   Procedure: DILATATION AND CURETTAGE /HYSTEROSCOPY;  Surgeon: Dian Queen, MD;  Location: [facility];  Service: Gynecology;  Laterality: N/A;   IR US GUIDANCE  09/08/2016   POSTERIOR LUMBAR FUSION  02/ 2018    [facility] ([place])   SKIN CANCER EXCISION      TONSILLECTOMY  child   TOTAL KNEE ARTHROPLASTY Left 04/09/2015   Procedure: LEFT TOTAL KNEE ARTHROPLASTY;  Surgeon: Paralee Cancel, MD;  Location: [facility];  Service: Orthopedics;  Laterality: Left;   TRANSTHORACIC ECHOCARDIOGRAM  01/21/2009   mild LVH, ef 27-51%, grade 1 diastolic dysfunction   ULNAR NERVE TRANSPOSITION Left 12-20-2008  dr sypher   decompression and partial resection medial triceps fascia   WIDE LOCAL EXCISION PILONIDAL AREA  02-02-2002  [name]   recurrent squamous cell carcinoma in situ   Social History   Occupational History   Occupation: retired Pharmacist, hospital  Tobacco Use   Smoking status: Former    Packs/day: 0.50    Years: 10.00    Total pack years: 5.00    Types: Cigarettes    Quit date: 12/10/1973    Years since quitting: 48.1   Smokeless tobacco: Never  Vaping Use   Vaping Use: Never used  Substance and Sexual Activity   Alcohol use: Yes    Comment: occasional   Drug use: No   Sexual activity: Not on file

## 2022-02-05 NOTE — Telephone Encounter (Signed)
Please precert for right knee gel injections. Dr.Whitfield's patient. Thanks

## 2022-02-08 ENCOUNTER — Other Ambulatory Visit: Payer: Self-pay | Admitting: Internal Medicine

## 2022-02-08 ENCOUNTER — Encounter: Payer: Self-pay | Admitting: Internal Medicine

## 2022-02-09 NOTE — Progress Notes (Signed)
Subjective:    Patient ID: Erin Roach, female    DOB: 1940-12-15, 81 y.o.   MRN: [id number]     HPI Erin Roach is here for follow up of her chronic medical problems, including restless leg syndrome, insomnia   Her RLS has gotten worse.  Having issues 3 times a day.  The relaxis pad does help.  Currently taking:  Horizant 600 mg at bedtime,  hydrocodone 5-325 1-2 tabs at bedtime Lyrica 75 mg in the afternoon and 75 mg at night Requip 0.50 mg at night   Lunesta 2 mg at night has not been working for sleep.  It did help, but the taste was not good.  ? Other options  Medications and allergies reviewed with patient and updated if appropriate.  Current Outpatient Medications on File Prior to Visit  Medication Sig Dispense Refill   aspirin EC 81 MG tablet Take 1 tablet (81 mg total) by mouth daily. Swallow whole. 30 tablet 12   eszopiclone (LUNESTA) 2 MG TABS tablet Take 1 tablet (2 mg total) by mouth at bedtime as needed for sleep. Take immediately before bedtime 30 tablet 0   ezetimibe (ZETIA) 10 MG tablet TAKE ONE TABLET BY MOUTH DAILY 90 tablet 3   furosemide (LASIX) 20 MG tablet Take 2 tablets (40 mg total) by mouth daily. 90 tablet 3   HORIZANT 600 MG TBCR Take 1 tablet by mouth at bedtime. Patient takes 1/2 at night (300 mg)     hydrALAZINE (APRESOLINE) 50 MG tablet Take 0.5 tablets (25 mg total) by mouth 3 (three) times daily. 270 tablet 3   HYDROcodone-acetaminophen (NORCO/VICODIN) 5-325 MG tablet Take 1-2 tablets by mouth at bedtime. 60 tablet 0   losartan (COZAAR) 100 MG tablet TAKE ONE TABLET BY MOUTH DAILY 90 tablet 1   metFORMIN (GLUCOPHAGE) 500 MG tablet TAKE 1 TABLET BY MOUTH TWICE A DAY WITH MEALS 180 tablet 1   OZEMPIC, 0.25 OR 0.5 MG/DOSE, 2 MG/3ML SOPN DIAL AND INJECT UNDER THE SKIN 0.25 MG WEEKLY FOR 4 WEEKS THEN DIAL AND INJECT 0.5 MG WEEKLY THEREAFTER 3 mL 0   pregabalin (LYRICA) 75 MG capsule TAKE ONE CAPSULE BY MOUTH EVERY AFTERNOON AND TAKE ONE CAPSULE BY  MOUTH EVERY NIGHT AT BEDTIME 270 capsule 0   rOPINIRole (REQUIP) 0.25 MG tablet TAKE ONE TO TWO TABLETS BY MOUTH EVERY NIGHT AT BEDTIME 180 tablet 1   rosuvastatin (CRESTOR) 20 MG tablet Take 1 tablet (20 mg total) by mouth daily. 90 tablet 3   simvastatin (ZOCOR) 40 MG tablet TAKE ONE TABLET BY MOUTH EVERY NIGHT AT BEDTIME 90 tablet 1   spironolactone (ALDACTONE) 25 MG tablet Take 0.5 tablets (12.5 mg total) by mouth daily. 45 tablet 3   No current facility-administered medications on file prior to visit.     Review of Systems     Objective:   Vitals:   02/10/22 1300  BP: 118/78  Pulse: 75  Temp: 98.4 F (36.9 C)  SpO2: 97%   BP Readings from Last 3 Encounters:  02/10/22 118/78  01/09/22 120/68  12/05/21 110/74   Wt Readings from Last 3 Encounters:  02/10/22 207 lb (93.9 kg)  01/09/22 200 lb (90.7 kg)  12/05/21 205 lb (93 kg)   Body mass index is 33.41 kg/m.    Physical Exam     Lab Results  Component Value Date   WBC 8.5 11/18/2021   HGB 12.3 11/18/2021   HCT 36.7 11/18/2021   PLT  308.0 11/18/2021   GLUCOSE 108 (H) 02/05/2022   CHOL 169 12/05/2021   TRIG 214.0 (H) 12/05/2021   HDL 55.30 12/05/2021   LDLDIRECT 84.0 12/05/2021   LDLCALC 55 09/29/2021   ALT 23 12/05/2021   AST 21 12/05/2021   NA 135 02/05/2022   K 4.8 02/05/2022   CL 107 02/05/2022   CREATININE 1.41 (H) 02/05/2022   BUN 27 (H) 02/05/2022   CO2 22 02/05/2022   TSH 5.130 (H) 12/13/2018   INR 1.03 03/27/2015   HGBA1C 6.1 12/05/2021   MICROALBUR <0.7 12/05/2021     Assessment & Plan:    See Problem List for Assessment and Plan of chronic medical problems.

## 2022-02-10 ENCOUNTER — Encounter: Payer: Self-pay | Admitting: Internal Medicine

## 2022-02-10 ENCOUNTER — Ambulatory Visit: Payer: Medicare PPO | Admitting: Internal Medicine

## 2022-02-10 VITALS — BP 118/78 | HR 75 | Temp 98.4°F | Ht 66.0 in | Wt 207.0 lb

## 2022-02-10 DIAGNOSIS — G4709 Other insomnia: Secondary | ICD-10-CM | POA: Diagnosis not present

## 2022-02-10 DIAGNOSIS — Z23 Encounter for immunization: Secondary | ICD-10-CM | POA: Diagnosis not present

## 2022-02-10 DIAGNOSIS — I10 Essential (primary) hypertension: Secondary | ICD-10-CM

## 2022-02-10 DIAGNOSIS — G2581 Restless legs syndrome: Secondary | ICD-10-CM | POA: Diagnosis not present

## 2022-02-10 MED ORDER — ROPINIROLE HCL 0.5 MG PO TABS: ORAL_TABLET | ORAL | 1 refills | Status: DC

## 2022-02-10 MED ORDER — ESZOPICLONE 2 MG PO TABS: 2.0000 mg | ORAL_TABLET | Freq: Every evening | ORAL | 5 refills | Status: DC | PRN

## 2022-02-10 MED ORDER — SEMAGLUTIDE (1 MG/DOSE) 4 MG/3ML ~~LOC~~ SOPN: 1.0000 mg | PEN_INJECTOR | SUBCUTANEOUS | 5 refills | Status: DC

## 2022-02-10 MED ORDER — PREGABALIN 75 MG PO CAPS: ORAL_CAPSULE | ORAL | 0 refills | Status: DC

## 2022-02-10 NOTE — Anesthesia Preprocedure Evaluation (Addendum)
Anesthesia Evaluation  Patient identified by MRN, date of birth, ID band Patient awake    Reviewed: Allergy & Precautions, H&P , NPO status , Patient's Chart, lab work & pertinent test results  History of Anesthesia Complications (+) history of anesthetic complications  Airway Mallampati: IV  TM Distance: >3 FB Neck ROM: Full    Dental no notable dental hx. (+) Teeth Intact, Caps, Dental Advisory Given   Pulmonary neg pulmonary ROS, asthma , sleep apnea , former smoker,    Pulmonary exam normal breath sounds clear to auscultation       Cardiovascular hypertension, Pt. on medications negative cardio ROS Normal cardiovascular exam Rhythm:Regular Rate:Normal     Neuro/Psych .Carotid duplex 03/2016 - duplex was stable, 71-24% RICA, 5-80% LICA  fu [name] due 01/9832 negative neurological ROS  negative psych ROS   GI/Hepatic negative GI ROS, Neg liver ROS, GERD  Medicated and Controlled,  Endo/Other  negative endocrine ROS  Renal/GU negative Renal ROS  negative genitourinary   Musculoskeletal negative musculoskeletal ROS (+) Arthritis , Osteoarthritis and Rheumatoid disorders,    Abdominal   Peds negative pediatric ROS (+)  Hematology negative hematology ROS (+)   Anesthesia Other Findings   Reproductive/Obstetrics negative OB ROS                            Anesthesia Physical  Anesthesia Plan  ASA: 3  Anesthesia Plan: MAC   Post-op Pain Management: Minimal or no pain anticipated   Induction: Intravenous  PONV Risk Score and Plan: 2 and Treatment may vary due to age or medical condition and Propofol infusion  Airway Management Planned: Natural Airway and Simple Face Mask  Additional Equipment: None  Intra-op Plan:   Post-operative Plan:   Informed Consent: I have reviewed the patients History and Physical, chart, labs and discussed the procedure including the risks, benefits and  alternatives for the proposed anesthesia with the patient or authorized representative who has indicated his/her understanding and acceptance.       Plan Discussed with: CRNA and Anesthesiologist  Anesthesia Plan Comments: (ECHO 8/18 1. LV cavitary gradient 12 mmHg at rest and 53 mmHg with Valsalva. Left  ventricular ejection fraction, by estimation, is 70 to 75%. The left  ventricle has hyperdynamic function. The left ventricle has no regional  wall motion abnormalities. There is  moderate concentric left ventricular hypertrophy. Left ventricular  diastolic parameters are indeterminate. The average left ventricular  global longitudinal strain is -24.5 %. The global longitudinal strain is  normal.  2. Right ventricular systolic function is normal. The right ventricular  size is normal. Tricuspid regurgitation signal is inadequate for assessing  PA pressure.  3. Left atrial size was mildly dilated.  4. The mitral valve is abnormal. Trivial mitral valve regurgitation. The  mean mitral valve gradient is 7.0 mmHg. Moderate to severe mitral annular  calcification.  5. The aortic valve was not well visualized. There is mild calcification  of the aortic valve. Aortic valve regurgitation is not visualized. Aortic  valve sclerosis is present, with no evidence of aortic valve stenosis.  6. The inferior vena cava is dilated in size with >50% respiratory  variability, suggesting right atrial pressure of 8 mmHg.   Comparison(s): No significant change from prior study. 08/04/21 EF 70-75%. )       Anesthesia Quick Evaluation

## 2022-02-10 NOTE — Telephone Encounter (Signed)
VOB submitted for Orthovisc, right knee  Next gel injection appt.needs to be after 03/06/2022.

## 2022-02-10 NOTE — Assessment & Plan Note (Signed)
Chronic Has been on amitriptyline and trazodone in the past Currently taking Horizant and Lyrica at night Lunesta 2 mg nightly does seem to help, but has a terrible taste Discussed there is not a lot of other options that would work well for her-I do not think Read Drivers would work well, would like to avoid amitriptyline or medications in the same family, but could try doxepin at some point.  I do not think Belsomra would work well and we would like to avoid Ambien Also given all of her other medications would like to avoid benzodiazepines We will continue Lunesta 2 mg nightly for now-refilled

## 2022-02-10 NOTE — Assessment & Plan Note (Signed)
Chronic BP well controlled Continue hydralazine 25 mg TID, losartan 100 mg daily, spironolactone 12.5 mg

## 2022-02-10 NOTE — Assessment & Plan Note (Signed)
Chronic Difficult to control Symptoms have gotten worse recently, which may be related to his decreasing the Lyrica at night Advised that she can go back to 150 mg of Lyrica at night We will also try increasing the Requip to 2 mg total-can adjust the dose depending on when she is symptomatic Continue present 600 mg at bedtime Lyrica 75 mg in afternoon and 150 mg at bedtime Increase Requip to 2 mg a day-can break up the dose as she feels needed-we will experiment Hydrocodone acetaminophen 5-325 mg -2 tabs at bedtime She will be getting inspire and hopefully by treating her sleep apnea that has not treated that will help her restless

## 2022-02-10 NOTE — Patient Instructions (Addendum)
     Flu immunization administered today.      Medications changes include :   increase ozempic to 1 mg weekly, increase requip up to 2 mg a day - divide it as needed.  Consider increasing lyrica to 2 tabs at night   Your prescription(s) have been sent to your pharmacy.     No follow-ups on file.

## 2022-02-11 ENCOUNTER — Other Ambulatory Visit: Payer: Self-pay

## 2022-02-11 ENCOUNTER — Ambulatory Visit (HOSPITAL_BASED_OUTPATIENT_CLINIC_OR_DEPARTMENT_OTHER)
Admission: RE | Admit: 2022-02-11 | Discharge: 2022-02-11 | Disposition: A | Discharge status: Home / Self Care | Payer: Medicare PPO | Attending: Otolaryngology | Admitting: Otolaryngology

## 2022-02-11 ENCOUNTER — Ambulatory Visit (HOSPITAL_BASED_OUTPATIENT_CLINIC_OR_DEPARTMENT_OTHER): Payer: Medicare PPO | Admitting: Anesthesiology

## 2022-02-11 ENCOUNTER — Encounter (HOSPITAL_BASED_OUTPATIENT_CLINIC_OR_DEPARTMENT_OTHER)
Admission: RE | Disposition: A | Discharge status: Home / Self Care | Payer: Self-pay | Source: Home / Self Care | Attending: Otolaryngology

## 2022-02-11 ENCOUNTER — Encounter (HOSPITAL_BASED_OUTPATIENT_CLINIC_OR_DEPARTMENT_OTHER): Payer: Self-pay | Admitting: Otolaryngology

## 2022-02-11 DIAGNOSIS — G4733 Obstructive sleep apnea (adult) (pediatric): Secondary | ICD-10-CM

## 2022-02-11 DIAGNOSIS — M1990 Unspecified osteoarthritis, unspecified site: Secondary | ICD-10-CM | POA: Insufficient documentation

## 2022-02-11 DIAGNOSIS — Z8249 Family history of ischemic heart disease and other diseases of the circulatory system: Secondary | ICD-10-CM | POA: Diagnosis not present

## 2022-02-11 DIAGNOSIS — I10 Essential (primary) hypertension: Secondary | ICD-10-CM

## 2022-02-11 DIAGNOSIS — Z79899 Other long term (current) drug therapy: Secondary | ICD-10-CM | POA: Insufficient documentation

## 2022-02-11 DIAGNOSIS — E669 Obesity, unspecified: Secondary | ICD-10-CM | POA: Insufficient documentation

## 2022-02-11 DIAGNOSIS — K219 Gastro-esophageal reflux disease without esophagitis: Secondary | ICD-10-CM | POA: Diagnosis not present

## 2022-02-11 DIAGNOSIS — M069 Rheumatoid arthritis, unspecified: Secondary | ICD-10-CM | POA: Insufficient documentation

## 2022-02-11 DIAGNOSIS — Z87891 Personal history of nicotine dependence: Secondary | ICD-10-CM | POA: Insufficient documentation

## 2022-02-11 DIAGNOSIS — J45909 Unspecified asthma, uncomplicated: Secondary | ICD-10-CM

## 2022-02-11 DIAGNOSIS — Z6833 Body mass index (BMI) 33.0-33.9, adult: Secondary | ICD-10-CM | POA: Diagnosis not present

## 2022-02-11 DIAGNOSIS — E1129 Type 2 diabetes mellitus with other diabetic kidney complication: Secondary | ICD-10-CM

## 2022-02-11 HISTORY — PX: DRUG INDUCED ENDOSCOPY: SHX6808

## 2022-02-11 LAB — GLUCOSE, CAPILLARY: Glucose-Capillary: 134 mg/dL — ABNORMAL HIGH (ref 70–99)

## 2022-02-11 SURGERY — DRUG INDUCED SLEEP ENDOSCOPY
Anesthesia: Monitor Anesthesia Care | Site: Nose | Laterality: Bilateral

## 2022-02-11 MED ORDER — LIDOCAINE-EPINEPHRINE 1 %-1:100000 IJ SOLN
INTRAMUSCULAR | Status: AC
  Filled 2022-02-11: qty 1

## 2022-02-11 MED ORDER — LIDOCAINE 2% (20 MG/ML) 5 ML SYRINGE
INTRAMUSCULAR | Status: DC | PRN
  Administered 2022-02-11: 30 mg via INTRAVENOUS

## 2022-02-11 MED ORDER — ONDANSETRON HCL 4 MG/2ML IJ SOLN
INTRAMUSCULAR | Status: DC | PRN
  Administered 2022-02-11: 4 mg via INTRAVENOUS

## 2022-02-11 MED ORDER — LACTATED RINGERS IV SOLN: INTRAVENOUS | Status: DC

## 2022-02-11 MED ORDER — PROPOFOL 500 MG/50ML IV EMUL
INTRAVENOUS | Status: DC | PRN
  Administered 2022-02-11: 30 ug/kg/min via INTRAVENOUS

## 2022-02-11 MED ORDER — ACETAMINOPHEN 160 MG/5ML PO SOLN: 325.0000 mg | ORAL | Status: DC | PRN

## 2022-02-11 MED ORDER — OXYCODONE HCL 5 MG/5ML PO SOLN: 5.0000 mg | Freq: Once | ORAL | Status: DC | PRN

## 2022-02-11 MED ORDER — MEPERIDINE HCL 25 MG/ML IJ SOLN: 6.2500 mg | INTRAMUSCULAR | Status: DC | PRN

## 2022-02-11 MED ORDER — FENTANYL CITRATE (PF) 100 MCG/2ML IJ SOLN: 25.0000 ug | INTRAMUSCULAR | Status: DC | PRN

## 2022-02-11 MED ORDER — OXYMETAZOLINE HCL 0.05 % NA SOLN
NASAL | Status: AC
  Filled 2022-02-11: qty 60

## 2022-02-11 MED ORDER — OXYMETAZOLINE HCL 0.05 % NA SOLN
NASAL | Status: DC | PRN
  Administered 2022-02-11: 1 via TOPICAL

## 2022-02-11 MED ORDER — ONDANSETRON HCL 4 MG/2ML IJ SOLN: 4.0000 mg | Freq: Once | INTRAMUSCULAR | Status: DC | PRN

## 2022-02-11 MED ORDER — ACETAMINOPHEN 325 MG PO TABS: 325.0000 mg | ORAL_TABLET | ORAL | Status: DC | PRN

## 2022-02-11 MED ORDER — OXYCODONE HCL 5 MG PO TABS: 5.0000 mg | ORAL_TABLET | Freq: Once | ORAL | Status: DC | PRN

## 2022-02-11 SURGICAL SUPPLY — 12 items
CANISTER SUCT 1200ML W/VALVE (MISCELLANEOUS) ×2 IMPLANT
GLOVE BIO SURGEON STRL SZ7.5 (GLOVE) ×2 IMPLANT
KIT CLEAN ENDO (MISCELLANEOUS) ×2 IMPLANT
NDL HYPO 27GX1-1/4 (NEEDLE) IMPLANT
NEEDLE HYPO 27GX1-1/4 (NEEDLE) IMPLANT
PATTIES SURGICAL .5 X3 (DISPOSABLE) ×2 IMPLANT
SHEET MEDIUM DRAPE 40X70 STRL (DRAPES) ×2 IMPLANT
SOL ANTI FOG 6CC (MISCELLANEOUS) ×2 IMPLANT
SOLUTION ANTI FOG 6CC (MISCELLANEOUS) ×1
SYR CONTROL 10ML LL (SYRINGE) IMPLANT
TOWEL GREEN STERILE FF (TOWEL DISPOSABLE) ×2 IMPLANT
TUBE CONNECTING 20X1/4 (TUBING) ×2 IMPLANT

## 2022-02-11 NOTE — Anesthesia Postprocedure Evaluation (Signed)
Anesthesia Post Note  Patient: Erin Roach  Procedure(s) Performed: DRUG INDUCED ENDOSCOPY (Bilateral: Nose)     Patient location during evaluation: PACU Anesthesia Type: MAC Level of consciousness: awake and alert Pain management: pain level controlled Vital Signs Assessment: post-procedure vital signs reviewed and stable Respiratory status: spontaneous breathing, nonlabored ventilation, respiratory function stable and patient connected to nasal cannula oxygen Cardiovascular status: stable and blood pressure returned to baseline Postop Assessment: no apparent nausea or vomiting Anesthetic complications: no   No notable events documented.  Last Vitals:  Vitals:   02/11/22 0900 02/11/22 0915  BP: 124/60 127/63  Pulse: 80   Resp: 11 16  Temp: 36.8 C   SpO2: 99% 96%    Last Pain:  Vitals:   02/11/22 0915  TempSrc:   PainSc: 0-No pain                 Emmarie Sannes

## 2022-02-11 NOTE — Op Note (Signed)
Preop diagnosis: Obstructive sleep apnea Postop diagnosis: same Procedure: Drug-induced sleep endoscopy Surgeon: Redmond Baseman Anesth: IV sedation Compl: None Findings: There is 50% anterior-posterior and 50% side wall collapse at the velum making her a candidate for hypoglossal nerve stimulator placement.  There was also mixed collapse at the tongue base. Description:  After discussing risks, benefits, and alternatives, the patient was brought to the operative suite and placed on the operative table in the supine position.  Anesthesia was induced and the patient was given light sedation to simulate natural sleep. When the proper level was reached, an Afrin-soaked pledget was placed in the right nasal passage for a couple of minutes and then removed.  The fiberoptic laryngoscope was then passed to view the pharynx and larynx.  Findings are noted above and the exam was recorded.  After completion, the scope was removed and the patient was returned to anesthesia for wakeup and was moved to the recovery room in stable condition.

## 2022-02-11 NOTE — H&P (Signed)
Erin Roach is an 81 y.o. female.   Chief Complaint: Sleep apnea HPI: 81 year old female with obstructive sleep apnea who has been unable to tolerate CPAP.  Past Medical History:  Diagnosis Date   Allergic rhinitis    Apnea    Arthritis    Asthma    very mild   Back pain    Bilateral lower extremity edema    Carotid artery disease (HCC)    a. Carotid duplex 03/2016 - duplex was stable, 34-19% RICA, 3-79% LICA   Complication of anesthesia    post-op delirium   Endometrial polyp    Essential hypertension    GERD (gastroesophageal reflux disease)    Heart murmur    per pt   History of adenomatous polyp of colon    04-24-2016  tubular adenoma   History of squamous cell carcinoma excision    pilonidal area second excision 02-02-2002   HTN (hypertension)    Hyperlipidemia    Leg pain    OAB (overactive bladder)    RA (rheumatoid arthritis) (HCC)    RBBB (right bundle branch block with left anterior fascicular block)    RLS (restless legs syndrome)    Shortness of breath on exertion    SUI (stress urinary incontinence, female)    Venous insufficiency    legs   Vitamin D deficiency     Past Surgical History:  Procedure Laterality Date   APPENDECTOMY  07-02-2005   [name]   open   CARDIAC CATHETERIZATION  01-23-2009  dr Darnell Level brodie   normal coronary arteries and lvf   CARDIOVASCULAR STRESS TEST  03/31/2016   Low risk nuclear study w/ medium defect of mild severity in basal anterior and mid anterior location w/ no evidence ischemia or infarction/  normal LV function and wall motion,  nuclear stress ef 71%   CATARACT EXTRACTION W/ INTRAOCULAR LENS IMPLANT Left 09/2016   COLONOSCOPY  last one 04-24-2016   HYSTEROSCOPY WITH D & C N/A 12/22/2016   Procedure: DILATATION AND CURETTAGE /HYSTEROSCOPY;  Surgeon: Dian Queen, MD;  Location: [place];  Service: Gynecology;  Laterality: N/A;   IR US GUIDANCE  09/08/2016   POSTERIOR LUMBAR FUSION  02/ 2018     [facility] ([place], [place])   SKIN CANCER EXCISION     TONSILLECTOMY  child   TOTAL KNEE ARTHROPLASTY Left 04/09/2015   Procedure: LEFT TOTAL KNEE ARTHROPLASTY;  Surgeon: Paralee Cancel, MD;  Location: [facility];  Service: Orthopedics;  Laterality: Left;   TRANSTHORACIC ECHOCARDIOGRAM  01/21/2009   mild LVH, ef 02-40%, grade 1 diastolic dysfunction   ULNAR NERVE TRANSPOSITION Left 12-20-2008  dr sypher   decompression and partial resection medial triceps fascia   WIDE LOCAL EXCISION PILONIDAL AREA  02-02-2002  [name]   recurrent squamous cell carcinoma in situ    Family History  Problem Relation Age of Onset   Esophageal cancer Father    Heart disease Father        Unknown what kind   Hyperlipidemia Father    Hypertension Father    Cancer Father    Heart failure Father    Hyperlipidemia Mother    Hypertension Mother    CAD Sister        46 years younger than patient - had stent. Was told that her anorexia/bulimia may have played a role   Stroke Maternal Grandmother 66   Diabetes Maternal Grandmother    Heart attack Maternal Grandfather 25  COPD Maternal Grandfather    Heart attack Paternal Grandmother        9s   Diabetes Other        [place]   Social History:  reports that she quit smoking about 48 years ago. Her smoking use included cigarettes. She has a 5.00 pack-year smoking history. She has never used smokeless tobacco. She reports current alcohol use. She reports that she does not use drugs.  Allergies: No Known Allergies  Medications Prior to Admission  Medication Sig Dispense Refill   aspirin EC 81 MG tablet Take 1 tablet (81 mg total) by mouth daily. Swallow whole. 30 tablet 12   eszopiclone (LUNESTA) 2 MG TABS tablet Take 1 tablet (2 mg total) by mouth at bedtime as needed for sleep. Take immediately before bedtime 30 tablet 5   ezetimibe (ZETIA) 10 MG tablet TAKE ONE TABLET BY MOUTH DAILY 90 tablet 3   furosemide (LASIX) 20 MG tablet Take 2 tablets (40 mg  total) by mouth daily. 90 tablet 3   HORIZANT 600 MG TBCR Take 1 tablet by mouth at bedtime. Patient takes 1/2 at night (300 mg)     hydrALAZINE (APRESOLINE) 50 MG tablet Take 0.5 tablets (25 mg total) by mouth 3 (three) times daily. 270 tablet 3   HYDROcodone-acetaminophen (NORCO/VICODIN) 5-325 MG tablet Take 1-2 tablets by mouth at bedtime. 60 tablet 0   losartan (COZAAR) 100 MG tablet TAKE ONE TABLET BY MOUTH DAILY 90 tablet 1   metFORMIN (GLUCOPHAGE) 500 MG tablet TAKE 1 TABLET BY MOUTH TWICE A DAY WITH MEALS 180 tablet 1   pregabalin (LYRICA) 75 MG capsule TAKE ONE CAPSULE BY MOUTH EVERY AFTERNOON AND TAKE TWO CAPSULES BY MOUTH EVERY NIGHT AT BEDTIME 270 capsule 0   rOPINIRole (REQUIP) 0.5 MG tablet Take 0.5 mg in the morning, 0.5 mg in the afternoon and 1 mg at bedtime 120 tablet 1   rosuvastatin (CRESTOR) 20 MG tablet Take 1 tablet (20 mg total) by mouth daily. 90 tablet 3   simvastatin (ZOCOR) 40 MG tablet TAKE ONE TABLET BY MOUTH EVERY NIGHT AT BEDTIME 90 tablet 1   spironolactone (ALDACTONE) 25 MG tablet Take 0.5 tablets (12.5 mg total) by mouth daily. 45 tablet 3   Semaglutide, 1 MG/DOSE, 4 MG/3ML SOPN Inject 1 mg as directed once a week. 3 mL 5    Results for orders placed or performed during the hospital encounter of 02/11/22 (from the past 48 hour(s))  Glucose, capillary     Status: Abnormal   Collection Time: 02/11/22  7:23 AM  Result Value Ref Range   Glucose-Capillary 134 (H) 70 - 99 mg/dL    Comment: Glucose reference range applies only to samples taken after fasting for at least 8 hours.   *Note: Due to a large number of results and/or encounters for the requested time period, some results have not been displayed. A complete set of results can be found in Results Review.   No results found.  Review of Systems  All other systems reviewed and are negative.   Blood pressure (!) 125/59, pulse 79, temperature 98.2 F (36.8 C), temperature source Oral, resp. rate 16, height  '5\' 6"'$  (1.676 m), weight 95.5 kg, SpO2 99 %. Physical Exam Constitutional:      Appearance: Normal appearance.  HENT:     Head: Normocephalic and atraumatic.     Right Ear: External ear normal.     Left Ear: External ear normal.     Nose: Nose normal.  Mouth/Throat:     Mouth: Mucous membranes are moist.     Pharynx: Oropharynx is clear.  Eyes:     Extraocular Movements: Extraocular movements intact.     Conjunctiva/sclera: Conjunctivae normal.     Pupils: Pupils are equal, round, and reactive to light.  Cardiovascular:     Rate and Rhythm: Tachycardia present.  Pulmonary:     Effort: Pulmonary effort is normal.  Musculoskeletal:     Cervical back: Normal range of motion.  Skin:    General: Skin is warm and dry.  Neurological:     General: No focal deficit present.     Mental Status: She is alert and oriented to person, place, and time. Mental status is at baseline.  Psychiatric:        Mood and Affect: Mood normal.        Behavior: Behavior normal.        Thought Content: Thought content normal.        Judgment: Judgment normal.      Assessment/Plan Obstructive sleep apnea, BMI 33.98  To OR sleep endoscopy.  Melida Quitter, MD 02/11/2022, 8:34 AM

## 2022-02-11 NOTE — Brief Op Note (Signed)
02/11/2022  8:51 AM  PATIENT:  Erin Roach  81 y.o. female  PRE-OPERATIVE DIAGNOSIS:  Obstructive Sleep Apnea BMI 31.0-31.9,adult  POST-OPERATIVE DIAGNOSIS:  same  PROCEDURE:  Procedure(s): DRUG INDUCED ENDOSCOPY (Bilateral)  SURGEON:  Surgeon(s) and Role:    Melida Quitter, MD - Primary  PHYSICIAN ASSISTANT:   ASSISTANTS: none   ANESTHESIA:   IV sedation  EBL:  None   BLOOD ADMINISTERED:none  DRAINS: none   LOCAL MEDICATIONS USED:  NONE  SPECIMEN:  No Specimen  DISPOSITION OF SPECIMEN:  N/A  COUNTS:  YES  TOURNIQUET:  * No tourniquets in log *  DICTATION: .Note written in EPIC  PLAN OF CARE: Discharge to home after PACU  PATIENT DISPOSITION:  PACU - hemodynamically stable.   Delay start of Pharmacological VTE agent (>24hrs) due to surgical blood loss or risk of bleeding: no

## 2022-02-11 NOTE — Transfer of Care (Signed)
Immediate Anesthesia Transfer of Care Note  Patient: Erin Roach  Procedure(s) Performed: DRUG INDUCED ENDOSCOPY (Bilateral: Nose)  Patient Location: PACU  Anesthesia Type:MAC  Level of Consciousness: awake, alert , oriented and patient cooperative  Airway & Oxygen Therapy: Patient Spontanous Breathing  Post-op Assessment: Report given to RN and Post -op Vital signs reviewed and stable  Post vital signs: Reviewed and stable  Last Vitals:  Vitals Value Taken Time  BP    Temp    Pulse    Resp    SpO2      Last Pain:  Vitals:   02/11/22 0721  TempSrc: Oral  PainSc: 0-No pain         Complications: No notable events documented.

## 2022-02-11 NOTE — Discharge Instructions (Signed)
  Post Anesthesia Home Care Instructions  Activity: Get plenty of rest for the remainder of the day. A responsible individual must stay with you for 24 hours following the procedure.  For the next 24 hours, DO NOT: -Drive a car -Operate machinery -Drink alcoholic beverages -Take any medication unless instructed by your physician -Make any legal decisions or sign important papers.  Meals: Start with liquid foods such as gelatin or soup. Progress to regular foods as tolerated. Avoid greasy, spicy, heavy foods. If nausea and/or vomiting occur, drink only clear liquids until the nausea and/or vomiting subsides. Call your physician if vomiting continues.  Special Instructions/Symptoms: Your throat may feel dry or sore from the anesthesia or the breathing tube placed in your throat during surgery. If this causes discomfort, gargle with warm salt water. The discomfort should disappear within 24 hours.  If you had a scopolamine patch placed behind your ear for the management of post- operative nausea and/or vomiting:  1. The medication in the patch is effective for 72 hours, after which it should be removed.  Wrap patch in a tissue and discard in the trash. Wash hands thoroughly with soap and water. 2. You may remove the patch earlier than 72 hours if you experience unpleasant side effects which may include dry mouth, dizziness or visual disturbances. 3. Avoid touching the patch. Wash your hands with soap and water after contact with the patch.    Post Anesthesia Home Care Instructions  Activity: Get plenty of rest for the remainder of the day. A responsible individual must stay with you for 24 hours following the procedure.  For the next 24 hours, DO NOT: -Drive a car -Operate machinery -Drink alcoholic beverages -Take any medication unless instructed by your physician -Make any legal decisions or sign important papers.  Meals: Start with liquid foods such as gelatin or soup. Progress to  regular foods as tolerated. Avoid greasy, spicy, heavy foods. If nausea and/or vomiting occur, drink only clear liquids until the nausea and/or vomiting subsides. Call your physician if vomiting continues.  Special Instructions/Symptoms: Your throat may feel dry or sore from the anesthesia or the breathing tube placed in your throat during surgery. If this causes discomfort, gargle with warm salt water. The discomfort should disappear within 24 hours.     

## 2022-02-11 NOTE — Anesthesia Procedure Notes (Signed)
Procedure Name: MAC Date/Time: 02/11/2022 8:47 AM  Performed by: Signe Colt, CRNAPre-anesthesia Checklist: Patient identified, Emergency Drugs available, Timeout performed, Patient being monitored and Suction available Patient Re-evaluated:Patient Re-evaluated prior to induction Oxygen Delivery Method: Simple face mask

## 2022-02-15 ENCOUNTER — Encounter (HOSPITAL_BASED_OUTPATIENT_CLINIC_OR_DEPARTMENT_OTHER): Payer: Self-pay | Admitting: Otolaryngology

## 2022-02-16 ENCOUNTER — Encounter: Payer: Self-pay | Admitting: Internal Medicine

## 2022-02-16 DIAGNOSIS — M545 Low back pain, unspecified: Secondary | ICD-10-CM

## 2022-02-16 MED ORDER — HYDROCODONE-ACETAMINOPHEN 5-325 MG PO TABS: 1.0000 | ORAL_TABLET | Freq: Every day | ORAL | 0 refills | Status: DC

## 2022-03-02 DIAGNOSIS — B351 Tinea unguium: Secondary | ICD-10-CM | POA: Diagnosis not present

## 2022-03-02 DIAGNOSIS — L03032 Cellulitis of left toe: Secondary | ICD-10-CM | POA: Diagnosis not present

## 2022-03-02 DIAGNOSIS — I739 Peripheral vascular disease, unspecified: Secondary | ICD-10-CM | POA: Diagnosis not present

## 2022-03-02 DIAGNOSIS — L603 Nail dystrophy: Secondary | ICD-10-CM | POA: Diagnosis not present

## 2022-03-05 ENCOUNTER — Other Ambulatory Visit: Payer: Self-pay | Admitting: Internal Medicine

## 2022-03-06 ENCOUNTER — Other Ambulatory Visit: Payer: Self-pay | Admitting: Otolaryngology

## 2022-03-09 ENCOUNTER — Ambulatory Visit: Payer: Medicare PPO | Admitting: Internal Medicine

## 2022-03-12 MED ORDER — HYDROCODONE-ACETAMINOPHEN 5-325 MG PO TABS: 1.0000 | ORAL_TABLET | Freq: Every day | ORAL | 0 refills | Status: DC

## 2022-03-12 NOTE — Addendum Note (Signed)
Addended by: Binnie Rail on: 03/12/2022 08:06 PM   Modules accepted: Orders

## 2022-03-23 DIAGNOSIS — M19071 Primary osteoarthritis, right ankle and foot: Secondary | ICD-10-CM | POA: Diagnosis not present

## 2022-03-23 DIAGNOSIS — I739 Peripheral vascular disease, unspecified: Secondary | ICD-10-CM | POA: Diagnosis not present

## 2022-03-23 DIAGNOSIS — M65871 Other synovitis and tenosynovitis, right ankle and foot: Secondary | ICD-10-CM | POA: Diagnosis not present

## 2022-03-23 DIAGNOSIS — M19072 Primary osteoarthritis, left ankle and foot: Secondary | ICD-10-CM | POA: Diagnosis not present

## 2022-03-23 DIAGNOSIS — L03032 Cellulitis of left toe: Secondary | ICD-10-CM | POA: Diagnosis not present

## 2022-03-23 DIAGNOSIS — B351 Tinea unguium: Secondary | ICD-10-CM | POA: Diagnosis not present

## 2022-03-23 DIAGNOSIS — L603 Nail dystrophy: Secondary | ICD-10-CM | POA: Diagnosis not present

## 2022-03-24 ENCOUNTER — Ambulatory Visit
Admission: RE | Admit: 2022-03-24 | Discharge: 2022-03-24 | Disposition: A | Discharge status: Home / Self Care | Payer: Medicare PPO | Source: Ambulatory Visit | Attending: Orthopaedic Surgery | Admitting: Orthopaedic Surgery

## 2022-03-24 DIAGNOSIS — M4316 Spondylolisthesis, lumbar region: Secondary | ICD-10-CM | POA: Diagnosis not present

## 2022-03-24 DIAGNOSIS — G8929 Other chronic pain: Secondary | ICD-10-CM

## 2022-03-24 DIAGNOSIS — M545 Low back pain, unspecified: Secondary | ICD-10-CM | POA: Diagnosis not present

## 2022-03-24 DIAGNOSIS — M48061 Spinal stenosis, lumbar region without neurogenic claudication: Secondary | ICD-10-CM | POA: Diagnosis not present

## 2022-04-08 ENCOUNTER — Telehealth: Payer: Self-pay

## 2022-04-08 DIAGNOSIS — M170 Bilateral primary osteoarthritis of knee: Secondary | ICD-10-CM

## 2022-04-08 NOTE — Telephone Encounter (Signed)
Called and left a Vm for patient to CB to schedule with Dr. Durward Fortes for gel injection.  Patient will need 3 appointments.  Check referral tab

## 2022-04-09 ENCOUNTER — Other Ambulatory Visit: Payer: Self-pay | Admitting: Internal Medicine

## 2022-04-09 ENCOUNTER — Telehealth: Payer: Self-pay | Admitting: Orthopaedic Surgery

## 2022-04-09 NOTE — Telephone Encounter (Signed)
Patient called in stating she has to reschedule her Gel injections being she has surgery scheduled on 04/21/22 unsure of who to schedule her with and when it needs to be done being they are scheduled with Durward Fortes and his last scheduled day is on the 4th, please advise, she does not mind if a PA does them

## 2022-04-10 ENCOUNTER — Encounter: Payer: Self-pay | Admitting: Internal Medicine

## 2022-04-14 ENCOUNTER — Encounter (HOSPITAL_BASED_OUTPATIENT_CLINIC_OR_DEPARTMENT_OTHER): Payer: Self-pay | Admitting: Otolaryngology

## 2022-04-14 ENCOUNTER — Other Ambulatory Visit: Payer: Self-pay

## 2022-04-14 NOTE — Progress Notes (Signed)
Message left for Angie at Dr. Iona Beard office regarding aspirin orders.

## 2022-04-14 NOTE — Progress Notes (Signed)
   04/14/22 1632  Pre-op Phone Call  Surgery Date Verified 04/21/22  Arrival Time Verified 7564  Surgery Location Verified [facility] [place]  Medical History Reviewed Yes  Is the patient taking a GLP-1 receptor agonist? Yes  Has the patient been informed on holding medication? Yes  Does the patient have diabetes? Type II  Does the patient use a Continuous Blood Glucose Monitor? No  Is the patient on an insulin pump? No  Has the diabetes coordinator been notified? No  Do you have a history of heart problems? Yes  Cardiologist Name Jackelyn Knife, MD  Have you ever had tests on your heart? Yes  What cardiac tests were performed? Echo  What date/year were cardiac tests completed? ECHO 12/26/21 EF 70-75%  Results viewable: [facility] Tab  Antiarrhythmic device type  (n/a)  Patient educated on enhanced recovery. Yes  Patient educated about smoking cessation 24 hours prior to surgery. N/A Non-Smoker  Patient verbalizes understanding of bowel prep? N/A  Med Rec Completed Yes  Take the Following Meds the Morning of Surgery hydralazine day of surgery; hold semaglutide 7 days prior to surgery; hold coenzyme q10 5 days prior to surgery; message left for Angie at Dr. Redmond Baseman office regarding aspirin orders  Recent  Lab Work, EKG, CXR? Yes  NPO (Including gum & candy) After midnight  Patient instructed to stop clear liquids including Carb loading drink at: 1030  Did patient view EMMI videos? No  Responsible adult to drive and be with you for 24 hours? Yes  Name & Phone Number for Ride/Caregiver Lynann Bologna (spouse)  No Jewelry, money, nail polish or make-up.  No lotions, powders, perfumes. No shaving  48 hrs. prior to surgery. Yes  Contacts, Dentures & Glasses Will Have to be Removed Before OR. Yes  Please bring your ID and Insurance Card the morning of your surgery. (Surgery Centers Only) Yes  Bring any papers or x-rays with you that your surgeon gave you. Yes  Instructed to contact the location of procedure/  provider if they or anyone in their household develops symptoms or tests positive for COVID-19, has close contact with someone who tests positive for COVID, or has known exposure to any contagious illness. Yes  Call this number the morning of surgery  with any problems that may cancel your surgery. [phone]  Covid-19 Assessment  Have you had a positive COVID-19 test within the previous 90 days? No  COVID Testing Guidance Proceed with the additional questions.  Patient's surgery required a COVID-19 test (cardiothoracic, complex ENT, and bronchoscopies/ EBUS) No

## 2022-04-15 ENCOUNTER — Ambulatory Visit: Payer: Medicare PPO | Admitting: Orthopaedic Surgery

## 2022-04-16 ENCOUNTER — Encounter (HOSPITAL_BASED_OUTPATIENT_CLINIC_OR_DEPARTMENT_OTHER)
Admission: RE | Admit: 2022-04-16 | Discharge: 2022-04-16 | Disposition: A | Discharge status: Home / Self Care | Payer: Medicare PPO | Source: Ambulatory Visit | Attending: Otolaryngology | Admitting: Otolaryngology

## 2022-04-16 ENCOUNTER — Telehealth: Payer: Self-pay | Admitting: Cardiovascular Disease

## 2022-04-16 ENCOUNTER — Ambulatory Visit: Payer: Medicare PPO | Attending: Cardiovascular Disease | Admitting: Nurse Practitioner

## 2022-04-16 ENCOUNTER — Telehealth: Payer: Self-pay

## 2022-04-16 ENCOUNTER — Encounter: Payer: Self-pay | Admitting: Internal Medicine

## 2022-04-16 DIAGNOSIS — Z01810 Encounter for preprocedural cardiovascular examination: Secondary | ICD-10-CM

## 2022-04-16 DIAGNOSIS — I10 Essential (primary) hypertension: Secondary | ICD-10-CM | POA: Diagnosis not present

## 2022-04-16 DIAGNOSIS — Z01812 Encounter for preprocedural laboratory examination: Secondary | ICD-10-CM | POA: Insufficient documentation

## 2022-04-16 DIAGNOSIS — M545 Low back pain, unspecified: Secondary | ICD-10-CM

## 2022-04-16 LAB — BASIC METABOLIC PANEL
Anion gap: 10 (ref 5–15)
BUN: 22 mg/dL (ref 8–23)
CO2: 20 mmol/L — ABNORMAL LOW (ref 22–32)
Calcium: 9.3 mg/dL (ref 8.9–10.3)
Chloride: 108 mmol/L (ref 98–111)
Creatinine, Ser: 1.38 mg/dL — ABNORMAL HIGH (ref 0.44–1.00)
GFR, Estimated: 39 mL/min — ABNORMAL LOW (ref >60)
Glucose, Bld: 108 mg/dL — ABNORMAL HIGH (ref 70–99)
Potassium: 5.2 mmol/L — ABNORMAL HIGH (ref 3.5–5.1)
Sodium: 138 mmol/L (ref 135–145)

## 2022-04-16 MED ORDER — HYDROCODONE-ACETAMINOPHEN 5-325 MG PO TABS: 1.0000 | ORAL_TABLET | Freq: Every day | ORAL | 0 refills | Status: DC

## 2022-04-16 NOTE — Telephone Encounter (Signed)
Patient scheduled for a telephone visit today at 3:00 PM. Consent given medications reviewed. Patient verbalized understanding all (if any) questions were answered.

## 2022-04-16 NOTE — Progress Notes (Signed)
Virtual Visit via Telephone Note   Because of Erin Roach's co-morbid illnesses, she is at least at moderate risk for complications without adequate follow up.  This format is felt to be most appropriate for this patient at this time.  The patient did not have access to video technology/had technical difficulties with video requiring transitioning to audio format only (telephone).  All issues noted in this document were discussed and addressed.  No physical exam could be performed with this format.  Please refer to the patient's chart for her consent to telehealth for [facility].  Evaluation Performed:  Preoperative cardiovascular risk assessment _____________   Date:  04/16/2022   Patient ID:  Erin Roach, DOB February 09, 1941, MRN [id number] Patient Location:  Home Provider location:   Office  Primary Care Provider:  Binnie Rail, MD Primary Cardiologist:  Sanda Klein, MD  Chief Complaint / Patient Profile   81 y.o. y/o female with a h/o bilateral carotid artery stenosis, bifascicular block, diastolic heart failure, hypertension, hyperlipidemia, type 2 diabetes, and OSA who is pending inspire implant on 04/21/2022 with Dr. Redmond Baseman and presents today for telephonic preoperative cardiovascular risk assessment.  History of Present Illness    Erin Roach is a 81 y.o. female who presents via audio/video conferencing for a telehealth visit today.  Pt was last seen in cardiology clinic on 09/29/2021 by Croitoru.  At that time Erin Roach was doing well.  The patient is now pending procedure as outlined above. Since her last visit, she has been stable from a cardiac standpoint. She notes stable chronic bilateral lower extremity edema.   She denies chest pain, palpitations, dyspnea, pnd, orthopnea, n, v, dizziness, syncope, worsening edema, weight gain, or early satiety. All other systems reviewed and are otherwise negative except as noted above.   Past Medical History     Past Medical History:  Diagnosis Date   Allergic rhinitis    Apnea    Arthritis    Asthma    very mild   Back pain    Bilateral lower extremity edema    Carotid artery disease (HCC)    a. Carotid duplex 03/2016 - duplex was stable, 97-02% RICA, 6-37% LICA   Complication of anesthesia    post-op delirium   Endometrial polyp    Essential hypertension    GERD (gastroesophageal reflux disease)    Heart murmur    per pt   History of adenomatous polyp of colon    04-24-2016  tubular adenoma   History of squamous cell carcinoma excision    pilonidal area second excision 02-02-2002   HTN (hypertension)    Hyperlipidemia    Leg pain    OAB (overactive bladder)    RA (rheumatoid arthritis) (HCC)    RBBB (right bundle branch block with left anterior fascicular block)    RLS (restless legs syndrome)    Shortness of breath on exertion    SUI (stress urinary incontinence, female)    Venous insufficiency    legs   Vitamin D deficiency    Past Surgical History:  Procedure Laterality Date   APPENDECTOMY  07-02-2005   [name]   open   CARDIAC CATHETERIZATION  01-23-2009  dr Darnell Level brodie   normal coronary arteries and lvf   CARDIOVASCULAR STRESS TEST  03/31/2016   Low risk nuclear study w/ medium defect of mild severity in basal anterior and mid anterior location w/ no evidence ischemia or infarction/  normal LV function and  wall motion,  nuclear stress ef 71%   CATARACT EXTRACTION W/ INTRAOCULAR LENS IMPLANT Left 09/2016   COLONOSCOPY  last one 04-24-2016   DRUG INDUCED ENDOSCOPY Bilateral 02/11/2022   Procedure: DRUG INDUCED ENDOSCOPY;  Surgeon: Melida Quitter, MD;  Location: [place];  Service: ENT;  Laterality: Bilateral;   HYSTEROSCOPY WITH D & C N/A 12/22/2016   Procedure: DILATATION AND CURETTAGE /HYSTEROSCOPY;  Surgeon: Dian Queen, MD;  Location: [place];  Service: Gynecology;  Laterality: N/A;   IR US GUIDANCE  09/08/2016    POSTERIOR LUMBAR FUSION  02/ 2018    [facility] ([place], [place])   SKIN CANCER EXCISION     TONSILLECTOMY  child   TOTAL KNEE ARTHROPLASTY Left 04/09/2015   Procedure: LEFT TOTAL KNEE ARTHROPLASTY;  Surgeon: Paralee Cancel, MD;  Location[facility];  Service: Orthopedics;  Laterality: Left;   TRANSTHORACIC ECHOCARDIOGRAM  01/21/2009   mild LVH, ef 71-24%, grade 1 diastolic dysfunction   ULNAR NERVE TRANSPOSITION Left 12-20-2008  dr sypher   decompression and partial resection medial triceps fascia   WIDE LOCAL EXCISION PILONIDAL AREA  02-02-2002  [name]   recurrent squamous cell carcinoma in situ    Allergies  No Known Allergies  Home Medications    Prior to Admission medications   Medication Sig Start Date End Date Taking? Authorizing Provider  aspirin EC 81 MG tablet Take 1 tablet (81 mg total) by mouth daily. Swallow whole. 11/05/21   Binnie Rail, MD  co-enzyme Q-10 30 MG capsule Take 30 mg by mouth 3 (three) times daily.    [provider]  eszopiclone (LUNESTA) 2 MG TABS tablet Take 1 tablet (2 mg total) by mouth at bedtime as needed for sleep. Take immediately before bedtime 02/10/22   Binnie Rail, MD  ezetimibe (ZETIA) 10 MG tablet TAKE ONE TABLET BY MOUTH DAILY 01/08/22   Croitoru, Dani Gobble, MD  furosemide (LASIX) 20 MG tablet Take 2 tablets (40 mg total) by mouth daily. 12/05/21   Burns, Claudina Lick, MD  HORIZANT 600 MG TBCR Take 1 tablet by mouth at bedtime. Patient takes 1/2 at night (300 mg) 06/10/18   [provider]  hydrALAZINE (APRESOLINE) 50 MG tablet Take 0.5 tablets (25 mg total) by mouth 3 (three) times daily. 12/05/21 11/25/23  Binnie Rail, MD  HYDROcodone-acetaminophen (NORCO/VICODIN) 5-325 MG tablet Take 1-2 tablets by mouth at bedtime. 03/12/22   Binnie Rail, MD  losartan (COZAAR) 100 MG tablet TAKE ONE TABLET BY MOUTH DAILY 02/04/22   Binnie Rail, MD  magnesium gluconate (MAGONATE) 500 MG tablet Take 500 mg by mouth 2 (two) times daily.     [provider]  metFORMIN (GLUCOPHAGE) 500 MG tablet TAKE 1 TABLET BY MOUTH TWICE A DAY WITH MEALS 01/19/22   Burns, Claudina Lick, MD  pregabalin (LYRICA) 75 MG capsule TAKE ONE CAPSULE BY MOUTH EVERY AFTERNOON AND TAKE TWO CAPSULES BY MOUTH EVERY NIGHT AT BEDTIME 03/05/22   Burns, Claudina Lick, MD  rOPINIRole (REQUIP) 0.5 MG tablet TAKE 1 TABLET BY MOUTH EVERY MORNING , 1 TABLET IN THE AFTERNOON , AND 2 TABLETS AT BEDTIME 04/09/22   Burns, Claudina Lick, MD  Semaglutide, 1 MG/DOSE, 4 MG/3ML SOPN Inject 1 mg as directed once a week. 02/10/22   Binnie Rail, MD  simvastatin (ZOCOR) 40 MG tablet TAKE ONE TABLET BY MOUTH EVERY NIGHT AT BEDTIME 02/04/22   Binnie Rail, MD    Physical Exam  Vital Signs:  SKYLYN SLEZAK does not have vital signs available for review today.  Given telephonic nature of communication, physical exam is limited. AAOx3. NAD. Normal affect.  Speech and respirations are unlabored.  Accessory Clinical Findings    None  Assessment & Plan    1.  Preoperative Cardiovascular Risk Assessment:  According to the Revised Cardiac Risk Index (RCRI), her Perioperative Risk of Major Cardiac Event is (%): 0.9. Her Functional Capacity in METs is: 6.45 according to the Duke Activity Status Index (DASI). Therefore, based on ACC/AHA guidelines, patient would be at acceptable risk for the planned procedure without further cardiovascular testing.   The patient was advised that if she develops new symptoms prior to surgery to contact our office to arrange for a follow-up visit, and she verbalized understanding.  Per office protocol, she may hold Aspirin for 5-7 days prior to procedure. Please resume Aspirin as soon as possible postprocedure, at the discretion of the surgeon.   A copy of this note will be routed to requesting surgeon.  Time:   Today, I have spent 4 minutes with the patient with telehealth technology discussing medical history, symptoms, and management plan.     Lenna Sciara, NP  04/16/2022, 3:08 PM

## 2022-04-16 NOTE — Telephone Encounter (Signed)
   Pre-operative Risk Assessment    Patient Name: Erin Roach  DOB: 06/29/1940 MRN: [id number]      Request for Surgical Clearance    Procedure:   Inspire Implant  Date of Surgery:  Clearance 04/21/22                                 Surgeon:  Dr. Redmond Baseman Surgeon's Group or Practice Name:  [facility] Phone number:  [phone]  Fax number:  [phone]   Type of Clearance Requested:   - Medical  - Pharmacy:  Hold Aspirin     Type of Anesthesia:  General    Additional requests/questions:   Caller stated they will need to know how long to hold the patient's Aspirin  Signed, Jasmin B Wilson   04/16/2022, 10:45 AM

## 2022-04-16 NOTE — Telephone Encounter (Signed)
  Patient Consent for Virtual Visit        Erin Roach has provided verbal consent on 04/16/2022 for a virtual visit (video or telephone).   CONSENT FOR VIRTUAL VISIT FOR:  Erin Roach  By participating in this virtual visit I agree to the following:  I hereby voluntarily request, consent and authorize [facility] and its employed or contracted physicians, physician assistants, nurse practitioners or other licensed health care professionals (the Practitioner), to provide me with telemedicine health care services (the "Services") as deemed necessary by the treating Practitioner. I acknowledge and consent to receive the Services by the Practitioner via telemedicine. I understand that the telemedicine visit will involve communicating with the Practitioner through live audiovisual communication technology and the disclosure of certain medical information by electronic transmission. I acknowledge that I have been given the opportunity to request an in-person assessment or other available alternative prior to the telemedicine visit and am voluntarily participating in the telemedicine visit.  I understand that I have the right to withhold or withdraw my consent to the use of telemedicine in the course of my care at any time, without affecting my right to future care or treatment, and that the Practitioner or I may terminate the telemedicine visit at any time. I understand that I have the right to inspect all information obtained and/or recorded in the course of the telemedicine visit and may receive copies of available information for a reasonable fee.  I understand that some of the potential risks of receiving the Services via telemedicine include:  Delay or interruption in medical evaluation due to technological equipment failure or disruption; Information transmitted may not be sufficient (e.g. poor resolution of images) to allow for appropriate medical decision making by the  Practitioner; and/or  In rare instances, security protocols could fail, causing a breach of personal health information.  Furthermore, I acknowledge that it is my responsibility to provide information about my medical history, conditions and care that is complete and accurate to the best of my ability. I acknowledge that Practitioner's advice, recommendations, and/or decision may be based on factors not within their control, such as incomplete or inaccurate data provided by me or distortions of diagnostic images or specimens that may result from electronic transmissions. I understand that the practice of medicine is not an exact science and that Practitioner makes no warranties or guarantees regarding treatment outcomes. I acknowledge that a copy of this consent can be made available to me via my patient portal ([facility]), or I can request a printed copy by calling the office of [facility].    I understand that my insurance will be billed for this visit.   I have read or had this consent read to me. I understand the contents of this consent, which adequately explains the benefits and risks of the Services being provided via telemedicine.  I have been provided ample opportunity to ask questions regarding this consent and the Services and have had my questions answered to my satisfaction. I give my informed consent for the services to be provided through the use of telemedicine in my medical care

## 2022-04-16 NOTE — Telephone Encounter (Signed)
   Name: Erin Roach  DOB: [birth year]  MRN: [id number]  Primary Cardiologist: Sanda Klein, MD   Preoperative team, please contact this patient and set up a phone call appointment for further preoperative risk assessment. Please obtain consent and complete medication review. Thank you for your help.  I confirm that guidance regarding antiplatelet and oral anticoagulation therapy has been completed and, if necessary, noted below.  Per office protocol, if patient is without any new symptoms or concerns at the time of their virtual visit, she may hold Aspirin for 5-7 days prior to procedure. Please resume Aspirin as soon as possible postprocedure, at the discretion of the surgeon.    Lenna Sciara, NP 04/16/2022, 10:53 AM [facility]

## 2022-04-17 NOTE — Progress Notes (Signed)
K+ 5.2, Dr. Glennon Mac aware, will proceed with surgery as scheduled.

## 2022-04-21 ENCOUNTER — Ambulatory Visit (HOSPITAL_BASED_OUTPATIENT_CLINIC_OR_DEPARTMENT_OTHER)
Admission: RE | Admit: 2022-04-21 | Discharge: 2022-04-21 | Disposition: A | Discharge status: Home / Self Care | Payer: Medicare PPO | Attending: Otolaryngology | Admitting: Otolaryngology

## 2022-04-21 ENCOUNTER — Other Ambulatory Visit: Payer: Self-pay

## 2022-04-21 ENCOUNTER — Ambulatory Visit (HOSPITAL_BASED_OUTPATIENT_CLINIC_OR_DEPARTMENT_OTHER): Payer: Medicare PPO | Admitting: Certified Registered"

## 2022-04-21 ENCOUNTER — Encounter (HOSPITAL_BASED_OUTPATIENT_CLINIC_OR_DEPARTMENT_OTHER): Payer: Self-pay | Admitting: Otolaryngology

## 2022-04-21 ENCOUNTER — Encounter (HOSPITAL_BASED_OUTPATIENT_CLINIC_OR_DEPARTMENT_OTHER)
Admission: RE | Disposition: A | Discharge status: Home / Self Care | Payer: Self-pay | Source: Home / Self Care | Attending: Otolaryngology

## 2022-04-21 ENCOUNTER — Ambulatory Visit (HOSPITAL_COMMUNITY): Payer: Medicare PPO

## 2022-04-21 DIAGNOSIS — Z7984 Long term (current) use of oral hypoglycemic drugs: Secondary | ICD-10-CM | POA: Insufficient documentation

## 2022-04-21 DIAGNOSIS — Z87891 Personal history of nicotine dependence: Secondary | ICD-10-CM

## 2022-04-21 DIAGNOSIS — J45909 Unspecified asthma, uncomplicated: Secondary | ICD-10-CM | POA: Insufficient documentation

## 2022-04-21 DIAGNOSIS — K219 Gastro-esophageal reflux disease without esophagitis: Secondary | ICD-10-CM | POA: Insufficient documentation

## 2022-04-21 DIAGNOSIS — I10 Essential (primary) hypertension: Secondary | ICD-10-CM | POA: Diagnosis not present

## 2022-04-21 DIAGNOSIS — Z79899 Other long term (current) drug therapy: Secondary | ICD-10-CM | POA: Insufficient documentation

## 2022-04-21 DIAGNOSIS — G4733 Obstructive sleep apnea (adult) (pediatric): Secondary | ICD-10-CM | POA: Insufficient documentation

## 2022-04-21 DIAGNOSIS — I119 Hypertensive heart disease without heart failure: Secondary | ICD-10-CM | POA: Diagnosis not present

## 2022-04-21 DIAGNOSIS — E119 Type 2 diabetes mellitus without complications: Secondary | ICD-10-CM | POA: Insufficient documentation

## 2022-04-21 DIAGNOSIS — G4730 Sleep apnea, unspecified: Secondary | ICD-10-CM | POA: Diagnosis not present

## 2022-04-21 DIAGNOSIS — Z6831 Body mass index (BMI) 31.0-31.9, adult: Secondary | ICD-10-CM | POA: Diagnosis not present

## 2022-04-21 DIAGNOSIS — M069 Rheumatoid arthritis, unspecified: Secondary | ICD-10-CM | POA: Diagnosis not present

## 2022-04-21 HISTORY — PX: IMPLANTATION OF HYPOGLOSSAL NERVE STIMULATOR: SHX6827

## 2022-04-21 LAB — GLUCOSE, CAPILLARY
Glucose-Capillary: 96 mg/dL (ref 70–99)
Glucose-Capillary: 127 mg/dL — ABNORMAL HIGH (ref 70–99)

## 2022-04-21 SURGERY — INSERTION, HYPOGLOSSAL NERVE STIMULATOR
Anesthesia: General | Site: Chest | Laterality: Right

## 2022-04-21 MED ORDER — FENTANYL CITRATE (PF) 100 MCG/2ML IJ SOLN
INTRAMUSCULAR | Status: DC | PRN
  Administered 2022-04-21 (×2): 50 ug via INTRAVENOUS

## 2022-04-21 MED ORDER — OXYCODONE HCL 5 MG/5ML PO SOLN: 5.0000 mg | Freq: Once | ORAL | Status: DC | PRN

## 2022-04-21 MED ORDER — AMISULPRIDE (ANTIEMETIC) 5 MG/2ML IV SOLN: 10.0000 mg | Freq: Once | INTRAVENOUS | Status: DC | PRN

## 2022-04-21 MED ORDER — ONDANSETRON HCL 4 MG/2ML IJ SOLN
INTRAMUSCULAR | Status: DC | PRN
  Administered 2022-04-21: 4 mg via INTRAVENOUS

## 2022-04-21 MED ORDER — CEFAZOLIN SODIUM-DEXTROSE 2-4 GM/100ML-% IV SOLN
2.0000 g | INTRAVENOUS | Status: AC
  Administered 2022-04-21: 2 g via INTRAVENOUS

## 2022-04-21 MED ORDER — PHENYLEPHRINE HCL-NACL 20-0.9 MG/250ML-% IV SOLN
INTRAVENOUS | Status: DC | PRN
  Administered 2022-04-21: 20 ug/min via INTRAVENOUS

## 2022-04-21 MED ORDER — HYDROCODONE-ACETAMINOPHEN 5-325 MG PO TABS: 1.0000 | ORAL_TABLET | Freq: Four times a day (QID) | ORAL | 0 refills | Status: DC | PRN

## 2022-04-21 MED ORDER — CEFAZOLIN SODIUM-DEXTROSE 2-4 GM/100ML-% IV SOLN
INTRAVENOUS | Status: AC
  Filled 2022-04-21: qty 100

## 2022-04-21 MED ORDER — DEXAMETHASONE SODIUM PHOSPHATE 4 MG/ML IJ SOLN
INTRAMUSCULAR | Status: DC | PRN
  Administered 2022-04-21: 4 mg via INTRAVENOUS

## 2022-04-21 MED ORDER — PROPOFOL 500 MG/50ML IV EMUL
INTRAVENOUS | Status: DC | PRN
  Administered 2022-04-21: 35 ug/kg/min via INTRAVENOUS

## 2022-04-21 MED ORDER — OXYCODONE HCL 5 MG PO TABS: 5.0000 mg | ORAL_TABLET | Freq: Once | ORAL | Status: DC | PRN

## 2022-04-21 MED ORDER — ONDANSETRON HCL 4 MG/2ML IJ SOLN: 4.0000 mg | Freq: Once | INTRAMUSCULAR | Status: DC | PRN

## 2022-04-21 MED ORDER — FENTANYL CITRATE (PF) 100 MCG/2ML IJ SOLN
INTRAMUSCULAR | Status: AC
  Filled 2022-04-21: qty 2

## 2022-04-21 MED ORDER — ACETAMINOPHEN 500 MG PO TABS
1000.0000 mg | ORAL_TABLET | Freq: Once | ORAL | Status: AC
  Administered 2022-04-21: 1000 mg via ORAL

## 2022-04-21 MED ORDER — LIDOCAINE 2% (20 MG/ML) 5 ML SYRINGE
INTRAMUSCULAR | Status: DC | PRN
  Administered 2022-04-21: 60 mg via INTRAVENOUS

## 2022-04-21 MED ORDER — 0.9 % SODIUM CHLORIDE (POUR BTL) OPTIME
TOPICAL | Status: DC | PRN
  Administered 2022-04-21: 1000 mL

## 2022-04-21 MED ORDER — SUCCINYLCHOLINE CHLORIDE 200 MG/10ML IV SOSY
PREFILLED_SYRINGE | INTRAVENOUS | Status: DC | PRN
  Administered 2022-04-21: 100 mg via INTRAVENOUS

## 2022-04-21 MED ORDER — PHENYLEPHRINE 80 MCG/ML (10ML) SYRINGE FOR IV PUSH (FOR BLOOD PRESSURE SUPPORT)
PREFILLED_SYRINGE | INTRAVENOUS | Status: AC
  Filled 2022-04-21: qty 20

## 2022-04-21 MED ORDER — LACTATED RINGERS IV SOLN: INTRAVENOUS | Status: DC

## 2022-04-21 MED ORDER — LIDOCAINE-EPINEPHRINE 1 %-1:100000 IJ SOLN
INTRAMUSCULAR | Status: DC | PRN
  Administered 2022-04-21: 5 mL

## 2022-04-21 MED ORDER — FENTANYL CITRATE (PF) 100 MCG/2ML IJ SOLN: 25.0000 ug | INTRAMUSCULAR | Status: DC | PRN

## 2022-04-21 MED ORDER — PROPOFOL 10 MG/ML IV BOLUS
INTRAVENOUS | Status: DC | PRN
  Administered 2022-04-21: 150 mg via INTRAVENOUS

## 2022-04-21 MED ORDER — ACETAMINOPHEN 500 MG PO TABS
ORAL_TABLET | ORAL | Status: AC
  Filled 2022-04-21: qty 2

## 2022-04-21 SURGICAL SUPPLY — 65 items
ACC NRSTM 4 TRQ WRNCH STRL (MISCELLANEOUS)
ADH SKN CLS APL DERMABOND .7 (GAUZE/BANDAGES/DRESSINGS) ×2
BLADE CLIPPER SURG (BLADE) IMPLANT
BLADE SURG 15 STRL LF DISP TIS (BLADE) ×2 IMPLANT
BLADE SURG 15 STRL SS (BLADE) ×1
CANISTER SUCT 1200ML W/VALVE (MISCELLANEOUS) ×2 IMPLANT
CORD BIPOLAR FORCEPS 12FT (ELECTRODE) ×2 IMPLANT
COVER PROBE CYLINDRICAL 5X96 (MISCELLANEOUS) ×2 IMPLANT
DERMABOND ADVANCED .7 DNX12 (GAUZE/BANDAGES/DRESSINGS) ×4 IMPLANT
DRAPE C-ARM 35X43 STRL (DRAPES) ×2 IMPLANT
DRAPE HEAD BAR (DRAPES) IMPLANT
DRAPE INCISE IOBAN 66X45 STRL (DRAPES) ×2 IMPLANT
DRAPE MICROSCOPE WILD 40.5X102 (DRAPES) ×2 IMPLANT
DRAPE UTILITY XL STRL (DRAPES) ×2 IMPLANT
DRSG TEGADERM 2-3/8X2-3/4 SM (GAUZE/BANDAGES/DRESSINGS) ×4 IMPLANT
DRSG TEGADERM 4X4.75 (GAUZE/BANDAGES/DRESSINGS) IMPLANT
ELECT COATED BLADE 2.86 ST (ELECTRODE) ×2 IMPLANT
ELECT EMG 18 NIMS (NEUROSURGERY SUPPLIES) ×1
ELECT REM PT RETURN 9FT ADLT (ELECTROSURGICAL) ×1
ELECTRODE EMG 18 NIMS (NEUROSURGERY SUPPLIES) ×2 IMPLANT
ELECTRODE REM PT RTRN 9FT ADLT (ELECTROSURGICAL) ×2 IMPLANT
FORCEPS BIPOLAR SPETZLER 8 1.0 (NEUROSURGERY SUPPLIES) ×2 IMPLANT
GAUZE 4X4 16PLY ~~LOC~~+RFID DBL (SPONGE) ×2 IMPLANT
GAUZE SPONGE 4X4 12PLY STRL (GAUZE/BANDAGES/DRESSINGS) ×2 IMPLANT
GENERATOR PULSE INSPIRE (Generator) ×1 IMPLANT
GENERATOR PULSE INSPIRE IV (Generator) ×2 IMPLANT
GLOVE BIO SURGEON STRL SZ 6.5 (GLOVE) IMPLANT
GLOVE BIO SURGEON STRL SZ7.5 (GLOVE) ×2 IMPLANT
GOWN STRL REUS W/ TWL LRG LVL3 (GOWN DISPOSABLE) ×6 IMPLANT
GOWN STRL REUS W/TWL LRG LVL3 (GOWN DISPOSABLE) ×3
IV CATH 18G SAFETY (IV SOLUTION) ×2 IMPLANT
KIT NEURO ACCESSORY W/WRENCH (MISCELLANEOUS) IMPLANT
LEAD SENSING RESP INSPIRE (Lead) ×1 IMPLANT
LEAD SENSING RESP INSPIRE IV (Lead) ×2 IMPLANT
LEAD SLEEP STIM INSPIRE IV/V (Lead) ×2 IMPLANT
LEAD SLEEP STIMULATION INSPIRE (Lead) ×1 IMPLANT
LOOP VASCULAR MINI 18 RED (MISCELLANEOUS) ×1
LOOP VESSEL MAXI BLUE (MISCELLANEOUS) ×2 IMPLANT
MARKER SKIN DUAL TIP RULER LAB (MISCELLANEOUS) ×2 IMPLANT
NDL HYPO 25X1 1.5 SAFETY (NEEDLE) ×2 IMPLANT
NEEDLE HYPO 25X1 1.5 SAFETY (NEEDLE) ×1 IMPLANT
NS IRRIG 1000ML POUR BTL (IV SOLUTION) ×2 IMPLANT
PACK BASIN DAY SURGERY FS (CUSTOM PROCEDURE TRAY) ×2 IMPLANT
PACK ENT DAY SURGERY (CUSTOM PROCEDURE TRAY) ×2 IMPLANT
PASSER CATH 36 CODMAN DISP (NEUROSURGERY SUPPLIES) IMPLANT
PASSER CATH 38CM DISP (INSTRUMENTS) IMPLANT
PENCIL SMOKE EVACUATOR (MISCELLANEOUS) ×2 IMPLANT
PROBE NERVE STIMULATOR (NEUROSURGERY SUPPLIES) ×2 IMPLANT
REMOTE CONTROL SLEEP INSPIRE (MISCELLANEOUS) ×2 IMPLANT
SET WALTER ACTIVATION W/DRAPE (SET/KITS/TRAYS/PACK) ×2 IMPLANT
SLEEVE SCD COMPRESS KNEE MED (STOCKING) ×2 IMPLANT
SPONGE INTESTINAL PEANUT (DISPOSABLE) ×2 IMPLANT
SUT SILK 2 0 SH (SUTURE) ×2 IMPLANT
SUT SILK 3 0 REEL (SUTURE) ×2 IMPLANT
SUT SILK 3 0 SH 30 (SUTURE) ×2 IMPLANT
SUT SILK 3-0 (SUTURE) ×1
SUT SILK 3-0 RB1 30XBRD (SUTURE) ×1
SUT VIC AB 3-0 SH 27 (SUTURE) ×2
SUT VIC AB 3-0 SH 27X BRD (SUTURE) ×4 IMPLANT
SUT VIC AB 4-0 PS2 27 (SUTURE) ×4 IMPLANT
SUTURE SILK 3-0 RB1 30XBRD (SUTURE) ×2 IMPLANT
SYR 10ML LL (SYRINGE) ×2 IMPLANT
SYR BULB EAR ULCER 3OZ GRN STR (SYRINGE) ×2 IMPLANT
TOWEL GREEN STERILE FF (TOWEL DISPOSABLE) ×4 IMPLANT
VASCULAR TIE MINI RED 18IN STL (MISCELLANEOUS) ×2 IMPLANT

## 2022-04-21 NOTE — Anesthesia Procedure Notes (Signed)
Procedure Name: Intubation Date/Time: 04/21/2022 2:32 PM  Performed by: Rael Yo, Ernesta Amble, CRNAPre-anesthesia Checklist: Patient identified, Emergency Drugs available, Suction available and Patient being monitored Patient Re-evaluated:Patient Re-evaluated prior to induction Oxygen Delivery Method: Circle system utilized Preoxygenation: Pre-oxygenation with 100% oxygen Induction Type: IV induction Ventilation: Mask ventilation without difficulty Laryngoscope Size: Mac, 3 and Glidescope Grade View: Grade III Tube type: Oral Tube size: 7.0 mm Number of attempts: 2 Airway Equipment and Method: Stylet, Oral airway and Video-laryngoscopy Placement Confirmation: ETT inserted through vocal cords under direct vision, positive ETCO2 and breath sounds checked- equal and bilateral Secured at: 21 cm Tube secured with: Tape Dental Injury: Teeth and Oropharynx as per pre-operative assessment  Difficulty Due To: Difficulty was anticipated Comments: Small oral opening, TM 1+, easy mask DL x 1 unable to see VC--esphogeal intubation with immediate recognition, mask, DL with glide scope VC visualized east atraumatic placement 7.0 ETT BBS +ETCO2 teeth and lips unchanged

## 2022-04-21 NOTE — Anesthesia Postprocedure Evaluation (Signed)
Anesthesia Post Note  Patient: Erin Roach  Procedure(s) Performed: IMPLANTATION OF HYPOGLOSSAL NERVE STIMULATOR (Right: Chest)     Patient location during evaluation: PACU Anesthesia Type: General Level of consciousness: awake and alert, patient cooperative and oriented Pain management: pain level controlled Vital Signs Assessment: post-procedure vital signs reviewed and stable Respiratory status: spontaneous breathing, nonlabored ventilation and respiratory function stable Cardiovascular status: blood pressure returned to baseline and stable Postop Assessment: no apparent nausea or vomiting Anesthetic complications: yes   Encounter Notable Events  Notable Event Outcome Phase Comment  Difficult to intubate - expected  Intraprocedure Filed from anesthesia note documentation.    Last Vitals:  Vitals:   04/21/22 1630 04/21/22 1635  BP: 126/69 (!) 148/76  Pulse: 90 93  Resp: 18 19  Temp:    SpO2: 96% 95%    Last Pain:  Vitals:   04/21/22 1625  TempSrc:   PainSc: 0-No pain                 Milton Sagona,E. Zaim Nitta

## 2022-04-21 NOTE — Discharge Instructions (Signed)
NO TYLENOL TILL 7pm   Post Anesthesia Home Care Instructions  Activity: Get plenty of rest for the remainder of the day. A responsible individual must stay with you for 24 hours following the procedure.  For the next 24 hours, DO NOT: -Drive a car -Paediatric nurse -Drink alcoholic beverages -Take any medication unless instructed by your physician -Make any legal decisions or sign important papers.  Meals: Start with liquid foods such as gelatin or soup. Progress to regular foods as tolerated. Avoid greasy, spicy, heavy foods. If nausea and/or vomiting occur, drink only clear liquids until the nausea and/or vomiting subsides. Call your physician if vomiting continues.  Special Instructions/Symptoms: Your throat may feel dry or sore from the anesthesia or the breathing tube placed in your throat during surgery. If this causes discomfort, gargle with warm salt water. The discomfort should disappear within 24 hours.  If you had a scopolamine patch placed behind your ear for the management of post- operative nausea and/or vomiting:  1. The medication in the patch is effective for 72 hours, after which it should be removed.  Wrap patch in a tissue and discard in the trash. Wash hands thoroughly with soap and water. 2. You may remove the patch earlier than 72 hours if you experience unpleasant side effects which may include dry mouth, dizziness or visual disturbances. 3. Avoid touching the patch. Wash your hands with soap and water after contact with the patch.

## 2022-04-21 NOTE — Brief Op Note (Signed)
04/21/2022  3:58 PM  PATIENT:  Erin Roach  81 y.o. female  PRE-OPERATIVE DIAGNOSIS:  Obstructive Sleep Apnea BMI 31.0-31.9,adult  POST-OPERATIVE DIAGNOSIS:  Obstructive Sleep ApneaBMI 31.0-31.9,adult  PROCEDURE:  Procedure(s): IMPLANTATION OF HYPOGLOSSAL NERVE STIMULATOR (Right)  SURGEON:  Surgeon(s) and Role:    Melida Quitter, MD - Primary  PHYSICIAN ASSISTANT: Sallee Provencal  ASSISTANTS: none   ANESTHESIA:   general  EBL:  5 mL   BLOOD ADMINISTERED:none  DRAINS: none   LOCAL MEDICATIONS USED:  LIDOCAINE   SPECIMEN:  No Specimen  DISPOSITION OF SPECIMEN:  N/A  COUNTS:  YES  TOURNIQUET:  * No tourniquets in log *  DICTATION: .Note written in EPIC  PLAN OF CARE: Discharge to home after PACU  PATIENT DISPOSITION:  PACU - hemodynamically stable.   Delay start of Pharmacological VTE agent (>24hrs) due to surgical blood loss or risk of bleeding: no

## 2022-04-21 NOTE — H&P (Signed)
Erin Roach is an 81 y.o. female.   Chief Complaint: Sleep apnea HPI: 81 year old female with obstructive sleep apnea who has been unable to tolerate CPAP.  Past Medical History:  Diagnosis Date   Allergic rhinitis    Apnea    Arthritis    Asthma    very mild   Back pain    Bilateral lower extremity edema    Carotid artery disease (HCC)    a. Carotid duplex 03/2016 - duplex was stable, 38-93% RICA, 7-34% LICA   Complication of anesthesia    post-op delirium   Endometrial polyp    Essential hypertension    GERD (gastroesophageal reflux disease)    Heart murmur    per pt   History of adenomatous polyp of colon    04-24-2016  tubular adenoma   History of squamous cell carcinoma excision    pilonidal area second excision 02-02-2002   HTN (hypertension)    Hyperlipidemia    Leg pain    OAB (overactive bladder)    RA (rheumatoid arthritis) (HCC)    RBBB (right bundle branch block with left anterior fascicular block)    RLS (restless legs syndrome)    Shortness of breath on exertion    SUI (stress urinary incontinence, female)    Venous insufficiency    legs   Vitamin D deficiency     Past Surgical History:  Procedure Laterality Date   APPENDECTOMY  07-02-2005   [name]   open   CARDIAC CATHETERIZATION  01-23-2009  dr Darnell Level brodie   normal coronary arteries and lvf   CARDIOVASCULAR STRESS TEST  03/31/2016   Low risk nuclear study w/ medium defect of mild severity in basal anterior and mid anterior location w/ no evidence ischemia or infarction/  normal LV function and wall motion,  nuclear stress ef 71%   CATARACT EXTRACTION W/ INTRAOCULAR LENS IMPLANT Left 09/2016   COLONOSCOPY  last one 04-24-2016   DRUG INDUCED ENDOSCOPY Bilateral 02/11/2022   Procedure: DRUG INDUCED ENDOSCOPY;  Surgeon: Melida Quitter, MD;  Location: [facility];  Service: ENT;  Laterality: Bilateral;   HYSTEROSCOPY WITH D & C N/A 12/22/2016   Procedure: DILATATION AND CURETTAGE  /HYSTEROSCOPY;  Surgeon: Dian Queen, MD;  Location: [facility];  Service: Gynecology;  Laterality: N/A;   IR US GUIDANCE  09/08/2016   POSTERIOR LUMBAR FUSION  02/ 2018    [facility] ([place])   SKIN CANCER EXCISION     TONSILLECTOMY  child   TOTAL KNEE ARTHROPLASTY Left 04/09/2015   Procedure: LEFT TOTAL KNEE ARTHROPLASTY;  Surgeon: Paralee Cancel, MD;  Location: [facility];  Service: Orthopedics;  Laterality: Left;   TRANSTHORACIC ECHOCARDIOGRAM  01/21/2009   mild LVH, ef 28-76%, grade 1 diastolic dysfunction   ULNAR NERVE TRANSPOSITION Left 12-20-2008  dr sypher   decompression and partial resection medial triceps fascia   WIDE LOCAL EXCISION PILONIDAL AREA  02-02-2002  [name]   recurrent squamous cell carcinoma in situ    Family History  Problem Relation Age of Onset   Esophageal cancer Father    Heart disease Father        Unknown what kind   Hyperlipidemia Father    Hypertension Father    Cancer Father    Heart failure Father    Hyperlipidemia Mother    Hypertension Mother    CAD Sister        12 years younger than patient - had stent. Was  told that her anorexia/bulimia may have played a role   Stroke Maternal Grandmother 66   Diabetes Maternal Grandmother    Heart attack Maternal Grandfather 93   COPD Maternal Grandfather    Heart attack Paternal Grandmother        53s   Diabetes Other        [place]   Social History:  reports that she quit smoking about 48 years ago. Her smoking use included cigarettes. She has a 5.00 pack-year smoking history. She has never used smokeless tobacco. She reports current alcohol use. She reports that she does not use drugs.  Allergies: No Known Allergies  Medications Prior to Admission  Medication Sig Dispense Refill   cholecalciferol (VITAMIN D3) 25 MCG (1000 UNIT) tablet Take 1,000 Units by mouth daily.     co-enzyme Q-10 30 MG capsule Take 30 mg by mouth daily.     eszopiclone (LUNESTA) 2 MG TABS tablet  Take 1 tablet (2 mg total) by mouth at bedtime as needed for sleep. Take immediately before bedtime 30 tablet 5   ezetimibe (ZETIA) 10 MG tablet TAKE ONE TABLET BY MOUTH DAILY 90 tablet 3   furosemide (LASIX) 20 MG tablet Take 2 tablets (40 mg total) by mouth daily. 90 tablet 3   HORIZANT 600 MG TBCR Take 1 tablet by mouth at bedtime. Patient takes 1/2 at night (300 mg)     hydrALAZINE (APRESOLINE) 50 MG tablet Take 0.5 tablets (25 mg total) by mouth 3 (three) times daily. 270 tablet 3   losartan (COZAAR) 100 MG tablet TAKE ONE TABLET BY MOUTH DAILY 90 tablet 1   magnesium gluconate (MAGONATE) 500 MG tablet Take 500 mg by mouth 2 (two) times daily.     metFORMIN (GLUCOPHAGE) 500 MG tablet TAKE 1 TABLET BY MOUTH TWICE A DAY WITH MEALS 180 tablet 1   pregabalin (LYRICA) 75 MG capsule TAKE ONE CAPSULE BY MOUTH EVERY AFTERNOON AND TAKE TWO CAPSULES BY MOUTH EVERY NIGHT AT BEDTIME 270 capsule 0   rOPINIRole (REQUIP) 0.5 MG tablet TAKE 1 TABLET BY MOUTH EVERY MORNING , 1 TABLET IN THE AFTERNOON , AND 2 TABLETS AT BEDTIME (Patient taking differently: TAKE 1 TABLET BY MOUTH EVERY MORNING , 1 TABLET IN THE AFTERNOON , AND 3 TABLETS AT BEDTIME) 360 tablet 1   Semaglutide, 1 MG/DOSE, 4 MG/3ML SOPN Inject 1 mg as directed once a week. 3 mL 5   simvastatin (ZOCOR) 40 MG tablet TAKE ONE TABLET BY MOUTH EVERY NIGHT AT BEDTIME 90 tablet 1   aspirin EC 81 MG tablet Take 1 tablet (81 mg total) by mouth daily. Swallow whole. 30 tablet 12   HYDROcodone-acetaminophen (NORCO/VICODIN) 5-325 MG tablet Take 1-2 tablets by mouth at bedtime. 60 tablet 0    Results for orders placed or performed during the hospital encounter of 04/21/22 (from the past 48 hour(s))  Glucose, capillary     Status: None   Collection Time: 04/21/22 12:54 PM  Result Value Ref Range   Glucose-Capillary 96 70 - 99 mg/dL    Comment: Glucose reference range applies only to samples taken after fasting for at least 8 hours.   Comment 1 Notify RN     Comment 2 Document in Chart    *Note: Due to a large number of results and/or encounters for the requested time period, some results have not been displayed. A complete set of results can be found in Results Review.   No results found.  Review of Systems  All  other systems reviewed and are negative.   Blood pressure (!) 140/60, pulse 75, temperature 98.5 F (36.9 C), temperature source Oral, resp. rate 16, height '5\' 6"'$  (1.676 m), weight 95.7 kg, SpO2 100 %. Physical Exam Constitutional:      Appearance: Normal appearance. She is normal weight.  HENT:     Head: Normocephalic and atraumatic.     Right Ear: External ear normal.     Left Ear: External ear normal.     Nose: Nose normal.     Mouth/Throat:     Mouth: Mucous membranes are moist.     Pharynx: Oropharynx is clear.  Eyes:     Extraocular Movements: Extraocular movements intact.     Conjunctiva/sclera: Conjunctivae normal.     Pupils: Pupils are equal, round, and reactive to light.  Cardiovascular:     Rate and Rhythm: Normal rate.  Pulmonary:     Effort: Pulmonary effort is normal.  Musculoskeletal:     Cervical back: Normal range of motion.  Skin:    General: Skin is warm and dry.  Neurological:     General: No focal deficit present.     Mental Status: She is alert and oriented to person, place, and time.  Psychiatric:        Mood and Affect: Mood normal.        Behavior: Behavior normal.        Thought Content: Thought content normal.        Judgment: Judgment normal.      Assessment/Plan Obstructive sleep apnea and BMI 34.05  To OR for hypoglossal nerve stimulator placement.  Melida Quitter, MD 04/21/2022, 1:51 PM

## 2022-04-21 NOTE — Op Note (Signed)

## 2022-04-21 NOTE — Anesthesia Preprocedure Evaluation (Signed)
Anesthesia Evaluation  Patient identified by MRN, date of birth, ID band Patient awake    Reviewed: Allergy & Precautions, NPO status , Patient's Chart, lab work & pertinent test results  History of Anesthesia Complications Negative for: history of anesthetic complications  Airway Mallampati: III  TM Distance: >3 FB Neck ROM: Full    Dental  (+) Dental Advisory Given, Teeth Intact, Caps   Pulmonary asthma , sleep apnea , former smoker   Pulmonary exam normal        Cardiovascular hypertension, Normal cardiovascular exam  Echo 12/26/21: EF 70-75%, mod LVH, trivial MR   Neuro/Psych Carotid artery disease  Carotid U/S 12/25/21: RICA 0-27% stenosis, LICA 2-53% stenosis    GI/Hepatic Neg liver ROS,GERD  ,,  Endo/Other  diabetes, Oral Hypoglycemic Agents    Renal/GU Renal InsufficiencyRenal disease  negative genitourinary   Musculoskeletal  (+) Arthritis , Rheumatoid disorders,    Abdominal   Peds  Hematology negative hematology ROS (+)   Anesthesia Other Findings   Reproductive/Obstetrics                              Anesthesia Physical Anesthesia Plan  ASA: 3  Anesthesia Plan: General   Post-op Pain Management: Tylenol PO (pre-op)*   Induction: Intravenous  PONV Risk Score and Plan: 3 and Ondansetron, Dexamethasone and Treatment may vary due to age or medical condition  Airway Management Planned: Oral ETT  Additional Equipment: None  Intra-op Plan:   Post-operative Plan: Extubation in OR  Informed Consent: I have reviewed the patients History and Physical, chart, labs and discussed the procedure including the risks, benefits and alternatives for the proposed anesthesia with the patient or authorized representative who has indicated his/her understanding and acceptance.     Dental advisory given  Plan Discussed with:   Anesthesia Plan Comments:          Anesthesia  Quick Evaluation

## 2022-04-21 NOTE — Transfer of Care (Signed)
Immediate Anesthesia Transfer of Care Note  Patient: Erin Roach  Procedure(s) Performed: IMPLANTATION OF HYPOGLOSSAL NERVE STIMULATOR (Right: Chest)  Patient Location: PACU  Anesthesia Type:General  Level of Consciousness: drowsy  Airway & Oxygen Therapy: Patient Spontanous Breathing and Patient connected to face mask oxygen  Post-op Assessment: Report given to RN and Post -op Vital signs reviewed and stable  Post vital signs: Reviewed and stable  Last Vitals:  Vitals Value Taken Time  BP 135/51 04/21/22 1622  Temp    Pulse 82 04/21/22 1624  Resp 13 04/21/22 1624  SpO2 96 % 04/21/22 1624  Vitals shown include unvalidated device data.  Last Pain:  Vitals:   04/21/22 1230  TempSrc: Oral  PainSc: 0-No pain         Complications:  Encounter Notable Events  Notable Event Outcome Phase Comment  Difficult to intubate - expected  Intraprocedure Filed from anesthesia note documentation.

## 2022-04-22 ENCOUNTER — Telehealth: Payer: Self-pay | Admitting: Internal Medicine

## 2022-04-22 ENCOUNTER — Encounter (HOSPITAL_BASED_OUTPATIENT_CLINIC_OR_DEPARTMENT_OTHER): Payer: Self-pay | Admitting: Otolaryngology

## 2022-04-22 ENCOUNTER — Ambulatory Visit: Payer: Medicare PPO | Admitting: Orthopaedic Surgery

## 2022-04-22 NOTE — Telephone Encounter (Signed)
Patient will call back to schedule AWV with [facility]  

## 2022-04-29 ENCOUNTER — Ambulatory Visit: Payer: Medicare PPO | Admitting: Orthopaedic Surgery

## 2022-05-06 ENCOUNTER — Encounter: Payer: Self-pay | Admitting: Orthopaedic Surgery

## 2022-05-06 ENCOUNTER — Ambulatory Visit: Payer: Medicare PPO | Admitting: Orthopaedic Surgery

## 2022-05-06 DIAGNOSIS — M1711 Unilateral primary osteoarthritis, right knee: Secondary | ICD-10-CM

## 2022-05-06 DIAGNOSIS — M170 Bilateral primary osteoarthritis of knee: Secondary | ICD-10-CM

## 2022-05-06 MED ORDER — HYALURONAN 30 MG/2ML IX SOSY
30.0000 mg | PREFILLED_SYRINGE | INTRA_ARTICULAR | Status: AC | PRN
  Administered 2022-05-06: 30 mg via INTRA_ARTICULAR

## 2022-05-06 MED ORDER — LIDOCAINE 4 % EX PTCH: 1.0000 | MEDICATED_PATCH | CUTANEOUS | 0 refills | Status: DC

## 2022-05-06 NOTE — Progress Notes (Signed)
Office Visit Note   Patient: Erin Roach           Date of Birth: 09/24/1940           MRN: [id number] Visit Date: 05/06/2022              Requested by: Binnie Rail, MD [place],  [place] [id number] PCP: Binnie Rail, MD   Assessment & Plan: Visit Diagnoses:  1. Bilateral primary osteoarthritis of knee     Plan: First Orthovisc injection right knee.  Return weekly for the next 2 weeks to complete the series  Follow-Up Instructions: Return in about 1 week (around 05/13/2022).   Orders:  No orders of the defined types were placed in this encounter.  Meds ordered this encounter  Medications   lidocaine (HM LIDOCAINE PATCH) 4 %    Sig: Place 1 patch onto the skin daily.    Dispense:  20 patch    Refill:  0      Procedures: Large Joint Inj: R knee on 05/06/2022 1:51 PM Indications: pain and joint swelling Details: 25 G 1.5 in needle  Arthrogram: No  Medications: 30 mg Hyaluronan 30 MG/2ML Outcome: tolerated well, no immediate complications Procedure, treatment alternatives, risks and benefits explained, specific risks discussed. Consent was given by the patient. Immediately prior to procedure a time out was called to verify the correct patient, procedure, equipment, support staff and site/side marked as required. Patient was prepped and draped in the usual sterile fashion.      Clinical Data: No additional findings.   Subjective: Chief Complaint  Patient presents with   Right Knee - Follow-up    Orthovisc #1  Patient presents today for the first Orthovisc injection into her right knee.   HPI  Review of Systems   Objective: Vital Signs: There were no vitals taken for this visit.  Physical Exam  Ortho Exam right knee was not hot red or swollen.  No effusion.  Full quick extension and flex at least to 100 degrees without instability  Specialty Comments:  No specialty comments available.  Imaging: No results found.   PMFS  History: Patient Active Problem List   Diagnosis Date Noted   Low back pain 02/05/2022   Pain in left knee 12/10/2021   Left hand weakness 11/05/2021   Bilateral primary osteoarthritis of knee 07/23/2021   Lightheadedness 04/30/2021   Pain in right knee 12/12/2020   Anemia 04/09/2020   Carotid arterial disease ([place]) - fu [name] due 12/2022 01/23/2019   RBBB (right bundle branch block with left anterior fascicular block)    Weakness of both lower extremities 01/02/2019   Frequent falls 01/02/2019   OSA  06/15/2017   Diabetes ([place]) 03/02/2017   B12 nutritional deficiency 02/23/2017   Vitamin D deficiency 12/08/2016   Tubular adenoma of colon 04/29/2016   Essential hypertension 09/12/2015   Allergic rhinitis 07/17/2015   Insomnia 07/17/2015   Obese 04/10/2015   S/P left TKA 04/09/2015   Osteopenia 12/04/2012   Tremor 11/18/2009   RESTLESS LEG SYNDROME 02/11/2009   LEG EDEMA, BILATERAL 02/11/2009   Hyperlipidemia 10/30/2008   Urinary incontinence 10/30/2008   Skin cancer 10/30/2008   Asthma 11/21/2007   Venous (peripheral) insufficiency 11/22/2006   Past Medical History:  Diagnosis Date   Allergic rhinitis    Apnea    Arthritis    Asthma    very mild   Back pain    Bilateral lower extremity edema  Carotid artery disease ([place])    a. Carotid duplex 03/2016 - duplex was stable, 40-98% RICA, 1-19% LICA   Complication of anesthesia    post-op delirium   Endometrial polyp    Essential hypertension    GERD (gastroesophageal reflux disease)    Heart murmur    per pt   History of adenomatous polyp of colon    04-24-2016  tubular adenoma   History of squamous cell carcinoma excision    pilonidal area second excision 02-02-2002   HTN (hypertension)    Hyperlipidemia    Leg pain    OAB (overactive bladder)    RA (rheumatoid arthritis) ([facility])    RBBB (right bundle branch block with left anterior fascicular block)    RLS (restless legs syndrome)    Shortness of breath on  exertion    SUI (stress urinary incontinence, female)    Venous insufficiency    legs   Vitamin D deficiency     Family History  Problem Relation Age of Onset   Esophageal cancer Father    Heart disease Father        Unknown what kind   Hyperlipidemia Father    Hypertension Father    Cancer Father    Heart failure Father    Hyperlipidemia Mother    Hypertension Mother    CAD Sister        32 years younger than patient - had stent. Was told that her anorexia/bulimia may have played a role   Stroke Maternal Grandmother 66   Diabetes Maternal Grandmother    Heart attack Maternal Grandfather 93   COPD Maternal Grandfather    Heart attack Paternal Grandmother        70s   Diabetes Other        [place]    Past Surgical History:  Procedure Laterality Date   APPENDECTOMY  07-02-2005   dr weatherly   open   CARDIAC CATHETERIZATION  01-23-2009  dr Darnell Level brodie   normal coronary arteries and lvf   CARDIOVASCULAR STRESS TEST  03/31/2016   Low risk nuclear study w/ medium defect of mild severity in basal anterior and mid anterior location w/ no evidence ischemia or infarction/  normal LV function and wall motion,  nuclear stress ef 71%   CATARACT EXTRACTION W/ INTRAOCULAR LENS IMPLANT Left 09/2016   COLONOSCOPY  last one 04-24-2016   DRUG INDUCED ENDOSCOPY Bilateral 02/11/2022   Procedure: DRUG INDUCED ENDOSCOPY;  Surgeon: Melida Quitter, MD;  Location: [facility];  Service: ENT;  Laterality: Bilateral;   HYSTEROSCOPY WITH D & C N/A 12/22/2016   Procedure: DILATATION AND CURETTAGE /HYSTEROSCOPY;  Surgeon: Dian Queen, MD;  Location: [facility];  Service: Gynecology;  Laterality: N/A;   IMPLANTATION OF HYPOGLOSSAL NERVE STIMULATOR Right 04/21/2022   Procedure: IMPLANTATION OF HYPOGLOSSAL NERVE STIMULATOR;  Surgeon: Melida Quitter, MD;  Location: [facility];  Service: ENT;  Laterality: Right;   IR US GUIDANCE  09/08/2016   POSTERIOR LUMBAR  FUSION  02/ 2018    [facility] ([place] )   SKIN CANCER EXCISION     TONSILLECTOMY  child   TOTAL KNEE ARTHROPLASTY Left 04/09/2015   Procedure: LEFT TOTAL KNEE ARTHROPLASTY;  Surgeon: Paralee Cancel, MD;  Location: [facility];  Service: Orthopedics;  Laterality: Left;   TRANSTHORACIC ECHOCARDIOGRAM  01/21/2009   mild LVH, ef 14-78%, grade 1 diastolic dysfunction   ULNAR NERVE TRANSPOSITION Left 12-20-2008  dr sypher   decompression and partial resection  medial triceps fascia   WIDE LOCAL EXCISION PILONIDAL AREA  02-02-2002  [name]   recurrent squamous cell carcinoma in situ   Social History   Occupational History   Occupation: retired Pharmacist, hospital  Tobacco Use   Smoking status: Former    Packs/day: 0.50    Years: 10.00    Total pack years: 5.00    Types: Cigarettes    Quit date: 12/10/1973    Years since quitting: 48.4   Smokeless tobacco: Never  Vaping Use   Vaping Use: Never used  Substance and Sexual Activity   Alcohol use: Yes    Comment: occasional   Drug use: No   Sexual activity: Not on file

## 2022-05-09 MED ORDER — HYDROCODONE-ACETAMINOPHEN 5-325 MG PO TABS: 1.0000 | ORAL_TABLET | Freq: Every day | ORAL | 0 refills | Status: DC

## 2022-05-09 NOTE — Addendum Note (Signed)
Addended by: Binnie Rail on: 05/09/2022 07:32 PM   Modules accepted: Orders

## 2022-05-13 ENCOUNTER — Ambulatory Visit: Payer: Medicare PPO | Admitting: Internal Medicine

## 2022-05-13 ENCOUNTER — Encounter: Payer: Self-pay | Admitting: Orthopaedic Surgery

## 2022-05-13 ENCOUNTER — Ambulatory Visit: Payer: Medicare PPO | Admitting: Orthopaedic Surgery

## 2022-05-13 DIAGNOSIS — M1711 Unilateral primary osteoarthritis, right knee: Secondary | ICD-10-CM | POA: Diagnosis not present

## 2022-05-13 DIAGNOSIS — M170 Bilateral primary osteoarthritis of knee: Secondary | ICD-10-CM

## 2022-05-13 MED ORDER — HYALURONAN 30 MG/2ML IX SOSY
30.0000 mg | PREFILLED_SYRINGE | INTRA_ARTICULAR | Status: AC | PRN
  Administered 2022-05-13: 30 mg via INTRA_ARTICULAR

## 2022-05-13 NOTE — Progress Notes (Signed)
Office Visit Note   Patient: Erin Roach           Date of Birth: 1940/09/17           MRN: [id number] Visit Date: 05/13/2022              Requested by: Binnie Rail, MD [place],  [place] [address] PCP: Binnie Rail, MD   Assessment & Plan: Visit Diagnoses:  1. Bilateral primary osteoarthritis of knee     Plan: Shakara has had 1 Orthovisc injection in her right knee and relates that it is helping the second was performed today without difficulty.  Close be seen next week for the third and final injection with Haynes Dage  Follow-Up Instructions: Return in about 1 week (around 05/20/2022).   Orders:  No orders of the defined types were placed in this encounter.  No orders of the defined types were placed in this encounter.     Procedures: Large Joint Inj: R knee on 05/13/2022 2:07 PM Indications: pain and joint swelling Details: 25 G 1.5 in needle  Arthrogram: No  Medications: 30 mg Hyaluronan 30 MG/2ML Outcome: tolerated well, no immediate complications Procedure, treatment alternatives, risks and benefits explained, specific risks discussed. Consent was given by the patient. Immediately prior to procedure a time out was called to verify the correct patient, procedure, equipment, support staff and site/side marked as required. Patient was prepped and draped in the usual sterile fashion.      Clinical Data: No additional findings.   Subjective: Chief Complaint  Patient presents with   Right Knee - Follow-up    #2 Orthovisc injection  First Orthovisc injection last week without a problem.  Second injection today  HPI  Review of Systems   Objective: Vital Signs: There were no vitals taken for this visit.  Physical Exam  Ortho Exam right knee was not hot red warm or swollen.  No significant pain.  No ecchymosis from prior injection.  No effusion  Specialty Comments:  No specialty comments available.  Imaging: No results  found.   PMFS History: Patient Active Problem List   Diagnosis Date Noted   Low back pain 02/05/2022   Pain in left knee 12/10/2021   Left hand weakness 11/05/2021   Bilateral primary osteoarthritis of knee 07/23/2021   Lightheadedness 04/30/2021   Pain in right knee 12/12/2020   Anemia 04/09/2020   Carotid arterial disease ([place]) - fu [name] due 12/2022 01/23/2019   RBBB (right bundle branch block with left anterior fascicular block)    Weakness of both lower extremities 01/02/2019   Frequent falls 01/02/2019   OSA  06/15/2017   Diabetes ([place]) 03/02/2017   B12 nutritional deficiency 02/23/2017   Vitamin D deficiency 12/08/2016   Tubular adenoma of colon 04/29/2016   Essential hypertension 09/12/2015   Allergic rhinitis 07/17/2015   Insomnia 07/17/2015   Obese 04/10/2015   S/P left TKA 04/09/2015   Osteopenia 12/04/2012   Tremor 11/18/2009   RESTLESS LEG SYNDROME 02/11/2009   LEG EDEMA, BILATERAL 02/11/2009   Hyperlipidemia 10/30/2008   Urinary incontinence 10/30/2008   Skin cancer 10/30/2008   Asthma 11/21/2007   Venous (peripheral) insufficiency 11/22/2006   Past Medical History:  Diagnosis Date   Allergic rhinitis    Apnea    Arthritis    Asthma    very mild   Back pain    Bilateral lower extremity edema    Carotid artery disease ([place])    a.  Carotid duplex 03/2016 - duplex was stable, 60-10% RICA, 9-32% LICA   Complication of anesthesia    post-op delirium   Endometrial polyp    Essential hypertension    GERD (gastroesophageal reflux disease)    Heart murmur    per pt   History of adenomatous polyp of colon    04-24-2016  tubular adenoma   History of squamous cell carcinoma excision    pilonidal area second excision 02-02-2002   HTN (hypertension)    Hyperlipidemia    Leg pain    OAB (overactive bladder)    RA (rheumatoid arthritis) ([facility])    RBBB (right bundle branch block with left anterior fascicular block)    RLS (restless legs syndrome)    Shortness  of breath on exertion    SUI (stress urinary incontinence, female)    Venous insufficiency    legs   Vitamin D deficiency     Family History  Problem Relation Age of Onset   Esophageal cancer Father    Heart disease Father        Unknown what kind   Hyperlipidemia Father    Hypertension Father    Cancer Father    Heart failure Father    Hyperlipidemia Mother    Hypertension Mother    CAD Sister        83 years younger than patient - had stent. Was told that her anorexia/bulimia may have played a role   Stroke Maternal Grandmother 66   Diabetes Maternal Grandmother    Heart attack Maternal Grandfather 93   COPD Maternal Grandfather    Heart attack Paternal Grandmother        70s   Diabetes Other        [place]    Past Surgical History:  Procedure Laterality Date   APPENDECTOMY  07-02-2005   dr weatherly   open   CARDIAC CATHETERIZATION  01-23-2009  dr Darnell Level brodie   normal coronary arteries and lvf   CARDIOVASCULAR STRESS TEST  03/31/2016   Low risk nuclear study w/ medium defect of mild severity in basal anterior and mid anterior location w/ no evidence ischemia or infarction/  normal LV function and wall motion,  nuclear stress ef 71%   CATARACT EXTRACTION W/ INTRAOCULAR LENS IMPLANT Left 09/2016   COLONOSCOPY  last one 04-24-2016   DRUG INDUCED ENDOSCOPY Bilateral 02/11/2022   Procedure: DRUG INDUCED ENDOSCOPY;  Surgeon: Melida Quitter, MD;  Location: [facility];  Service: ENT;  Laterality: Bilateral;   HYSTEROSCOPY WITH D & C N/A 12/22/2016   Procedure: DILATATION AND CURETTAGE /HYSTEROSCOPY;  Surgeon: Dian Queen, MD;  Location: [facility];  Service: Gynecology;  Laterality: N/A;   IMPLANTATION OF HYPOGLOSSAL NERVE STIMULATOR Right 04/21/2022   Procedure: IMPLANTATION OF HYPOGLOSSAL NERVE STIMULATOR;  Surgeon: Melida Quitter, MD;  Location: [facility];  Service: ENT;  Laterality: Right;   IR US GUIDANCE  09/08/2016   POSTERIOR  LUMBAR FUSION  02/ 2018    [facility] ([place], [place])   SKIN CANCER EXCISION     TONSILLECTOMY  child   TOTAL KNEE ARTHROPLASTY Left 04/09/2015   Procedure: LEFT TOTAL KNEE ARTHROPLASTY;  Surgeon: Paralee Cancel, MD;  [facility];  Service: Orthopedics;  Laterality: Left;   TRANSTHORACIC ECHOCARDIOGRAM  01/21/2009   mild LVH, ef 35-57%, grade 1 diastolic dysfunction   ULNAR NERVE TRANSPOSITION Left 12-20-2008  dr sypher   decompression and partial resection medial triceps fascia   WIDE LOCAL EXCISION  PILONIDAL AREA  02-02-2002  [name]   recurrent squamous cell carcinoma in situ   Social History   Occupational History   Occupation: retired Pharmacist, hospital  Tobacco Use   Smoking status: Former    Packs/day: 0.50    Years: 10.00    Total pack years: 5.00    Types: Cigarettes    Quit date: 12/10/1973    Years since quitting: 48.4   Smokeless tobacco: Never  Vaping Use   Vaping Use: Never used  Substance and Sexual Activity   Alcohol use: Yes    Comment: occasional   Drug use: No   Sexual activity: Not on file     Garald Balding, MD   Note - This record has been created using [employer].  Chart creation errors have been sought, but may not always  have been located. Such creation errors do not reflect on  the standard of medical care.

## 2022-05-15 ENCOUNTER — Encounter: Payer: Self-pay | Admitting: Cardiovascular Disease

## 2022-05-15 MED ORDER — SPIRONOLACTONE 25 MG PO TABS: 12.5000 mg | ORAL_TABLET | Freq: Every day | ORAL | 3 refills | Status: DC

## 2022-05-15 NOTE — Telephone Encounter (Signed)
Spoke with pt, according to last lab results, patient should be taking 12.5 mg of spironolactone daily and she reports she has been doing that. Encouraged patient to bring an updated medication list or bottles to her appointments.

## 2022-05-17 ENCOUNTER — Encounter: Payer: Self-pay | Admitting: Internal Medicine

## 2022-05-17 DIAGNOSIS — N1832 Chronic kidney disease, stage 3b: Secondary | ICD-10-CM | POA: Insufficient documentation

## 2022-05-17 NOTE — Patient Instructions (Addendum)
      Blood work was ordered.   The lab is on the first floor.    Medications changes include :   lunesta 3 mg at night.  Stop metformin.  Increase the requip - 2 pills in afternoon and 3 at night.  Taper off the [place].       Return in about 6 months (around 11/16/2022) for follow up.

## 2022-05-17 NOTE — Progress Notes (Unsigned)
Subjective:    Patient ID: Erin Roach, female    DOB: Oct 07, 1940, 82 y.o.   MRN: [id number]     HPI Sache is here for follow up of her chronic medical problems, including RLS, OSA, insomnia, htn, DM, hld, ckd, b/l leg edema  S/p inspire 12/12 Ckd - just add to lis    Medications and allergies reviewed with patient and updated if appropriate.  Current Outpatient Medications on File Prior to Visit  Medication Sig Dispense Refill   aspirin EC 81 MG tablet Take 1 tablet (81 mg total) by mouth daily. Swallow whole. 30 tablet 12   cholecalciferol (VITAMIN D3) 25 MCG (1000 UNIT) tablet Take 1,000 Units by mouth daily.     co-enzyme Q-10 30 MG capsule Take 30 mg by mouth daily.     eszopiclone (LUNESTA) 2 MG TABS tablet Take 1 tablet (2 mg total) by mouth at bedtime as needed for sleep. Take immediately before bedtime 30 tablet 5   ezetimibe (ZETIA) 10 MG tablet TAKE ONE TABLET BY MOUTH DAILY 90 tablet 3   furosemide (LASIX) 20 MG tablet Take 2 tablets (40 mg total) by mouth daily. 90 tablet 3   HORIZANT 600 MG TBCR Take 1 tablet by mouth at bedtime. Patient takes 1/2 at night (300 mg)     hydrALAZINE (APRESOLINE) 50 MG tablet Take 0.5 tablets (25 mg total) by mouth 3 (three) times daily. 270 tablet 3   HYDROcodone-acetaminophen (NORCO/VICODIN) 5-325 MG tablet Take 1-2 tablets by mouth at bedtime. 60 tablet 0   lidocaine (HM LIDOCAINE PATCH) 4 % Place 1 patch onto the skin daily. 20 patch 0   losartan (COZAAR) 100 MG tablet TAKE ONE TABLET BY MOUTH DAILY 90 tablet 1   magnesium gluconate (MAGONATE) 500 MG tablet Take 500 mg by mouth 2 (two) times daily.     metFORMIN (GLUCOPHAGE) 500 MG tablet TAKE 1 TABLET BY MOUTH TWICE A DAY WITH MEALS 180 tablet 1   pregabalin (LYRICA) 75 MG capsule TAKE ONE CAPSULE BY MOUTH EVERY AFTERNOON AND TAKE TWO CAPSULES BY MOUTH EVERY NIGHT AT BEDTIME 270 capsule 0   rOPINIRole (REQUIP) 0.5 MG tablet TAKE 1 TABLET BY MOUTH EVERY MORNING , 1 TABLET  IN THE AFTERNOON , AND 2 TABLETS AT BEDTIME (Patient taking differently: TAKE 1 TABLET BY MOUTH EVERY MORNING , 1 TABLET IN THE AFTERNOON , AND 3 TABLETS AT BEDTIME) 360 tablet 1   Semaglutide, 1 MG/DOSE, 4 MG/3ML SOPN Inject 1 mg as directed once a week. 3 mL 5   simvastatin (ZOCOR) 40 MG tablet TAKE ONE TABLET BY MOUTH EVERY NIGHT AT BEDTIME 90 tablet 1   spironolactone (ALDACTONE) 25 MG tablet Take 0.5 tablets (12.5 mg total) by mouth daily. 90 tablet 3   No current facility-administered medications on file prior to visit.     Review of Systems     Objective:  There were no vitals filed for this visit. BP Readings from Last 3 Encounters:  04/21/22 (!) 142/67  02/11/22 127/63  02/10/22 118/78   Wt Readings from Last 3 Encounters:  04/21/22 210 lb 15.7 oz (95.7 kg)  02/11/22 210 lb 8.6 oz (95.5 kg)  02/10/22 207 lb (93.9 kg)   There is no height or weight on file to calculate BMI.    Physical Exam     Lab Results  Component Value Date   WBC 8.5 11/18/2021   HGB 12.3 11/18/2021   HCT 36.7 11/18/2021  PLT 308.0 11/18/2021   GLUCOSE 108 (H) 04/16/2022   CHOL 169 12/05/2021   TRIG 214.0 (H) 12/05/2021   HDL 55.30 12/05/2021   LDLDIRECT 84.0 12/05/2021   LDLCALC 55 09/29/2021   ALT 23 12/05/2021   AST 21 12/05/2021   NA 138 04/16/2022   K 5.2 (H) 04/16/2022   CL 108 04/16/2022   CREATININE 1.38 (H) 04/16/2022   BUN 22 04/16/2022   CO2 20 (L) 04/16/2022   TSH 5.130 (H) 12/13/2018   INR 1.03 03/27/2015   HGBA1C 6.1 12/05/2021   MICROALBUR <0.7 12/05/2021     Assessment & Plan:    See Problem List for Assessment and Plan of chronic medical problems.

## 2022-05-18 ENCOUNTER — Ambulatory Visit: Payer: Medicare PPO | Admitting: Internal Medicine

## 2022-05-18 VITALS — BP 118/72 | HR 78 | Temp 98.4°F | Ht 66.0 in | Wt 211.0 lb

## 2022-05-18 DIAGNOSIS — G2581 Restless legs syndrome: Secondary | ICD-10-CM | POA: Diagnosis not present

## 2022-05-18 DIAGNOSIS — E538 Deficiency of other specified B group vitamins: Secondary | ICD-10-CM

## 2022-05-18 DIAGNOSIS — G4709 Other insomnia: Secondary | ICD-10-CM | POA: Diagnosis not present

## 2022-05-18 DIAGNOSIS — E1129 Type 2 diabetes mellitus with other diabetic kidney complication: Secondary | ICD-10-CM | POA: Diagnosis not present

## 2022-05-18 DIAGNOSIS — R600 Localized edema: Secondary | ICD-10-CM

## 2022-05-18 DIAGNOSIS — G4733 Obstructive sleep apnea (adult) (pediatric): Secondary | ICD-10-CM | POA: Diagnosis not present

## 2022-05-18 DIAGNOSIS — N1832 Chronic kidney disease, stage 3b: Secondary | ICD-10-CM | POA: Diagnosis not present

## 2022-05-18 DIAGNOSIS — R809 Proteinuria, unspecified: Secondary | ICD-10-CM | POA: Diagnosis not present

## 2022-05-18 DIAGNOSIS — I10 Essential (primary) hypertension: Secondary | ICD-10-CM | POA: Diagnosis not present

## 2022-05-18 DIAGNOSIS — E7849 Other hyperlipidemia: Secondary | ICD-10-CM | POA: Diagnosis not present

## 2022-05-18 DIAGNOSIS — G629 Polyneuropathy, unspecified: Secondary | ICD-10-CM | POA: Insufficient documentation

## 2022-05-18 DIAGNOSIS — G6289 Other specified polyneuropathies: Secondary | ICD-10-CM

## 2022-05-18 LAB — LIPID PANEL
Cholesterol: 191 mg/dL (ref 0–200)
HDL: 53.1 mg/dL (ref >39.00)
NonHDL: 137.67
Total CHOL/HDL Ratio: 4
Triglycerides: 231 mg/dL — ABNORMAL HIGH (ref 0.0–149.0)
VLDL: 46.2 mg/dL — ABNORMAL HIGH (ref 0.0–40.0)

## 2022-05-18 LAB — CBC WITH DIFFERENTIAL/PLATELET
Basophils Absolute: 0.1 10*3/uL (ref 0.0–0.1)
Basophils Relative: 1.2 % (ref 0.0–3.0)
Eosinophils Absolute: 0.4 10*3/uL (ref 0.0–0.7)
Eosinophils Relative: 4.3 % (ref 0.0–5.0)
HCT: 36.1 % (ref 36.0–46.0)
Hemoglobin: 11.9 g/dL — ABNORMAL LOW (ref 12.0–15.0)
Lymphocytes Relative: 22.6 % (ref 12.0–46.0)
Lymphs Abs: 2 10*3/uL (ref 0.7–4.0)
MCHC: 33 g/dL (ref 30.0–36.0)
MCV: 86.2 fl (ref 78.0–100.0)
Monocytes Absolute: 0.8 10*3/uL (ref 0.1–1.0)
Monocytes Relative: 9.2 % (ref 3.0–12.0)
Neutro Abs: 5.4 10*3/uL (ref 1.4–7.7)
Neutrophils Relative %: 62.7 % (ref 43.0–77.0)
Platelets: 254 10*3/uL (ref 150.0–400.0)
RBC: 4.19 Mil/uL (ref 3.87–5.11)
RDW: 14.5 % (ref 11.5–15.5)
WBC: 8.6 10*3/uL (ref 4.0–10.5)

## 2022-05-18 LAB — MAGNESIUM: Magnesium: 2.2 mg/dL (ref 1.5–2.5)

## 2022-05-18 LAB — COMPREHENSIVE METABOLIC PANEL
ALT: 23 U/L (ref 0–35)
AST: 18 U/L (ref 0–37)
Albumin: 4.1 g/dL (ref 3.5–5.2)
Alkaline Phosphatase: 58 U/L (ref 39–117)
BUN: 31 mg/dL — ABNORMAL HIGH (ref 6–23)
CO2: 24 mEq/L (ref 19–32)
Calcium: 9 mg/dL (ref 8.4–10.5)
Chloride: 108 mEq/L (ref 96–112)
Creatinine, Ser: 1.18 mg/dL (ref 0.40–1.20)
GFR: 43.98 mL/min — ABNORMAL LOW (ref >60.00)
Glucose, Bld: 110 mg/dL — ABNORMAL HIGH (ref 70–99)
Potassium: 5.3 mEq/L — ABNORMAL HIGH (ref 3.5–5.1)
Sodium: 140 mEq/L (ref 135–145)
Total Bilirubin: 0.2 mg/dL (ref 0.2–1.2)
Total Protein: 6.8 g/dL (ref 6.0–8.3)

## 2022-05-18 LAB — VITAMIN B12: Vitamin B-12: 265 pg/mL (ref 211–911)

## 2022-05-18 LAB — HEMOGLOBIN A1C: Hgb A1c MFr Bld: 6.3 % (ref 4.6–6.5)

## 2022-05-18 LAB — VITAMIN D 25 HYDROXY (VIT D DEFICIENCY, FRACTURES): VITD: 23.15 ng/mL — ABNORMAL LOW (ref 30.00–100.00)

## 2022-05-18 LAB — LDL CHOLESTEROL, DIRECT: Direct LDL: 110 mg/dL

## 2022-05-18 MED ORDER — FUROSEMIDE 20 MG PO TABS: 40.0000 mg | ORAL_TABLET | Freq: Every day | ORAL | 3 refills | Status: DC

## 2022-05-18 MED ORDER — ESZOPICLONE 3 MG PO TABS: 3.0000 mg | ORAL_TABLET | Freq: Every day | ORAL | 5 refills | Status: DC

## 2022-05-18 MED ORDER — ROPINIROLE HCL 0.5 MG PO TABS: ORAL_TABLET | ORAL | 1 refills | Status: DC

## 2022-05-18 NOTE — Assessment & Plan Note (Signed)
Chronic Leg edema related to venous insufficiency and probably Lyrica Controlled Continue furosemide 40 mg daily Continue elevating legs, low-sodium diet, compression socks, using wedge pillow at night Stressed regular exercise

## 2022-05-18 NOTE — Assessment & Plan Note (Signed)
Chronic Blood pressure well controlled CMP Continue hydralazine 25 mg 3 times daily, losartan 100 mg daily, spironolactone 12.5 mg daily

## 2022-05-18 NOTE — Assessment & Plan Note (Signed)
Chronic Avoid NSAIDs Maintain good water intake Ideally would like to decrease some of her medications which may help

## 2022-05-18 NOTE — Assessment & Plan Note (Signed)
Chronic Did not tolerate CPAP Had inspire placed 04/21/2022

## 2022-05-18 NOTE — Assessment & Plan Note (Addendum)
Chronic   Lab Results  Component Value Date   HGBA1C 6.1 12/05/2021   Sugars well controlled Check A1c Stop metformin Continue Ozempic 1 mg weekly Stressed regular exercise, diabetic diet

## 2022-05-18 NOTE — Assessment & Plan Note (Addendum)
Chronic Somewhat controlled - sleep typically not good Continue Lunesta, but increase to 3 mg nightly

## 2022-05-18 NOTE — Assessment & Plan Note (Signed)
Check b12 level  

## 2022-05-18 NOTE — Assessment & Plan Note (Signed)
Chronic Likely causing her balance problems Check B12

## 2022-05-18 NOTE — Assessment & Plan Note (Signed)
Chronic Continue simvastatin 40 mg daily

## 2022-05-18 NOTE — Assessment & Plan Note (Addendum)
Chronic Poorly controlled Will be seeing the specialist Will try tapering off of horizont 600 mg nightly,  Continue hydrocodone-acetaminophen 5-325 mg-1-2 tabs at bedtime, Lyrica 75 mg in the morning and afternoon and 150 mg at night, inc to Requip 1 mg afternoon, 1.5 mg at bedtime

## 2022-05-19 ENCOUNTER — Encounter: Payer: Self-pay | Admitting: Internal Medicine

## 2022-05-21 ENCOUNTER — Other Ambulatory Visit: Payer: Self-pay

## 2022-05-21 ENCOUNTER — Other Ambulatory Visit: Payer: Self-pay | Admitting: Internal Medicine

## 2022-05-21 ENCOUNTER — Ambulatory Visit: Payer: Medicare PPO | Admitting: Physician Assistant

## 2022-05-21 ENCOUNTER — Encounter: Payer: Self-pay | Admitting: Physician Assistant

## 2022-05-21 DIAGNOSIS — M1711 Unilateral primary osteoarthritis, right knee: Secondary | ICD-10-CM | POA: Diagnosis not present

## 2022-05-21 DIAGNOSIS — M170 Bilateral primary osteoarthritis of knee: Secondary | ICD-10-CM

## 2022-05-21 MED ORDER — SEMAGLUTIDE (2 MG/DOSE) 8 MG/3ML ~~LOC~~ SOPN: 2.0000 mg | PEN_INJECTOR | SUBCUTANEOUS | 5 refills | Status: DC

## 2022-05-21 MED ORDER — LOSARTAN POTASSIUM 100 MG PO TABS: 50.0000 mg | ORAL_TABLET | Freq: Every day | ORAL | 1 refills | Status: DC

## 2022-05-21 MED ORDER — HYALURONAN 30 MG/2ML IX SOSY
30.0000 mg | PREFILLED_SYRINGE | INTRA_ARTICULAR | Status: AC | PRN
  Administered 2022-05-21: 30 mg via INTRA_ARTICULAR

## 2022-05-21 MED ORDER — LOSARTAN POTASSIUM 50 MG PO TABS: 50.0000 mg | ORAL_TABLET | Freq: Every day | ORAL | 1 refills | Status: DC

## 2022-05-21 MED ORDER — LOSARTAN POTASSIUM 100 MG PO TABS: 50.0000 mg | ORAL_TABLET | Freq: Every day | ORAL | 2 refills | Status: DC

## 2022-05-21 MED ORDER — SEMAGLUTIDE (2 MG/DOSE) 8 MG/3ML ~~LOC~~ SOPN: 2.0000 mg | PEN_INJECTOR | SUBCUTANEOUS | 3 refills | Status: DC

## 2022-05-21 NOTE — Progress Notes (Signed)
Office Visit Note   Patient: Erin Roach           Date of Birth: [birth year]           MRN: [id number] Visit Date: 05/21/2022              Requested by: Binnie Rail, MD [place],  [place] [id number] PCP: Binnie Rail, MD  Chief Complaint  Patient presents with  . Right Knee - Pain, Follow-up      HPI: Erin Roach is a pleasant 82 year old woman who comes in for her third Orthovisc injection into her right knee she feels they have been quite helpful.  She has had no reactions   Assessment & Plan: Visit Diagnoses: Osteoarthritis right knee  Plan: Third Orthovisc injection was completed without difficulty.  She may follow-up in 5 months if she feels like she would like to do another series we will reevaluate her of course she can come in anytime for a cortisone injection  Follow-Up Instructions: Return in about 5 months (around 10/20/2022).   Ortho Exam  Patient is alert, oriented, no adenopathy, well-dressed, normal affect, normal respiratory effort. Right knee no effusion no erythema compartments are soft and nontender she is neurovascularly intact  Imaging: No results found. No images are attached to the encounter.  Labs: Lab Results  Component Value Date   HGBA1C 6.3 05/18/2022   HGBA1C 6.1 12/05/2021   HGBA1C 6.2 04/30/2021   ESRSEDRATE 38 (H) 04/12/2017   CRP 6.1 04/12/2017   LABORGA ESCHERICHIA COLI 12/16/2012     Lab Results  Component Value Date   ALBUMIN 4.1 05/18/2022   ALBUMIN 4.1 12/05/2021   ALBUMIN 4.3 11/18/2021   PREALBUMIN 33 (H) 04/20/2018    Lab Results  Component Value Date   MG 2.2 05/18/2022   MG 1.8 02/23/2017   Lab Results  Component Value Date   VD25OH 23.15 (L) 05/18/2022   VD25OH 33.2 07/03/2019   VD25OH 52.4 12/13/2018    Lab Results  Component Value Date   PREALBUMIN 33 (H) 04/20/2018      Latest Ref Rng & Units 05/18/2022    3:35 PM 11/18/2021   10:22 AM 04/30/2021    3:13 PM  CBC EXTENDED  WBC  4.0 - 10.5 K/uL 8.6  8.5  10.5   RBC 3.87 - 5.11 Mil/uL 4.19  4.11  4.01   Hemoglobin 12.0 - 15.0 g/dL 11.9  12.3  11.7   HCT 36.0 - 46.0 % 36.1  36.7  34.3   Platelets 150.0 - 400.0 K/uL 254.0  308.0  277.0   NEUT# 1.4 - 7.7 K/uL 5.4  5.1  7.2   Lymph# 0.7 - 4.0 K/uL 2.0  1.8  2.1      There is no height or weight on file to calculate BMI.  Orders:  No orders of the defined types were placed in this encounter.  No orders of the defined types were placed in this encounter.    Procedures: Large Joint Inj: R knee on 05/21/2022 11:01 AM Indications: pain and diagnostic evaluation Details: 25 G 1.5 in needle, anteromedial approach  Arthrogram: No  Medications: 30 mg Hyaluronan 30 MG/2ML Outcome: tolerated well, no immediate complications Procedure, treatment alternatives, risks and benefits explained, specific risks discussed. Consent was given by the patient. Immediately prior to procedure a time out was called to verify the correct patient, procedure, equipment, support staff and site/side marked as required. Patient was prepped and draped in  the usual sterile fashion.    Clinical Data: No additional findings.  ROS:  All other systems negative, except as noted in the HPI. Review of Systems  Objective: Vital Signs: There were no vitals taken for this visit.  Specialty Comments:  No specialty comments available.  PMFS History: Patient Active Problem List   Diagnosis Date Noted  . Peripheral neuropathy 05/18/2022  . Stage 3b chronic kidney disease ([place]) 05/17/2022  . Low back pain 02/05/2022  . Pain in left knee 12/10/2021  . Left hand weakness 11/05/2021  . Bilateral primary osteoarthritis of knee 07/23/2021  . Lightheadedness 04/30/2021  . Pain in right knee 12/12/2020  . Anemia 04/09/2020  . Carotid arterial disease ([place]) - fu [name] due 12/2022 01/23/2019  . RBBB (right bundle branch block with left anterior fascicular block)   . Weakness of both lower extremities  01/02/2019  . Frequent falls 01/02/2019  . OSA  06/15/2017  . Diabetes ([place]) 03/02/2017  . B12 nutritional deficiency 02/23/2017  . Vitamin D deficiency 12/08/2016  . Tubular adenoma of colon 04/29/2016  . Essential hypertension 09/12/2015  . Allergic rhinitis 07/17/2015  . Insomnia 07/17/2015  . Obese 04/10/2015  . S/P left TKA 04/09/2015  . Osteopenia 12/04/2012  . Tremor 11/18/2009  . RESTLESS LEG SYNDROME 02/11/2009  . LEG EDEMA, BILATERAL 02/11/2009  . Hyperlipidemia 10/30/2008  . Urinary incontinence 10/30/2008  . Skin cancer 10/30/2008  . Asthma 11/21/2007  . Venous (peripheral) insufficiency 11/22/2006   Past Medical History:  Diagnosis Date  . Allergic rhinitis   . Apnea   . Arthritis   . Asthma    very mild  . Back pain   . Bilateral lower extremity edema   . Carotid artery disease ([place])    a. Carotid duplex 03/2016 - duplex was stable, 76-19% RICA, 5-09% LICA  . Complication of anesthesia    post-op delirium  . Endometrial polyp   . Essential hypertension   . GERD (gastroesophageal reflux disease)   . Heart murmur    per pt  . History of adenomatous polyp of colon    04-24-2016  tubular adenoma  . History of squamous cell carcinoma excision    pilonidal area second excision 02-02-2002  . HTN (hypertension)   . Hyperlipidemia   . Leg pain   . OAB (overactive bladder)   . RA (rheumatoid arthritis) ([place])   . RBBB (right bundle branch block with left anterior fascicular block)   . RLS (restless legs syndrome)   . Shortness of breath on exertion   . SUI (stress urinary incontinence, female)   . Venous insufficiency    legs  . Vitamin D deficiency     Family History  Problem Relation Age of Onset  . Esophageal cancer Father   . Heart disease Father        Unknown what kind  . Hyperlipidemia Father   . Hypertension Father   . Cancer Father   . Heart failure Father   . Hyperlipidemia Mother   . Hypertension Mother   . CAD Sister        48 years  younger than patient - had stent. Was told that her anorexia/bulimia may have played a role  . Stroke Maternal Grandmother 66  . Diabetes Maternal Grandmother   . Heart attack Maternal Grandfather 93  . COPD Maternal Grandfather   . Heart attack Paternal Grandmother        36s  . Diabetes Other  [place]    Past Surgical History:  Procedure Laterality Date  . APPENDECTOMY  07-02-2005   [name]   open  . CARDIAC CATHETERIZATION  01-23-2009  dr Darnell Level brodie   normal coronary arteries and lvf  . CARDIOVASCULAR STRESS TEST  03/31/2016   Low risk nuclear study w/ medium defect of mild severity in basal anterior and mid anterior location w/ no evidence ischemia or infarction/  normal LV function and wall motion,  nuclear stress ef 71%  . CATARACT EXTRACTION W/ INTRAOCULAR LENS IMPLANT Left 09/2016  . COLONOSCOPY  last one 04-24-2016  . DRUG INDUCED ENDOSCOPY Bilateral 02/11/2022   Procedure: DRUG INDUCED ENDOSCOPY;  Surgeon: Melida Quitter, MD;  Location: [facility];  Service: ENT;  Laterality: Bilateral;  . HYSTEROSCOPY WITH D & C N/A 12/22/2016   Procedure: DILATATION AND CURETTAGE /HYSTEROSCOPY;  Surgeon: Dian Queen, MD;  Location: [facility];  Service: Gynecology;  Laterality: N/A;  . IMPLANTATION OF HYPOGLOSSAL NERVE STIMULATOR Right 04/21/2022   Procedure: IMPLANTATION OF HYPOGLOSSAL NERVE STIMULATOR;  Surgeon: Melida Quitter, MD;  Location: [facility];  Service: ENT;  Laterality: Right;  . IR US GUIDANCE  09/08/2016  . POSTERIOR LUMBAR FUSION  02/ 2018    [facility] ([place])  . SKIN CANCER EXCISION    . TONSILLECTOMY  child  . 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 TRANSTHORACIC ECHOCARDIOGRAM  01/21/2009   mild LVH, ef 96-28%, grade 1 diastolic dysfunction  . ULNAR NERVE  TRANSPOSITION Left 12-20-2008  dr sypher   decompression and partial resection medial triceps fascia  . WIDE LOCAL EXCISION PILONIDAL AREA  02-02-2002  [name]   recurrent squamous cell carcinoma in situ   Social History   Occupational History  . Occupation: retired Pharmacist, hospital  Tobacco Use  . Smoking status: Former    Packs/day: 0.50    Years: 10.00    Total pack years: 5.00    Types: Cigarettes    Quit date: 12/10/1973    Years since quitting: 48.4  . Smokeless tobacco: Never  Vaping Use  . Vaping Use: Never used  Substance and Sexual Activity  . Alcohol use: Yes    Comment: occasional  . Drug use: No  . Sexual activity: Not on file

## 2022-05-21 NOTE — Addendum Note (Signed)
Addended by: Binnie Rail on: 05/21/2022 07:46 PM   Modules accepted: Orders

## 2022-05-31 MED ORDER — HORIZANT 600 MG PO TBCR: 1.0000 | EXTENDED_RELEASE_TABLET | Freq: Every day | ORAL | 1 refills | Status: DC

## 2022-05-31 NOTE — Addendum Note (Signed)
Addended by: Binnie Rail on: 05/31/2022 07:50 PM   Modules accepted: Orders

## 2022-06-01 DIAGNOSIS — G4723 Circadian rhythm sleep disorder, irregular sleep wake type: Secondary | ICD-10-CM | POA: Diagnosis not present

## 2022-06-01 DIAGNOSIS — I10 Essential (primary) hypertension: Secondary | ICD-10-CM | POA: Diagnosis not present

## 2022-06-01 DIAGNOSIS — G4733 Obstructive sleep apnea (adult) (pediatric): Secondary | ICD-10-CM | POA: Diagnosis not present

## 2022-06-03 DIAGNOSIS — I739 Peripheral vascular disease, unspecified: Secondary | ICD-10-CM | POA: Diagnosis not present

## 2022-06-03 DIAGNOSIS — B351 Tinea unguium: Secondary | ICD-10-CM | POA: Diagnosis not present

## 2022-06-03 DIAGNOSIS — L603 Nail dystrophy: Secondary | ICD-10-CM | POA: Diagnosis not present

## 2022-06-04 ENCOUNTER — Encounter: Payer: Self-pay | Admitting: Internal Medicine

## 2022-06-05 ENCOUNTER — Other Ambulatory Visit: Payer: Self-pay | Admitting: Nurse Practitioner

## 2022-06-05 ENCOUNTER — Ambulatory Visit: Payer: Medicare PPO | Admitting: Nurse Practitioner

## 2022-06-05 VITALS — BP 144/76 | HR 103 | Temp 98.3°F | Ht 66.0 in | Wt 213.1 lb

## 2022-06-05 DIAGNOSIS — G2581 Restless legs syndrome: Secondary | ICD-10-CM

## 2022-06-05 DIAGNOSIS — R059 Cough, unspecified: Secondary | ICD-10-CM | POA: Insufficient documentation

## 2022-06-05 DIAGNOSIS — U071 COVID-19: Secondary | ICD-10-CM | POA: Diagnosis not present

## 2022-06-05 DIAGNOSIS — E875 Hyperkalemia: Secondary | ICD-10-CM | POA: Diagnosis not present

## 2022-06-05 LAB — POCT RESPIRATORY SYNCYTIAL VIRUS: RSV Rapid Ag: NEGATIVE

## 2022-06-05 LAB — POCT INFLUENZA A/B
Influenza A, POC: NEGATIVE
Influenza B, POC: NEGATIVE

## 2022-06-05 LAB — BASIC METABOLIC PANEL
BUN: 23 mg/dL (ref 6–23)
CO2: 23 mEq/L (ref 19–32)
Calcium: 8.8 mg/dL (ref 8.4–10.5)
Chloride: 105 mEq/L (ref 96–112)
Creatinine, Ser: 0.97 mg/dL (ref 0.40–1.20)
GFR: 55.62 mL/min — ABNORMAL LOW (ref >60.00)
Glucose, Bld: 126 mg/dL — ABNORMAL HIGH (ref 70–99)
Potassium: 4.5 mEq/L (ref 3.5–5.1)
Sodium: 136 mEq/L (ref 135–145)

## 2022-06-05 LAB — IRON: Iron: 64 ug/dL (ref 42–145)

## 2022-06-05 LAB — FERRITIN: Ferritin: 19.4 ng/mL (ref 10.0–291.0)

## 2022-06-05 LAB — POC COVID19 BINAXNOW: SARS Coronavirus 2 Ag: POSITIVE — AB

## 2022-06-05 MED ORDER — NIRMATRELVIR/RITONAVIR (PAXLOVID) TABLET (RENAL DOSING): 2.0000 | ORAL_TABLET | Freq: Two times a day (BID) | ORAL | 0 refills | Status: AC

## 2022-06-05 MED ORDER — PREDNISONE 20 MG PO TABS: 20.0000 mg | ORAL_TABLET | Freq: Every day | ORAL | 0 refills | Status: DC

## 2022-06-05 MED ORDER — ALBUTEROL SULFATE HFA 108 (90 BASE) MCG/ACT IN AERS: 2.0000 | INHALATION_SPRAY | Freq: Four times a day (QID) | RESPIRATORY_TRACT | 0 refills | Status: DC | PRN

## 2022-06-05 NOTE — Progress Notes (Signed)
Established Patient Office Visit  Subjective   Patient ID: Erin Roach, female    DOB: 1941/05/09  Age: 82 y.o. MRN: [id number]  No chief complaint on file.   Symptom onset 2 days ago.  Feeling fatigued, coughing, short of breath, wheezing, nausea, headache some chest pain with coughing.  Has been vaccinated against COVID x 2 has not yet tested.  Also has restless leg syndrome and chronic kidney disease.  Was told by a specialist to consider checking iron and ferritin as this could be related to more severe RLS.    Review of Systems  Constitutional:  Positive for malaise/fatigue. Negative for fever.  Respiratory:  Positive for cough, shortness of breath and wheezing.   Cardiovascular:  Positive for chest pain (with coughing).  Gastrointestinal:  Positive for nausea. Negative for diarrhea and vomiting.  Musculoskeletal:  Negative for myalgias.  Neurological:  Positive for headaches.      Objective:     BP (!) 144/76   Pulse (!) 103   Temp 98.3 F (36.8 C) (Temporal)   Ht '5\' 6"'$  (1.676 m)   Wt 213 lb 2 oz (96.7 kg)   SpO2 95%   BMI 34.40 kg/m    Physical Exam Vitals reviewed.  Constitutional:      General: She is not in acute distress.    Appearance: Normal appearance.  HENT:     Head: Normocephalic and atraumatic.  Neck:     Vascular: No carotid bruit.  Cardiovascular:     Rate and Rhythm: Normal rate and regular rhythm.     Pulses: Normal pulses.     Heart sounds: Normal heart sounds.  Pulmonary:     Effort: Pulmonary effort is normal.     Breath sounds: Normal breath sounds.  Skin:    General: Skin is warm and dry.  Neurological:     General: No focal deficit present.     Mental Status: She is alert and oriented to person, place, and time.  Psychiatric:        Mood and Affect: Mood normal.        Behavior: Behavior normal.        Judgment: Judgment normal.      Results for orders placed or performed in visit on 06/05/22  Ferritin  Result  Value Ref Range   Ferritin 19.4 10.0 - 291.0 ng/mL  Iron  Result Value Ref Range   Iron 64 42 - 145 ug/dL  Basic metabolic panel  Result Value Ref Range   Sodium 136 135 - 145 mEq/L   Potassium 4.5 3.5 - 5.1 mEq/L   Chloride 105 96 - 112 mEq/L   CO2 23 19 - 32 mEq/L   Glucose, Bld 126 (H) 70 - 99 mg/dL   BUN 23 6 - 23 mg/dL   Creatinine, Ser 0.97 0.40 - 1.20 mg/dL   GFR 55.62 (L) >60.00 mL/min   Calcium 8.8 8.4 - 10.5 mg/dL  POC COVID-19  Result Value Ref Range   SARS Coronavirus 2 Ag Positive (A) Negative  POCT Influenza A/B  Result Value Ref Range   Influenza A, POC Negative Negative   Influenza B, POC Negative Negative  POCT respiratory syncytial virus  Result Value Ref Range   RSV Rapid Ag negative       The 10-year ASCVD risk score (Arnett DK, et al., 2019) is: 59.8%    Assessment & Plan:   Problem List Items Addressed This Visit       Other  RESTLESS LEG SYNDROME    Iron and ferritin within normal limits.  No additional treatment recommendations made today.      Relevant Orders   Iron (Completed)   Ferritin (Completed)   Hyperkalemia    Repeat BMP shows potassium normalized.      Relevant Orders   Basic metabolic panel (Completed)   Cough - Primary    Acute, point-of-care COVID-19 test is positive.  Patient has multiple chronic conditions that puts her at high risk for more severe illness.  Has been vaccinated against COVID-19 x 2.  Does have chronic kidney disease but could tolerate renal dosing of Paxlovid.  Paxlovid sent to patient's pharmacy, she was told to stop simvastatin while on Paxlovid and to take a small of a dose of hydrocodone-acetaminophen as possible due to a possible interactions between this and Paxlovid.  Patient reports understanding.  Also discussed current recommendations for isolation quarantine.  Patient reports understanding.  Patient encouraged to go to the emergency department if symptoms worsen.  Will also treat with albuterol  and short course of prednisone.       Return if symptoms worsen or fail to improve.    Ailene Ards, NP

## 2022-06-05 NOTE — Assessment & Plan Note (Signed)
Repeat BMP shows potassium normalized.

## 2022-06-05 NOTE — Assessment & Plan Note (Signed)
Iron and ferritin within normal limits.  No additional treatment recommendations made today.

## 2022-06-05 NOTE — Assessment & Plan Note (Signed)
Acute, point-of-care COVID-19 test is positive.  Patient has multiple chronic conditions that puts her at high risk for more severe illness.  Has been vaccinated against COVID-19 x 2.  Does have chronic kidney disease but could tolerate renal dosing of Paxlovid.  Paxlovid sent to patient's pharmacy, she was told to stop simvastatin while on Paxlovid and to take a small of a dose of hydrocodone-acetaminophen as possible due to a possible interactions between this and Paxlovid.  Patient reports understanding.  Also discussed current recommendations for isolation quarantine.  Patient reports understanding.  Patient encouraged to go to the emergency department if symptoms worsen.  Will also treat with albuterol and short course of prednisone.

## 2022-06-05 NOTE — Progress Notes (Signed)
Left voice mail for pt to call back on how to take medication

## 2022-06-05 NOTE — Patient Instructions (Addendum)
Isolate at home through 06/08/22; Wear a mask when in public through 09/13/13. We will call you with further instructions after checking your labs.  IF WE ARE ABLE TO PRESCRIBE THE PAXLOVID for Covid - then you should reduce dose of hydrocodone to 1/2 tablet at night and stop the simvastatin while on paxlovid. You can restart both medications as prescribed once you have completed the paxlovid.

## 2022-06-05 NOTE — Progress Notes (Signed)
Left pt voice mail, was not able to reach out to pt Mychart message send to pt

## 2022-06-05 NOTE — Progress Notes (Signed)
Left voice mail for pt to call back to relay note by provider

## 2022-06-05 NOTE — Progress Notes (Signed)
Please call patient and notify her that her potassium is now normal and kidney function is stable.   I have ordered paxlovid for her at the lower dose due to her chronic kidney disease. She should take 2 tablets by mouth twice a day for 5 days as instructed on package insert. Please remind her that she needs to take as small of a dose of her hydrocodone-acetaminophen as possible due to risk of interaction and possibly fatal respiratory depression when taking the pain medication and the paxlovid. Additionally, she needs to NOT take her simvastatin while taking the paxlovid. She can restart both meds at regular dosing recommendations once she is off of the paxlovid.   Please verify that she has no history of GI bleed, as long as she has never had a GI bleed she can also take prednisone once a day for 5 days with a snack/meal. IF she has severe headache, blurry vision, chest pain or blood pressure >150/>100 she should stop the prednisone. Any signs of heart attack or stroke (difficulty feeling/moving limb, difficulty swallowing, difficulty talking, facial droop) should prompt her to call 911.   Will also send in albuterol inhaler and she can take 2 puffs into the lungs every 6 hours as needed for wheezing or shortness of breath. Feel free to also copy this into a mychart message for her to refer to if needed, but please try to notify her over the phone as well in case she has questions. Thank you.

## 2022-06-09 ENCOUNTER — Encounter: Payer: Self-pay | Admitting: Internal Medicine

## 2022-06-09 NOTE — Telephone Encounter (Signed)
Per chart pt has appt on 06/11/22.Marland KitchenJohny Roach

## 2022-06-10 MED ORDER — PROMETHAZINE-DM 6.25-15 MG/5ML PO SYRP: 5.0000 mL | ORAL_SOLUTION | Freq: Four times a day (QID) | ORAL | 0 refills | Status: DC | PRN

## 2022-06-10 NOTE — Progress Notes (Unsigned)
Subjective:    Patient ID: Erin Roach, female    DOB: 06-11-1940, 82 y.o.   MRN: [id number]      HPI Erin Roach is here for No chief complaint on file.   Covid positive last week - saw Judson Roch 1/26.  Completed paxlovid.       Medications and allergies reviewed with patient and updated if appropriate.  Current Outpatient Medications on File Prior to Visit  Medication Sig Dispense Refill   albuterol (VENTOLIN HFA) 108 (90 Base) MCG/ACT inhaler Inhale 2 puffs into the lungs every 6 (six) hours as needed for wheezing or shortness of breath. 8 g 0   aspirin EC 81 MG tablet Take 1 tablet (81 mg total) by mouth daily. Swallow whole. 30 tablet 12   cholecalciferol (VITAMIN D3) 25 MCG (1000 UNIT) tablet Take 1,000 Units by mouth daily.     co-enzyme Q-10 30 MG capsule Take 30 mg by mouth daily.     Eszopiclone 3 MG TABS Take 1 tablet (3 mg total) by mouth at bedtime. Take immediately before bedtime 30 tablet 5   ezetimibe (ZETIA) 10 MG tablet TAKE ONE TABLET BY MOUTH DAILY 90 tablet 3   furosemide (LASIX) 20 MG tablet Take 2 tablets (40 mg total) by mouth daily. 90 tablet 3   HORIZANT 600 MG TBCR Take 1 tablet (600 mg total) by mouth at bedtime. 90 tablet 1   hydrALAZINE (APRESOLINE) 50 MG tablet Take 0.5 tablets (25 mg total) by mouth 3 (three) times daily. 270 tablet 3   HYDROcodone-acetaminophen (NORCO/VICODIN) 5-325 MG tablet Take 1-2 tablets by mouth at bedtime. 60 tablet 0   losartan (COZAAR) 50 MG tablet Take 1 tablet (50 mg total) by mouth daily. 90 tablet 1   magnesium gluconate (MAGONATE) 500 MG tablet Take 500 mg by mouth 2 (two) times daily.     nirmatrelvir/ritonavir, renal dosing, (PAXLOVID) 10 x 150 MG & 10 x '100MG'$  TABS Take 2 tablets by mouth 2 (two) times daily for 5 days. (Take nirmatrelvir 150 mg one tablet twice daily for 5 days and ritonavir 100 mg one tablet twice daily for 5 days) Patient GFR is 55.62 20 tablet 0   predniSONE (DELTASONE) 20 MG tablet Take 1 tablet  (20 mg total) by mouth daily with breakfast. 5 tablet 0   pregabalin (LYRICA) 75 MG capsule TAKE ONE CAPSULE BY MOUTH EVERY AFTERNOON AND TAKE TWO CAPSULES BY MOUTH EVERY NIGHT AT BEDTIME 270 capsule 0   promethazine-dextromethorphan (PROMETHAZINE-DM) 6.25-15 MG/5ML syrup Take 5 mLs by mouth 4 (four) times daily as needed for cough. 150 mL 0   rOPINIRole (REQUIP) 0.5 MG tablet 2 TABLET IN THE AFTERNOON , AND 3 TABLETS AT BEDTIME 360 tablet 1   Semaglutide, 2 MG/DOSE, 8 MG/3ML SOPN Inject 2 mg as directed once a week. 3 mL 5   simvastatin (ZOCOR) 40 MG tablet TAKE ONE TABLET BY MOUTH EVERY NIGHT AT BEDTIME 90 tablet 1   spironolactone (ALDACTONE) 25 MG tablet Take 0.5 tablets (12.5 mg total) by mouth daily. 90 tablet 3   No current facility-administered medications on file prior to visit.    Review of Systems     Objective:  There were no vitals filed for this visit. BP Readings from Last 3 Encounters:  06/05/22 (!) 144/76  05/18/22 118/72  04/21/22 (!) 142/67   Wt Readings from Last 3 Encounters:  06/05/22 213 lb 2 oz (96.7 kg)  05/18/22 211 lb (95.7 kg)  04/21/22 210 lb  15.7 oz (95.7 kg)   There is no height or weight on file to calculate BMI.    Physical Exam         Assessment & Plan:    See Problem List for Assessment and Plan of chronic medical problems.

## 2022-06-11 ENCOUNTER — Encounter: Payer: Self-pay | Admitting: Internal Medicine

## 2022-06-11 ENCOUNTER — Ambulatory Visit (INDEPENDENT_AMBULATORY_CARE_PROVIDER_SITE_OTHER): Payer: Medicare PPO | Admitting: Internal Medicine

## 2022-06-11 ENCOUNTER — Telehealth: Payer: Self-pay

## 2022-06-11 ENCOUNTER — Ambulatory Visit (INDEPENDENT_AMBULATORY_CARE_PROVIDER_SITE_OTHER): Payer: Medicare PPO

## 2022-06-11 VITALS — BP 138/70 | HR 75 | Temp 98.2°F | Ht 66.0 in

## 2022-06-11 DIAGNOSIS — R062 Wheezing: Secondary | ICD-10-CM

## 2022-06-11 DIAGNOSIS — J45901 Unspecified asthma with (acute) exacerbation: Secondary | ICD-10-CM | POA: Insufficient documentation

## 2022-06-11 DIAGNOSIS — R059 Cough, unspecified: Secondary | ICD-10-CM | POA: Diagnosis not present

## 2022-06-11 DIAGNOSIS — J4541 Moderate persistent asthma with (acute) exacerbation: Secondary | ICD-10-CM

## 2022-06-11 DIAGNOSIS — R051 Acute cough: Secondary | ICD-10-CM

## 2022-06-11 DIAGNOSIS — U071 COVID-19: Secondary | ICD-10-CM | POA: Diagnosis not present

## 2022-06-11 DIAGNOSIS — R0602 Shortness of breath: Secondary | ICD-10-CM | POA: Diagnosis not present

## 2022-06-11 MED ORDER — PREDNISONE 20 MG PO TABS: 40.0000 mg | ORAL_TABLET | Freq: Every day | ORAL | 0 refills | Status: AC

## 2022-06-11 MED ORDER — IPRATROPIUM-ALBUTEROL 0.5-2.5 (3) MG/3ML IN SOLN
3.0000 mL | Freq: Once | RESPIRATORY_TRACT | Status: AC
  Administered 2022-06-11: 3 mL via RESPIRATORY_TRACT

## 2022-06-11 MED ORDER — METHYLPREDNISOLONE ACETATE 80 MG/ML IJ SUSP
80.0000 mg | Freq: Once | INTRAMUSCULAR | Status: AC
  Administered 2022-06-11: 80 mg via INTRAMUSCULAR

## 2022-06-11 MED ORDER — ALBUTEROL SULFATE HFA 108 (90 BASE) MCG/ACT IN AERS: 2.0000 | INHALATION_SPRAY | Freq: Four times a day (QID) | RESPIRATORY_TRACT | 0 refills | Status: DC | PRN

## 2022-06-11 NOTE — Assessment & Plan Note (Addendum)
Acute Secondary to covid with relentless cough, has diffuse exp wheeze on exam I do not think she has PNA but will get a CXR to be sure Duoneb treatment here today Depo-medrol 80 mg IM x 1 Start prednisone 40 mg daily x 5 days tomorrow No need for abx Continue albuterol Q 6 hrs prn Cough syrup

## 2022-06-11 NOTE — Addendum Note (Signed)
Addended by: Marcina Millard on: 06/11/2022 03:57 PM   Modules accepted: Orders

## 2022-06-11 NOTE — Patient Instructions (Addendum)
    You have a nebulizer treatment today and a steroid injection.    Medications changes include :   prednisone 40 mg daily x 5 days - start this tomorrow    Use the albuterol inhaler.      Return if symptoms worsen or fail to improve.

## 2022-06-11 NOTE — Telephone Encounter (Signed)
Left message for patient to call back to schedule Medicare Annual Wellness Visit   Last AWV  06/15/17  Please schedule at anytime with LB Trumann if patient calls the office back.    30 Minutes appointment   Any questions, please call me at [phone]

## 2022-06-11 NOTE — Assessment & Plan Note (Signed)
Acute Secondary to covid with relentless and diffuse exp wheeze on exam I do not think she has PNA but will get a CXR to be sure Duoneb treatment here today Depo-medrol 80 mg IM x 1 Start prednisone 40 mg daily x 5 days tomorrow No need for abx Continue albuterol Q 6 hrs prn Cough syrup

## 2022-06-11 NOTE — Addendum Note (Signed)
Addended by: Marcina Millard on: 06/11/2022 03:49 PM   Modules accepted: Orders

## 2022-06-17 ENCOUNTER — Encounter: Payer: Self-pay | Admitting: Internal Medicine

## 2022-06-17 MED ORDER — PREDNISONE 10 MG PO TABS: ORAL_TABLET | ORAL | 0 refills | Status: DC

## 2022-06-19 MED ORDER — ALBUTEROL SULFATE (2.5 MG/3ML) 0.083% IN NEBU: 2.5000 mg | INHALATION_SOLUTION | Freq: Four times a day (QID) | RESPIRATORY_TRACT | 5 refills | Status: DC | PRN

## 2022-06-19 NOTE — Addendum Note (Signed)
Addended by: Binnie Rail on: 06/19/2022 07:57 AM   Modules accepted: Orders

## 2022-06-22 ENCOUNTER — Other Ambulatory Visit: Payer: Self-pay | Admitting: Cardiovascular Disease

## 2022-06-28 ENCOUNTER — Encounter: Payer: Self-pay | Admitting: Internal Medicine

## 2022-06-28 DIAGNOSIS — M545 Low back pain, unspecified: Secondary | ICD-10-CM

## 2022-06-28 MED ORDER — HYDROCODONE-ACETAMINOPHEN 5-325 MG PO TABS: 1.0000 | ORAL_TABLET | Freq: Every day | ORAL | 0 refills | Status: DC

## 2022-06-29 ENCOUNTER — Other Ambulatory Visit: Payer: Self-pay | Admitting: Internal Medicine

## 2022-06-29 DIAGNOSIS — G4733 Obstructive sleep apnea (adult) (pediatric): Secondary | ICD-10-CM | POA: Diagnosis not present

## 2022-06-29 DIAGNOSIS — I10 Essential (primary) hypertension: Secondary | ICD-10-CM | POA: Diagnosis not present

## 2022-06-29 DIAGNOSIS — G2581 Restless legs syndrome: Secondary | ICD-10-CM | POA: Diagnosis not present

## 2022-06-29 DIAGNOSIS — G4723 Circadian rhythm sleep disorder, irregular sleep wake type: Secondary | ICD-10-CM | POA: Diagnosis not present

## 2022-07-11 ENCOUNTER — Other Ambulatory Visit: Payer: Self-pay | Admitting: Internal Medicine

## 2022-07-11 DIAGNOSIS — E119 Type 2 diabetes mellitus without complications: Secondary | ICD-10-CM

## 2022-07-24 ENCOUNTER — Encounter: Payer: Self-pay | Admitting: Internal Medicine

## 2022-07-24 DIAGNOSIS — M545 Low back pain, unspecified: Secondary | ICD-10-CM

## 2022-07-27 ENCOUNTER — Encounter: Payer: Self-pay | Admitting: Internal Medicine

## 2022-07-27 MED ORDER — HYDROCODONE-ACETAMINOPHEN 5-325 MG PO TABS: 1.0000 | ORAL_TABLET | Freq: Every day | ORAL | 0 refills | Status: DC

## 2022-07-28 ENCOUNTER — Encounter: Payer: Self-pay | Admitting: Internal Medicine

## 2022-07-28 NOTE — Telephone Encounter (Signed)
Pt need PA on her Horizant . Will forward msg to Prior Team../lmb

## 2022-07-29 ENCOUNTER — Other Ambulatory Visit (HOSPITAL_COMMUNITY): Payer: Self-pay

## 2022-07-29 ENCOUNTER — Telehealth: Payer: Self-pay

## 2022-07-29 MED ORDER — HORIZANT 600 MG PO TBCR: 1.0000 | EXTENDED_RELEASE_TABLET | Freq: Every day | ORAL | 0 refills | Status: DC

## 2022-07-29 NOTE — Telephone Encounter (Signed)
Submitted w/ (Key: [id number]) PA information has been sent to [facility].Marland KitchenJohny Roach

## 2022-07-29 NOTE — Telephone Encounter (Signed)
Patient Advocate Encounter  Prior Authorization for Horizant 600MG  er tablets has been approved.    Valid through 05/11/23

## 2022-07-29 NOTE — Telephone Encounter (Signed)
Pharmacy Patient Advocate Encounter   Received notification from Kristopher Oppenheim that prior authorization for Horizant 600MG  er tablets is required/requested.  Per Test Claim: PA required   PA submitted on 07/29/22 to (ins) [employer] via CoverMyMeds Key or (Medicaid) confirmation # [id number] Status is pending

## 2022-07-29 NOTE — Telephone Encounter (Signed)
   PA was resubmitted by RMA. Key: [id number]

## 2022-08-03 ENCOUNTER — Other Ambulatory Visit: Payer: Self-pay | Admitting: Internal Medicine

## 2022-08-04 DIAGNOSIS — G4733 Obstructive sleep apnea (adult) (pediatric): Secondary | ICD-10-CM | POA: Diagnosis not present

## 2022-08-04 DIAGNOSIS — G2581 Restless legs syndrome: Secondary | ICD-10-CM | POA: Diagnosis not present

## 2022-08-04 DIAGNOSIS — G4700 Insomnia, unspecified: Secondary | ICD-10-CM | POA: Diagnosis not present

## 2022-08-05 ENCOUNTER — Encounter: Payer: Self-pay | Admitting: Internal Medicine

## 2022-08-05 DIAGNOSIS — G2581 Restless legs syndrome: Secondary | ICD-10-CM

## 2022-08-05 DIAGNOSIS — G6289 Other specified polyneuropathies: Secondary | ICD-10-CM

## 2022-08-12 NOTE — Addendum Note (Signed)
Addended by: Binnie Rail on: 08/12/2022 09:21 PM   Modules accepted: Orders

## 2022-08-13 DIAGNOSIS — G4723 Circadian rhythm sleep disorder, irregular sleep wake type: Secondary | ICD-10-CM | POA: Diagnosis not present

## 2022-08-13 DIAGNOSIS — G2581 Restless legs syndrome: Secondary | ICD-10-CM | POA: Diagnosis not present

## 2022-08-13 DIAGNOSIS — G4733 Obstructive sleep apnea (adult) (pediatric): Secondary | ICD-10-CM | POA: Diagnosis not present

## 2022-08-13 DIAGNOSIS — I10 Essential (primary) hypertension: Secondary | ICD-10-CM | POA: Diagnosis not present

## 2022-08-24 ENCOUNTER — Encounter: Payer: Self-pay | Admitting: Internal Medicine

## 2022-08-24 DIAGNOSIS — M545 Low back pain, unspecified: Secondary | ICD-10-CM

## 2022-08-25 MED ORDER — HYDROCODONE-ACETAMINOPHEN 5-325 MG PO TABS: 1.0000 | ORAL_TABLET | Freq: Every day | ORAL | 0 refills | Status: DC

## 2022-08-26 ENCOUNTER — Encounter: Payer: Self-pay | Admitting: Internal Medicine

## 2022-08-27 ENCOUNTER — Encounter: Payer: Self-pay | Admitting: Internal Medicine

## 2022-08-27 DIAGNOSIS — G2581 Restless legs syndrome: Secondary | ICD-10-CM

## 2022-09-01 ENCOUNTER — Encounter: Payer: Self-pay | Admitting: Physical Medicine and Rehabilitation

## 2022-09-02 DIAGNOSIS — I739 Peripheral vascular disease, unspecified: Secondary | ICD-10-CM | POA: Diagnosis not present

## 2022-09-02 DIAGNOSIS — B351 Tinea unguium: Secondary | ICD-10-CM | POA: Diagnosis not present

## 2022-09-02 DIAGNOSIS — L603 Nail dystrophy: Secondary | ICD-10-CM | POA: Diagnosis not present

## 2022-09-17 DIAGNOSIS — G2581 Restless legs syndrome: Secondary | ICD-10-CM | POA: Diagnosis not present

## 2022-09-17 DIAGNOSIS — G4733 Obstructive sleep apnea (adult) (pediatric): Secondary | ICD-10-CM | POA: Diagnosis not present

## 2022-09-17 DIAGNOSIS — G4723 Circadian rhythm sleep disorder, irregular sleep wake type: Secondary | ICD-10-CM | POA: Diagnosis not present

## 2022-09-17 DIAGNOSIS — I10 Essential (primary) hypertension: Secondary | ICD-10-CM | POA: Diagnosis not present

## 2022-09-20 ENCOUNTER — Other Ambulatory Visit: Payer: Self-pay | Admitting: Cardiovascular Disease

## 2022-09-25 NOTE — Addendum Note (Signed)
Addended by: Pincus Sanes on: 09/25/2022 12:38 PM   Modules accepted: Orders

## 2022-09-26 ENCOUNTER — Encounter (HOSPITAL_BASED_OUTPATIENT_CLINIC_OR_DEPARTMENT_OTHER): Payer: Self-pay

## 2022-09-26 ENCOUNTER — Other Ambulatory Visit: Payer: Self-pay

## 2022-09-26 ENCOUNTER — Emergency Department (HOSPITAL_BASED_OUTPATIENT_CLINIC_OR_DEPARTMENT_OTHER): Payer: Medicare PPO

## 2022-09-26 ENCOUNTER — Emergency Department (HOSPITAL_BASED_OUTPATIENT_CLINIC_OR_DEPARTMENT_OTHER)
Admission: EM | Admit: 2022-09-26 | Discharge: 2022-09-26 | Disposition: A | Discharge status: Home / Self Care | Payer: Medicare PPO | Attending: Emergency Medicine | Admitting: Emergency Medicine

## 2022-09-26 DIAGNOSIS — N3000 Acute cystitis without hematuria: Secondary | ICD-10-CM

## 2022-09-26 DIAGNOSIS — D271 Benign neoplasm of left ovary: Secondary | ICD-10-CM | POA: Insufficient documentation

## 2022-09-26 DIAGNOSIS — R531 Weakness: Secondary | ICD-10-CM

## 2022-09-26 DIAGNOSIS — Z7982 Long term (current) use of aspirin: Secondary | ICD-10-CM | POA: Diagnosis not present

## 2022-09-26 DIAGNOSIS — K5730 Diverticulosis of large intestine without perforation or abscess without bleeding: Secondary | ICD-10-CM | POA: Diagnosis not present

## 2022-09-26 DIAGNOSIS — Z1152 Encounter for screening for COVID-19: Secondary | ICD-10-CM | POA: Diagnosis not present

## 2022-09-26 DIAGNOSIS — M6281 Muscle weakness (generalized): Secondary | ICD-10-CM | POA: Insufficient documentation

## 2022-09-26 DIAGNOSIS — D279 Benign neoplasm of unspecified ovary: Secondary | ICD-10-CM | POA: Diagnosis not present

## 2022-09-26 LAB — RETICULOCYTES
Immature Retic Fract: 15.5 % (ref 2.3–15.9)
RBC.: 4.37 MIL/uL (ref 3.87–5.11)
Retic Count, Absolute: 107.9 10*3/uL (ref 19.0–186.0)
Retic Ct Pct: 2.5 % (ref 0.4–3.1)

## 2022-09-26 LAB — CBC
HCT: 37.8 % (ref 36.0–46.0)
Hemoglobin: 13.4 g/dL (ref 12.0–15.0)
MCH: 30.8 pg (ref 26.0–34.0)
MCHC: 35.4 g/dL (ref 30.0–36.0)
MCV: 86.9 fL (ref 80.0–100.0)
Platelets: 289 10*3/uL (ref 150–400)
RBC: 4.35 MIL/uL (ref 3.87–5.11)
RDW: 13.4 % (ref 11.5–15.5)
WBC: 11.3 10*3/uL — ABNORMAL HIGH (ref 4.0–10.5)
nRBC: 0 % (ref 0.0–0.2)

## 2022-09-26 LAB — CBG MONITORING, ED: Glucose-Capillary: 96 mg/dL (ref 70–99)

## 2022-09-26 LAB — COMPREHENSIVE METABOLIC PANEL
ALT: 21 U/L (ref 0–44)
AST: 15 U/L (ref 15–41)
Albumin: 4.4 g/dL (ref 3.5–5.0)
Alkaline Phosphatase: 56 U/L (ref 38–126)
Anion gap: 12 (ref 5–15)
BUN: 21 mg/dL (ref 8–23)
CO2: 20 mmol/L — ABNORMAL LOW (ref 22–32)
Calcium: 9.5 mg/dL (ref 8.9–10.3)
Chloride: 106 mmol/L (ref 98–111)
Creatinine, Ser: 1.12 mg/dL — ABNORMAL HIGH (ref 0.44–1.00)
GFR, Estimated: 50 mL/min — ABNORMAL LOW (ref >60)
Glucose, Bld: 111 mg/dL — ABNORMAL HIGH (ref 70–99)
Potassium: 4.2 mmol/L (ref 3.5–5.1)
Sodium: 138 mmol/L (ref 135–145)
Total Bilirubin: 0.5 mg/dL (ref 0.3–1.2)
Total Protein: 7.4 g/dL (ref 6.5–8.1)

## 2022-09-26 LAB — URINALYSIS, ROUTINE W REFLEX MICROSCOPIC
Bilirubin Urine: NEGATIVE
Glucose, UA: NEGATIVE mg/dL
Hgb urine dipstick: NEGATIVE
Ketones, ur: NEGATIVE mg/dL
Nitrite: POSITIVE — AB
Specific Gravity, Urine: 1.031 — ABNORMAL HIGH (ref 1.005–1.030)
pH: 6 (ref 5.0–8.0)

## 2022-09-26 LAB — SARS CORONAVIRUS 2 BY RT PCR: SARS Coronavirus 2 by RT PCR: NEGATIVE

## 2022-09-26 LAB — TSH: TSH: 2.281 u[IU]/mL (ref 0.350–4.500)

## 2022-09-26 LAB — IRON AND TIBC
Iron: 126 ug/dL (ref 28–170)
Saturation Ratios: 31 % (ref 10.4–31.8)
TIBC: 402 ug/dL (ref 250–450)
UIBC: 276 ug/dL

## 2022-09-26 LAB — FOLATE: Folate: 15.2 ng/mL (ref >5.9)

## 2022-09-26 LAB — FERRITIN: Ferritin: 88 ng/mL (ref 11–307)

## 2022-09-26 LAB — T4, FREE: Free T4: 0.64 ng/dL (ref 0.61–1.12)

## 2022-09-26 LAB — TROPONIN I (HIGH SENSITIVITY): Troponin I (High Sensitivity): 8 ng/L (ref <18)

## 2022-09-26 LAB — VITAMIN B12: Vitamin B-12: 249 pg/mL (ref 180–914)

## 2022-09-26 MED ORDER — IOHEXOL 300 MG/ML  SOLN
100.0000 mL | Freq: Once | INTRAMUSCULAR | Status: AC | PRN
  Administered 2022-09-26: 80 mL via INTRAVENOUS

## 2022-09-26 MED ORDER — SODIUM CHLORIDE 0.9 % IV BOLUS
1000.0000 mL | Freq: Once | INTRAVENOUS | Status: AC
  Administered 2022-09-26: 1000 mL via INTRAVENOUS

## 2022-09-26 MED ORDER — HYDROCODONE-ACETAMINOPHEN 5-325 MG PO TABS
1.0000 | ORAL_TABLET | Freq: Once | ORAL | Status: AC
  Administered 2022-09-26: 1 via ORAL
  Filled 2022-09-26: qty 1

## 2022-09-26 MED ORDER — CEPHALEXIN 500 MG PO CAPS: 500.0000 mg | ORAL_CAPSULE | Freq: Two times a day (BID) | ORAL | 0 refills | Status: AC

## 2022-09-26 MED ORDER — CEPHALEXIN 250 MG PO CAPS
500.0000 mg | ORAL_CAPSULE | Freq: Once | ORAL | Status: AC
  Administered 2022-09-26: 500 mg via ORAL
  Filled 2022-09-26: qty 2

## 2022-09-26 NOTE — ED Notes (Signed)
Transported to ct 

## 2022-09-26 NOTE — ED Notes (Signed)
Dc instructions reviewed with patient. Patient voiced understanding. Dc with belongings.  °

## 2022-09-26 NOTE — ED Triage Notes (Signed)
Patient here POV from Home.  Endorses Weakness, N/V over 2 Days. No Pain, No SOB, No Fever, No Bleeding noted.   NAD Noted during Triage. A&Ox4. GCS 15. BIB Wheelchair.

## 2022-09-26 NOTE — Discharge Instructions (Addendum)
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  This appears similar and unchanged from prior imaging, including in 2010.  You can discuss this with your OB/GYN provider or your primary care physician.

## 2022-09-26 NOTE — ED Notes (Signed)
Pt unable to void at this time. Will give sprite and initiate saline ivf.

## 2022-09-26 NOTE — ED Provider Notes (Signed)
[place] EMERGENCY DEPARTMENT AT [facility] Provider Note   CSN: [id number] Arrival date & time: 09/26/22  1539     History  Chief Complaint  Patient presents with   Weakness    Erin Roach is a 82 y.o. female with history of restless leg syndrome, chronic pain, presented to the ED with generalized weakness.  Patient reports abrupt onset of entire body weakness about 2 to 3 days ago.  She is she has no energy to get up.  She also reports very poor appetite, which is a sudden change.  She reports nausea anytime she tries to eat.  She is having regular bowel movements.  Reports a distant history of diverticulosis and diverticulitis.  Denies any history of abdominal surgeries.  She denies any chest pain or pressure.  She denies any cough or respiratory symptoms.  She reports a very mild headache.  She denies any sick contacts in the house  She reports no new medications.  She has been on chronic pain medicine for a long time with Norco and Lyrica, without any significant changes.  HPI     Home Medications Prior to Admission medications   Medication Sig Start Date End Date Taking? Authorizing Provider  cephALEXin (KEFLEX) 500 MG capsule Take 1 capsule (500 mg total) by mouth 2 (two) times daily for 7 days. 09/27/22 10/04/22 Yes Neshawn Aird, Kermit Balo, MD  albuterol (PROVENTIL) (2.5 MG/3ML) 0.083% nebulizer solution Take 3 mLs (2.5 mg total) by nebulization every 6 (six) hours as needed for wheezing or shortness of breath. 06/19/22   Pincus Sanes, MD  albuterol (VENTOLIN HFA) 108 (90 Base) MCG/ACT inhaler Inhale 2 puffs into the lungs every 6 (six) hours as needed for wheezing or shortness of breath. 06/11/22   Pincus Sanes, MD  aspirin EC 81 MG tablet Take 1 tablet (81 mg total) by mouth daily. Swallow whole. 11/05/21   Pincus Sanes, MD  cholecalciferol (VITAMIN D3) 25 MCG (1000 UNIT) tablet Take 1,000 Units by mouth daily.    [provider]  co-enzyme Q-10 30 MG  capsule Take 30 mg by mouth daily.    [provider]  Eszopiclone 3 MG TABS Take 1 tablet (3 mg total) by mouth at bedtime. Take immediately before bedtime 05/18/22   Pincus Sanes, MD  ezetimibe (ZETIA) 10 MG tablet TAKE ONE TABLET BY MOUTH DAILY 01/08/22   Croitoru, Rachelle Hora, MD  furosemide (LASIX) 20 MG tablet Take 2 tablets (40 mg total) by mouth daily. 05/18/22   Burns, Bobette Mo, MD  HORIZANT 600 MG TBCR Take 1 tablet (600 mg total) by mouth at bedtime. 07/29/22   Plotnikov, Georgina Quint, MD  hydrALAZINE (APRESOLINE) 50 MG tablet TAKE ONE TABLET BY MOUTH THREE TIMES A DAY 09/21/22   Bernadene Person C, NP  HYDROcodone-acetaminophen (NORCO/VICODIN) 5-325 MG tablet Take 1-2 tablets by mouth at bedtime. 08/25/22   Pincus Sanes, MD  losartan (COZAAR) 50 MG tablet Take 1 tablet (50 mg total) by mouth daily. 05/21/22   Pincus Sanes, MD  magnesium gluconate (MAGONATE) 500 MG tablet Take 500 mg by mouth 2 (two) times daily.    [provider]  predniSONE (DELTASONE) 10 MG tablet Take 4 tabs po qd x 2 days, then 3 tabs po qd x 2 days, then 2 tabs po qd x 2 days, then 1 tab po qd x 2 days 06/17/22   Pincus Sanes, MD  pregabalin (LYRICA) 75 MG capsule TAKE ONE CAPSULE  BY MOUTH EVERY AFTERNOON AND TAKE TWO CAPSULES BY MOUTH EVERY NIGHT AT BEDTIME 06/29/22   Pincus Sanes, MD  promethazine-dextromethorphan (PROMETHAZINE-DM) 6.25-15 MG/5ML syrup Take 5 mLs by mouth 4 (four) times daily as needed for cough. 06/10/22   Burns, Bobette Mo, MD  rOPINIRole (REQUIP) 0.5 MG tablet 2 TABLET IN THE AFTERNOON , AND 3 TABLETS AT BEDTIME 05/18/22   Burns, Bobette Mo, MD  Semaglutide, 2 MG/DOSE, 8 MG/3ML SOPN Inject 2 mg as directed once a week. 05/21/22   Pincus Sanes, MD  simvastatin (ZOCOR) 40 MG tablet TAKE ONE TABLET BY MOUTH EVERY NIGHT AT BEDTIME 08/03/22   Pincus Sanes, MD  spironolactone (ALDACTONE) 25 MG tablet TAKE ONE TABLET BY MOUTH DAILY 06/22/22   Croitoru, Rachelle Hora, MD      Allergies    Patient has no known  allergies.    Review of Systems   Review of Systems  Physical Exam Updated Vital Signs BP (!) 153/93   Pulse 96   Temp 98.6 F (37 C) (Oral)   Resp 20   Ht 5\' 6"  (1.676 m)   Wt 95.3 kg   SpO2 99%   BMI 33.89 kg/m  Physical Exam Constitutional:      General: She is not in acute distress. HENT:     Head: Normocephalic and atraumatic.  Eyes:     Conjunctiva/sclera: Conjunctivae normal.     Pupils: Pupils are equal, round, and reactive to light.  Cardiovascular:     Rate and Rhythm: Normal rate and regular rhythm.  Pulmonary:     Effort: Pulmonary effort is normal. No respiratory distress.  Abdominal:     General: There is no distension.     Tenderness: There is no abdominal tenderness.  Skin:    General: Skin is warm and dry.  Neurological:     General: No focal deficit present.     Mental Status: She is alert and oriented to person, place, and time. Mental status is at baseline.  Psychiatric:        Mood and Affect: Mood normal.        Behavior: Behavior normal.     ED Results / Procedures / Treatments   Labs (all labs ordered are listed, but only abnormal results are displayed) Labs Reviewed  COMPREHENSIVE METABOLIC PANEL - Abnormal; Notable for the following components:      Result Value   CO2 20 (*)    Glucose, Bld 111 (*)    Creatinine, Ser 1.12 (*)    GFR, Estimated 50 (*)    All other components within normal limits  CBC - Abnormal; Notable for the following components:   WBC 11.3 (*)    All other components within normal limits  URINALYSIS, ROUTINE W REFLEX MICROSCOPIC - Abnormal; Notable for the following components:   Specific Gravity, Urine 1.031 (*)    Protein, ur TRACE (*)    Nitrite POSITIVE (*)    Leukocytes,Ua SMALL (*)    Bacteria, UA RARE (*)    All other components within normal limits  SARS CORONAVIRUS 2 BY RT PCR  URINE CULTURE  FERRITIN  RETICULOCYTES  TSH  VITAMIN B12  FOLATE  IRON AND TIBC  T4, FREE  CBG MONITORING, ED   TROPONIN I (HIGH SENSITIVITY)    EKG EKG Interpretation  Date/Time:  Saturday Sep 26 2022 15:53:47 EDT Ventricular Rate:  103 PR Interval:  158 QRS Duration: 122 QT Interval:  382 QTC Calculation: 500 R Axis:   -  81 Text Interpretation: Sinus tachycardia Right bundle branch block Left anterior fascicular block Abnormal ECG When compared with ECG of 23-Jan-2019 05:34, No sig change from prior tracing Confirmed by Alvester Chou [phone]) on 09/26/2022 4:22:26 PM  Radiology CT ABDOMEN PELVIS W CONTRAST  Result Date: 09/26/2022 CLINICAL DATA:  Diverticulitis complication suspected. Weakness, nausea, vomiting EXAM: CT ABDOMEN AND PELVIS WITH CONTRAST TECHNIQUE: Multidetector CT imaging of the abdomen and pelvis was performed using the standard protocol following bolus administration of intravenous contrast. RADIATION DOSE REDUCTION: This exam was performed according to the departmental dose-optimization program which includes automated exposure control, adjustment of the mA and/or kV according to patient size and/or use of iterative reconstruction technique. CONTRAST:  80mL OMNIPAQUE IOHEXOL 300 MG/ML  SOLN COMPARISON:  CT abdomen pelvis 07/31/2008 FINDINGS: Lower chest: No acute abnormality. Hepatobiliary: Hepatic cysts. Unremarkable gallbladder and biliary tree. Pancreas: Unremarkable. Spleen: Unremarkable. Adrenals/Urinary Tract: Normal adrenal glands. No urinary calculi or hydronephrosis. Unremarkable bladder. Stomach/Bowel: Normal caliber large and small bowel. Colonic diverticulosis without diverticulitis. No bowel wall thickening. Appendix is not visualized. Stomach is within normal limits. Vascular/Lymphatic: Aortic atherosclerosis. No enlarged abdominal or pelvic lymph nodes. Reproductive: Unremarkable uterus. 3.8 x 2.8 cm lesion in the left ovary containing macroscopic fat and coarse calcifications compatible with teratoma. Unremarkable right ovary. Other: No free intraperitoneal fluid or air.  Musculoskeletal: Posterior fusion L4-S1. No acute osseous abnormality. IMPRESSION: 1. No acute abdominopelvic process. 2.  Colonic diverticulosis without diverticulitis. 3. 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 Aortic Atherosclerosis (ICD10-I70.0). Electronically Signed   By: Minerva Fester M.D.   On: 09/26/2022 17:31    Procedures Procedures    Medications Ordered in ED Medications  cephALEXin (KEFLEX) capsule 500 mg (has no administration in time range)  sodium chloride 0.9 % bolus 1,000 mL (1,000 mLs Intravenous New Bag/Given 09/26/22 1707)  iohexol (OMNIPAQUE) 300 MG/ML solution 100 mL (80 mLs Intravenous Contrast Given 09/26/22 1709)  HYDROcodone-acetaminophen (NORCO/VICODIN) 5-325 MG per tablet 1 tablet (1 tablet Oral Given 09/26/22 1751)    ED Course/ Medical Decision Making/ A&P Clinical Course as of 09/26/22 1854  Sat Sep 26, 2022  1845 Patient reassessed and reports that she is generally feeling much better after the IV fluids.  Have not identified any emergent conditions on her workup, although the urine does appear to be concentrated with high specific gravity, and there is evidence of UTI.  We will put her on antibiotics for this, and have her follow-up with PCP.  She verbalized understanding [MT]    Clinical Course User Index [MT] Markeisha Mancias, Kermit Balo, MD                             Medical Decision Making Amount and/or Complexity of Data Reviewed Labs: ordered. Radiology: ordered.  Risk Prescription drug management.   This patient presents to the ED with concern for generalized weakness, 3 days. This involves an extensive number of treatment options, and is a complaint that carries with it a high risk of complications and morbidity.  The differential diagnosis includes anemia versus dehydration versus atypical ACS versus diverticulitis versus colitis versus viral illness versus other  Additional history obtained from patient's sister and husband at the  bedside   I ordered and personally interpreted labs.  The pertinent results include: Labs overall reassuring.  There is some minor leukocytosis white blood cell count 11.3, with some concentration of blood in the urine suggestive of mild dehydration.  Creatinine  1.1.  Blood sugar within normal limits.  No acute anemia.  I ordered imaging studies including CT abdomen pelvis I independently visualized and interpreted imaging which showed diverticulosis and chronic left ovarian teratoma, unchanged from prior imaging.  No other emergent findings I agree with the radiologist interpretation  The patient was maintained on a cardiac monitor.  I personally viewed and interpreted the cardiac monitored which showed an underlying rhythm of: Sinus rhythm and sinus tachycardia  Per my interpretation the patient's ECG shows sinus rhythm with chronic bifascicular block pattern without acute ischemic findings  I ordered medication including Norco for chronic home pain management.  Keflex for UTI.  IV fluids for hydration  I have reviewed the patients home medicines and have made adjustments as needed  Test Considered: Very low suspicion for acute PE, aortic dissection.  Doubt ACS.  No indication for CT imaging of the chest.  No respiratory symptoms suggest pneumonia   After the interventions noted above, I reevaluated the patient and found that they have: improved   Dispostion:  After consideration of the diagnostic results and the patients response to treatment, I feel that the patent would benefit from outpatient PCP follow up..         Final Clinical Impression(s) / ED Diagnoses Final diagnoses:  Weakness  Acute cystitis without hematuria  Ovarian teratoma, left    Rx / DC Orders ED Discharge Orders          Ordered    cephALEXin (KEFLEX) 500 MG capsule  2 times daily        09/26/22 1846              Rhen Dossantos, Kermit Balo, MD 09/26/22 [id number]

## 2022-09-27 ENCOUNTER — Encounter: Payer: Self-pay | Admitting: Internal Medicine

## 2022-09-27 DIAGNOSIS — M545 Low back pain, unspecified: Secondary | ICD-10-CM

## 2022-09-28 LAB — URINE CULTURE

## 2022-09-28 MED ORDER — HYDROCODONE-ACETAMINOPHEN 5-325 MG PO TABS
1.0000 | ORAL_TABLET | Freq: Every day | ORAL | 0 refills | Status: DC
Start: 2022-09-28 — End: 2022-10-29

## 2022-09-29 LAB — URINE CULTURE: Culture: >=100000 — AB

## 2022-09-30 ENCOUNTER — Encounter: Payer: Self-pay | Admitting: Internal Medicine

## 2022-09-30 NOTE — Telephone Encounter (Signed)
Post ED Visit - Positive Culture Follow-up  Culture report reviewed by antimicrobial stewardship pharmacist: [facility] [x]  [facility], Pharm.D. []  Celedonio Miyamoto, Pharm.D., BCPS AQ-ID []  Garvin Fila, Pharm.D., BCPS []  Georgina Pillion, [address].D., BCPS []  [place], [place].D., BCPS, AAHIVP []  Estella Husk, Pharm.D., BCPS, AAHIVP []  Lysle Pearl, PharmD, BCPS []  Phillips Climes, PharmD, BCPS []  Agapito Games, PharmD, BCPS []  Verlan Friends, PharmD []  Mervyn Gay, PharmD, BCPS []  Vinnie Level, PharmD  [facility] []  Len Childs, PharmD []  Greer Pickerel, PharmD []  Adalberto Cole, PharmD []  Perlie Gold, Rph []  Lonell Face) Jean Rosenthal, PharmD []  Earl Many, PharmD []  Junita Push, PharmD []  Dorna Leitz, PharmD []  Terrilee Files, PharmD []  Lynann Beaver, PharmD []  Keturah Barre, PharmD []  Loralee Pacas, PharmD []  Bernadene Person, PharmD   Positive Urine culture Treated with Cephalexin, organism sensitive to the same and no further patient follow-up is required at this time.  Nena Polio Garner Nash 09/30/2022, 2:00 PM

## 2022-10-01 ENCOUNTER — Encounter: Payer: Self-pay | Admitting: Internal Medicine

## 2022-10-01 ENCOUNTER — Encounter: Payer: Medicare PPO | Admitting: Physical Medicine and Rehabilitation

## 2022-10-01 ENCOUNTER — Other Ambulatory Visit: Payer: Self-pay | Admitting: Internal Medicine

## 2022-10-01 DIAGNOSIS — N3000 Acute cystitis without hematuria: Secondary | ICD-10-CM | POA: Insufficient documentation

## 2022-10-01 NOTE — Patient Instructions (Addendum)
    B12 injection today   Medications changes include :   start B12 1000 mcg daily

## 2022-10-01 NOTE — Progress Notes (Signed)
Subjective:    Patient ID: Erin Roach, female    DOB: 1940-09-27, 82 y.o.   MRN: [id number]     HPI Mikaylin is here for follow up from the ED   5/18 - went for gen weakness.    Stated 2-3 days of total body weakness, dec appetite and nausea with eating.   UA concerning for uti,  labs, EKG ,Ct ab/p unremarkable except gfr slightly low. Urine cx > 100000 ecoli.  Rx'd keflex which she completes today.  She never had and does not have any urine symptoms.   Exhaustion - intermittent. Not constant.   Inspire not working greatly.  Sleep is a little better.    RLS is worse.  Some days legs hurt so much    Medications and allergies reviewed with patient and updated if appropriate.  Current Outpatient Medications on File Prior to Visit  Medication Sig Dispense Refill   albuterol (PROVENTIL) (2.5 MG/3ML) 0.083% nebulizer solution Take 3 mLs (2.5 mg total) by nebulization every 6 (six) hours as needed for wheezing or shortness of breath. 150 mL 5   albuterol (VENTOLIN HFA) 108 (90 Base) MCG/ACT inhaler Inhale 2 puffs into the lungs every 6 (six) hours as needed for wheezing or shortness of breath. 8 g 0   aspirin EC 81 MG tablet Take 1 tablet (81 mg total) by mouth daily. Swallow whole. 30 tablet 12   cephALEXin (KEFLEX) 500 MG capsule Take 1 capsule (500 mg total) by mouth 2 (two) times daily for 7 days. 14 capsule 0   cholecalciferol (VITAMIN D3) 25 MCG (1000 UNIT) tablet Take 1,000 Units by mouth daily.     co-enzyme Q-10 30 MG capsule Take 30 mg by mouth daily.     Eszopiclone 3 MG TABS Take 1 tablet (3 mg total) by mouth at bedtime. Take immediately before bedtime 30 tablet 5   ezetimibe (ZETIA) 10 MG tablet TAKE ONE TABLET BY MOUTH DAILY 90 tablet 3   furosemide (LASIX) 20 MG tablet Take 2 tablets (40 mg total) by mouth daily. 90 tablet 3   HORIZANT 600 MG TBCR Take 1 tablet (600 mg total) by mouth at bedtime. 90 tablet 0   hydrALAZINE (APRESOLINE) 50 MG tablet TAKE ONE TABLET  BY MOUTH THREE TIMES A DAY 270 tablet 2   HYDROcodone-acetaminophen (NORCO/VICODIN) 5-325 MG tablet Take 1-2 tablets by mouth at bedtime. 60 tablet 0   losartan (COZAAR) 50 MG tablet Take 1 tablet (50 mg total) by mouth daily. 90 tablet 1   magnesium gluconate (MAGONATE) 500 MG tablet Take 500 mg by mouth 2 (two) times daily.     predniSONE (DELTASONE) 10 MG tablet Take 4 tabs po qd x 2 days, then 3 tabs po qd x 2 days, then 2 tabs po qd x 2 days, then 1 tab po qd x 2 days 20 tablet 0   pregabalin (LYRICA) 75 MG capsule TAKE ONE CAPSULE BY MOUTH EVERY AFTERNOON AND TAKE TWO CAPSULES BY MOUTH EVERY NIGHT AT BEDTIME 270 capsule 0   promethazine-dextromethorphan (PROMETHAZINE-DM) 6.25-15 MG/5ML syrup Take 5 mLs by mouth 4 (four) times daily as needed for cough. 150 mL 0   rOPINIRole (REQUIP) 0.5 MG tablet 2 TABLET IN THE AFTERNOON , AND 3 TABLETS AT BEDTIME 360 tablet 1   Semaglutide, 2 MG/DOSE, 8 MG/3ML SOPN Inject 2 mg as directed once a week. 3 mL 5   simvastatin (ZOCOR) 40 MG tablet TAKE ONE TABLET BY MOUTH EVERY  NIGHT AT BEDTIME 90 tablet 1   spironolactone (ALDACTONE) 25 MG tablet TAKE ONE TABLET BY MOUTH DAILY 90 tablet 3   No current facility-administered medications on file prior to visit.     Review of Systems  Constitutional:  Positive for fatigue. Negative for fever.  Respiratory:  Negative for shortness of breath.   Cardiovascular:  Negative for chest pain and palpitations.  Gastrointestinal:  Negative for abdominal pain, constipation, diarrhea and nausea.  Genitourinary:  Negative for dysuria.  Neurological:  Positive for light-headedness. Negative for headaches.       Objective:   Vitals:   10/02/22 1352  BP: 108/62  Pulse: 90  Temp: 98.4 F (36.9 C)  SpO2: 90%   BP Readings from Last 3 Encounters:  10/02/22 108/62  09/26/22 (!) 168/52  06/11/22 138/70   Wt Readings from Last 3 Encounters:  10/02/22 214 lb (97.1 kg)  09/26/22 210 lb (95.3 kg)  06/05/22 213 lb 2  oz (96.7 kg)   Body mass index is 34.54 kg/m.    Physical Exam Constitutional:      General: She is not in acute distress.    Appearance: Normal appearance.  HENT:     Head: Normocephalic and atraumatic.  Eyes:     Conjunctiva/sclera: Conjunctivae normal.  Cardiovascular:     Rate and Rhythm: Normal rate and regular rhythm.     Heart sounds: Normal heart sounds.  Pulmonary:     Effort: Pulmonary effort is normal. No respiratory distress.     Breath sounds: Normal breath sounds. No wheezing.  Abdominal:     General: There is no distension.     Palpations: Abdomen is soft.     Tenderness: There is no abdominal tenderness.  Musculoskeletal:     Cervical back: Neck supple.     Right lower leg: No edema.     Left lower leg: No edema.  Lymphadenopathy:     Cervical: No cervical adenopathy.  Skin:    General: Skin is warm and dry.     Findings: No rash.  Neurological:     Mental Status: She is alert. Mental status is at baseline.  Psychiatric:        Mood and Affect: Mood normal.        Behavior: Behavior normal.        Lab Results  Component Value Date   WBC 11.3 (H) 09/26/2022   HGB 13.4 09/26/2022   HCT 37.8 09/26/2022   PLT 289 09/26/2022   GLUCOSE 111 (H) 09/26/2022   CHOL 191 05/18/2022   TRIG 231.0 (H) 05/18/2022   HDL 53.10 05/18/2022   LDLDIRECT 110.0 05/18/2022   LDLCALC 55 09/29/2021   ALT 21 09/26/2022   AST 15 09/26/2022   NA 138 09/26/2022   K 4.2 09/26/2022   CL 106 09/26/2022   CREATININE 1.12 (H) 09/26/2022   BUN 21 09/26/2022   CO2 20 (L) 09/26/2022   TSH 2.281 09/26/2022   INR 1.03 03/27/2015   HGBA1C 6.3 05/18/2022   MICROALBUR <0.7 12/05/2021     Assessment & Plan:    See Problem List for Assessment and Plan of chronic medical problems.

## 2022-10-02 ENCOUNTER — Encounter: Payer: Self-pay | Admitting: Internal Medicine

## 2022-10-02 ENCOUNTER — Ambulatory Visit: Payer: Medicare PPO | Admitting: Internal Medicine

## 2022-10-02 ENCOUNTER — Telehealth: Payer: Self-pay | Admitting: *Deleted

## 2022-10-02 VITALS — BP 108/62 | HR 90 | Temp 98.4°F | Ht 66.0 in | Wt 214.0 lb

## 2022-10-02 DIAGNOSIS — E538 Deficiency of other specified B group vitamins: Secondary | ICD-10-CM | POA: Diagnosis not present

## 2022-10-02 DIAGNOSIS — R5382 Chronic fatigue, unspecified: Secondary | ICD-10-CM | POA: Diagnosis not present

## 2022-10-02 DIAGNOSIS — N3000 Acute cystitis without hematuria: Secondary | ICD-10-CM

## 2022-10-02 DIAGNOSIS — R600 Localized edema: Secondary | ICD-10-CM

## 2022-10-02 DIAGNOSIS — G2581 Restless legs syndrome: Secondary | ICD-10-CM

## 2022-10-02 DIAGNOSIS — R7989 Other specified abnormal findings of blood chemistry: Secondary | ICD-10-CM | POA: Insufficient documentation

## 2022-10-02 MED ORDER — CYANOCOBALAMIN 1000 MCG/ML IJ SOLN
1000.0000 ug | Freq: Once | INTRAMUSCULAR | Status: AC
Start: 2022-10-02 — End: 2022-10-02
  Administered 2022-10-02: 1000 ug via INTRAMUSCULAR

## 2022-10-02 NOTE — Assessment & Plan Note (Signed)
B12 level on the low side Start oral B12 1000 mcg daily Will give B12 injection today

## 2022-10-02 NOTE — Assessment & Plan Note (Signed)
Chronic Having.'s of severe exhaustion She did feel better after IVF in the ED so dehydration may be a component of this Sleep apnea not ideally treated with inspire so that could be contributing as well ?  POTS disease or some form of dysautonomia

## 2022-10-02 NOTE — Assessment & Plan Note (Signed)
Chronic Poorly controlled Will be seeing the specialist at [facility], but appointment is not for several months Continue horizont  600 mg nightly Continue hydrocodone-acetaminophen 5-325 mg-1-2 tabs at bedtime, Lyrica 75 mg in the morning and afternoon and 150 mg at night Trying to taper off Requip and Mirapex

## 2022-10-02 NOTE — Assessment & Plan Note (Signed)
Chronic Continue lasix 40 mg daily

## 2022-10-02 NOTE — Assessment & Plan Note (Signed)
Recent UTI diagnosed at the ED-has had no urinary symptoms Urine culture showed more than 100,000 E. coli Treated with Keflex-completes antibiotic today Denies any symptoms or abdominal pain at this time

## 2022-10-02 NOTE — Telephone Encounter (Signed)
Transition Care Management Unsuccessful Follow-up Telephone Call  Date of discharge and from where:  [facility] 5/18  Attempts:  1st Attempt  Reason for unsuccessful TCM follow-up call:  No answer/busy    

## 2022-10-07 ENCOUNTER — Other Ambulatory Visit: Payer: Self-pay | Admitting: Internal Medicine

## 2022-10-08 ENCOUNTER — Other Ambulatory Visit: Payer: Self-pay

## 2022-10-08 ENCOUNTER — Telehealth: Payer: Self-pay | Admitting: Pharmacy Technician

## 2022-10-08 ENCOUNTER — Encounter: Payer: Self-pay | Admitting: Internal Medicine

## 2022-10-08 ENCOUNTER — Other Ambulatory Visit (HOSPITAL_COMMUNITY): Payer: Self-pay

## 2022-10-08 NOTE — Telephone Encounter (Signed)
Monoferric - iron infusion ordered  x 1 --  I think the order is correct -- please call them and confirm order is ok.  Thanks.

## 2022-10-08 NOTE — Telephone Encounter (Addendum)
Albin Felling,  Received call from patient requesting iron infusion. We will need a referral entered prior to treating the patient. Please enter the referral in the [facility] work que and I will begin the benefits investigation and prior auth process.   Auth Submission: APPROVED Site of care: Site of care: [facility] Payer: [employer] medicare Medication & CPT/J Code(s) submitted: Monoferric (Ferrci derisomaltose) [phone] Route of submission (phone, fax, portal):  Phone # Fax # Auth type: Buy/Bill Units/visits requested: x1 dose Reference number: [id number] Approval from: 10/09/22 to 05/11/23   Verified with insurance DX code G25.81 has been approved.

## 2022-10-08 NOTE — Telephone Encounter (Signed)
Kim from [facility] is checking on pricing for patient.

## 2022-10-12 ENCOUNTER — Telehealth: Payer: Self-pay

## 2022-10-13 ENCOUNTER — Encounter: Payer: Self-pay | Admitting: Internal Medicine

## 2022-10-13 NOTE — Telephone Encounter (Signed)
Ordered

## 2022-10-17 ENCOUNTER — Encounter: Payer: Self-pay | Admitting: Cardiovascular Disease

## 2022-10-20 NOTE — Telephone Encounter (Signed)
This is Dr. Herbie Baltimore, covering for Dr. Royann Shivers.  I have no idea what this message means. Iron deficiency anemia does not have anything to do with POTS.  Perhaps she can be scheduled to see Dr. Royann Shivers or APP to discuss these concerns.  Bryan Lemma, MD

## 2022-10-21 NOTE — Telephone Encounter (Signed)
I spoke with this patient, and she said that she really did not know what Dr Lawerance Bach was talking about either. I did pull up some information while on the phone with the pt.   Postural orthostatic tachycardia syndrome (POTS) is one of a group of disorders that have orthostatic intolerance (OI) as their primary symptom. OI is a condition in which an excessively reduced volume of blood returns to the heart after an individual stands up from a lying down position.  This is known as secondary restless legs syndrome. You can develop secondary restless legs syndrome if you: have iron deficiency anaemia (low levels of iron in the blood can lead to a fall in dopamine, triggering restless legs syndrome)   Low iron storage and mild anemia are associated with POTS suggesting that low iron storage is a potentially pathophysiologic factor in both POTS and NMS  Governor Rooks al. found an association between restless legs syndrome, a condition often caused by iron deficiency anemia, and POTS. Disruption of nitric oxide activity from endothelial cells and subsequent postural vasodilation has been suggested as one mechanism for anemia-induced orthostatic intolerance.  I asked the patient if she would like to be seen by an APP, she said not at this time. She just wanted Dr C to look over it and see what he thinks.

## 2022-10-23 ENCOUNTER — Ambulatory Visit (INDEPENDENT_AMBULATORY_CARE_PROVIDER_SITE_OTHER): Payer: Medicare PPO

## 2022-10-23 VITALS — BP 141/79 | HR 86 | Temp 98.6°F | Resp 16 | Ht 66.0 in | Wt 214.6 lb

## 2022-10-23 DIAGNOSIS — E611 Iron deficiency: Secondary | ICD-10-CM

## 2022-10-23 DIAGNOSIS — G2581 Restless legs syndrome: Secondary | ICD-10-CM

## 2022-10-23 MED ORDER — ACETAMINOPHEN 325 MG PO TABS
650.0000 mg | ORAL_TABLET | Freq: Once | ORAL | Status: AC
  Administered 2022-10-23: 650 mg via ORAL
  Filled 2022-10-23: qty 2

## 2022-10-23 MED ORDER — DIPHENHYDRAMINE HCL 25 MG PO CAPS
25.0000 mg | ORAL_CAPSULE | Freq: Once | ORAL | Status: AC
  Administered 2022-10-23: 25 mg via ORAL

## 2022-10-23 MED ORDER — SODIUM CHLORIDE 0.9 % IV SOLN
1000.0000 mg | Freq: Once | INTRAVENOUS | Status: AC
  Administered 2022-10-23: 1000 mg via INTRAVENOUS
  Filled 2022-10-23: qty 10

## 2022-10-23 MED ORDER — DIPHENHYDRAMINE HCL 50 MG/ML IJ SOLN
25.0000 mg | Freq: Once | INTRAMUSCULAR | Status: DC
  Filled 2022-10-23: qty 1

## 2022-10-23 NOTE — Patient Instructions (Signed)

## 2022-10-23 NOTE — Progress Notes (Signed)
Diagnosis: Iron Deficiency Anemia  Provider:  Chilton Greathouse MD  Procedure: IV Infusion  IV Type: Peripheral, IV Location: L Hand  Monoferric (Ferric Derisomaltose), Dose: 1000 mg  Infusion Start Time: 1457  Infusion Stop Time: 1520  Post Infusion IV Care: Observation period completed and Peripheral IV Discontinued  Discharge: Condition: Good, Destination: Home . AVS Provided  Performed by:  Loney Hering, LPN

## 2022-10-24 ENCOUNTER — Other Ambulatory Visit: Payer: Self-pay | Admitting: Cardiovascular Disease

## 2022-10-24 ENCOUNTER — Encounter: Payer: Self-pay | Admitting: Internal Medicine

## 2022-10-25 ENCOUNTER — Other Ambulatory Visit: Payer: Self-pay | Admitting: Internal Medicine

## 2022-10-26 DIAGNOSIS — G4733 Obstructive sleep apnea (adult) (pediatric): Secondary | ICD-10-CM | POA: Diagnosis not present

## 2022-10-26 DIAGNOSIS — G4723 Circadian rhythm sleep disorder, irregular sleep wake type: Secondary | ICD-10-CM | POA: Diagnosis not present

## 2022-10-26 DIAGNOSIS — G2581 Restless legs syndrome: Secondary | ICD-10-CM | POA: Diagnosis not present

## 2022-10-26 DIAGNOSIS — I10 Essential (primary) hypertension: Secondary | ICD-10-CM | POA: Diagnosis not present

## 2022-10-26 NOTE — Telephone Encounter (Signed)
I am also a little confused. On my review, the latest iron studies (from May) appear to be normal.

## 2022-10-26 NOTE — Telephone Encounter (Signed)
Not sure what this med is for. Is it ok to refill.Marland KitchenRaechel Chute

## 2022-10-29 ENCOUNTER — Encounter: Payer: Self-pay | Admitting: Internal Medicine

## 2022-10-29 DIAGNOSIS — M545 Low back pain, unspecified: Secondary | ICD-10-CM

## 2022-10-29 MED ORDER — HYDROCODONE-ACETAMINOPHEN 5-325 MG PO TABS
1.0000 | ORAL_TABLET | Freq: Every day | ORAL | 0 refills | Status: DC
Start: 2022-10-29 — End: 2022-11-27

## 2022-11-02 DIAGNOSIS — I739 Peripheral vascular disease, unspecified: Secondary | ICD-10-CM | POA: Diagnosis not present

## 2022-11-02 DIAGNOSIS — L84 Corns and callosities: Secondary | ICD-10-CM | POA: Diagnosis not present

## 2022-11-02 DIAGNOSIS — B351 Tinea unguium: Secondary | ICD-10-CM | POA: Diagnosis not present

## 2022-11-02 DIAGNOSIS — L603 Nail dystrophy: Secondary | ICD-10-CM | POA: Diagnosis not present

## 2022-11-15 ENCOUNTER — Other Ambulatory Visit: Payer: Self-pay | Admitting: Internal Medicine

## 2022-11-16 ENCOUNTER — Ambulatory Visit: Payer: Medicare PPO | Admitting: Internal Medicine

## 2022-11-25 DIAGNOSIS — F5104 Psychophysiologic insomnia: Secondary | ICD-10-CM | POA: Diagnosis not present

## 2022-11-25 DIAGNOSIS — Z01419 Encounter for gynecological examination (general) (routine) without abnormal findings: Secondary | ICD-10-CM | POA: Diagnosis not present

## 2022-11-25 DIAGNOSIS — Z6836 Body mass index (BMI) 36.0-36.9, adult: Secondary | ICD-10-CM | POA: Diagnosis not present

## 2022-11-27 ENCOUNTER — Telehealth: Payer: Self-pay | Admitting: Urgent Care

## 2022-11-27 DIAGNOSIS — M545 Low back pain, unspecified: Secondary | ICD-10-CM

## 2022-11-27 MED ORDER — HYDROCODONE-ACETAMINOPHEN 5-325 MG PO TABS
1.0000 | ORAL_TABLET | Freq: Every day | ORAL | 0 refills | Status: DC
Start: 2022-11-27 — End: 2022-12-25

## 2022-11-27 NOTE — Telephone Encounter (Signed)
Pt sent message requesting norco refill. Last filled 10/30/22. PDMP reviewed.

## 2022-12-03 DIAGNOSIS — I10 Essential (primary) hypertension: Secondary | ICD-10-CM | POA: Diagnosis not present

## 2022-12-03 DIAGNOSIS — G4733 Obstructive sleep apnea (adult) (pediatric): Secondary | ICD-10-CM | POA: Diagnosis not present

## 2022-12-03 DIAGNOSIS — G2581 Restless legs syndrome: Secondary | ICD-10-CM | POA: Diagnosis not present

## 2022-12-05 ENCOUNTER — Other Ambulatory Visit: Payer: Self-pay | Admitting: Internal Medicine

## 2022-12-08 ENCOUNTER — Encounter: Payer: Medicare PPO | Attending: Physical Medicine and Rehabilitation | Admitting: Physical Medicine and Rehabilitation

## 2022-12-08 ENCOUNTER — Encounter: Payer: Self-pay | Admitting: Physical Medicine and Rehabilitation

## 2022-12-08 VITALS — BP 123/82 | HR 89 | Ht 66.0 in | Wt 217.0 lb

## 2022-12-08 DIAGNOSIS — G2581 Restless legs syndrome: Secondary | ICD-10-CM | POA: Diagnosis not present

## 2022-12-08 DIAGNOSIS — G4733 Obstructive sleep apnea (adult) (pediatric): Secondary | ICD-10-CM | POA: Insufficient documentation

## 2022-12-08 MED ORDER — ROPINIROLE HCL 1 MG PO TABS: 1.0000 mg | ORAL_TABLET | Freq: Every day | ORAL | 3 refills | Status: DC

## 2022-12-08 NOTE — Progress Notes (Addendum)
Subjective:    Patient ID: Erin Roach, female    DOB: [birth year], 82 y.o.   MRN: [id number]  HPI  1) Restless leg syndrome: -first noted when she was 32 and was pregnant with her first child -she she retired at age 54 her symptoms returned and it seems to be getting worse -had an iron infusion and this did not help -she has been started on Requip, she is taking 0.6mg  at night  2) OSA: -Earnest Bailey is working well  3) Neuropathy: -HgbA1c reviewed and is 6.3  Pain Inventory Average Pain 8 Pain Right Now 4 My pain is burning and stabbing  In the last 24 hours, has pain interfered with the following? General activity 6 Relation with others 4 Enjoyment of life 9 What TIME of day is your pain at its worst? evening Sleep (in general) Fair  Pain is worse with: inactivity and standing Pain improves with: medication and injections Relief from Meds: 5  walk without assistance how many minutes can you walk? 5 ability to climb steps?  yes do you drive?  yes  retired  bladder control problems tremor trouble walking depression  no  New patient    Family History  Problem Relation Age of Onset   Esophageal cancer Father    Heart disease Father        Unknown what kind   Hyperlipidemia Father    Hypertension Father    Cancer Father    Heart failure Father    Hyperlipidemia Mother    Hypertension Mother    CAD Sister        10 years younger than patient - had stent. Was told that her anorexia/bulimia may have played a role   Stroke Maternal Grandmother 51   Diabetes Maternal Grandmother    Heart attack Maternal Grandfather 33   COPD Maternal Grandfather    Heart attack Paternal Grandmother        37s   Diabetes Other        MGGM   Social History   Socioeconomic History   Marital status: Married    Spouse name: Ted   Number of children: 2   Years of education: Not on file   Highest education level: Not on file  Occupational History   Occupation:  retired Runner, broadcasting/film/video  Tobacco Use   Smoking status: Former    Current packs/day: 0.00    Average packs/day: 0.5 packs/day for 10.0 years (5.0 ttl pk-yrs)    Types: Cigarettes    Start date: 12/11/1963    Quit date: 12/10/1973    Years since quitting: 49.0   Smokeless tobacco: Never  Vaping Use   Vaping status: Never Used  Substance and Sexual Activity   Alcohol use: Yes    Comment: occasional   Drug use: No   Sexual activity: Not on file  Other Topics Concern   Not on file  Social History Narrative   2 children ages 44,35   Social Determinants of Health   Financial Resource Strain: Low Risk  (07/08/2021)   Received from [facility]   Overall Financial Resource Strain (CARDIA)    Difficulty of Paying Living Expenses: Not hard at all  Food Insecurity: No Food Insecurity (07/08/2021)   Received from [facility]   Hunger Vital Sign    Worried About Running Out of Food in the Last Year: Never true    Ran Out of Food in the Last Year: Never true  Transportation Needs: No Transportation Needs (07/08/2021)  Received from [facility] - Transportation    Lack of Transportation (Medical): No    Lack of Transportation (Non-Medical): No  Physical Activity: Insufficiently Active (07/08/2021)   Received from [facility]   Exercise Vital Sign    Days of Exercise per Week: 3 days    Minutes of Exercise per Session: 10 min  Stress: Stress Concern Present (07/08/2021)   Received from [facility] - Occupational Stress Questionnaire    Feeling of Stress : To some extent  Social Connections: Unknown (09/08/2021)   [facility] from [facility], [facility], [facility]   Social Network    Social Network: Not on file   Past Surgical History:  Procedure Laterality Date   APPENDECTOMY  07-02-2005   dr Zachery Dakins   open   CARDIAC CATHETERIZATION  01-23-2009  dr Smitty Cords brodie   normal coronary arteries and lvf   CARDIOVASCULAR STRESS TEST  03/31/2016   Low risk  nuclear study w/ medium defect of mild severity in basal anterior and mid anterior location w/ no evidence ischemia or infarction/  normal LV function and wall motion,  nuclear stress ef 71%   CATARACT EXTRACTION W/ INTRAOCULAR LENS IMPLANT Left 09/2016   COLONOSCOPY  last one 04-24-2016   DRUG INDUCED ENDOSCOPY Bilateral 02/11/2022   Procedure: DRUG INDUCED ENDOSCOPY;  Surgeon: Christia Reading, MD;  Location: [facility];  Service: ENT;  Laterality: Bilateral;   HYSTEROSCOPY WITH D & C N/A 12/22/2016   Procedure: DILATATION AND CURETTAGE /HYSTEROSCOPY;  Surgeon: Marcelle Overlie, MD;  Location: [facility];  Service: Gynecology;  Laterality: N/A;   IMPLANTATION OF HYPOGLOSSAL NERVE STIMULATOR Right 04/21/2022   Procedure: IMPLANTATION OF HYPOGLOSSAL NERVE STIMULATOR;  Surgeon: Christia Reading, MD;  Location: [facility];  Service: ENT;  Laterality: Right;   IR US GUIDANCE  09/08/2016   POSTERIOR LUMBAR FUSION  02/ 2018    [facility])   SKIN CANCER EXCISION     TONSILLECTOMY  child   TOTAL KNEE ARTHROPLASTY Left 04/09/2015   Procedure: LEFT TOTAL KNEE ARTHROPLASTY;  Surgeon: Durene Romans, MD;  Location: [facility];  Service: Orthopedics;  Laterality: Left;   TRANSTHORACIC ECHOCARDIOGRAM  01/21/2009   mild LVH, ef 75-80%, grade 1 diastolic dysfunction   ULNAR NERVE TRANSPOSITION Left 12-20-2008  dr sypher   decompression and partial resection medial triceps fascia   WIDE LOCAL EXCISION PILONIDAL AREA  02-02-2002  dr Zachery Dakins   recurrent squamous cell carcinoma in situ   Past Medical History:  Diagnosis Date   Allergic rhinitis    Apnea    Arthritis    Asthma    very mild   Back pain    Bilateral lower extremity edema    Carotid artery disease (HCC)    a. Carotid duplex 03/2016 - duplex was stable, 40-59% RICA, 1-39% LICA   Complication of anesthesia    post-op delirium   Endometrial polyp    Essential hypertension    GERD  (gastroesophageal reflux disease)    Heart murmur    per pt   History of adenomatous polyp of colon    04-24-2016  tubular adenoma   History of squamous cell carcinoma excision    pilonidal area second excision 02-02-2002   HTN (hypertension)    Hyperlipidemia    Leg pain    OAB (overactive bladder)    RA (rheumatoid arthritis) (HCC)    RBBB (right bundle branch block with left anterior  fascicular block)    RLS (restless legs syndrome)    Shortness of breath on exertion    SUI (stress urinary incontinence, female)    Venous insufficiency    legs   Vitamin D deficiency    BP 123/82   Pulse 89   Ht 5\' 6"  (1.676 m)   Wt 217 lb (98.4 kg)   SpO2 95%   BMI 35.02 kg/m   Opioid Risk Score:   Fall Risk Score:  `1  Depression screen [facility] 2/9     06/05/2022    9:17 AM 05/18/2022    2:35 PM 01/09/2022    2:27 PM 12/05/2021   11:33 AM 11/18/2021    8:40 AM 11/05/2021    3:44 PM 04/30/2021    5:18 PM  Depression screen PHQ 2/9  Decreased Interest 0 1 0 0 0 0 0  Down, Depressed, Hopeless 0 1 0 0 0 1 0  PHQ - 2 Score 0 2 0 0 0 1 0  Altered sleeping 0 3 0 3  2 3   Tired, decreased energy 0 3 0 2  2 2   Change in appetite 0 1 0 3  2 0  Feeling bad or failure about yourself  0 0 0 0  2 0  Trouble concentrating 0 0 0 0  2 0  Moving slowly or fidgety/restless 0 0 0 0  0 0  Suicidal thoughts 0 0 0 0  2 0  PHQ-9 Score 0 9 0 8  13 5   Difficult doing work/chores Not difficult at all Somewhat difficult Not difficult at all Somewhat difficult  Somewhat difficult Not difficult at all      Review of Systems  Respiratory:  Positive for apnea.   Endocrine:       High/low bs  Musculoskeletal:        B/L leg pain  All other systems reviewed and are negative.      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   -continue Lyrica  Prescribed Zynex Nexwave and heating/cooling blanket   -Discussed Qutenza as an option for neuropathic pain control. Discussed that this is a capsaicin patch, stronger than capsaicin cream. Discussed that it is currently approved for diabetic peripheral neuropathy and post-herpetic neuralgia, but that it has also shown benefit in treating other forms of neuropathy. Provided patient with link to site to learn more about the patch: https://www.clark.biz/. Discussed that the patch would be placed in office and benefits usually last 3 months. Discussed that unintended exposure to capsaicin can cause severe irritation of eyes, mucous membranes, respiratory tract, and skin, but that Qutenza is a local treatment and does not have the systemic side effects of other nerve medications. Discussed that there may be pain, itching, erythema, and decreased sensory function associated with the application of Qutenza. Side effects usually subside within 1 week. A cold pack of analgesic medications can help with these side effects. Blood pressure can also be increased due to pain associated with administration of the patch.   -Discussed benefits of exercise in reducing pain. -Discussed following foods that may reduce pain: 1) Ginger (especially studied  for arthritis)- reduce leukotriene production to decrease inflammation 2) Blueberries- high in phytonutrients that decrease inflammation 3) Salmon- marine omega-3s reduce joint swelling and pain 4) Pumpkin seeds- reduce inflammation 5) dark chocolate- reduces inflammation 6) turmeric- reduces inflammation 7) tart cherries - reduce pain and stiffness 8) extra virgin olive oil - its compound olecanthal helps to block prostaglandins  9) chili peppers- can  be eaten or applied topically via capsaicin 10) mint- helpful for headache, muscle aches, joint pain, and itching 11) garlic- reduces inflammation  Link to further information on diet for chronic pain: http://www.bray.com/    2) Restless leg syndrome:  -requip 1mg  HS -discussed that she failed a patch before.

## 2022-12-09 ENCOUNTER — Encounter: Payer: Self-pay | Admitting: Internal Medicine

## 2022-12-10 ENCOUNTER — Ambulatory Visit (INDEPENDENT_AMBULATORY_CARE_PROVIDER_SITE_OTHER): Payer: Medicare PPO

## 2022-12-10 VITALS — Ht 66.0 in | Wt 210.0 lb

## 2022-12-10 DIAGNOSIS — Z0000 Encounter for general adult medical examination without abnormal findings: Secondary | ICD-10-CM

## 2022-12-10 NOTE — Progress Notes (Signed)
Subjective:   Erin Roach is a 82 y.o. female who presents for Medicare Annual (Subsequent) preventive examination.  Visit Complete: Virtual  I connected with  [name] on 12/10/22 by a audio enabled telemedicine application and verified that I am speaking with the correct person using two identifiers.  Patient Location: Home  Provider Location: Office/Clinic  I discussed the limitations of evaluation and management by telemedicine. The patient expressed understanding and agreed to proceed.  Vital Signs: Patient was unable to self-report vital signs via telehealth due to a lack of equipment at home.   Review of Systems     Cardiac Risk Factors include: advanced age (>9men, >57 women);hypertension;family history of premature cardiovascular disease;obesity (BMI >30kg/m2);sedentary lifestyle;dyslipidemia     Objective:    Today's Vitals   12/10/22 1535  Weight: 210 lb (95.3 kg)  Height: 5\' 6"  (1.676 m)  PainSc: 0-No pain   Body mass index is 33.89 kg/m.     12/10/2022    3:37 PM 09/26/2022    3:50 PM 04/21/2022   12:23 PM 04/14/2022    4:38 PM 02/11/2022    7:17 AM 02/05/2022    9:11 AM 03/09/2019   12:34 PM  Advanced Directives  Does Patient Have a Medical Advance Directive? Yes No Yes Yes Yes Yes Yes  Type of Estate agent of [place];Living will  Healthcare Power of [place];Living will Healthcare Power of [place];Living will Healthcare Power of [place];Living will Healthcare Power of [place];Living will Living will;Healthcare Power of Attorney  Does patient want to make changes to medical advance directive?    No - Patient declined No - Patient declined No - Patient declined No - Patient declined  Copy of Healthcare Power of Attorney in Chart? No - copy requested  No - copy requested Yes - validated most recent copy scanned in chart (See row information) No - copy requested  No - copy requested  Would patient like information on creating  a medical advance directive?  No - Patient declined         Current Medications (verified) Outpatient Encounter Medications as of 12/10/2022  Medication Sig   albuterol (PROVENTIL) (2.5 MG/3ML) 0.083% nebulizer solution Take 3 mLs (2.5 mg total) by nebulization every 6 (six) hours as needed for wheezing or shortness of breath.   albuterol (VENTOLIN HFA) 108 (90 Base) MCG/ACT inhaler Inhale 2 puffs into the lungs every 6 (six) hours as needed for wheezing or shortness of breath.   aspirin EC 81 MG tablet Take 1 tablet (81 mg total) by mouth daily. Swallow whole.   cholecalciferol (VITAMIN D3) 25 MCG (1000 UNIT) tablet Take 1,000 Units by mouth daily.   co-enzyme Q-10 30 MG capsule Take 30 mg by mouth daily.   Eszopiclone 3 MG TABS TAKE 1 TABLET BY MOUTH IMMEDIATELY BEFORE BEDTIME   ezetimibe (ZETIA) 10 MG tablet TAKE ONE TABLET BY MOUTH DAILY   furosemide (LASIX) 20 MG tablet TAKE 1 TABLET BY MOUTH DAILY   HORIZANT 600 MG TBCR TAKE 1 TABLET BY MOUTH AT BEDTIME   hydrALAZINE (APRESOLINE) 50 MG tablet TAKE ONE TABLET BY MOUTH THREE TIMES A DAY   HYDROcodone-acetaminophen (NORCO/VICODIN) 5-325 MG tablet Take 1-2 tablets by mouth at bedtime.   losartan (COZAAR) 50 MG tablet TAKE 1 TABLET BY MOUTH DAILY   magnesium gluconate (MAGONATE) 500 MG tablet Take 500 mg by mouth 2 (two) times daily.   pregabalin (LYRICA) 75 MG capsule TAKE ONE CAPSULE BY MOUTH EVERY AFTERNOON AND  TAKE TWO CAPSULES BY MOUTH EVERY NIGHT AT BEDTIME   rOPINIRole (REQUIP) 1 MG tablet Take 1 tablet (1 mg total) by mouth at bedtime.   Semaglutide, 2 MG/DOSE, 8 MG/3ML SOPN Inject 2 mg as directed once a week.   simvastatin (ZOCOR) 40 MG tablet TAKE ONE TABLET BY MOUTH EVERY NIGHT AT BEDTIME   spironolactone (ALDACTONE) 25 MG tablet TAKE ONE TABLET BY MOUTH DAILY   No facility-administered encounter medications on file as of 12/10/2022.    Allergies (verified) Patient has no known allergies.   History: Past Medical History:   Diagnosis Date   Allergic rhinitis    Apnea    Arthritis    Asthma    very mild   Back pain    Bilateral lower extremity edema    Carotid artery disease (HCC)    a. Carotid duplex 03/2016 - duplex was stable, 40-59% RICA, 1-39% LICA   Complication of anesthesia    post-op delirium   Endometrial polyp    Essential hypertension    GERD (gastroesophageal reflux disease)    Heart murmur    per pt   History of adenomatous polyp of colon    04-24-2016  tubular adenoma   History of squamous cell carcinoma excision    pilonidal area second excision 02-02-2002   HTN (hypertension)    Hyperlipidemia    Leg pain    OAB (overactive bladder)    RA (rheumatoid arthritis) (HCC)    RBBB (right bundle branch block with left anterior fascicular block)    RLS (restless legs syndrome)    Shortness of breath on exertion    SUI (stress urinary incontinence, female)    Venous insufficiency    legs   Vitamin D deficiency    Past Surgical History:  Procedure Laterality Date   APPENDECTOMY  07-02-2005   dr Zachery Dakins   open   CARDIAC CATHETERIZATION  01-23-2009  dr Smitty Cords brodie   normal coronary arteries and lvf   CARDIOVASCULAR STRESS TEST  03/31/2016   Low risk nuclear study w/ medium defect of mild severity in basal anterior and mid anterior location w/ no evidence ischemia or infarction/  normal LV function and wall motion,  nuclear stress ef 71%   CATARACT EXTRACTION W/ INTRAOCULAR LENS IMPLANT Left 09/2016   COLONOSCOPY  last one 04-24-2016   DRUG INDUCED ENDOSCOPY Bilateral 02/11/2022   Procedure: DRUG INDUCED ENDOSCOPY;  Surgeon: Christia Reading, MD;  Location: [facility];  Service: ENT;  Laterality: Bilateral;   HYSTEROSCOPY WITH D & C N/A 12/22/2016   Procedure: DILATATION AND CURETTAGE /HYSTEROSCOPY;  Surgeon: Marcelle Overlie, MD;  Location: [facility];  Service: Gynecology;  Laterality: N/A;   IMPLANTATION OF HYPOGLOSSAL NERVE STIMULATOR Right 04/21/2022    Procedure: IMPLANTATION OF HYPOGLOSSAL NERVE STIMULATOR;  Surgeon: Christia Reading, MD;  Location: [facility];  Service: ENT;  Laterality: Right;   IR US GUIDANCE  09/08/2016   POSTERIOR LUMBAR FUSION  02/ 2018    [facility] ([place], [place])   SKIN CANCER EXCISION     TONSILLECTOMY  child   TOTAL KNEE ARTHROPLASTY Left 04/09/2015   Procedure: LEFT TOTAL KNEE ARTHROPLASTY;  Surgeon: Durene Romans, MD;  Location: [facility];  Service: Orthopedics;  Laterality: Left;   TRANSTHORACIC ECHOCARDIOGRAM  01/21/2009   mild LVH, ef 75-80%, grade 1 diastolic dysfunction   ULNAR NERVE TRANSPOSITION Left 12-20-2008  dr sypher   decompression and partial resection medial triceps fascia   WIDE LOCAL EXCISION  PILONIDAL AREA  02-02-2002  dr Zachery Dakins   recurrent squamous cell carcinoma in situ   Family History  Problem Relation Age of Onset   Esophageal cancer Father    Heart disease Father        Unknown what kind   Hyperlipidemia Father    Hypertension Father    Cancer Father    Heart failure Father    Hyperlipidemia Mother    Hypertension Mother    CAD Sister        10 years younger than patient - had stent. Was told that her anorexia/bulimia may have played a role   Stroke Maternal Grandmother 32   Diabetes Maternal Grandmother    Heart attack Maternal Grandfather 41   COPD Maternal Grandfather    Heart attack Paternal Grandmother        67s   Diabetes Other        MGGM   Social History   Socioeconomic History   Marital status: Married    Spouse name: Ted   Number of children: 2   Years of education: Not on file   Highest education level: Not on file  Occupational History   Occupation: retired Runner, broadcasting/film/video  Tobacco Use   Smoking status: Former    Current packs/day: 0.00    Average packs/day: 0.5 packs/day for 10.0 years (5.0 ttl pk-yrs)    Types: Cigarettes    Start date: 12/11/1963    Quit date: 12/10/1973    Years since quitting: 49.0   Smokeless tobacco: Never  Vaping  Use   Vaping status: Never Used  Substance and Sexual Activity   Alcohol use: Yes    Comment: occasional   Drug use: No   Sexual activity: Not on file  Other Topics Concern   Not on file  Social History Narrative   2 children ages 15,35   Social Determinants of Health   Financial Resource Strain: Low Risk  (12/10/2022)   Overall Financial Resource Strain (CARDIA)    Difficulty of Paying Living Expenses: Not hard at all  Food Insecurity: No Food Insecurity (12/10/2022)   Hunger Vital Sign    Worried About Running Out of Food in the Last Year: Never true    Ran Out of Food in the Last Year: Never true  Transportation Needs: No Transportation Needs (12/10/2022)   PRAPARE - Administrator, Civil Service (Medical): No    Lack of Transportation (Non-Medical): No  Physical Activity: Insufficiently Active (12/10/2022)   Exercise Vital Sign    Days of Exercise per Week: 3 days    Minutes of Exercise per Session: 40 min  Stress: No Stress Concern Present (12/10/2022)   [employer] of Occupational Health - Occupational Stress Questionnaire    Feeling of Stress : Not at all  Social Connections: Unknown (12/10/2022)   Social Connection and Isolation Panel [NHANES]    Frequency of Communication with Friends and Family: More than three times a week    Frequency of Social Gatherings with Friends and Family: More than three times a week    Attends Religious Services: Not on Marketing executive or Organizations: Not on file    Attends Banker Meetings: Not on file    Marital Status: Married    Tobacco Counseling Counseling given: Not Answered   Clinical Intake:  Pre-visit preparation completed: Yes  Pain : No/denies pain Pain Score: 0-No pain     BMI - recorded: 33.89 Nutritional Status:  BMI > 30  Obese Nutritional Risks: None Diabetes: No  How often do you need to have someone help you when you read instructions, pamphlets, or other written  materials from your doctor or pharmacy?: 1 - Never What is the last grade level you completed in school?: College Graduate; Retired Garment/textile technologist Needed?: No  Information entered by :: [employer]. Stehanie Ekstrom, LPN.   Activities of Daily Living    12/10/2022    3:39 PM 04/21/2022   12:28 PM  In your present state of health, do you have any difficulty performing the following activities:  Hearing? 1 0  Vision? 0 0  Difficulty concentrating or making decisions? 0 0  Walking or climbing stairs? 0 0  Dressing or bathing? 0 0  Doing errands, shopping? 0   Preparing Food and eating ? N   Using the Toilet? N   In the past six months, have you accidently leaked urine? Y   Do you have problems with loss of bowel control? N   Managing your Medications? N   Managing your Finances? N   Housekeeping or managing your Housekeeping? N     Patient Care Team: Pincus Sanes, MD as PCP - General (Internal Medicine) Thurmon Fair, MD as PCP - Cardiology (Cardiology) Sallye Lat, MD as Consulting Physician (Ophthalmology) Althea Charon, MD as Consulting Physician (Radiology)  Indicate any recent Medical Services you may have received from other than [facility] providers in the past year (date may be approximate).     Assessment:   This is a routine wellness examination for [place].  Hearing/Vision screen Hearing Screening - Comments:: Patient has hearing difficulty and wears hearing aids. Vision Screening - Comments:: Patient does wear otc readers.  Eye exam done by: [facility]    Dietary issues and exercise activities discussed:     Goals Addressed             This Visit's Progress    My healthcare goal for 2024 is to be more physically active.        Depression Screen    12/10/2022    3:45 PM 12/08/2022    2:01 PM 06/05/2022    9:17 AM 05/18/2022    2:35 PM 01/09/2022    2:27 PM 12/05/2021   11:33 AM 11/18/2021    8:40 AM  PHQ 2/9 Scores  PHQ - 2 Score 4 4 0  2 0 0 0  PHQ- 9 Score 14 14 0 9 0 8     Fall Risk    12/10/2022    3:38 PM 06/05/2022    9:16 AM 05/18/2022    2:34 PM 02/10/2022    1:49 PM 02/10/2022    1:13 PM  Fall Risk   Falls in the past year? 0 0 0 0 0  Number falls in past yr: 0 0 0 0 0  Injury with Fall? 0 0 0 0 0  Risk for fall due to : No Fall Risks No Fall Risks No Fall Risks No Fall Risks No Fall Risks  Follow up Falls prevention discussed Falls evaluation completed Falls evaluation completed Falls evaluation completed Falls evaluation completed    MEDICARE RISK AT HOME:  Medicare Risk at Home - 12/10/22 1537     Any stairs in or around the home? Yes    If so, are there any without handrails? No    Home free of loose throw rugs in walkways, pet beds, electrical cords, etc? Yes  Adequate lighting in your home to reduce risk of falls? Yes    Life alert? No    Use of a cane, walker or w/c? No    Grab bars in the bathroom? No    Shower chair or bench in shower? Yes   bench   Elevated toilet seat or a handicapped toilet? Yes             TIMED UP AND GO:  Was the test performed?  No    Cognitive Function:        12/10/2022    3:50 PM  6CIT Screen  What Year? 0 points  What month? 0 points  What time? 0 points  Count back from 20 0 points  Months in reverse 0 points  Repeat phrase 0 points  Total Score 0 points    Immunizations Immunization History  Administered Date(s) Administered   Fluad Quad(high Dose 65+) 02/10/2022   Hepatitis A 03/17/2018   Hepatitis A, Adult 05/23/2018   Influenza Whole 03/13/2010, 02/11/2012   Influenza, High Dose Seasonal PF 03/21/2015, 03/02/2017, 02/12/2019, 04/01/2020   Influenza-Unspecified 02/08/2014, 03/19/2016   Moderna Sars-Covid-2 Vaccination 06/09/2019, 07/07/2019, 07/17/2019, 08/14/2019, 03/20/2020, 06/17/2020   Pneumococcal Conjugate-13 05/11/2001   Pneumococcal Polysaccharide-23 04/12/2012, 12/25/2017   Tdap 06/03/2016   Zoster Recombinant(Shingrix)  06/19/2017, 08/24/2017   Zoster, Live 05/11/2005    TDAP status: Up to date  Flu Vaccine status: Up to date  Pneumococcal vaccine status: Up to date  Covid-19 vaccine status: Completed vaccines  Qualifies for Shingles Vaccine? Yes   Zostavax completed Yes   Shingrix Completed?: Yes  Screening Tests Health Maintenance  Topic Date Due   FOOT EXAM  06/15/2018   COVID-19 Vaccine (7 - 2023-24 season) 01/09/2022   Diabetic kidney evaluation - Urine ACR  12/06/2022   HEMOGLOBIN A1C  11/16/2022   INFLUENZA VACCINE  12/10/2022   OPHTHALMOLOGY EXAM  12/26/2022   DEXA SCAN  01/25/2023   Diabetic kidney evaluation - eGFR measurement  09/26/2023   Medicare Annual Wellness (AWV)  12/10/2023   DTaP/Tdap/Td (2 - Td or Tdap) 06/03/2026   Pneumonia Vaccine 23+ Years old  Completed   Hepatitis C Screening  Completed   Zoster Vaccines- Shingrix  Completed   HPV VACCINES  Aged Out   Colonoscopy  Discontinued    Health Maintenance  Health Maintenance Due  Topic Date Due   FOOT EXAM  06/15/2018   COVID-19 Vaccine (7 - 2023-24 season) 01/09/2022   Diabetic kidney evaluation - Urine ACR  12/06/2022   HEMOGLOBIN A1C  11/16/2022   INFLUENZA VACCINE  12/10/2022    Colorectal cancer screening: No longer required.   Mammogram status: Completed 02/06/2021. Repeat every year (Patient goes every 2 years)  Bone Density status: Completed 01/25/2020. Results reflect: Bone density results: OSTEOPENIA. Repeat every 3 years.  Lung Cancer Screening: (Low Dose CT Chest recommended if Age 105-80 years, 20 pack-year currently smoking OR have quit w/in 15years.) does not qualify.   Lung Cancer Screening Referral: no  Additional Screening:  Hepatitis C Screening: does qualify; Completed 11/06/2009  Vision Screening: Recommended annual ophthalmology exams for early detection of glaucoma and other disorders of the eye. Is the patient up to date with their annual eye exam?  Yes  Who is the provider or  what is the name of the office in which the patient attends annual eye exams? Sallye Lat, MD. If pt is not established with a provider, would they like to be referred to a provider to  establish care? No .   Dental Screening: Recommended annual dental exams for proper oral hygiene  Diabetic Foot Exam: N/A  Community Resource Referral / Chronic Care Management: CRR required this visit?  No   CCM required this visit?  No     Plan:     I have personally reviewed and noted the following in the patient's chart:   Medical and social history Use of alcohol, tobacco or illicit drugs  Current medications and supplements including opioid prescriptions. Patient is currently taking opioid prescriptions. Information provided to patient regarding non-opioid alternatives. Patient advised to discuss non-opioid treatment plan with their provider. Functional ability and status Nutritional status Physical activity Advanced directives List of other physicians Hospitalizations, surgeries, and ER visits in previous 12 months Vitals Screenings to include cognitive, depression, and falls Referrals and appointments  In addition, I have reviewed and discussed with patient certain preventive protocols, quality metrics, and best practice recommendations. A written personalized care plan for preventive services as well as general preventive health recommendations were provided to patient.     Mickeal Needy, LPN   08/12/100   After Visit Summary: (Mail) Due to this being a telephonic visit, the after visit summary with patients personalized plan was offered to patient via mail   Nurse Notes: Normal cognitive status assessed by direct observation via telephone conversation by this Nurse Health Advisor. No abnormalities found.

## 2022-12-10 NOTE — Patient Instructions (Signed)
Erin Roach , Thank you for taking time to come for your Medicare Wellness Visit. I appreciate your ongoing commitment to your health goals. Please review the following plan we discussed and let me know if I can assist you in the future.   Referrals/Orders/Follow-Ups/Clinician Recommendations: No  This is a list of the screening recommended for you and due dates:  Health Maintenance  Topic Date Due   Complete foot exam   06/15/2018   COVID-19 Vaccine (7 - 2023-24 season) 01/09/2022   Yearly kidney health urinalysis for diabetes  12/06/2022   Hemoglobin A1C  11/16/2022   Flu Shot  12/10/2022   Eye exam for diabetics  12/26/2022   DEXA scan (bone density measurement)  01/25/2023   Yearly kidney function blood test for diabetes  09/26/2023   Medicare Annual Wellness Visit  12/10/2023   DTaP/Tdap/Td vaccine (2 - Td or Tdap) 06/03/2026   Pneumonia Vaccine  Completed   Hepatitis C Screening  Completed   Zoster (Shingles) Vaccine  Completed   HPV Vaccine  Aged Out   Colon Cancer Screening  Discontinued    Advanced directives: (Copy Requested) Please bring a copy of your health care power of attorney and living will to the office to be added to your chart at your convenience.  Next Medicare Annual Wellness Visit scheduled for next year: Yes; 12/13/2023 at 4:00 p.m. via telephone with Nurse Percell Miller.  Preventive Care 4 Years and Older, Female Preventive care refers to lifestyle choices and visits with your health care provider that can promote health and wellness. What does preventive care include? A yearly physical exam. This is also called an annual well check. Dental exams once or twice a year. Routine eye exams. Ask your health care provider how often you should have your eyes checked. Personal lifestyle choices, including: Daily care of your teeth and gums. Regular physical activity. Eating a healthy diet. Avoiding tobacco and drug use. Limiting alcohol use. Practicing safe  sex. Taking low-dose aspirin every day. Taking vitamin and mineral supplements as recommended by your health care provider. What happens during an annual well check? The services and screenings done by your health care provider during your annual well check will depend on your age, overall health, lifestyle risk factors, and family history of disease. Counseling  Your health care provider may ask you questions about your: Alcohol use. Tobacco use. Drug use. Emotional well-being. Home and relationship well-being. Sexual activity. Eating habits. History of falls. Memory and ability to understand (cognition). Work and work Astronomer. Reproductive health. Screening  You may have the following tests or measurements: Height, weight, and BMI. Blood pressure. Lipid and cholesterol levels. These may be checked every 5 years, or more frequently if you are over 33 years old. Skin check. Lung cancer screening. You may have this screening every year starting at age 38 if you have a 30-pack-year history of smoking and currently smoke or have quit within the past 15 years. Fecal occult blood test (FOBT) of the stool. You may have this test every year starting at age 18. Flexible sigmoidoscopy or colonoscopy. You may have a sigmoidoscopy every 5 years or a colonoscopy every 10 years starting at age 62. Hepatitis C blood test. Hepatitis B blood test. Sexually transmitted disease (STD) testing. Diabetes screening. This is done by checking your blood sugar (glucose) after you have not eaten for a while (fasting). You may have this done every 1-3 years. Bone density scan. This is done to screen for osteoporosis. You  may have this done starting at age 46. Mammogram. This may be done every 1-2 years. Talk to your health care provider about how often you should have regular mammograms. Talk with your health care provider about your test results, treatment options, and if necessary, the need for more  tests. Vaccines  Your health care provider may recommend certain vaccines, such as: Influenza vaccine. This is recommended every year. Tetanus, diphtheria, and acellular pertussis (Tdap, Td) vaccine. You may need a Td booster every 10 years. Zoster vaccine. You may need this after age 76. Pneumococcal 13-valent conjugate (PCV13) vaccine. One dose is recommended after age 77. Pneumococcal polysaccharide (PPSV23) vaccine. One dose is recommended after age 5. Talk to your health care provider about which screenings and vaccines you need and how often you need them. This information is not intended to replace advice given to you by your health care provider. Make sure you discuss any questions you have with your health care provider. Document Released: 05/24/2015 Document Revised: 01/15/2016 Document Reviewed: 02/26/2015 Elsevier Interactive Patient Education  2017 [employer].  Fall Prevention in the Home Falls can cause injuries. They can happen to people of all ages. There are many things you can do to make your home safe and to help prevent falls. What can I do on the outside of my home? Regularly fix the edges of walkways and driveways and fix any cracks. Remove anything that might make you trip as you walk through a door, such as a raised step or threshold. Trim any bushes or trees on the path to your home. Use bright outdoor lighting. Clear any walking paths of anything that might make someone trip, such as rocks or tools. Regularly check to see if handrails are loose or broken. Make sure that both sides of any steps have handrails. Any raised decks and porches should have guardrails on the edges. Have any leaves, snow, or ice cleared regularly. Use sand or salt on walking paths during winter. Clean up any spills in your garage right away. This includes oil or grease spills. What can I do in the bathroom? Use night lights. Install grab bars by the toilet and in the tub and shower.  Do not use towel bars as grab bars. Use non-skid mats or decals in the tub or shower. If you need to sit down in the shower, use a plastic, non-slip stool. Keep the floor dry. Clean up any water that spills on the floor as soon as it happens. Remove soap buildup in the tub or shower regularly. Attach bath mats securely with double-sided non-slip rug tape. Do not have throw rugs and other things on the floor that can make you trip. What can I do in the bedroom? Use night lights. Make sure that you have a light by your bed that is easy to reach. Do not use any sheets or blankets that are too big for your bed. They should not hang down onto the floor. Have a firm chair that has side arms. You can use this for support while you get dressed. Do not have throw rugs and other things on the floor that can make you trip. What can I do in the kitchen? Clean up any spills right away. Avoid walking on wet floors. Keep items that you use a lot in easy-to-reach places. If you need to reach something above you, use a strong step stool that has a grab bar. Keep electrical cords out of the way. Do not use floor  polish or wax that makes floors slippery. If you must use wax, use non-skid floor wax. Do not have throw rugs and other things on the floor that can make you trip. What can I do with my stairs? Do not leave any items on the stairs. Make sure that there are handrails on both sides of the stairs and use them. Fix handrails that are broken or loose. Make sure that handrails are as long as the stairways. Check any carpeting to make sure that it is firmly attached to the stairs. Fix any carpet that is loose or worn. Avoid having throw rugs at the top or bottom of the stairs. If you do have throw rugs, attach them to the floor with carpet tape. Make sure that you have a light switch at the top of the stairs and the bottom of the stairs. If you do not have them, ask someone to add them for you. What else  can I do to help prevent falls? Wear shoes that: Do not have high heels. Have rubber bottoms. Are comfortable and fit you well. Are closed at the toe. Do not wear sandals. If you use a stepladder: Make sure that it is fully opened. Do not climb a closed stepladder. Make sure that both sides of the stepladder are locked into place. Ask someone to hold it for you, if possible. Clearly mark and make sure that you can see: Any grab bars or handrails. First and last steps. Where the edge of each step is. Use tools that help you move around (mobility aids) if they are needed. These include: Canes. Walkers. Scooters. Crutches. Turn on the lights when you go into a dark area. Replace any light bulbs as soon as they burn out. Set up your furniture so you have a clear path. Avoid moving your furniture around. If any of your floors are uneven, fix them. If there are any pets around you, be aware of where they are. Review your medicines with your doctor. Some medicines can make you feel dizzy. This can increase your chance of falling. Ask your doctor what other things that you can do to help prevent falls. This information is not intended to replace advice given to you by your health care provider. Make sure you discuss any questions you have with your health care provider. Document Released: 02/21/2009 Document Revised: 10/03/2015 Document Reviewed: 06/01/2014 Elsevier Interactive Patient Education  2017 [employer].

## 2022-12-11 ENCOUNTER — Ambulatory Visit: Payer: Medicare PPO | Admitting: Internal Medicine

## 2022-12-13 ENCOUNTER — Other Ambulatory Visit: Payer: Self-pay | Admitting: Internal Medicine

## 2022-12-24 ENCOUNTER — Encounter: Payer: Self-pay | Admitting: Family Medicine

## 2022-12-24 ENCOUNTER — Ambulatory Visit (INDEPENDENT_AMBULATORY_CARE_PROVIDER_SITE_OTHER): Payer: Medicare PPO | Admitting: Family Medicine

## 2022-12-24 VITALS — BP 132/78 | HR 99 | Temp 98.8°F | Ht 66.0 in | Wt 212.0 lb

## 2022-12-24 DIAGNOSIS — R0982 Postnasal drip: Secondary | ICD-10-CM

## 2022-12-24 DIAGNOSIS — R052 Subacute cough: Secondary | ICD-10-CM

## 2022-12-24 DIAGNOSIS — G2581 Restless legs syndrome: Secondary | ICD-10-CM | POA: Diagnosis not present

## 2022-12-24 DIAGNOSIS — J3089 Other allergic rhinitis: Secondary | ICD-10-CM

## 2022-12-24 DIAGNOSIS — G894 Chronic pain syndrome: Secondary | ICD-10-CM | POA: Diagnosis not present

## 2022-12-24 DIAGNOSIS — E1142 Type 2 diabetes mellitus with diabetic polyneuropathy: Secondary | ICD-10-CM | POA: Diagnosis not present

## 2022-12-24 LAB — POC COVID19 BINAXNOW: SARS Coronavirus 2 Ag: NEGATIVE

## 2022-12-24 MED ORDER — BENZONATATE 100 MG PO CAPS: 100.0000 mg | ORAL_CAPSULE | Freq: Two times a day (BID) | ORAL | 0 refills | Status: DC | PRN

## 2022-12-24 MED ORDER — FLUTICASONE PROPIONATE 50 MCG/ACT NA SUSP
1.0000 | Freq: Every day | NASAL | 0 refills | Status: DC
Start: 2022-12-24 — End: 2023-04-13

## 2022-12-24 NOTE — Progress Notes (Signed)
Established Patient Office Visit   Subjective  Patient ID: Erin Roach, female    DOB: March 12, 1941  Age: 82 y.o. MRN: [id number]  Chief Complaint  Patient presents with   Cough    Started 2 weeks ago     Patient is a 82 year old female followed by Dr. Lawerance Bach and seen for ongoing concern.  Patient endorses cough x 2 weeks with rhinorrhea and congestion.  Denies headache, fever, sore throat, facial pain/pressure, ear pain/pressure, nausea, vomiting, loose stools.  Patient tried Mucinex.  Also tried nasal spray that she is almost out of, does not recall the name.  Patient notes history of seasonal allergies.  In the past received allergy shots.  Cough    Past Medical History:  Diagnosis Date   Allergic rhinitis    Apnea    Arthritis    Asthma    very mild   Back pain    Bilateral lower extremity edema    Carotid artery disease (HCC)    a. Carotid duplex 03/2016 - duplex was stable, 40-59% RICA, 1-39% LICA   Complication of anesthesia    post-op delirium   Endometrial polyp    Essential hypertension    GERD (gastroesophageal reflux disease)    Heart murmur    per pt   History of adenomatous polyp of colon    04-24-2016  tubular adenoma   History of squamous cell carcinoma excision    pilonidal area second excision 02-02-2002   HTN (hypertension)    Hyperlipidemia    Leg pain    OAB (overactive bladder)    RA (rheumatoid arthritis) (HCC)    RBBB (right bundle branch block with left anterior fascicular block)    RLS (restless legs syndrome)    Shortness of breath on exertion    SUI (stress urinary incontinence, female)    Venous insufficiency    legs   Vitamin D deficiency    Past Surgical History:  Procedure Laterality Date   APPENDECTOMY  07-02-2005   dr Zachery Dakins   open   CARDIAC CATHETERIZATION  01-23-2009  dr Smitty Cords brodie   normal coronary arteries and lvf   CARDIOVASCULAR STRESS TEST  03/31/2016   Low risk nuclear study w/ medium defect of mild severity  in basal anterior and mid anterior location w/ no evidence ischemia or infarction/  normal LV function and wall motion,  nuclear stress ef 71%   CATARACT EXTRACTION W/ INTRAOCULAR LENS IMPLANT Left 09/2016   COLONOSCOPY  last one 04-24-2016   DRUG INDUCED ENDOSCOPY Bilateral 02/11/2022   Procedure: DRUG INDUCED ENDOSCOPY;  Surgeon: Christia Reading, MD;  Location: [facility];  Service: ENT;  Laterality: Bilateral;   HYSTEROSCOPY WITH D & C N/A 12/22/2016   Procedure: DILATATION AND CURETTAGE /HYSTEROSCOPY;  Surgeon: Marcelle Overlie, MD;  Location: [facility] ;  Service: Gynecology;  Laterality: N/A;   IMPLANTATION OF HYPOGLOSSAL NERVE STIMULATOR Right 04/21/2022   Procedure: IMPLANTATION OF HYPOGLOSSAL NERVE STIMULATOR;  Surgeon: Christia Reading, MD;  Location: [facility];  Service: ENT;  Laterality: Right;   IR US GUIDANCE  09/08/2016   POSTERIOR LUMBAR FUSION  02/ 2018    [facility] ([place])   SKIN CANCER EXCISION     TONSILLECTOMY  child   TOTAL KNEE ARTHROPLASTY Left 04/09/2015   Procedure: LEFT TOTAL KNEE ARTHROPLASTY;  Surgeon: Durene Romans, MD;  Location: [facility];  Service: Orthopedics;  Laterality: Left;   TRANSTHORACIC ECHOCARDIOGRAM  01/21/2009   mild LVH, ef  75-80%, grade 1 diastolic dysfunction   ULNAR NERVE TRANSPOSITION Left 12-20-2008  dr sypher   decompression and partial resection medial triceps fascia   WIDE LOCAL EXCISION PILONIDAL AREA  02-02-2002  dr Zachery Dakins   recurrent squamous cell carcinoma in situ   Social History   Tobacco Use   Smoking status: Former    Current packs/day: 0.00    Average packs/day: 0.5 packs/day for 10.0 years (5.0 ttl pk-yrs)    Types: Cigarettes    Start date: 12/11/1963    Quit date: 12/10/1973    Years since quitting: 49.0   Smokeless tobacco: Never  Vaping Use   Vaping status: Never Used  Substance Use Topics   Alcohol use: Yes    Comment: occasional   Drug use: No   Family History   Problem Relation Age of Onset   Esophageal cancer Father    Heart disease Father        Unknown what kind   Hyperlipidemia Father    Hypertension Father    Cancer Father    Heart failure Father    Hyperlipidemia Mother    Hypertension Mother    CAD Sister        10 years younger than patient - had stent. Was told that her anorexia/bulimia may have played a role   Stroke Maternal Grandmother 90   Diabetes Maternal Grandmother    Heart attack Maternal Grandfather 52   COPD Maternal Grandfather    Heart attack Paternal Grandmother        48s   Diabetes Other        MGGM   No Known Allergies    Review of Systems  Respiratory:  Positive for cough.    Negative unless stated above    Objective:     BP 132/78 (BP Location: Left Arm, Patient Position: Sitting, Cuff Size: Large)   Pulse 99   Temp 98.8 F (37.1 C) (Oral)   Ht 5\' 6"  (1.676 m)   Wt 212 lb (96.2 kg)   SpO2 95%   BMI 34.22 kg/m    Physical Exam Constitutional:      General: She is not in acute distress.    Appearance: Normal appearance.  HENT:     Head: Normocephalic and atraumatic.     Right Ear: Tympanic membrane normal.     Left Ear: Tympanic membrane normal.     Nose: Mucosal edema and rhinorrhea present.     Right Turbinates: Enlarged.     Left Turbinates: Enlarged.     Right Sinus: No maxillary sinus tenderness or frontal sinus tenderness.     Left Sinus: No maxillary sinus tenderness or frontal sinus tenderness.     Mouth/Throat:     Mouth: Mucous membranes are moist.     Pharynx: Postnasal drip present.  Cardiovascular:     Rate and Rhythm: Normal rate and regular rhythm.     Heart sounds: Normal heart sounds. No murmur heard.    No gallop.  Pulmonary:     Effort: Pulmonary effort is normal. No respiratory distress.     Breath sounds: Normal breath sounds. No wheezing, rhonchi or rales.     Comments: Rare productive cough. Skin:    General: Skin is warm and dry.  Neurological:      Mental Status: She is alert and oriented to person, place, and time.      Results for orders placed or performed in visit on 12/24/22  POC COVID-19 BinaxNow  Result Value Ref  Range   SARS Coronavirus 2 Ag Negative Negative      Assessment & Plan:  Allergic rhinitis due to other allergic trigger, unspecified seasonality -     Fluticasone Propionate; Place 1 spray into both nostrils daily.  Dispense: 16 g; Refill: 0  Subacute cough -     POC COVID-19 BinaxNow -     Benzonatate; Take 1 capsule (100 mg total) by mouth 2 (two) times daily as needed for cough.  Dispense: 20 capsule; Refill: 0  Post-nasal drainage  New problem.  Cough x 2 weeks.  POC COVID testing negative.  Reassuring as lungs clear on exam.  Offered CXR given duration of symptoms.  Patient declines at this time.  Tessalon for cough.  Flonase.  Okay to use p.o. antihistamine such as Allegra or Claritin.  Continue other supportive care.  Follow-up with PCP for continued or worsening symptoms.  Return if symptoms worsen or fail to improve.   Deeann Saint, MD

## 2022-12-25 ENCOUNTER — Other Ambulatory Visit: Payer: Self-pay

## 2022-12-25 ENCOUNTER — Encounter: Payer: Self-pay | Admitting: Internal Medicine

## 2022-12-25 DIAGNOSIS — M545 Low back pain, unspecified: Secondary | ICD-10-CM

## 2022-12-25 MED ORDER — HYDROCODONE-ACETAMINOPHEN 5-325 MG PO TABS: 1.0000 | ORAL_TABLET | Freq: Every day | ORAL | 0 refills | Status: DC

## 2022-12-29 MED ORDER — HYDROCODONE-ACETAMINOPHEN 5-325 MG PO TABS: 1.0000 | ORAL_TABLET | Freq: Every day | ORAL | 0 refills | Status: DC

## 2023-01-04 DIAGNOSIS — E119 Type 2 diabetes mellitus without complications: Secondary | ICD-10-CM | POA: Diagnosis not present

## 2023-01-04 DIAGNOSIS — Z961 Presence of intraocular lens: Secondary | ICD-10-CM | POA: Diagnosis not present

## 2023-01-04 DIAGNOSIS — H2511 Age-related nuclear cataract, right eye: Secondary | ICD-10-CM | POA: Diagnosis not present

## 2023-01-04 DIAGNOSIS — H43813 Vitreous degeneration, bilateral: Secondary | ICD-10-CM | POA: Diagnosis not present

## 2023-01-04 LAB — HM DIABETES EYE EXAM

## 2023-01-06 ENCOUNTER — Ambulatory Visit (HOSPITAL_COMMUNITY)
Admission: RE | Admit: 2023-01-06 | Discharge: 2023-01-06 | Disposition: A | Discharge status: Home / Self Care | Payer: Medicare PPO | Source: Ambulatory Visit | Attending: Cardiology | Admitting: Cardiology

## 2023-01-06 ENCOUNTER — Encounter: Payer: Self-pay | Admitting: Internal Medicine

## 2023-01-06 DIAGNOSIS — I6523 Occlusion and stenosis of bilateral carotid arteries: Secondary | ICD-10-CM

## 2023-01-06 DIAGNOSIS — I779 Disorder of arteries and arterioles, unspecified: Secondary | ICD-10-CM

## 2023-01-08 ENCOUNTER — Encounter: Payer: Self-pay | Admitting: Physical Medicine and Rehabilitation

## 2023-01-08 ENCOUNTER — Encounter: Payer: Medicare PPO | Attending: Physical Medicine and Rehabilitation | Admitting: Physical Medicine and Rehabilitation

## 2023-01-08 VITALS — BP 113/75 | HR 87 | Ht 66.0 in | Wt 216.0 lb

## 2023-01-08 DIAGNOSIS — G629 Polyneuropathy, unspecified: Secondary | ICD-10-CM

## 2023-01-08 DIAGNOSIS — Z79891 Long term (current) use of opiate analgesic: Secondary | ICD-10-CM | POA: Diagnosis not present

## 2023-01-08 DIAGNOSIS — Z5181 Encounter for therapeutic drug level monitoring: Secondary | ICD-10-CM | POA: Diagnosis not present

## 2023-01-08 DIAGNOSIS — G2581 Restless legs syndrome: Secondary | ICD-10-CM | POA: Diagnosis not present

## 2023-01-08 DIAGNOSIS — G894 Chronic pain syndrome: Secondary | ICD-10-CM | POA: Diagnosis not present

## 2023-01-08 MED ORDER — CAPSAICIN-CLEANSING GEL 8 % EX KIT
4.0000 | PACK | Freq: Once | CUTANEOUS | Status: AC
Start: 2023-01-08 — End: 2023-01-08
  Administered 2023-01-08: 4 via TOPICAL

## 2023-01-08 MED ORDER — PREGABALIN 75 MG PO CAPS: ORAL_CAPSULE | ORAL | 3 refills | Status: DC

## 2023-01-08 NOTE — Progress Notes (Signed)
Subjective:    Patient ID: Erin Roach, female    DOB: 01/29/1941, 82 y.o.   MRN: [id number]  HPI Erin Roach  1) Restless leg syndrome: -first noted when she was 32 and was pregnant with her first child -she she retired at age 29 her symptoms returned and it seems to be getting worse -had an iron infusion and this did not help -she has been started on Requip, she is taking 0.6mg  at night -was hurting for 22 hours two days a ago.  -she feels she needs something for pain -she is not sure that the Requip was helping  2) OSA: -Earnest Bailey is working well  3) Neuropathy: -HgbA1c reviewed and is 6.3  Pain Inventory Average Pain 8 Pain Right Now 4 My pain is burning and stabbing  In the last 24 hours, has pain interfered with the following? General activity 6 Relation with others 4 Enjoyment of life 9 What TIME of day is your pain at its worst? evening Sleep (in general) Fair  Pain is worse with: inactivity and standing Pain improves with: medication and injections Relief from Meds: 5  walk without assistance how many minutes can you walk? 5 ability to climb steps?  yes do you drive?  yes  retired  bladder control problems tremor trouble walking depression  no  New patient    Family History  Problem Relation Age of Onset   Esophageal cancer Father    Heart disease Father        Unknown what kind   Hyperlipidemia Father    Hypertension Father    Cancer Father    Heart failure Father    Hyperlipidemia Mother    Hypertension Mother    CAD Sister        10 years younger than patient - had stent. Was told that her anorexia/bulimia may have played a role   Stroke Maternal Grandmother 48   Diabetes Maternal Grandmother    Heart attack Maternal Grandfather 29   COPD Maternal Grandfather    Heart attack Paternal Grandmother        67s   Diabetes Other        MGGM   Social History   Socioeconomic History   Marital status: Married    Spouse name:  Ted   Number of children: 2   Years of education: Not on file   Highest education level: Master's degree (e.g., MA, MS, MEng, MEd, MSW, MBA)  Occupational History   Occupation: retired Runner, broadcasting/film/video  Tobacco Use   Smoking status: Former    Current packs/day: 0.00    Average packs/day: 0.5 packs/day for 10.0 years (5.0 ttl pk-yrs)    Types: Cigarettes    Start date: 12/11/1963    Quit date: 12/10/1973    Years since quitting: 49.1   Smokeless tobacco: Never  Vaping Use   Vaping status: Never Used  Substance and Sexual Activity   Alcohol use: Yes    Comment: occasional   Drug use: No   Sexual activity: Not on file  Other Topics Concern   Not on file  Social History Narrative   2 children ages 69,35   Social Determinants of Health   Financial Resource Strain: Low Risk  (12/23/2022)   Overall Financial Resource Strain (CARDIA)    Difficulty of Paying Living Expenses: Not hard at all  Food Insecurity: No Food Insecurity (12/23/2022)   Hunger Vital Sign    Worried About Running Out of Food in the Last Year:  Never true    Ran Out of Food in the Last Year: Never true  Transportation Needs: No Transportation Needs (12/23/2022)   PRAPARE - Administrator, Civil Service (Medical): No    Lack of Transportation (Non-Medical): No  Physical Activity: Inactive (12/23/2022)   Exercise Vital Sign    Days of Exercise per Week: 0 days    Minutes of Exercise per Session: 40 min  Stress: Stress Concern Present (12/23/2022)   [employer] of Occupational Health - Occupational Stress Questionnaire    Feeling of Stress : To some extent  Social Connections: Socially Integrated (12/23/2022)   Social Connection and Isolation Panel [NHANES]    Frequency of Communication with Friends and Family: More than three times a week    Frequency of Social Gatherings with Friends and Family: Twice a week    Attends Religious Services: More than 4 times per year    Active Member of Clubs or Organizations:  Yes    Attends Banker Meetings: More than 4 times per year    Marital Status: Married   Past Surgical History:  Procedure Laterality Date   APPENDECTOMY  07-02-2005   dr Zachery Dakins   open   CARDIAC CATHETERIZATION  01-23-2009  dr Smitty Cords brodie   normal coronary arteries and lvf   CARDIOVASCULAR STRESS TEST  03/31/2016   Low risk nuclear study w/ medium defect of mild severity in basal anterior and mid anterior location w/ no evidence ischemia or infarction/  normal LV function and wall motion,  nuclear stress ef 71%   CATARACT EXTRACTION W/ INTRAOCULAR LENS IMPLANT Left 09/2016   COLONOSCOPY  last one 04-24-2016   DRUG INDUCED ENDOSCOPY Bilateral 02/11/2022   Procedure: DRUG INDUCED ENDOSCOPY;  Surgeon: Christia Reading, MD;  Location: [facility];  Service: ENT;  Laterality: Bilateral;   HYSTEROSCOPY WITH D & C N/A 12/22/2016   Procedure: DILATATION AND CURETTAGE /HYSTEROSCOPY;  Surgeon: Marcelle Overlie, MD;  Location: [facility];  Service: Gynecology;  Laterality: N/A;   IMPLANTATION OF HYPOGLOSSAL NERVE STIMULATOR Right 04/21/2022   Procedure: IMPLANTATION OF HYPOGLOSSAL NERVE STIMULATOR;  Surgeon: Christia Reading, MD;  Location: [facility];  Service: ENT;  Laterality: Right;   IR US GUIDANCE  09/08/2016   POSTERIOR LUMBAR FUSION  02/ 2018    [facility] ([place])   SKIN CANCER EXCISION     TONSILLECTOMY  child   TOTAL KNEE ARTHROPLASTY Left 04/09/2015   Procedure: LEFT TOTAL KNEE ARTHROPLASTY;  Surgeon: Durene Romans, MD;  Location: [facility];  Service: Orthopedics;  Laterality: Left;   TRANSTHORACIC ECHOCARDIOGRAM  01/21/2009   mild LVH, ef 75-80%, grade 1 diastolic dysfunction   ULNAR NERVE TRANSPOSITION Left 12-20-2008  dr sypher   decompression and partial resection medial triceps fascia   WIDE LOCAL EXCISION PILONIDAL AREA  02-02-2002  dr Zachery Dakins   recurrent squamous cell carcinoma in situ   Past Medical History:   Diagnosis Date   Allergic rhinitis    Apnea    Arthritis    Asthma    very mild   Back pain    Bilateral lower extremity edema    Carotid artery disease (HCC)    a. Carotid duplex 03/2016 - duplex was stable, 40-59% RICA, 1-39% LICA   Complication of anesthesia    post-op delirium   Endometrial polyp    Essential hypertension    GERD (gastroesophageal reflux disease)    Heart murmur    per pt  History of adenomatous polyp of colon    04-24-2016  tubular adenoma   History of squamous cell carcinoma excision    pilonidal area second excision 02-02-2002   HTN (hypertension)    Hyperlipidemia    Leg pain    OAB (overactive bladder)    RA (rheumatoid arthritis) (HCC)    RBBB (right bundle branch block with left anterior fascicular block)    RLS (restless legs syndrome)    Shortness of breath on exertion    SUI (stress urinary incontinence, female)    Venous insufficiency    legs   Vitamin D deficiency    BP 113/75   Pulse 87   Ht 5\' 6"  (1.676 m)   Wt 216 lb (98 kg)   SpO2 95%   BMI 34.86 kg/m   Opioid Risk Score:   Fall Risk Score:  `1  Depression screen [facility] 2/9     01/08/2023   11:16 AM 12/24/2022    2:41 PM 12/10/2022    3:45 PM 12/08/2022    2:01 PM 06/05/2022    9:17 AM 05/18/2022    2:35 PM 01/09/2022    2:27 PM  Depression screen PHQ 2/9  Decreased Interest 2 2 2 2  0 1 0  Down, Depressed, Hopeless 2 2 2 2  0 1 0  PHQ - 2 Score 4 4 4 4  0 2 0  Altered sleeping  2 3 3  0 3 0  Tired, decreased energy  2 2 2  0 3 0  Change in appetite  1 2 2  0 1 0  Feeling bad or failure about yourself   2 2 2  0 0 0  Trouble concentrating  1 1 1  0 0 0  Moving slowly or fidgety/restless  0 0 0 0 0 0  Suicidal thoughts  0 0 0 0 0 0  PHQ-9 Score  12 14 14  0 9 0  Difficult doing work/chores  Somewhat difficult   Not difficult at all Somewhat difficult Not difficult at all      Review of Systems  Respiratory:  Positive for apnea.   Endocrine:       High/low bs   Musculoskeletal:        B/L leg pain  All other systems reviewed and are negative.      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   -continue Lyrica  Prescribed Zynex Nexwave and heating/cooling blanket   Pain contract and urine sample today  -continue ozempic  -Discussed Qutenza as an option for neuropathic pain control. Discussed that this is a capsaicin patch, stronger than capsaicin cream. Discussed that it is currently approved for diabetic peripheral neuropathy and post-herpetic neuralgia, but that it has also shown benefit in treating other forms of neuropathy. Provided patient with link to site to learn more about the patch: https://www.clark.biz/. Discussed that the patch would be placed in office and benefits usually last 3 months. Discussed that unintended exposure to capsaicin can cause severe irritation of eyes, mucous membranes, respiratory tract, and skin, but that Qutenza is a local treatment and does not have the systemic side effects of other nerve medications.  Discussed that there may be pain, itching, erythema, and decreased sensory function associated with the application of Qutenza. Side effects usually subside within 1 week. A cold pack of analgesic medications can help with these side effects. Blood pressure can also be increased due to pain associated with administration of the patch.   4 patches of Qutenza was applied to the area of pain. Ice packs were applied during the procedure to ensure patient comfort. Blood pressure was monitored every 15 minutes. The patient tolerated the procedure  well. Post-procedure instructions were given and follow-up has been scheduled.    -Discussed benefits of exercise in reducing pain. -Discussed following foods that may reduce pain: 1) Ginger (especially studied for arthritis)- reduce leukotriene production to decrease inflammation 2) Blueberries- high in phytonutrients that decrease inflammation 3) Salmon- marine omega-3s reduce joint swelling and pain 4) Pumpkin seeds- reduce inflammation 5) dark chocolate- reduces inflammation 6) turmeric- reduces inflammation 7) tart cherries - reduce pain and stiffness 8) extra virgin olive oil - its compound olecanthal helps to block prostaglandins  9) chili peppers- can be eaten or applied topically via capsaicin 10) mint- helpful for headache, muscle aches, joint pain, and itching 11) garlic- reduces inflammation  Link to further information on diet for chronic pain: http://www.bray.com/    2) Restless leg syndrome:  -continue requip 1mg  HS -restart Lyrica -discussed that she failed a patch before.   40 minutes spent in discussion of risks and benefits of Qutenza and obtaining informed consent, discussion of q90 day follow-up and expectation of improvement in pain with each repeat application, restless leg syndrome, discussed that stopping the lyrica one week ago likely contributed to her worsening neuropathy this past week, encouraged use of Nexwave, discussed response to reqiuip, that she takes hydrocodone, that she started to feel burning from the Qutenza patches at the end of the treatment

## 2023-01-08 NOTE — Addendum Note (Signed)
Addended by: Janean Sark on: 01/08/2023 12:19 PM   Modules accepted: Orders

## 2023-01-12 ENCOUNTER — Encounter: Payer: Self-pay | Admitting: Internal Medicine

## 2023-01-12 ENCOUNTER — Ambulatory Visit: Payer: Medicare PPO | Admitting: Internal Medicine

## 2023-01-13 ENCOUNTER — Encounter (HOSPITAL_COMMUNITY): Payer: Medicare PPO

## 2023-01-15 LAB — TOXASSURE SELECT,+ANTIDEPR,UR

## 2023-01-16 ENCOUNTER — Encounter: Payer: Self-pay | Admitting: Physical Medicine and Rehabilitation

## 2023-01-19 ENCOUNTER — Telehealth: Payer: Self-pay | Admitting: *Deleted

## 2023-01-19 ENCOUNTER — Other Ambulatory Visit: Payer: Self-pay | Admitting: Physical Medicine and Rehabilitation

## 2023-01-19 DIAGNOSIS — M545 Low back pain, unspecified: Secondary | ICD-10-CM

## 2023-01-19 MED ORDER — HYDROCODONE-ACETAMINOPHEN 5-325 MG PO TABS
1.0000 | ORAL_TABLET | Freq: Every day | ORAL | 0 refills | Status: DC
Start: 2023-01-19 — End: 2023-01-22

## 2023-01-19 NOTE — Telephone Encounter (Signed)
-----   Message from Horton Chin sent at 01/15/2023  9:41 AM EDT ----- Regarding: FW: Please let patient know we do not permit CBD products with opioids and ask if she prefers for [name] to prescribe hydrocodone for her or if she would prefer to continue her CBD products instead ----- Message ----- From: Interface, [facility] Lab Results In Sent: 01/15/2023   5:38 AM EDT To: Horton Chin, MD

## 2023-01-19 NOTE — Telephone Encounter (Signed)
UDS was positive for THC. I have spoken with Erin Roach and let her know that DR Carlis Abbott cannot prescribe if she is smoking marijuana or using CBD products. She is offering the choice between the [facility] or opioids. She states she has stopped as of the day seeing Dr Carlis Abbott and would like to have the pain medication. I advised that I will let Dr Carlis Abbott know and she will prescribe, but if she tests positive for future UDS we will  no longer prescribe. She understands.

## 2023-01-22 ENCOUNTER — Other Ambulatory Visit: Payer: Self-pay | Admitting: Physical Medicine and Rehabilitation

## 2023-01-22 DIAGNOSIS — M545 Low back pain, unspecified: Secondary | ICD-10-CM

## 2023-01-22 MED ORDER — HYDROCODONE-ACETAMINOPHEN 5-325 MG PO TABS: 1.0000 | ORAL_TABLET | Freq: Every day | ORAL | 0 refills | Status: DC

## 2023-01-24 ENCOUNTER — Other Ambulatory Visit: Payer: Self-pay | Admitting: Internal Medicine

## 2023-01-24 ENCOUNTER — Other Ambulatory Visit: Payer: Self-pay | Admitting: Cardiovascular Disease

## 2023-01-24 DIAGNOSIS — E1142 Type 2 diabetes mellitus with diabetic polyneuropathy: Secondary | ICD-10-CM | POA: Diagnosis not present

## 2023-01-24 DIAGNOSIS — G2581 Restless legs syndrome: Secondary | ICD-10-CM | POA: Diagnosis not present

## 2023-01-24 DIAGNOSIS — G894 Chronic pain syndrome: Secondary | ICD-10-CM | POA: Diagnosis not present

## 2023-01-26 ENCOUNTER — Encounter: Payer: Medicare PPO | Admitting: Physical Medicine and Rehabilitation

## 2023-01-29 ENCOUNTER — Encounter: Payer: Self-pay | Admitting: Registered Nurse

## 2023-01-29 ENCOUNTER — Encounter: Payer: Medicare PPO | Attending: Physical Medicine and Rehabilitation | Admitting: Registered Nurse

## 2023-01-29 VITALS — BP 133/83 | HR 81 | Ht 66.0 in | Wt 216.0 lb

## 2023-01-29 DIAGNOSIS — Z5181 Encounter for therapeutic drug level monitoring: Secondary | ICD-10-CM | POA: Diagnosis not present

## 2023-01-29 DIAGNOSIS — G894 Chronic pain syndrome: Secondary | ICD-10-CM | POA: Insufficient documentation

## 2023-01-29 DIAGNOSIS — G2581 Restless legs syndrome: Secondary | ICD-10-CM | POA: Insufficient documentation

## 2023-01-29 DIAGNOSIS — Z79891 Long term (current) use of opiate analgesic: Secondary | ICD-10-CM | POA: Diagnosis not present

## 2023-01-29 DIAGNOSIS — F0631 Mood disorder due to known physiological condition with depressive features: Secondary | ICD-10-CM | POA: Diagnosis not present

## 2023-01-29 DIAGNOSIS — G5793 Unspecified mononeuropathy of bilateral lower limbs: Secondary | ICD-10-CM | POA: Diagnosis not present

## 2023-01-29 NOTE — Progress Notes (Signed)
Subjective:    Patient ID: Erin Roach, female    DOB: Jun 27, 1940, 82 y.o.   MRN: [id number]  HPI: Erin Roach is a 82 y.o. female who returns for follow up appointment for chronic pain and medication refill. She states her pain is located in her bilateral lower extremities with tingling and reports she has restless leg . She rates her 10 pain. Her  current exercise regime is walking she was encouraged to increase her HEP as tolerated, she verbalizes understanding.   Erin Roach reports she is depressed with her physical illness, life of pain, she denies any suicidal ideation or plan. Emotional support given.   Erin Roach Morphine equivalent is 10.00 MME.   UDS ordered today   Pain Inventory Average Pain 10 Pain Right Now 10 My pain is intermittent, sharp, burning, and stabbing  In the last 24 hours, has pain interfered with the following? General activity 0 Relation with others 0 Enjoyment of life 0 What TIME of day is your pain at its worst? evening Sleep (in general) Poor  Pain is worse with: inactivity Pain improves with: rest, pacing activities, and medication Relief from Meds: 3  Family History  Problem Relation Age of Onset   Esophageal cancer Father    Heart disease Father        Unknown what kind   Hyperlipidemia Father    Hypertension Father    Cancer Father    Heart failure Father    Hyperlipidemia Mother    Hypertension Mother    CAD Sister        10 years younger than patient - had stent. Was told that her anorexia/bulimia may have played a role   Stroke Maternal Grandmother 81   Diabetes Maternal Grandmother    Heart attack Maternal Grandfather 42   COPD Maternal Grandfather    Heart attack Paternal Grandmother        62s   Diabetes Other        MGGM   Social History   Socioeconomic History   Marital status: Married    Spouse name: Ted   Number of children: 2   Years of education: Not on file   Highest education level: Master's degree  (e.g., MA, MS, MEng, MEd, MSW, MBA)  Occupational History   Occupation: retired Runner, broadcasting/film/video  Tobacco Use   Smoking status: Former    Current packs/day: 0.00    Average packs/day: 0.5 packs/day for 10.0 years (5.0 ttl pk-yrs)    Types: Cigarettes    Start date: 12/11/1963    Quit date: 12/10/1973    Years since quitting: 49.1   Smokeless tobacco: Never  Vaping Use   Vaping status: Never Used  Substance and Sexual Activity   Alcohol use: Yes    Comment: occasional   Drug use: No   Sexual activity: Not on file  Other Topics Concern   Not on file  Social History Narrative   2 children ages 85,35   Social Determinants of Health   Financial Resource Strain: Low Risk  (12/23/2022)   Overall Financial Resource Strain (CARDIA)    Difficulty of Paying Living Expenses: Not hard at all  Food Insecurity: No Food Insecurity (12/23/2022)   Hunger Vital Sign    Worried About Running Out of Food in the Last Year: Never true    Ran Out of Food in the Last Year: Never true  Transportation Needs: No Transportation Needs (12/23/2022)   PRAPARE - Transportation    Lack  of Transportation (Medical): No    Lack of Transportation (Non-Medical): No  Physical Activity: Inactive (12/23/2022)   Exercise Vital Sign    Days of Exercise per Week: 0 days    Minutes of Exercise per Session: 40 min  Stress: Stress Concern Present (12/23/2022)   [employer] of Occupational Health - Occupational Stress Questionnaire    Feeling of Stress : To some extent  Social Connections: Socially Integrated (12/23/2022)   Social Connection and Isolation Panel [NHANES]    Frequency of Communication with Friends and Family: More than three times a week    Frequency of Social Gatherings with Friends and Family: Twice a week    Attends Religious Services: More than 4 times per year    Active Member of Clubs or Organizations: Yes    Attends Banker Meetings: More than 4 times per year    Marital Status: Married    Past Surgical History:  Procedure Laterality Date   APPENDECTOMY  07-02-2005   dr Zachery Dakins   open   CARDIAC CATHETERIZATION  01-23-2009  dr Smitty Cords brodie   normal coronary arteries and lvf   CARDIOVASCULAR STRESS TEST  03/31/2016   Low risk nuclear study w/ medium defect of mild severity in basal anterior and mid anterior location w/ no evidence ischemia or infarction/  normal LV function and wall motion,  nuclear stress ef 71%   CATARACT EXTRACTION W/ INTRAOCULAR LENS IMPLANT Left 09/2016   COLONOSCOPY  last one 04-24-2016   DRUG INDUCED ENDOSCOPY Bilateral 02/11/2022   Procedure: DRUG INDUCED ENDOSCOPY;  Surgeon: Christia Reading, MD;  Location: [facility];  Service: ENT;  Laterality: Bilateral;   HYSTEROSCOPY WITH D & C N/A 12/22/2016   Procedure: DILATATION AND CURETTAGE /HYSTEROSCOPY;  Surgeon: Marcelle Overlie, MD;  Location: [facility];  Service: Gynecology;  Laterality: N/A;   IMPLANTATION OF HYPOGLOSSAL NERVE STIMULATOR Right 04/21/2022   Procedure: IMPLANTATION OF HYPOGLOSSAL NERVE STIMULATOR;  Surgeon: Christia Reading, MD;  Location: [facility];  Service: ENT;  Laterality: Right;   IR US GUIDANCE  09/08/2016   POSTERIOR LUMBAR FUSION  02/ 2018    [facility] ([place], [place])   SKIN CANCER EXCISION     TONSILLECTOMY  child   TOTAL KNEE ARTHROPLASTY Left 04/09/2015   Procedure: LEFT TOTAL KNEE ARTHROPLASTY;  Surgeon: Durene Romans, MD;  Location: [facility];  Service: Orthopedics;  Laterality: Left;   TRANSTHORACIC ECHOCARDIOGRAM  01/21/2009   mild LVH, ef 75-80%, grade 1 diastolic dysfunction   ULNAR NERVE TRANSPOSITION Left 12-20-2008  dr sypher   decompression and partial resection medial triceps fascia   WIDE LOCAL EXCISION PILONIDAL AREA  02-02-2002  dr Zachery Dakins   recurrent squamous cell carcinoma in situ   Past Surgical History:  Procedure Laterality Date   APPENDECTOMY  07-02-2005   dr weatherly   open   CARDIAC CATHETERIZATION   01-23-2009  dr Smitty Cords brodie   normal coronary arteries and lvf   CARDIOVASCULAR STRESS TEST  03/31/2016   Low risk nuclear study w/ medium defect of mild severity in basal anterior and mid anterior location w/ no evidence ischemia or infarction/  normal LV function and wall motion,  nuclear stress ef 71%   CATARACT EXTRACTION W/ INTRAOCULAR LENS IMPLANT Left 09/2016   COLONOSCOPY  last one 04-24-2016   DRUG INDUCED ENDOSCOPY Bilateral 02/11/2022   Procedure: DRUG INDUCED ENDOSCOPY;  Surgeon: Christia Reading, MD;  Location: [facility];  Service: ENT;  Laterality: Bilateral;  HYSTEROSCOPY WITH D & C N/A 12/22/2016   Procedure: DILATATION AND CURETTAGE /HYSTEROSCOPY;  Surgeon: Marcelle Overlie, MD;  Location: [facility];  Service: Gynecology;  Laterality: N/A;   IMPLANTATION OF HYPOGLOSSAL NERVE STIMULATOR Right 04/21/2022   Procedure: IMPLANTATION OF HYPOGLOSSAL NERVE STIMULATOR;  Surgeon: Christia Reading, MD;  Location: [facility];  Service: ENT;  Laterality: Right;   IR US GUIDANCE  09/08/2016   POSTERIOR LUMBAR FUSION  02/ 2018    [facility] ([place], [place])   SKIN CANCER EXCISION     TONSILLECTOMY  child   TOTAL KNEE ARTHROPLASTY Left 04/09/2015   Procedure: LEFT TOTAL KNEE ARTHROPLASTY;  Surgeon: Durene Romans, MD;  Location[facility];  Service: Orthopedics;  Laterality: Left;   TRANSTHORACIC ECHOCARDIOGRAM  01/21/2009   mild LVH, ef 75-80%, grade 1 diastolic dysfunction   ULNAR NERVE TRANSPOSITION Left 12-20-2008  dr sypher   decompression and partial resection medial triceps fascia   WIDE LOCAL EXCISION PILONIDAL AREA  02-02-2002  dr Zachery Dakins   recurrent squamous cell carcinoma in situ   Past Medical History:  Diagnosis Date   Allergic rhinitis    Apnea    Arthritis    Asthma    very mild   Back pain    Bilateral lower extremity edema    Carotid artery disease (HCC)    a. Carotid duplex 03/2016 - duplex was stable, 40-59% RICA, 1-39%  LICA   Complication of anesthesia    post-op delirium   Endometrial polyp    Essential hypertension    GERD (gastroesophageal reflux disease)    Heart murmur    per pt   History of adenomatous polyp of colon    04-24-2016  tubular adenoma   History of squamous cell carcinoma excision    pilonidal area second excision 02-02-2002   HTN (hypertension)    Hyperlipidemia    Leg pain    OAB (overactive bladder)    RA (rheumatoid arthritis) (HCC)    RBBB (right bundle branch block with left anterior fascicular block)    RLS (restless legs syndrome)    Shortness of breath on exertion    SUI (stress urinary incontinence, female)    Venous insufficiency    legs   Vitamin D deficiency    BP 133/83   Pulse 81   Ht 5\' 6"  (1.676 m)   Wt 216 lb (98 kg)   SpO2 96%   BMI 34.86 kg/m   Opioid Risk Score:   Fall Risk Score:  `1  Depression screen [facility] 2/9     01/29/2023    2:01 PM 01/08/2023   11:16 AM 12/24/2022    2:41 PM 12/10/2022    3:45 PM 12/08/2022    2:01 PM 06/05/2022    9:17 AM 05/18/2022    2:35 PM  Depression screen PHQ 2/9  Decreased Interest 0 2 2 2 2  0 1  Down, Depressed, Hopeless 0 2 2 2 2  0 1  PHQ - 2 Score 0 4 4 4 4  0 2  Altered sleeping   2 3 3  0 3  Tired, decreased energy   2 2 2  0 3  Change in appetite   1 2 2  0 1  Feeling bad or failure about yourself    2 2 2  0 0  Trouble concentrating   1 1 1  0 0  Moving slowly or fidgety/restless   0 0 0 0 0  Suicidal thoughts   0 0 0 0 0  PHQ-9 Score   12 14 14  0 9  Difficult doing work/chores   Somewhat difficult   Not difficult at all Somewhat difficult      Review of Systems  Musculoskeletal:  Positive for gait problem.  All other systems reviewed and are negative.     Objective:   Physical Exam Vitals and nursing note reviewed.  Constitutional:      Appearance: Normal appearance.  Cardiovascular:     Rate and Rhythm: Normal rate and regular rhythm.     Pulses: Normal pulses.     Heart sounds: Normal heart  sounds.  Pulmonary:     Effort: Pulmonary effort is normal.     Breath sounds: Normal breath sounds.  Musculoskeletal:     Cervical back: Normal range of motion and neck supple.     Comments: Normal Muscle Bulk and Muscle Testing Reveals:  Upper Extremities:Full  ROM and Muscle Strength 5/5 Lower Extremities: Full ROM and Muscle Strength 5/5 Arises from Table with ease Narrow Based  Gait     Skin:    General: Skin is warm and dry.  Neurological:     Mental Status: She is alert and oriented to person, place, and time.  Psychiatric:        Mood and Affect: Mood normal.        Behavior: Behavior normal.           Assessment & Plan:  Restless Leg Syndrome: Continue Requip. Continue to Monitor.  Neuropathic Pain of Bilateral Lower Extremities: Continue Pregabalin. She is scheduled for Qutenza with Dr Garnet Sierras. Continue to Monitor.  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 Caldon states her pain has increased in intensity, she is compliant with her medication regimen. She denies suicidal ideation or plan. She was encouraged to obtain counselor, she verbalizes understanding. Continue to Monitor.   F/U with Dr Carlis Abbott

## 2023-01-30 ENCOUNTER — Other Ambulatory Visit: Payer: Self-pay | Admitting: Internal Medicine

## 2023-02-04 LAB — TOXASSURE SELECT,+ANTIDEPR,UR

## 2023-02-06 ENCOUNTER — Encounter: Payer: Self-pay | Admitting: Physical Medicine and Rehabilitation

## 2023-02-11 ENCOUNTER — Encounter: Payer: Medicare PPO | Attending: Physical Medicine and Rehabilitation | Admitting: Physical Medicine and Rehabilitation

## 2023-02-11 ENCOUNTER — Encounter: Payer: Self-pay | Admitting: Physical Medicine and Rehabilitation

## 2023-02-11 VITALS — BP 137/84 | HR 83 | Ht 66.0 in | Wt 215.0 lb

## 2023-02-11 DIAGNOSIS — G5793 Unspecified mononeuropathy of bilateral lower limbs: Secondary | ICD-10-CM | POA: Insufficient documentation

## 2023-02-11 DIAGNOSIS — M545 Low back pain, unspecified: Secondary | ICD-10-CM | POA: Insufficient documentation

## 2023-02-11 DIAGNOSIS — G629 Polyneuropathy, unspecified: Secondary | ICD-10-CM | POA: Diagnosis not present

## 2023-02-11 MED ORDER — HYDROCODONE-ACETAMINOPHEN 5-325 MG PO TABS
1.0000 | ORAL_TABLET | Freq: Every day | ORAL | 0 refills | Status: DC
Start: 2023-02-11 — End: 2023-03-22

## 2023-02-11 MED ORDER — ROPINIROLE HCL 2 MG PO TABS: 2.0000 mg | ORAL_TABLET | Freq: Every day | ORAL | 3 refills | Status: DC

## 2023-02-11 NOTE — Patient Instructions (Signed)
Methylated B vitamin methylfolate NOT folic acid Green leafy vegetables, lentils, chickpeas, nuts

## 2023-02-11 NOTE — Progress Notes (Signed)
Subjective:    Patient ID: Erin Roach, female    DOB: [birth year], 82 y.o.   MRN: [id number]  HPI Erin Roach is an 82 year old woman who presents with restless leg syndrome and neuropathy  1) Restless leg syndrome: -still very severe, she would like to increase the dose of Requip at night -first noted when she was 32 and was pregnant with her first child -she she retired at age 82 her symptoms returned and it seems to be getting worse -had an iron infusion and this did not help -she has been started on Requip, she is taking 0.6mg  at night -was hurting for 22 hours two days a ago.  -she feels she needs something for pain -she is not sure that the Requip was helping -she would like dose of Requip increased  2) OSA: -Erin Roach is working well  3) Neuropathy: -HgbA1c reviewed and is 6.3 -taking Lyrica, she has not noted benefit from restarting this -she has not noted any benefits from Qutenza yet  Pain Inventory Average Pain 8 Pain Right Now 4 My pain is burning and stabbing  In the last 24 hours, has pain interfered with the following? General activity 6 Relation with others 4 Enjoyment of life 9 What TIME of day is your pain at its worst? evening Sleep (in general) Fair  Pain is worse with: inactivity and standing Pain improves with: medication and injections Relief from Meds: 5  walk without assistance how many minutes can you walk? 5 ability to climb steps?  yes do you drive?  yes  retired  bladder control problems tremor trouble walking depression  no  New patient    Family History  Problem Relation Age of Onset   Esophageal cancer Father    Heart disease Father        Unknown what kind   Hyperlipidemia Father    Hypertension Father    Cancer Father    Heart failure Father    Hyperlipidemia Mother    Hypertension Mother    CAD Sister        10 years younger than patient - had stent. Was told that her anorexia/bulimia may have played a  role   Stroke Maternal Grandmother 66   Diabetes Maternal Grandmother    Heart attack Maternal Grandfather 61   COPD Maternal Grandfather    Heart attack Paternal Grandmother        53s   Diabetes Other        MGGM   Social History   Socioeconomic History   Marital status: Married    Spouse name: Ted   Number of children: 2   Years of education: Not on file   Highest education level: Master's degree (e.g., MA, MS, MEng, MEd, MSW, MBA)  Occupational History   Occupation: retired Runner, broadcasting/film/video  Tobacco Use   Smoking status: Former    Current packs/day: 0.00    Average packs/day: 0.5 packs/day for 10.0 years (5.0 ttl pk-yrs)    Types: Cigarettes    Start date: 12/11/1963    Quit date: 12/10/1973    Years since quitting: 49.2   Smokeless tobacco: Never  Vaping Use   Vaping status: Never Used  Substance and Sexual Activity   Alcohol use: Yes    Comment: occasional   Drug use: No   Sexual activity: Not on file  Other Topics Concern   Not on file  Social History Narrative   2 children ages 109,35   Social Determinants of  Health   Financial Resource Strain: Low Risk  (12/23/2022)   Overall Financial Resource Strain (CARDIA)    Difficulty of Paying Living Expenses: Not hard at all  Food Insecurity: No Food Insecurity (12/23/2022)   Hunger Vital Sign    Worried About Running Out of Food in the Last Year: Never true    Ran Out of Food in the Last Year: Never true  Transportation Needs: No Transportation Needs (12/23/2022)   PRAPARE - Administrator, Civil Service (Medical): No    Lack of Transportation (Non-Medical): No  Physical Activity: Inactive (12/23/2022)   Exercise Vital Sign    Days of Exercise per Week: 0 days    Minutes of Exercise per Session: 40 min  Stress: Stress Concern Present (12/23/2022)   [employer] of Occupational Health - Occupational Stress Questionnaire    Feeling of Stress : To some extent  Social Connections: Socially Integrated (12/23/2022)    Social Connection and Isolation Panel [NHANES]    Frequency of Communication with Friends and Family: More than three times a week    Frequency of Social Gatherings with Friends and Family: Twice a week    Attends Religious Services: More than 4 times per year    Active Member of Clubs or Organizations: Yes    Attends Banker Meetings: More than 4 times per year    Marital Status: Married   Past Surgical History:  Procedure Laterality Date   APPENDECTOMY  07-02-2005   dr Zachery Dakins   open   CARDIAC CATHETERIZATION  01-23-2009  dr Smitty Cords brodie   normal coronary arteries and lvf   CARDIOVASCULAR STRESS TEST  03/31/2016   Low risk nuclear study w/ medium defect of mild severity in basal anterior and mid anterior location w/ no evidence ischemia or infarction/  normal LV function and wall motion,  nuclear stress ef 71%   CATARACT EXTRACTION W/ INTRAOCULAR LENS IMPLANT Left 09/2016   COLONOSCOPY  last one 04-24-2016   DRUG INDUCED ENDOSCOPY Bilateral 02/11/2022   Procedure: DRUG INDUCED ENDOSCOPY;  Surgeon: Christia Reading, MD;  Location: [facility];  Service: ENT;  Laterality: Bilateral;   HYSTEROSCOPY WITH D & C N/A 12/22/2016   Procedure: DILATATION AND CURETTAGE /HYSTEROSCOPY;  Surgeon: Marcelle Overlie, MD;  Location: [facility];  Service: Gynecology;  Laterality: N/A;   IMPLANTATION OF HYPOGLOSSAL NERVE STIMULATOR Right 04/21/2022   Procedure: IMPLANTATION OF HYPOGLOSSAL NERVE STIMULATOR;  Surgeon: Christia Reading, MD;  Location: [facility];  Service: ENT;  Laterality: Right;   IR US GUIDANCE  09/08/2016   POSTERIOR LUMBAR FUSION  02/ 2018    [facility] ([place], [place])   SKIN CANCER EXCISION     TONSILLECTOMY  child   TOTAL KNEE ARTHROPLASTY Left 04/09/2015   Procedure: LEFT TOTAL KNEE ARTHROPLASTY;  Surgeon: Durene Romans, MD;  Location: [facility];  Service: Orthopedics;  Laterality: Left;   TRANSTHORACIC ECHOCARDIOGRAM  01/21/2009    mild LVH, ef 75-80%, grade 1 diastolic dysfunction   ULNAR NERVE TRANSPOSITION Left 12-20-2008  dr sypher   decompression and partial resection medial triceps fascia   WIDE LOCAL EXCISION PILONIDAL AREA  02-02-2002  dr Zachery Dakins   recurrent squamous cell carcinoma in situ   Past Medical History:  Diagnosis Date   Allergic rhinitis    Apnea    Arthritis    Asthma    very mild   Back pain    Bilateral lower extremity edema    Carotid  artery disease (HCC)    a. Carotid duplex 03/2016 - duplex was stable, 40-59% RICA, 1-39% LICA   Complication of anesthesia    post-op delirium   Endometrial polyp    Essential hypertension    GERD (gastroesophageal reflux disease)    Heart murmur    per pt   History of adenomatous polyp of colon    04-24-2016  tubular adenoma   History of squamous cell carcinoma excision    pilonidal area second excision 02-02-2002   HTN (hypertension)    Hyperlipidemia    Leg pain    OAB (overactive bladder)    RA (rheumatoid arthritis) (HCC)    RBBB (right bundle branch block with left anterior fascicular block)    RLS (restless legs syndrome)    Shortness of breath on exertion    SUI (stress urinary incontinence, female)    Venous insufficiency    legs   Vitamin D deficiency    BP 137/84   Pulse 83   Ht 5\' 6"  (1.676 m)   Wt 215 lb (97.5 kg)   SpO2 96%   BMI 34.70 kg/m   Opioid Risk Score:   Fall Risk Score:  `1  Depression screen [facility] 2/9     01/29/2023    2:01 PM 01/08/2023   11:16 AM 12/24/2022    2:41 PM 12/10/2022    3:45 PM 12/08/2022    2:01 PM 06/05/2022    9:17 AM 05/18/2022    2:35 PM  Depression screen PHQ 2/9  Decreased Interest 0 2 2 2 2  0 1  Down, Depressed, Hopeless 0 2 2 2 2  0 1  PHQ - 2 Score 0 4 4 4 4  0 2  Altered sleeping   2 3 3  0 3  Tired, decreased energy   2 2 2  0 3  Change in appetite   1 2 2  0 1  Feeling bad or failure about yourself    2 2 2  0 0  Trouble concentrating   1 1 1  0 0  Moving slowly or  fidgety/restless   0 0 0 0 0  Suicidal thoughts   0 0 0 0 0  PHQ-9 Score   12 14 14  0 9  Difficult doing work/chores   Somewhat difficult   Not difficult at all Somewhat difficult      Review of Systems  Respiratory:  Positive for apnea.   Endocrine:       High/low bs  Musculoskeletal:        B/L leg pain  All other systems reviewed and are negative.      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   -hydrocodone refilled  -continue Lyrica  -discussed that THC was still present in the urine.   Prescribed Zynex Nexwave and heating/cooling blanket   Pain contract and urine sample today  -continue ozempic  -Discussed Qutenza as an option for neuropathic pain control. Discussed that this is a capsaicin patch, stronger than capsaicin cream. Discussed that it is currently approved for diabetic peripheral neuropathy and post-herpetic neuralgia, but that it has also shown benefit in treating other forms  of neuropathy. Provided patient with link to site to learn more about the patch: https://www.clark.biz/. Discussed that the patch would be placed in office and benefits usually last 3 months. Discussed that unintended exposure to capsaicin can cause severe irritation of eyes, mucous membranes, respiratory tract, and skin, but that Qutenza is a local treatment and does not have the systemic side effects of other nerve medications. Discussed that there may be pain, itching, erythema, and decreased sensory function associated with the application of Qutenza. Side effects usually subside within 1 week. A cold pack of  analgesic medications can help with these side effects. Blood pressure can also be increased due to pain associated with administration of the patch.   -Discussed benefits of exercise in reducing pain. -Discussed following foods that may reduce pain: 1) Ginger (especially studied for arthritis)- reduce leukotriene production to decrease inflammation 2) Blueberries- high in phytonutrients that decrease inflammation 3) Salmon- marine omega-3s reduce joint swelling and pain 4) Pumpkin seeds- reduce inflammation 5) dark chocolate- reduces inflammation 6) turmeric- reduces inflammation 7) tart cherries - reduce pain and stiffness 8) extra virgin olive oil - its compound olecanthal helps to block prostaglandins  9) chili peppers- can be eaten or applied topically via capsaicin 10) mint- helpful for headache, muscle aches, joint pain, and itching 11) garlic- reduces inflammation  Link to further information on diet for chronic pain: http://www.bray.com/    2) Restless leg syndrome:  -increase Requip to 2mg  HS -restart Lyrica -discussed that she failed a patch before.

## 2023-02-15 ENCOUNTER — Other Ambulatory Visit: Payer: Self-pay | Admitting: Physical Medicine and Rehabilitation

## 2023-02-15 MED ORDER — ROPINIROLE HCL 3 MG PO TABS: 3.0000 mg | ORAL_TABLET | Freq: Every day | ORAL | 3 refills | Status: DC

## 2023-02-16 DIAGNOSIS — I739 Peripheral vascular disease, unspecified: Secondary | ICD-10-CM | POA: Diagnosis not present

## 2023-02-16 DIAGNOSIS — L603 Nail dystrophy: Secondary | ICD-10-CM | POA: Diagnosis not present

## 2023-02-16 DIAGNOSIS — B351 Tinea unguium: Secondary | ICD-10-CM | POA: Diagnosis not present

## 2023-02-16 DIAGNOSIS — L84 Corns and callosities: Secondary | ICD-10-CM | POA: Diagnosis not present

## 2023-02-23 DIAGNOSIS — G2581 Restless legs syndrome: Secondary | ICD-10-CM | POA: Diagnosis not present

## 2023-02-23 DIAGNOSIS — G894 Chronic pain syndrome: Secondary | ICD-10-CM | POA: Diagnosis not present

## 2023-02-23 DIAGNOSIS — E1142 Type 2 diabetes mellitus with diabetic polyneuropathy: Secondary | ICD-10-CM | POA: Diagnosis not present

## 2023-03-15 ENCOUNTER — Telehealth: Payer: Self-pay | Admitting: Pharmacy Technician

## 2023-03-15 ENCOUNTER — Other Ambulatory Visit: Payer: Self-pay | Admitting: Internal Medicine

## 2023-03-15 NOTE — Telephone Encounter (Signed)
Auth Submission: APPROVED [place] RENEWAL Site of care: Site of care: [facility] Payer: [employer] MEDICARE Medication & CPT/J Code(s) submitted: Monoferric (Ferrci derisomaltose) [phone] Route of submission (phone, fax, portal): PORTAL Phone # Fax # Auth type: Buy/Bill PB Units/visits requested: X1 DOSE (AS NEEDED) Reference number: [id number] Approval from: 05/12/23 to 05/10/24

## 2023-03-18 ENCOUNTER — Telehealth: Payer: Self-pay | Admitting: Physician Assistant

## 2023-03-18 NOTE — Telephone Encounter (Signed)
Patient called and needs a refill on lidocaine patches strength 5. CB#[phone] to the pharmacy [facility] on [address].

## 2023-03-21 ENCOUNTER — Other Ambulatory Visit: Payer: Self-pay

## 2023-03-21 ENCOUNTER — Emergency Department (HOSPITAL_BASED_OUTPATIENT_CLINIC_OR_DEPARTMENT_OTHER)
Admission: EM | Admit: 2023-03-21 | Discharge: 2023-03-21 | Disposition: A | Discharge status: Home / Self Care | Payer: Medicare PPO | Attending: Emergency Medicine | Admitting: Emergency Medicine

## 2023-03-21 ENCOUNTER — Encounter (HOSPITAL_BASED_OUTPATIENT_CLINIC_OR_DEPARTMENT_OTHER): Payer: Self-pay | Admitting: Emergency Medicine

## 2023-03-21 DIAGNOSIS — R600 Localized edema: Secondary | ICD-10-CM | POA: Insufficient documentation

## 2023-03-21 DIAGNOSIS — Z7982 Long term (current) use of aspirin: Secondary | ICD-10-CM | POA: Insufficient documentation

## 2023-03-21 DIAGNOSIS — N390 Urinary tract infection, site not specified: Secondary | ICD-10-CM | POA: Diagnosis not present

## 2023-03-21 DIAGNOSIS — Z1152 Encounter for screening for COVID-19: Secondary | ICD-10-CM | POA: Diagnosis not present

## 2023-03-21 DIAGNOSIS — I10 Essential (primary) hypertension: Secondary | ICD-10-CM | POA: Insufficient documentation

## 2023-03-21 DIAGNOSIS — R319 Hematuria, unspecified: Secondary | ICD-10-CM | POA: Insufficient documentation

## 2023-03-21 LAB — URINALYSIS, W/ REFLEX TO CULTURE (INFECTION SUSPECTED)
Bilirubin Urine: NEGATIVE
Glucose, UA: NEGATIVE mg/dL
Hgb urine dipstick: NEGATIVE
Ketones, ur: NEGATIVE mg/dL
Nitrite: POSITIVE — AB
Protein, ur: NEGATIVE mg/dL
Specific Gravity, Urine: 1.011 (ref 1.005–1.030)
pH: 5 (ref 5.0–8.0)

## 2023-03-21 LAB — BASIC METABOLIC PANEL
Anion gap: 8 (ref 5–15)
BUN: 22 mg/dL (ref 8–23)
CO2: 23 mmol/L (ref 22–32)
Calcium: 9.7 mg/dL (ref 8.9–10.3)
Chloride: 106 mmol/L (ref 98–111)
Creatinine, Ser: 1.03 mg/dL — ABNORMAL HIGH (ref 0.44–1.00)
GFR, Estimated: 55 mL/min — ABNORMAL LOW (ref >60)
Glucose, Bld: 164 mg/dL — ABNORMAL HIGH (ref 70–99)
Potassium: 4.7 mmol/L (ref 3.5–5.1)
Sodium: 137 mmol/L (ref 135–145)

## 2023-03-21 LAB — CBC WITH DIFFERENTIAL/PLATELET
Abs Immature Granulocytes: 0.04 10*3/uL (ref 0.00–0.07)
Basophils Absolute: 0.1 10*3/uL (ref 0.0–0.1)
Basophils Relative: 1 %
Eosinophils Absolute: 0.2 10*3/uL (ref 0.0–0.5)
Eosinophils Relative: 3 %
HCT: 37.8 % (ref 36.0–46.0)
Hemoglobin: 13 g/dL (ref 12.0–15.0)
Immature Granulocytes: 1 %
Lymphocytes Relative: 21 %
Lymphs Abs: 1.5 10*3/uL (ref 0.7–4.0)
MCH: 31.3 pg (ref 26.0–34.0)
MCHC: 34.4 g/dL (ref 30.0–36.0)
MCV: 90.9 fL (ref 80.0–100.0)
Monocytes Absolute: 0.5 10*3/uL (ref 0.1–1.0)
Monocytes Relative: 7 %
Neutro Abs: 5 10*3/uL (ref 1.7–7.7)
Neutrophils Relative %: 67 %
Platelets: 215 10*3/uL (ref 150–400)
RBC: 4.16 MIL/uL (ref 3.87–5.11)
RDW: 12.3 % (ref 11.5–15.5)
WBC: 7.3 10*3/uL (ref 4.0–10.5)
nRBC: 0 % (ref 0.0–0.2)

## 2023-03-21 LAB — RESP PANEL BY RT-PCR (RSV, FLU A&B, COVID)  RVPGX2
Influenza A by PCR: NEGATIVE
Influenza B by PCR: NEGATIVE
Resp Syncytial Virus by PCR: NEGATIVE
SARS Coronavirus 2 by RT PCR: NEGATIVE

## 2023-03-21 LAB — CBG MONITORING, ED: Glucose-Capillary: 148 mg/dL — ABNORMAL HIGH (ref 70–99)

## 2023-03-21 MED ORDER — NITROFURANTOIN MONOHYD MACRO 100 MG PO CAPS
100.0000 mg | ORAL_CAPSULE | Freq: Once | ORAL | Status: AC
  Administered 2023-03-21: 100 mg via ORAL
  Filled 2023-03-21: qty 1

## 2023-03-21 MED ORDER — NITROFURANTOIN MONOHYD MACRO 100 MG PO CAPS: 100.0000 mg | ORAL_CAPSULE | Freq: Two times a day (BID) | ORAL | 0 refills | Status: DC

## 2023-03-21 NOTE — ED Provider Notes (Signed)
[facility] Provider Note   CSN: [id number] Arrival date & time: 03/21/23  1552     History  No chief complaint on file.   Erin Roach is a 82 y.o. female.  Patient is an 82 year old female with a history of venous insufficiency, restless legs, hyperlipidemia, hypertension, chronic lower extremity edema and right bundle branch block who is presenting today with complaints of elevated blood pressure and blood sugar.  She reports all days she has just been tired and has not felt herself.  However she has not had headache, nausea, vomiting, chest pain, shortness of breath, abdominal pain.  She has not noticed any urinary urgency or frequency.  She reports taking all of her medications as prescribed she has not had recent medication changes and denies any dietary changes.  She did eat a bagel with cream cheese this morning but otherwise had a high-protein meal for lunch.  She checked her blood sugar this afternoon and it was 265 when normally its about 119 and her blood pressure was 188/66.  The history is provided by the patient.       Home Medications Prior to Admission medications   Medication Sig Start Date End Date Taking? Authorizing Provider  nitrofurantoin, macrocrystal-monohydrate, (MACROBID) 100 MG capsule Take 1 capsule (100 mg total) by mouth 2 (two) times daily. 03/21/23  Yes Alissandra Geoffroy, Alphonzo Lemmings, MD  albuterol (PROVENTIL) (2.5 MG/3ML) 0.083% nebulizer solution Take 3 mLs (2.5 mg total) by nebulization every 6 (six) hours as needed for wheezing or shortness of breath. 06/19/22   Pincus Sanes, MD  albuterol (VENTOLIN HFA) 108 (90 Base) MCG/ACT inhaler Inhale 2 puffs into the lungs every 6 (six) hours as needed for wheezing or shortness of breath. 06/11/22   Pincus Sanes, MD  aspirin EC 81 MG tablet Take 1 tablet (81 mg total) by mouth daily. Swallow whole. 11/05/21   Pincus Sanes, MD  benzonatate (TESSALON) 100 MG capsule Take 1  capsule (100 mg total) by mouth 2 (two) times daily as needed for cough. 12/24/22   Deeann Saint, MD  cholecalciferol (VITAMIN D3) 25 MCG (1000 UNIT) tablet Take 1,000 Units by mouth daily.    [provider]  co-enzyme Q-10 30 MG capsule Take 30 mg by mouth daily.    [provider]  Eszopiclone 3 MG TABS TAKE 1 TABLET BY MOUTH IMMEDIATELY BEFORE BEDTIME 12/07/22   Pincus Sanes, MD  ezetimibe (ZETIA) 10 MG tablet TAKE 1 TABLET BY MOUTH DAILY 01/25/23   Joylene Grapes, NP  fluticasone (FLONASE) 50 MCG/ACT nasal spray Place 1 spray into both nostrils daily. 12/24/22   Deeann Saint, MD  furosemide (LASIX) 20 MG tablet TAKE 1 TABLET BY MOUTH DAILY 10/26/22   Pincus Sanes, MD  HORIZANT 600 MG TBCR TAKE 1 TABLET BY MOUTH AT BEDTIME 10/26/22   Pincus Sanes, MD  hydrALAZINE (APRESOLINE) 50 MG tablet TAKE ONE TABLET BY MOUTH THREE TIMES A DAY 09/21/22   Joylene Grapes, NP  HYDROcodone-acetaminophen (NORCO/VICODIN) 5-325 MG tablet Take 1-2 tablets by mouth at bedtime. 02/11/23   Raulkar, Drema Pry, MD  losartan (COZAAR) 50 MG tablet TAKE 1 TABLET BY MOUTH DAILY 11/16/22   Pincus Sanes, MD  magnesium gluconate (MAGONATE) 500 MG tablet Take 500 mg by mouth 2 (two) times daily.    [provider]  OZEMPIC, 2 MG/DOSE, 8 MG/3ML SOPN DIAL AND INJECT UNDER THE SKIN 2 MG WEEKLY 03/15/23  Pincus Sanes, MD  pregabalin (LYRICA) 75 MG capsule TAKE 1 CAPSULE BY MOUTH EVERY AFTERNOON AND TAKE 2 CAPSULES BY MOUTH EVERY NIGHT AT BEDTIME 01/08/23   Raulkar, Drema Pry, MD  rOPINIRole (REQUIP) 3 MG tablet Take 1 tablet (3 mg total) by mouth at bedtime. 02/15/23   Raulkar, Drema Pry, MD  simvastatin (ZOCOR) 40 MG tablet TAKE 1 TABLET BY MOUTH EVERY NIGHT AT BEDTIME 02/01/23   Pincus Sanes, MD  spironolactone (ALDACTONE) 25 MG tablet TAKE ONE TABLET BY MOUTH DAILY 06/22/22   Croitoru, Rachelle Hora, MD      Allergies    Patient has no known allergies.    Review of Systems   Review of  Systems  Physical Exam Updated Vital Signs BP (!) 130/55   Pulse 77   Temp 98.2 F (36.8 C) (Oral)   Resp 18   SpO2 100%  Physical Exam Vitals and nursing note reviewed.  Constitutional:      General: She is not in acute distress.    Appearance: She is well-developed.  HENT:     Head: Normocephalic and atraumatic.  Eyes:     Pupils: Pupils are equal, round, and reactive to light.  Cardiovascular:     Rate and Rhythm: Normal rate and regular rhythm.     Heart sounds: Murmur heard.     No friction rub.  Pulmonary:     Effort: Pulmonary effort is normal.     Breath sounds: Normal breath sounds. No wheezing or rales.  Abdominal:     General: Bowel sounds are normal. There is no distension.     Palpations: Abdomen is soft.     Tenderness: There is no abdominal tenderness. There is no guarding or rebound.  Musculoskeletal:        General: No tenderness. Normal range of motion.     Right lower leg: Edema present.     Left lower leg: Edema present.     Comments: 2+ pitting edema in bilateral lower extremities to the mid shin  Skin:    General: Skin is warm and dry.     Findings: No rash.  Neurological:     Mental Status: She is alert and oriented to person, place, and time. Mental status is at baseline.     Cranial Nerves: No cranial nerve deficit.  Psychiatric:        Behavior: Behavior normal.     ED Results / Procedures / Treatments   Labs (all labs ordered are listed, but only abnormal results are displayed) Labs Reviewed  BASIC METABOLIC PANEL - Abnormal; Notable for the following components:      Result Value   Glucose, Bld 164 (*)    Creatinine, Ser 1.03 (*)    GFR, Estimated 55 (*)    All other components within normal limits  URINALYSIS, W/ REFLEX TO CULTURE (INFECTION SUSPECTED) - Abnormal; Notable for the following components:   Nitrite POSITIVE (*)    Leukocytes,Ua SMALL (*)    Bacteria, UA RARE (*)    All other components within normal limits  CBG  MONITORING, ED - Abnormal; Notable for the following components:   Glucose-Capillary 148 (*)    All other components within normal limits  RESP PANEL BY RT-PCR (RSV, FLU A&B, COVID)  [id number]  URINE CULTURE  CBC WITH DIFFERENTIAL/PLATELET    EKG None  Radiology No results found.  Procedures Procedures    Medications Ordered in ED Medications  nitrofurantoin (macrocrystal-monohydrate) (MACROBID) capsule 100 mg (has no  administration in time range)    ED Course/ Medical Decision Making/ A&P                                 Medical Decision Making Amount and/or Complexity of Data Reviewed Labs: ordered. Decision-making details documented in ED Course.   Pt with multiple medical problems and comorbidities and presenting today with a complaint that caries a high risk for morbidity and mortality.  Patient here today with just feeling unwell and having high blood sugar and blood pressure at home.  She did not take any additional medicine however here blood sugar is improved it is 160 and 148.  Also recheck of blood pressure here the last 1 is 133/85.  Patient is on multiple blood pressure medications and it is noted she states she is only taking her hydralazine once a day but has been for some time even though it supposed to be 3 times a day.  She reports her blood pressure is usually 130s over 80s.  Also her blood sugar is close to baseline at this time.  I independently interpreted patient's labs and CBC is within normal limits, BMP without acute findings other than blood sugar of 164, viral panel is negative.  Patient did not have symptoms concerning for stroke today and is well-appearing at this time.  She reports she is starting to feel much better.  Will check a UA to ensure no developing infection.  8:19 PM UA with findings concerning for UTI.  Looks similar to UA in May when she grew out E. coli which was pansensitive except to Cipro.  Will start patient on Macrobid.  Findings  discussed with her.  She is comfortable with this plan.  Well-appearing and appears stable for discharge home.  She was given return precautions.        Final Clinical Impression(s) / ED Diagnoses Final diagnoses:  Urinary tract infection with hematuria, site unspecified    Rx / DC Orders ED Discharge Orders          Ordered    nitrofurantoin, macrocrystal-monohydrate, (MACROBID) 100 MG capsule  2 times daily        03/21/23 2018              Gwyneth Sprout, MD 03/21/23 2019

## 2023-03-21 NOTE — ED Notes (Signed)
Pt was given water and is aware urine is needed to be collected.

## 2023-03-21 NOTE — ED Notes (Signed)
Discharge instructions reviewed with patient. Patient questions answered and opportunity for education reviewed. Patient voices understanding of discharge instructions with no further questions. Patient ambulatory with steady gait to lobby.  

## 2023-03-21 NOTE — ED Triage Notes (Signed)
"  Havent felt well all day" Reports no energy . Symptoms started today. High glucose at home 265 and BP 188/66.

## 2023-03-21 NOTE — Discharge Instructions (Signed)
All the blood work looks normal today.  Your blood pressure and sugar have improved on their own.  However your urine does look suspicious for an early infection.  In May when you had similar findings you did have a UTI which was E. coli at the time.  You received 1 dose of antibiotics here so you can start the next dose tomorrow.  It was faxed to your pharmacy.  Culture results will come back in 2 days and if they are negative then you can stop the antibiotic.  Continue taking your current doses of medication.  Follow-up with your doctor next week for recheck if you are not feeling better.  Return to the emergency room if you start having confusion, fever, nausea vomiting or other concerns.

## 2023-03-22 ENCOUNTER — Other Ambulatory Visit: Payer: Self-pay | Admitting: Internal Medicine

## 2023-03-22 ENCOUNTER — Encounter: Payer: Medicare PPO | Attending: Physical Medicine and Rehabilitation | Admitting: Physical Medicine and Rehabilitation

## 2023-03-22 ENCOUNTER — Encounter: Payer: Self-pay | Admitting: Physical Medicine and Rehabilitation

## 2023-03-22 VITALS — BP 130/78 | HR 91 | Ht 68.0 in | Wt 213.4 lb

## 2023-03-22 DIAGNOSIS — G629 Polyneuropathy, unspecified: Secondary | ICD-10-CM

## 2023-03-22 DIAGNOSIS — M545 Low back pain, unspecified: Secondary | ICD-10-CM | POA: Diagnosis not present

## 2023-03-22 MED ORDER — HYDROCODONE-ACETAMINOPHEN 5-325 MG PO TABS: 1.0000 | ORAL_TABLET | Freq: Every day | ORAL | 0 refills | Status: DC

## 2023-03-22 MED ORDER — CAPSAICIN-CLEANSING GEL 8 % EX KIT
4.0000 | PACK | Freq: Once | CUTANEOUS | Status: AC
  Administered 2023-03-22: 4 via TOPICAL

## 2023-03-22 MED ORDER — LIDOCAINE 5 % EX PTCH: 1.0000 | MEDICATED_PATCH | CUTANEOUS | 0 refills | Status: DC

## 2023-03-22 NOTE — Progress Notes (Signed)
-  Discussed Qutenza as an option for neuropathic pain control. Discussed that this is a capsaicin patch, stronger than capsaicin cream. Discussed that it is currently approved for diabetic peripheral neuropathy and post-herpetic neuralgia, but that it has also shown benefit in treating other forms of neuropathy. Provided patient with link to site to learn more about the patch: https://www.clark.biz/. Discussed that the patch would be placed in office and benefits usually last 3 months. Discussed that unintended exposure to capsaicin can cause severe irritation of eyes, mucous membranes, respiratory tract, and skin, but that Qutenza is a local treatment and does not have the systemic side effects of other nerve medications. Discussed that there may be pain, itching, erythema, and decreased sensory function associated with the application of Qutenza. Side effects usually subside within 1 week. A cold pack of analgesic medications can help with these side effects. Blood pressure can also be increased due to pain associated with administration of the patch.   4 patches of Qutenza [id number]) was applied to the area of pain. Ice packs were applied during the procedure to ensure patient comfort. Blood pressure was monitored every 15 minutes. The patient tolerated the procedure well. Post-procedure instructions were given and follow-up has been scheduled.  Topical system measures 14cm x20cm (280cm for a total 1120units) were applied which will cause deeper penetration for destruction of the peripheral nerve using a chemical (Qutenza) which infuses into the skin like an injection and heat technique (occlusive, compressive dressing cauing endothermic heat technique)

## 2023-03-25 LAB — URINE CULTURE: Culture: >=100000 — AB

## 2023-03-26 ENCOUNTER — Telehealth (HOSPITAL_BASED_OUTPATIENT_CLINIC_OR_DEPARTMENT_OTHER): Payer: Self-pay | Admitting: *Deleted

## 2023-03-26 DIAGNOSIS — E1142 Type 2 diabetes mellitus with diabetic polyneuropathy: Secondary | ICD-10-CM | POA: Diagnosis not present

## 2023-03-26 DIAGNOSIS — G894 Chronic pain syndrome: Secondary | ICD-10-CM | POA: Diagnosis not present

## 2023-03-26 DIAGNOSIS — G2581 Restless legs syndrome: Secondary | ICD-10-CM | POA: Diagnosis not present

## 2023-03-26 NOTE — Telephone Encounter (Signed)
Post ED Visit - Positive Culture Follow-up  Culture report reviewed by antimicrobial stewardship pharmacist: [facility] [x]  Lennie Muckle Pharm.D. []  Celedonio Miyamoto, Pharm.D., BCPS AQ-ID []  Garvin Fila, Pharm.D., BCPS []  Georgina Pillion, Pharm.D., BCPS []  [place], [address].D., BCPS, AAHIVP []  Estella Husk, Pharm.D., BCPS, AAHIVP []  Lysle Pearl, PharmD, BCPS []  Phillips Climes, PharmD, BCPS []  Agapito Games, PharmD, BCPS []  Verlan Friends, PharmD []  Mervyn Gay, PharmD, BCPS []  Vinnie Level, PharmD  [facility] []  Len Childs, PharmD []  Greer Pickerel, PharmD []  Adalberto Cole, PharmD []  Perlie Gold, Rph []  Lonell Face) Jean Rosenthal, PharmD []  Earl Many, PharmD []  Junita Push, PharmD []  Dorna Leitz, PharmD []  Terrilee Files, PharmD []  Lynann Beaver, PharmD []  Keturah Barre, PharmD []  Loralee Pacas, PharmD []  Bernadene Person, PharmD   Positive urine culture Treated with Nitrofurantoin, organism sensitive to the same and no further patient follow-up is required at this time.  Nena Polio Garner Nash 03/26/2023, 9:04 AM

## 2023-04-06 ENCOUNTER — Telehealth: Payer: Self-pay

## 2023-04-06 NOTE — Telephone Encounter (Signed)
Transition Care Management Follow-up Telephone Call Date of discharge and from where: 03/21/2023 [facility] How have you been since you were released from the hospital? Patient declined to participate.  Erin Roach [facility]  [facility], [facility] Guide Direct Dial: [phone]  Website: [email or web address]

## 2023-04-13 ENCOUNTER — Other Ambulatory Visit: Payer: Self-pay

## 2023-04-13 DIAGNOSIS — J3089 Other allergic rhinitis: Secondary | ICD-10-CM

## 2023-04-13 MED ORDER — FLUTICASONE PROPIONATE 50 MCG/ACT NA SUSP: 1.0000 | Freq: Every day | NASAL | 0 refills | Status: DC

## 2023-04-14 ENCOUNTER — Other Ambulatory Visit: Payer: Self-pay

## 2023-04-23 ENCOUNTER — Encounter: Payer: Self-pay | Admitting: Internal Medicine

## 2023-04-25 DIAGNOSIS — G894 Chronic pain syndrome: Secondary | ICD-10-CM | POA: Diagnosis not present

## 2023-04-25 DIAGNOSIS — E1142 Type 2 diabetes mellitus with diabetic polyneuropathy: Secondary | ICD-10-CM | POA: Diagnosis not present

## 2023-04-25 DIAGNOSIS — G2581 Restless legs syndrome: Secondary | ICD-10-CM | POA: Diagnosis not present

## 2023-04-27 ENCOUNTER — Encounter: Payer: Self-pay | Admitting: Physical Medicine and Rehabilitation

## 2023-04-27 NOTE — Progress Notes (Unsigned)
Subjective:    Patient ID: Erin Roach, female    DOB: 18-Mar-1941, 82 y.o.   MRN: [id number]     HPI Erin Roach is here for follow up of her chronic medical problems.  Not sleeping well.  She was on Lunesta at 1 point that really did not work all that well and has not been taking it recently-ran out.  She is desperate for sleep and wondering if she can try something.  Needs a refill of her water pill -Some nights worse than others.  Overall she thinks there has been some improvement.  Sees the specialist at [facility] early next year.    Medications and allergies reviewed with patient and updated if appropriate.  Current Outpatient Medications on File Prior to Visit  Medication Sig Dispense Refill   albuterol (VENTOLIN HFA) 108 (90 Base) MCG/ACT inhaler Inhale 2 puffs into the lungs every 6 (six) hours as needed for wheezing or shortness of breath. 8 g 0   aspirin EC 81 MG tablet Take 1 tablet (81 mg total) by mouth daily. Swallow whole. 30 tablet 12   cholecalciferol (VITAMIN D3) 25 MCG (1000 UNIT) tablet Take 1,000 Units by mouth daily.     co-enzyme Q-10 30 MG capsule Take 30 mg by mouth daily.     ezetimibe (ZETIA) 10 MG tablet TAKE 1 TABLET BY MOUTH DAILY 90 tablet 3   fluticasone (FLONASE) 50 MCG/ACT nasal spray Place 1 spray into both nostrils daily. 16 g 0   furosemide (LASIX) 20 MG tablet TAKE 1 TABLET BY MOUTH DAILY 30 tablet 0   HORIZANT 600 MG TBCR TAKE 1 TABLET BY MOUTH AT BEDTIME 90 tablet 0   hydrALAZINE (APRESOLINE) 50 MG tablet TAKE ONE TABLET BY MOUTH THREE TIMES A DAY 270 tablet 2   HYDROcodone-acetaminophen (NORCO/VICODIN) 5-325 MG tablet Take 1-2 tablets by mouth at bedtime. 60 tablet 0   lidocaine (LIDODERM) 5 % Place 1 patch onto the skin daily. Remove & Discard patch within 12 hours or as directed by MD 30 patch 0   losartan (COZAAR) 50 MG tablet TAKE 1 TABLET BY MOUTH DAILY 90 tablet 1   magnesium gluconate (MAGONATE) 500 MG tablet Take 500 mg by mouth 2  (two) times daily.     OZEMPIC, 2 MG/DOSE, 8 MG/3ML SOPN DIAL AND INJECT UNDER THE SKIN 2 MG WEEKLY 3 mL 5   pregabalin (LYRICA) 75 MG capsule TAKE 1 CAPSULE BY MOUTH EVERY AFTERNOON AND TAKE 2 CAPSULES BY MOUTH EVERY NIGHT AT BEDTIME 270 capsule 3   rOPINIRole (REQUIP) 3 MG tablet Take 1 tablet (3 mg total) by mouth at bedtime. 90 tablet 3   simvastatin (ZOCOR) 40 MG tablet TAKE 1 TABLET BY MOUTH EVERY NIGHT AT BEDTIME 90 tablet 1   spironolactone (ALDACTONE) 25 MG tablet TAKE ONE TABLET BY MOUTH DAILY 90 tablet 3   No current facility-administered medications on file prior to visit.     Review of Systems  Constitutional:  Positive for fatigue. Negative for fever.  Respiratory:  Negative for cough, shortness of breath and wheezing.   Cardiovascular:  Positive for leg swelling. Negative for chest pain and palpitations.  Neurological:  Positive for light-headedness. Negative for headaches.       Poor balance  Psychiatric/Behavioral:  Positive for sleep disturbance.        Objective:   Vitals:   04/28/23 1440  BP: 132/70  Pulse: 85  Temp: 98.1 F (36.7 C)  SpO2: 98%  BP Readings from Last 3 Encounters:  04/28/23 132/70  03/22/23 130/78  03/21/23 129/61   Wt Readings from Last 3 Encounters:  04/28/23 214 lb (97.1 kg)  03/22/23 213 lb 6.4 oz (96.8 kg)  02/11/23 215 lb (97.5 kg)   Body mass index is 32.54 kg/m.    Physical Exam Constitutional:      General: She is not in acute distress.    Appearance: Normal appearance.  HENT:     Head: Normocephalic and atraumatic.  Eyes:     Conjunctiva/sclera: Conjunctivae normal.  Cardiovascular:     Rate and Rhythm: Normal rate and regular rhythm.     Heart sounds: Normal heart sounds.  Pulmonary:     Effort: Pulmonary effort is normal. No respiratory distress.     Breath sounds: Normal breath sounds. No wheezing.  Musculoskeletal:     Cervical back: Neck supple.     Right lower leg: Edema (Trace) present.     Left lower  leg: Edema (Trace) present.  Lymphadenopathy:     Cervical: No cervical adenopathy.  Skin:    General: Skin is warm and dry.     Findings: No rash.  Neurological:     Mental Status: She is alert. Mental status is at baseline.  Psychiatric:        Mood and Affect: Mood normal.        Behavior: Behavior normal.        Lab Results  Component Value Date   WBC 7.3 03/21/2023   HGB 13.0 03/21/2023   HCT 37.8 03/21/2023   PLT 215 03/21/2023   GLUCOSE 164 (H) 03/21/2023   CHOL 191 05/18/2022   TRIG 231.0 (H) 05/18/2022   HDL 53.10 05/18/2022   LDLDIRECT 110.0 05/18/2022   LDLCALC 55 09/29/2021   ALT 21 09/26/2022   AST 15 09/26/2022   NA 137 03/21/2023   K 4.7 03/21/2023   CL 106 03/21/2023   CREATININE 1.03 (H) 03/21/2023   BUN 22 03/21/2023   CO2 23 03/21/2023   TSH 2.281 09/26/2022   INR 1.03 03/27/2015   HGBA1C 6.3 05/18/2022   MICROALBUR <0.7 12/05/2021     Assessment & Plan:    See Problem List for Assessment and Plan of chronic medical problems.

## 2023-04-27 NOTE — Patient Instructions (Addendum)
      Blood work was ordered.       Medications changes include :   None    A referral was ordered and someone will call you to schedule an appointment.     Return in about 6 months (around 10/27/2023) for Physical Exam.

## 2023-04-28 ENCOUNTER — Ambulatory Visit: Payer: Medicare PPO | Admitting: Internal Medicine

## 2023-04-28 ENCOUNTER — Encounter: Payer: Self-pay | Admitting: Internal Medicine

## 2023-04-28 ENCOUNTER — Other Ambulatory Visit: Payer: Self-pay | Admitting: Internal Medicine

## 2023-04-28 ENCOUNTER — Other Ambulatory Visit: Payer: Self-pay | Admitting: Physical Medicine and Rehabilitation

## 2023-04-28 VITALS — BP 132/70 | HR 85 | Temp 98.1°F | Ht 68.0 in | Wt 214.0 lb

## 2023-04-28 DIAGNOSIS — E1129 Type 2 diabetes mellitus with other diabetic kidney complication: Secondary | ICD-10-CM

## 2023-04-28 DIAGNOSIS — G4709 Other insomnia: Secondary | ICD-10-CM | POA: Diagnosis not present

## 2023-04-28 DIAGNOSIS — M545 Low back pain, unspecified: Secondary | ICD-10-CM

## 2023-04-28 DIAGNOSIS — E7849 Other hyperlipidemia: Secondary | ICD-10-CM | POA: Diagnosis not present

## 2023-04-28 DIAGNOSIS — R7989 Other specified abnormal findings of blood chemistry: Secondary | ICD-10-CM

## 2023-04-28 DIAGNOSIS — G2581 Restless legs syndrome: Secondary | ICD-10-CM

## 2023-04-28 DIAGNOSIS — R809 Proteinuria, unspecified: Secondary | ICD-10-CM | POA: Diagnosis not present

## 2023-04-28 DIAGNOSIS — E559 Vitamin D deficiency, unspecified: Secondary | ICD-10-CM

## 2023-04-28 DIAGNOSIS — I10 Essential (primary) hypertension: Secondary | ICD-10-CM | POA: Diagnosis not present

## 2023-04-28 MED ORDER — FUROSEMIDE 20 MG PO TABS: 40.0000 mg | ORAL_TABLET | Freq: Every day | ORAL | 1 refills | Status: DC

## 2023-04-28 MED ORDER — HYDROCODONE-ACETAMINOPHEN 5-325 MG PO TABS: 1.0000 | ORAL_TABLET | Freq: Every day | ORAL | 0 refills | Status: DC

## 2023-04-28 MED ORDER — ZOLPIDEM TARTRATE 5 MG PO TABS: 5.0000 mg | ORAL_TABLET | Freq: Every evening | ORAL | 0 refills | Status: DC | PRN

## 2023-04-28 NOTE — Telephone Encounter (Signed)
Patient needs refill on Hydrocodone last fill per pmp   Filled  Written  ID  Drug  QTY  Days  Prescriber  RX #  Dispenser  Refill  Daily Dose*  Pymt Type  PMP  03/31/2023 03/22/2023 1  Hydrocodone-Acetamin 5-325 Mg 60.00 30 Kr Rau [id number] Har (1622) 0/0 10.00 MME Medicare [place]

## 2023-04-28 NOTE — Assessment & Plan Note (Signed)
B12 level on the low side Taking B12 regularly Will give B12 injection today

## 2023-04-28 NOTE — Assessment & Plan Note (Signed)
Chronic Check lipid panel, CMP Continue simvastatin 40 mg daily

## 2023-04-28 NOTE — Assessment & Plan Note (Signed)
Chronic   Lab Results  Component Value Date   HGBA1C 6.3 05/18/2022   Sugars well controlled Check A1c, urine microalbumin Continue Ozempic 1 mg weekly Stressed regular exercise, diabetic diet

## 2023-04-28 NOTE — Assessment & Plan Note (Signed)
Chronic Blood pressure well controlled CMP Continue hydralazine 50 mg 3 times daily, losartan 50 mg daily, spironolactone 25 mg daily

## 2023-04-28 NOTE — Assessment & Plan Note (Signed)
Check vitamin d level

## 2023-04-28 NOTE — Assessment & Plan Note (Addendum)
Falling asleep is ok but staying asleep is impossible. She has tried several medications in the past and they have not been effective She is desperate for sleep Discussed some options She is willing to try Ambien-discussed possible side effects Start Ambien 5 mg nightly

## 2023-04-28 NOTE — Assessment & Plan Note (Signed)
Chronic Currently following with [facility] To see R[facility] specialist at [facility] Continue heart is not 600 mg bedtime Continue hydrocodone-acetaminophen 5-325 mg 1-2 tablets at bedtime Continue lidocaine patch 5% Continue Lyrica 75 mg in the afternoon and 150 mg at bedtime Continue Requip 3 mg at bedtime

## 2023-05-03 ENCOUNTER — Encounter: Payer: Medicare PPO | Attending: Physical Medicine and Rehabilitation | Admitting: Physical Medicine and Rehabilitation

## 2023-05-03 DIAGNOSIS — M545 Low back pain, unspecified: Secondary | ICD-10-CM | POA: Insufficient documentation

## 2023-05-03 DIAGNOSIS — G629 Polyneuropathy, unspecified: Secondary | ICD-10-CM | POA: Insufficient documentation

## 2023-05-06 ENCOUNTER — Encounter: Payer: Self-pay | Admitting: Physical Medicine and Rehabilitation

## 2023-05-07 ENCOUNTER — Other Ambulatory Visit: Payer: Self-pay | Admitting: Physical Medicine and Rehabilitation

## 2023-05-07 DIAGNOSIS — M545 Low back pain, unspecified: Secondary | ICD-10-CM

## 2023-05-07 MED ORDER — HYDROCODONE-ACETAMINOPHEN 5-325 MG PO TABS: 1.0000 | ORAL_TABLET | Freq: Every day | ORAL | 0 refills | Status: DC

## 2023-05-16 ENCOUNTER — Other Ambulatory Visit: Payer: Self-pay | Admitting: Internal Medicine

## 2023-05-17 ENCOUNTER — Ambulatory Visit: Payer: Medicare PPO | Admitting: Physical Medicine and Rehabilitation

## 2023-05-20 DIAGNOSIS — L84 Corns and callosities: Secondary | ICD-10-CM | POA: Diagnosis not present

## 2023-05-20 DIAGNOSIS — L603 Nail dystrophy: Secondary | ICD-10-CM | POA: Diagnosis not present

## 2023-05-20 DIAGNOSIS — B351 Tinea unguium: Secondary | ICD-10-CM | POA: Diagnosis not present

## 2023-05-20 DIAGNOSIS — I739 Peripheral vascular disease, unspecified: Secondary | ICD-10-CM | POA: Diagnosis not present

## 2023-05-26 DIAGNOSIS — G894 Chronic pain syndrome: Secondary | ICD-10-CM | POA: Diagnosis not present

## 2023-05-26 DIAGNOSIS — G2581 Restless legs syndrome: Secondary | ICD-10-CM | POA: Diagnosis not present

## 2023-05-26 DIAGNOSIS — E1142 Type 2 diabetes mellitus with diabetic polyneuropathy: Secondary | ICD-10-CM | POA: Diagnosis not present

## 2023-06-02 ENCOUNTER — Other Ambulatory Visit: Payer: Self-pay | Admitting: Family Medicine

## 2023-06-02 DIAGNOSIS — J3089 Other allergic rhinitis: Secondary | ICD-10-CM

## 2023-06-07 DIAGNOSIS — F1190 Opioid use, unspecified, uncomplicated: Secondary | ICD-10-CM | POA: Diagnosis not present

## 2023-06-07 DIAGNOSIS — G2581 Restless legs syndrome: Secondary | ICD-10-CM | POA: Diagnosis not present

## 2023-06-07 DIAGNOSIS — E611 Iron deficiency: Secondary | ICD-10-CM | POA: Diagnosis not present

## 2023-06-08 ENCOUNTER — Encounter: Payer: Self-pay | Admitting: Internal Medicine

## 2023-06-08 DIAGNOSIS — G2581 Restless legs syndrome: Secondary | ICD-10-CM

## 2023-06-14 ENCOUNTER — Other Ambulatory Visit (INDEPENDENT_AMBULATORY_CARE_PROVIDER_SITE_OTHER): Payer: Medicare PPO

## 2023-06-14 DIAGNOSIS — G2581 Restless legs syndrome: Secondary | ICD-10-CM

## 2023-06-14 LAB — FERRITIN: Ferritin: 543.8 ng/mL — ABNORMAL HIGH (ref 10.0–291.0)

## 2023-06-14 LAB — IBC PANEL
Iron: 117 ug/dL (ref 42–145)
Saturation Ratios: 38.9 % (ref 20.0–50.0)
TIBC: 301 ug/dL (ref 250.0–450.0)
Transferrin: 215 mg/dL (ref 212.0–360.0)

## 2023-06-15 ENCOUNTER — Encounter: Payer: Self-pay | Admitting: Internal Medicine

## 2023-06-15 ENCOUNTER — Other Ambulatory Visit: Payer: Self-pay | Admitting: Cardiovascular Disease

## 2023-06-15 ENCOUNTER — Other Ambulatory Visit: Payer: Self-pay | Admitting: Internal Medicine

## 2023-06-16 ENCOUNTER — Other Ambulatory Visit: Payer: Self-pay | Admitting: Internal Medicine

## 2023-06-22 ENCOUNTER — Encounter: Payer: Medicare PPO | Admitting: Physical Medicine and Rehabilitation

## 2023-06-26 DIAGNOSIS — G894 Chronic pain syndrome: Secondary | ICD-10-CM | POA: Diagnosis not present

## 2023-06-26 DIAGNOSIS — E1142 Type 2 diabetes mellitus with diabetic polyneuropathy: Secondary | ICD-10-CM | POA: Diagnosis not present

## 2023-06-26 DIAGNOSIS — G2581 Restless legs syndrome: Secondary | ICD-10-CM | POA: Diagnosis not present

## 2023-07-07 ENCOUNTER — Other Ambulatory Visit: Payer: Self-pay

## 2023-07-07 MED ORDER — SPIRONOLACTONE 25 MG PO TABS: 25.0000 mg | ORAL_TABLET | Freq: Every day | ORAL | 0 refills | Status: DC

## 2023-07-08 DIAGNOSIS — G4733 Obstructive sleep apnea (adult) (pediatric): Secondary | ICD-10-CM | POA: Diagnosis not present

## 2023-07-11 ENCOUNTER — Other Ambulatory Visit: Payer: Self-pay | Admitting: Physical Medicine and Rehabilitation

## 2023-07-11 ENCOUNTER — Other Ambulatory Visit: Payer: Self-pay | Admitting: Internal Medicine

## 2023-07-13 NOTE — Telephone Encounter (Signed)
 Ambien last refill: 04/28/23, 30 tablet 0 refill

## 2023-07-16 ENCOUNTER — Encounter: Payer: Self-pay | Admitting: Internal Medicine

## 2023-07-18 MED ORDER — HORIZANT 600 MG PO TBCR: 1.0000 | EXTENDED_RELEASE_TABLET | Freq: Every day | ORAL | 0 refills | Status: DC

## 2023-07-23 ENCOUNTER — Encounter: Attending: Physical Medicine and Rehabilitation | Admitting: Physical Medicine and Rehabilitation

## 2023-07-23 ENCOUNTER — Encounter: Payer: Self-pay | Admitting: Physical Medicine and Rehabilitation

## 2023-07-23 VITALS — BP 101/67 | HR 94 | Ht <= 58 in | Wt 221.0 lb

## 2023-07-23 DIAGNOSIS — G629 Polyneuropathy, unspecified: Secondary | ICD-10-CM | POA: Diagnosis not present

## 2023-07-23 MED ORDER — DULOXETINE HCL 20 MG PO CPEP: 20.0000 mg | ORAL_CAPSULE | Freq: Every day | ORAL | 3 refills | Status: AC

## 2023-07-23 MED ORDER — HYDROCODONE-ACETAMINOPHEN 5-325 MG PO TABS: 1.0000 | ORAL_TABLET | Freq: Two times a day (BID) | ORAL | 0 refills | Status: DC | PRN

## 2023-07-23 MED ORDER — CAPSAICIN-CLEANSING GEL 8 % EX KIT
4.0000 | PACK | Freq: Once | CUTANEOUS | Status: AC
  Administered 2023-07-23: 4 via TOPICAL

## 2023-07-23 MED ORDER — ONDANSETRON HCL 4 MG PO TABS: 4.0000 mg | ORAL_TABLET | Freq: Three times a day (TID) | ORAL | 0 refills | Status: DC | PRN

## 2023-07-23 NOTE — Progress Notes (Signed)
-  Discussed Qutenza as an option for neuropathic pain control. Discussed that this is a capsaicin patch, stronger than capsaicin cream. Discussed that it is currently approved for diabetic peripheral neuropathy and post-herpetic neuralgia, but that it has also shown benefit in treating other forms of neuropathy. Provided patient with link to site to learn more about the patch: https://www.clark.biz/. Discussed that the patch would be placed in office and benefits usually last 3 months. Discussed that unintended exposure to capsaicin can cause severe irritation of eyes, mucous membranes, respiratory tract, and skin, but that Qutenza is a local treatment and does not have the systemic side effects of other nerve medications. Discussed that there may be pain, itching, erythema, and decreased sensory function associated with the application of Qutenza. Side effects usually subside within 1 week. A cold pack of analgesic medications can help with these side effects. Blood pressure can also be increased due to pain associated with administration of the patch.   4 patches of Qutenza [phone]) was applied bilateral feet. Ice packs were applied during the procedure to ensure patient comfort. Blood pressure was monitored every 15 minutes. The patient tolerated the procedure well. Post-procedure instructions were given and follow-up has been scheduled.  Topical system measures 14cm x20cm (280cm for a total 1120units) were applied which will cause deeper penetration for destruction of the peripheral nerve using a chemical (Qutenza) which infuses into the skin like an injection and heat technique (occlusive, compressive dressing cauing endothermic heat technique)

## 2023-07-24 ENCOUNTER — Encounter: Payer: Self-pay | Admitting: Physical Medicine and Rehabilitation

## 2023-07-26 ENCOUNTER — Encounter: Payer: Self-pay | Admitting: Internal Medicine

## 2023-07-26 DIAGNOSIS — E1142 Type 2 diabetes mellitus with diabetic polyneuropathy: Secondary | ICD-10-CM | POA: Diagnosis not present

## 2023-07-26 DIAGNOSIS — G2581 Restless legs syndrome: Secondary | ICD-10-CM | POA: Diagnosis not present

## 2023-07-26 DIAGNOSIS — G894 Chronic pain syndrome: Secondary | ICD-10-CM | POA: Diagnosis not present

## 2023-07-27 ENCOUNTER — Other Ambulatory Visit (HOSPITAL_COMMUNITY): Payer: Self-pay

## 2023-07-27 ENCOUNTER — Telehealth: Payer: Self-pay

## 2023-07-27 ENCOUNTER — Other Ambulatory Visit (HOSPITAL_BASED_OUTPATIENT_CLINIC_OR_DEPARTMENT_OTHER): Payer: Self-pay

## 2023-07-27 NOTE — Telephone Encounter (Signed)
 Pharmacy Patient Advocate Encounter   Received notification from Pt Calls Messages that prior authorization for Horizant 600MG  er tablets is required/requested.   Insurance verification completed.   The patient is insured through [employer] .   Per test claim: PA required; PA submitted to above mentioned insurance via CoverMyMeds Key/confirmation #/EOC [id number] Status is pending

## 2023-07-27 NOTE — Telephone Encounter (Signed)
 Pharmacy Patient Advocate Encounter  Received notification from [facility] that Prior Authorization for Horizant 600MG  er tablets  has been APPROVED from 05/12/23 to 05/10/24. Unable to obtain price due to refill too soon rejection, last fill date 07/19/23 next available fill date04/04/25   PA #/Case ID/Reference #: [id number]

## 2023-07-27 NOTE — Telephone Encounter (Signed)
 PA request has been Submitted. New Encounter has been or will be created for follow up. For additional info see Pharmacy Prior Auth telephone encounter from 07/27/23.

## 2023-07-30 IMAGING — MR MR KNEE*R* W/O CM
6 series · 40 of 40 positions shown · non-contrast
Comparison: X-ray knee 12/12/2020; MR knee 10/13/2013.

CLINICAL DATA: Generalized knee pain due to a fall 4 months ago.
Asses meniscal/ligament injury.

EXAM:
MRI OF THE RIGHT KNEE WITHOUT CONTRAST
TECHNIQUE: Multiplanar, multisequence MR imaging of the knee was performed. No
intravenous contrast was administered.

[Series 6: T2 fat-sat · axial · right · 4.0mm · 0.50mm/px · z∈[-61,+92]mm · 9 of 36 slices shown (1 of 3)]
[im 1/36]
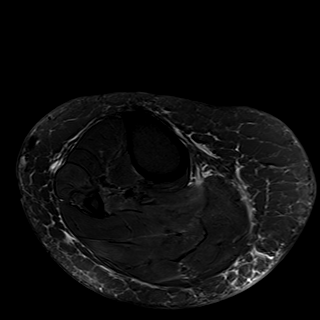
[im 5/36]
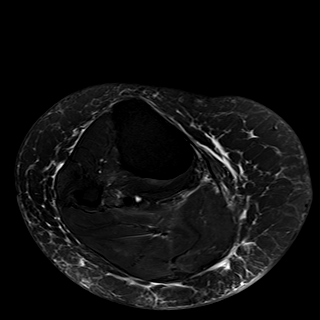
[im 9/36]
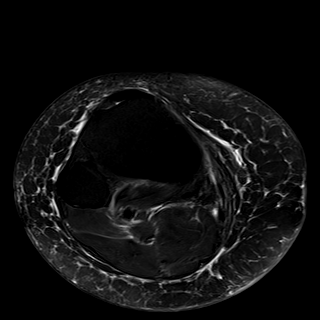
[im 14/36]
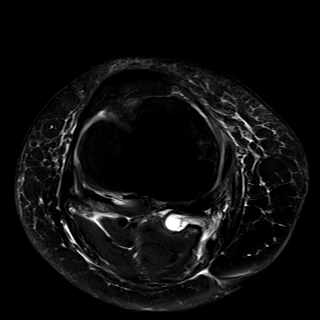
[im 18/36]
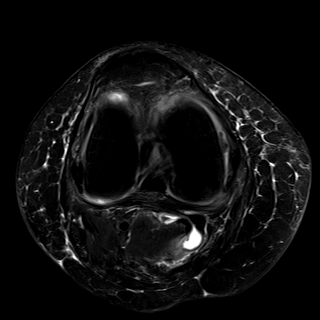
[im 22/36]
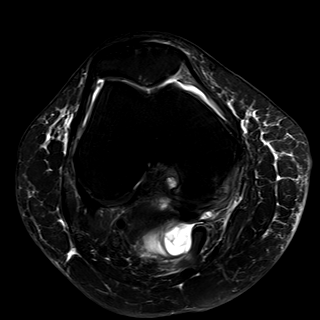
[im 27/36]
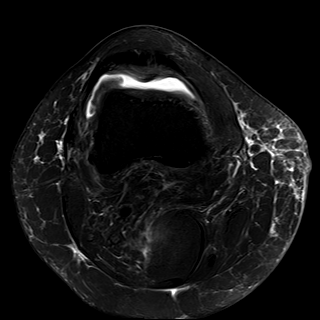
[im 31/36]
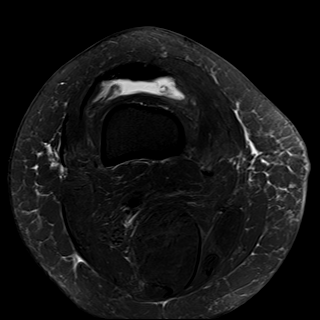
[im 36/36]
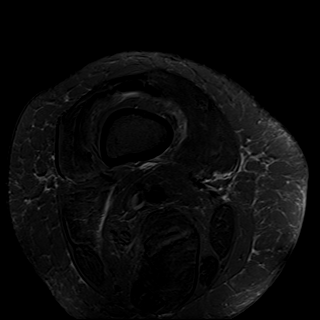

[Series 7: T2 fat-sat · coronal · right · 4.0mm · 0.47mm/px · 7 of 28 slices shown (2 of 3)]
[im 1/28]
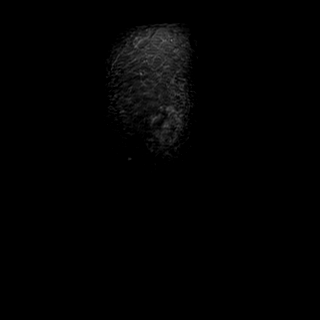
[im 5/28]
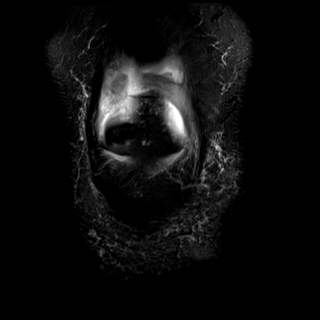
[im 10/28]
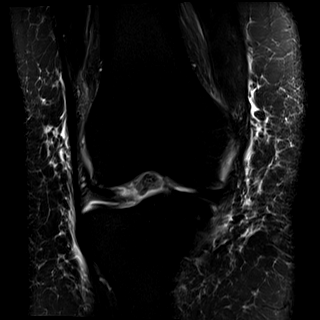
[im 14/28]
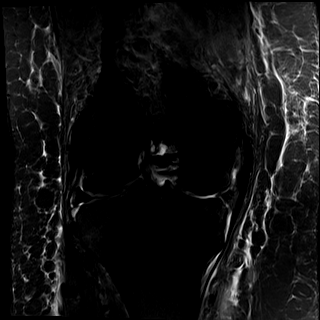
[im 19/28]
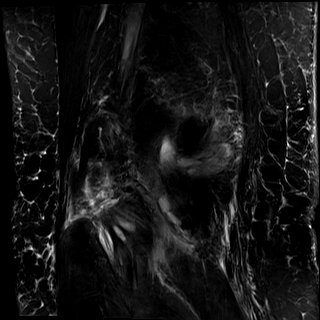
[im 23/28]
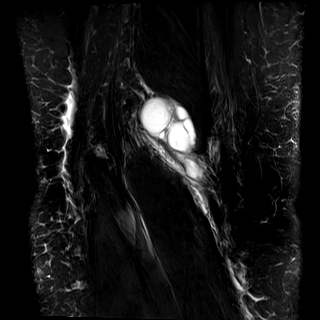
[im 28/28]
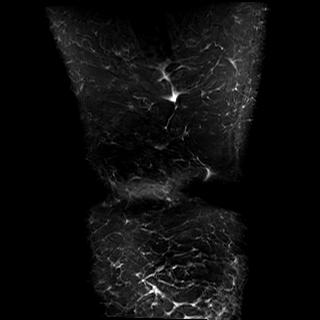

[Series 8: T2 fat-sat · sagittal · right · 3.0mm · 0.39mm/px · 6 of 27 slices shown (3 of 3)]
[im 1/27]
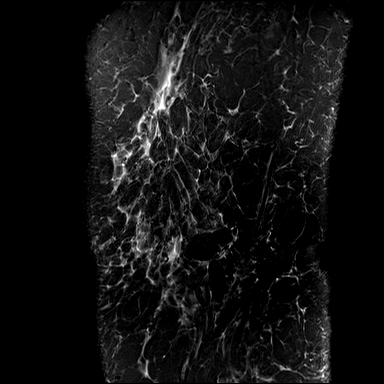
[im 6/27]
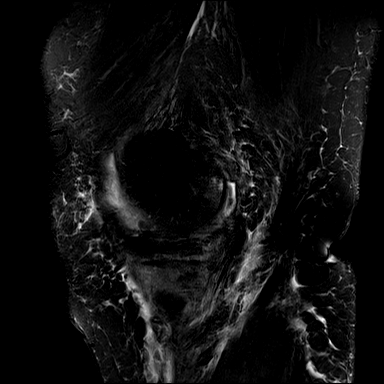
[im 11/27]
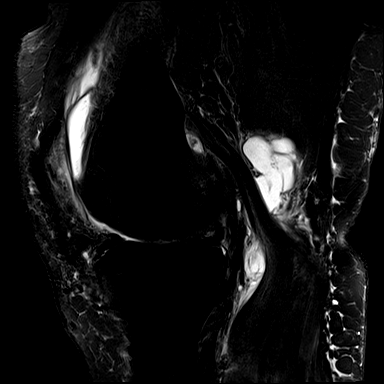
[im 16/27]
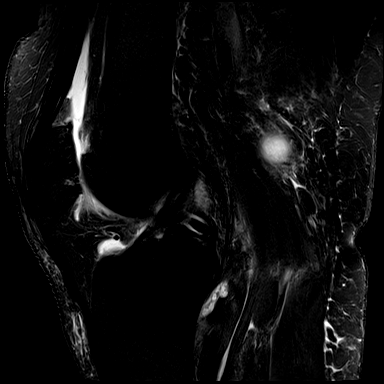
[im 21/27]
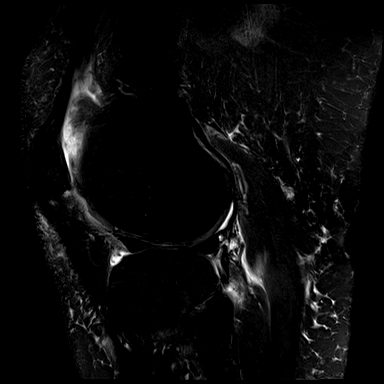
[im 27/27]
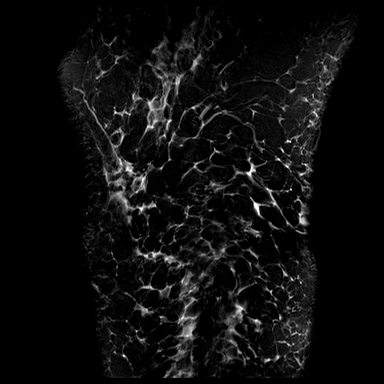

[Series 9: T1 · coronal · right · 4.0mm · 0.47mm/px · 6 of 28 slices shown]
[im 1/28]
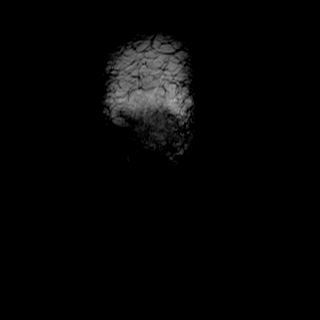
[im 6/28]
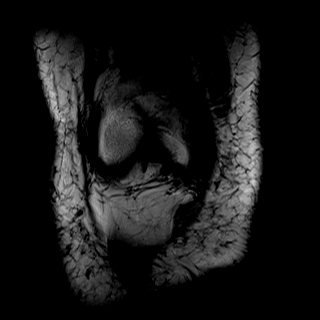
[im 11/28]
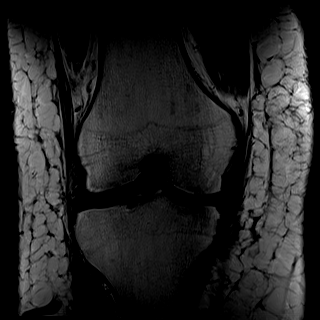
[im 17/28]
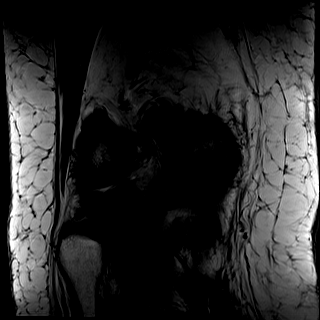
[im 22/28]
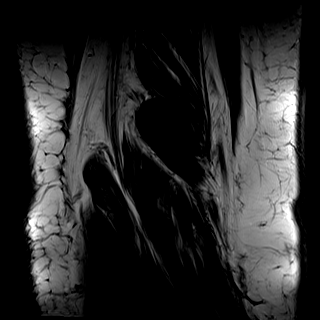
[im 28/28]
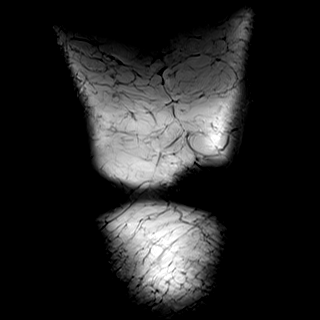

[Series 10: PD fat-sat · coronal · right · 3.0mm · 0.47mm/px · 6 of 28 slices shown (1 of 2)]
[im 1/28]
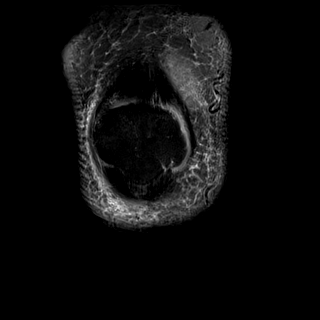
[im 6/28]
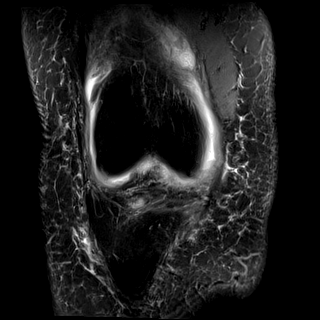
[im 11/28]
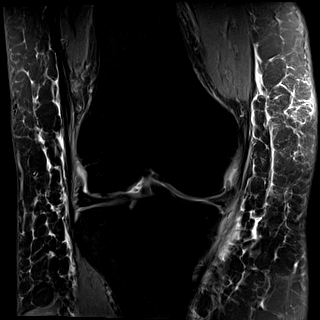
[im 17/28]
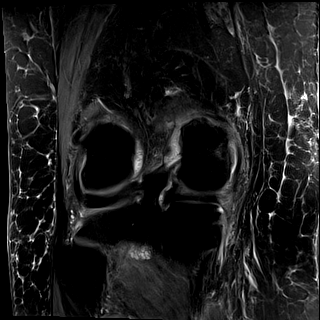
[im 22/28]
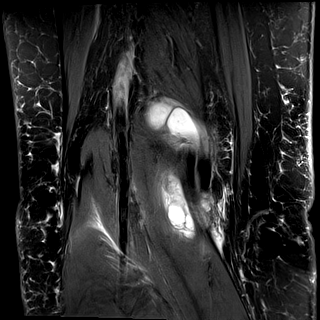
[im 28/28]
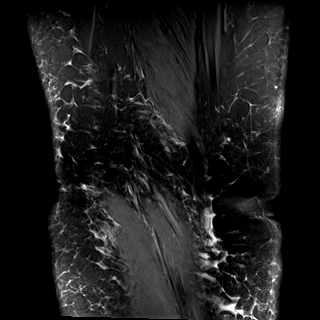

[Series 11: PD fat-sat · sagittal · right · 3.0mm · 0.39mm/px · 6 of 27 slices shown (2 of 2)]
[im 1/27]
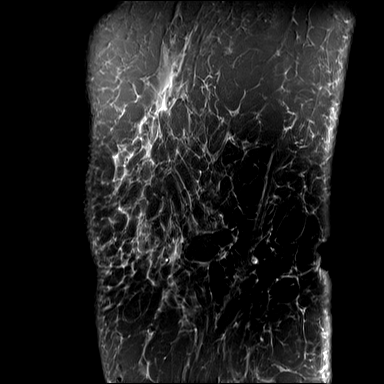
[im 6/27]
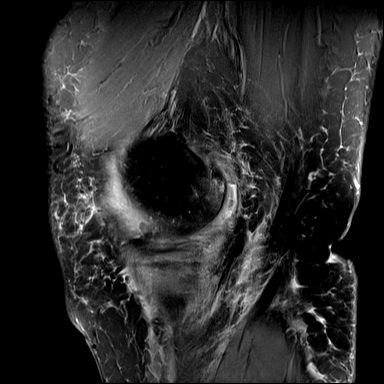
[im 11/27]
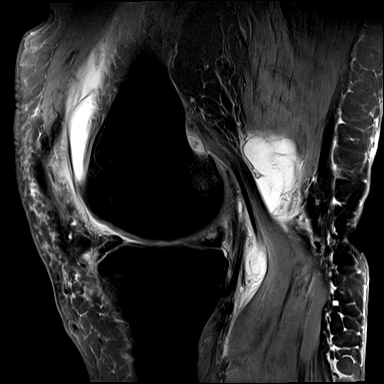
[im 16/27]
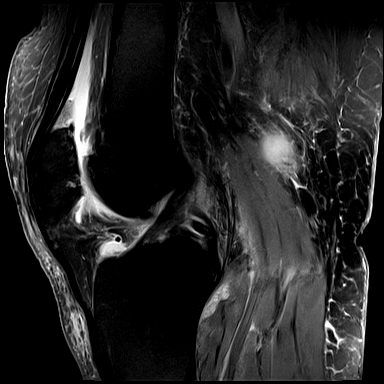
[im 21/27]
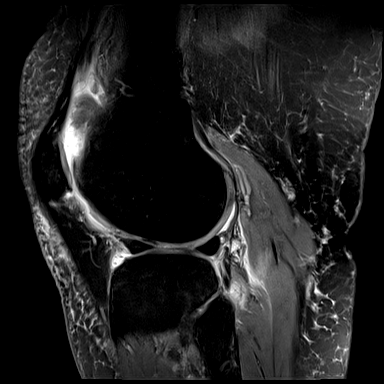
[im 27/27]
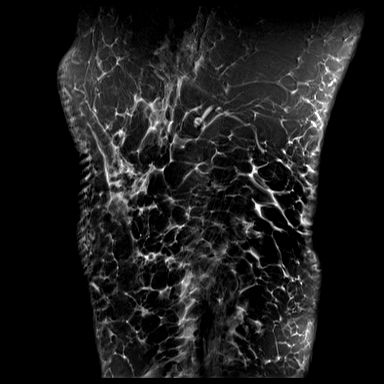

[40 of 40 positions shown; findings below may reference images not displayed]

FINDINGS: MENISCI

Medial: Complex tear of the posterior horn and body of the medial
meniscus with peripheral meniscal extrusion.

Lateral: Degeneration of the lateral meniscus. Small radial tear of
the free edge of the body of the lateral meniscus.

LIGAMENTS

Cruciates: ACL and PCL are intact.

Collaterals: Medial collateral ligament is intact. Lateral
collateral ligament complex is intact.

CARTILAGE

Patellofemoral: Partial-thickness cartilage loss of the
patellofemoral compartment with areas of full-thickness cartilage
loss of the patella.

Medial: High-grade partial-thickness cartilage loss of the medial
femorotibial compartment with areas of full-thickness cartilage loss
of the medial femoral condyle.

Lateral: Cartilage irregularity of the lateral femorotibial
compartment with areas of partial thickness cartilage loss.

JOINT: Moderate joint effusion. Normal Danny Alberto Mireya. No plical
thickening.

POPLITEAL FOSSA: Popliteus tendon is intact. Small Baker's cyst.

EXTENSOR MECHANISM: Intact quadriceps tendon. Intact patellar
tendon. Intact lateral patellar retinaculum. Intact medial patellar
retinaculum. Intact MPFL.

BONES: No aggressive osseous lesion. No fracture or dislocation.

Other: No fluid collection or hematoma. Muscles are normal.
IMPRESSION: 1. Complex tear of the posterior horn and body of the medial
meniscus with peripheral meniscal extrusion.
2. Degeneration of the lateral meniscus. Small radial tear of the
free edge of the body of the lateral meniscus.
3.  Tricompartmental cartilage abnormalities as described above.

## 2023-07-31 ENCOUNTER — Encounter: Payer: Self-pay | Admitting: Internal Medicine

## 2023-08-03 MED ORDER — SPIRONOLACTONE 25 MG PO TABS: 25.0000 mg | ORAL_TABLET | Freq: Every day | ORAL | 0 refills | Status: DC

## 2023-08-09 ENCOUNTER — Other Ambulatory Visit: Payer: Self-pay | Admitting: Internal Medicine

## 2023-08-12 ENCOUNTER — Encounter: Attending: Physical Medicine and Rehabilitation | Admitting: Physical Medicine & Rehabilitation

## 2023-08-12 DIAGNOSIS — G629 Polyneuropathy, unspecified: Secondary | ICD-10-CM | POA: Insufficient documentation

## 2023-08-12 DIAGNOSIS — G2581 Restless legs syndrome: Secondary | ICD-10-CM | POA: Insufficient documentation

## 2023-08-12 DIAGNOSIS — G4701 Insomnia due to medical condition: Secondary | ICD-10-CM | POA: Insufficient documentation

## 2023-08-16 ENCOUNTER — Encounter: Admitting: Physical Medicine and Rehabilitation

## 2023-08-16 VITALS — BP 96/53 | HR 99 | Ht 68.0 in | Wt 213.0 lb

## 2023-08-16 DIAGNOSIS — G2581 Restless legs syndrome: Secondary | ICD-10-CM

## 2023-08-16 DIAGNOSIS — G4701 Insomnia due to medical condition: Secondary | ICD-10-CM | POA: Diagnosis not present

## 2023-08-16 DIAGNOSIS — G629 Polyneuropathy, unspecified: Secondary | ICD-10-CM | POA: Diagnosis not present

## 2023-08-16 MED ORDER — HYDROCODONE-ACETAMINOPHEN 5-325 MG PO TABS: 1.0000 | ORAL_TABLET | Freq: Two times a day (BID) | ORAL | 0 refills | Status: DC | PRN

## 2023-08-16 MED ORDER — PREGABALIN 75 MG PO CAPS: ORAL_CAPSULE | ORAL | 3 refills | Status: AC

## 2023-08-16 MED ORDER — HYDRALAZINE HCL 25 MG PO TABS: 25.0000 mg | ORAL_TABLET | Freq: Three times a day (TID) | ORAL | 3 refills | Status: AC

## 2023-08-16 MED ORDER — AMITRIPTYLINE HCL 10 MG PO TABS: 10.0000 mg | ORAL_TABLET | Freq: Every day | ORAL | 1 refills | Status: DC

## 2023-08-16 NOTE — Progress Notes (Signed)
 Subjective:    Patient ID: Erin Roach, female    DOB: 04/03/1941, 83 y.o.   MRN: [id number]  HPI Mrs. Prest is an 83 year old woman who presents with restless leg syndrome and neuropathy  1) Restless leg syndrome: -still very severe, she would like to increase the dose of Requip at night -first noted when she was 32 and was pregnant with her first child -she she retired at age 42 her symptoms returned and it seems to be getting worse -had an iron infusion and this did not help -she has been started on Requip, she is taking 0.6mg  at night -was hurting for 22 hours two days a ago.  -she feels she needs something for pain -she is not sure that the Requip was helping -she would like dose of Requip increased  2) OSA: -Earnest Bailey is working well  3) Neuropathy: Pain has not been great -HgbA1c reviewed and is 6.3 -taking Lyrica, she has not noted benefit from restarting this -she has not noted any benefits from Qutenza yet  4) Insomnia:  -she thinks this is due to restless leg syndrome and neuropathy  5) Hypotension:  -BP is 96/63   Pain Inventory Average Pain 10 Pain Right Now 0 My pain is intermittent, sharp, and burning  In the last 24 hours, has pain interfered with the following? General activity 8 Relation with others 8 Enjoyment of life 9 What TIME of day is your pain at its worst? night Sleep (in general) Poor  Pain is worse with: sitting and inactivity Pain improves with: medication Relief from Meds: 4    Family History  Problem Relation Age of Onset   Esophageal cancer Father    Heart disease Father        Unknown what kind   Hyperlipidemia Father    Hypertension Father    Cancer Father    Heart failure Father    Hyperlipidemia Mother    Hypertension Mother    CAD Sister        10 years younger than patient - had stent. Was told that her anorexia/bulimia may have played a role   Stroke Maternal Grandmother 66   Diabetes Maternal Grandmother     Heart attack Maternal Grandfather 69   COPD Maternal Grandfather    Heart attack Paternal Grandmother        59s   Diabetes Other        MGGM   Social History   Socioeconomic History   Marital status: Married    Spouse name: Ted   Number of children: 2   Years of education: Not on file   Highest education level: Master's degree (e.g., MA, MS, MEng, MEd, MSW, MBA)  Occupational History   Occupation: retired Runner, broadcasting/film/video  Tobacco Use   Smoking status: Former    Current packs/day: 0.00    Average packs/day: 0.5 packs/day for 10.0 years (5.0 ttl pk-yrs)    Types: Cigarettes    Start date: 12/11/1963    Quit date: 12/10/1973    Years since quitting: 49.7   Smokeless tobacco: Never  Vaping Use   Vaping status: Never Used  Substance and Sexual Activity   Alcohol use: Yes    Comment: occasional   Drug use: No   Sexual activity: Not on file  Other Topics Concern   Not on file  Social History Narrative   2 children ages 68,35   Social Drivers of Health   Financial Resource Strain: Low Risk  (12/23/2022)  Overall Financial Resource Strain (CARDIA)    Difficulty of Paying Living Expenses: Not hard at all  Food Insecurity: No Food Insecurity (12/23/2022)   Hunger Vital Sign    Worried About Running Out of Food in the Last Year: Never true    Ran Out of Food in the Last Year: Never true  Transportation Needs: No Transportation Needs (12/23/2022)   PRAPARE - Administrator, Civil Service (Medical): No    Lack of Transportation (Non-Medical): No  Physical Activity: Inactive (12/23/2022)   Exercise Vital Sign    Days of Exercise per Week: 0 days    Minutes of Exercise per Session: 40 min  Stress: Stress Concern Present (12/23/2022)   [employer] of Occupational Health - Occupational Stress Questionnaire    Feeling of Stress : To some extent  Social Connections: Socially Integrated (12/23/2022)   Social Connection and Isolation Panel [NHANES]    Frequency of  Communication with Friends and Family: More than three times a week    Frequency of Social Gatherings with Friends and Family: Twice a week    Attends Religious Services: More than 4 times per year    Active Member of Clubs or Organizations: Yes    Attends Banker Meetings: More than 4 times per year    Marital Status: Married   Past Surgical History:  Procedure Laterality Date   APPENDECTOMY  07-02-2005   dr Zachery Dakins   open   CARDIAC CATHETERIZATION  01-23-2009  dr Smitty Cords brodie   normal coronary arteries and lvf   CARDIOVASCULAR STRESS TEST  03/31/2016   Low risk nuclear study w/ medium defect of mild severity in basal anterior and mid anterior location w/ no evidence ischemia or infarction/  normal LV function and wall motion,  nuclear stress ef 71%   CATARACT EXTRACTION W/ INTRAOCULAR LENS IMPLANT Left 09/2016   COLONOSCOPY  last one 04-24-2016   DRUG INDUCED ENDOSCOPY Bilateral 02/11/2022   Procedure: DRUG INDUCED ENDOSCOPY;  Surgeon: Christia Reading, MD;  Location: [facility];  Service: ENT;  Laterality: Bilateral;   HYSTEROSCOPY WITH D & C N/A 12/22/2016   Procedure: DILATATION AND CURETTAGE /HYSTEROSCOPY;  Surgeon: Marcelle Overlie, MD;  Location: [facility];  Service: Gynecology;  Laterality: N/A;   IMPLANTATION OF HYPOGLOSSAL NERVE STIMULATOR Right 04/21/2022   Procedure: IMPLANTATION OF HYPOGLOSSAL NERVE STIMULATOR;  Surgeon: Christia Reading, MD;  Location: [facility];  Service: ENT;  Laterality: Right;   IR US GUIDANCE  09/08/2016   POSTERIOR LUMBAR FUSION  02/ 2018    [facility] ([place], [place])   SKIN CANCER EXCISION     TONSILLECTOMY  child   TOTAL KNEE ARTHROPLASTY Left 04/09/2015   Procedure: LEFT TOTAL KNEE ARTHROPLASTY;  Surgeon: Durene Romans, MD;  Location: [facility];  Service: Orthopedics;  Laterality: Left;   TRANSTHORACIC ECHOCARDIOGRAM  01/21/2009   mild LVH, ef 75-80%, grade 1 diastolic dysfunction   ULNAR  NERVE TRANSPOSITION Left 12-20-2008  dr sypher   decompression and partial resection medial triceps fascia   WIDE LOCAL EXCISION PILONIDAL AREA  02-02-2002  dr Zachery Dakins   recurrent squamous cell carcinoma in situ   Past Medical History:  Diagnosis Date   Allergic rhinitis    Apnea    Arthritis    Asthma    very mild   Back pain    Bilateral lower extremity edema    Carotid artery disease (HCC)    a. Carotid duplex 03/2016 - duplex  was stable, 40-59% RICA, 1-39% LICA   Complication of anesthesia    post-op delirium   Endometrial polyp    Essential hypertension    GERD (gastroesophageal reflux disease)    Heart murmur    per pt   History of adenomatous polyp of colon    04-24-2016  tubular adenoma   History of squamous cell carcinoma excision    pilonidal area second excision 02-02-2002   HTN (hypertension)    Hyperlipidemia    Leg pain    OAB (overactive bladder)    RA (rheumatoid arthritis) (HCC)    RBBB (right bundle branch block with left anterior fascicular block)    RLS (restless legs syndrome)    Shortness of breath on exertion    SUI (stress urinary incontinence, female)    Venous insufficiency    legs   Vitamin D deficiency    BP (!) 96/53   Pulse 99   Ht 5\' 8"  (1.727 m)   Wt 213 lb (96.6 kg)   SpO2 94%   BMI 32.39 kg/m   Opioid Risk Score:   Fall Risk Score:  `1  Depression screen [facility] 2/9     07/23/2023   11:11 AM 03/22/2023    1:41 PM 01/29/2023    2:01 PM 01/08/2023   11:16 AM 12/24/2022    2:41 PM 12/10/2022    3:45 PM 12/08/2022    2:01 PM  Depression screen PHQ 2/9  Decreased Interest 0 0 0 2 2 2 2   Down, Depressed, Hopeless 0 0 0 2 2 2 2   PHQ - 2 Score 0 0 0 4 4 4 4   Altered sleeping     2 3 3   Tired, decreased energy     2 2 2   Change in appetite     1 2 2   Feeling bad or failure about yourself      2 2 2   Trouble concentrating     1 1 1   Moving slowly or fidgety/restless     0 0 0  Suicidal thoughts     0 0 0  PHQ-9 Score     12 14 14    Difficult doing work/chores     Somewhat difficult        Review of Systems  Respiratory:  Positive for apnea.   Endocrine:       High/low bs  Musculoskeletal:        B/L leg pain  All other systems reviewed and are negative.      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   -hydrocodone refilled  -continue Lyrica  -add amitriptyline 10mg  HS  -discussed that THC was still present in the urine.   Prescribed Zynex Nexwave and heating/cooling blanket   Pain contract and urine sample today  -continue ozempic  -Discussed Qutenza as an option for neuropathic pain control. Discussed that this is a capsaicin patch, stronger than capsaicin cream. Discussed that it is currently approved for diabetic peripheral neuropathy and post-herpetic neuralgia, but that it has also shown benefit in treating other forms of neuropathy. Provided patient with link to site to learn more  about the patch: https://www.clark.biz/. Discussed that the patch would be placed in office and benefits usually last 3 months. Discussed that unintended exposure to capsaicin can cause severe irritation of eyes, mucous membranes, respiratory tract, and skin, but that Qutenza is a local treatment and does not have the systemic side effects of other nerve medications. Discussed that there may be pain, itching, erythema, and decreased sensory function associated with the application of Qutenza. Side effects usually subside within 1 week. A cold pack of analgesic medications can help with these side effects. Blood pressure can also be increased due to  pain associated with administration of the patch.   -Discussed benefits of exercise in reducing pain. -Discussed following foods that may reduce pain: 1) Ginger (especially studied for arthritis)- reduce leukotriene production to decrease inflammation 2) Blueberries- high in phytonutrients that decrease inflammation 3) Salmon- marine omega-3s reduce joint swelling and pain 4) Pumpkin seeds- reduce inflammation 5) dark chocolate- reduces inflammation 6) turmeric- reduces inflammation 7) tart cherries - reduce pain and stiffness 8) extra virgin olive oil - its compound olecanthal helps to block prostaglandins  9) chili peppers- can be eaten or applied topically via capsaicin 10) mint- helpful for headache, muscle aches, joint pain, and itching 11) garlic- reduces inflammation  Link to further information on diet for chronic pain: http://www.bray.com/    2) Restless leg syndrome:  continue Requip to 2mg  HS -restart Lyrica -discussed that she failed a patch before.   3) Hypotension: -decrease Hydralazine to 25mg  TID  4) Insomnia: -Try to go outside near sunrise -Get exercise during the day.  -Turn off all devices an hour before bedtime.  -Teas that can benefit: chamomile, valerian root, Brahmi (Bacopa) -Can consider over the counter melatonin, magnesium, and/or L-theanine. Melatonin is an anti-oxidant with multiple health benefits. Magnesium is involved in greater than 300 enzymatic reactions in the body and most of [name] are deficient as our soil is often depleted. There are 7 different types of magnesium- Bioptemizer's is a supplement with all 7 types, and each has unique benefits. Magnesium can also help with constipation and anxiety.  -Pistachios naturally increase the production of melatonin -Cozy Earth bamboo bed sheets are free from toxic chemicals.  -Tart cherry juice or a tart cherry supplement can improve  sleep and soreness post-workout

## 2023-08-16 NOTE — Patient Instructions (Signed)
 Insomnia: -Try to go outside near sunrise -Get exercise during the day.  -Turn off all devices an hour before bedtime.  -Teas that can benefit: chamomile, valerian root, Brahmi (Bacopa) -Can consider over the counter melatonin, magnesium, and/or L-theanine. Melatonin is an anti-oxidant with multiple health benefits. Magnesium is involved in greater than 300 enzymatic reactions in the body and most of [name] are deficient as our soil is often depleted. There are 7 different types of magnesium- Bioptemizer's is a supplement with all 7 types, and each has unique benefits. Magnesium can also help with constipation and anxiety.  -Pistachios naturally increase the production of melatonin -Cozy Earth bamboo bed sheets are free from toxic chemicals.  -Tart cherry juice or a tart cherry supplement can improve sleep and soreness post-workout    Foods that may reduce pain: 1) Ginger (especially studied for arthritis)- reduce leukotriene production to decrease inflammation 2) Blueberries- high in phytonutrients that decrease inflammation 3) Salmon- marine omega-3s reduce joint swelling and pain 4) Pumpkin seeds- reduce inflammation 5) dark chocolate- reduces inflammation 6) turmeric- reduces inflammation 7) tart cherries - reduce pain and stiffness 8) extra virgin olive oil - its compound olecanthal helps to block prostaglandins  9) chili peppers- can be eaten or applied topically via capsaicin 10) mint- helpful for headache, muscle aches, joint pain, and itching 11) garlic- reduces inflammation 12) Green tea- reduces inflammation and oxidative stress, helps with weight loss, may reduce the risk of cancer, recommend Double Green Matcha [place] of Tea daily  Link to further information on diet for chronic pain: http://www.bray.com/

## 2023-08-25 DIAGNOSIS — L84 Corns and callosities: Secondary | ICD-10-CM | POA: Diagnosis not present

## 2023-08-25 DIAGNOSIS — I739 Peripheral vascular disease, unspecified: Secondary | ICD-10-CM | POA: Diagnosis not present

## 2023-08-25 DIAGNOSIS — B351 Tinea unguium: Secondary | ICD-10-CM | POA: Diagnosis not present

## 2023-08-25 DIAGNOSIS — L603 Nail dystrophy: Secondary | ICD-10-CM | POA: Diagnosis not present

## 2023-08-26 ENCOUNTER — Other Ambulatory Visit: Payer: Self-pay | Admitting: Internal Medicine

## 2023-08-26 DIAGNOSIS — G2581 Restless legs syndrome: Secondary | ICD-10-CM | POA: Diagnosis not present

## 2023-08-26 DIAGNOSIS — G894 Chronic pain syndrome: Secondary | ICD-10-CM | POA: Diagnosis not present

## 2023-08-26 DIAGNOSIS — E1142 Type 2 diabetes mellitus with diabetic polyneuropathy: Secondary | ICD-10-CM | POA: Diagnosis not present

## 2023-09-01 ENCOUNTER — Encounter: Payer: Self-pay | Admitting: Internal Medicine

## 2023-09-07 ENCOUNTER — Other Ambulatory Visit: Payer: Self-pay | Admitting: Internal Medicine

## 2023-09-07 ENCOUNTER — Telehealth: Payer: Self-pay | Admitting: Physical Medicine and Rehabilitation

## 2023-09-07 NOTE — Telephone Encounter (Signed)
 Wanted doctor to know she is seeing a provider at [facility] and he is taking her off of Hydrocodone , she canceled her appt. For May.

## 2023-09-09 DIAGNOSIS — G4733 Obstructive sleep apnea (adult) (pediatric): Secondary | ICD-10-CM | POA: Diagnosis not present

## 2023-09-13 ENCOUNTER — Ambulatory Visit: Admitting: Family Medicine

## 2023-09-13 ENCOUNTER — Encounter: Admitting: Physical Medicine and Rehabilitation

## 2023-09-14 ENCOUNTER — Encounter: Payer: Self-pay | Admitting: Internal Medicine

## 2023-09-14 NOTE — Patient Instructions (Addendum)
   [facility] - Dr Waymond Hailey, Dr Larrie Po, Dr Lamon Pillow   Blood work was ordered.       Medications changes include :   mounjaro once done with ozempic    An MRI was ordered.      Return in about 6 months (around 03/17/2024) for follow up.

## 2023-09-14 NOTE — Progress Notes (Unsigned)
 Subjective:    Patient ID: Erin Roach, female    DOB: 01/12/1941, 83 y.o.   MRN: [id number]     HPI Erin Roach is here for follow up of her chronic medical problems.  Back pain - has seen Dr Aviva Lemmings in the past.   MRI lumbar spine 03/2022.  Needs MRI of spine  Saw Dr Jolena Nay at [facility]- RLS meds changed.    Medications and allergies reviewed with patient and updated if appropriate.  Current Outpatient Medications on File Prior to Visit  Medication Sig Dispense Refill   amitriptyline  (ELAVIL ) 10 MG tablet Take 1 tablet (10 mg total) by mouth at bedtime. 30 tablet 1   aspirin  EC 81 MG tablet Take 1 tablet (81 mg total) by mouth daily. Swallow whole. 30 tablet 12   cholecalciferol (VITAMIN D3) 25 MCG (1000 UNIT) tablet Take 1,000 Units by mouth daily.     co-enzyme Q-10 30 MG capsule Take 30 mg by mouth daily.     DULoxetine  (CYMBALTA ) 20 MG capsule Take 1 capsule (20 mg total) by mouth daily. 90 capsule 3   ezetimibe  (ZETIA ) 10 MG tablet TAKE 1 TABLET BY MOUTH DAILY 90 tablet 3   fluticasone  (FLONASE ) 50 MCG/ACT nasal spray PLACE 1 SPRAY INTO EACH NOSTRIL ONCE DAILY 16 mL 11   furosemide  (LASIX ) 20 MG tablet Take 2 tablets (40 mg total) by mouth daily. 180 tablet 1   HORIZANT  600 MG TBCR Take 1 tablet (600 mg total) by mouth at bedtime. 90 tablet 0   hydrALAZINE  (APRESOLINE ) 25 MG tablet Take 1 tablet (25 mg total) by mouth 3 (three) times daily. 90 tablet 3   HYDROcodone -acetaminophen  (NORCO/VICODIN) 5-325 MG tablet Take 1 tablet by mouth 2 (two) times daily as needed for moderate pain (pain score 4-6). 60 tablet 0   lidocaine  (LIDODERM ) 5 % APPLY 1 PATCH TO AFFECTED AREA FOR 12 HOURS IN A 24 HOUR PERIOD 30 patch 0   magnesium  gluconate (MAGONATE) 500 MG tablet Take 500 mg by mouth 2 (two) times daily.     OZEMPIC , 2 MG/DOSE, 8 MG/3ML SOPN DIAL AND INJECT UNDER THE SKIN 2 MG WEEKLY 3 mL 5   pramipexole  (MIRAPEX ) 0.25 MG tablet Take by mouth.     pregabalin  (LYRICA ) 75 MG  capsule TAKE 1 CAPSULE BY MOUTH EVERY AFTERNOON AND TAKE 2 CAPSULES BY MOUTH EVERY NIGHT AT BEDTIME 270 capsule 3   simvastatin  (ZOCOR ) 40 MG tablet TAKE 1 TABLET BY MOUTH EVERY NIGHT AT BEDTIME 90 tablet 1   spironolactone  (ALDACTONE ) 25 MG tablet Take 1 tablet (25 mg total) by mouth daily. 90 tablet 0   No current facility-administered medications on file prior to visit.     Review of Systems     Objective:  There were no vitals filed for this visit. BP Readings from Last 3 Encounters:  08/16/23 (!) 96/53  07/23/23 101/67  04/28/23 132/70   Wt Readings from Last 3 Encounters:  08/16/23 213 lb (96.6 kg)  07/23/23 221 lb (100.2 kg)  04/28/23 214 lb (97.1 kg)   There is no height or weight on file to calculate BMI.    Physical Exam     Lab Results  Component Value Date   WBC 7.3 03/21/2023   HGB 13.0 03/21/2023   HCT 37.8 03/21/2023   PLT 215 03/21/2023   GLUCOSE 164 (H) 03/21/2023   CHOL 191 05/18/2022   TRIG 231.0 (H) 05/18/2022   HDL 53.10 05/18/2022  LDLDIRECT 110.0 05/18/2022   LDLCALC 55 09/29/2021   ALT 21 09/26/2022   AST 15 09/26/2022   NA 137 03/21/2023   K 4.7 03/21/2023   CL 106 03/21/2023   CREATININE 1.03 (H) 03/21/2023   BUN 22 03/21/2023   CO2 23 03/21/2023   TSH 2.281 09/26/2022   INR 1.03 03/27/2015   HGBA1C 6.3 05/18/2022   MICROALBUR <0.7 12/05/2021     Assessment & Plan:    See Problem List for Assessment and Plan of chronic medical problems.

## 2023-09-15 ENCOUNTER — Ambulatory Visit (INDEPENDENT_AMBULATORY_CARE_PROVIDER_SITE_OTHER): Admitting: Internal Medicine

## 2023-09-15 VITALS — BP 132/82 | HR 70 | Temp 98.2°F | Ht 68.0 in | Wt 221.0 lb

## 2023-09-15 DIAGNOSIS — E7849 Other hyperlipidemia: Secondary | ICD-10-CM | POA: Diagnosis not present

## 2023-09-15 DIAGNOSIS — G2581 Restless legs syndrome: Secondary | ICD-10-CM

## 2023-09-15 DIAGNOSIS — I10 Essential (primary) hypertension: Secondary | ICD-10-CM

## 2023-09-15 DIAGNOSIS — R809 Proteinuria, unspecified: Secondary | ICD-10-CM | POA: Diagnosis not present

## 2023-09-15 DIAGNOSIS — M545 Low back pain, unspecified: Secondary | ICD-10-CM

## 2023-09-15 DIAGNOSIS — E1129 Type 2 diabetes mellitus with other diabetic kidney complication: Secondary | ICD-10-CM | POA: Diagnosis not present

## 2023-09-15 DIAGNOSIS — G4709 Other insomnia: Secondary | ICD-10-CM

## 2023-09-15 DIAGNOSIS — E538 Deficiency of other specified B group vitamins: Secondary | ICD-10-CM

## 2023-09-15 DIAGNOSIS — G8929 Other chronic pain: Secondary | ICD-10-CM

## 2023-09-15 DIAGNOSIS — G6289 Other specified polyneuropathies: Secondary | ICD-10-CM

## 2023-09-15 DIAGNOSIS — Z7985 Long-term (current) use of injectable non-insulin antidiabetic drugs: Secondary | ICD-10-CM

## 2023-09-15 DIAGNOSIS — E559 Vitamin D deficiency, unspecified: Secondary | ICD-10-CM | POA: Diagnosis not present

## 2023-09-15 LAB — CBC WITH DIFFERENTIAL/PLATELET
Basophils Absolute: 0.1 10*3/uL (ref 0.0–0.1)
Basophils Relative: 1 % (ref 0.0–3.0)
Eosinophils Absolute: 0.3 10*3/uL (ref 0.0–0.7)
Eosinophils Relative: 4.8 % (ref 0.0–5.0)
HCT: 35.3 % — ABNORMAL LOW (ref 36.0–46.0)
Hemoglobin: 12.1 g/dL (ref 12.0–15.0)
Lymphocytes Relative: 21.8 % (ref 12.0–46.0)
Lymphs Abs: 1.3 10*3/uL (ref 0.7–4.0)
MCHC: 34.3 g/dL (ref 30.0–36.0)
MCV: 92.6 fl (ref 78.0–100.0)
Monocytes Absolute: 0.6 10*3/uL (ref 0.1–1.0)
Monocytes Relative: 10.4 % (ref 3.0–12.0)
Neutro Abs: 3.7 10*3/uL (ref 1.4–7.7)
Neutrophils Relative %: 62 % (ref 43.0–77.0)
Platelets: 225 10*3/uL (ref 150.0–400.0)
RBC: 3.82 Mil/uL — ABNORMAL LOW (ref 3.87–5.11)
RDW: 12.6 % (ref 11.5–15.5)
WBC: 5.9 10*3/uL (ref 4.0–10.5)

## 2023-09-15 LAB — LIPID PANEL
Cholesterol: 145 mg/dL (ref 0–200)
HDL: 43.8 mg/dL (ref >39.00)
LDL Cholesterol: 67 mg/dL (ref 0–99)
NonHDL: 100.95
Total CHOL/HDL Ratio: 3
Triglycerides: 169 mg/dL — ABNORMAL HIGH (ref 0.0–149.0)
VLDL: 33.8 mg/dL (ref 0.0–40.0)

## 2023-09-15 LAB — COMPREHENSIVE METABOLIC PANEL WITH GFR
ALT: 22 U/L (ref 0–35)
AST: 18 U/L (ref 0–37)
Albumin: 4.1 g/dL (ref 3.5–5.2)
Alkaline Phosphatase: 58 U/L (ref 39–117)
BUN: 20 mg/dL (ref 6–23)
CO2: 25 meq/L (ref 19–32)
Calcium: 8.9 mg/dL (ref 8.4–10.5)
Chloride: 106 meq/L (ref 96–112)
Creatinine, Ser: 1.22 mg/dL — ABNORMAL HIGH (ref 0.40–1.20)
GFR: 41.86 mL/min — ABNORMAL LOW (ref >60.00)
Glucose, Bld: 88 mg/dL (ref 70–99)
Potassium: 5.5 meq/L — ABNORMAL HIGH (ref 3.5–5.1)
Sodium: 138 meq/L (ref 135–145)
Total Bilirubin: 0.4 mg/dL (ref 0.2–1.2)
Total Protein: 6.5 g/dL (ref 6.0–8.3)

## 2023-09-15 LAB — VITAMIN D 25 HYDROXY (VIT D DEFICIENCY, FRACTURES): VITD: 45.91 ng/mL (ref 30.00–100.00)

## 2023-09-15 LAB — VITAMIN B12: Vitamin B-12: 1258 pg/mL — ABNORMAL HIGH (ref 211–911)

## 2023-09-15 LAB — HEMOGLOBIN A1C: Hgb A1c MFr Bld: 5.9 % (ref 4.6–6.5)

## 2023-09-15 MED ORDER — FUROSEMIDE 20 MG PO TABS: 40.0000 mg | ORAL_TABLET | Freq: Every day | ORAL | Status: DC | PRN

## 2023-09-15 MED ORDER — TIRZEPATIDE 5 MG/0.5ML ~~LOC~~ SOAJ: 5.0000 mg | SUBCUTANEOUS | Status: DC

## 2023-09-15 NOTE — Assessment & Plan Note (Addendum)
 Chronic  Lab Results  Component Value Date   HGBA1C 6.3 05/18/2022   Sugars well controlled Check A1c, urine microalbumin On Ozempic  2 mg weekly - no side effects - has not lost weight - interested in mounjaro Will start mounjaro 5 mg weekly once she finishes the Ozempic  that she has Stressed regular exercise, diabetic diet

## 2023-09-15 NOTE — Assessment & Plan Note (Signed)
 Chronic Management per Dr. Alessandra Ancona Likely causing her balance problems Currently on Lyrica  75 mg in the afternoon and 150 mg at night, correspond 600 mg at bedtime, amitriptyline  10 mg at bedtime and duloxetine  20 mg daily

## 2023-09-15 NOTE — Assessment & Plan Note (Signed)
 Chronic Management per Dr. Jolena Nay at [facility] On methadone, Requip , Mirapex , horizont, Lyrica 

## 2023-09-15 NOTE — Assessment & Plan Note (Signed)
 Subacute Started about 6 months ago-this is new type of pain S/p 2 surgeries in the past Has some numbness in the buttock region, but no radiation of the pain Taking Tylenol , ibuprofen and using lidocaine  patch which does help to some degree, but pain is not improving Given previous history of surgeries and new pain we will request an MRI to evaluate further Recommend that she see neurosurgery

## 2023-09-15 NOTE — Assessment & Plan Note (Signed)
Chronic Check lipid panel, CMP Continue simvastatin 40 mg daily

## 2023-09-15 NOTE — Assessment & Plan Note (Signed)
 Chronic Still struggles with sleep because of restless leg syndrome and neuropathy, but much improved Medication she is on for the neuropathy and RLS is likely helping some with her sleep

## 2023-09-15 NOTE — Assessment & Plan Note (Signed)
 Chronic Blood pressure initially elevated-improved on repeat and looks like it has been much lower recently No change in medication CMP Continue hydralazine  25 mg 3 times daily, spironolactone  25 mg daily

## 2023-09-15 NOTE — Assessment & Plan Note (Signed)
Chronic Check b12 level

## 2023-09-15 NOTE — Assessment & Plan Note (Signed)
Chronic ?Check vitamin d level ?

## 2023-09-16 ENCOUNTER — Encounter: Payer: Self-pay | Admitting: Internal Medicine

## 2023-09-16 ENCOUNTER — Other Ambulatory Visit: Payer: Self-pay | Admitting: Internal Medicine

## 2023-09-16 LAB — MICROALBUMIN / CREATININE URINE RATIO
Creatinine,U: 78.3 mg/dL
Microalb Creat Ratio: 25.7 mg/g (ref 0.0–30.0)
Microalb, Ur: 2 mg/dL — ABNORMAL HIGH (ref 0.0–1.9)

## 2023-09-16 MED ORDER — SPIRONOLACTONE 25 MG PO TABS: 12.5000 mg | ORAL_TABLET | Freq: Every day | ORAL | Status: DC

## 2023-09-25 DIAGNOSIS — E1142 Type 2 diabetes mellitus with diabetic polyneuropathy: Secondary | ICD-10-CM | POA: Diagnosis not present

## 2023-09-25 DIAGNOSIS — G2581 Restless legs syndrome: Secondary | ICD-10-CM | POA: Diagnosis not present

## 2023-09-25 DIAGNOSIS — G894 Chronic pain syndrome: Secondary | ICD-10-CM | POA: Diagnosis not present

## 2023-10-05 DIAGNOSIS — E611 Iron deficiency: Secondary | ICD-10-CM | POA: Diagnosis not present

## 2023-10-05 DIAGNOSIS — G2581 Restless legs syndrome: Secondary | ICD-10-CM | POA: Diagnosis not present

## 2023-10-05 DIAGNOSIS — F1190 Opioid use, unspecified, uncomplicated: Secondary | ICD-10-CM | POA: Diagnosis not present

## 2023-10-07 DIAGNOSIS — G4733 Obstructive sleep apnea (adult) (pediatric): Secondary | ICD-10-CM | POA: Diagnosis not present

## 2023-10-07 DIAGNOSIS — Z4542 Encounter for adjustment and management of neuropacemaker (brain) (peripheral nerve) (spinal cord): Secondary | ICD-10-CM | POA: Diagnosis not present

## 2023-10-11 ENCOUNTER — Other Ambulatory Visit: Payer: Self-pay | Admitting: Physical Medicine and Rehabilitation

## 2023-10-14 ENCOUNTER — Other Ambulatory Visit: Payer: Self-pay | Admitting: Physical Medicine and Rehabilitation

## 2023-10-14 ENCOUNTER — Other Ambulatory Visit: Payer: Self-pay | Admitting: Internal Medicine

## 2023-10-15 ENCOUNTER — Other Ambulatory Visit: Payer: Self-pay | Admitting: Internal Medicine

## 2023-10-15 ENCOUNTER — Encounter: Admitting: Physical Medicine and Rehabilitation

## 2023-10-17 ENCOUNTER — Other Ambulatory Visit: Payer: Self-pay | Admitting: Internal Medicine

## 2023-10-26 DIAGNOSIS — E1142 Type 2 diabetes mellitus with diabetic polyneuropathy: Secondary | ICD-10-CM | POA: Diagnosis not present

## 2023-10-26 DIAGNOSIS — G2581 Restless legs syndrome: Secondary | ICD-10-CM | POA: Diagnosis not present

## 2023-10-26 DIAGNOSIS — G894 Chronic pain syndrome: Secondary | ICD-10-CM | POA: Diagnosis not present

## 2023-10-29 ENCOUNTER — Other Ambulatory Visit: Payer: Self-pay | Admitting: Internal Medicine

## 2023-10-31 ENCOUNTER — Encounter: Payer: Self-pay | Admitting: Internal Medicine

## 2023-10-31 MED ORDER — TIRZEPATIDE 5 MG/0.5ML ~~LOC~~ SOAJ: 5.0000 mg | SUBCUTANEOUS | 0 refills | Status: DC

## 2023-11-11 ENCOUNTER — Encounter: Payer: Self-pay | Admitting: Internal Medicine

## 2023-11-14 ENCOUNTER — Encounter: Payer: Self-pay | Admitting: Internal Medicine

## 2023-11-15 ENCOUNTER — Other Ambulatory Visit: Payer: Self-pay

## 2023-11-23 ENCOUNTER — Other Ambulatory Visit: Payer: Self-pay | Admitting: Internal Medicine

## 2023-11-25 DIAGNOSIS — G2581 Restless legs syndrome: Secondary | ICD-10-CM | POA: Diagnosis not present

## 2023-11-25 DIAGNOSIS — E1142 Type 2 diabetes mellitus with diabetic polyneuropathy: Secondary | ICD-10-CM | POA: Diagnosis not present

## 2023-11-25 DIAGNOSIS — G894 Chronic pain syndrome: Secondary | ICD-10-CM | POA: Diagnosis not present

## 2023-12-04 ENCOUNTER — Other Ambulatory Visit: Payer: Self-pay | Admitting: Internal Medicine

## 2023-12-04 ENCOUNTER — Other Ambulatory Visit: Payer: Self-pay | Admitting: Physical Medicine and Rehabilitation

## 2023-12-10 ENCOUNTER — Other Ambulatory Visit: Payer: Self-pay | Admitting: Internal Medicine

## 2023-12-10 ENCOUNTER — Encounter: Payer: Self-pay | Admitting: Internal Medicine

## 2023-12-13 MED ORDER — LOSARTAN POTASSIUM 50 MG PO TABS: 50.0000 mg | ORAL_TABLET | Freq: Every day | ORAL | 1 refills | Status: DC

## 2023-12-13 NOTE — Addendum Note (Signed)
 Addended by: GEOFM GLADE PARAS on: 12/13/2023 09:29 AM   Modules accepted: Orders

## 2023-12-14 ENCOUNTER — Ambulatory Visit: Payer: Medicare PPO

## 2023-12-15 ENCOUNTER — Other Ambulatory Visit: Payer: Self-pay | Admitting: Internal Medicine

## 2023-12-15 DIAGNOSIS — G4733 Obstructive sleep apnea (adult) (pediatric): Secondary | ICD-10-CM | POA: Diagnosis not present

## 2023-12-15 DIAGNOSIS — Z4542 Encounter for adjustment and management of neuropacemaker (brain) (peripheral nerve) (spinal cord): Secondary | ICD-10-CM | POA: Diagnosis not present

## 2023-12-21 ENCOUNTER — Other Ambulatory Visit: Payer: Self-pay | Admitting: Internal Medicine

## 2023-12-21 DIAGNOSIS — L84 Corns and callosities: Secondary | ICD-10-CM | POA: Diagnosis not present

## 2023-12-21 DIAGNOSIS — L603 Nail dystrophy: Secondary | ICD-10-CM | POA: Diagnosis not present

## 2023-12-21 DIAGNOSIS — I872 Venous insufficiency (chronic) (peripheral): Secondary | ICD-10-CM | POA: Diagnosis not present

## 2023-12-21 DIAGNOSIS — E1151 Type 2 diabetes mellitus with diabetic peripheral angiopathy without gangrene: Secondary | ICD-10-CM | POA: Diagnosis not present

## 2023-12-21 DIAGNOSIS — I739 Peripheral vascular disease, unspecified: Secondary | ICD-10-CM | POA: Diagnosis not present

## 2023-12-21 DIAGNOSIS — B351 Tinea unguium: Secondary | ICD-10-CM | POA: Diagnosis not present

## 2023-12-26 DIAGNOSIS — G2581 Restless legs syndrome: Secondary | ICD-10-CM | POA: Diagnosis not present

## 2023-12-26 DIAGNOSIS — G894 Chronic pain syndrome: Secondary | ICD-10-CM | POA: Diagnosis not present

## 2023-12-26 DIAGNOSIS — E1142 Type 2 diabetes mellitus with diabetic polyneuropathy: Secondary | ICD-10-CM | POA: Diagnosis not present

## 2023-12-29 ENCOUNTER — Other Ambulatory Visit: Payer: Self-pay | Admitting: Internal Medicine

## 2024-01-07 ENCOUNTER — Encounter: Payer: Self-pay | Admitting: Internal Medicine

## 2024-01-11 ENCOUNTER — Ambulatory Visit: Admitting: Sports Medicine

## 2024-01-17 ENCOUNTER — Other Ambulatory Visit: Payer: Self-pay | Admitting: Internal Medicine

## 2024-01-23 ENCOUNTER — Other Ambulatory Visit: Payer: Self-pay | Admitting: Internal Medicine

## 2024-01-23 ENCOUNTER — Other Ambulatory Visit: Payer: Self-pay | Admitting: Physical Medicine and Rehabilitation

## 2024-01-24 DIAGNOSIS — E611 Iron deficiency: Secondary | ICD-10-CM | POA: Diagnosis not present

## 2024-01-24 DIAGNOSIS — G2581 Restless legs syndrome: Secondary | ICD-10-CM | POA: Diagnosis not present

## 2024-01-24 DIAGNOSIS — G4733 Obstructive sleep apnea (adult) (pediatric): Secondary | ICD-10-CM | POA: Diagnosis not present

## 2024-01-24 DIAGNOSIS — F1190 Opioid use, unspecified, uncomplicated: Secondary | ICD-10-CM | POA: Diagnosis not present

## 2024-01-25 DIAGNOSIS — H2511 Age-related nuclear cataract, right eye: Secondary | ICD-10-CM | POA: Diagnosis not present

## 2024-01-25 DIAGNOSIS — H43813 Vitreous degeneration, bilateral: Secondary | ICD-10-CM | POA: Diagnosis not present

## 2024-01-25 DIAGNOSIS — Z961 Presence of intraocular lens: Secondary | ICD-10-CM | POA: Diagnosis not present

## 2024-01-25 DIAGNOSIS — E119 Type 2 diabetes mellitus without complications: Secondary | ICD-10-CM | POA: Diagnosis not present

## 2024-01-25 LAB — HM DIABETES EYE EXAM

## 2024-01-26 DIAGNOSIS — E1142 Type 2 diabetes mellitus with diabetic polyneuropathy: Secondary | ICD-10-CM | POA: Diagnosis not present

## 2024-01-26 DIAGNOSIS — G894 Chronic pain syndrome: Secondary | ICD-10-CM | POA: Diagnosis not present

## 2024-01-26 DIAGNOSIS — G2581 Restless legs syndrome: Secondary | ICD-10-CM | POA: Diagnosis not present

## 2024-01-27 ENCOUNTER — Other Ambulatory Visit (INDEPENDENT_AMBULATORY_CARE_PROVIDER_SITE_OTHER): Payer: Self-pay

## 2024-01-27 ENCOUNTER — Encounter: Payer: Self-pay | Admitting: Sports Medicine

## 2024-01-27 ENCOUNTER — Ambulatory Visit: Admitting: Sports Medicine

## 2024-01-27 DIAGNOSIS — M7742 Metatarsalgia, left foot: Secondary | ICD-10-CM

## 2024-01-27 DIAGNOSIS — M79672 Pain in left foot: Secondary | ICD-10-CM

## 2024-01-27 DIAGNOSIS — M216X2 Other acquired deformities of left foot: Secondary | ICD-10-CM

## 2024-01-27 DIAGNOSIS — M2142 Flat foot [pes planus] (acquired), left foot: Secondary | ICD-10-CM

## 2024-01-27 DIAGNOSIS — M2141 Flat foot [pes planus] (acquired), right foot: Secondary | ICD-10-CM | POA: Diagnosis not present

## 2024-01-27 NOTE — Progress Notes (Signed)
 Erin Roach - 83 y.o. female MRN [id number]  Date of birth: 12-21-1940  Office Visit Note: Visit Date: 01/27/2024 PCP: Geofm Glade PARAS, MD Referred by: Geofm Glade PARAS, MD  Subjective: Chief Complaint  Patient presents with   Left Foot - Pain   HPI: Erin Roach is a very pleasant 83 y.o. female who presents today for chronic left dorsal and plantar midfoot pain.  Jovonne has had pain in her left foot for a few months now.  She does have chronic bilateral lower extremity edema from venous insufficiency, but feels a different type pain both on the dorsal aspect of the midfoot and over the plantar side.  This will radiate into the toes.  She does not have any pain at the heel.  He has not taking any medication for this specifically.  She does have rather significant RLS and has been following with a good physician at [facility].  Has treated her with methadone 5 mg, 1/2 tablet at night which has been significantly helpful.  Pertinent ROS were reviewed with the patient and found to be negative unless otherwise specified above in HPI.   Assessment & Plan: Visit Diagnoses:  1. Pain in left foot   2. Pes planus of both feet   3. Metatarsalgia of left foot   4. Loss of transverse plantar arch, left    Plan: Impression is chronic left foot pain with evidence of advanced transverse arch loss and metatarsalgia in the setting of bilateral pes planus. There is some associated midfoot arthritic change as well.  We discussed considering green sports insoles with metatarsal padding for the left side as well as scaphoid padding to help support the longitudinal arch of both feet.  She may continue her methadone 5 mg, 1/2 tablet in the morning and 1 tablet at night per her physician at [facility] for her RLS but also would likely help her foot pain.  We discussed shoewear modification.  We will see her back over the next 2 weeks to recheck her feet as well as building green sports insoles for her tennis shoes which  she will bring in with appropriate padding and metatarsal support.  Meds & Orders: No orders of the defined types were placed in this encounter.   Orders Placed This Encounter  Procedures   XR Foot Complete Left     Procedures: No procedures performed      Clinical History: No specialty comments available.  She reports that she quit smoking about 50 years ago. Her smoking use included cigarettes. She started smoking about 60 years ago. She has a 5 pack-year smoking history. She has never used smokeless tobacco.  Recent Labs    09/15/23 1523  HGBA1C 5.9    Objective:    Physical Exam  Gen: Well-appearing, in no acute distress; non-toxic CV: Well-perfused. Warm.  Resp: Breathing unlabored on room air; no wheezing. Psych: Fluid speech in conversation; appropriate affect; normal thought process  Ortho Exam - Bilateral feet: There is tenderness over the dorsal midfoot and over the plantar aspect of the metatarsals with deep palpation.  There is notable pes planus of bilateral feet with rather significant loss of the transverse arch of the left foot.  There is bilateral lower extremity edema in the setting of venous insufficiency.  Imaging: XR Foot Complete Left Result Date: 01/27/2024 Three-view x-ray of the left foot including AP, lateral and oblique film was ordered and reviewed by myself today.  X-rays demonstrate mild to moderate midfoot  arthritic change, there is early hammertoe deformity.  No acute fracture noted.   Past Medical/Family/Surgical/Social History: Medications & Allergies reviewed per EMR, new medications updated. Patient Active Problem List   Diagnosis Date Noted   Low vitamin B12 level 10/02/2022   Acute cystitis 10/01/2022   Asthma exacerbation 06/11/2022   Hyperkalemia 06/05/2022   Cough 06/05/2022   Peripheral neuropathy 05/18/2022   Stage 3b chronic kidney disease (HCC) 05/17/2022   Low back pain 02/05/2022   Pain in left knee 12/10/2021   Left  hand weakness 11/05/2021   Bilateral primary osteoarthritis of knee 07/23/2021   Lightheadedness 04/30/2021   Pain in right knee 12/12/2020   Anemia 04/09/2020   Carotid arterial disease (HCC) - fu US  due 12/2022 01/23/2019   RBBB (right bundle branch block with left anterior fascicular block)    Weakness of both lower extremities 01/02/2019   Frequent falls 01/02/2019   OSA  06/15/2017   Diabetes (HCC) 03/02/2017   Chronic fatigue 02/23/2017   B12 nutritional deficiency 02/23/2017   Vitamin D  deficiency 12/08/2016   Tubular adenoma of colon 04/29/2016   Essential hypertension 09/12/2015   Allergic rhinitis 07/17/2015   Insomnia 07/17/2015   Obese 04/10/2015   S/P left TKA 04/09/2015   Osteopenia 12/04/2012   Tremor 11/18/2009   RESTLESS LEG SYNDROME 02/11/2009   LEG EDEMA, BILATERAL 02/11/2009   Hyperlipidemia 10/30/2008   Urinary incontinence 10/30/2008   Skin cancer 10/30/2008   Asthma 11/21/2007   Venous (peripheral) insufficiency 11/22/2006   Past Medical History:  Diagnosis Date   Allergic rhinitis    Apnea    Arthritis    Asthma    very mild   Back pain    Bilateral lower extremity edema    Carotid artery disease (HCC)    a. Carotid duplex 03/2016 - duplex was stable, 40-59% RICA, 1-39% LICA   Complication of anesthesia    post-op delirium   Endometrial polyp    Essential hypertension    GERD (gastroesophageal reflux disease)    Heart murmur    per pt   History of adenomatous polyp of colon    04-24-2016  tubular adenoma   History of squamous cell carcinoma excision    pilonidal area second excision 02-02-2002   HTN (hypertension)    Hyperlipidemia    Leg pain    OAB (overactive bladder)    RA (rheumatoid arthritis) (HCC)    RBBB (right bundle branch block with left anterior fascicular block)    RLS (restless legs syndrome)    Shortness of breath on exertion    SUI (stress urinary incontinence, female)    Venous insufficiency    legs   Vitamin D   deficiency    Family History  Problem Relation Age of Onset   Esophageal cancer Father    Heart disease Father        Unknown what kind   Hyperlipidemia Father    Hypertension Father    Cancer Father    Heart failure Father    Hyperlipidemia Mother    Hypertension Mother    CAD Sister        10 years younger than patient - had stent. Was told that her anorexia/bulimia may have played a role   Stroke Maternal Grandmother 31   Diabetes Maternal Grandmother    Heart attack Maternal Grandfather 29   COPD Maternal Grandfather    Heart attack Paternal Grandmother        6s   Diabetes Other  MGGM   Past Surgical History:  Procedure Laterality Date   APPENDECTOMY  07-02-2005   dr weatherly   open   CARDIAC CATHETERIZATION  01-23-2009  dr wolm brodie   normal coronary arteries and lvf   CARDIOVASCULAR STRESS TEST  03/31/2016   Low risk nuclear study w/ medium defect of mild severity in basal anterior and mid anterior location w/ no evidence ischemia or infarction/  normal LV function and wall motion,  nuclear stress ef 71%   CATARACT EXTRACTION W/ INTRAOCULAR LENS IMPLANT Left 09/2016   COLONOSCOPY  last one 04-24-2016   DRUG INDUCED ENDOSCOPY Bilateral 02/11/2022   Procedure: DRUG INDUCED ENDOSCOPY;  Surgeon: Carlie Clark, MD;  Location: [facility];  Service: ENT;  Laterality: Bilateral;   HYSTEROSCOPY WITH D & C N/A 12/22/2016   Procedure: DILATATION AND CURETTAGE /HYSTEROSCOPY;  Surgeon: Mat Browning, MD;  Location: [facility];  Service: Gynecology;  Laterality: N/A;   IMPLANTATION OF HYPOGLOSSAL NERVE STIMULATOR Right 04/21/2022   Procedure: IMPLANTATION OF HYPOGLOSSAL NERVE STIMULATOR;  Surgeon: Carlie Clark, MD;  Location: [facility];  Service: ENT;  Laterality: Right;   IR US  GUIDANCE  09/08/2016   POSTERIOR LUMBAR FUSION  02/ 2018    [facility] ([place], [place])   SKIN CANCER EXCISION     TONSILLECTOMY  child    TOTAL KNEE ARTHROPLASTY Left 04/09/2015   Procedure: LEFT TOTAL KNEE ARTHROPLASTY;  Surgeon: Donnice Car, MD;  Location: [facility];  Service: Orthopedics;  Laterality: Left;   TRANSTHORACIC ECHOCARDIOGRAM  01/21/2009   mild LVH, ef 75-80%, grade 1 diastolic dysfunction   ULNAR NERVE TRANSPOSITION Left 12-20-2008  dr sypher   decompression and partial resection medial triceps fascia   WIDE LOCAL EXCISION PILONIDAL AREA  02-02-2002  dr lorriane   recurrent squamous cell carcinoma in situ   Social History   Occupational History   Occupation: retired Runner, broadcasting/film/video  Tobacco Use   Smoking status: Former    Current packs/day: 0.00    Average packs/day: 0.5 packs/day for 10.0 years (5.0 ttl pk-yrs)    Types: Cigarettes    Start date: 12/11/1963    Quit date: 12/10/1973    Years since quitting: 50.1   Smokeless tobacco: Never  Vaping Use   Vaping status: Never Used  Substance and Sexual Activity   Alcohol use: Yes    Comment: occasional   Drug use: No   Sexual activity: Not on file

## 2024-01-27 NOTE — Progress Notes (Signed)
 Patient says that she has had pain in the left foot for a couple of months. She says that she has swelling at times, which will give her pain in the top of the foot, but she primarily feels it in the toes and ball of the foot. She does not have pain when bearing weight, but does if she moves her foot and toes while seated. She has not taken any medicine specifically for this pain, but she has tried ice which has given her some relief. She denies any numbness, tingling, or burning. She says that her pain does not seem to travel into the arch or heel.

## 2024-01-28 ENCOUNTER — Ambulatory Visit: Payer: Self-pay

## 2024-01-28 NOTE — Telephone Encounter (Signed)
 FYI Only or Action Required?: FYI only for provider.  Patient was last seen in primary care on 09/15/2023 by Geofm Glade PARAS, MD.  Called Nurse Triage reporting Covid Positive.  Symptoms began today.  Interventions attempted: Rest, hydration, or home remedies.  Symptoms are: unchanged.  Triage Disposition: See Physician Within 24 Hours (overriding Call PCP Within 24 Hours)  Patient/caregiver understands and will follow disposition?:    Copied from CRM #[id number]. Topic: Clinical - Red Word Triage >> Jan 28, 2024  4:53 PM Wess RAMAN wrote: Red Word that prompted transfer to Nurse Triage: Patient stated she has tested positive for covid at home and would like medications called in to pharmacy  Symptoms: severe diarrhea, Nausea, lack of Appetite.  Pharmacy: [facility] [id number] - [place], [place] - [address] [place] [address] Phone: [phone] Fax: [phone] Hours: Not open 24 hours Reason for Disposition  [1] HIGH RISK patient (e.g., weak immune system, 65 years and older, obesity with BMI 30 or higher, pregnant, chronic lung disease) AND [2] COVID symptoms (e.g., cough, fever)  (Exceptions: Already seen by PCP and no new or worsening symptoms.)  Answer Assessment - Initial Assessment Questions Additional info: Patient called in requesting medication for Covid 19, her symptoms started today with diarrhea, vomiting and nasal congestion. She has never been infected with Covid 19 to her knowledge. Virtual visit is scheduled on 01/29/24.     1. SYMPTOMS: What is your main symptom or concern? (e.g., cough, fever, shortness of breath, muscle aches)     Diarrhea and vomiting 2. ONSET: When did the symptoms start?      today 3. COUGH: Do you have a cough? If Yes, ask: How bad is the cough?       denies 4. FEVER: Do you have a fever? If Yes, ask: What is your temperature, how was it measured, and when did it start?     denies 5.  BREATHING DIFFICULTY: Are you having any difficulty breathing? (e.g., normal; shortness of breath, wheezing, unable to speak)      denies 6. BETTER-SAME-WORSE: Are you getting better, staying the same or getting worse compared to yesterday?  If getting worse, ask, In what way?     Started today 7. OTHER SYMPTOMS: Do you have any other symptoms?  (e.g., chills, fatigue, headache, loss of smell or taste, muscle pain, sore throat)     Nasal congestion 8. COVID-19 DIAGNOSIS: How do you know that you have COVID? (e.g., positive lab test or self-test, diagnosed by doctor or NP/PA, symptoms after exposure).     Home test resulted positive  9. COVID-19 EXPOSURE: Was there any known exposure to COVID before the symptoms began?      Not disclosed 10. COVID-19 VACCINE: Have you had the COVID-19 vaccine? If Yes, ask: When did you last get it?        11. HIGH RISK DISEASE: Do you have any chronic medical problems? (e.g., asthma, heart or lung disease, weak immune system, obesity, etc.)        12. PREGNANCY: Is there any chance you are pregnant? When was your last menstrual period?        13. O2 SATURATION MONITOR:  Do you use an oxygen saturation monitor (pulse oximeter) at home? If Yes, ask What is your reading (oxygen level) today? What is your usual oxygen saturation reading? (e.g., 95%)  Protocols used: COVID-19 - Diagnosed or Suspected-A-AH

## 2024-01-29 ENCOUNTER — Telehealth

## 2024-02-01 ENCOUNTER — Other Ambulatory Visit: Payer: Self-pay | Admitting: Physical Medicine and Rehabilitation

## 2024-02-01 ENCOUNTER — Other Ambulatory Visit: Payer: Self-pay | Admitting: Internal Medicine

## 2024-02-10 ENCOUNTER — Ambulatory Visit: Admitting: Sports Medicine

## 2024-02-10 DIAGNOSIS — M79672 Pain in left foot: Secondary | ICD-10-CM | POA: Diagnosis not present

## 2024-02-10 DIAGNOSIS — M2022 Hallux rigidus, left foot: Secondary | ICD-10-CM | POA: Diagnosis not present

## 2024-02-10 DIAGNOSIS — M216X1 Other acquired deformities of right foot: Secondary | ICD-10-CM

## 2024-02-10 DIAGNOSIS — M216X2 Other acquired deformities of left foot: Secondary | ICD-10-CM

## 2024-02-10 DIAGNOSIS — M2141 Flat foot [pes planus] (acquired), right foot: Secondary | ICD-10-CM

## 2024-02-10 DIAGNOSIS — M79671 Pain in right foot: Secondary | ICD-10-CM | POA: Diagnosis not present

## 2024-02-10 DIAGNOSIS — M2142 Flat foot [pes planus] (acquired), left foot: Secondary | ICD-10-CM | POA: Diagnosis not present

## 2024-02-10 DIAGNOSIS — M2021 Hallux rigidus, right foot: Secondary | ICD-10-CM | POA: Diagnosis not present

## 2024-02-10 NOTE — Progress Notes (Unsigned)
 BRONWEN PENDERGRAFT - 83 y.o. female MRN [id number]  Date of birth: 09/07/1940  Office Visit Note: Visit Date: 02/10/2024 PCP: Geofm Glade PARAS, MD Referred by: Geofm Glade PARAS, MD  Subjective: Chief Complaint  Patient presents with   Left Foot - Follow-up   HPI: SHELANDA DUVALL is a pleasant 83 y.o. female who presents today for chronic left > right foot pain. Also painful great toes.  She has been dealing with pain in the left foot for few months.  She does have pain in both feet near the midfoot as well that occasionally radiates into the toes.  Here more recently her great toes have been bothering her, she states this happens intermittently and will be quite painful when they are irritated but is not consistently painful.  Her son-in-law is an Investment banker, operational and did believe she had hallux rigidus.  She is a today as well to have us  build her some custom insoles to help support her foot shape.  Pertinent ROS were reviewed with the patient and found to be negative unless otherwise specified above in HPI.   Assessment & Plan: Visit Diagnoses:  1. Bilateral foot pain   2. Pes planus of both feet   3. Loss of transverse plantar arch, left   4. Loss of transverse plantar arch, right   5. Hallux rigidus, left foot   6. Hallux rigidus, right foot    Plan: Impression is chronic left > right foot pain with evidence of metatarsalgia and advanced transverse arch loss in the setting of her bilateral pes planus.  We did fit her for green sports insoles today with scaphoid padding to help support the longitudinal arch as well as a medium metatarsal pad to support the transverse arch loss.  She did ambulate in these and felt more comfortable/supportive.  We could consider metatarsal cookie if the metatarsal pad is not providing her the relief we wish.  Did discuss her great toe pain which has early arthritic changes and signs and symptoms of hallux rigidus.  Okay for over-the-counter  anti-inflammatories when this flares up.  Could consider blue EVA first ray padding in the future if necessary.  Also discussed ultrasound-guided first MTP joint injections if pain gets severe.  She will keep me updated how she is doing and follow-up as needed.  Follow-up: Return if symptoms worsen or fail to improve.   Meds & Orders: No orders of the defined types were placed in this encounter.  No orders of the defined types were placed in this encounter.    Procedures: No procedures performed      Clinical History: No specialty comments available.  She reports that she quit smoking about 50 years ago. Her smoking use included cigarettes. She started smoking about 60 years ago. She has a 5 pack-year smoking history. She has never used smokeless tobacco.  Recent Labs    09/15/23 1523  HGBA1C 5.9    Objective:    Physical Exam  Gen: Well-appearing, in no acute distress; non-toxic CV: Well-perfused. Warm.  Resp: Breathing unlabored on room air; no wheezing. Psych: Fluid speech in conversation; appropriate affect; normal thought process  Ortho Exam - Bilateral feet: Positive TTP over the metatarsal region left greater than right foot.  There is significant pes planus bilaterally with advanced loss of transverse arch of the left and mild-moderate of the right foot.  There is trace to 1+ pedal edema bilaterally in the setting of venous insufficiency.  There is pain upon  manipulation of the left first MTP joints with mild restriction in motion.  Hallux rigidus noted more notably on the contralateral right foot today.  Imaging: No results found.  Past Medical/Family/Surgical/Social History: Medications & Allergies reviewed per EMR, new medications updated. Patient Active Problem List   Diagnosis Date Noted   Low vitamin B12 level 10/02/2022   Acute cystitis 10/01/2022   Asthma exacerbation 06/11/2022   Hyperkalemia 06/05/2022   Cough 06/05/2022   Peripheral neuropathy 05/18/2022    Stage 3b chronic kidney disease (HCC) 05/17/2022   Low back pain 02/05/2022   Pain in left knee 12/10/2021   Left hand weakness 11/05/2021   Bilateral primary osteoarthritis of knee 07/23/2021   Lightheadedness 04/30/2021   Pain in right knee 12/12/2020   Anemia 04/09/2020   Carotid arterial disease (HCC) - fu US  due 12/2022 01/23/2019   RBBB (right bundle branch block with left anterior fascicular block)    Weakness of both lower extremities 01/02/2019   Frequent falls 01/02/2019   OSA  06/15/2017   Diabetes (HCC) 03/02/2017   Chronic fatigue 02/23/2017   B12 nutritional deficiency 02/23/2017   Vitamin D  deficiency 12/08/2016   Tubular adenoma of colon 04/29/2016   Essential hypertension 09/12/2015   Allergic rhinitis 07/17/2015   Insomnia 07/17/2015   Obese 04/10/2015   S/P left TKA 04/09/2015   Osteopenia 12/04/2012   Tremor 11/18/2009   RESTLESS LEG SYNDROME 02/11/2009   LEG EDEMA, BILATERAL 02/11/2009   Hyperlipidemia 10/30/2008   Urinary incontinence 10/30/2008   Skin cancer 10/30/2008   Asthma 11/21/2007   Venous (peripheral) insufficiency 11/22/2006   Past Medical History:  Diagnosis Date   Allergic rhinitis    Apnea    Arthritis    Asthma    very mild   Back pain    Bilateral lower extremity edema    Carotid artery disease    a. Carotid duplex 03/2016 - duplex was stable, 40-59% RICA, 1-39% LICA   Complication of anesthesia    post-op delirium   Endometrial polyp    Essential hypertension    GERD (gastroesophageal reflux disease)    Heart murmur    per pt   History of adenomatous polyp of colon    04-24-2016  tubular adenoma   History of squamous cell carcinoma excision    pilonidal area second excision 02-02-2002   HTN (hypertension)    Hyperlipidemia    Leg pain    OAB (overactive bladder)    RA (rheumatoid arthritis) (HCC)    RBBB (right bundle branch block with left anterior fascicular block)    RLS (restless legs syndrome)    Shortness of  breath on exertion    SUI (stress urinary incontinence, female)    Venous insufficiency    legs   Vitamin D  deficiency    Family History  Problem Relation Age of Onset   Esophageal cancer Father    Heart disease Father        Unknown what kind   Hyperlipidemia Father    Hypertension Father    Cancer Father    Heart failure Father    Hyperlipidemia Mother    Hypertension Mother    CAD Sister        10 years younger than patient - had stent. Was told that her anorexia/bulimia may have played a role   Stroke Maternal Grandmother 65   Diabetes Maternal Grandmother    Heart attack Maternal Grandfather 47   COPD Maternal Grandfather    Heart attack Paternal  Grandmother        70s   Diabetes Other        MGGM   Past Surgical History:  Procedure Laterality Date   APPENDECTOMY  07-02-2005   dr weatherly   open   CARDIAC CATHETERIZATION  01-23-2009  dr wolm brodie   normal coronary arteries and lvf   CARDIOVASCULAR STRESS TEST  03/31/2016   Low risk nuclear study w/ medium defect of mild severity in basal anterior and mid anterior location w/ no evidence ischemia or infarction/  normal LV function and wall motion,  nuclear stress ef 71%   CATARACT EXTRACTION W/ INTRAOCULAR LENS IMPLANT Left 09/2016   COLONOSCOPY  last one 04-24-2016   DRUG INDUCED ENDOSCOPY Bilateral 02/11/2022   Procedure: DRUG INDUCED ENDOSCOPY;  Surgeon: Carlie Clark, MD;  Location: [facility];  Service: ENT;  Laterality: Bilateral;   HYSTEROSCOPY WITH D & C N/A 12/22/2016   Procedure: DILATATION AND CURETTAGE /HYSTEROSCOPY;  Surgeon: Mat Browning, MD;  Location: [facility];  Service: Gynecology;  Laterality: N/A;   IMPLANTATION OF HYPOGLOSSAL NERVE STIMULATOR Right 04/21/2022   Procedure: IMPLANTATION OF HYPOGLOSSAL NERVE STIMULATOR;  Surgeon: Carlie Clark, MD;  Location: [facility];  Service: ENT;  Laterality: Right;   IR US  GUIDANCE  09/08/2016   POSTERIOR  LUMBAR FUSION  02/ 2018    [facility] ([place], [place])   SKIN CANCER EXCISION     TONSILLECTOMY  child   TOTAL KNEE ARTHROPLASTY Left 04/09/2015   Procedure: LEFT TOTAL KNEE ARTHROPLASTY;  Surgeon: Donnice Car, MD;  Location: [facility];  Service: Orthopedics;  Laterality: Left;   TRANSTHORACIC ECHOCARDIOGRAM  01/21/2009   mild LVH, ef 75-80%, grade 1 diastolic dysfunction   ULNAR NERVE TRANSPOSITION Left 12-20-2008  dr sypher   decompression and partial resection medial triceps fascia   WIDE LOCAL EXCISION PILONIDAL AREA  02-02-2002  dr lorriane   recurrent squamous cell carcinoma in situ   Social History   Occupational History   Occupation: retired Runner, broadcasting/film/video  Tobacco Use   Smoking status: Former    Current packs/day: 0.00    Average packs/day: 0.5 packs/day for 10.0 years (5.0 ttl pk-yrs)    Types: Cigarettes    Start date: 12/11/1963    Quit date: 12/10/1973    Years since quitting: 50.2   Smokeless tobacco: Never  Vaping Use   Vaping status: Never Used  Substance and Sexual Activity   Alcohol use: Yes    Comment: occasional   Drug use: No   Sexual activity: Not on file

## 2024-02-11 ENCOUNTER — Encounter: Admitting: Internal Medicine

## 2024-02-11 ENCOUNTER — Encounter: Payer: Self-pay | Admitting: Sports Medicine

## 2024-02-14 ENCOUNTER — Other Ambulatory Visit: Payer: Self-pay | Admitting: Internal Medicine

## 2024-02-17 ENCOUNTER — Other Ambulatory Visit: Payer: Self-pay

## 2024-02-17 ENCOUNTER — Other Ambulatory Visit: Payer: Self-pay | Admitting: Internal Medicine

## 2024-02-17 ENCOUNTER — Other Ambulatory Visit: Payer: Self-pay | Admitting: Physical Medicine and Rehabilitation

## 2024-02-18 MED ORDER — EZETIMIBE 10 MG PO TABS: 10.0000 mg | ORAL_TABLET | Freq: Every day | ORAL | 0 refills | Status: DC

## 2024-02-25 DIAGNOSIS — E1142 Type 2 diabetes mellitus with diabetic polyneuropathy: Secondary | ICD-10-CM | POA: Diagnosis not present

## 2024-02-25 DIAGNOSIS — G894 Chronic pain syndrome: Secondary | ICD-10-CM | POA: Diagnosis not present

## 2024-02-25 DIAGNOSIS — G2581 Restless legs syndrome: Secondary | ICD-10-CM | POA: Diagnosis not present

## 2024-02-28 DIAGNOSIS — G4736 Sleep related hypoventilation in conditions classified elsewhere: Secondary | ICD-10-CM | POA: Diagnosis not present

## 2024-02-28 DIAGNOSIS — G4733 Obstructive sleep apnea (adult) (pediatric): Secondary | ICD-10-CM | POA: Diagnosis not present

## 2024-03-06 ENCOUNTER — Ambulatory Visit

## 2024-03-06 DIAGNOSIS — B351 Tinea unguium: Secondary | ICD-10-CM | POA: Diagnosis not present

## 2024-03-06 DIAGNOSIS — L603 Nail dystrophy: Secondary | ICD-10-CM | POA: Diagnosis not present

## 2024-03-06 DIAGNOSIS — L84 Corns and callosities: Secondary | ICD-10-CM | POA: Diagnosis not present

## 2024-03-06 DIAGNOSIS — I739 Peripheral vascular disease, unspecified: Secondary | ICD-10-CM | POA: Diagnosis not present

## 2024-03-09 ENCOUNTER — Encounter: Payer: Self-pay | Admitting: Internal Medicine

## 2024-03-09 NOTE — Progress Notes (Signed)
 Subjective:    Patient ID: Erin Roach, female    DOB: 1940/10/16, 83 y.o.   MRN: [id number]     HPI Erin Roach is here for follow up of her chronic medical problems.  1 month - she noticed a lump in her upper chest - no pain.  Has not gotten larger.    Last mammo 9 month ago and was normal.  Medications and allergies reviewed with patient and updated if appropriate.  Current Outpatient Medications on File Prior to Visit  Medication Sig Dispense Refill   amitriptyline  (ELAVIL ) 10 MG tablet TAKE 1 TABLET BY MOUTH AT BEDTIME 30 tablet 1   aspirin  EC 81 MG tablet Take 1 tablet (81 mg total) by mouth daily. Swallow whole. 30 tablet 12   cholecalciferol (VITAMIN D3) 25 MCG (1000 UNIT) tablet Take 1,000 Units by mouth daily.     co-enzyme Q-10 30 MG capsule Take 30 mg by mouth daily.     DULoxetine  (CYMBALTA ) 20 MG capsule Take 1 capsule (20 mg total) by mouth daily. 90 capsule 3   ezetimibe  (ZETIA ) 10 MG tablet Take 1 tablet (10 mg total) by mouth daily. 30 tablet 0   fluticasone  (FLONASE ) 50 MCG/ACT nasal spray PLACE 1 SPRAY INTO EACH NOSTRIL ONCE DAILY 16 mL 11   furosemide  (LASIX ) 20 MG tablet TAKE 2 TABLETS BY MOUTH DAILY 180 tablet 1   HORIZANT  600 MG TBCR TAKE 1 TABLET BY MOUTH AT BEDTIME 90 tablet 0   hydrALAZINE  (APRESOLINE ) 25 MG tablet Take 1 tablet (25 mg total) by mouth 3 (three) times daily. 90 tablet 3   lidocaine  (LIDODERM ) 5 % APPLY 1 PATCH TO AFFECTED AREA FOR 12 HOURS IN A 24 HOUR PERIOD 30 patch 0   losartan  (COZAAR ) 50 MG tablet Take 1 tablet (50 mg total) by mouth daily. 90 tablet 1   magnesium  gluconate (MAGONATE) 500 MG tablet Take 500 mg by mouth 2 (two) times daily.     methadone (DOLOPHINE) 5 MG tablet Patient takes half a 0.5 in the morning and one at night     MOUNJARO  5 MG/0.5ML Pen INJECT 5 MG UNDER THE SKIN ONCE WEEKLY 2 mL 0   pregabalin  (LYRICA ) 75 MG capsule TAKE 1 CAPSULE BY MOUTH EVERY AFTERNOON AND TAKE 2 CAPSULES BY MOUTH EVERY NIGHT AT BEDTIME  270 capsule 3   rOPINIRole  (REQUIP ) 3 MG tablet TAKE 1 TABLET BY MOUTH AT BEDTIME 90 tablet 3   simvastatin  (ZOCOR ) 40 MG tablet TAKE 1 TABLET BY MOUTH EVERY NIGHT AT BEDTIME 90 tablet 1   spironolactone  (ALDACTONE ) 25 MG tablet TAKE 1 TABLET BY MOUTH DAILY 90 tablet 1   No current facility-administered medications on file prior to visit.     Review of Systems  Constitutional:  Negative for fever.  Respiratory:  Negative for cough, shortness of breath and wheezing.   Cardiovascular:  Positive for leg swelling (wearing support hose). Negative for chest pain and palpitations.  Neurological:  Positive for tremors. Negative for light-headedness and headaches.       Objective:   Vitals:   03/10/24 1440  BP: 132/78  Pulse: 78  Temp: 98.5 F (36.9 C)  SpO2: 95%   BP Readings from Last 3 Encounters:  03/10/24 132/78  09/15/23 132/82  08/16/23 (!) 96/53   Wt Readings from Last 3 Encounters:  03/10/24 204 lb (92.5 kg)  09/15/23 221 lb (100.2 kg)  08/16/23 213 lb (96.6 kg)   Body mass index is  31.02 kg/m.    Physical Exam Constitutional:      General: She is not in acute distress.    Appearance: Normal appearance.  HENT:     Head: Normocephalic and atraumatic.  Eyes:     Conjunctiva/sclera: Conjunctivae normal.  Cardiovascular:     Rate and Rhythm: Normal rate and regular rhythm.     Heart sounds: Normal heart sounds.  Pulmonary:     Effort: Pulmonary effort is normal. No respiratory distress.     Breath sounds: Normal breath sounds. No wheezing.  Musculoskeletal:     Cervical back: Neck supple.     Right lower leg: No edema.     Left lower leg: No edema.  Lymphadenopathy:     Cervical: No cervical adenopathy.  Skin:    General: Skin is warm and dry.     Findings: No rash.  Neurological:     Mental Status: She is alert. Mental status is at baseline.  Psychiatric:        Mood and Affect: Mood normal.        Behavior: Behavior normal.        Lab Results   Component Value Date   WBC 5.9 09/15/2023   HGB 12.1 09/15/2023   HCT 35.3 (L) 09/15/2023   PLT 225.0 09/15/2023   GLUCOSE 88 09/15/2023   CHOL 145 09/15/2023   TRIG 169.0 (H) 09/15/2023   HDL 43.80 09/15/2023   LDLDIRECT 110.0 05/18/2022   LDLCALC 67 09/15/2023   ALT 22 09/15/2023   AST 18 09/15/2023   NA 138 09/15/2023   K 5.5 No hemolysis seen (H) 09/15/2023   CL 106 09/15/2023   CREATININE 1.22 (H) 09/15/2023   BUN 20 09/15/2023   CO2 25 09/15/2023   TSH 2.281 09/26/2022   INR 1.03 03/27/2015   HGBA1C 5.9 09/15/2023   MICROALBUR 2.0 (H) 09/15/2023     Assessment & Plan:    See Problem List for Assessment and Plan of chronic medical problems.

## 2024-03-09 NOTE — Patient Instructions (Addendum)
      Blood work was ordered.       Medications changes include :   start pranprolol 40 mg twice daily and hold losartan .  Monitor your BP.      A chest Ultrasound was ordered and someone will call you to schedule an appointment.     Return in about 6 months (around 09/07/2024) for Physical Exam.

## 2024-03-10 ENCOUNTER — Ambulatory Visit: Admitting: Internal Medicine

## 2024-03-10 VITALS — BP 132/78 | HR 78 | Temp 98.5°F | Ht 68.0 in | Wt 204.0 lb

## 2024-03-10 DIAGNOSIS — G250 Essential tremor: Secondary | ICD-10-CM

## 2024-03-10 DIAGNOSIS — G2581 Restless legs syndrome: Secondary | ICD-10-CM

## 2024-03-10 DIAGNOSIS — E559 Vitamin D deficiency, unspecified: Secondary | ICD-10-CM

## 2024-03-10 DIAGNOSIS — E538 Deficiency of other specified B group vitamins: Secondary | ICD-10-CM | POA: Diagnosis not present

## 2024-03-10 DIAGNOSIS — E1159 Type 2 diabetes mellitus with other circulatory complications: Secondary | ICD-10-CM | POA: Diagnosis not present

## 2024-03-10 DIAGNOSIS — N1832 Chronic kidney disease, stage 3b: Secondary | ICD-10-CM

## 2024-03-10 DIAGNOSIS — I152 Hypertension secondary to endocrine disorders: Secondary | ICD-10-CM | POA: Diagnosis not present

## 2024-03-10 DIAGNOSIS — I872 Venous insufficiency (chronic) (peripheral): Secondary | ICD-10-CM

## 2024-03-10 DIAGNOSIS — G4733 Obstructive sleep apnea (adult) (pediatric): Secondary | ICD-10-CM

## 2024-03-10 DIAGNOSIS — G6289 Other specified polyneuropathies: Secondary | ICD-10-CM

## 2024-03-10 DIAGNOSIS — E66811 Obesity, class 1: Secondary | ICD-10-CM

## 2024-03-10 DIAGNOSIS — E1122 Type 2 diabetes mellitus with diabetic chronic kidney disease: Secondary | ICD-10-CM

## 2024-03-10 DIAGNOSIS — R222 Localized swelling, mass and lump, trunk: Secondary | ICD-10-CM

## 2024-03-10 DIAGNOSIS — E1169 Type 2 diabetes mellitus with other specified complication: Secondary | ICD-10-CM | POA: Diagnosis not present

## 2024-03-10 DIAGNOSIS — I5032 Chronic diastolic (congestive) heart failure: Secondary | ICD-10-CM

## 2024-03-10 DIAGNOSIS — E785 Hyperlipidemia, unspecified: Secondary | ICD-10-CM

## 2024-03-10 DIAGNOSIS — M85861 Other specified disorders of bone density and structure, right lower leg: Secondary | ICD-10-CM

## 2024-03-10 DIAGNOSIS — R7989 Other specified abnormal findings of blood chemistry: Secondary | ICD-10-CM

## 2024-03-10 LAB — CBC WITH DIFFERENTIAL/PLATELET
Basophils Absolute: 0.1 K/uL (ref 0.0–0.1)
Basophils Relative: 1 % (ref 0.0–3.0)
Eosinophils Absolute: 0.2 K/uL (ref 0.0–0.7)
Eosinophils Relative: 3.1 % (ref 0.0–5.0)
HCT: 35.3 % — ABNORMAL LOW (ref 36.0–46.0)
Hemoglobin: 12 g/dL (ref 12.0–15.0)
Lymphocytes Relative: 19.3 % (ref 12.0–46.0)
Lymphs Abs: 1.4 K/uL (ref 0.7–4.0)
MCHC: 34.1 g/dL (ref 30.0–36.0)
MCV: 91 fl (ref 78.0–100.0)
Monocytes Absolute: 0.6 K/uL (ref 0.1–1.0)
Monocytes Relative: 8.6 % (ref 3.0–12.0)
Neutro Abs: 4.9 K/uL (ref 1.4–7.7)
Neutrophils Relative %: 68 % (ref 43.0–77.0)
Platelets: 222 K/uL (ref 150.0–400.0)
RBC: 3.88 Mil/uL (ref 3.87–5.11)
RDW: 13 % (ref 11.5–15.5)
WBC: 7.2 K/uL (ref 4.0–10.5)

## 2024-03-10 LAB — HEMOGLOBIN A1C: Hgb A1c MFr Bld: 5.7 % (ref 4.6–6.5)

## 2024-03-10 LAB — COMPREHENSIVE METABOLIC PANEL WITH GFR
ALT: 15 U/L (ref 0–35)
AST: 16 U/L (ref 0–37)
Albumin: 4.1 g/dL (ref 3.5–5.2)
Alkaline Phosphatase: 71 U/L (ref 39–117)
BUN: 19 mg/dL (ref 6–23)
CO2: 25 meq/L (ref 19–32)
Calcium: 8.9 mg/dL (ref 8.4–10.5)
Chloride: 106 meq/L (ref 96–112)
Creatinine, Ser: 1.21 mg/dL — ABNORMAL HIGH (ref 0.40–1.20)
GFR: 42.13 mL/min — ABNORMAL LOW (ref >60.00)
Glucose, Bld: 78 mg/dL (ref 70–99)
Potassium: 4.9 meq/L (ref 3.5–5.1)
Sodium: 138 meq/L (ref 135–145)
Total Bilirubin: 0.3 mg/dL (ref 0.2–1.2)
Total Protein: 6.7 g/dL (ref 6.0–8.3)

## 2024-03-10 LAB — VITAMIN B12: Vitamin B-12: 943 pg/mL — ABNORMAL HIGH (ref 211–911)

## 2024-03-10 LAB — LIPID PANEL
Cholesterol: 132 mg/dL (ref 0–200)
HDL: 44 mg/dL (ref >39.00)
LDL Cholesterol: 52 mg/dL (ref 0–99)
NonHDL: 88.28
Total CHOL/HDL Ratio: 3
Triglycerides: 183 mg/dL — ABNORMAL HIGH (ref 0.0–149.0)
VLDL: 36.6 mg/dL (ref 0.0–40.0)

## 2024-03-10 LAB — VITAMIN D 25 HYDROXY (VIT D DEFICIENCY, FRACTURES): VITD: 34.72 ng/mL (ref 30.00–100.00)

## 2024-03-10 MED ORDER — PROPRANOLOL HCL 40 MG PO TABS: 40.0000 mg | ORAL_TABLET | Freq: Two times a day (BID) | ORAL | 5 refills | Status: AC

## 2024-03-10 NOTE — Assessment & Plan Note (Signed)
Chronic Check b12 level

## 2024-03-10 NOTE — Assessment & Plan Note (Signed)
Chronic Did not tolerate CPAP Had inspire placed 04/21/2022

## 2024-03-10 NOTE — Assessment & Plan Note (Signed)
Chronic ?Check vitamin d level ?

## 2024-03-10 NOTE — Assessment & Plan Note (Signed)
Chronic Check lipid panel, CMP Continue simvastatin 40 mg daily

## 2024-03-10 NOTE — Assessment & Plan Note (Signed)
 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

## 2024-03-10 NOTE — Assessment & Plan Note (Addendum)
 Chronic Management per Dr. Jacques at [facility] On methadone, Requip ,horizont, Lyrica , duloxetine 

## 2024-03-10 NOTE — Assessment & Plan Note (Signed)
Chronic Wears compression socks most days, which does help-encourage wearing them on a daily basis Continue Lasix 40 mg daily CMP

## 2024-03-10 NOTE — Assessment & Plan Note (Addendum)
 Chronic Management per neurology-Dr. Maylon Currently on Lyrica  75 mg in the afternoon and 150 mg at night, Horizant  600 mg at bedtime, amitriptyline  10 mg at bedtime and duloxetine  20 mg daily

## 2024-03-10 NOTE — Assessment & Plan Note (Addendum)
 [place] Did see neurology-Dr. Tat several years ago intermittent-it has gotten worse and she would be interested in trying medication that would help Start propranolol 40 mg twice daily-Will have to discontinue losartan  Monitor BP

## 2024-03-10 NOTE — Assessment & Plan Note (Signed)
Chronic ?Taking B12 regularly ?Check B12 level ?

## 2024-03-10 NOTE — Assessment & Plan Note (Addendum)
 Chronic Blood pressure controlled CMP, CBC Will start propranolol 40 mg twice daily for essential tremor Discontinue losartan  Continue hydralazine  25 mg 3 times daily and spironolactone  25 mg daily Revised medications based on the response to tremor and blood pressure readings Monitor BP at home

## 2024-03-10 NOTE — Assessment & Plan Note (Signed)
 Chronic DEXA up-to-date Will check vitamin D  level Encouraged to be as active as possible with chronic back pain

## 2024-03-12 ENCOUNTER — Other Ambulatory Visit: Payer: Self-pay | Admitting: Internal Medicine

## 2024-03-12 ENCOUNTER — Other Ambulatory Visit: Payer: Self-pay | Admitting: Cardiovascular Disease

## 2024-03-12 ENCOUNTER — Ambulatory Visit: Payer: Self-pay | Admitting: Internal Medicine

## 2024-03-12 DIAGNOSIS — R222 Localized swelling, mass and lump, trunk: Secondary | ICD-10-CM | POA: Insufficient documentation

## 2024-03-12 DIAGNOSIS — I5032 Chronic diastolic (congestive) heart failure: Secondary | ICD-10-CM | POA: Insufficient documentation

## 2024-03-12 NOTE — Assessment & Plan Note (Signed)
 Chronic Stage 3b - stable Avoid NSAIDs Maintain good water intake Blood pressure controlled

## 2024-03-12 NOTE — Assessment & Plan Note (Signed)
 New Noticed a lump in the skin of her upper chest Does not seem to be in the breast tissue-seems to be in the dermal layer Ultrasound of soft tissue of chest ordered

## 2024-03-12 NOTE — Assessment & Plan Note (Signed)
 Chronic-follow cardiology Euvolemic on exam Continue Lasix  40 mg daily

## 2024-03-12 NOTE — Assessment & Plan Note (Signed)
 Chronic BMI 31.02 Difficulty exercising secondary to neuropathy and chronic back pain Weight has decreased-improved sleep will likely help Continue weight loss efforts

## 2024-03-13 ENCOUNTER — Encounter: Payer: Self-pay | Admitting: Radiology

## 2024-03-14 ENCOUNTER — Other Ambulatory Visit: Payer: Self-pay | Admitting: Cardiovascular Disease

## 2024-03-15 ENCOUNTER — Ambulatory Visit: Admitting: Internal Medicine

## 2024-03-19 ENCOUNTER — Other Ambulatory Visit: Payer: Self-pay | Admitting: Cardiovascular Disease

## 2024-03-19 ENCOUNTER — Other Ambulatory Visit: Payer: Self-pay | Admitting: Internal Medicine

## 2024-03-20 ENCOUNTER — Encounter: Payer: Self-pay | Admitting: Pharmacist

## 2024-03-20 NOTE — Progress Notes (Signed)
 Pharmacy Quality Measure Review  This patient is appearing on a report for being at risk of failing the adherence measure for cholesterol (statin) and diabetes medications this calendar year.   Medication: Mounjaro  Last fill date: 03/13/24 for 28 day supply  Medication: Simvastatin  Last fill date: 01/24/24 for 90 day supply  Insurance report was not up to date. No action needed at this time.   Darrelyn Drum, PharmD, BCPS, CPP Clinical Pharmacist Practitioner [facility] [phone]

## 2024-03-29 ENCOUNTER — Encounter: Payer: Self-pay | Admitting: Internal Medicine

## 2024-03-29 ENCOUNTER — Other Ambulatory Visit: Payer: Self-pay | Admitting: Physical Medicine and Rehabilitation

## 2024-04-04 ENCOUNTER — Other Ambulatory Visit: Payer: Self-pay | Admitting: Cardiovascular Disease

## 2024-04-10 ENCOUNTER — Other Ambulatory Visit: Payer: Self-pay | Admitting: Cardiovascular Disease

## 2024-04-10 MED ORDER — EZETIMIBE 10 MG PO TABS: 10.0000 mg | ORAL_TABLET | Freq: Every day | ORAL | 0 refills | Status: AC

## 2024-04-11 ENCOUNTER — Ambulatory Visit (HOSPITAL_BASED_OUTPATIENT_CLINIC_OR_DEPARTMENT_OTHER)
Admission: RE | Admit: 2024-04-11 | Discharge: 2024-04-11 | Disposition: A | Source: Ambulatory Visit | Attending: Internal Medicine

## 2024-04-11 DIAGNOSIS — R222 Localized swelling, mass and lump, trunk: Secondary | ICD-10-CM | POA: Insufficient documentation

## 2024-04-12 ENCOUNTER — Other Ambulatory Visit: Payer: Self-pay | Admitting: Internal Medicine

## 2024-04-13 ENCOUNTER — Other Ambulatory Visit: Payer: Self-pay | Admitting: Physical Medicine and Rehabilitation

## 2024-04-13 ENCOUNTER — Other Ambulatory Visit: Payer: Self-pay | Admitting: Internal Medicine

## 2024-04-19 DIAGNOSIS — Z4542 Encounter for adjustment and management of neuropacemaker (brain) (peripheral nerve) (spinal cord): Secondary | ICD-10-CM | POA: Diagnosis not present

## 2024-04-19 DIAGNOSIS — G4733 Obstructive sleep apnea (adult) (pediatric): Secondary | ICD-10-CM | POA: Diagnosis not present

## 2024-04-22 ENCOUNTER — Other Ambulatory Visit: Payer: Self-pay | Admitting: Internal Medicine

## 2024-04-22 ENCOUNTER — Other Ambulatory Visit: Payer: Self-pay | Admitting: Cardiovascular Disease

## 2024-05-02 NOTE — Progress Notes (Signed)
 Pharmacy Quality Measure Review  This patient is appearing on a report for being at risk of failing the adherence measure for diabetes medications this calendar year.   Medication: Mounjaro  5 mg Last fill date: 04/12/2024 for 28 day supply  Insurance report was not up to date. No action needed at this time.   Woodie Jock, PharmD PGY1 Pharmacy Resident  05/02/2024

## 2024-05-08 ENCOUNTER — Other Ambulatory Visit: Payer: Self-pay | Admitting: Cardiovascular Disease

## 2024-05-08 ENCOUNTER — Other Ambulatory Visit: Payer: Self-pay | Admitting: Internal Medicine

## 2024-05-09 NOTE — Telephone Encounter (Signed)
 Pt of Dr. Francyne. Passed 3rd attempt. Does Dr. Francyne want to refill? Please advise.

## 2024-05-30 ENCOUNTER — Other Ambulatory Visit: Payer: Self-pay | Admitting: Physical Medicine and Rehabilitation

## 2024-06-03 ENCOUNTER — Other Ambulatory Visit: Payer: Self-pay | Admitting: Internal Medicine

## 2024-06-10 ENCOUNTER — Other Ambulatory Visit: Payer: Self-pay | Admitting: Internal Medicine
